# Patient Record
Sex: Male | Born: 1960
Health system: Southern US, Community
[De-identification: ages and names within clinical notes are randomized; demographics above are authoritative.]

## PROBLEM LIST (undated history)

## (undated) DIAGNOSIS — C801 Malignant (primary) neoplasm, unspecified: Secondary | ICD-10-CM

## (undated) DIAGNOSIS — R0600 Dyspnea, unspecified: Secondary | ICD-10-CM

## (undated) DIAGNOSIS — E042 Nontoxic multinodular goiter: Secondary | ICD-10-CM

## (undated) DIAGNOSIS — E119 Type 2 diabetes mellitus without complications: Secondary | ICD-10-CM

## (undated) DIAGNOSIS — I82409 Acute embolism and thrombosis of unspecified deep veins of unspecified lower extremity: Secondary | ICD-10-CM

## (undated) DIAGNOSIS — E785 Hyperlipidemia, unspecified: Secondary | ICD-10-CM

## (undated) DIAGNOSIS — R600 Localized edema: Secondary | ICD-10-CM

## (undated) DIAGNOSIS — D751 Secondary polycythemia: Secondary | ICD-10-CM

## (undated) DIAGNOSIS — M1990 Unspecified osteoarthritis, unspecified site: Secondary | ICD-10-CM

## (undated) DIAGNOSIS — I87009 Postthrombotic syndrome without complications of unspecified extremity: Secondary | ICD-10-CM

## (undated) DIAGNOSIS — I10 Essential (primary) hypertension: Secondary | ICD-10-CM

## (undated) DIAGNOSIS — D6861 Antiphospholipid syndrome: Secondary | ICD-10-CM

## (undated) HISTORY — DX: Hyperlipidemia, unspecified: E78.5

## (undated) HISTORY — PX: EYE MUSCLE SURGERY: SHX370

## (undated) MED FILL — Dexamethasone Sodium Phosphate Inj 100 MG/10ML: INTRAMUSCULAR | Qty: 1 | Status: AC

## (undated) MED FILL — Fosaprepitant Dimeglumine For IV Infusion 150 MG (Base Eq): INTRAVENOUS | Qty: 5 | Status: AC

---

## 1999-02-02 ENCOUNTER — Encounter: Payer: Self-pay | Admitting: Emergency Medicine

## 1999-02-02 ENCOUNTER — Inpatient Hospital Stay (HOSPITAL_COMMUNITY): Admission: EM | Admit: 1999-02-02 | Discharge: 1999-02-09 | Payer: Self-pay | Admitting: Emergency Medicine

## 1999-02-04 ENCOUNTER — Encounter: Payer: Self-pay | Admitting: Pulmonary Disease

## 1999-02-06 ENCOUNTER — Encounter: Payer: Self-pay | Admitting: Pulmonary Disease

## 1999-02-16 ENCOUNTER — Emergency Department (HOSPITAL_COMMUNITY): Admission: EM | Admit: 1999-02-16 | Discharge: 1999-02-16 | Payer: Self-pay | Admitting: Internal Medicine

## 1999-02-16 ENCOUNTER — Encounter: Payer: Self-pay | Admitting: Internal Medicine

## 1999-03-24 ENCOUNTER — Ambulatory Visit (HOSPITAL_COMMUNITY): Admission: RE | Admit: 1999-03-24 | Discharge: 1999-03-24 | Payer: Self-pay | Admitting: Gastroenterology

## 1999-03-24 ENCOUNTER — Encounter: Payer: Self-pay | Admitting: Gastroenterology

## 1999-04-04 ENCOUNTER — Ambulatory Visit (HOSPITAL_COMMUNITY): Admission: RE | Admit: 1999-04-04 | Discharge: 1999-04-04 | Payer: Self-pay | Admitting: Gastroenterology

## 1999-04-04 ENCOUNTER — Encounter: Payer: Self-pay | Admitting: Gastroenterology

## 1999-10-12 ENCOUNTER — Inpatient Hospital Stay (HOSPITAL_COMMUNITY): Admission: EM | Admit: 1999-10-12 | Discharge: 1999-10-15 | Payer: Self-pay | Admitting: Emergency Medicine

## 1999-10-14 ENCOUNTER — Encounter: Payer: Self-pay | Admitting: Internal Medicine

## 2001-01-01 ENCOUNTER — Observation Stay (HOSPITAL_COMMUNITY): Admission: RE | Admit: 2001-01-01 | Discharge: 2001-01-02 | Payer: Self-pay | Admitting: Internal Medicine

## 2001-03-20 ENCOUNTER — Encounter: Admission: RE | Admit: 2001-03-20 | Discharge: 2001-05-28 | Payer: Self-pay | Admitting: Unknown Physician Specialty

## 2001-08-05 ENCOUNTER — Ambulatory Visit (HOSPITAL_COMMUNITY): Admission: RE | Admit: 2001-08-05 | Discharge: 2001-08-05 | Payer: Self-pay | Admitting: Internal Medicine

## 2004-09-19 ENCOUNTER — Ambulatory Visit: Payer: Self-pay | Admitting: Internal Medicine

## 2004-10-10 ENCOUNTER — Ambulatory Visit: Payer: Self-pay | Admitting: Cardiology

## 2004-10-11 ENCOUNTER — Ambulatory Visit: Payer: Self-pay | Admitting: Internal Medicine

## 2004-12-06 ENCOUNTER — Ambulatory Visit: Payer: Self-pay | Admitting: Internal Medicine

## 2005-01-25 ENCOUNTER — Ambulatory Visit: Payer: Self-pay | Admitting: Internal Medicine

## 2005-02-22 ENCOUNTER — Ambulatory Visit: Payer: Self-pay | Admitting: Cardiology

## 2005-03-22 ENCOUNTER — Ambulatory Visit: Payer: Self-pay | Admitting: Cardiology

## 2005-08-09 ENCOUNTER — Ambulatory Visit: Payer: Self-pay | Admitting: Cardiology

## 2005-09-18 ENCOUNTER — Ambulatory Visit: Payer: Self-pay | Admitting: Cardiology

## 2008-11-18 DIAGNOSIS — K2100 Gastro-esophageal reflux disease with esophagitis, without bleeding: Secondary | ICD-10-CM | POA: Insufficient documentation

## 2008-11-24 ENCOUNTER — Ambulatory Visit: Payer: Self-pay | Admitting: Family Medicine

## 2008-11-24 DIAGNOSIS — E785 Hyperlipidemia, unspecified: Secondary | ICD-10-CM | POA: Insufficient documentation

## 2008-11-24 DIAGNOSIS — Z8709 Personal history of other diseases of the respiratory system: Secondary | ICD-10-CM | POA: Insufficient documentation

## 2008-11-26 ENCOUNTER — Ambulatory Visit: Payer: Self-pay | Admitting: Family Medicine

## 2008-11-26 LAB — CONVERTED CEMR LAB
ALT: 44 units/L (ref 0–53)
AST: 28 units/L (ref 0–37)
Albumin: 3.9 g/dL (ref 3.5–5.2)
Alkaline Phosphatase: 42 units/L (ref 39–117)
BUN: 9 mg/dL (ref 6–23)
Basophils Absolute: 0 10*3/uL (ref 0.0–0.1)
Basophils Relative: 0.1 % (ref 0.0–3.0)
Bilirubin Urine: NEGATIVE
Bilirubin, Direct: 0.1 mg/dL (ref 0.0–0.3)
Blood in Urine, dipstick: NEGATIVE
CO2: 31 meq/L (ref 19–32)
Calcium: 8.9 mg/dL (ref 8.4–10.5)
Chloride: 104 meq/L (ref 96–112)
Cholesterol: 167 mg/dL (ref 0–200)
Creatinine, Ser: 1.1 mg/dL (ref 0.4–1.5)
Eosinophils Absolute: 0 10*3/uL (ref 0.0–0.7)
Eosinophils Relative: 1.5 % (ref 0.0–5.0)
GFR calc Af Amer: 92 mL/min
GFR calc non Af Amer: 76 mL/min
Glucose, Bld: 115 mg/dL — ABNORMAL HIGH (ref 70–99)
Glucose, Urine, Semiquant: NEGATIVE
HCT: 47.5 % (ref 39.0–52.0)
HDL: 58.8 mg/dL (ref >39.0)
Hemoglobin: 16.4 g/dL (ref 13.0–17.0)
Ketones, urine, test strip: NEGATIVE
LDL Cholesterol: 87 mg/dL (ref 0–99)
Lymphocytes Relative: 17.9 % (ref 12.0–46.0)
MCHC: 34.6 g/dL (ref 30.0–36.0)
MCV: 91.1 fL (ref 78.0–100.0)
Monocytes Absolute: 0.4 10*3/uL (ref 0.1–1.0)
Monocytes Relative: 11.7 % (ref 3.0–12.0)
Neutro Abs: 2.1 10*3/uL (ref 1.4–7.7)
Neutrophils Relative %: 68.8 % (ref 43.0–77.0)
Nitrite: NEGATIVE
PSA: 0.84 ng/mL (ref 0.10–4.00)
Platelets: 180 10*3/uL (ref 150–400)
Potassium: 4.4 meq/L (ref 3.5–5.1)
Protein, U semiquant: NEGATIVE
RBC: 5.21 M/uL (ref 4.22–5.81)
RDW: 12 % (ref 11.5–14.6)
Sodium: 143 meq/L (ref 135–145)
Specific Gravity, Urine: 1.015
TSH: 1.13 microintl units/mL (ref 0.35–5.50)
Total Bilirubin: 0.7 mg/dL (ref 0.3–1.2)
Total CHOL/HDL Ratio: 2.8
Total Protein: 6.4 g/dL (ref 6.0–8.3)
Triglycerides: 108 mg/dL (ref 0–149)
Urobilinogen, UA: 0.2
VLDL: 22 mg/dL (ref 0–40)
WBC Urine, dipstick: NEGATIVE
WBC: 3 10*3/uL — ABNORMAL LOW (ref 4.5–10.5)
pH: 6

## 2008-12-08 ENCOUNTER — Ambulatory Visit: Payer: Self-pay | Admitting: Family Medicine

## 2008-12-08 DIAGNOSIS — I872 Venous insufficiency (chronic) (peripheral): Secondary | ICD-10-CM | POA: Insufficient documentation

## 2008-12-08 DIAGNOSIS — F528 Other sexual dysfunction not due to a substance or known physiological condition: Secondary | ICD-10-CM | POA: Insufficient documentation

## 2008-12-08 DIAGNOSIS — Z8619 Personal history of other infectious and parasitic diseases: Secondary | ICD-10-CM | POA: Insufficient documentation

## 2008-12-15 ENCOUNTER — Encounter: Payer: Self-pay | Admitting: Gastroenterology

## 2008-12-22 ENCOUNTER — Ambulatory Visit: Payer: Self-pay | Admitting: Gastroenterology

## 2009-01-19 ENCOUNTER — Telehealth: Payer: Self-pay | Admitting: Family Medicine

## 2009-05-04 ENCOUNTER — Telehealth (INDEPENDENT_AMBULATORY_CARE_PROVIDER_SITE_OTHER): Payer: Self-pay | Admitting: *Deleted

## 2009-09-14 ENCOUNTER — Ambulatory Visit: Payer: Self-pay | Admitting: Family Medicine

## 2009-09-14 DIAGNOSIS — L259 Unspecified contact dermatitis, unspecified cause: Secondary | ICD-10-CM | POA: Insufficient documentation

## 2009-11-23 ENCOUNTER — Ambulatory Visit: Payer: Self-pay | Admitting: Family Medicine

## 2009-11-23 LAB — CONVERTED CEMR LAB
ALT: 54 units/L — ABNORMAL HIGH (ref 0–53)
Albumin: 3.9 g/dL (ref 3.5–5.2)
Basophils Relative: 0.1 % (ref 0.0–3.0)
Bilirubin, Direct: 0.1 mg/dL (ref 0.0–0.3)
Blood in Urine, dipstick: NEGATIVE
CO2: 31 meq/L (ref 19–32)
Chloride: 104 meq/L (ref 96–112)
Cholesterol: 182 mg/dL (ref 0–200)
Eosinophils Relative: 1.9 % (ref 0.0–5.0)
Glucose, Urine, Semiquant: NEGATIVE
HCT: 49.3 % (ref 39.0–52.0)
Hemoglobin: 16 g/dL (ref 13.0–17.0)
LDL Cholesterol: 86 mg/dL (ref 0–99)
Lymphs Abs: 0.5 10*3/uL — ABNORMAL LOW (ref 0.7–4.0)
MCHC: 32.5 g/dL (ref 30.0–36.0)
MCV: 95.6 fL (ref 78.0–100.0)
Monocytes Absolute: 0.4 10*3/uL (ref 0.1–1.0)
Neutro Abs: 2.1 10*3/uL (ref 1.4–7.7)
Nitrite: NEGATIVE
PSA: 1.16 ng/mL (ref 0.10–4.00)
Potassium: 4.4 meq/L (ref 3.5–5.1)
Protein, U semiquant: NEGATIVE
RBC: 5.16 M/uL (ref 4.22–5.81)
Specific Gravity, Urine: 1.015
TSH: 1.18 microintl units/mL (ref 0.35–5.50)
Total CHOL/HDL Ratio: 2
Total Protein: 6.8 g/dL (ref 6.0–8.3)
Urobilinogen, UA: 0.2
WBC Urine, dipstick: NEGATIVE
WBC: 3.1 10*3/uL — ABNORMAL LOW (ref 4.5–10.5)
pH: 6

## 2009-12-13 ENCOUNTER — Ambulatory Visit: Payer: Self-pay | Admitting: Family Medicine

## 2010-02-02 ENCOUNTER — Encounter: Payer: Self-pay | Admitting: Family Medicine

## 2010-10-12 ENCOUNTER — Telehealth: Payer: Self-pay | Admitting: Family Medicine

## 2010-11-28 NOTE — Assessment & Plan Note (Signed)
Summary: cpx   Vital Signs:  Patient profile:   50 year old male Height:      74.5 inches Weight:      294 pounds Temp:     98.8 degrees F oral BP sitting:   130 / 80  (left arm) Cuff size:   regular  Vitals Entered By: Christopher Marsh CMA Christopher Marsh) (December 13, 2009 8:33 AM)  Reason for Visit cpx  History of Present Illness: Christopher Marsh divorce, smoker, who comes in today for evaluation of a few issues.  He has underlying hyperlipidemia, for which he takes pravastatin 20 mg daily.  Lipids are goal with a total cholesterol 182, HDL 74, LDL 86.  He also uses hydrochlorothiazide 25 mg q.a.m. for venous insufficiency.  His history of blood clots in his legs.  He also wears a full leg stocking.  He has mild erectile dysfunction for which he uses Cialis 20 mg p.r.n.  He also has eczema for which he uses triamcinolone ointment .1% p.r.n.  He also has a history of intermittent asthma, usually triggered by a viral infection.  He uses one canister a Proventil and a 12 month period of time.  Does not get routine eye care, but does get routine dental care.  Tetanus 2010 seasonal flu 2010.  He is due for colonoscopy.  Will refer to Dr. Jarold Marsh, who also did his evaluation and treatment for hepatitis C.........his blood work for his physical examination was on   The Monday after the Super Bowl he had been to parties.  Both Saturday and Sunday, probably why his liver enzymes jumped up a little bit  Allergies: No Known Drug Allergies  Past History:  Past medical, surgical, family and social histories (including risk factors) reviewed, and no changes noted (except as noted below).  Past Medical History: Reviewed history from 12/08/2008 and no changes required. Pneumonia, hx of phlebitis dvt  left leg post phlebitic swelling Hyperlipidemia erectile dysfunction history of hepatitis C, successfully treated by Dr. Jarold Marsh with interferon  Family History: Reviewed history from  11/24/2008 and no changes required. Father: deceased - suicide, emphysema Mother: breast cancer Siblings: brother - throat cancer  Social History: Reviewed history from 11/24/2008 and no changes required. Occupation: Airline pilot Divorced Former Smoker Alcohol use-yes Regular exercise-no went to [employer] and [employer]  Review of Systems      See HPI  Physical Exam  General:  Well-developed,well-nourished,in no acute distress; alert,appropriate and cooperative throughout examination Head:  Normocephalic and atraumatic without obvious abnormalities. No apparent alopecia or balding. Eyes:  No corneal or conjunctival inflammation noted. EOMI. Perrla. Funduscopic exam benign, without hemorrhages, exudates or papilledema. Vision grossly normal. Ears:  External ear exam shows no significant lesions or deformities.  Otoscopic examination reveals clear canals, tympanic membranes are intact bilaterally without bulging, retraction, inflammation or discharge. Hearing is grossly normal bilaterally. Nose:  External nasal examination shows no deformity or inflammation. Nasal mucosa are pink and moist without lesions or exudates. Mouth:  Oral mucosa and oropharynx without lesions or exudates.  Teeth in good repair. Neck:  No deformities, masses, or tenderness noted. Chest Wall:  No deformities, masses, tenderness or gynecomastia noted. Breasts:  No masses or gynecomastia noted Lungs:  Normal respiratory effort, chest expands symmetrically. Lungs are clear to auscultation, no crackles or wheezes. Heart:  Normal rate and regular rhythm. S1 and S2 normal without gallop, murmur, click, rub or other extra sounds. Abdomen:  Bowel sounds positive,abdomen soft and non-tender without masses, organomegaly or hernias  noted. Rectal:  No external abnormalities noted. Normal sphincter tone. No rectal masses or tenderness. Genitalia:  Testes bilaterally descended without nodularity, tenderness or masses. No scrotal  masses or lesions. No penis lesions or urethral discharge. Prostate:  Prostate gland firm and smooth, no enlargement, nodularity, tenderness, mass, asymmetry or induration. Msk:  No deformity or scoliosis noted of thoracic or lumbar spine.   Pulses:  R and L carotid,radial,femoral,dorsalis pedis and posterior tibial pulses are full and equal bilaterally Extremities:  No clubbing, cyanosis, edema, or deformity noted with normal full range of motion of all joints.  1+ left pedal edema and 1+ right pedal edema.   Neurologic:  No cranial nerve deficits noted. Station and gait are normal. Plantar reflexes are down-going bilaterally. DTRs are symmetrical throughout. Sensory, motor and coordinative functions appear intact. Skin:  Intact without suspicious lesions or rashes Cervical Nodes:  No lymphadenopathy noted Axillary Nodes:  No palpable lymphadenopathy Inguinal Nodes:  No significant adenopathy Psych:  Cognition and judgment appear intact. Alert and cooperative with normal attention span and concentration. No apparent delusions, illusions, hallucinations   Impression & Recommendations:  Problem # 1:  CONTACT DERMATITIS&OTHER ECZEMA DUE UNSPEC CAUSE (ICD-692.9) Assessment Improved  His updated medication list for this problem includes:    Triamcinolone Acetonide 0.1 % Oint (Triamcinolone acetonide) .Marland Kitchen... Tac 0.1/ tac 0.1 oint :1 120 cm  apply two times a day  Orders: Prescription Created Electronically [phone])  Problem # 2:  ERECTILE DYSFUNCTION (ICD-302.72) Assessment: Improved  His updated medication list for this problem includes:    Cialis 20 Mg Tabs (Tadalafil) .Marland Kitchen... Take one tablet at bedtime  Orders: Prescription Created Electronically [phone])  Problem # 3:  VENOUS INSUFFICIENCY, LEFT LEG (ICD-459.81) Assessment: Unchanged  Orders: Prescription Created Electronically [phone])  Problem # 4:  HYPERLIPIDEMIA (ICD-272.4) Assessment: Improved  His updated medication list for  this problem includes:    Pravachol 20 Mg Tabs (Pravastatin sodium) .Marland Kitchen... Take one tablet once daily  Orders: Prescription Created Electronically [phone]) EKG w/ Interpretation (93000)  Complete Medication List: 1)  Pravachol 20 Mg Tabs (Pravastatin sodium) .... Take one tablet once daily 2)  Cialis 20 Mg Tabs (Tadalafil) .... Take one tablet at bedtime 3)  Hydrochlorothiazide 25 Mg Tabs (Hydrochlorothiazide) .... Take 1 tablet by mouth every morning 4)  Truform Stockings 20-62mmhg Misc (Elastic bandages & supports) .... Left full leg stocking hx of dvt code 459.81 5)  Triamcinolone Acetonide 0.1 % Oint (Triamcinolone acetonide) .... Tac 0.1/ tac 0.1 oint :1 120 cm  apply two times a day 6)  Proventil Hfa 108 (90 Base) Mcg/act Aers (Albuterol sulfate) .... Use as directed  Other Orders: Gastroenterology Referral (GI)  Patient Instructions: 1)  Please schedule a follow-up appointment in 1 year. 2)  It is important that you exercise regularly at least 20 minutes 5 times a week. If you develop chest pain, have severe difficulty breathing, or feel very tired , stop exercising immediately and seek medical attention. 3)  You need to lose weight. Consider a lower calorie diet and regular exercise.  4)  Schedule a colonoscopy/sigmoidoscopy to help detect colon cancer. 5)  Take an Aspirin every day. Prescriptions: PROVENTIL HFA 108 (90 BASE) MCG/ACT AERS (ALBUTEROL SULFATE) Use as directed  #1 x 1   Entered and Authorized by:   Christopher Pee MD   Signed by:   Christopher Pee MD on 12/13/2009   Method used:   Electronically to        [facility]* (retail)       [address]       [place], [place]  [id number]       Ph: [phone]       Fax: [phone]   RxID:   [id number] TRIAMCINOLONE ACETONIDE 0.1 % OINT (TRIAMCINOLONE ACETONIDE) TAC 0.1/ TAC 0.1 OINT :1 120 CM  apply two times a day  #1 x 3   Entered and Authorized by:   Christopher Pee MD   Signed by:   Christopher Pee MD on 12/13/2009   Method used:   Electronically to        [facility]* (retail)       [address]       [place], [place]  [id number]       Ph: [phone]       Fax: [phone]   RxID:   [id number] HYDROCHLOROTHIAZIDE 25 MG TABS (HYDROCHLOROTHIAZIDE) Take 1 tablet by mouth every morning  #100 x 3   Entered and Authorized by:   Christopher Pee MD   Signed by:   Christopher Pee MD on 12/13/2009   Method used:   Electronically to        [facility]* (retail)       [address]       [place], [place]  [address]       Ph: [phone]       Fax: [phone]   RxID:   [id number] CIALIS 20 MG TABS (TADALAFIL) take one tablet at bedtime  #6 x 11   Entered and Authorized by:   Christopher Pee MD   Signed by:   Christopher Pee MD on 12/13/2009   Method used:   Electronically to        [facility]* (retail)       [address]       [place], [place]  [address]       Ph: [phone]       Fax: [phone]   RxID:   [id number] PRAVACHOL 20 MG TABS (PRAVASTATIN SODIUM) take one tablet once daily  #100 x 3   Entered and Authorized by:   Christopher Pee MD   Signed by:   Christopher Pee MD on 12/13/2009   Method used:   Electronically to        [facility]* (retail)       [address]       [place], [place]  [address]       Ph: [phone]       Fax: [phone]   RxID:   [id number]

## 2010-11-28 NOTE — Letter (Signed)
Summary: Referral - not able to see patient  [facility]  [address] [place], [place] [address]   Phone: [phone]  Fax: [phone]    February 02, 2010   Kelle Darting, M.D. [address] [place], [place] [address]   Re:   Christopher Marsh DOB:  Jul 20, 1961 MRN:   [id number]    Dear Dr. Tawanna Cooler:  Thank you for your kind referral of the above patient.  We have attempted to schedule the recommended procedure Screening Colonoscopy but have not been able to schedule because:   X  The patient was not available by phone and/or has not returned our calls.  ___ The patient declined to schedule the procedure at this time.  We appreciate the referral and hope that we will have the opportunity to treat this patient in the future.    Sincerely,    [facility] [phone]

## 2010-11-30 NOTE — Progress Notes (Signed)
Summary: [facility]  Phone Note Call from Patient   Summary of Call: pt calls stating he had discussed with dr todd about hip pain and was told to take motrin which didnt really help- has been taking moms tramadol and pain has subsided--request script for tramadol  Initial call taken by: Willy Eddy, LPN,  October 12, 2010 12:50 PM  Follow-up for Phone Call        if y  pain is not controlled by Motrin, then I would recommend you see an orthopedist for a complete evaluation......... he may call [facility].......... used my name........ and they will give him in promptly for an evaluation Follow-up by: Roderick Pee MD,  October 12, 2010 2:35 PM  Additional Follow-up for Phone Call Additional follow up Details #1::        Phone Call Completed Additional Follow-up by: Kern Reap CMA Duncan Dull),  October 13, 2010 10:53 AM

## 2011-01-10 ENCOUNTER — Other Ambulatory Visit: Payer: Self-pay | Admitting: Family Medicine

## 2011-02-19 ENCOUNTER — Other Ambulatory Visit: Payer: Self-pay | Admitting: Family Medicine

## 2011-02-19 NOTE — Telephone Encounter (Signed)
Time for an office visit 

## 2011-03-16 NOTE — Discharge Summary (Signed)
[facility]  Patient:    Christopher Marsh, Christopher Marsh                     MRN: [id number] Adm. Date:  [id number] Disc. Date: [id number] Attending:  Dorena Cookey Dictator:   Cornell Barman, P.A. CC:         Titus Dubin. Alwyn Ren, M.D. [facility]   Discharge Summary  DISCHARGE DIAGNOSIS:  Left lower extremity deep venous thrombosis.  HISTORY OF PRESENT ILLNESS:  Christopher Marsh is a 50 year old, white male who one week ago developed a knot sensation in the skin and thought maybe this was a bite.  This has progressed to left lower extremity swelling.  The patient has no history of a DVT.  The patient does not have any recent history of air travel, but does spend three to four hours a day traveling in a car.  LABORATORY DATA AND X-RAY FINDINGS:  The patient had lower extremity venous Dopplers that revealed an extensive DVT coursing through the calf veins from the ankle through the popliteal vein and into the distal superficial femoral vein.  There was also superficial thrombosis of the lesser saphenous vein on the left.  HOSPITAL COURSE:  #1 - LEFT LOWER EXTREMITY DEEP VENOUS THROMBOSIS:  The patient was admitted for anticoagulation with Lovenox and Coumadin.  Pharmacy did evaluate the patient and made recommendations for his Lovenox and Coumadin.  The patient will be discharged home today.  He has already been scheduled to follow up with Dr. Baldo Daub tomorrow, January 03, 2001.  At that time, we will schedule a protime and INR.  #2 - LABORATORY DATA:  We do not have any coagulations available at the time of this dictation.  DISCHARGE MEDICATIONS: 1. Lovenox 130 mg subcu b.i.d. 2. Coumadin, dose to be determined.  The patient has received 2 mg so far.    This was given on January 01, 2001.  The pharmacist will dose Coumadin at    discharge today and will have Dr. Baldo Daub followup and write a    prescription for Coumadin tomorrow, January 03, 2001. DD:  01/02/01 TD:   01/03/01 Job: [id number]

## 2012-03-17 ENCOUNTER — Other Ambulatory Visit (INDEPENDENT_AMBULATORY_CARE_PROVIDER_SITE_OTHER): Payer: BC Managed Care – PPO

## 2012-03-17 DIAGNOSIS — Z0000 Encounter for general adult medical examination without abnormal findings: Secondary | ICD-10-CM

## 2012-03-17 LAB — CBC WITH DIFFERENTIAL/PLATELET
Basophils Absolute: 0 10*3/uL (ref 0.0–0.1)
Basophils Relative: 0.6 % (ref 0.0–3.0)
Eosinophils Absolute: 0.1 10*3/uL (ref 0.0–0.7)
Lymphocytes Relative: 10.3 % — ABNORMAL LOW (ref 12.0–46.0)
MCHC: 33.7 g/dL (ref 30.0–36.0)
Neutrophils Relative %: 78 % — ABNORMAL HIGH (ref 43.0–77.0)
RBC: 5.36 Mil/uL (ref 4.22–5.81)

## 2012-03-17 LAB — POCT URINALYSIS DIPSTICK
Bilirubin, UA: NEGATIVE
Glucose, UA: NEGATIVE
Leukocytes, UA: NEGATIVE
Nitrite, UA: NEGATIVE
Urobilinogen, UA: 0.2

## 2012-03-17 LAB — BASIC METABOLIC PANEL
CO2: 27 mEq/L (ref 19–32)
Calcium: 9 mg/dL (ref 8.4–10.5)
Creatinine, Ser: 0.8 mg/dL (ref 0.4–1.5)
GFR: 103.09 mL/min (ref >60.00)

## 2012-03-17 LAB — HEPATIC FUNCTION PANEL
Alkaline Phosphatase: 50 U/L (ref 39–117)
Bilirubin, Direct: 0.2 mg/dL (ref 0.0–0.3)
Total Protein: 6.6 g/dL (ref 6.0–8.3)

## 2012-03-17 LAB — LIPID PANEL
HDL: 64.2 mg/dL (ref >39.00)
LDL Cholesterol: 121 mg/dL — ABNORMAL HIGH (ref 0–99)
Total CHOL/HDL Ratio: 3
Triglycerides: 76 mg/dL (ref 0.0–149.0)

## 2012-03-18 LAB — PSA: PSA: 1.62 ng/mL (ref 0.10–4.00)

## 2012-04-02 ENCOUNTER — Ambulatory Visit (INDEPENDENT_AMBULATORY_CARE_PROVIDER_SITE_OTHER): Payer: BC Managed Care – PPO | Admitting: Family Medicine

## 2012-04-02 ENCOUNTER — Encounter: Payer: Self-pay | Admitting: Family Medicine

## 2012-04-02 VITALS — BP 140/90 | Temp 98.0°F | Ht 75.0 in | Wt 301.0 lb

## 2012-04-02 DIAGNOSIS — F528 Other sexual dysfunction not due to a substance or known physiological condition: Secondary | ICD-10-CM

## 2012-04-02 DIAGNOSIS — B009 Herpesviral infection, unspecified: Secondary | ICD-10-CM

## 2012-04-02 DIAGNOSIS — Z0000 Encounter for general adult medical examination without abnormal findings: Secondary | ICD-10-CM

## 2012-04-02 DIAGNOSIS — J45909 Unspecified asthma, uncomplicated: Secondary | ICD-10-CM

## 2012-04-02 DIAGNOSIS — L259 Unspecified contact dermatitis, unspecified cause: Secondary | ICD-10-CM

## 2012-04-02 DIAGNOSIS — M25552 Pain in left hip: Secondary | ICD-10-CM

## 2012-04-02 DIAGNOSIS — E785 Hyperlipidemia, unspecified: Secondary | ICD-10-CM

## 2012-04-02 DIAGNOSIS — J45998 Other asthma: Secondary | ICD-10-CM

## 2012-04-02 DIAGNOSIS — M25559 Pain in unspecified hip: Secondary | ICD-10-CM

## 2012-04-02 MED ORDER — TADALAFIL 20 MG PO TABS: 20.0000 mg | ORAL_TABLET | Freq: Every day | ORAL | Status: DC | PRN

## 2012-04-02 MED ORDER — PRAVASTATIN SODIUM 20 MG PO TABS: 20.0000 mg | ORAL_TABLET | Freq: Every day | ORAL | Status: DC

## 2012-04-02 MED ORDER — TRIAMCINOLONE ACETONIDE 0.025 % EX CREA: 1.0000 "application " | TOPICAL_CREAM | Freq: Two times a day (BID) | CUTANEOUS | Status: DC

## 2012-04-02 MED ORDER — TRAMADOL HCL 50 MG PO TABS: 50.0000 mg | ORAL_TABLET | Freq: Four times a day (QID) | ORAL | Status: DC | PRN

## 2012-04-02 MED ORDER — VALACYCLOVIR HCL 1 G PO TABS: 1000.0000 mg | ORAL_TABLET | Freq: Two times a day (BID) | ORAL | Status: DC

## 2012-04-02 MED ORDER — ALBUTEROL SULFATE HFA 108 (90 BASE) MCG/ACT IN AERS: 2.0000 | INHALATION_SPRAY | Freq: Four times a day (QID) | RESPIRATORY_TRACT | Status: DC | PRN

## 2012-04-02 NOTE — Patient Instructions (Signed)
Take the Pravachol and aspirin daily  Triamcinolone cream when necessary  600 mg of Motrin prior to playing golf tramadol when necessary and consult with Dr. Durene Romans in [facility] about the pain in your left hip  Valtrex and albuterol when necessary  Return in one year sooner if any problems  Purchased a full leg stockings for your venous insufficiency  Continue the diet exercise and weight loss  Remember to wear sunscreens SPF 50

## 2012-04-02 NOTE — Progress Notes (Signed)
  Subjective:    Patient ID: Christopher Marsh, male    DOB: [birth year], 51 y.o.   MRN: [id number]  HPI Roseanne Reno is a 51 year old married male nonsmoker who comes in today for general physical examination  He's History of erectile dysfunction he uses Cialis when necessary  He is a history of hyperlipidemia he takes Pravachol and aspirin tablet daily. He's been off his medicine for about 8 months. An LDL in the 120 range advised to restart his Pravachol  He uses triamcinolone cream for eczema  He takes tramadol 50 mg when necessary for left hip pain. Advised to see Dr. Susy Frizzle oh for consult  He takes Valtrex when necessary and albuterol when necessary  Does not get routine eye care refer to Dr. Carlus Pavlov regular dental care in need of a colonoscopy screening tetanus 2010   Review of Systems  Constitutional: Negative.   HENT: Negative.   Eyes: Negative.   Respiratory: Negative.   Cardiovascular: Negative.   Gastrointestinal: Negative.   Genitourinary: Negative.   Musculoskeletal: Negative.   Skin: Negative.   Neurological: Negative.   Hematological: Negative.   Psychiatric/Behavioral: Negative.        Objective:   Physical Exam  Constitutional: He is oriented to person, place, and time. He appears well-developed and well-nourished.  HENT:  Head: Normocephalic and atraumatic.  Right Ear: External ear normal.  Left Ear: External ear normal.  Nose: Nose normal.  Mouth/Throat: Oropharynx is clear and moist.  Eyes: Conjunctivae and EOM are normal. Pupils are equal, round, and reactive to light.  Neck: Normal range of motion. Neck supple. No JVD present. No tracheal deviation present. No thyromegaly present.  Cardiovascular: Normal rate, regular rhythm, normal heart sounds and intact distal pulses.  Exam reveals no gallop and no friction rub.   No murmur heard. Pulmonary/Chest: Effort normal and breath sounds normal. No stridor. No respiratory distress. He has no wheezes. He  has no rales. He exhibits no tenderness.  Abdominal: Soft. Bowel sounds are normal. He exhibits no distension and no mass. There is no tenderness. There is no rebound and no guarding.  Genitourinary: Rectum normal, prostate normal and penis normal. Guaiac negative stool. No penile tenderness.  Musculoskeletal: Normal range of motion. He exhibits no edema and no tenderness.  Lymphadenopathy:    He has no cervical adenopathy.  Neurological: He is alert and oriented to person, place, and time. He has normal reflexes. No cranial nerve deficit. He exhibits normal muscle tone.  Skin: Skin is warm and dry. No rash noted. No erythema. No pallor.       Total body skin exam normal he has a garden variety of freckles and skin tags and capillary hemangiomas. He has light skin and light eyes advised to wear sunscreens daily because he plays golf  1+ edema right leg 2+ left  Psychiatric: He has a normal mood and affect. His behavior is normal. Judgment and thought content normal.          Assessment & Plan:  Healthy male  Hyperlipidemia resume Pravachol and aspirin  Erectile dysfunction Cialis when necessary  Eczema triamcinolone cream when necessary  Left hip pain 600 mg of Motrin prior to playing golf tramadol when necessary consult with Dr. Molli Hazard o  History of oral HSV Valtrex when necessary  History of asthma albuterol when necessary  Venous insufficiency

## 2012-05-14 ENCOUNTER — Other Ambulatory Visit: Payer: Self-pay | Admitting: *Deleted

## 2012-05-14 DIAGNOSIS — L259 Unspecified contact dermatitis, unspecified cause: Secondary | ICD-10-CM

## 2012-05-14 MED ORDER — TRIAMCINOLONE ACETONIDE 0.025 % EX CREA: 1.0000 "application " | TOPICAL_CREAM | Freq: Two times a day (BID) | CUTANEOUS | Status: DC

## 2012-12-19 ENCOUNTER — Other Ambulatory Visit: Payer: Self-pay | Admitting: Family Medicine

## 2013-01-27 ENCOUNTER — Other Ambulatory Visit: Payer: Self-pay | Admitting: Family Medicine

## 2013-04-26 ENCOUNTER — Other Ambulatory Visit: Payer: Self-pay | Admitting: Family Medicine

## 2013-05-09 ENCOUNTER — Other Ambulatory Visit: Payer: Self-pay | Admitting: Family Medicine

## 2013-06-10 ENCOUNTER — Other Ambulatory Visit (INDEPENDENT_AMBULATORY_CARE_PROVIDER_SITE_OTHER): Payer: BC Managed Care – PPO

## 2013-06-10 DIAGNOSIS — Z0000 Encounter for general adult medical examination without abnormal findings: Secondary | ICD-10-CM

## 2013-06-10 LAB — CBC WITH DIFFERENTIAL/PLATELET
Basophils Relative: 0.8 % (ref 0.0–3.0)
Eosinophils Absolute: 0.1 10*3/uL (ref 0.0–0.7)
Eosinophils Relative: 1.6 % (ref 0.0–5.0)
Hemoglobin: 16.5 g/dL (ref 13.0–17.0)
Lymphocytes Relative: 17.7 % (ref 12.0–46.0)
MCHC: 33.9 g/dL (ref 30.0–36.0)
MCV: 92.4 fl (ref 78.0–100.0)
Neutro Abs: 3.1 10*3/uL (ref 1.4–7.7)
Neutrophils Relative %: 69.2 % (ref 43.0–77.0)
RBC: 5.26 Mil/uL (ref 4.22–5.81)
WBC: 4.5 10*3/uL (ref 4.5–10.5)

## 2013-06-10 LAB — POCT URINALYSIS DIPSTICK
Glucose, UA: NEGATIVE
Leukocytes, UA: NEGATIVE
Nitrite, UA: NEGATIVE
Protein, UA: NEGATIVE
Spec Grav, UA: 1.02
Urobilinogen, UA: 0.2

## 2013-06-10 LAB — HEPATIC FUNCTION PANEL
Bilirubin, Direct: 0.1 mg/dL (ref 0.0–0.3)
Total Bilirubin: 0.9 mg/dL (ref 0.3–1.2)

## 2013-06-10 LAB — LIPID PANEL
LDL Cholesterol: 100 mg/dL — ABNORMAL HIGH (ref 0–99)
Total CHOL/HDL Ratio: 3
VLDL: 13.6 mg/dL (ref 0.0–40.0)

## 2013-06-10 LAB — BASIC METABOLIC PANEL
BUN: 9 mg/dL (ref 6–23)
CO2: 28 mEq/L (ref 19–32)
Chloride: 101 mEq/L (ref 96–112)
Creatinine, Ser: 0.8 mg/dL (ref 0.4–1.5)
Glucose, Bld: 115 mg/dL — ABNORMAL HIGH (ref 70–99)
Potassium: 4.4 mEq/L (ref 3.5–5.1)

## 2013-06-10 LAB — PSA: PSA: 1.29 ng/mL (ref 0.10–4.00)

## 2013-06-17 ENCOUNTER — Encounter: Payer: BC Managed Care – PPO | Admitting: Family Medicine

## 2013-06-18 ENCOUNTER — Encounter: Payer: Self-pay | Admitting: Internal Medicine

## 2013-06-18 ENCOUNTER — Ambulatory Visit (INDEPENDENT_AMBULATORY_CARE_PROVIDER_SITE_OTHER): Payer: BC Managed Care – PPO | Admitting: Internal Medicine

## 2013-06-18 VITALS — BP 130/80 | HR 80 | Temp 98.3°F | Ht 75.0 in | Wt 313.0 lb

## 2013-06-18 DIAGNOSIS — Z0000 Encounter for general adult medical examination without abnormal findings: Secondary | ICD-10-CM

## 2013-06-18 DIAGNOSIS — I872 Venous insufficiency (chronic) (peripheral): Secondary | ICD-10-CM

## 2013-06-18 DIAGNOSIS — Z8619 Personal history of other infectious and parasitic diseases: Secondary | ICD-10-CM

## 2013-06-18 DIAGNOSIS — F528 Other sexual dysfunction not due to a substance or known physiological condition: Secondary | ICD-10-CM

## 2013-06-18 MED ORDER — PRAVASTATIN SODIUM 20 MG PO TABS: ORAL_TABLET | ORAL | Status: DC

## 2013-06-18 MED ORDER — TADALAFIL 20 MG PO TABS: 20.0000 mg | ORAL_TABLET | Freq: Every day | ORAL | Status: DC | PRN

## 2013-06-18 NOTE — Progress Notes (Signed)
  Subjective:    Patient ID: Christopher Marsh, male    DOB: 1960-11-11, 52 y.o.   MRN: [id number]  HPI 52 year old patient who is seen today for a preventive health examination. Medical problems include chronic venous insufficiency involving the left leg. He has chronic swelling controlled with compression hose but no significant pain. Has history of mild stable asthma as well as a history of dyslipidemia. No new concerns or complaints  Social history divorced nonsmoker since age 1. Family history mother is a history of breast cancer Father died age 7 complications of COPD. One brother died of throat cancer he was a smoker.      Review of Systems  Constitutional: Negative for fever, chills, activity change, appetite change and fatigue.  HENT: Negative for hearing loss, ear pain, congestion, rhinorrhea, sneezing, mouth sores, trouble swallowing, neck pain, neck stiffness, dental problem, voice change, sinus pressure and tinnitus.   Eyes: Negative for photophobia, pain, redness and visual disturbance.  Respiratory: Negative for apnea, cough, choking, chest tightness, shortness of breath and wheezing.   Cardiovascular: Positive for leg swelling. Negative for chest pain and palpitations.  Gastrointestinal: Negative for nausea, vomiting, abdominal pain, diarrhea, constipation, blood in stool, abdominal distention, anal bleeding and rectal pain.  Genitourinary: Negative for dysuria, urgency, frequency, hematuria, flank pain, decreased urine volume, discharge, penile swelling, scrotal swelling, difficulty urinating, genital sores and testicular pain.  Musculoskeletal: Negative for myalgias, back pain, joint swelling, arthralgias and gait problem.  Skin: Negative for color change, rash and wound.  Neurological: Negative for dizziness, tremors, seizures, syncope, facial asymmetry, speech difficulty, weakness, light-headedness, numbness and headaches.  Hematological: Negative for adenopathy. Does not  bruise/bleed easily.  Psychiatric/Behavioral: Negative for suicidal ideas, hallucinations, behavioral problems, confusion, sleep disturbance, self-injury, dysphoric mood, decreased concentration and agitation. The patient is not nervous/anxious.        Objective:   Physical Exam  Constitutional: He appears well-developed and well-nourished.  HENT:  Head: Normocephalic and atraumatic.  Right Ear: External ear normal.  Left Ear: External ear normal.  Nose: Nose normal.  Mouth/Throat: Oropharynx is clear and moist.  Eyes: Conjunctivae and EOM are normal. Pupils are equal, round, and reactive to light. No scleral icterus.  Neck: Normal range of motion. Neck supple. No JVD present. No thyromegaly present.  Cardiovascular: Regular rhythm, normal heart sounds and intact distal pulses.  Exam reveals no gallop and no friction rub.   No murmur heard. Pulmonary/Chest: Effort normal and breath sounds normal. He exhibits no tenderness.  Abdominal: Soft. Bowel sounds are normal. He exhibits no distension and no mass. There is no tenderness.  Genitourinary: Prostate normal and penis normal. Guaiac negative stool.  Musculoskeletal: Normal range of motion. He exhibits no edema and no tenderness.  Chronic edema and his chances left leg  Lymphadenopathy:    He has no cervical adenopathy.  Neurological: He is alert. He has normal reflexes. No cranial nerve deficit. Coordination normal.  Skin: Skin is warm and dry. No rash noted.  Psychiatric: He has a normal mood and affect. His behavior is normal.          Assessment & Plan:  Preventive health examination Morbid obesity Chronic venous insufficiency left leg History of asthma stable Dyslipidemia. Continue pravastatin  Weight loss encouraged Heart healthy low-salt diet recommended Followup 1 year Home blood pressure monitoring encouraged

## 2013-06-18 NOTE — Patient Instructions (Addendum)
Limit your sodium (Salt) intake    It is important that you exercise regularly, at least 20 minutes 3 to 4 times per week.  If you develop chest pain or shortness of breath seek  medical attention.  You need to lose weight.  Consider a lower calorie diet and regular exercise.Venous Stasis and Chronic Venous Insufficiency As people age, the veins located in their legs may weaken and stretch. When veins weaken and lose the ability to pump blood effectively, the condition is called chronic venous insufficiency (CVI) or venous stasis. Almost all veins return blood back to the heart. This happens by:  The force of the heart pumping fresh blood pushes blood back to the heart.  Blood flowing to the heart from the force of gravity. In the deep veins of the legs, blood has to fight gravity and flow upstream back to the heart. Here, the leg muscles contract to pump blood back toward the heart. Vein walls are elastic, and many veins have small valves that only allow blood to flow in one direction. When leg muscles contract, they push inward against the elastic vein walls. This squeezes blood upward, opens the valves, and moves blood toward the heart. When leg muscles relax, the vein wall also relaxes and the valves inside the vein close to prevent blood from flowing backward. This method of pumping blood out of the legs is called the venous pump. CAUSES  The venous pump works best while walking and leg muscles are contracting. But when a person sits or stands, blood pressure in leg veins can build. Deep veins are usually able to withstand short periods of inactivity, but long periods of inactivity (and increased pressure) can stretch, weaken, and damage vein walls. High blood pressure can also stretch and damage vein walls. The veins may no longer be able to pump blood back to the heart. Venous hypertension (high blood pressure inside veins) that lasts over time is a primary cause of CVI. CVI can also be caused by:    Deep vein thrombosis, a condition where a thrombus (blood clot) blocks blood flow in a vein.  Phlebitis, an inflammation of a superficial vein that causes a blood clot to form. Other risk factors for CVI may include:   Heredity.  Obesity.  Pregnancy.  Sedentary lifestyle.  Smoking.  Jobs requiring long periods of standing or sitting in one place.  Age and gender:  Women in their 88's and 64's and men in their 87's are more prone to developing CVI. SYMPTOMS  Symptoms of CVI may include:   Varicose veins.  Ulceration or skin breakdown.  Lipodermatosclerosis, a condition that affects the skin just above the ankle, usually on the inside surface. Over time the skin becomes brown, smooth, tight and often painful. Those with this condition have a high risk of developing skin ulcers.  Reddened or discolored skin on the leg.  Swelling. DIAGNOSIS  Your caregiver can diagnose CVI after performing a careful medical history and physical examination. To confirm the diagnosis, the following tests may also be ordered:   Duplex ultrasound.  Plethysmography (tests blood flow).  Venograms (x-ray using a special dye). TREATMENT The goals of treatment for CVI are to restore a person to an active life and to minimize pain or disability. Typically, CVI does not pose a serious threat to life or limb, and with proper treatment most people with this condition can continue to lead active lives. In most cases, mild CVI can be treated on an outpatient  basis with simple procedures. Treatment methods include:   Elastic compression socks.  Sclerotherapy, a procedure involving an injection of a material that "dissolves" the damaged veins. Other veins in the network of blood vessels take over the function of the damaged veins.  Vein stripping (an older procedure less commonly used).  Laser Ablation surgery.  Valve repair. HOME CARE INSTRUCTIONS   Elastic compression socks must be worn every  day. They can help with symptoms and lower the chances of the problem getting worse, but they do not cure the problem.  Only take over-the-counter or prescription medicines for pain, discomfort, or fever as directed by your caregiver.  Your caregiver will review your other medications with you. SEEK MEDICAL CARE IF:   You are confused about how to take your medications.  There is redness, swelling, or increasing pain in the affected area.  There is a red streak or line that extends up or down from the affected area.  There is a breakdown or loss of skin in the affected area, even if the breakdown is small.  You develop an unexplained oral temperature above 102 F (38.9 C).  There is an injury to the affected area. SEEK IMMEDIATE MEDICAL CARE IF:   There is an injury and open wound to the affected area.  Pain is not adequately relieved with pain medication prescribed or becomes severe.  An oral temperature above 102 F (38.9 C) develops.  The foot/ankle below the affected area becomes suddenly numb or the area feels weak and hard to move. MAKE SURE YOU:   Understand these instructions.  Will watch your condition.  Will get help right away if you are not doing well or get worse. Document Released: 02/18/2007 Document Revised: 01/07/2012 Document Reviewed: 04/28/2007 [facility] Patient Information 2014 [place], [place].

## 2013-07-27 ENCOUNTER — Other Ambulatory Visit: Payer: Self-pay | Admitting: *Deleted

## 2013-07-27 MED ORDER — TRAMADOL HCL 50 MG PO TABS: ORAL_TABLET | ORAL | Status: DC

## 2013-07-27 NOTE — Telephone Encounter (Signed)
Rx called in to pharmacy. 

## 2013-08-27 ENCOUNTER — Other Ambulatory Visit: Payer: Self-pay | Admitting: Family Medicine

## 2013-12-29 ENCOUNTER — Telehealth: Payer: Self-pay | Admitting: Family Medicine

## 2013-12-29 NOTE — Telephone Encounter (Signed)
[place], [place] - [address] requesting refill of traMADol (ULTRAM) 50 MG tablet

## 2013-12-30 MED ORDER — TRAMADOL HCL 50 MG PO TABS: ORAL_TABLET | ORAL | Status: DC

## 2013-12-30 NOTE — Telephone Encounter (Signed)
Rx called in 

## 2014-05-14 ENCOUNTER — Ambulatory Visit (INDEPENDENT_AMBULATORY_CARE_PROVIDER_SITE_OTHER): Payer: No Typology Code available for payment source | Admitting: Physician Assistant

## 2014-05-14 ENCOUNTER — Ambulatory Visit (INDEPENDENT_AMBULATORY_CARE_PROVIDER_SITE_OTHER)
Admission: RE | Admit: 2014-05-14 | Discharge: 2014-05-14 | Disposition: A | Discharge status: Home / Self Care | Payer: No Typology Code available for payment source | Source: Ambulatory Visit | Attending: Physician Assistant | Admitting: Physician Assistant

## 2014-05-14 ENCOUNTER — Encounter: Payer: Self-pay | Admitting: Physician Assistant

## 2014-05-14 VITALS — BP 132/80 | HR 72 | Temp 99.7°F | Resp 18 | Wt 307.0 lb

## 2014-05-14 DIAGNOSIS — J0100 Acute maxillary sinusitis, unspecified: Secondary | ICD-10-CM

## 2014-05-14 MED ORDER — IOHEXOL 300 MG/ML  SOLN
75.0000 mL | Freq: Once | INTRAMUSCULAR | Status: AC | PRN
  Administered 2014-05-14: 75 mL via INTRAVENOUS

## 2014-05-14 MED ORDER — AMOXICILLIN-POT CLAVULANATE 875-125 MG PO TABS: 1.0000 | ORAL_TABLET | Freq: Two times a day (BID) | ORAL | Status: DC

## 2014-05-14 NOTE — Progress Notes (Signed)
Pre visit review using our clinic review tool, if applicable. No additional management support is needed unless otherwise documented below in the visit note. 

## 2014-05-14 NOTE — Progress Notes (Signed)
Subjective:    Patient ID: Christopher Marsh, male    DOB: 28-May-1961, 53 y.o.   MRN: [id number]  Sinusitis This is a new problem. The current episode started in the past 7 days. The problem has been rapidly worsening since onset. His pain is at a severity of 10/10. The pain is severe. Associated symptoms include chills, congestion, diaphoresis, headaches, a hoarse voice, sinus pressure and a sore throat. Pertinent negatives include no coughing, ear pain, neck pain, shortness of breath, sneezing or swollen glands. (Severe Tooth Pain) Past treatments include nothing.      Review of Systems  Constitutional: Positive for fever, chills, diaphoresis and appetite change.  HENT: Positive for congestion, facial swelling, hoarse voice, sinus pressure and sore throat. Negative for ear pain and sneezing.   Respiratory: Negative for cough and shortness of breath.   Cardiovascular: Negative for chest pain.  Gastrointestinal: Positive for nausea. Negative for vomiting, abdominal pain and diarrhea.  Musculoskeletal: Negative for neck pain.  Neurological: Positive for headaches.  All other systems reviewed and are negative.     History reviewed. No pertinent past medical history.  History   Social History  . Marital Status: Single    Spouse Name: N/A    Number of Children: N/A  . Years of Education: N/A   Occupational History  . Not on file.   Social History Main Topics  . Smoking status: Former Research scientist (life sciences)  . Smokeless tobacco: Not on file  . Alcohol Use: Yes  . Drug Use: No  . Sexual Activity:    Other Topics Concern  . Not on file   Social History Narrative  . No narrative on file    History reviewed. No pertinent past surgical history.  No family history on file.  No Known Allergies  Current Outpatient Prescriptions on File Prior to Visit  Medication Sig Dispense Refill  . pravastatin (PRAVACHOL) 20 MG tablet TAKE 1 TABLET ONCE DAILY.  30 tablet  0  . tadalafil (CIALIS) 20  MG tablet Take 1 tablet (20 mg total) by mouth daily as needed for erectile dysfunction.  12 tablet  6  . traMADol (ULTRAM) 50 MG tablet TAKE ONE TABLET EVERY 6 HOURS AS NEEDED.  90 tablet  1  . triamcinolone (KENALOG) 0.025 % cream Apply 1 application topically 2 (two) times daily.  30 g  6  . triamcinolone ointment (KENALOG) 0.1 % APPLY TWICE DAILY.  80 g  0   No current facility-administered medications on file prior to visit.    EXAM: BP 132/80  Pulse 72  Temp(Src) 99.7 F (37.6 C) (Oral)  Resp 18  Wt 307 lb (139.254 kg)     Objective:   Physical Exam  Nursing note and vitals reviewed. Constitutional: He is oriented to person, place, and time. He appears well-developed and well-nourished. No distress.  HENT:  Head: Normocephalic and atraumatic.  Right Ear: External ear normal.  Left Ear: External ear normal.  Nose: Nose normal.  Mouth/Throat: No oropharyngeal exudate.  Oropharynx is slightly erythematous, no exudate. Bilateral TMs normal. Bilateral frontal sinuses non-TTP. Left Max sinus non-ttp. Right max sinus exquisitely ttp. Difficult for pt to open mouth due to pain. Visible mild to moderate right sided facial swelling.  Eyes: Conjunctivae and EOM are normal. Pupils are equal, round, and reactive to light.  Neck: Normal range of motion. Neck supple.  Cardiovascular: Normal rate, regular rhythm and intact distal pulses.   Pulmonary/Chest: Effort normal and breath sounds normal. No stridor.  No respiratory distress. He exhibits no tenderness.  Musculoskeletal: Normal range of motion.  Lymphadenopathy:    He has no cervical adenopathy.  Neurological: He is alert and oriented to person, place, and time.  Skin: Skin is warm and dry. No rash noted. He is not diaphoretic. No erythema. No pallor.  Psychiatric: He has a normal mood and affect. His behavior is normal. Judgment and thought content normal.     Lab Results  Component Value Date   WBC 4.5 06/10/2013   HGB  16.5 06/10/2013   HCT 48.6 06/10/2013   PLT 195.0 06/10/2013   GLUCOSE 115* 06/10/2013   CHOL 178 06/10/2013   TRIG 68.0 06/10/2013   HDL 64.00 06/10/2013   LDLCALC 100* 06/10/2013   ALT 40 06/10/2013   AST 32 06/10/2013   NA 135 06/10/2013   K 4.4 06/10/2013   CL 101 06/10/2013   CREATININE 0.8 06/10/2013   BUN 9 06/10/2013   CO2 28 06/10/2013   TSH 1.61 06/10/2013   PSA 1.29 06/10/2013        Assessment & Plan:  Jaquin was seen today for sinusitis.  Diagnoses and associated orders for this visit:  Acute maxillary sinusitis, recurrence not specified Comments: Sudden onset severe symptoms, will Rx Augmentin, CT sinuses. - amoxicillin-clavulanate (AUGMENTIN) 875-125 MG per tablet; Take 1 tablet by mouth 2 (two) times daily. - CT Maxillofacial W/Cm; Future    Plan may change depending on results of CT.  Return precautions provided, and patient handout on sinusitis.  Plan to follow up as needed, or for worsening or persistent symptoms despite treatment.  Patient Instructions  Proceed directly to have your CT imaging done.   After that, you can pick up a prescription of Augmentin, twice daily for 10 days for sinusitis.  We will call you as soon as we have the results from your imaging.  If emergency symptoms discussed during visit developed, seek medical attention immediately.  Followup as needed, or for worsening or persistent symptoms despite treatment.

## 2014-05-14 NOTE — Patient Instructions (Addendum)
Proceed directly to have your CT imaging done.   After that, you can pick up a prescription of Augmentin, twice daily for 10 days for sinusitis.  We will call you as soon as we have the results from your imaging.  If emergency symptoms discussed during visit developed, seek medical attention immediately.  Followup as needed, or for worsening or persistent symptoms despite treatment.    Sinusitis Sinusitis is redness, soreness, and puffiness (inflammation) of the air pockets in the bones of your face (sinuses). The redness, soreness, and puffiness can cause air and mucus to get trapped in your sinuses. This can allow germs to grow and cause an infection.  HOME CARE   Drink enough fluids to keep your pee (urine) clear or pale yellow.  Use a humidifier in your home.  Run a hot shower to create steam in the bathroom. Sit in the bathroom with the door closed. Breathe in the steam 3-4 times a day.  Put a warm, moist washcloth on your face 3-4 times a day, or as told by your doctor.  Use salt water sprays (saline sprays) to wet the thick fluid in your nose. This can help the sinuses drain.  Only take medicine as told by your doctor. GET HELP RIGHT AWAY IF:   Your pain gets worse.  You have very bad headaches.  You are sick to your stomach (nauseous).  You throw up (vomit).  You are very sleepy (drowsy) all the time.  Your face is puffy (swollen).  Your vision changes.  You have a stiff neck.  You have trouble breathing. MAKE SURE YOU:   Understand these instructions.  Will watch your condition.  Will get help right away if you are not doing well or get worse. Document Released: 04/02/2008 Document Revised: 07/09/2012 Document Reviewed: 05/20/2012 [facility] Patient Information 2015 [place], [place]. This information is not intended to replace advice given to you by your health care provider. Make sure you discuss any questions you have with your health care provider.

## 2014-06-24 ENCOUNTER — Other Ambulatory Visit: Payer: Self-pay | Admitting: Internal Medicine

## 2014-07-20 ENCOUNTER — Telehealth: Payer: Self-pay | Admitting: Family Medicine

## 2014-07-20 MED ORDER — TRAMADOL HCL 50 MG PO TABS: ORAL_TABLET | ORAL | Status: DC

## 2014-07-20 NOTE — Telephone Encounter (Signed)
Rx called in to pharmacy. 

## 2014-07-20 NOTE — Telephone Encounter (Signed)
[facility] - [place], [place] - [address] Is requesting re-fill on traMADol (ULTRAM) 50 MG tablet °

## 2014-10-11 ENCOUNTER — Other Ambulatory Visit: Payer: Self-pay | Admitting: Internal Medicine

## 2014-11-09 ENCOUNTER — Other Ambulatory Visit: Payer: Self-pay | Admitting: Family Medicine

## 2014-11-16 ENCOUNTER — Telehealth: Payer: Self-pay | Admitting: Family Medicine

## 2014-11-16 MED ORDER — TRAMADOL HCL 50 MG PO TABS: ORAL_TABLET | ORAL | Status: DC

## 2014-11-16 NOTE — Telephone Encounter (Signed)
Pt request refill of the following: traMADol (ULTRAM) 50 MG tablet  Pt has an appt scheduled for March 2016    Phamacy: [facility]

## 2014-11-16 NOTE — Telephone Encounter (Signed)
Rx called in 

## 2015-01-03 ENCOUNTER — Other Ambulatory Visit (INDEPENDENT_AMBULATORY_CARE_PROVIDER_SITE_OTHER): Payer: No Typology Code available for payment source

## 2015-01-03 DIAGNOSIS — Z0000 Encounter for general adult medical examination without abnormal findings: Secondary | ICD-10-CM

## 2015-01-03 LAB — PSA: PSA: 1.5 ng/mL (ref 0.10–4.00)

## 2015-01-03 LAB — LIPID PANEL
Cholesterol: 152 mg/dL (ref 0–200)
HDL: 62.2 mg/dL (ref >39.00)
LDL Cholesterol: 75 mg/dL (ref 0–99)
NonHDL: 89.8
Total CHOL/HDL Ratio: 2
Triglycerides: 74 mg/dL (ref 0.0–149.0)
VLDL: 14.8 mg/dL (ref 0.0–40.0)

## 2015-01-03 LAB — CBC WITH DIFFERENTIAL/PLATELET
BASOS PCT: 0.6 % (ref 0.0–3.0)
Basophils Absolute: 0 10*3/uL (ref 0.0–0.1)
EOS PCT: 1.1 % (ref 0.0–5.0)
Eosinophils Absolute: 0.1 10*3/uL (ref 0.0–0.7)
HCT: 47.8 % (ref 39.0–52.0)
Hemoglobin: 16.3 g/dL (ref 13.0–17.0)
LYMPHS ABS: 0.7 10*3/uL (ref 0.7–4.0)
Lymphocytes Relative: 13.8 % (ref 12.0–46.0)
MCHC: 34 g/dL (ref 30.0–36.0)
MCV: 89.6 fl (ref 78.0–100.0)
MONO ABS: 0.5 10*3/uL (ref 0.1–1.0)
Monocytes Relative: 10.1 % (ref 3.0–12.0)
Neutro Abs: 4 10*3/uL (ref 1.4–7.7)
Neutrophils Relative %: 74.4 % (ref 43.0–77.0)
Platelets: 207 10*3/uL (ref 150.0–400.0)
RBC: 5.34 Mil/uL (ref 4.22–5.81)
RDW: 13.1 % (ref 11.5–15.5)
WBC: 5.3 10*3/uL (ref 4.0–10.5)

## 2015-01-03 LAB — POCT URINALYSIS DIPSTICK
Bilirubin, UA: NEGATIVE
GLUCOSE UA: NEGATIVE
Ketones, UA: NEGATIVE
Leukocytes, UA: NEGATIVE
Nitrite, UA: NEGATIVE
Protein, UA: NEGATIVE
RBC UA: NEGATIVE
SPEC GRAV UA: 1.02
UROBILINOGEN UA: 1
pH, UA: 6

## 2015-01-03 LAB — BASIC METABOLIC PANEL
BUN: 8 mg/dL (ref 6–23)
CALCIUM: 8.9 mg/dL (ref 8.4–10.5)
CO2: 31 mEq/L (ref 19–32)
CREATININE: 0.92 mg/dL (ref 0.40–1.50)
Chloride: 101 mEq/L (ref 96–112)
GFR: 90.59 mL/min (ref >60.00)
Glucose, Bld: 147 mg/dL — ABNORMAL HIGH (ref 70–99)
Potassium: 4.9 mEq/L (ref 3.5–5.1)
Sodium: 137 mEq/L (ref 135–145)

## 2015-01-03 LAB — HEPATIC FUNCTION PANEL
ALBUMIN: 4.1 g/dL (ref 3.5–5.2)
ALT: 26 U/L (ref 0–53)
AST: 19 U/L (ref 0–37)
Alkaline Phosphatase: 62 U/L (ref 39–117)
Bilirubin, Direct: 0.2 mg/dL (ref 0.0–0.3)
TOTAL PROTEIN: 6.4 g/dL (ref 6.0–8.3)
Total Bilirubin: 0.9 mg/dL (ref 0.2–1.2)

## 2015-01-03 LAB — TSH: TSH: 1.32 u[IU]/mL (ref 0.35–4.50)

## 2015-01-07 ENCOUNTER — Ambulatory Visit (INDEPENDENT_AMBULATORY_CARE_PROVIDER_SITE_OTHER): Payer: No Typology Code available for payment source | Admitting: Internal Medicine

## 2015-01-07 ENCOUNTER — Encounter: Payer: Self-pay | Admitting: Internal Medicine

## 2015-01-07 VITALS — BP 160/62 | Temp 98.1°F | Wt 321.0 lb

## 2015-01-07 DIAGNOSIS — R600 Localized edema: Secondary | ICD-10-CM

## 2015-01-07 DIAGNOSIS — I872 Venous insufficiency (chronic) (peripheral): Secondary | ICD-10-CM

## 2015-01-07 MED ORDER — RIVAROXABAN 15 MG PO TABS: 15.0000 mg | ORAL_TABLET | Freq: Two times a day (BID) | ORAL | Status: DC

## 2015-01-07 NOTE — Progress Notes (Signed)
Subjective:    Patient ID: Christopher Marsh, male    DOB: [birth year], 54 y.o.   MRN: [id number]  HPI 54 year old patient who has a remote history of DVT and postphlebitic syndrome.  For the past couple days he has had swelling involving his distal right leg and ankle.  There has been no trauma or unusual activities.  Denies any pulmonary complaints. He states that following his first episode of left leg DVT.  He was on Coumadin anticoagulation for about 3 years  No past medical history on file.  History   Social History  . Marital Status: Single    Spouse Name: N/A  . Number of Children: N/A  . Years of Education: N/A   Occupational History  . Not on file.   Social History Main Topics  . Smoking status: Former Research scientist (life sciences)  . Smokeless tobacco: Not on file  . Alcohol Use: Yes  . Drug Use: No  . Sexual Activity: Not on file   Other Topics Concern  . Not on file   Social History Narrative    No past surgical history on file.  No family history on file.  No Known Allergies  Current Outpatient Prescriptions on File Prior to Visit  Medication Sig Dispense Refill  . pravastatin (PRAVACHOL) 20 MG tablet TAKE 1 TABLET ONCE DAILY. 30 tablet 2  . tadalafil (CIALIS) 20 MG tablet Take 1 tablet (20 mg total) by mouth daily as needed for erectile dysfunction. 12 tablet 6  . triamcinolone (KENALOG) 0.025 % cream Apply 1 application topically 2 (two) times daily. 30 g 6  . triamcinolone ointment (KENALOG) 0.1 % APPLY TWICE DAILY. 80 g 0  . traMADol (ULTRAM) 50 MG tablet TAKE ONE TABLET EVERY 6 HOURS AS NEEDED. (Patient not taking: Reported on 01/07/2015) 90 tablet 0   No current facility-administered medications on file prior to visit.    BP 160/62 mmHg  Temp(Src) 98.1 F (36.7 C)  Wt 321 lb (145.605 kg)       Review of Systems  Constitutional: Negative for fever, chills, appetite change and fatigue.  HENT: Negative for congestion, dental problem, ear pain, hearing loss, sore  throat, tinnitus, trouble swallowing and voice change.   Eyes: Negative for pain, discharge and visual disturbance.  Respiratory: Negative for cough, chest tightness, wheezing and stridor.   Cardiovascular: Positive for leg swelling. Negative for chest pain and palpitations.  Gastrointestinal: Negative for nausea, vomiting, abdominal pain, diarrhea, constipation, blood in stool and abdominal distention.  Genitourinary: Negative for urgency, hematuria, flank pain, discharge, difficulty urinating and genital sores.  Musculoskeletal: Negative for myalgias, back pain, joint swelling, arthralgias, gait problem and neck stiffness.  Skin: Negative for rash.  Neurological: Negative for dizziness, syncope, speech difficulty, weakness, numbness and headaches.  Hematological: Negative for adenopathy. Does not bruise/bleed easily.  Psychiatric/Behavioral: Negative for behavioral problems and dysphoric mood. The patient is not nervous/anxious.        Objective:   Physical Exam  Constitutional: He appears well-developed and well-nourished. No distress.  Obese  Musculoskeletal: He exhibits edema. He exhibits no tenderness.  Chronic left lower leg edema with some stasis changes and varicosities The right ankle was swollen The right calf nontender, but tight and tense          Assessment & Plan:   History of left leg DVT New-onset right leg swelling.  Rule out right leg DVT. Due to high clinical suspicion for acute right leg DVT.  Will place on anticoagulation today.  Will obtain a venous Doppler study as soon as possible.  Patient is scheduled for a annual exam in 3 days He will report any clinical worsening chest pain or shortness of breath

## 2015-01-07 NOTE — Patient Instructions (Addendum)
Avoid aspirin, Advil, naproxen and all anti-inflammatory medications  Venous Doppler study of the right leg as discussed  Follow-up Monday as scheduled  Deep Vein Thrombosis A deep vein thrombosis (DVT) is a blood clot that develops in the deep, larger veins of the leg, arm, or pelvis. These are more dangerous than clots that might form in veins near the surface of the body. A DVT can lead to serious and even life-threatening complications if the clot breaks off and travels in the bloodstream to the lungs.  A DVT can damage the valves in your leg veins so that instead of flowing upward, the blood pools in the lower leg. This is called post-thrombotic syndrome, and it can result in pain, swelling, discoloration, and sores on the leg. CAUSES Usually, several things contribute to the formation of blood clots. Contributing factors include:  The flow of blood slows down.  The inside of the vein is damaged in some way.  You have a condition that makes blood clot more easily. RISK FACTORS Some people are more likely than others to develop blood clots. Risk factors include:   Smoking.  Being overweight (obese).  Sitting or lying still for a long time. This includes long-distance travel, paralysis, or recovery from an illness or surgery. Other factors that increase risk are:   Older age, especially over 32 years of age.  Having a family history of blood clots or if you have already had a blot clot.  Having major or lengthy surgery. This is especially true for surgery on the hip, knee, or belly (abdomen). Hip surgery is particularly high risk.  Having a long, thin tube (catheter) placed inside a vein during a medical procedure.  Breaking a hip or leg.  Having cancer or cancer treatment.  Pregnancy and childbirth.  Hormone changes make the blood clot more easily during pregnancy.  The fetus puts pressure on the veins of the pelvis.  There is a risk of injury to veins during delivery  or a caesarean delivery. The risk is highest just after childbirth.  Medicines containing the male hormone estrogen. This includes birth control pills and hormone replacement therapy.  Other circulation or heart problems.  SIGNS AND SYMPTOMS When a clot forms, it can either partially or totally block the blood flow in that vein. Symptoms of a DVT can include:  Swelling of the leg or arm, especially if one side is much worse.  Warmth and redness of the leg or arm, especially if one side is much worse.  Pain in an arm or leg. If the clot is in the leg, symptoms may be more noticeable or worse when standing or walking. The symptoms of a DVT that has traveled to the lungs (pulmonary embolism, PE) usually start suddenly and include:  Shortness of breath.  Coughing.  Coughing up blood or blood-tinged mucus.  Chest pain. The chest pain is often worse with deep breaths.  Rapid heartbeat. Anyone with these symptoms should get emergency medical treatment right away. Do not wait to see if the symptoms will go away. Call your local emergency services (911 in the [place] if you have these symptoms. Do not drive yourself to the hospital. DIAGNOSIS If a DVT is suspected, your health care provider will take a full medical history and perform a physical exam. Tests that also may be required include:  Blood tests, including studies of the clotting properties of the blood.  Ultrasound to see if you have clots in your legs or lungs.  X-rays to show the flow of blood when dye is injected into the veins (venogram).  Studies of your lungs if you have any chest symptoms. PREVENTION  Exercise the legs regularly. Take a brisk 30-minute walk every day.  Maintain a weight that is appropriate for your height.  Avoid sitting or lying in bed for long periods of time without moving your legs.  Women, particularly those over the age of 53 years, should consider the risks and benefits of taking estrogen  medicines, including birth control pills.  Do not smoke, especially if you take estrogen medicines.  Long-distance travel can increase your risk of DVT. You should exercise your legs by walking or pumping the muscles every hour.  Many of the risk factors above relate to situations that exist with hospitalization, either for illness, injury, or elective surgery. Prevention may include medical and nonmedical measures.  Your health care provider will assess you for the need for venous thromboembolism prevention when you are admitted to the hospital. If you are having surgery, your surgeon will assess you the day of or day after surgery. TREATMENT Once identified, a DVT can be treated. It can also be prevented in some circumstances. Once you have had a DVT, you may be at increased risk for a DVT in the future. The most common treatment for DVT is blood-thinning (anticoagulant) medicine, which reduces the blood's tendency to clot. Anticoagulants can stop new blood clots from forming and stop old clots from growing. They cannot dissolve existing clots. Your body does this by itself over time. Anticoagulants can be given by mouth, through an IV tube, or by injection. Your health care provider will determine the best program for you. Other medicines or treatments that may be used are:  Heparin or related medicines (low molecular weight heparin) are often the first treatment for a blood clot. They act quickly. However, they cannot be taken orally and must be given either in shot form or by IV tube.  Heparin can cause a fall in a component of blood that stops bleeding and forms blood clots (platelets). You will be monitored with blood tests to be sure this does not occur.  Warfarin is an anticoagulant that can be swallowed. It takes a few days to start working, so usually heparin or related medicines are used in combination. Once warfarin is working, heparin is usually stopped.  Factor Xa inhibitor  medicines, such as rivaroxaban and apixaban, also reduce blood clotting. These medicines are taken orally and can often be used without heparin or related medicines.  Less commonly, clot dissolving drugs (thrombolytics) are used to dissolve a DVT. They carry a high risk of bleeding, so they are used mainly in severe cases where your life or a part of your body is threatened.  Very rarely, a blood clot in the leg needs to be removed surgically.  If you are unable to take anticoagulants, your health care provider may arrange for you to have a filter placed in a main vein in your abdomen. This filter prevents clots from traveling to your lungs. HOME CARE INSTRUCTIONS  Take all medicines as directed by your health care provider.  Learn as much as you can about DVT.  Wear a medical alert bracelet or carry a medical alert card.  Ask your health care provider how soon you can go back to normal activities. It is important to stay active to prevent blood clots. If you are on anticoagulant medicine, avoid contact sports.  It is very  important to exercise. This is especially important while traveling, sitting, or standing for long periods of time. Exercise your legs by walking or by tightening and relaxing your leg muscles regularly. Take frequent walks.  You may need to wear compression stockings. These are tight elastic stockings that apply pressure to the lower legs. This pressure can help keep the blood in the legs from clotting. SEEK MEDICAL CARE IF:  You notice a rapid heartbeat.  You feel weaker or more tired than usual.  You feel faint.  You notice increased bruising.  You feel your symptoms are not getting better in the time expected.  You believe you are having side effects of medicine. SEEK IMMEDIATE MEDICAL CARE IF:  You have chest pain.  You have trouble breathing.  You have new or increased swelling or pain in one leg.  You cough up blood.  You notice blood in vomit, in  a bowel movement, or in urine. MAKE SURE YOU:  Understand these instructions.  Will watch your condition.  Will get help right away if you are not doing well or get worse. Document Released: 10/15/2005 Document Revised: 03/01/2014 Document Reviewed: 06/22/2013 [facility] Patient Information 2015 [place], [place]. This information is not intended to replace advice given to you by your health care provider. Make sure you discuss any questions you have with your health care provider.

## 2015-01-10 ENCOUNTER — Ambulatory Visit (HOSPITAL_COMMUNITY)
Admission: RE | Admit: 2015-01-10 | Discharge: 2015-01-10 | Disposition: A | Discharge status: Home / Self Care | Payer: No Typology Code available for payment source | Source: Ambulatory Visit | Attending: Cardiology | Admitting: Cardiology

## 2015-01-10 ENCOUNTER — Ambulatory Visit (INDEPENDENT_AMBULATORY_CARE_PROVIDER_SITE_OTHER): Payer: No Typology Code available for payment source | Admitting: Family Medicine

## 2015-01-10 ENCOUNTER — Encounter: Payer: Self-pay | Admitting: Family Medicine

## 2015-01-10 ENCOUNTER — Telehealth: Payer: Self-pay | Admitting: Family Medicine

## 2015-01-10 VITALS — BP 150/84 | Temp 98.5°F | Ht 74.5 in | Wt 328.0 lb

## 2015-01-10 DIAGNOSIS — R600 Localized edema: Secondary | ICD-10-CM | POA: Insufficient documentation

## 2015-01-10 DIAGNOSIS — M25552 Pain in left hip: Secondary | ICD-10-CM

## 2015-01-10 DIAGNOSIS — F528 Other sexual dysfunction not due to a substance or known physiological condition: Secondary | ICD-10-CM

## 2015-01-10 DIAGNOSIS — E139 Other specified diabetes mellitus without complications: Secondary | ICD-10-CM

## 2015-01-10 DIAGNOSIS — I872 Venous insufficiency (chronic) (peripheral): Secondary | ICD-10-CM

## 2015-01-10 DIAGNOSIS — Z0000 Encounter for general adult medical examination without abnormal findings: Secondary | ICD-10-CM

## 2015-01-10 DIAGNOSIS — E785 Hyperlipidemia, unspecified: Secondary | ICD-10-CM

## 2015-01-10 MED ORDER — TRAMADOL HCL 50 MG PO TABS: ORAL_TABLET | ORAL | Status: DC

## 2015-01-10 MED ORDER — TADALAFIL 20 MG PO TABS: 20.0000 mg | ORAL_TABLET | Freq: Every day | ORAL | Status: DC | PRN

## 2015-01-10 MED ORDER — FUROSEMIDE 20 MG PO TABS: 20.0000 mg | ORAL_TABLET | Freq: Every day | ORAL | Status: DC

## 2015-01-10 MED ORDER — PRAVASTATIN SODIUM 20 MG PO TABS: 20.0000 mg | ORAL_TABLET | Freq: Every day | ORAL | Status: DC

## 2015-01-10 NOTE — Progress Notes (Signed)
Pre visit review using our clinic review tool, if applicable. No additional management support is needed unless otherwise documented below in the visit note. 

## 2015-01-10 NOTE — Progress Notes (Signed)
Subjective:    Patient ID: Christopher Marsh, male    DOB: 10-Feb-1961, 54 y.o.   MRN: [id number]  HPI Christopher Marsh is a 54 year old married male nonsmoker who comes in today for general physical exam because of a history of hyperlipidemia, erectile dysfunction, chronic hip plain getting worse over the last 4-5 years, bilateral lower extremity edema  He saw Dr. [name] last Friday at the end of the day with swelling of his right lower extremity. He had a DVT in the left side in 2000. Dr. [name] start him on Zarrella toe ordered a scan which was done today and showed no clots.  He takes Pravachol 20 mg daily for hyperlipidemia, Cialis 20 mg when necessary for ED, tramadol 50 mg 1 tablet every 6-8 hours for hip pain  He tells me he's had hip pain now for for 5 years. Is getting worse. I think it's time to have him go see an orthopedist  Does not get routine eye care does get dental care, never had a colonoscopy  Weight 320 pounds he had his heaviest period a couple years ago Dr. Linna Darner gave him a carbohydrate free diet he lost 40 pounds   Review of Systems  Constitutional: Negative.   HENT: Negative.   Eyes: Negative.   Respiratory: Negative.   Cardiovascular: Negative.   Gastrointestinal: Negative.   Endocrine: Negative.   Genitourinary: Negative.   Musculoskeletal: Negative.   Skin: Negative.   Allergic/Immunologic: Negative.   Neurological: Negative.   Hematological: Negative.   Psychiatric/Behavioral: Negative.        Objective:   Physical Exam  Constitutional: He is oriented to person, place, and time. He appears well-developed and well-nourished.  HENT:  Head: Normocephalic and atraumatic.  Right Ear: External ear normal.  Left Ear: External ear normal.  Nose: Nose normal.  Mouth/Throat: Oropharynx is clear and moist.  Eyes: Conjunctivae and EOM are normal. Pupils are equal, round, and reactive to light.  Neck: Normal range of motion. Neck supple. No JVD present. No tracheal deviation  present. No thyromegaly present.  Cardiovascular: Normal rate, regular rhythm, normal heart sounds and intact distal pulses.  Exam reveals no gallop and no friction rub.   No murmur heard. 2+ pedal edema bilaterally he's wearing a cast high stocking on his left leg  Pulmonary/Chest: Effort normal and breath sounds normal. No stridor. No respiratory distress. He has no wheezes. He has no rales. He exhibits no tenderness.  Abdominal: Soft. Bowel sounds are normal. He exhibits no distension and no mass. There is no tenderness. There is no rebound and no guarding.  Genitourinary: Rectum normal, prostate normal and penis normal. Guaiac negative stool. No penile tenderness.  Musculoskeletal: Normal range of motion. He exhibits no edema or tenderness.  Marked decreased range of motion of right hip  Lymphadenopathy:    He has no cervical adenopathy.  Neurological: He is alert and oriented to person, place, and time. He has normal reflexes. No cranial nerve deficit. He exhibits normal muscle tone.  Skin: Skin is warm and dry. No rash noted. No erythema. No pallor.  Psychiatric: He has a normal mood and affect. His behavior is normal. Judgment and thought content normal.  Nursing note and vitals reviewed.         Assessment & Plan:  ,,,,obsity.........Marland Kitchen prescribed a carbohydrate free diet  Elevated blood sugar ,,,,,,,,, fasting 147 ,,,,,, by definition diabetes ,,,,,, diet exercise weight loss blood sugar Monday Wednesday Friday follow-up in 5 weeks  Venous insufficiency ,,,  thigh-high stockings ,,,, salt free diet ,,,,,,, Lasix 20 mg daily  Hyperlipidemia ,,,,, continue Pravachol and aspirin  Erectile dysfunction ,,, continue Cialis  Right hip pain,,,,,,,, continue tramadol consult with Dr. Durward Fortes  Erectile dysfunction continue Viagra or Cialis,,,,,

## 2015-01-10 NOTE — Telephone Encounter (Signed)
Pt scheduled for a stat  US  Venous lower  01-10-2015@11 :30 am this order has been  entered  incorrectly - please use Vas 1061  Need to be changed before pt arrives for his appt

## 2015-01-10 NOTE — Progress Notes (Signed)
Bilateral Lower Ext. Venous Duplex Completed. Negative for DVT or SVT. Bilateral venous insufficiency was noted in both legs. Oda Cogan, BS, RDMS, RVT

## 2015-01-10 NOTE — Patient Instructions (Addendum)
Carbohydrate free diet,,,,,,,, walk 30 minutes daily  Fasting blood sugar daily in the morning,,,,,,,,,,,,,, Monday Wednesday Friday  Return in 5 weeks for follow-up.......... when you return bring a record of all your blood sugar readings and the device  Thigh-high leg stockings,,,,,,, [facility] free diet  Lasix 20 mg daily  Consult with Dr. Joni Fears,,,,,,, orthopedist about your hip  You will be called from GI to get set up for your screening colonoscopy  Call Dr Bing Plume for routine eye exam

## 2015-01-11 LAB — HEMOGLOBIN A1C: Hgb A1c MFr Bld: 7.4 % — ABNORMAL HIGH (ref 4.6–6.5)

## 2015-02-07 ENCOUNTER — Telehealth: Payer: Self-pay

## 2015-02-07 MED ORDER — ACYCLOVIR 400 MG PO TABS
400.0000 mg | ORAL_TABLET | Freq: Three times a day (TID) | ORAL | Status: DC | PRN
Start: 2015-02-07 — End: 2017-11-18

## 2015-02-07 NOTE — Telephone Encounter (Signed)
[facility] refill request for ACYCLOVIR 400 MG TAB. Take 1 tab tid for 1 week prn for fever blisters

## 2015-02-15 ENCOUNTER — Ambulatory Visit (INDEPENDENT_AMBULATORY_CARE_PROVIDER_SITE_OTHER): Payer: No Typology Code available for payment source | Admitting: Family Medicine

## 2015-02-15 DIAGNOSIS — R030 Elevated blood-pressure reading, without diagnosis of hypertension: Secondary | ICD-10-CM

## 2015-02-15 DIAGNOSIS — E119 Type 2 diabetes mellitus without complications: Secondary | ICD-10-CM | POA: Insufficient documentation

## 2015-02-15 DIAGNOSIS — E1165 Type 2 diabetes mellitus with hyperglycemia: Secondary | ICD-10-CM | POA: Diagnosis not present

## 2015-02-15 DIAGNOSIS — IMO0002 Reserved for concepts with insufficient information to code with codable children: Secondary | ICD-10-CM | POA: Insufficient documentation

## 2015-02-15 DIAGNOSIS — E1169 Type 2 diabetes mellitus with other specified complication: Secondary | ICD-10-CM | POA: Insufficient documentation

## 2015-02-15 NOTE — Patient Instructions (Signed)
Continue diet exercise weight loss  Blood sugar check and blood pressure check once weekly in the morning  Follow-up A1c in July......... nonfasting  Apolonio Schneiders extension [phone] we'll call you the report

## 2015-02-15 NOTE — Progress Notes (Signed)
   Subjective:    Patient ID: Christopher Marsh, male    DOB: [birth year], 54 y.o.   MRN: [id number]  HPI Steroid is a 54 year old divorce male nonsmoker....... he has 2 children and he lives with a woman who has 3 children...Marland KitchenMarland KitchenMarland Kitchen who comes in today for follow-up of diabetes and hypertension  When we saw him last a month ago his blood pressure was elevated blood sugar was elevated A1c 7.4%. He started on a diet and exercise program. Exercise is limited because of pain in his left hip. He's lost 15 pounds blood pressures drop back to normal 140/80. He's on Lasix 20 mg Monday Wednesday Friday.  Blood sugar in the 11/18/1928 range  Weight today 313   Review of Systems Review of systems otherwise negative    Objective:   Physical Exam  Well-developed well-nourished overweight male no acute distress vital signs stable he is afebrile      Assessment & Plan:  Diabetes type 2........ improve control with diet exercise and weight loss.....Marland Kitchen continue the above follow-up A1c in July  Hypertension at goal on Lasix 20 mg Monday Wednesday Friday.......... BP check weekly follow-up of elevated

## 2015-02-15 NOTE — Progress Notes (Signed)
Pre visit review using our clinic review tool, if applicable. No additional management support is needed unless otherwise documented below in the visit note. 

## 2015-02-21 ENCOUNTER — Other Ambulatory Visit: Payer: Self-pay | Admitting: Family Medicine

## 2015-04-06 ENCOUNTER — Other Ambulatory Visit: Payer: Self-pay | Admitting: Family Medicine

## 2015-04-12 ENCOUNTER — Other Ambulatory Visit: Payer: Self-pay | Admitting: Family Medicine

## 2015-05-19 ENCOUNTER — Other Ambulatory Visit (INDEPENDENT_AMBULATORY_CARE_PROVIDER_SITE_OTHER): Payer: BLUE CROSS/BLUE SHIELD

## 2015-05-19 DIAGNOSIS — E119 Type 2 diabetes mellitus without complications: Secondary | ICD-10-CM

## 2015-05-19 DIAGNOSIS — I10 Essential (primary) hypertension: Secondary | ICD-10-CM

## 2015-05-19 LAB — MICROALBUMIN / CREATININE URINE RATIO
Creatinine,U: 228.1 mg/dL
Microalb Creat Ratio: 0.4 mg/g (ref 0.0–30.0)
Microalb, Ur: 1 mg/dL (ref 0.0–1.9)

## 2015-05-19 LAB — BASIC METABOLIC PANEL
BUN: 10 mg/dL (ref 6–23)
CHLORIDE: 103 meq/L (ref 96–112)
CO2: 28 mEq/L (ref 19–32)
Calcium: 9.4 mg/dL (ref 8.4–10.5)
Creatinine, Ser: 0.88 mg/dL (ref 0.40–1.50)
GFR: 95.22 mL/min (ref >60.00)
Glucose, Bld: 121 mg/dL — ABNORMAL HIGH (ref 70–99)
Potassium: 4.7 mEq/L (ref 3.5–5.1)
Sodium: 139 mEq/L (ref 135–145)

## 2015-05-19 LAB — HEMOGLOBIN A1C: Hgb A1c MFr Bld: 6.2 % (ref 4.6–6.5)

## 2015-07-11 ENCOUNTER — Other Ambulatory Visit: Payer: Self-pay | Admitting: Family Medicine

## 2015-10-05 ENCOUNTER — Other Ambulatory Visit: Payer: Self-pay | Admitting: Family Medicine

## 2015-10-20 ENCOUNTER — Telehealth: Payer: Self-pay | Admitting: Family Medicine

## 2015-10-20 DIAGNOSIS — F528 Other sexual dysfunction not due to a substance or known physiological condition: Secondary | ICD-10-CM

## 2015-10-20 MED ORDER — SILDENAFIL CITRATE 100 MG PO TABS: 50.0000 mg | ORAL_TABLET | Freq: Every day | ORAL | Status: DC | PRN

## 2015-10-20 NOTE — Telephone Encounter (Signed)
Pt's PA for Cialis was denied. He has changed insurances and Viagra is now the preferred product.

## 2015-10-20 NOTE — Telephone Encounter (Signed)
Left message on machine for patient to return my call 

## 2015-10-20 NOTE — Telephone Encounter (Signed)
Patient is aware and new rx sent.

## 2015-11-30 ENCOUNTER — Ambulatory Visit (INDEPENDENT_AMBULATORY_CARE_PROVIDER_SITE_OTHER): Payer: BLUE CROSS/BLUE SHIELD | Admitting: Family Medicine

## 2015-11-30 ENCOUNTER — Other Ambulatory Visit: Payer: Self-pay | Admitting: Family Medicine

## 2015-11-30 DIAGNOSIS — D229 Melanocytic nevi, unspecified: Secondary | ICD-10-CM

## 2015-11-30 DIAGNOSIS — L570 Actinic keratosis: Secondary | ICD-10-CM

## 2015-11-30 DIAGNOSIS — L918 Other hypertrophic disorders of the skin: Secondary | ICD-10-CM | POA: Diagnosis not present

## 2015-11-30 MED ORDER — SILDENAFIL CITRATE 20 MG PO TABS: ORAL_TABLET | ORAL | Status: DC

## 2015-11-30 MED ORDER — SILDENAFIL CITRATE 20 MG PO TABS: 20.0000 mg | ORAL_TABLET | Freq: Three times a day (TID) | ORAL | Status: DC

## 2015-11-30 NOTE — Patient Instructions (Signed)
Return when necessary   Within 2 weeks we'll call the report on the lesion removed from the right side of your shoulder

## 2015-11-30 NOTE — Progress Notes (Signed)
   Subjective:    Patient ID: Christopher Marsh "Christopher Marsh, male    DOB: 1961/03/24, 55 y.o.   MRN: [id number]  HPI  Christopher Marsh is a 55 year old male nonsmoker who comes in today for evaluation of 2 issues  He has a lesion on his back upper right shoulder this been increasing in size over the last couple weeks.   He has multiple skin tags in both axillary areas.   Review of Systems     Review of systems negative except he has light skin and light eyes Objective:   Physical Exam   well-developed well-nourished male no acute distress vital signs stable is afebrile   the lesion on his right shoulders is 8 mm x 8 mm elevated crusty........ Consistent with an actinic keratosis    the lesions in his armpits and one lesion on the right side of his neck for all skin tags   After informed consent the lesion on the right side of his upper back was anesthetized with 1% Xylocaine with epinephrine and removed with 3 mm margins. The base was cauterized. Band-Aid was applied. It was sent for pathologic analysis. Clinically it's an AK    the other lesions were skin tags......... The areas were cleaned with alcohol the skin tags were removed and the bases were cauterized. He tolerated the procedure no complications,,,,,,,,,,,, total #50      Assessment & Plan:   actinic keratosis right side of his back.........Marland Kitchen Removed as above   skin tags #50...........Marland Kitchen Removed as above

## 2016-01-13 ENCOUNTER — Other Ambulatory Visit: Payer: Self-pay | Admitting: Family Medicine

## 2016-04-11 ENCOUNTER — Other Ambulatory Visit: Payer: BLUE CROSS/BLUE SHIELD

## 2016-04-18 ENCOUNTER — Encounter: Payer: BLUE CROSS/BLUE SHIELD | Admitting: Family Medicine

## 2016-04-27 ENCOUNTER — Other Ambulatory Visit: Payer: Self-pay | Admitting: Family Medicine

## 2016-05-11 ENCOUNTER — Other Ambulatory Visit: Payer: Self-pay | Admitting: Family Medicine

## 2016-05-11 NOTE — Telephone Encounter (Signed)
Ok to refill x 1  

## 2016-05-11 NOTE — Telephone Encounter (Signed)
Pt requesting Tramadol 50 MG #90, 1refills. Last filled 10/05/2015. Patient last seen by PCP 11/30/2015. Okay to refill?

## 2016-06-12 ENCOUNTER — Other Ambulatory Visit: Payer: Self-pay | Admitting: Family Medicine

## 2016-06-12 ENCOUNTER — Other Ambulatory Visit: Payer: Self-pay | Admitting: Internal Medicine

## 2016-06-14 ENCOUNTER — Other Ambulatory Visit: Payer: Self-pay | Admitting: Internal Medicine

## 2016-06-26 DIAGNOSIS — M1612 Unilateral primary osteoarthritis, left hip: Secondary | ICD-10-CM | POA: Diagnosis not present

## 2016-06-26 DIAGNOSIS — M545 Low back pain: Secondary | ICD-10-CM | POA: Diagnosis not present

## 2016-07-05 DIAGNOSIS — M1612 Unilateral primary osteoarthritis, left hip: Secondary | ICD-10-CM | POA: Diagnosis not present

## 2016-07-17 ENCOUNTER — Other Ambulatory Visit: Payer: Self-pay | Admitting: Family Medicine

## 2016-08-10 ENCOUNTER — Other Ambulatory Visit (INDEPENDENT_AMBULATORY_CARE_PROVIDER_SITE_OTHER): Payer: BLUE CROSS/BLUE SHIELD

## 2016-08-10 DIAGNOSIS — Z0000 Encounter for general adult medical examination without abnormal findings: Secondary | ICD-10-CM | POA: Diagnosis not present

## 2016-08-10 LAB — CBC WITH DIFFERENTIAL/PLATELET
BASOS ABS: 0 10*3/uL (ref 0.0–0.1)
BASOS PCT: 0.7 % (ref 0.0–3.0)
Eosinophils Absolute: 0 10*3/uL (ref 0.0–0.7)
Eosinophils Relative: 0.8 % (ref 0.0–5.0)
HEMATOCRIT: 47.6 % (ref 39.0–52.0)
HEMOGLOBIN: 16.3 g/dL (ref 13.0–17.0)
LYMPHS PCT: 16.3 % (ref 12.0–46.0)
Lymphs Abs: 0.7 10*3/uL (ref 0.7–4.0)
MCHC: 34.2 g/dL (ref 30.0–36.0)
MCV: 92.1 fl (ref 78.0–100.0)
MONOS PCT: 11.5 % (ref 3.0–12.0)
Monocytes Absolute: 0.5 10*3/uL (ref 0.1–1.0)
Neutro Abs: 3.2 10*3/uL (ref 1.4–7.7)
Neutrophils Relative %: 70.7 % (ref 43.0–77.0)
Platelets: 202 10*3/uL (ref 150.0–400.0)
RBC: 5.16 Mil/uL (ref 4.22–5.81)
RDW: 13.8 % (ref 11.5–15.5)
WBC: 4.5 10*3/uL (ref 4.0–10.5)

## 2016-08-10 LAB — LIPID PANEL
CHOL/HDL RATIO: 3
Cholesterol: 179 mg/dL (ref 0–200)
HDL: 64.5 mg/dL (ref >39.00)
LDL Cholesterol: 100 mg/dL — ABNORMAL HIGH (ref 0–99)
NONHDL: 114.97
Triglycerides: 75 mg/dL (ref 0.0–149.0)
VLDL: 15 mg/dL (ref 0.0–40.0)

## 2016-08-10 LAB — POC URINALSYSI DIPSTICK (AUTOMATED)
Bilirubin, UA: NEGATIVE
Glucose, UA: NEGATIVE
KETONES UA: NEGATIVE
Leukocytes, UA: NEGATIVE
Nitrite, UA: NEGATIVE
PH UA: 7
PROTEIN UA: NEGATIVE
RBC UA: NEGATIVE
SPEC GRAV UA: 1.01
Urobilinogen, UA: 1

## 2016-08-10 LAB — HEPATIC FUNCTION PANEL
ALBUMIN: 4.2 g/dL (ref 3.5–5.2)
ALK PHOS: 49 U/L (ref 39–117)
ALT: 44 U/L (ref 0–53)
AST: 27 U/L (ref 0–37)
BILIRUBIN DIRECT: 0.2 mg/dL (ref 0.0–0.3)
TOTAL PROTEIN: 6.2 g/dL (ref 6.0–8.3)
Total Bilirubin: 0.8 mg/dL (ref 0.2–1.2)

## 2016-08-10 LAB — BASIC METABOLIC PANEL
BUN: 9 mg/dL (ref 6–23)
CALCIUM: 9.2 mg/dL (ref 8.4–10.5)
CHLORIDE: 101 meq/L (ref 96–112)
CO2: 29 meq/L (ref 19–32)
CREATININE: 0.82 mg/dL (ref 0.40–1.50)
GFR: 102.86 mL/min (ref >60.00)
Glucose, Bld: 140 mg/dL — ABNORMAL HIGH (ref 70–99)
Potassium: 4.5 mEq/L (ref 3.5–5.1)
SODIUM: 137 meq/L (ref 135–145)

## 2016-08-10 LAB — PSA: PSA: 2.17 ng/mL (ref 0.10–4.00)

## 2016-08-10 LAB — TSH: TSH: 1.2 u[IU]/mL (ref 0.35–4.50)

## 2016-08-13 ENCOUNTER — Other Ambulatory Visit: Payer: BLUE CROSS/BLUE SHIELD

## 2016-08-28 ENCOUNTER — Encounter: Payer: Self-pay | Admitting: Family Medicine

## 2016-08-28 ENCOUNTER — Ambulatory Visit (INDEPENDENT_AMBULATORY_CARE_PROVIDER_SITE_OTHER): Payer: BLUE CROSS/BLUE SHIELD | Admitting: Family Medicine

## 2016-08-28 VITALS — BP 200/90 | HR 73 | Temp 98.3°F | Wt 313.7 lb

## 2016-08-28 DIAGNOSIS — I872 Venous insufficiency (chronic) (peripheral): Secondary | ICD-10-CM

## 2016-08-28 DIAGNOSIS — I10 Essential (primary) hypertension: Secondary | ICD-10-CM

## 2016-08-28 DIAGNOSIS — M25552 Pain in left hip: Secondary | ICD-10-CM

## 2016-08-28 DIAGNOSIS — E7800 Pure hypercholesterolemia, unspecified: Secondary | ICD-10-CM

## 2016-08-28 DIAGNOSIS — Z23 Encounter for immunization: Secondary | ICD-10-CM | POA: Diagnosis not present

## 2016-08-28 DIAGNOSIS — F528 Other sexual dysfunction not due to a substance or known physiological condition: Secondary | ICD-10-CM

## 2016-08-28 DIAGNOSIS — E1310 Other specified diabetes mellitus with ketoacidosis without coma: Secondary | ICD-10-CM | POA: Diagnosis not present

## 2016-08-28 DIAGNOSIS — Z0000 Encounter for general adult medical examination without abnormal findings: Secondary | ICD-10-CM

## 2016-08-28 LAB — POCT GLYCOSYLATED HEMOGLOBIN (HGB A1C): HEMOGLOBIN A1C: 6.8

## 2016-08-28 MED ORDER — BENAZEPRIL HCL 20 MG PO TABS: 20.0000 mg | ORAL_TABLET | Freq: Every day | ORAL | 3 refills | Status: DC

## 2016-08-28 MED ORDER — FUROSEMIDE 20 MG PO TABS: 20.0000 mg | ORAL_TABLET | Freq: Every day | ORAL | 5 refills | Status: DC

## 2016-08-28 MED ORDER — METFORMIN HCL 500 MG PO TABS: 500.0000 mg | ORAL_TABLET | Freq: Every day | ORAL | 3 refills | Status: DC

## 2016-08-28 MED ORDER — TADALAFIL 20 MG PO TABS: 10.0000 mg | ORAL_TABLET | ORAL | 11 refills | Status: DC | PRN

## 2016-08-28 NOTE — Progress Notes (Signed)
Christopher Marsh is a 55 year old,,,,, recently remarried for the second time,,,,, nonsmoker who comes in today for general physical examination  He has a history of venous insufficiency he takes Lasix 20 mg 2-3 times weekly  He has a history of hyperlipidemia is on Pravachol and aspirin. On 20 mg of Pravachol his HDL 64 LDL 100  He uses Viagra and the branded in the generic product for ED however gives him a headache. Cialis is working well in the past with no headache.  He was given meloxicam 15 mg daily by his orthopedist because of hip pain. He went to see Christopher Marsh was told he has arthritis in his hip and had an injection in August. It helped for a while but his hip still bothering him so much she really can't walk.  His weight is unchanged 315 pounds  His blood pressure when he came in was 200/90 after physical exam repeat by me right arm sitting position 190/90. He therefore has the classic metabolic syndrome obesity hypertension diabetes and hyperlipidemia. Blood sugar 140 A1c 6.8%. Day  He does not get routine eye care. Recommended annual ophthalmology evaluation. He does get regular dental care. We send him for colonoscopy at age 72 declined to get it done because of cost. He now says his insurance will pay for hand like to get a colonoscopy.  He works for a [employer]. Recently got remarried he has 2 grown children she has 3 grown children.  Vaccinations he was given a Prevnar 11 and influenza today tetanus 2010.  He has spot on his back that was irritated. He's always infected and took friend's antibiotics. Is still bothering him.  Family history mother has hyperlipidemia dad died of suicide brother died from complications of drug abuse  14 point review of systems reviewed and negative except for above  Physical examination vital signs stable except for weight 313 pounds BMI is 39.66. BP 190/90 right arm sitting position  HEENT were negative neck was supple no adenopathy thyroid  normal no carotid bruits cardiopulmonary exam normal Christopher Marsh abnormal except for fairly large panniculus. Genitalia normal rectum normal stool guaiac negative prostate smooth 1+ nonnodular BPH extremities normal skin normal peripheral pulses normal except chronic venous insufficiency 2+ peripheral edema right and left lower extremity and the lesion on his back appears to be a seborrheic keratosis. Not infected.  #1 hyperlipidemia....... increase Pravachol to 40 mg daily LDL goal 75 or less  #2 hypertension........ start ACE inhibitor..... Follow-up in 2 weeks  #3 obesity........ stressed diet exercise and weight loss the Mediterranean diet  #4 diabetes,,,,,, Onset........Christopher Marsh Kitchen blood sugar 140...Christopher Marsh KitchenMarland KitchenMarland Kitchen A1c 6.8%. Begin metformin 500 mg daily......Christopher Marsh Kitchen begin home blood sugar monitoring follow-up in 2 weeks  #5 chronic venous insufficiency,,,,,,, Lasix 20 mg Monday through Friday off Saturday and Sunday  #6 degenerative joint disease right hip.......Christopher Marsh Kitchen refer to mat and Christopher Marsh at [facility]  #7 seborrheic keratosis left lower back.........Christopher Marsh Kitchen return for removal if it doesn't heal

## 2016-08-28 NOTE — Progress Notes (Signed)
Pre visit review using our clinic review tool, if applicable. No additional management support is needed unless otherwise documented below in the visit note. 

## 2016-08-28 NOTE — Patient Instructions (Addendum)
Your blood sugar is 140 year A1c is 6.8%......... this is diabetes............ Sugar free diet..... Exercise 30 minutes daily,,,,, check a fasting blood sugar daily in the morning,,,,, return in 2 weeks for follow-up  Your blood pressure is 190/90,,,,,,,, hypertension,,,,,,,, start benazepril 20 mg daily in the morning,,,,,,,,,,, check your blood pressure daily in the morning,,,,,,,, [employer],,,, Omron pump up digital blood pressure cuff,,,,,,,,, bring a copy of all your blood pressure readings and the device to next visit in 2 weeks  Mediterranean diet  We increased the Pravachol to 40 mg daily......... along with an aspirin tablet.......Marland Kitchen LDL goal is less than 75.......Marland Kitchen 100 now  Call Dr. Bing Plume ophthalmologist for an eye exam  We will refer to GI for screening colonoscopy

## 2016-09-18 ENCOUNTER — Other Ambulatory Visit: Payer: Self-pay | Admitting: Family Medicine

## 2016-09-18 ENCOUNTER — Ambulatory Visit (INDEPENDENT_AMBULATORY_CARE_PROVIDER_SITE_OTHER): Payer: BLUE CROSS/BLUE SHIELD | Admitting: Family Medicine

## 2016-09-18 ENCOUNTER — Encounter: Payer: Self-pay | Admitting: Family Medicine

## 2016-09-18 VITALS — BP 150/72 | HR 83 | Temp 97.9°F | Ht 74.5 in | Wt 312.6 lb

## 2016-09-18 DIAGNOSIS — I10 Essential (primary) hypertension: Secondary | ICD-10-CM | POA: Diagnosis not present

## 2016-09-18 DIAGNOSIS — E1165 Type 2 diabetes mellitus with hyperglycemia: Secondary | ICD-10-CM

## 2016-09-18 DIAGNOSIS — IMO0001 Reserved for inherently not codable concepts without codable children: Secondary | ICD-10-CM

## 2016-09-18 MED ORDER — BENAZEPRIL HCL 40 MG PO TABS: 40.0000 mg | ORAL_TABLET | Freq: Every day | ORAL | 3 refills | Status: DC

## 2016-09-18 MED ORDER — PRAVASTATIN SODIUM 20 MG PO TABS: 20.0000 mg | ORAL_TABLET | Freq: Every day | ORAL | 4 refills | Status: DC

## 2016-09-18 NOTE — Progress Notes (Signed)
Marlou Sa is a 55 year old married male nonsmoker who comes in today for follow-up of hypertension diabetes  We started him on metformin 500 mg daily along with counseling about diet exercise and weight loss. He's cut out the sugar. He's lost a pound and a half in 3 weeks. Blood sugars averaging 126.  His blood pressure at home is running 1 [id number] systolic diastolic [id number]. Counseled about getting blood pressure to goal 130/80 or less.  Vital signs stable he is afebrile  Diabetes at goal......... continue current therapy follow-up A1c in April........ check fasting blood sugars Monday Wednesday Friday to be sure it stays normal  Hypertension not at goal........ increase benazepril to 40 mg daily........ BP check daily...Marland KitchenMarland KitchenMarland Kitchen follow-up in 3 weeks

## 2016-09-18 NOTE — Patient Instructions (Signed)
Increase the Lotensin to 40 mg daily in the morning  Continue your exercise and salt restriction  Walk 30 minutes daily  Check your blood pressure daily in the morning  Return in 3 weeks with the data and the device  Since your blood sugars normal continue your current program of diet exercise weight loss and metformin one tablet daily in the morning with breakfast

## 2016-09-27 ENCOUNTER — Encounter: Payer: Self-pay | Admitting: Family Medicine

## 2016-10-09 ENCOUNTER — Encounter: Payer: Self-pay | Admitting: Family Medicine

## 2016-10-09 ENCOUNTER — Ambulatory Visit (INDEPENDENT_AMBULATORY_CARE_PROVIDER_SITE_OTHER): Payer: BLUE CROSS/BLUE SHIELD | Admitting: Family Medicine

## 2016-10-09 VITALS — BP 150/70 | HR 85 | Temp 98.4°F | Wt 314.5 lb

## 2016-10-09 DIAGNOSIS — I10 Essential (primary) hypertension: Secondary | ICD-10-CM

## 2016-10-09 LAB — BASIC METABOLIC PANEL
BUN: 6 mg/dL (ref 6–23)
CHLORIDE: 102 meq/L (ref 96–112)
CO2: 28 meq/L (ref 19–32)
CREATININE: 0.82 mg/dL (ref 0.40–1.50)
Calcium: 8.7 mg/dL (ref 8.4–10.5)
GFR: 102.8 mL/min (ref >60.00)
Glucose, Bld: 153 mg/dL — ABNORMAL HIGH (ref 70–99)
POTASSIUM: 4.3 meq/L (ref 3.5–5.1)
Sodium: 140 mEq/L (ref 135–145)

## 2016-10-09 MED ORDER — BENAZEPRIL HCL 40 MG PO TABS: ORAL_TABLET | ORAL | 3 refills | Status: DC

## 2016-10-09 NOTE — Progress Notes (Signed)
[name] is a 55 year old married male nonsmoker who comes in today for reevaluation of hypertension  He's on Lotensin 40 mg a day along with Lasix 20 mg a day. BP today 150/70 right arm sitting position  Review of systems otherwise negative  Physical exam.vs BP (!) 150/70 (BP Location: Left Arm, Patient Position: Sitting, Cuff Size: Normal)   Pulse 85   Temp 98.4 F (36.9 C) (Oral)   Wt (!) 314 lb 8 oz (142.7 kg)   BMI 39.84 kg/m    Right arm sitting position blood pressure 150/70 pulse 80 and regular  Impression hypertension not at goal......... increase Lopressor 60 mg daily check labs

## 2016-10-09 NOTE — Progress Notes (Signed)
Pre visit review using our clinic review tool, if applicable. No additional management support is needed unless otherwise documented below in the visit note. 

## 2016-10-09 NOTE — Patient Instructions (Signed)
Lopressor 40 mg........Marland Kitchen 1-1/2 tablets daily in the morning  Lasix 20 mg.....Marland Kitchen one tablet daily in the morning  Omron pump up digital blood pressure cuff....... check your blood pressure daily in the morning 2 hours after you take your medication  Return in 6 weeks for follow-up.......Marland Kitchen bring all the data and the new blood pressure cuff with you  Avoid salt  Walk 30 minutes daily  Avoid sugar

## 2016-11-14 ENCOUNTER — Ambulatory Visit: Payer: BLUE CROSS/BLUE SHIELD | Admitting: Family Medicine

## 2016-11-15 ENCOUNTER — Other Ambulatory Visit: Payer: Self-pay | Admitting: Family Medicine

## 2016-11-19 ENCOUNTER — Other Ambulatory Visit: Payer: Self-pay | Admitting: Emergency Medicine

## 2016-11-19 ENCOUNTER — Encounter: Payer: Self-pay | Admitting: Family Medicine

## 2016-11-19 ENCOUNTER — Ambulatory Visit (INDEPENDENT_AMBULATORY_CARE_PROVIDER_SITE_OTHER): Payer: BLUE CROSS/BLUE SHIELD | Admitting: Family Medicine

## 2016-11-19 ENCOUNTER — Other Ambulatory Visit: Payer: Self-pay | Admitting: Family Medicine

## 2016-11-19 VITALS — BP 150/82 | HR 63 | Temp 97.3°F | Ht 74.5 in | Wt 312.3 lb

## 2016-11-19 DIAGNOSIS — E1165 Type 2 diabetes mellitus with hyperglycemia: Secondary | ICD-10-CM

## 2016-11-19 DIAGNOSIS — IMO0001 Reserved for inherently not codable concepts without codable children: Secondary | ICD-10-CM

## 2016-11-19 DIAGNOSIS — I10 Essential (primary) hypertension: Secondary | ICD-10-CM | POA: Diagnosis not present

## 2016-11-19 MED ORDER — AMLODIPINE BESYLATE 2.5 MG PO TABS
2.5000 mg | ORAL_TABLET | Freq: Every day | ORAL | 4 refills | Status: DC
Start: 2016-11-19 — End: 2016-12-31

## 2016-11-19 NOTE — Progress Notes (Signed)
Pre visit review using our clinic review tool, if applicable. No additional management support is needed unless otherwise documented below in the visit note. 

## 2016-11-19 NOTE — Progress Notes (Signed)
Christopher Marsh is a 56 year old married..... Recently for the second time..... Who comes in today for follow-up of hypertension diabetes and obesity  He's been on a diet and has lost 3 pounds. He cannot exercise because you're degenerative joint disease. He needs a left hip replacement.  He takes Lotensin and we increased the dose to 60 mg daily however his blood pressure still elevated. Last week at home and was run in the 170/85-90. Today 150/82.  His blood sugar has decreased since she's changed his diet. Is on metformin 500 mg daily. Blood sugars and on the 110 range.  BP (!) 150/82 (BP Location: Right Arm, Patient Position: Sitting, Cuff Size: Large)   Pulse 63   Temp 97.3 F (36.3 C) (Oral)   Ht 6' 2.5" (1.892 m)   Wt (!) 312 lb 4.8 oz (141.7 kg)   BMI 39.56 kg/m  Blood pressure right arm sitting position 150/82 pulse 70 and regular  #1 hypertension not at goal........ add Norvasc 2.5 mg daily........ BP check daily follow-up in 4-6 weeks  #2 diabetes type 2...Marland KitchenMarland KitchenMarland Kitchen continue current regime  #3 obesity........ continue weight loss  #4 degenerative joint disease right hip............ continue tramadol.

## 2016-11-19 NOTE — Patient Instructions (Signed)
Continue diet and weight loss,,,,,,,,,,,, good job  Add Norvasc 2.5 mg,,,,,,, one tablet daily in the morning  Continue the Lotensin 40 mg....... one half tablets every morning  Check your blood pressure daily in the morning........Marland Kitchen return in 4-6 weeks for follow-up...Marland KitchenMarland KitchenMarland Kitchen when you return bring a record of all your blood pressure readings and the device

## 2016-12-07 DIAGNOSIS — M1612 Unilateral primary osteoarthritis, left hip: Secondary | ICD-10-CM | POA: Diagnosis not present

## 2016-12-31 ENCOUNTER — Ambulatory Visit (INDEPENDENT_AMBULATORY_CARE_PROVIDER_SITE_OTHER): Payer: BLUE CROSS/BLUE SHIELD | Admitting: Family Medicine

## 2016-12-31 ENCOUNTER — Encounter: Payer: Self-pay | Admitting: Family Medicine

## 2016-12-31 VITALS — BP 152/70 | Temp 98.2°F | Wt 311.0 lb

## 2016-12-31 DIAGNOSIS — I10 Essential (primary) hypertension: Secondary | ICD-10-CM

## 2016-12-31 MED ORDER — AMLODIPINE BESYLATE 5 MG PO TABS
5.0000 mg | ORAL_TABLET | Freq: Every day | ORAL | 3 refills | Status: DC
Start: 2016-12-31 — End: 2017-11-18

## 2016-12-31 NOTE — Patient Instructions (Addendum)
Increase her Norvasc to 5 mg daily in the morning  Check your blood pressure daily in the morning  Follow-up in 4-6 weeks  I would decrease the aspirin to a baby aspirin and his future you develop a nosebleed apply pressure and ice as we discussed and hold the aspirin for a week. Also if you have a nosebleed check your blood pressure immediately to be sure your nosebleed is not secondary to elevation of your blood pressure.

## 2016-12-31 NOTE — Progress Notes (Signed)
Pre visit review using our clinic review tool, if applicable. No additional management support is needed unless otherwise documented below in the visit note. 

## 2016-12-31 NOTE — Progress Notes (Signed)
Christopher Marsh is a 56 year old married male nonsmoker who comes in today for follow-up of hypertension  We been working with him over the last couple months to lower his blood pressures are normal. He's on Lotensin 40 mg dose tablet a half daily therefore total dose to 60 mg, Lasix 20 mg Monday through Friday, we added Norvasc 2.5 mg last time because his blood pressure was in the 170-180 range. Systolic now is dropped to 152. Diastolic remains in the 70 range.  Weight is unchanged  He continues to see his orthopedist because of degenerative left hip. Recently had a steroid injection  He's had a couple spells refill lightheaded but no syncope. On one episode whenever stress at work he checked his blood pressure was 0000000 systolic.  He takes a regular aspirin tablet. 3 weeks later severe nosebleed that wouldn't stop finally did at home with pressure and ice. He didn't check his blood pressure at that time.  BP (!) 152/70 (BP Location: Right Arm, Patient Position: Sitting, Cuff Size: Large)   Temp 98.2 F (36.8 C) (Oral)   Wt (!) 311 lb (141.1 kg)   BMI 39.40 kg/m  Denice Paradise is well-developed well-nourished male no acute distress BP right arm sitting position 152/70  Hypertension....... improved but not at goal..... Increase Norvasc to 5 mg daily follow-up in 4-6 weeks

## 2017-02-11 ENCOUNTER — Encounter: Payer: Self-pay | Admitting: Family Medicine

## 2017-02-11 ENCOUNTER — Ambulatory Visit (INDEPENDENT_AMBULATORY_CARE_PROVIDER_SITE_OTHER): Payer: BLUE CROSS/BLUE SHIELD | Admitting: Family Medicine

## 2017-02-11 VITALS — BP 140/70 | Temp 98.3°F | Wt 308.0 lb

## 2017-02-11 DIAGNOSIS — I10 Essential (primary) hypertension: Secondary | ICD-10-CM | POA: Diagnosis not present

## 2017-02-11 NOTE — Progress Notes (Signed)
Christopher Marsh is a 56 year old recently remarried male......... last September........ nonsmoker since 1982 who comes in today for follow-up of hypertension  We increased his Norvasc to 5 mg daily about 6 weeks ago. He takes 60 mg Lotensin along with Lasix 20 mg Monday through Friday. BP at home is running 130/80 range. He was sick last week and a spiked up to 586 systolic diastolics all been normal.  He had injection in that hip because severe arthritis. Is not able to walk. He's decided the spring he's going to start a diet and exercise program. He drinks a couple beers per day and is going to stop that completely.  He says in my chart there is a comment about hepatitis C. Many years ago he saw Dr. Sharlett Iles around 2000. His brother had hepatitis C. Kol's test showed a "very very trace" amount of hepatitis C he was therefore treated for 6 months with interferon and completely resolved.  BP 140/70 (BP Location: Right Arm, Patient Position: Sitting, Cuff Size: Large)   Temp 98.3 F (36.8 C) (Oral)   Wt (!) 308 lb (139.7 kg)   BMI 39.02 kg/m BP right arm sitting position 140/70  Impression  #1 hypertension approaching goal..........Marland Kitchen with diet exercise and no alcohol take his blood pressure will drop to normal. Follow-up in September  #2 obesity weight down to 308 pounds............ continue diet and exercise and weight loss program

## 2017-02-11 NOTE — Patient Instructions (Signed)
Continue current medications  Work heart on your diet exercise and weight loss  Follow-up in September  Check your blood pressure at home weekly.,,,,,,,,,,, BP goal 135/85 or less.

## 2017-02-11 NOTE — Progress Notes (Signed)
Pre visit review using our clinic review tool, if applicable. No additional management support is needed unless otherwise documented below in the visit note. 

## 2017-03-05 ENCOUNTER — Encounter: Payer: Self-pay | Admitting: Family Medicine

## 2017-05-21 DIAGNOSIS — M87052 Idiopathic aseptic necrosis of left femur: Secondary | ICD-10-CM | POA: Diagnosis not present

## 2017-05-21 DIAGNOSIS — M25552 Pain in left hip: Secondary | ICD-10-CM | POA: Diagnosis not present

## 2017-05-24 ENCOUNTER — Other Ambulatory Visit: Payer: Self-pay | Admitting: Physician Assistant

## 2017-05-24 DIAGNOSIS — M8700 Idiopathic aseptic necrosis of unspecified bone: Secondary | ICD-10-CM

## 2017-06-06 DIAGNOSIS — M1612 Unilateral primary osteoarthritis, left hip: Secondary | ICD-10-CM | POA: Diagnosis not present

## 2017-06-07 ENCOUNTER — Ambulatory Visit
Admission: RE | Admit: 2017-06-07 | Discharge: 2017-06-07 | Disposition: A | Discharge status: Home / Self Care | Payer: BLUE CROSS/BLUE SHIELD | Source: Ambulatory Visit | Attending: Physician Assistant | Admitting: Physician Assistant

## 2017-06-07 DIAGNOSIS — M25552 Pain in left hip: Secondary | ICD-10-CM | POA: Diagnosis not present

## 2017-06-07 DIAGNOSIS — M8700 Idiopathic aseptic necrosis of unspecified bone: Secondary | ICD-10-CM

## 2017-06-07 MED ORDER — IOPAMIDOL (ISOVUE-M 200) INJECTION 41%
1.0000 mL | Freq: Once | INTRAMUSCULAR | Status: AC
  Administered 2017-06-07: 1 mL via INTRA_ARTICULAR

## 2017-06-07 MED ORDER — METHYLPREDNISOLONE ACETATE 40 MG/ML INJ SUSP (RADIOLOG
120.0000 mg | Freq: Once | INTRAMUSCULAR | Status: AC
  Administered 2017-06-07: 120 mg via INTRA_ARTICULAR

## 2017-07-10 ENCOUNTER — Encounter
Admission: RE | Admit: 2017-07-10 | Discharge: 2017-07-10 | Disposition: A | Discharge status: Home / Self Care | Payer: BLUE CROSS/BLUE SHIELD | Source: Ambulatory Visit | Attending: Orthopedic Surgery | Admitting: Orthopedic Surgery

## 2017-07-10 DIAGNOSIS — Z01818 Encounter for other preprocedural examination: Secondary | ICD-10-CM | POA: Diagnosis not present

## 2017-07-10 DIAGNOSIS — I10 Essential (primary) hypertension: Secondary | ICD-10-CM | POA: Diagnosis not present

## 2017-07-10 HISTORY — DX: Hyperlipidemia, unspecified: E78.5

## 2017-07-10 HISTORY — DX: Dyspnea, unspecified: R06.00

## 2017-07-10 HISTORY — DX: Unspecified osteoarthritis, unspecified site: M19.90

## 2017-07-10 HISTORY — DX: Localized edema: R60.0

## 2017-07-10 HISTORY — DX: Type 2 diabetes mellitus without complications: E11.9

## 2017-07-10 HISTORY — DX: Essential (primary) hypertension: I10

## 2017-07-10 HISTORY — DX: Acute embolism and thrombosis of unspecified deep veins of unspecified lower extremity: I82.409

## 2017-07-10 LAB — URINALYSIS, ROUTINE W REFLEX MICROSCOPIC
BILIRUBIN URINE: NEGATIVE
Glucose, UA: 50 mg/dL — AB
HGB URINE DIPSTICK: NEGATIVE
Ketones, ur: NEGATIVE mg/dL
Leukocytes, UA: NEGATIVE
Nitrite: NEGATIVE
PH: 5 (ref 5.0–8.0)
Protein, ur: NEGATIVE mg/dL
SPECIFIC GRAVITY, URINE: 1.016 (ref 1.005–1.030)

## 2017-07-10 LAB — COMPREHENSIVE METABOLIC PANEL
ALBUMIN: 4.3 g/dL (ref 3.5–5.0)
ALT: 32 U/L (ref 17–63)
ANION GAP: 7 (ref 5–15)
AST: 27 U/L (ref 15–41)
Alkaline Phosphatase: 50 U/L (ref 38–126)
BILIRUBIN TOTAL: 0.7 mg/dL (ref 0.3–1.2)
BUN: 9 mg/dL (ref 6–20)
CHLORIDE: 102 mmol/L (ref 101–111)
CO2: 29 mmol/L (ref 22–32)
Calcium: 8.9 mg/dL (ref 8.9–10.3)
Creatinine, Ser: 0.85 mg/dL (ref 0.61–1.24)
GFR calc Af Amer: >60 mL/min (ref >60)
GFR calc non Af Amer: >60 mL/min (ref >60)
GLUCOSE: 110 mg/dL — AB (ref 65–99)
POTASSIUM: 4.3 mmol/L (ref 3.5–5.1)
Sodium: 138 mmol/L (ref 135–145)
TOTAL PROTEIN: 7.1 g/dL (ref 6.5–8.1)

## 2017-07-10 LAB — CBC WITH DIFFERENTIAL/PLATELET
BASOS ABS: 0.1 10*3/uL (ref 0–0.1)
BASOS PCT: 1 %
EOS ABS: 0.1 10*3/uL (ref 0–0.7)
EOS PCT: 1 %
HCT: 43.8 % (ref 40.0–52.0)
Hemoglobin: 15.5 g/dL (ref 13.0–18.0)
Lymphocytes Relative: 15 %
Lymphs Abs: 0.9 10*3/uL — ABNORMAL LOW (ref 1.0–3.6)
MCH: 32.6 pg (ref 26.0–34.0)
MCHC: 35.4 g/dL (ref 32.0–36.0)
MCV: 92.3 fL (ref 80.0–100.0)
MONO ABS: 0.6 10*3/uL (ref 0.2–1.0)
MONOS PCT: 11 %
Neutro Abs: 4.1 10*3/uL (ref 1.4–6.5)
Neutrophils Relative %: 72 %
PLATELETS: 222 10*3/uL (ref 150–440)
RBC: 4.75 MIL/uL (ref 4.40–5.90)
RDW: 14 % (ref 11.5–14.5)
WBC: 5.7 10*3/uL (ref 3.8–10.6)

## 2017-07-10 LAB — PROTIME-INR
INR: 0.98
PROTHROMBIN TIME: 12.9 s (ref 11.4–15.2)

## 2017-07-10 LAB — SURGICAL PCR SCREEN
MRSA, PCR: NEGATIVE
Staphylococcus aureus: POSITIVE — AB

## 2017-07-10 LAB — APTT: APTT: 31 s (ref 24–36)

## 2017-07-10 LAB — TYPE AND SCREEN
ABO/RH(D): B POS
ANTIBODY SCREEN: NEGATIVE

## 2017-07-10 LAB — C-REACTIVE PROTEIN: CRP: <0.8 mg/dL (ref <1.0)

## 2017-07-10 LAB — SEDIMENTATION RATE: Sed Rate: 4 mm/hr (ref 0–20)

## 2017-07-10 NOTE — Patient Instructions (Addendum)
Your procedure is scheduled on: 07/22/17 Mon Report to Same Day Surgery 2nd floor medical mall [facility] Entrance-take elevator on left to 2nd floor.  Check in with surgery information desk.) To find out your arrival time please call [phone] between 1PM - 3PM on 07/19/17 Fri  Remember: Instructions that are not followed completely may result in serious medical risk, up to and including death, or upon the discretion of your surgeon and anesthesiologist your surgery may need to be rescheduled.    _x___ 1. Do not eat food after midnight the night before your procedure. You may drink clear liquids up to 2 hours before you are scheduled to arrive at the hospital for your procedure.  Do not drink clear liquids within 2 hours of your scheduled arrival to the hospital.  Clear liquids include  --Water or Apple juice without pulp  --Clear carbohydrate beverage such as ClearFast or Gatorade  --Black Coffee or Clear Tea (No milk, no creamers, do not add anything to                  the coffee or Tea Type 1 and type 2 diabetics should only drink water.  No gum chewing or hard candies.     __x__ 2. No Alcohol for 24 hours before or after surgery.   __x__3. No Smoking for 24 prior to surgery.   ____  4. Bring all medications with you on the day of surgery if instructed.    __x__ 5. Notify your doctor if there is any change in your medical condition     (cold, fever, infections).     Do not wear jewelry, make-up, hairpins, clips or nail polish.  Do not wear lotions, powders, or perfumes. You may wear deodorant.  Do not shave 48 hours prior to surgery. Men may shave face and neck.  Do not bring valuables to the hospital.    [facility] is not responsible for any belongings or valuables.               Contacts, dentures or bridgework may not be worn into surgery.  Leave your suitcase in the car. After surgery it may be brought to your room.  For patients admitted to the hospital, discharge  time is determined by your                       treatment team.   Patients discharged the day of surgery will not be allowed to drive home.  You will need someone to drive you home and stay with you the night of your procedure.    Please read over the following fact sheets that you were given:   [facility] Preparing for Surgery and or MRSA Information   _x___ Take anti-hypertensive listed below, cardiac, seizure, asthma,     anti-reflux and psychiatric medicines. These include:  1. amLODipine (NORVASC) 5 MG tablet  2.pravastatin (PRAVACHOL) 20 MG tablet  3.traMADol (ULTRAM) 50 MG tablet  4.  5.  6.  ____Fleets enema or Magnesium Citrate as directed.   _x___ Use CHG Soap or sage wipes as directed on instruction sheet   ____ Use inhalers on the day of surgery and bring to hospital day of surgery  _x___ Stop Metformin and Janumet 2 days prior to surgery.    ____ Take 1/2 of usual insulin dose the night before surgery and none on the morning     surgery.   _x___ Follow recommendations from Cardiologist,  Pulmonologist or PCP regarding          stopping Aspirin, Coumadin, Plavix ,Eliquis, Effient, or Pradaxa, and Pletal.  Stop Aspirin 1 week before surgery.  X____Stop Anti-inflammatories such as Advil, Aleve, Ibuprofen, Motrin, Naproxen, Naprosyn, Goodies powders or aspirin products. OK to take Tylenol and                          Celebrex.   _x___ Stop supplements until after surgery.  But may continue Vitamin D, Vitamin B,       and multivitamin.   ____ Bring C-Pap to the hospital.

## 2017-07-11 LAB — HEMOGLOBIN A1C
HEMOGLOBIN A1C: 6.5 % — AB (ref 4.8–5.6)
Mean Plasma Glucose: 139.85 mg/dL

## 2017-07-15 ENCOUNTER — Encounter: Payer: Self-pay | Admitting: Family Medicine

## 2017-07-15 ENCOUNTER — Ambulatory Visit (INDEPENDENT_AMBULATORY_CARE_PROVIDER_SITE_OTHER): Payer: BLUE CROSS/BLUE SHIELD | Admitting: Family Medicine

## 2017-07-15 VITALS — BP 110/58 | HR 78 | Temp 98.3°F | Wt 301.0 lb

## 2017-07-15 DIAGNOSIS — I10 Essential (primary) hypertension: Secondary | ICD-10-CM | POA: Diagnosis not present

## 2017-07-15 DIAGNOSIS — M25552 Pain in left hip: Secondary | ICD-10-CM

## 2017-07-15 NOTE — Progress Notes (Signed)
Christopher Marsh is a 56 year old married male nonsmoker who comes in today for evaluation of hypertension and to discuss his upcoming left hip surgery  He's been seen in [place] by his orthopedist Dr. Marry Guan. Surgery is planned for September 24.  He's currently on Norvasc 5 mg, Lotensin 60 mg, Lasix 20 mg,. However he's noticed last couple weeks his blood pressures been running low. He's also lightheaded when he stands up or works in a sheet for long periods of time. We discussed the fact that bedrest postop will naturally causes blood pressure to drop and we will adjust his medicine accordingly so he does not develop hypotension.  We also discussed postop constipation because of immobilization in medication. I recommend he take MiraLAX on Saturday before the procedure for good bowel cleanout and stay on liquids on Sunday prior to surgery on Monday.  BP (!) 110/58 (BP Location: Right Arm, Patient Position: Sitting, Cuff Size: Large)   Pulse 78   Temp 98.3 F (36.8 C) (Oral)   Wt (!) 301 lb (136.5 kg)   SpO2 97%   BMI 37.62 kg/m  In general he is well-developed well-nourished male no acute distress BP today right arm sitting position 110/58 pulse 78 and regular  Hypertension...Marland KitchenMarland KitchenMarland Kitchen BP too low........ hold the Lotensin and diuretic. Continue the Norvasc. BP check daily at home. If blood pressure rises to 140/90 or greater than restart the Lotensin at 40 mg daily. He also can call me if he has any questions or problems.  #2 left hip pain degenerative joint disease..... Surgery 924 total hip replacement

## 2017-07-15 NOTE — Patient Instructions (Signed)
Because u  blood pressures is too low today.......Christopher Marsh 110/58........ let's hold your Lotensin and diuretic. Continue Norvasc 5 mg one tablet daily in the morning.  Check your blood pressure daily. If your blood pressure rises to 140/90 or greater than restart the Lotensin at 40 mg daily.  Monitor your blood pressure throughout your postoperative time. Return to see me about 10-14 days postop for follow-up on your blood pressure.  My cell phone numbers [phone]..........  feel free to call me directly.  I would also recommend that the Saturday prior to your surgery take MiraLAX as a good bowel prep. Then on Sunday prior to surgery stay on clear liquids  Constipation is always a big problem postoperatively and I have found in the past the above works well.

## 2017-07-19 ENCOUNTER — Encounter: Payer: Self-pay | Admitting: Family Medicine

## 2017-07-21 MED ORDER — TRANEXAMIC ACID 1000 MG/10ML IV SOLN
1000.0000 mg | INTRAVENOUS | Status: AC
  Administered 2017-07-22: 1000 mg via INTRAVENOUS
  Filled 2017-07-21: qty 10

## 2017-07-21 MED ORDER — DEXTROSE 5 % IV SOLN
3.0000 g | INTRAVENOUS | Status: AC
  Administered 2017-07-22: 3 g via INTRAVENOUS
  Filled 2017-07-21: qty 3000

## 2017-07-22 ENCOUNTER — Inpatient Hospital Stay: Payer: BLUE CROSS/BLUE SHIELD

## 2017-07-22 ENCOUNTER — Inpatient Hospital Stay: Payer: BLUE CROSS/BLUE SHIELD | Admitting: Anesthesiology

## 2017-07-22 ENCOUNTER — Encounter
Admission: RE | Disposition: A | Discharge status: Home Health | Payer: Self-pay | Source: Ambulatory Visit | Attending: Orthopedic Surgery

## 2017-07-22 ENCOUNTER — Encounter: Payer: Self-pay | Admitting: Orthopedic Surgery

## 2017-07-22 ENCOUNTER — Inpatient Hospital Stay
Admission: RE | Admit: 2017-07-22 | Discharge: 2017-07-24 | DRG: 470 | Disposition: A | Discharge status: Home Health | Payer: BLUE CROSS/BLUE SHIELD | Source: Ambulatory Visit | Attending: Orthopedic Surgery | Admitting: Orthopedic Surgery

## 2017-07-22 DIAGNOSIS — Z471 Aftercare following joint replacement surgery: Secondary | ICD-10-CM | POA: Diagnosis not present

## 2017-07-22 DIAGNOSIS — Z7984 Long term (current) use of oral hypoglycemic drugs: Secondary | ICD-10-CM | POA: Diagnosis not present

## 2017-07-22 DIAGNOSIS — J45909 Unspecified asthma, uncomplicated: Secondary | ICD-10-CM | POA: Diagnosis not present

## 2017-07-22 DIAGNOSIS — I10 Essential (primary) hypertension: Secondary | ICD-10-CM | POA: Diagnosis present

## 2017-07-22 DIAGNOSIS — Z79899 Other long term (current) drug therapy: Secondary | ICD-10-CM

## 2017-07-22 DIAGNOSIS — Z23 Encounter for immunization: Secondary | ICD-10-CM | POA: Diagnosis not present

## 2017-07-22 DIAGNOSIS — E785 Hyperlipidemia, unspecified: Secondary | ICD-10-CM | POA: Diagnosis present

## 2017-07-22 DIAGNOSIS — Z96649 Presence of unspecified artificial hip joint: Secondary | ICD-10-CM

## 2017-07-22 DIAGNOSIS — M1612 Unilateral primary osteoarthritis, left hip: Secondary | ICD-10-CM | POA: Diagnosis not present

## 2017-07-22 DIAGNOSIS — E119 Type 2 diabetes mellitus without complications: Secondary | ICD-10-CM | POA: Diagnosis not present

## 2017-07-22 DIAGNOSIS — Z96642 Presence of left artificial hip joint: Secondary | ICD-10-CM | POA: Diagnosis not present

## 2017-07-22 DIAGNOSIS — M25552 Pain in left hip: Secondary | ICD-10-CM | POA: Diagnosis not present

## 2017-07-22 DIAGNOSIS — M1712 Unilateral primary osteoarthritis, left knee: Secondary | ICD-10-CM | POA: Diagnosis present

## 2017-07-22 HISTORY — PX: TOTAL HIP ARTHROPLASTY: SHX124

## 2017-07-22 HISTORY — PX: JOINT REPLACEMENT: SHX530

## 2017-07-22 LAB — GLUCOSE, CAPILLARY
GLUCOSE-CAPILLARY: 129 mg/dL — AB (ref 65–99)
GLUCOSE-CAPILLARY: 194 mg/dL — AB (ref 65–99)
GLUCOSE-CAPILLARY: 199 mg/dL — AB (ref 65–99)
Glucose-Capillary: 154 mg/dL — ABNORMAL HIGH (ref 65–99)

## 2017-07-22 LAB — ABO/RH: ABO/RH(D): B POS

## 2017-07-22 SURGERY — ARTHROPLASTY, HIP, TOTAL,POSTERIOR APPROACH
Anesthesia: Spinal | Site: Hip | Laterality: Left | Wound class: Clean

## 2017-07-22 MED ORDER — SODIUM CHLORIDE 0.9 % IV SOLN
INTRAVENOUS | Status: DC
  Administered 2017-07-22 (×2): via INTRAVENOUS

## 2017-07-22 MED ORDER — AMLODIPINE BESYLATE 5 MG PO TABS
5.0000 mg | ORAL_TABLET | Freq: Every day | ORAL | Status: DC
  Administered 2017-07-23 – 2017-07-24 (×2): 5 mg via ORAL
  Filled 2017-07-22 (×2): qty 1

## 2017-07-22 MED ORDER — FENTANYL CITRATE (PF) 100 MCG/2ML IJ SOLN
INTRAMUSCULAR | Status: AC
  Filled 2017-07-22: qty 2

## 2017-07-22 MED ORDER — FERROUS SULFATE 325 (65 FE) MG PO TABS
325.0000 mg | ORAL_TABLET | Freq: Two times a day (BID) | ORAL | Status: DC
  Administered 2017-07-22 – 2017-07-24 (×4): 325 mg via ORAL
  Filled 2017-07-22 (×4): qty 1

## 2017-07-22 MED ORDER — ACETAMINOPHEN 10 MG/ML IV SOLN
INTRAVENOUS | Status: DC | PRN
  Administered 2017-07-22: 1000 mg via INTRAVENOUS

## 2017-07-22 MED ORDER — PHENYLEPHRINE HCL 10 MG/ML IJ SOLN
INTRAMUSCULAR | Status: AC
  Filled 2017-07-22: qty 1

## 2017-07-22 MED ORDER — PHENOL 1.4 % MT LIQD
1.0000 | OROMUCOSAL | Status: DC | PRN
  Filled 2017-07-22: qty 177

## 2017-07-22 MED ORDER — OXYCODONE HCL 5 MG/5ML PO SOLN: 5.0000 mg | Freq: Once | ORAL | Status: DC | PRN

## 2017-07-22 MED ORDER — ACETAMINOPHEN 10 MG/ML IV SOLN
1000.0000 mg | Freq: Four times a day (QID) | INTRAVENOUS | Status: AC
  Administered 2017-07-22 (×3): 1000 mg via INTRAVENOUS
  Filled 2017-07-22 (×4): qty 100

## 2017-07-22 MED ORDER — FAMOTIDINE 20 MG PO TABS
ORAL_TABLET | ORAL | Status: AC
  Filled 2017-07-22: qty 1

## 2017-07-22 MED ORDER — NEOMYCIN-POLYMYXIN B GU 40-200000 IR SOLN
Status: AC
  Filled 2017-07-22: qty 20

## 2017-07-22 MED ORDER — PHENYLEPHRINE HCL 10 MG/ML IJ SOLN: INTRAMUSCULAR | Status: DC | PRN

## 2017-07-22 MED ORDER — SODIUM CHLORIDE 0.9 % IV SOLN
INTRAVENOUS | Status: DC | PRN
  Administered 2017-07-22: 20 ug/min via INTRAVENOUS

## 2017-07-22 MED ORDER — NEOMYCIN-POLYMYXIN B GU 40-200000 IR SOLN
Status: DC | PRN
  Administered 2017-07-22: 14 mL

## 2017-07-22 MED ORDER — PROPOFOL 500 MG/50ML IV EMUL
INTRAVENOUS | Status: AC
  Filled 2017-07-22: qty 50

## 2017-07-22 MED ORDER — ONDANSETRON HCL 4 MG/2ML IJ SOLN
INTRAMUSCULAR | Status: DC | PRN
  Administered 2017-07-22: 4 mg via INTRAVENOUS

## 2017-07-22 MED ORDER — LIDOCAINE HCL (PF) 2 % IJ SOLN
INTRAMUSCULAR | Status: AC
  Filled 2017-07-22: qty 2

## 2017-07-22 MED ORDER — DIPHENHYDRAMINE HCL 12.5 MG/5ML PO ELIX: 12.5000 mg | ORAL_SOLUTION | ORAL | Status: DC | PRN

## 2017-07-22 MED ORDER — FENTANYL CITRATE (PF) 100 MCG/2ML IJ SOLN
25.0000 ug | INTRAMUSCULAR | Status: AC | PRN
  Administered 2017-07-22 (×6): 25 ug via INTRAVENOUS

## 2017-07-22 MED ORDER — MIDAZOLAM HCL 5 MG/5ML IJ SOLN
INTRAMUSCULAR | Status: DC | PRN
  Administered 2017-07-22 (×2): 1 mg via INTRAVENOUS

## 2017-07-22 MED ORDER — OXYCODONE HCL 5 MG PO TABS: 5.0000 mg | ORAL_TABLET | Freq: Once | ORAL | Status: DC | PRN

## 2017-07-22 MED ORDER — FENTANYL CITRATE (PF) 100 MCG/2ML IJ SOLN
INTRAMUSCULAR | Status: DC | PRN
  Administered 2017-07-22: 25 ug via INTRAVENOUS
  Administered 2017-07-22: 50 ug via INTRAVENOUS
  Administered 2017-07-22: 25 ug via INTRAVENOUS

## 2017-07-22 MED ORDER — MORPHINE SULFATE (PF) 2 MG/ML IV SOLN: 2.0000 mg | INTRAVENOUS | Status: DC | PRN

## 2017-07-22 MED ORDER — ACETAMINOPHEN 650 MG RE SUPP: 650.0000 mg | Freq: Four times a day (QID) | RECTAL | Status: DC | PRN

## 2017-07-22 MED ORDER — METOCLOPRAMIDE HCL 10 MG PO TABS
10.0000 mg | ORAL_TABLET | Freq: Three times a day (TID) | ORAL | Status: AC
  Administered 2017-07-22 – 2017-07-24 (×8): 10 mg via ORAL
  Filled 2017-07-22 (×8): qty 1

## 2017-07-22 MED ORDER — CEFAZOLIN SODIUM-DEXTROSE 2-4 GM/100ML-% IV SOLN
2.0000 g | Freq: Four times a day (QID) | INTRAVENOUS | Status: AC
  Administered 2017-07-22 – 2017-07-23 (×4): 2 g via INTRAVENOUS
  Filled 2017-07-22 (×4): qty 100

## 2017-07-22 MED ORDER — ACETAMINOPHEN 10 MG/ML IV SOLN
INTRAVENOUS | Status: AC
  Filled 2017-07-22: qty 100

## 2017-07-22 MED ORDER — INSULIN ASPART 100 UNIT/ML ~~LOC~~ SOLN
0.0000 [IU] | Freq: Three times a day (TID) | SUBCUTANEOUS | Status: DC
  Administered 2017-07-22 – 2017-07-23 (×2): 3 [IU] via SUBCUTANEOUS
  Administered 2017-07-23: 2 [IU] via SUBCUTANEOUS
  Administered 2017-07-23 – 2017-07-24 (×2): 3 [IU] via SUBCUTANEOUS
  Filled 2017-07-22 (×5): qty 1

## 2017-07-22 MED ORDER — MEPERIDINE HCL 50 MG/ML IJ SOLN: 6.2500 mg | INTRAMUSCULAR | Status: DC | PRN

## 2017-07-22 MED ORDER — ONDANSETRON HCL 4 MG/2ML IJ SOLN: 4.0000 mg | Freq: Four times a day (QID) | INTRAMUSCULAR | Status: DC | PRN

## 2017-07-22 MED ORDER — TRANEXAMIC ACID 1000 MG/10ML IV SOLN
1000.0000 mg | Freq: Once | INTRAVENOUS | Status: AC
  Administered 2017-07-22: 1000 mg via INTRAVENOUS
  Filled 2017-07-22: qty 10

## 2017-07-22 MED ORDER — BISACODYL 10 MG RE SUPP: 10.0000 mg | Freq: Every day | RECTAL | Status: DC | PRN

## 2017-07-22 MED ORDER — BUPIVACAINE HCL (PF) 0.5 % IJ SOLN
INTRAMUSCULAR | Status: AC
  Filled 2017-07-22: qty 10

## 2017-07-22 MED ORDER — PRAVASTATIN SODIUM 20 MG PO TABS
20.0000 mg | ORAL_TABLET | Freq: Every day | ORAL | Status: DC
  Administered 2017-07-23 – 2017-07-24 (×2): 20 mg via ORAL
  Filled 2017-07-22 (×2): qty 1

## 2017-07-22 MED ORDER — MAGNESIUM HYDROXIDE 400 MG/5ML PO SUSP
30.0000 mL | Freq: Every day | ORAL | Status: DC | PRN
  Administered 2017-07-23: 30 mL via ORAL
  Filled 2017-07-22: qty 30

## 2017-07-22 MED ORDER — ACETAMINOPHEN 325 MG PO TABS: 650.0000 mg | ORAL_TABLET | Freq: Four times a day (QID) | ORAL | Status: DC | PRN

## 2017-07-22 MED ORDER — ONDANSETRON HCL 4 MG PO TABS: 4.0000 mg | ORAL_TABLET | Freq: Four times a day (QID) | ORAL | Status: DC | PRN

## 2017-07-22 MED ORDER — TETRACAINE HCL 1 % IJ SOLN
INTRAMUSCULAR | Status: DC | PRN
  Administered 2017-07-22: 10 mg via INTRASPINAL

## 2017-07-22 MED ORDER — OXYCODONE HCL 5 MG PO TABS
5.0000 mg | ORAL_TABLET | ORAL | Status: DC | PRN
  Administered 2017-07-22 – 2017-07-23 (×5): 10 mg via ORAL
  Administered 2017-07-24: 5 mg via ORAL
  Filled 2017-07-22 (×2): qty 2
  Filled 2017-07-22: qty 1
  Filled 2017-07-22 (×3): qty 2

## 2017-07-22 MED ORDER — MIDAZOLAM HCL 2 MG/2ML IJ SOLN
INTRAMUSCULAR | Status: AC
  Filled 2017-07-22: qty 2

## 2017-07-22 MED ORDER — SENNOSIDES-DOCUSATE SODIUM 8.6-50 MG PO TABS
1.0000 | ORAL_TABLET | Freq: Two times a day (BID) | ORAL | Status: DC
  Administered 2017-07-22 – 2017-07-24 (×4): 1 via ORAL
  Filled 2017-07-22 (×4): qty 1

## 2017-07-22 MED ORDER — CHLORHEXIDINE GLUCONATE 4 % EX LIQD: 60.0000 mL | Freq: Once | CUTANEOUS | Status: DC

## 2017-07-22 MED ORDER — EPHEDRINE SULFATE 50 MG/ML IJ SOLN
INTRAMUSCULAR | Status: AC
  Filled 2017-07-22: qty 1

## 2017-07-22 MED ORDER — PROMETHAZINE HCL 25 MG/ML IJ SOLN: 6.2500 mg | INTRAMUSCULAR | Status: DC | PRN

## 2017-07-22 MED ORDER — MENTHOL 3 MG MT LOZG
1.0000 | LOZENGE | OROMUCOSAL | Status: DC | PRN
  Filled 2017-07-22: qty 9

## 2017-07-22 MED ORDER — PHENYLEPHRINE HCL 10 MG/ML IJ SOLN
INTRAMUSCULAR | Status: DC | PRN
  Administered 2017-07-22 (×2): 100 ug via INTRAVENOUS
  Administered 2017-07-22: 200 ug via INTRAVENOUS

## 2017-07-22 MED ORDER — FAMOTIDINE 20 MG PO TABS
20.0000 mg | ORAL_TABLET | Freq: Once | ORAL | Status: AC
  Administered 2017-07-22: 20 mg via ORAL

## 2017-07-22 MED ORDER — TRAMADOL HCL 50 MG PO TABS
50.0000 mg | ORAL_TABLET | ORAL | Status: DC | PRN
  Administered 2017-07-22 – 2017-07-23 (×4): 100 mg via ORAL
  Filled 2017-07-22 (×4): qty 2

## 2017-07-22 MED ORDER — METFORMIN HCL 500 MG PO TABS
500.0000 mg | ORAL_TABLET | Freq: Every day | ORAL | Status: DC
  Administered 2017-07-23 – 2017-07-24 (×2): 500 mg via ORAL
  Filled 2017-07-22 (×2): qty 1

## 2017-07-22 MED ORDER — TRIAMCINOLONE ACETONIDE 0.1 % EX CREA
1.0000 "application " | TOPICAL_CREAM | Freq: Two times a day (BID) | CUTANEOUS | Status: DC | PRN
  Filled 2017-07-22: qty 15

## 2017-07-22 MED ORDER — LIDOCAINE HCL (CARDIAC) 20 MG/ML IV SOLN
INTRAVENOUS | Status: DC | PRN
  Administered 2017-07-22: 40 mg via INTRAVENOUS

## 2017-07-22 MED ORDER — ENOXAPARIN SODIUM 30 MG/0.3ML ~~LOC~~ SOLN
30.0000 mg | Freq: Two times a day (BID) | SUBCUTANEOUS | Status: DC
  Administered 2017-07-23 – 2017-07-24 (×3): 30 mg via SUBCUTANEOUS
  Filled 2017-07-22 (×3): qty 0.3

## 2017-07-22 MED ORDER — INFLUENZA VAC SPLIT QUAD 0.5 ML IM SUSY
0.5000 mL | PREFILLED_SYRINGE | INTRAMUSCULAR | Status: AC
  Administered 2017-07-23: 0.5 mL via INTRAMUSCULAR
  Filled 2017-07-22: qty 0.5

## 2017-07-22 MED ORDER — PROPOFOL 10 MG/ML IV BOLUS
INTRAVENOUS | Status: AC
  Filled 2017-07-22: qty 20

## 2017-07-22 MED ORDER — FLEET ENEMA 7-19 GM/118ML RE ENEM: 1.0000 | ENEMA | Freq: Once | RECTAL | Status: DC | PRN

## 2017-07-22 MED ORDER — FENTANYL CITRATE (PF) 100 MCG/2ML IJ SOLN
INTRAMUSCULAR | Status: AC
  Administered 2017-07-22: 25 ug via INTRAVENOUS
  Filled 2017-07-22: qty 2

## 2017-07-22 MED ORDER — ONDANSETRON HCL 4 MG/2ML IJ SOLN
INTRAMUSCULAR | Status: AC
  Filled 2017-07-22: qty 2

## 2017-07-22 MED ORDER — BUPIVACAINE HCL (PF) 0.5 % IJ SOLN
INTRAMUSCULAR | Status: DC | PRN
  Administered 2017-07-22: 2 mL

## 2017-07-22 MED ORDER — LACTATED RINGERS IV SOLN
INTRAVENOUS | Status: DC | PRN
  Administered 2017-07-22: 10:00:00 via INTRAVENOUS

## 2017-07-22 MED ORDER — PANTOPRAZOLE SODIUM 40 MG PO TBEC
40.0000 mg | DELAYED_RELEASE_TABLET | Freq: Two times a day (BID) | ORAL | Status: DC
  Administered 2017-07-22 – 2017-07-24 (×4): 40 mg via ORAL
  Filled 2017-07-22 (×4): qty 1

## 2017-07-22 MED ORDER — PROPOFOL 500 MG/50ML IV EMUL
INTRAVENOUS | Status: DC | PRN
  Administered 2017-07-22: 100 ug/kg/min via INTRAVENOUS

## 2017-07-22 MED ORDER — ALUM & MAG HYDROXIDE-SIMETH 200-200-20 MG/5ML PO SUSP: 30.0000 mL | ORAL | Status: DC | PRN

## 2017-07-22 MED ORDER — TETRACAINE HCL 1 % IJ SOLN
INTRAMUSCULAR | Status: AC
  Filled 2017-07-22: qty 2

## 2017-07-22 MED ORDER — SODIUM CHLORIDE 0.9 % IV SOLN
INTRAVENOUS | Status: DC
  Administered 2017-07-22: 07:00:00 via INTRAVENOUS

## 2017-07-22 SURGICAL SUPPLY — 56 items
BLADE CLIPPER SURG (BLADE) ×2 IMPLANT
BLADE DRUM FLTD (BLADE) ×2 IMPLANT
BLADE SAW 1 (BLADE) ×2 IMPLANT
CANISTER SUCT 1200ML W/VALVE (MISCELLANEOUS) ×2 IMPLANT
CANISTER SUCT 3000ML PPV (MISCELLANEOUS) ×4 IMPLANT
CAPT HIP TOTAL 2 ×2 IMPLANT
CARTRIDGE OIL MAESTRO DRILL (MISCELLANEOUS) ×1 IMPLANT
CATH FOL LEG HOLDER (MISCELLANEOUS) ×2 IMPLANT
CATH TRAY METER 16FR LF (MISCELLANEOUS) ×2 IMPLANT
DIFFUSER MAESTRO (MISCELLANEOUS) ×2 IMPLANT
DRAPE INCISE IOBAN 66X60 STRL (DRAPES) ×2 IMPLANT
DRAPE SHEET LG 3/4 BI-LAMINATE (DRAPES) ×2 IMPLANT
DRSG DERMACEA 8X12 NADH (GAUZE/BANDAGES/DRESSINGS) ×2 IMPLANT
DRSG OPSITE POSTOP 4X12 (GAUZE/BANDAGES/DRESSINGS) IMPLANT
DRSG OPSITE POSTOP 4X14 (GAUZE/BANDAGES/DRESSINGS) ×2 IMPLANT
DRSG TEGADERM 4X4.75 (GAUZE/BANDAGES/DRESSINGS) ×2 IMPLANT
DURAPREP 26ML APPLICATOR (WOUND CARE) ×2 IMPLANT
ELECT BLADE 6.5 EXT (BLADE) ×2 IMPLANT
ELECT CAUTERY BLADE 6.4 (BLADE) ×2 IMPLANT
EVACUATOR 1/8 PVC DRAIN (DRAIN) ×2 IMPLANT
GLOVE BIO SURGEON STRL SZ 6.5 (GLOVE) ×6 IMPLANT
GLOVE BIOGEL M STRL SZ7.5 (GLOVE) ×4 IMPLANT
GLOVE BIOGEL PI IND STRL 7.0 (GLOVE) ×3 IMPLANT
GLOVE BIOGEL PI IND STRL 9 (GLOVE) ×1 IMPLANT
GLOVE BIOGEL PI INDICATOR 7.0 (GLOVE) ×3
GLOVE BIOGEL PI INDICATOR 9 (GLOVE) ×1
GLOVE INDICATOR 8.0 STRL GRN (GLOVE) ×4 IMPLANT
GLOVE SURG SYN 9.0  PF PI (GLOVE) ×1
GLOVE SURG SYN 9.0 PF PI (GLOVE) ×1 IMPLANT
GOWN STRL REUS W/ TWL LRG LVL3 (GOWN DISPOSABLE) ×3 IMPLANT
GOWN STRL REUS W/TWL 2XL LVL3 (GOWN DISPOSABLE) ×2 IMPLANT
GOWN STRL REUS W/TWL LRG LVL3 (GOWN DISPOSABLE) ×3
HOOD PEEL AWAY FLYTE STAYCOOL (MISCELLANEOUS) ×4 IMPLANT
KIT RM TURNOVER STRD PROC AR (KITS) ×2 IMPLANT
NDL SAFETY 18GX1.5 (NEEDLE) ×2 IMPLANT
NS IRRIG 500ML POUR BTL (IV SOLUTION) ×2 IMPLANT
OIL CARTRIDGE MAESTRO DRILL (MISCELLANEOUS) ×2
PACK HIP PROSTHESIS (MISCELLANEOUS) ×2 IMPLANT
PIN STEIN THRED 5/32 (Pin) ×2 IMPLANT
PULSAVAC PLUS IRRIG FAN TIP (DISPOSABLE) ×2
SOL .9 NS 3000ML IRR  AL (IV SOLUTION) ×1
SOL .9 NS 3000ML IRR UROMATIC (IV SOLUTION) ×1 IMPLANT
SOL PREP PVP 2OZ (MISCELLANEOUS) ×2
SOLUTION PREP PVP 2OZ (MISCELLANEOUS) ×1 IMPLANT
SPONGE DRAIN TRACH 4X4 STRL 2S (GAUZE/BANDAGES/DRESSINGS) ×2 IMPLANT
STAPLER SKIN PROX 35W (STAPLE) ×2 IMPLANT
SUT ETHIBOND #5 BRAIDED 30INL (SUTURE) ×2 IMPLANT
SUT VIC AB 0 CT1 36 (SUTURE) ×4 IMPLANT
SUT VIC AB 1 CT1 36 (SUTURE) ×4 IMPLANT
SUT VIC AB 2-0 CT1 27 (SUTURE) ×1
SUT VIC AB 2-0 CT1 TAPERPNT 27 (SUTURE) ×1 IMPLANT
SYR 20CC LL (SYRINGE) ×2 IMPLANT
TAPE ADH 3 LX (MISCELLANEOUS) ×2 IMPLANT
TAPE TRANSPORE STRL 2 31045 (GAUZE/BANDAGES/DRESSINGS) ×4 IMPLANT
TIP FAN IRRIG PULSAVAC PLUS (DISPOSABLE) ×1 IMPLANT
TOWEL OR 17X26 4PK STRL BLUE (TOWEL DISPOSABLE) IMPLANT

## 2017-07-22 NOTE — H&P (Signed)
The patient has been re-examined, and the chart reviewed, and there have been no interval changes to the documented history and physical.    The risks, benefits, and alternatives have been discussed at length. The patient expressed understanding of the risks benefits and agreed with plans for surgical intervention.  Judith Demps P. Mahsa Hanser, Jr. M.D.    

## 2017-07-22 NOTE — Anesthesia Procedure Notes (Signed)
Date/Time: 07/22/2017 7:28 AM Performed by: Hedda Slade Pre-anesthesia Checklist: Patient identified, Emergency Drugs available, Suction available and Patient being monitored Patient Re-evaluated:Patient Re-evaluated prior to induction Oxygen Delivery Method: Simple face mask

## 2017-07-22 NOTE — Anesthesia Preprocedure Evaluation (Signed)
Anesthesia Evaluation  Patient identified by MRN, date of birth, ID band Patient awake    Reviewed: Allergy & Precautions, NPO status , Patient's Chart, lab work & pertinent test results  History of Anesthesia Complications Negative for: history of anesthetic complications  Airway Mallampati: IV  TM Distance: >3 FB Neck ROM: Full    Dental no notable dental hx.    Pulmonary asthma , neg sleep apnea, neg COPD, former smoker,    breath sounds clear to auscultation- rhonchi (-) wheezing      Cardiovascular Exercise Tolerance: Good hypertension, (-) CAD, (-) Past MI and (-) Cardiac Stents  Rhythm:Regular Rate:Normal - Systolic murmurs and - Diastolic murmurs    Neuro/Psych negative neurological ROS  negative psych ROS   GI/Hepatic negative GI ROS, Neg liver ROS,   Endo/Other  diabetes, Oral Hypoglycemic Agents  Renal/GU negative Renal ROS     Musculoskeletal  (+) Arthritis ,   Abdominal (+) + obese,   Peds  Hematology negative hematology ROS (+)   Anesthesia Other Findings Past Medical History: No date: Arthritis No date: Diabetes mellitus without complication (HCC) No date: DVT (deep venous thrombosis) (HCC)     Comment:  Lt leg No date: Dyspnea No date: Elevated lipids No date: Hypertension No date: Lower extremity edema   Reproductive/Obstetrics negative OB ROS                             Anesthesia Physical Anesthesia Plan  ASA: III  Anesthesia Plan: Spinal   Post-op Pain Management:    Induction:   PONV Risk Score and Plan: 1 and Ondansetron and Propofol infusion  Airway Management Planned: Natural Airway  Additional Equipment:   Intra-op Plan:   Post-operative Plan:   Informed Consent: I have reviewed the patients History and Physical, chart, labs and discussed the procedure including the risks, benefits and alternatives for the proposed anesthesia with the patient  or authorized representative who has indicated his/her understanding and acceptance.   Dental advisory given  Plan Discussed with: Anesthesiologist and CRNA  Anesthesia Plan Comments:         Lab Results  Component Value Date   WBC 5.7 07/10/2017   HGB 15.5 07/10/2017   HCT 43.8 07/10/2017   MCV 92.3 07/10/2017   PLT 222 07/10/2017     Anesthesia Quick Evaluation

## 2017-07-22 NOTE — Anesthesia Procedure Notes (Signed)
Spinal  Patient location during procedure: OR Start time: 07/22/2017 7:19 AM End time: 07/22/2017 7:24 AM Staffing Anesthesiologist: Randa Lynn AMY Resident/CRNA: Hedda Slade Performed: resident/CRNA  Preanesthetic Checklist Completed: patient identified, site marked, surgical consent, pre-op evaluation, timeout performed, IV checked, risks and benefits discussed and monitors and equipment checked Spinal Block Patient position: sitting Prep: ChloraPrep Patient monitoring: heart rate, continuous pulse ox, blood pressure and cardiac monitor Approach: midline Location: L4-5 Injection technique: single-shot Needle Needle type: Introducer and Pencil-Tip  Needle gauge: 24 G Needle length: 9 cm Additional Notes Negative paresthesia. Negative blood return. Positive free-flowing CSF. Expiration date of kit checked and confirmed. Patient tolerated procedure well, without complications.

## 2017-07-22 NOTE — Care Management (Signed)
Per [facility] patient is currently on Lovenox. Per standing order, Dr. Marry Guan requests price/authoriztation to be obtained by [facility] prior to discharge to home. Per Dr. Skip Estimable verbal order: Lovenox (generic okay too) 40mg  injection daily for 14 days- no refills called in to patient's [place] [phone]. RNCM will continue to follow. PT pending.

## 2017-07-22 NOTE — Transfer of Care (Signed)
Immediate Anesthesia Transfer of Care Note  Patient: Christopher Marsh  Procedure(s) Performed: Procedure(s): TOTAL HIP ARTHROPLASTY (Left)  Patient Location: PACU  Anesthesia Type:Spinal  Level of Consciousness: awake, alert  and oriented  Airway & Oxygen Therapy: Patient Spontanous Breathing and Patient connected to face mask oxygen  Post-op Assessment: Report given to RN and Post -op Vital signs reviewed and stable  Post vital signs: Reviewed and stable  Last Vitals:  Vitals:   07/22/17 0611 07/22/17 1138  BP: (!) 161/64 114/65  Pulse: 80 70  Resp: 14 16  Temp: 36.9 C (!) 36.3 C  SpO2: 98% 99%    Last Pain:  Vitals:   07/22/17 1138  TempSrc: Temporal  PainSc:          Complications: No apparent anesthesia complications

## 2017-07-22 NOTE — Op Note (Signed)
OPERATIVE NOTE  DATE OF SURGERY:  07/22/2017  PATIENT NAME:  Christopher Marsh   DOB: August 11, 1961  MRN: [id number]  PRE-OPERATIVE DIAGNOSIS: Degenerative arthrosis of the left hip, primary  POST-OPERATIVE DIAGNOSIS:  Same  PROCEDURE:  Left total hip arthroplasty  SURGEON:  Marciano Sequin. M.D.  ASSISTANT:  Vance Peper, PA (present and scrubbed throughout the case, critical for assistance with exposure, retraction, instrumentation, and closure)  ANESTHESIA: spinal  ESTIMATED BLOOD LOSS: 200 mL  FLUIDS REPLACED: 1100 mL of crystalloid  DRAINS: 2 medium drains to a Hemovac reservoir  IMPLANTS UTILIZED: DePuy 15 mm large stature AML femoral stem, 60 mm OD Pinnacle 100 acetabular component, +4 mm neutral Pinnacle Marathon polyethylene insert, and a 36 mm M-SPEC +1.5 mm hip ball  INDICATIONS FOR SURGERY: Christopher Marsh is a 56 y.o. year old male with a long history of progressive hip and groin  pain. X-rays demonstrated severe degenerative changes. The patient had not seen any significant improvement despite conservative nonsurgical intervention. After discussion of the risks and benefits of surgical intervention, the patient expressed understanding of the risks benefits and agree with plans for total hip arthroplasty.   The risks, benefits, and alternatives were discussed at length including but not limited to the risks of infection, bleeding, nerve injury, stiffness, blood clots, the need for revision surgery, limb length inequality, dislocation, cardiopulmonary complications, among others, and they were willing to proceed.  PROCEDURE IN DETAIL: The patient was brought into the operating room and, after adequate spinal anesthesia was achieved, the patient was placed in a right lateral decubitus position. Axillary roll was placed and all bony prominences were well-padded. The patient's left hip was cleaned and prepped with alcohol and DuraPrep and draped in the usual sterile fashion. A  "timeout" was performed as per usual protocol. A lateral curvilinear incision was made gently curving towards the posterior superior iliac spine. The IT band was incised in line with the skin incision and the fibers of the gluteus maximus were split in line. The piriformis tendon was identified, skeletonized, and incised at its insertion to the proximal femur and reflected posteriorly. A T type posterior capsulotomy was performed. Prior to dislocation of the femoral head, a threaded Steinmann pin was inserted through a separate stab incision into the pelvis superior to the acetabulum and bent in the form of a stylus so as to assess limb length and hip offset throughout the procedure. The femoral head was then dislocated posteriorly. Inspection of the femoral head demonstrated severe degenerative changes with full-thickness loss of articular cartilage. The femoral neck cut was performed using an oscillating saw. The anterior capsule was elevated off of the femoral neck using a periosteal elevator. Attention was then directed to the acetabulum. The remnant of the labrum was excised using electrocautery. Inspection of the acetabulum also demonstrated significant degenerative changes. The acetabulum was reamed in sequential fashion up to a 59 mm diameter. Good punctate bleeding bone was encountered. A 60 mm Pinnacle 100 acetabular component was positioned and impacted into place. Good scratch fit was appreciated. A +69mm neutral polyethylene trial was inserted.  Attention was then directed to the proximal femur. A hole for reaming of the proximal femoral canal was created using a high-speed burr. The femoral canal was reamed in sequential fashion up to a 14.5 mm diameter. This allowed for approximately 6 cm of scratch fit. It was elected to ream up to a 15 mm diameter to allow for a line-to-line fit. Serial broaches were  inserted up to a 15 mm large stature femoral broach. Calcar region was planed and a trial reduction  was performed using a 36 mm hip ball with a +1.5 mm neck length. Good equalization of limb lengths and hip offset was appreciated and excellent stability was noted both anteriorly and posteriorly. Trial components were removed. The acetabular shell was irrigated with copious amounts of normal saline with antibiotic solution and suctioned dry. A +4 mm neutral Pinnacle Marathon polyethylene insert was positioned and impacted into place. Next, a 15 mm large stature AML femoral stem was positioned and impacted into place. Excellent scratch fit was appreciated. A trial reduction was again performed with a 36 mm hip ball with a +1.5 mm neck length. Again, good equalization of limb lengths was appreciated and excellent stability appreciated both anteriorly and posteriorly. The hip was then dislocated and the trial hip ball was removed. The Morse taper was cleaned and dried. A 36 mm M-SPEC hip ball with a +1.5 mm neck length was placed on the trunnion and impacted into place. The hip was then reduced and placed through range of motion. Excellent stability was appreciated both anteriorly and posteriorly.  The wound was irrigated with copious amounts of normal saline with antibiotic solution and suctioned dry. Good hemostasis was appreciated. The posterior capsulotomy was repaired using #5 Ethibond. Piriformis tendon was reapproximated to the undersurface of the gluteus medius tendon using #5 Ethibond. Two medium drains were placed in the wound bed and brought out through separate stab incisions to be attached to a Hemovac reservoir. The IT band was reapproximated using interrupted sutures of #1 Vicryl. Subcutaneous tissue was approximated using first #0 Vicryl followed by #2-0 Vicryl. The skin was closed with skin staples.  The patient tolerated the procedure well and was transported to the recovery room in stable condition.   Marciano Sequin., M.D.

## 2017-07-22 NOTE — Anesthesia Post-op Follow-up Note (Signed)
Anesthesia QCDR form completed.        

## 2017-07-22 NOTE — Discharge Instructions (Signed)
Instructions after Total Hip Replacement ° ° °  Cadyn Rodger P. Shaun Runyon, Jr., M.D.    ° Dept. of Orthopaedics & Sports Medicine ° [facility] ° [address] ° , [place]  [address] ° Phone: [phone]   Fax: [phone] ° °  °DIET: °• Drink plenty of non-alcoholic fluids. °• Resume your normal diet. Include foods high in fiber. ° °ACTIVITY:  °• You may use crutches or a walker with weight-bearing as tolerated, unless instructed otherwise. °• You may be weaned off of the walker or crutches by your Physical Therapist.  °• Do NOT reach below the level of your knees or cross your legs until allowed.    °• Continue doing gentle exercises. Exercising will reduce the pain and swelling, increase motion, and prevent muscle weakness.   °• Please continue to use the TED compression stockings for 6 weeks. You may remove the stockings at night, but should reapply them in the morning. °• Do not drive or operate any equipment until instructed. ° °WOUND CARE:  °• Continue to use ice packs periodically to reduce pain and swelling. °• Keep the incision clean and dry. °• You may bathe or shower after the staples are removed at the first office visit following surgery. ° °MEDICATIONS: °• You may resume your regular medications. °• Please take the pain medication as prescribed on the medication. °• Do not take pain medication on an empty stomach. °• You have been given a prescription for a blood thinner to prevent blood clots. Please take the medication as instructed. (NOTE: After completing a 2 week course of Lovenox, take one Enteric-coated aspirin once a day.) °• Pain medications and iron supplements can cause constipation. Use a stool softener (Senokot or Colace) on a daily basis and a laxative (dulcolax or miralax) as needed. °• Do not drive or drink alcoholic beverages when taking pain medications. ° °CALL THE OFFICE FOR: °• Temperature above 101 degrees °• Excessive bleeding or drainage on the dressing. °• Excessive  swelling, coldness, or paleness of the toes. °• Persistent nausea and vomiting. ° °FOLLOW-UP:  °• You should have an appointment to return to the office in 6 weeks after surgery. °• Arrangements have been made for continuation of Physical Therapy (either home therapy or outpatient therapy). °  °

## 2017-07-22 NOTE — Evaluation (Signed)
Physical Therapy Evaluation Patient Details Name: Christopher Marsh MRN: [id number] DOB: 1961/09/01 Today's Date: 07/22/2017   History of Present Illness  Pt isa 56 y/o M s/p L THA.  Pt's PMH includes DVT.  Clinical Impression  Pt is s/p THA resulting in the deficits listed below (see PT Problem List) Mr. Oberhaus was Independent PTA with no h/o recent falls.  He currently requires min assist for bed mobility and min guard assist for transfers and to ambulate 40 ft with RW.  Pt is eager to regain independence.  Pt will benefit from skilled PT to increase their independence and safety with mobility to allow discharge to the venue listed below.     Follow Up Recommendations Outpatient PT;Supervision for mobility/OOB    Equipment Recommendations  Rolling walker with 5" wheels;3in1 (PT)    Recommendations for Other Services       Precautions / Restrictions Precautions Precautions: Fall;Posterior Hip Precaution Booklet Issued: Yes (comment) Precaution Comments: Reviewed hip precautions with pt at start of session and pt was able to recall 2/3 hip precautions at end of session without cues.  Restrictions Weight Bearing Restrictions: Yes LLE Weight Bearing: Weight bearing as tolerated      Mobility  Bed Mobility Overal bed mobility: Needs Assistance Bed Mobility: Supine to Sit     Supine to sit: HOB elevated;Min assist     General bed mobility comments: Min assist to advance LLE to EOB with cues for sequencing to adhere to hip precautions.  Pt uses bed rail with increased effort.   Transfers Overall transfer level: Needs assistance Equipment used: Rolling walker (2 wheeled) Transfers: Sit to/from Stand Sit to Stand: Min guard         General transfer comment: Cues for proper hand placement and safe technique.  Pt is slow to stand.  Cues to reach back for armrests when preparing to sit.   Ambulation/Gait Ambulation/Gait assistance: Min guard Ambulation Distance (Feet): 40  Feet Assistive device: Rolling walker (2 wheeled) Gait Pattern/deviations: Step-to pattern;Decreased stride length;Decreased weight shift to left;Decreased stance time - left;Decreased step length - right;Antalgic;Trunk flexed Gait velocity: decreased Gait velocity interpretation: Below normal speed for age/gender General Gait Details: Pt heavily WBing through BUEs onto RW with cues for upright posture and forward gaze.  Ambualtory distance limited by pain in L hip.   Stairs            Wheelchair Mobility    Modified Rankin (Stroke Patients Only)       Balance Overall balance assessment: Needs assistance Sitting-balance support: Feet supported;No upper extremity supported Sitting balance-Leahy Scale: Good     Standing balance support: No upper extremity supported;During functional activity Standing balance-Leahy Scale: Poor Standing balance comment: Pt relies on UE support for static and dynamic activities                             Pertinent Vitals/Pain Pain Assessment: 0-10 Pain Score: 6  Pain Location: L hip Pain Descriptors / Indicators: Aching;Discomfort Pain Intervention(s): Limited activity within patient's tolerance;Monitored during session;Repositioned;Ice applied;RN gave pain meds during session    [place] expects to be discharged to:: Private residence Living Arrangements: Spouse/significant other Available Help at Discharge: Family;Available 24 hours/day (wife to take off work for a few days at d/c) Type of Home: House Home Access: Stairs to enter Entrance Stairs-Rails: Chemical engineer of Steps: [place]: Two level;Bed/bath upstairs Home Equipment: None  Prior Function Level of Independence: Independent         Comments: Pt independent with all aspects of mobility.  Does not utilize any AD at baseline. Denies any falls in the past 6 months.  Pt works full-time on his Geographical information systems officer at homes, buildings.       Hand Dominance   Dominant Hand: Right    Extremity/Trunk Assessment   Upper Extremity Assessment Upper Extremity Assessment: Overall WFL for tasks assessed    Lower Extremity Assessment Lower Extremity Assessment: LLE deficits/detail LLE Deficits / Details: Strength grossly 3/5 limited by pain, stiffness, and posterior precautions LLE: Unable to fully assess due to pain    Cervical / Trunk Assessment Cervical / Trunk Assessment: Normal  Communication   Communication: No difficulties  Cognition Arousal/Alertness: Awake/alert Behavior During Therapy: WFL for tasks assessed/performed Overall Cognitive Status: Within Functional Limits for tasks assessed                                        General Comments      Exercises Total Joint Exercises Ankle Circles/Pumps: AROM;Both;10 reps;Supine Quad Sets: Strengthening;Both;10 reps;Supine Gluteal Sets: Strengthening;Both;10 reps;Supine Long Arc Quad: AROM;Left;10 reps;Seated   Assessment/Plan    PT Assessment Patient needs continued PT services  PT Problem List Decreased strength;Decreased range of motion;Decreased activity tolerance;Decreased balance;Decreased mobility;Decreased knowledge of use of DME;Decreased safety awareness;Decreased knowledge of precautions;Pain       PT Treatment Interventions DME instruction;Gait training;Stair training;Functional mobility training;Therapeutic activities;Therapeutic exercise;Balance training;Neuromuscular re-education;Patient/family education;Modalities    PT Goals (Current goals can be found in the Care Plan section)  Acute Rehab PT Goals Patient Stated Goal: to regain independence PT Goal Formulation: With patient Time For Goal Achievement: 08/05/17 Potential to Achieve Goals: Good    Frequency BID   Barriers to discharge Inaccessible home environment Flight of steps to get to bedroom on second floor    Co-evaluation                AM-PAC PT "6 Clicks" Daily Activity  Outcome Measure Difficulty turning over in bed (including adjusting bedclothes, sheets and blankets)?: Unable Difficulty moving from lying on back to sitting on the side of the bed? : Unable Difficulty sitting down on and standing up from a chair with arms (e.g., wheelchair, bedside commode, etc,.)?: A Lot Help needed moving to and from a bed to chair (including a wheelchair)?: A Little Help needed walking in hospital room?: A Little Help needed climbing 3-5 steps with a railing? : A Lot 6 Click Score: 12    End of Session Equipment Utilized During Treatment: Gait belt Activity Tolerance: Patient tolerated treatment well;Patient limited by pain Patient left: in chair;with call bell/phone within reach;with chair alarm set;with family/visitor present Nurse Communication: Mobility status PT Visit Diagnosis: Pain;Unsteadiness on feet (R26.81);Muscle weakness (generalized) (M62.81);Other abnormalities of gait and mobility (R26.89) Pain - Right/Left: Left Pain - part of body: Hip    Time: 1419-1450 PT Time Calculation (min) (ACUTE ONLY): 31 min   Charges:   PT Evaluation $PT Eval Low Complexity: 1 Low PT Treatments $Therapeutic Exercise: 8-22 mins   PT G Codes:   PT G-Codes **NOT FOR INPATIENT CLASS** Functional Assessment Tool Used: AM-PAC 6 Clicks Basic Mobility;Clinical judgement Functional Limitation: Mobility: Walking and moving around Mobility: Walking and Moving Around Current Status (Z6109): At least 60 percent but less than 80 percent impaired, limited or  restricted Mobility: Walking and Moving Around Goal Status [phone]): At least 20 percent but less than 40 percent impaired, limited or restricted    Collie Siad PT, DPT 07/22/2017, 3:08 PM

## 2017-07-23 LAB — CBC
HEMATOCRIT: 37 % — AB (ref 40.0–52.0)
HEMOGLOBIN: 12.9 g/dL — AB (ref 13.0–18.0)
MCH: 31.8 pg (ref 26.0–34.0)
MCHC: 34.8 g/dL (ref 32.0–36.0)
MCV: 91.4 fL (ref 80.0–100.0)
Platelets: 184 10*3/uL (ref 150–440)
RBC: 4.05 MIL/uL — ABNORMAL LOW (ref 4.40–5.90)
RDW: 13.7 % (ref 11.5–14.5)
WBC: 7.4 10*3/uL (ref 3.8–10.6)

## 2017-07-23 LAB — BASIC METABOLIC PANEL
ANION GAP: 5 (ref 5–15)
BUN: 7 mg/dL (ref 6–20)
CHLORIDE: 104 mmol/L (ref 101–111)
CO2: 26 mmol/L (ref 22–32)
Calcium: 7.9 mg/dL — ABNORMAL LOW (ref 8.9–10.3)
Creatinine, Ser: 0.69 mg/dL (ref 0.61–1.24)
GFR calc Af Amer: >60 mL/min (ref >60)
GLUCOSE: 131 mg/dL — AB (ref 65–99)
POTASSIUM: 3.5 mmol/L (ref 3.5–5.1)
Sodium: 135 mmol/L (ref 135–145)

## 2017-07-23 LAB — GLUCOSE, CAPILLARY
GLUCOSE-CAPILLARY: 162 mg/dL — AB (ref 65–99)
Glucose-Capillary: 138 mg/dL — ABNORMAL HIGH (ref 65–99)
Glucose-Capillary: 154 mg/dL — ABNORMAL HIGH (ref 65–99)
Glucose-Capillary: 160 mg/dL — ABNORMAL HIGH (ref 65–99)

## 2017-07-23 MED ORDER — ENOXAPARIN SODIUM 30 MG/0.3ML ~~LOC~~ SOLN: 40.0000 mg | SUBCUTANEOUS | 0 refills | Status: DC

## 2017-07-23 MED ORDER — TRAMADOL HCL 50 MG PO TABS: 50.0000 mg | ORAL_TABLET | ORAL | 0 refills | Status: DC | PRN

## 2017-07-23 MED ORDER — OXYCODONE HCL 5 MG PO TABS: 5.0000 mg | ORAL_TABLET | ORAL | 0 refills | Status: DC | PRN

## 2017-07-23 NOTE — Evaluation (Signed)
Occupational Therapy Evaluation Patient Details Name: Christopher Marsh MRN: [id number] DOB: May 14, 1961 Today's Date: 07/23/2017    History of Present Illness Pt isa 56 y/o M s/p L THA.  Pt's PMH includes DVT.   Clinical Impression   Pt is 56 year old male s/p L THR(posterior total hip precautions) who lives at home with his spouse. Pt was independent in all ADLs prior to surgery and is eager to return to PLOF.  Pt is currently limited in functional ADLs due to pain, decreased ROM, and decreased strength. Pt able to verbalize 3/3 posterior total hip precautions with no verbal cues and able to maintain precautions throughout assessment. Pt requires set up and min guard for LB dressing and bathing skills due to pain and decreased AROM of LLE using AE after initial education/training in AE for self care skills with verbal instruction and visual demo. Pt requires min guard for functional transfers. Pt educated in functional mobility and safety using RW in order to negotiate home environment including kitchen, as pt enjoys cooking and eager to return to this meaningful occupation. Pt would benefit from continued skilled OT services for further training in assistive devices, functional mobility, and education in recommendations for home modifications to increase safety and prevent falls. Do not anticipate additional skilled OT needs following this hospitalization. Recommend pt return home upon discharge.      Follow Up Recommendations  No OT follow up    Equipment Recommendations  3 in 1 bedside commode    Recommendations for Other Services       Precautions / Restrictions Precautions Precautions: Fall;Posterior Hip Precaution Booklet Issued: No Precaution Comments: Pt able to recall 3/3 hip precautions with no verbal cues Restrictions Weight Bearing Restrictions: Yes LLE Weight Bearing: Weight bearing as tolerated      Mobility Bed Mobility Overal bed mobility:  (Unassessed, pt was found  and left in chair)             General bed mobility comments: deferred, pt up in recliner for session  Transfers Overall transfer level: Needs assistance Equipment used: Rolling walker (2 wheeled) Transfers: Sit to/from Stand Sit to Stand: Min guard         General transfer comment: Vc's for proper foot placement to maintain hip precautions   Balance Overall balance assessment: Needs assistance Sitting-balance support: Feet supported;Bilateral upper extremity supported Sitting balance-Leahy Scale: Good   Standing balance support: Bilateral upper extremity supported;During functional activity Standing balance-Leahy Scale: Poor Standing balance comment: poor +                           ADL either performed or assessed with clinical judgement   ADL Overall ADL's : Needs assistance/impaired               Lower Body Bathing Details (indicate cue type and reason): pt educated in use of shower chair/BSC for seated shower to maximize safety, pt verbalized understanding     Lower Body Dressing: Set up;With adaptive equipment;Adhering to hip precautions;Sitting/lateral leans;Sit to/from stand;Min guard Lower Body Dressing Details (indicate cue type and reason): pt educated in AE for LB dressing while maintaining precautions with verbal instruction and visual demonstration. Pt verbalized understanding. Min guard for transitional movements Toilet Transfer: Min guard;Regular Toilet;RW;Adhering to hip precautions Toilet Transfer Details (indicate cue type and reason): educated in [facility] over toilet at home to maximize comfort; pt verbalized understanding         Functional  mobility during ADLs: Min guard;Rolling walker General ADL Comments: Pt generally at supervision/set up level for ADL tasks, recommended pt have spouse to provide supervision support for tub transfers to maximize safety. Pt verbalized understanding.     Vision Baseline Vision/History: Wears  glasses Wears Glasses: Reading only Patient Visual Report: No change from baseline Vision Assessment?: No apparent visual deficits     Perception     Praxis      Pertinent Vitals/Pain Pain Assessment: 0-10 Pain Score: 4  Faces Pain Scale: Hurts a little bit Pain Location: L hip Pain Descriptors / Indicators: Discomfort Pain Intervention(s): Limited activity within patient's tolerance;Monitored during session;Premedicated before session     Hand Dominance Right   Extremity/Trunk Assessment Upper Extremity Assessment Upper Extremity Assessment: Overall WFL for tasks assessed   Lower Extremity Assessment Lower Extremity Assessment: Defer to PT evaluation;LLE deficits/detail LLE Deficits / Details: Strength grossly 3/5 limited by pain, stiffness, and posterior precautions   Cervical / Trunk Assessment Cervical / Trunk Assessment: Normal   Communication Communication Communication: No difficulties   Cognition Arousal/Alertness: Awake/alert Behavior During Therapy: WFL for tasks assessed/performed Overall Cognitive Status: Within Functional Limits for tasks assessed                                     General Comments       Exercises Other Exercises Other Exercises: Pt educated in home/routines modifications and falls prevention strategies to maximize safety and functional independence. Pt verbalized understanding.  Other Exercises: Pt educated in functional mobility with RW in kitchen environment to maximize safety and functional independence with verbal instruction and visual demo. Pt verbalized understanding. Other Exercises: Pt verbalized plan that he created with surgeon for negotiating stairs with spouse's support   Shoulder Instructions      Home Living Family/patient expects to be discharged to:: Private residence Living Arrangements: Spouse/significant other Available Help at Discharge: Family;Available 24 hours/day (wife taking off a few days  after d/c to support) Type of Home: House Home Access: Stairs to enter [employer] of Steps: 3 Entrance Stairs-Rails: Left;Right Home Layout: Two level;Bed/bath upstairs Alternate Level Stairs-Number of Steps: flight Alternate Level Stairs-Rails: Left;Right (left halfway up, right halfway up) Bathroom Shower/Tub: Tub/shower unit;Curtain   Biochemist, clinical: Standard     Home Equipment: Financial controller: Reacher        Prior Functioning/Environment Level of Independence: Independent        Comments: Pt independent with all aspects of mobility.  Does not utilize any AD at baseline. Denies any falls in the past 6 months.  Pt works full-time on his Environmental consultant at homes, buildings.          OT Problem List: Decreased strength;Pain;Decreased range of motion;Impaired balance (sitting and/or standing)      OT Treatment/Interventions: Self-care/ADL training;DME and/or AE instruction;Patient/family education    OT Goals(Current goals can be found in the care plan section) Acute Rehab OT Goals Patient Stated Goal: to regain independence OT Goal Formulation: With patient Time For Goal Achievement: 07/30/17 Potential to Achieve Goals: Good  OT Frequency: Min 1X/week   Barriers to D/C:            Co-evaluation              AM-PAC PT "6 Clicks" Daily Activity     Outcome Measure Help from another person eating meals?: None Help from another person taking care  of personal grooming?: None Help from another person toileting, which includes using toliet, bedpan, or urinal?: A Little Help from another person bathing (including washing, rinsing, drying)?: A Little Help from another person to put on and taking off regular upper body clothing?: None Help from another person to put on and taking off regular lower body clothing?: A Little 6 Click Score: 21   End of Session    Activity Tolerance: Patient tolerated treatment  well Patient left: in chair;with call bell/phone within reach;with chair alarm set  OT Visit Diagnosis: Other abnormalities of gait and mobility (R26.89)                Time: 8811-0315 OT Time Calculation (min): 19 min Charges:  OT General Charges $OT Visit: 1 Visit OT Evaluation $OT Eval Low Complexity: 1 Low OT Treatments $Self Care/Home Management : 8-22 mins G-Codes: OT G-codes **NOT FOR INPATIENT CLASS** Functional Assessment Tool Used: AM-PAC 6 Clicks Daily Activity;Clinical judgement Functional Limitation: Self care Self Care Current Status (X4585): At least 20 percent but less than 40 percent impaired, limited or restricted Self Care Goal Status (F2924): At least 1 percent but less than 20 percent impaired, limited or restricted   Jeni Salles, MPH, MS, OTR/L ascom [phone] 07/23/17, 10:08 AM

## 2017-07-23 NOTE — Progress Notes (Signed)
Clinical Social Worker (CSW) received SNF consult. PT is recommending outpatient PT. RN case manager aware of above. Please reconsult if future social work needs arise. CSW signing off.   Jaimin Krupka, LCSW [phone]  

## 2017-07-23 NOTE — Care Management Note (Signed)
Case Management Note  Patient Details  Name: Christopher Marsh MRN: [id number] Date of Birth: 1961/06/26  Subjective/Objective:                  RNCM met with patient to discuss discharge planning. He plans to return home with his wife. He states transportation will be an issue to meet outpatient PT needs and that per Vance Peper PA patient will need home health PT to meet his needs. Patient states that he prefers that Lovenox be transferred to [facility] [address] [phone] which has been done- cost $25. PT is recommending walker.Home health list provided to patient. He picked [facility].   Action/Plan: Referral to [facility] for home health PT. Lovenox transferred to [place]; [facility] notified. Front-wheeled walk[facility].   Expected Discharge Date:  07/24/17               Expected Discharge Plan:     In-House Referral:     Discharge planning Services  CM Consult  Post Acute Care Choice:  Home Health Choice offered to:  Patient  DME Arranged:    DME Agency:     HH Arranged:  PT [place]:  [facility] (now [facility]), [facility]  Status of Service:  In process, will continue to follow  If discussed at Long Length of Stay Meetings, dates discussed:    Additional Comments:  Marshell Garfinkel, RN 07/23/2017, 9:42 AM

## 2017-07-23 NOTE — Care Management (Signed)
Rolling walker has been delivered.

## 2017-07-23 NOTE — Progress Notes (Signed)
Pt is remaining alert and oriented. Pt sat up in chair until bed time and tolerated well.  Surgical dressing dry and intact. Iv infusing without difficulty. Foley patent and draining urine. Pt able to sleep in between care. Pain control with oral medication.

## 2017-07-23 NOTE — Discharge Summary (Signed)
Physician Discharge Summary  Patient ID: Christopher Marsh MRN: [id number] DOB/AGE: 02/03/1961 56 y.o.  Admit date: 07/22/2017 Discharge date: 07/24/2017  Admission Diagnoses:  primary osteoarthritis of left knee   Discharge Diagnoses: Patient Active Problem List   Diagnosis Date Noted  . Status post total replacement of hip 07/22/2017  . Essential hypertension 08/28/2016  . Actinic keratosis 11/30/2015  . Diabetes type 2, uncontrolled ([place]) 02/15/2015  . Morbid obesity ([place]) 06/18/2013  . Left hip pain 04/02/2012  . HSV (herpes simplex virus) infection 04/02/2012  . CONTACT DERMATITIS&OTHER ECZEMA DUE UNSPEC CAUSE 09/14/2009  . ERECTILE DYSFUNCTION 12/08/2008  . Venous (peripheral) insufficiency 12/08/2008  . HEPATITIS C, HX OF 12/08/2008  . Hyperlipemia 11/24/2008  . ESOPHAGITIS, REFLUX 11/18/2008    Past Medical History:  Diagnosis Date  . Arthritis   . Diabetes mellitus without complication ([place])   . DVT (deep venous thrombosis) ([facility])    Lt leg  . Dyspnea   . Elevated lipids   . Hypertension   . Lower extremity edema      Transfusion: no transfusion during this admission   Consultants (if any):   Discharged Condition: Improved  Hospital Course: Christopher Marsh is an 56 y.o. male who was admitted 07/22/2017 with a diagnosis of degenerative arthrosis of left hip and went to the operating room on 07/22/2017 and underwent the above named procedures.    Surgeries:Procedure(s): TOTAL HIP ARTHROPLASTY on 07/22/2017  PRE-OPERATIVE DIAGNOSIS: Degenerative arthrosis of the left hip, primary  POST-OPERATIVE DIAGNOSIS:  Same  PROCEDURE:  Left total hip arthroplasty  SURGEON:  Marciano Sequin. M.D.  ASSISTANT:  Vance Peper, PA (present and scrubbed throughout the case, critical for assistance with exposure, retraction, instrumentation, and closure)  ANESTHESIA: spinal  ESTIMATED BLOOD LOSS: 200 mL  FLUIDS REPLACED: 1100 mL of crystalloid  DRAINS: 2  medium drains to a Hemovac reservoir  IMPLANTS UTILIZED: DePuy 15 mm large stature AML femoral stem, 60 mm OD Pinnacle 100 acetabular component, +4 mm neutral Pinnacle Marathon polyethylene insert, and a 36 mm M-SPEC +1.5 mm hip ball  INDICATIONS FOR SURGERY: Christopher Marsh is a 56 y.o. year old male with a long history of progressive hip and groin  pain. X-rays demonstrated severe degenerative changes. The patient had not seen any significant improvement despite conservative nonsurgical intervention. After discussion of the risks and benefits of surgical intervention, the patient expressed understanding of the risks benefits and agree with plans for total hip arthroplasty.   The risks, benefits, and alternatives were discussed at length including but not limited to the risks of infection, bleeding, nerve injury, stiffness, blood clots, the need for revision surgery, limb length inequality, dislocation, cardiopulmonary complications, among others, and they were willing to proceed. Patient tolerated the surgery well. No complications .Patient was taken to PACU where she was stabilized and then transferred to the orthopedic floor.  Patient started on Lovenox 30 q 12 hrs. Foot pumps applied bilaterally at 80 mm hgb. Heels elevated off bed with rolled towels. No evidence of DVT. Calves non tender. Negative Homan. Physical therapy started on day #1 for gait training and transfer with OT starting on  day #1 for ADL and assisted devices. Patient has done well with therapy. Ambulated  Than 200 feet upon being discharged.  Patient's IV And Foley were discontinued on day #1 with Hemovac being discontinued on day #2. Dressing was changed on day 2 prior to patient being discharged   He was given perioperative antibiotics:  Anti-infectives  Start     Dose/Rate Route Frequency Ordered Stop   07/22/17 1330  ceFAZolin (ANCEF) IVPB 2g/100 mL premix     2 g 200 mL/hr over 30 Minutes Intravenous Every 6  hours 07/22/17 1240 07/23/17 1329   07/22/17 0600  ceFAZolin (ANCEF) 3 g in dextrose 5 % 50 mL IVPB     3 g 130 mL/hr over 30 Minutes Intravenous On call to O.R. 07/21/17 2218 07/22/17 1191    .  He was fitted with AV 1 compression foot pump devices, instructed on heel pumps, early ambulation, and fitted with TED stockings bilaterally for DVT prophylaxis.  He benefited maximally from the hospital stay and there were no complications.    Recent vital signs:  Vitals:   07/23/17 0422 07/23/17 0714  BP: (!) 159/58 (!) 157/67  Pulse: 67 70  Resp:  14  Temp: 98.8 F (37.1 C) 98 F (36.7 C)  SpO2: 97% 98%    Recent laboratory studies:  Lab Results  Component Value Date   HGB 12.9 (L) 07/23/2017   HGB 15.5 07/10/2017   HGB 16.3 08/10/2016   Lab Results  Component Value Date   WBC 7.4 07/23/2017   PLT 184 07/23/2017   Lab Results  Component Value Date   INR 0.98 07/10/2017   Lab Results  Component Value Date   NA 135 07/23/2017   K 3.5 07/23/2017   CL 104 07/23/2017   CO2 26 07/23/2017   BUN 7 07/23/2017   CREATININE 0.69 07/23/2017   GLUCOSE 131 (H) 07/23/2017    Discharge Medications:   Allergies as of 07/23/2017   No Known Allergies     Medication List    STOP taking these medications   aspirin 81 MG tablet     TAKE these medications   acyclovir 400 MG tablet Commonly known as:  ZOVIRAX Take 1 tablet (400 mg total) by mouth 3 (three) times daily as needed.   amLODipine 5 MG tablet Commonly known as:  NORVASC Take 1 tablet (5 mg total) by mouth daily.   benazepril 40 MG tablet Commonly known as:  LOTENSIN 1-1/2 tablets daily in the morning   enoxaparin 30 MG/0.3ML injection Commonly known as:  LOVENOX Inject 0.4 mLs (40 mg total) into the skin daily.   furosemide 20 MG tablet Commonly known as:  LASIX Take 1 tablet (20 mg total) by mouth daily.   glucose blood test strip 1 each by Other route as needed for other. accu chek aviva plusUse as  instructed   metFORMIN 500 MG tablet Commonly known as:  GLUCOPHAGE Take 1 tablet (500 mg total) by mouth daily with breakfast.   oxyCODONE 5 MG immediate release tablet Commonly known as:  Oxy IR/ROXICODONE Take 1-2 tablets (5-10 mg total) by mouth every 4 (four) hours as needed for severe pain.   pravastatin 20 MG tablet Commonly known as:  PRAVACHOL Take 1 tablet (20 mg total) by mouth daily.   tadalafil 20 MG tablet Commonly known as:  CIALIS Take 0.5-1 tablets (10-20 mg total) by mouth every other day as needed for erectile dysfunction.   traMADol 50 MG tablet Commonly known as:  ULTRAM Take 50 mg by mouth every 6 (six) hours as needed for moderate pain (Pt does take 1 tablet every morning and then as needed through the day). What changed:  Another medication with the same name was added. Make sure you understand how and when to take each.   traMADol 50 MG tablet Commonly  known as:  ULTRAM Take 1-2 tablets (50-100 mg total) by mouth every 4 (four) hours as needed for moderate pain. What changed:  You were already taking a medication with the same name, and this prescription was added. Make sure you understand how and when to take each.   triamcinolone cream 0.1 % Commonly known as:  KENALOG Apply 1 application topically 2 (two) times daily as needed.            Durable Medical Equipment        Start     Ordered   07/22/17 1241  DME Walker rolling  Once    Question:  Patient needs a walker to treat with the following condition  Answer:  S/P total hip arthroplasty   07/22/17 1240   07/22/17 1241  DME Bedside commode  Once    Question:  Patient needs a bedside commode to treat with the following condition  Answer:  S/P total hip arthroplasty   07/22/17 1240       Discharge Care Instructions        Start     Ordered   07/23/17 0000  traMADol (ULTRAM) 50 MG tablet  Every 4 hours PRN     07/23/17 0746   07/23/17 0000  enoxaparin (LOVENOX) 30 MG/0.3ML injection   Every 24 hours     07/23/17 0746   07/23/17 0000  oxyCODONE (OXY IR/ROXICODONE) 5 MG immediate release tablet  Every 4 hours PRN     07/23/17 0746   07/23/17 0000  Increase activity slowly     07/23/17 0746   07/23/17 0000  Diet - low sodium heart healthy     07/23/17 0746      Diagnostic Studies: Dg Hip Port Unilat With Pelvis 1v Left  Result Date: 07/22/2017 CLINICAL DATA:  Postop left total hip arthroplasty. EXAM: DG HIP (WITH OR WITHOUT PELVIS) 1V PORT LEFT COMPARISON:  None. FINDINGS: 1156 hours. Portable AP view of the lower pelvis and cross-table lateral view of the left hip demonstrates interval left total hip arthroplasty with a non screw fixed acetabular component. The hardware is well positioned. There is no evidence of acute fracture or dislocation. A surgical drain is in place. There is mild periarticular soft tissue emphysema. Underlying right hip degenerative changes are noted. IMPRESSION: No demonstrated complication following left total hip arthroplasty. Electronically Signed   By: Richardean Sale M.D.   On: 07/22/2017 12:24    Disposition:   Discharge Instructions    Diet - low sodium heart healthy    Complete by:  As directed    Increase activity slowly    Complete by:  As directed       Follow-up Information    Hooten, Laurice Record, MD On 09/03/2017.   Specialty:  Orthopedic Surgery Why:  at 9:45am Contact information: [place] [address] [phone]            Signed: Watt Climes 07/23/2017, 7:47 AM

## 2017-07-23 NOTE — Progress Notes (Signed)
Physical Therapy Treatment Patient Details Name: Christopher Marsh MRN: [id number] DOB: [birth year] Today's Date: 07/23/2017    History of Present Illness Pt isa 56 y/o M s/p L THA.  Pt's PMH includes DVT.    PT Comments    Pt was able to progress LE exercises and walk with RW 241ft with CGA x1 plus a chair follow, but not required for session. With vc's to relax UE support on RW, pt able to use a step-through gait pattern. Vc's were also given to normalize gait pattern with a heel strike. Pt comments he has been performing his exercises and is ready to go home after he is able to have a BM. PT plans to practice stairs with pt before he d/c's home.    Follow Up Recommendations  Outpatient PT;Supervision for mobility/OOB     Equipment Recommendations  Rolling walker with 5" wheels;3in1 (PT)    Recommendations for Other Services       Precautions / Restrictions Precautions Precautions: Fall;Posterior Hip Precaution Comments: Reviewed hip precautions with pt at start of session and pt was able to recall 3/3 hip precautions at end of session without cues.  Restrictions Weight Bearing Restrictions: Yes LLE Weight Bearing: Weight bearing as tolerated    Mobility  Bed Mobility Overal bed mobility:  (Unassessed, pt was found and left in chair)                Transfers Overall transfer level: Needs assistance Equipment used: Rolling walker (2 wheeled) Transfers: Sit to/from Stand Sit to Stand: Min guard         General transfer comment: Vc's for proper foot placement to maintain hip precautions; Stand>sit performed x2 with vc's for foot placement, to reach back for armrests and descend slowly. Pt demonstrated difficulty with controlled stand>sit; improved slightly with second trial.  Ambulation/Gait Ambulation/Gait assistance: Min guard Ambulation Distance (Feet): 260 Feet Assistive device: Rolling walker (2 wheeled) Gait Pattern/deviations: Step-to pattern;Step-through  pattern;Decreased stride length;Decreased weight shift to left;Antalgic;Trunk flexed     General Gait Details: Pt heavily WB through BUEs onto RW with step-to pattern; vc's to relax arms and put more weight through legs encouraged pt to utilize step-through pattern. Vc's required for upright posture and heel strike.   Stairs            Wheelchair Mobility    Modified Rankin (Stroke Patients Only)       Balance Overall balance assessment: Needs assistance Sitting-balance support: Feet supported;Bilateral upper extremity supported Sitting balance-Leahy Scale: Good Sitting balance - Comments: Pt able to perform LAQ and ankle pump exercises in chair with BUE's supported on armrests without any loss of balance   Standing balance support: Bilateral upper extremity supported;During functional activity Standing balance-Leahy Scale: Poor Standing balance comment: Pt requires BUE support on RW for standing                            Cognition Arousal/Alertness: Awake/alert Behavior During Therapy: WFL for tasks assessed/performed Overall Cognitive Status: Within Functional Limits for tasks assessed                                        Exercises Total Joint Exercises Ankle Circles/Pumps: AROM;Both;Seated;15 reps Quad Sets: Strengthening;Both;10 reps;Seated (10 reps x2) Gluteal Sets: Strengthening;Both;10 reps;Seated (10 reps x2) Long Arc Quad: AROM;Both;10 reps;Seated (10 reps x2)  General Comments        Pertinent Vitals/Pain Pain Assessment: Faces (Pt reports feeling "clammy" after eating breakfast, decreased by end of session) Faces Pain Scale: Hurts a little bit Pain Location: L hip Pain Descriptors / Indicators: Discomfort Pain Intervention(s): Limited activity within patient's tolerance;Monitored during session    Home Living                      Prior Function            PT Goals (current goals can now be found in the  care plan section) Acute Rehab PT Goals Patient Stated Goal: to regain independence PT Goal Formulation: With patient Time For Goal Achievement: 08/05/17 Potential to Achieve Goals: Good Progress towards PT goals: Progressing toward goals    Frequency    BID      PT Plan Current plan remains appropriate    Co-evaluation              AM-PAC PT "6 Clicks" Daily Activity  Outcome Measure  Difficulty turning over in bed (including adjusting bedclothes, sheets and blankets)?: A Lot Difficulty moving from lying on back to sitting on the side of the bed? : A Lot Difficulty sitting down on and standing up from a chair with arms (e.g., wheelchair, bedside commode, etc,.)?: A Little Help needed moving to and from a bed to chair (including a wheelchair)?: A Little Help needed walking in hospital room?: A Little Help needed climbing 3-5 steps with a railing? : A Little 6 Click Score: 16    End of Session Equipment Utilized During Treatment: Gait belt Activity Tolerance: Patient tolerated treatment well Patient left: in chair;with call bell/phone within reach;with chair alarm set Nurse Communication: Mobility status PT Visit Diagnosis: Pain;Unsteadiness on feet (R26.81);Muscle weakness (generalized) (M62.81);Other abnormalities of gait and mobility (R26.89) Pain - Right/Left: Left Pain - part of body: Hip     Time: 5597-4163 PT Time Calculation (min) (ACUTE ONLY): 25 min  Charges:                       G CodesWetzel Bjornstad, SPT 07/23/2017, 9:31 AM

## 2017-07-23 NOTE — Anesthesia Postprocedure Evaluation (Signed)
Anesthesia Post Note  Patient: Christopher Marsh  Procedure(s) Performed: Procedure(s) (LRB): TOTAL HIP ARTHROPLASTY (Left)  Patient location during evaluation: Other Anesthesia Type: Spinal Level of consciousness: oriented and awake and alert Pain management: pain level controlled Vital Signs Assessment: post-procedure vital signs reviewed and stable Respiratory status: spontaneous breathing, respiratory function stable and patient connected to nasal cannula oxygen Cardiovascular status: blood pressure returned to baseline and stable Postop Assessment: no headache, no backache and no apparent nausea or vomiting Anesthetic complications: no     Last Vitals:  Vitals:   07/23/17 0422 07/23/17 0714  BP: (!) 159/58 (!) 157/67  Pulse: 67 70  Resp:  14  Temp: 37.1 C 36.7 C  SpO2: 97% 98%    Last Pain:  Vitals:   07/23/17 0714  TempSrc: Oral  PainSc:                  Virgilio Frees

## 2017-07-23 NOTE — Progress Notes (Signed)
Physical Therapy Treatment Patient Details Name: Christopher Marsh MRN: [id number] DOB: [birth year] Today's Date: 07/23/2017    History of Present Illness Pt isa 56 y/o M s/p L THA.  Pt's PMH includes DVT.    PT Comments    Pt able to ambulate 170ft with RW for support. Gait pattern improved to step-through pattern with less BUE support through RW. Able to go up and down 4 stairs x2; 1x with left rail, 1x with right rail; step-to pattern going up and down sideways. Pt performed standing LE exercises with supervision and RW BUE support. Pt tolerated session well with mild c/o fatigue.    Follow Up Recommendations  Outpatient PT;Supervision for mobility/OOB     Equipment Recommendations  Rolling walker with 5" wheels;3in1 (PT)    Recommendations for Other Services       Precautions / Restrictions Precautions Precautions: Fall;Posterior Hip Precaution Booklet Issued: No Precaution Comments: Pt able to recall 3/3 hip precautions with no verbal cues Restrictions Weight Bearing Restrictions: Yes LLE Weight Bearing: Weight bearing as tolerated    Mobility  Bed Mobility               General bed mobility comments: deferred, pt left in recliner end of session  Transfers Overall transfer level: Needs assistance Equipment used: Rolling walker (2 wheeled) Transfers: Sit to/from Stand Sit to Stand: Min guard         General transfer comment: Vc's for proper foot placement, to reach back for armrests, slow and controlled descent  Ambulation/Gait Ambulation/Gait assistance: Min guard Ambulation Distance (Feet): 275 Feet Assistive device: Rolling walker (2 wheeled) Gait Pattern/deviations: Step-through pattern;Decreased stride length;Trunk flexed;Antalgic;Decreased weight shift to right Gait velocity: decreased   General Gait Details: Vc's for upright posture and heel strike pattern; pt reports 40/60% WB in R/L LE's during stance phase   Stairs Stairs: Yes   Min guard  assist Stair Management: One rail Right;Step to pattern;Sideways;One rail Left Number of Stairs: 4 General stair comments: Up and down stairs (1x with left rail, 1x with right rail) with vc's for foot and hand placement  Wheelchair Mobility    Modified Rankin (Stroke Patients Only)       Balance Overall balance assessment: Needs assistance Sitting-balance support: Bilateral upper extremity supported Sitting balance-Leahy Scale: Good Sitting balance - Comments: Pt able to perform LAQ and ankle pump exercises in chair with BUE's supported on armrests without any loss of balance   Standing balance support: Bilateral upper extremity supported;During functional activity Standing balance-Leahy Scale: Poor Standing balance comment: Able to perform standing LE exercises with BUE support on RW                            Cognition Arousal/Alertness: Awake/alert Behavior During Therapy: [facility] for tasks assessed/performed Overall Cognitive Status: Within Functional Limits for tasks assessed                                        Exercises Total Joint Exercises Ankle Circles/Pumps: AROM;Both;Seated;20 reps Long Arc Quad: AROM;Both;Seated;15 reps (15 reps x2) Knee Flexion: AROM;Both;10 reps;Standing (supervision, RW for BUE support; 10 reps x2) Standing Hip Extension: AROM;Both;10 reps;Standing (supervision, RW for BUE support; 10 reps x2)    General Comments  Pt agreeable to PT.      Pertinent Vitals/Pain Pain Assessment: Faces Faces Pain Scale: Hurts a little  bit Pain Location: L hip Pain Descriptors / Indicators: Sore Pain Intervention(s): Monitored during session;Limited activity within patient's tolerance;Premedicated before session    Home Living                      Prior Function            PT Goals (current goals can now be found in the care plan section) Acute Rehab PT Goals Patient Stated Goal: to regain independence PT Goal  Formulation: With patient Time For Goal Achievement: 08/05/17 Potential to Achieve Goals: Good Progress towards PT goals: Progressing toward goals    Frequency    BID      PT Plan Current plan remains appropriate    Co-evaluation              AM-PAC PT "6 Clicks" Daily Activity  Outcome Measure  Difficulty turning over in bed (including adjusting bedclothes, sheets and blankets)?: A Little Difficulty moving from lying on back to sitting on the side of the bed? : A Little Difficulty sitting down on and standing up from a chair with arms (e.g., wheelchair, bedside commode, etc,.)?: A Little Help needed moving to and from a bed to chair (including a wheelchair)?: A Little Help needed walking in hospital room?: A Little Help needed climbing 3-5 steps with a railing? : A Little 6 Click Score: 18    End of Session Equipment Utilized During Treatment: Gait belt Activity Tolerance: Patient tolerated treatment well Patient left: in chair;with call bell/phone within reach;with chair alarm set;with family/visitor present (Pt's mother present) Nurse Communication: Mobility status PT Visit Diagnosis: Pain;Unsteadiness on feet (R26.81);Muscle weakness (generalized) (M62.81);Other abnormalities of gait and mobility (R26.89) Pain - Right/Left: Left Pain - part of body: Hip     Time: 1430-1453 PT Time Calculation (min) (ACUTE ONLY): 23 min  Charges:                       G CodesWetzel Bjornstad, SPT 07/23/2017, 3:45 PM

## 2017-07-23 NOTE — Progress Notes (Signed)
   Subjective: 1 Day Post-Op Procedure(s) (LRB): TOTAL HIP ARTHROPLASTY (Left) Patient reports pain as mild.   Patient is well, and has had no acute complaints or problems Continue with physical therapy Plan is to go Home after hospital stay. no nausea and no vomiting Patient denies any chest pains or shortness of breath. Objective: Vital signs in last 24 hours: Temp:  [97.1 F (36.2 C)-98.8 F (37.1 C)] 98 F (36.7 C) (09/25 0714) Pulse Rate:  [66-82] 70 (09/25 0714) Resp:  [12-18] 14 (09/25 0714) BP: (110-159)/(47-67) 157/67 (09/25 0714) SpO2:  [95 %-99 %] 98 % (09/25 0714) Heels are non tender and elevated off the bed using rolled towels Intake/Output from previous day: 09/24 0701 - 09/25 0700 In: 3498.3 [P.O.:240; I.V.:2858.3; IV Piggyback:400] Out: 2565 [Urine:1935; Drains:430; Blood:200] Intake/Output this shift: No intake/output data recorded.   Recent Labs  07/23/17 0455  HGB 12.9*    Recent Labs  07/23/17 0455  WBC 7.4  RBC 4.05*  HCT 37.0*  PLT 184    Recent Labs  07/23/17 0455  NA 135  K 3.5  CL 104  CO2 26  BUN 7  CREATININE 0.69  GLUCOSE 131*  CALCIUM 7.9*   No results for input(s): LABPT, INR in the last 72 hours.  EXAM General - Patient is Alert, Appropriate and Oriented Extremity - Neurologically intact Neurovascular intact Sensation intact distally Intact pulses distally Dorsiflexion/Plantar flexion intact Compartment soft Dressing - dressing C/D/I Motor Function - intact, moving foot and toes well on exam.    Past Medical History:  Diagnosis Date  . Arthritis   . Diabetes mellitus without complication ([place])   . DVT (deep venous thrombosis) ([facility])    Lt leg  . Dyspnea   . Elevated lipids   . Hypertension   . Lower extremity edema     Assessment/Plan: 1 Day Post-Op Procedure(s) (LRB): TOTAL HIP ARTHROPLASTY (Left) Active Problems:   Status post total replacement of hip  Estimated body mass index is 37.62 kg/m as  calculated from the following:   Height as of this encounter: 6\' 3"  (1.905 m).   Weight as of this encounter: 136.5 kg (301 lb). Advance diet Up with therapy D/C IV fluids Plan for discharge tomorrow Discharge home with home health  Labs: reviewed DVT Prophylaxis - Lovenox, Foot Pumps and TED hose Weight-Bearing as tolerated to right leg D/C O2 and Pulse OX and try on Room Air  Begin working on a bowel movement   Doralyn Kirkes R. [place] 07/23/2017, 7:39 AM

## 2017-07-24 LAB — BASIC METABOLIC PANEL
ANION GAP: 5 (ref 5–15)
BUN: 6 mg/dL (ref 6–20)
CALCIUM: 7.9 mg/dL — AB (ref 8.9–10.3)
CO2: 27 mmol/L (ref 22–32)
Chloride: 104 mmol/L (ref 101–111)
Creatinine, Ser: 0.64 mg/dL (ref 0.61–1.24)
GFR calc non Af Amer: >60 mL/min (ref >60)
Glucose, Bld: 136 mg/dL — ABNORMAL HIGH (ref 65–99)
Potassium: 3.3 mmol/L — ABNORMAL LOW (ref 3.5–5.1)
SODIUM: 136 mmol/L (ref 135–145)

## 2017-07-24 LAB — CBC
HCT: 34.9 % — ABNORMAL LOW (ref 40.0–52.0)
HEMOGLOBIN: 12.6 g/dL — AB (ref 13.0–18.0)
MCH: 33.1 pg (ref 26.0–34.0)
MCHC: 36 g/dL (ref 32.0–36.0)
MCV: 91.8 fL (ref 80.0–100.0)
Platelets: 162 10*3/uL (ref 150–440)
RBC: 3.8 MIL/uL — AB (ref 4.40–5.90)
RDW: 13.6 % (ref 11.5–14.5)
WBC: 6.6 10*3/uL (ref 3.8–10.6)

## 2017-07-24 LAB — GLUCOSE, CAPILLARY
GLUCOSE-CAPILLARY: 125 mg/dL — AB (ref 65–99)
Glucose-Capillary: 160 mg/dL — ABNORMAL HIGH (ref 65–99)

## 2017-07-24 LAB — SURGICAL PATHOLOGY

## 2017-07-24 NOTE — Progress Notes (Signed)
Discharge Instructions reviewed with patient and patient's spouse with permission. Dressing to surgical site changed per discharge order and 2 additional dressing sent with patient home. Patient given prescription for Tramadol and Oxycodone. Patient reminded of sedative drug warning and verbalized understanding. IV x 2 dc'ed with cath intact. Patient reminded to take all belongings. Patient escorted out via wheelchair- Patient's spouse to drive him home.

## 2017-07-24 NOTE — Progress Notes (Signed)
OT Cancellation Note  Patient Details Name: Christopher Marsh MRN: [id number] DOB: [birth year]   Cancelled Treatment:    Reason Eval/Treat Not Completed: Patient declined, no reason specified. Pt sitting up in recliner upon attempt to treat. Pt verbalizing no additional OT needs at this time, politely declines OT treatment, feels ready to return home. Pt planning to discharge home today. Will sign off.   Jeni Salles, MPH, MS, OTR/L ascom [phone] 07/24/17, 9:34 AM

## 2017-07-24 NOTE — Progress Notes (Signed)
   Subjective: 2 Days Post-Op Procedure(s) (LRB): TOTAL HIP ARTHROPLASTY (Left) Patient reports pain as 3 on 0-10 scale.   Patient is well, and has had no acute complaints or problems Continue with physical therapy this morning. Patient a great yesterday with therapy. Recommending home therapy as to not stress the hip getting and out of a car. Plan is to go Home after hospital stay. no nausea and no vomiting Patient denies any chest pains or shortness of breath. Objective: Vital signs in last 24 hours: Temp:  [98 F (36.7 C)] 98 F (36.7 C) (09/25 0714) Pulse Rate:  [70] 70 (09/25 0714) Resp:  [14] 14 (09/25 0714) BP: (157)/(67) 157/67 (09/25 0714) SpO2:  [98 %] 98 % (09/25 0714) well approximated incision Heels are non tender and elevated off the bed using rolled towels Intake/Output from previous day: 09/25 0701 - 09/26 0700 In: 1426.7 [P.O.:360; I.V.:1066.7] Out: 1720 [Urine:1410; Drains:310] Intake/Output this shift: Total I/O In: 0  Out: 1440 [Urine:1410; Drains:30]   Recent Labs  07/23/17 0455 07/24/17 0439  HGB 12.9* 12.6*    Recent Labs  07/23/17 0455 07/24/17 0439  WBC 7.4 6.6  RBC 4.05* 3.80*  HCT 37.0* 34.9*  PLT 184 162    Recent Labs  07/23/17 0455 07/24/17 0439  NA 135 136  K 3.5 3.3*  CL 104 104  CO2 26 27  BUN 7 6  CREATININE 0.69 0.64  GLUCOSE 131* 136*  CALCIUM 7.9* 7.9*   No results for input(s): LABPT, INR in the last 72 hours.  EXAM General - Patient is Alert, Appropriate and Oriented Extremity - Neurologically intact Neurovascular intact Sensation intact distally Intact pulses distally Dorsiflexion/Plantar flexion intact No cellulitis present Compartment soft Dressing - moderate drainage Motor Function - intact, moving foot and toes well on exam.    Past Medical History:  Diagnosis Date  . Arthritis   . Diabetes mellitus without complication ([place])   . DVT (deep venous thrombosis) ([facility])    Lt leg  . Dyspnea   .  Elevated lipids   . Hypertension   . Lower extremity edema     Assessment/Plan: 2 Days Post-Op Procedure(s) (LRB): TOTAL HIP ARTHROPLASTY (Left) Active Problems:   Status post total replacement of hip  Estimated body mass index is 37.62 kg/m as calculated from the following:   Height as of this encounter: 6\' 3"  (1.905 m).   Weight as of this encounter: 136.5 kg (301 lb). Up with therapy Discharge home with home health  Labs: Were reviewed DVT Prophylaxis - Lovenox, Foot Pumps and TED hose Weight-Bearing as tolerated to left leg Hemovac discontinued on today's visit Please change dressing prior to patient being discharged and give the patient 2 extra honeycomb dressings to take home.  Jillyn Ledger. [place] 07/24/2017, 6:59 AM

## 2017-07-24 NOTE — Care Management (Signed)
RNCM has notified Tim with [facility] of patient discharge. No other RNCM needs.

## 2017-07-24 NOTE — Progress Notes (Signed)
Physical Therapy Treatment Patient Details Name: Christopher Marsh MRN: [id number] DOB: 10-Sep-1961 Today's Date: 07/24/2017    History of Present Illness Pt isa 56 y/o M s/p L THA.  Pt's PMH includes DVT.    PT Comments    Mr. Stech continues to make good progress with mobility and therapeutic exercise.  Supervision provided for safety with transfers and ambulation with improved gait mechanics following cues.  Pt will benefit from continued skilled PT services to increase functional independence and safety.    Follow Up Recommendations  Outpatient PT;Supervision for mobility/OOB     Equipment Recommendations  Rolling walker with 5" wheels;3in1 (PT)    Recommendations for Other Services       Precautions / Restrictions Precautions Precautions: Fall;Posterior Hip Precaution Comments: Pt able to recall 3/3 hip precautions without cues Restrictions Weight Bearing Restrictions: Yes LLE Weight Bearing: Weight bearing as tolerated    Mobility  Bed Mobility               General bed mobility comments: Pt sitting in chair at start and end of session  Transfers Overall transfer level: Needs assistance Equipment used: Rolling walker (2 wheeled) Transfers: Sit to/from Stand Sit to Stand: Supervision         General transfer comment: Supervision for safety.  Pt requires cue to bring L foot forward when preparing to sit.   Ambulation/Gait Ambulation/Gait assistance: Supervision Ambulation Distance (Feet): 200 Feet Assistive device: Rolling walker (2 wheeled) Gait Pattern/deviations: Step-to pattern;Decreased stride length;Decreased weight shift to left;Decreased stance time - left;Decreased step length - right;Antalgic;Trunk flexed Gait velocity: decreased Gait velocity interpretation: Below normal speed for age/gender General Gait Details: Cues for upright posture and to relax shoulders for decreased WBing through RW.  Supervision for safety.    Stairs             Wheelchair Mobility    Modified Rankin (Stroke Patients Only)       Balance Overall balance assessment: Needs assistance Sitting-balance support: Feet supported;No upper extremity supported Sitting balance-Leahy Scale: Good     Standing balance support: No upper extremity supported;During functional activity Standing balance-Leahy Scale: Fair Standing balance comment: Pt able to stand statically without UE support but requires UE support for dynamic activities.                             Cognition Arousal/Alertness: Awake/alert Behavior During Therapy: WFL for tasks assessed/performed Overall Cognitive Status: Within Functional Limits for tasks assessed                                        Exercises Total Joint Exercises Long Arc Quad: AROM;Left;10 reps;Seated Knee Flexion: AROM;Left;10 reps;Standing Standing Hip Extension: AROM;Left;10 reps;Standing (cues to avoid compensatory trunk flexion) Other Exercises Other Exercises: Bil heel raises x10 with UEs supported.     General Comments        Pertinent Vitals/Pain Pain Assessment: Faces Faces Pain Scale: Hurts a little bit Pain Location: L hip Pain Descriptors / Indicators: Discomfort Pain Intervention(s): Limited activity within patient's tolerance;Monitored during session    Home Living                      Prior Function            PT Goals (current goals can now be found in the  care plan section) Acute Rehab PT Goals Patient Stated Goal: to regain independence PT Goal Formulation: With patient Time For Goal Achievement: 08/05/17 Potential to Achieve Goals: Good Progress towards PT goals: Progressing toward goals    Frequency    BID      PT Plan Current plan remains appropriate    Co-evaluation              AM-PAC PT "6 Clicks" Daily Activity  Outcome Measure  Difficulty turning over in bed (including adjusting bedclothes, sheets and  blankets)?: A Little Difficulty moving from lying on back to sitting on the side of the bed? : A Little Difficulty sitting down on and standing up from a chair with arms (e.g., wheelchair, bedside commode, etc,.)?: A Little Help needed moving to and from a bed to chair (including a wheelchair)?: A Little Help needed walking in hospital room?: A Little Help needed climbing 3-5 steps with a railing? : A Little 6 Click Score: 18    End of Session Equipment Utilized During Treatment: Gait belt Activity Tolerance: Patient tolerated treatment well;Patient limited by pain Patient left: in chair;with call bell/phone within reach;with chair alarm set Nurse Communication: Mobility status PT Visit Diagnosis: Pain;Unsteadiness on feet (R26.81);Muscle weakness (generalized) (M62.81);Other abnormalities of gait and mobility (R26.89) Pain - Right/Left: Left Pain - part of body: Hip     Time: 4709-6283 PT Time Calculation (min) (ACUTE ONLY): 15 min  Charges:  $Gait Training: 8-22 mins                    G Codes:       Collie Siad PT, DPT 07/24/2017, 12:45 PM

## 2017-07-26 DIAGNOSIS — Z96642 Presence of left artificial hip joint: Secondary | ICD-10-CM | POA: Diagnosis not present

## 2017-07-26 DIAGNOSIS — E119 Type 2 diabetes mellitus without complications: Secondary | ICD-10-CM | POA: Diagnosis not present

## 2017-07-26 DIAGNOSIS — I872 Venous insufficiency (chronic) (peripheral): Secondary | ICD-10-CM | POA: Diagnosis not present

## 2017-07-26 DIAGNOSIS — Z79891 Long term (current) use of opiate analgesic: Secondary | ICD-10-CM | POA: Diagnosis not present

## 2017-07-26 DIAGNOSIS — Z7984 Long term (current) use of oral hypoglycemic drugs: Secondary | ICD-10-CM | POA: Diagnosis not present

## 2017-07-26 DIAGNOSIS — Z9181 History of falling: Secondary | ICD-10-CM | POA: Diagnosis not present

## 2017-07-26 DIAGNOSIS — E7800 Pure hypercholesterolemia, unspecified: Secondary | ICD-10-CM | POA: Diagnosis not present

## 2017-07-26 DIAGNOSIS — I10 Essential (primary) hypertension: Secondary | ICD-10-CM | POA: Diagnosis not present

## 2017-07-26 DIAGNOSIS — Z86718 Personal history of other venous thrombosis and embolism: Secondary | ICD-10-CM | POA: Diagnosis not present

## 2017-07-26 DIAGNOSIS — Z7901 Long term (current) use of anticoagulants: Secondary | ICD-10-CM | POA: Diagnosis not present

## 2017-07-26 DIAGNOSIS — Z471 Aftercare following joint replacement surgery: Secondary | ICD-10-CM | POA: Diagnosis not present

## 2017-07-26 DIAGNOSIS — N529 Male erectile dysfunction, unspecified: Secondary | ICD-10-CM | POA: Diagnosis not present

## 2017-07-29 DIAGNOSIS — N529 Male erectile dysfunction, unspecified: Secondary | ICD-10-CM | POA: Diagnosis not present

## 2017-07-29 DIAGNOSIS — Z86718 Personal history of other venous thrombosis and embolism: Secondary | ICD-10-CM | POA: Diagnosis not present

## 2017-07-29 DIAGNOSIS — E7800 Pure hypercholesterolemia, unspecified: Secondary | ICD-10-CM | POA: Diagnosis not present

## 2017-07-29 DIAGNOSIS — Z9181 History of falling: Secondary | ICD-10-CM | POA: Diagnosis not present

## 2017-07-29 DIAGNOSIS — Z79891 Long term (current) use of opiate analgesic: Secondary | ICD-10-CM | POA: Diagnosis not present

## 2017-07-29 DIAGNOSIS — Z7984 Long term (current) use of oral hypoglycemic drugs: Secondary | ICD-10-CM | POA: Diagnosis not present

## 2017-07-29 DIAGNOSIS — Z96642 Presence of left artificial hip joint: Secondary | ICD-10-CM | POA: Diagnosis not present

## 2017-07-29 DIAGNOSIS — I872 Venous insufficiency (chronic) (peripheral): Secondary | ICD-10-CM | POA: Diagnosis not present

## 2017-07-29 DIAGNOSIS — I10 Essential (primary) hypertension: Secondary | ICD-10-CM | POA: Diagnosis not present

## 2017-07-29 DIAGNOSIS — E119 Type 2 diabetes mellitus without complications: Secondary | ICD-10-CM | POA: Diagnosis not present

## 2017-07-29 DIAGNOSIS — Z7901 Long term (current) use of anticoagulants: Secondary | ICD-10-CM | POA: Diagnosis not present

## 2017-07-29 DIAGNOSIS — Z471 Aftercare following joint replacement surgery: Secondary | ICD-10-CM | POA: Diagnosis not present

## 2017-07-31 DIAGNOSIS — Z96642 Presence of left artificial hip joint: Secondary | ICD-10-CM | POA: Diagnosis not present

## 2017-07-31 DIAGNOSIS — Z7901 Long term (current) use of anticoagulants: Secondary | ICD-10-CM | POA: Diagnosis not present

## 2017-07-31 DIAGNOSIS — N529 Male erectile dysfunction, unspecified: Secondary | ICD-10-CM | POA: Diagnosis not present

## 2017-07-31 DIAGNOSIS — I872 Venous insufficiency (chronic) (peripheral): Secondary | ICD-10-CM | POA: Diagnosis not present

## 2017-07-31 DIAGNOSIS — Z9181 History of falling: Secondary | ICD-10-CM | POA: Diagnosis not present

## 2017-07-31 DIAGNOSIS — E119 Type 2 diabetes mellitus without complications: Secondary | ICD-10-CM | POA: Diagnosis not present

## 2017-07-31 DIAGNOSIS — Z471 Aftercare following joint replacement surgery: Secondary | ICD-10-CM | POA: Diagnosis not present

## 2017-07-31 DIAGNOSIS — Z79891 Long term (current) use of opiate analgesic: Secondary | ICD-10-CM | POA: Diagnosis not present

## 2017-07-31 DIAGNOSIS — I10 Essential (primary) hypertension: Secondary | ICD-10-CM | POA: Diagnosis not present

## 2017-07-31 DIAGNOSIS — Z86718 Personal history of other venous thrombosis and embolism: Secondary | ICD-10-CM | POA: Diagnosis not present

## 2017-07-31 DIAGNOSIS — E7800 Pure hypercholesterolemia, unspecified: Secondary | ICD-10-CM | POA: Diagnosis not present

## 2017-07-31 DIAGNOSIS — Z7984 Long term (current) use of oral hypoglycemic drugs: Secondary | ICD-10-CM | POA: Diagnosis not present

## 2017-08-02 DIAGNOSIS — Z471 Aftercare following joint replacement surgery: Secondary | ICD-10-CM | POA: Diagnosis not present

## 2017-08-02 DIAGNOSIS — E7800 Pure hypercholesterolemia, unspecified: Secondary | ICD-10-CM | POA: Diagnosis not present

## 2017-08-02 DIAGNOSIS — E119 Type 2 diabetes mellitus without complications: Secondary | ICD-10-CM | POA: Diagnosis not present

## 2017-08-02 DIAGNOSIS — Z7901 Long term (current) use of anticoagulants: Secondary | ICD-10-CM | POA: Diagnosis not present

## 2017-08-02 DIAGNOSIS — Z79891 Long term (current) use of opiate analgesic: Secondary | ICD-10-CM | POA: Diagnosis not present

## 2017-08-02 DIAGNOSIS — Z9181 History of falling: Secondary | ICD-10-CM | POA: Diagnosis not present

## 2017-08-02 DIAGNOSIS — Z86718 Personal history of other venous thrombosis and embolism: Secondary | ICD-10-CM | POA: Diagnosis not present

## 2017-08-02 DIAGNOSIS — I10 Essential (primary) hypertension: Secondary | ICD-10-CM | POA: Diagnosis not present

## 2017-08-02 DIAGNOSIS — I872 Venous insufficiency (chronic) (peripheral): Secondary | ICD-10-CM | POA: Diagnosis not present

## 2017-08-02 DIAGNOSIS — Z7984 Long term (current) use of oral hypoglycemic drugs: Secondary | ICD-10-CM | POA: Diagnosis not present

## 2017-08-02 DIAGNOSIS — N529 Male erectile dysfunction, unspecified: Secondary | ICD-10-CM | POA: Diagnosis not present

## 2017-08-02 DIAGNOSIS — Z96642 Presence of left artificial hip joint: Secondary | ICD-10-CM | POA: Diagnosis not present

## 2017-08-03 ENCOUNTER — Encounter: Payer: Self-pay | Admitting: Family Medicine

## 2017-08-05 ENCOUNTER — Ambulatory Visit: Payer: BLUE CROSS/BLUE SHIELD | Admitting: Family Medicine

## 2017-08-05 DIAGNOSIS — I10 Essential (primary) hypertension: Secondary | ICD-10-CM | POA: Diagnosis not present

## 2017-08-05 DIAGNOSIS — Z7984 Long term (current) use of oral hypoglycemic drugs: Secondary | ICD-10-CM | POA: Diagnosis not present

## 2017-08-05 DIAGNOSIS — E7800 Pure hypercholesterolemia, unspecified: Secondary | ICD-10-CM | POA: Diagnosis not present

## 2017-08-05 DIAGNOSIS — Z7901 Long term (current) use of anticoagulants: Secondary | ICD-10-CM | POA: Diagnosis not present

## 2017-08-05 DIAGNOSIS — Z79891 Long term (current) use of opiate analgesic: Secondary | ICD-10-CM | POA: Diagnosis not present

## 2017-08-05 DIAGNOSIS — I872 Venous insufficiency (chronic) (peripheral): Secondary | ICD-10-CM | POA: Diagnosis not present

## 2017-08-05 DIAGNOSIS — Z9181 History of falling: Secondary | ICD-10-CM | POA: Diagnosis not present

## 2017-08-05 DIAGNOSIS — Z471 Aftercare following joint replacement surgery: Secondary | ICD-10-CM | POA: Diagnosis not present

## 2017-08-05 DIAGNOSIS — E119 Type 2 diabetes mellitus without complications: Secondary | ICD-10-CM | POA: Diagnosis not present

## 2017-08-05 DIAGNOSIS — Z86718 Personal history of other venous thrombosis and embolism: Secondary | ICD-10-CM | POA: Diagnosis not present

## 2017-08-05 DIAGNOSIS — N529 Male erectile dysfunction, unspecified: Secondary | ICD-10-CM | POA: Diagnosis not present

## 2017-08-05 DIAGNOSIS — Z96642 Presence of left artificial hip joint: Secondary | ICD-10-CM | POA: Diagnosis not present

## 2017-08-07 DIAGNOSIS — I872 Venous insufficiency (chronic) (peripheral): Secondary | ICD-10-CM | POA: Diagnosis not present

## 2017-08-07 DIAGNOSIS — E119 Type 2 diabetes mellitus without complications: Secondary | ICD-10-CM | POA: Diagnosis not present

## 2017-08-07 DIAGNOSIS — Z9181 History of falling: Secondary | ICD-10-CM | POA: Diagnosis not present

## 2017-08-07 DIAGNOSIS — Z7984 Long term (current) use of oral hypoglycemic drugs: Secondary | ICD-10-CM | POA: Diagnosis not present

## 2017-08-07 DIAGNOSIS — Z86718 Personal history of other venous thrombosis and embolism: Secondary | ICD-10-CM | POA: Diagnosis not present

## 2017-08-07 DIAGNOSIS — N529 Male erectile dysfunction, unspecified: Secondary | ICD-10-CM | POA: Diagnosis not present

## 2017-08-07 DIAGNOSIS — Z79891 Long term (current) use of opiate analgesic: Secondary | ICD-10-CM | POA: Diagnosis not present

## 2017-08-07 DIAGNOSIS — E7800 Pure hypercholesterolemia, unspecified: Secondary | ICD-10-CM | POA: Diagnosis not present

## 2017-08-07 DIAGNOSIS — Z7901 Long term (current) use of anticoagulants: Secondary | ICD-10-CM | POA: Diagnosis not present

## 2017-08-07 DIAGNOSIS — Z96642 Presence of left artificial hip joint: Secondary | ICD-10-CM | POA: Diagnosis not present

## 2017-08-07 DIAGNOSIS — Z471 Aftercare following joint replacement surgery: Secondary | ICD-10-CM | POA: Diagnosis not present

## 2017-08-07 DIAGNOSIS — I10 Essential (primary) hypertension: Secondary | ICD-10-CM | POA: Diagnosis not present

## 2017-08-09 DIAGNOSIS — Z86718 Personal history of other venous thrombosis and embolism: Secondary | ICD-10-CM | POA: Diagnosis not present

## 2017-08-09 DIAGNOSIS — I10 Essential (primary) hypertension: Secondary | ICD-10-CM | POA: Diagnosis not present

## 2017-08-09 DIAGNOSIS — E7800 Pure hypercholesterolemia, unspecified: Secondary | ICD-10-CM | POA: Diagnosis not present

## 2017-08-09 DIAGNOSIS — Z7984 Long term (current) use of oral hypoglycemic drugs: Secondary | ICD-10-CM | POA: Diagnosis not present

## 2017-08-09 DIAGNOSIS — I872 Venous insufficiency (chronic) (peripheral): Secondary | ICD-10-CM | POA: Diagnosis not present

## 2017-08-09 DIAGNOSIS — Z471 Aftercare following joint replacement surgery: Secondary | ICD-10-CM | POA: Diagnosis not present

## 2017-08-09 DIAGNOSIS — E119 Type 2 diabetes mellitus without complications: Secondary | ICD-10-CM | POA: Diagnosis not present

## 2017-08-09 DIAGNOSIS — Z9181 History of falling: Secondary | ICD-10-CM | POA: Diagnosis not present

## 2017-08-09 DIAGNOSIS — Z7901 Long term (current) use of anticoagulants: Secondary | ICD-10-CM | POA: Diagnosis not present

## 2017-08-09 DIAGNOSIS — N529 Male erectile dysfunction, unspecified: Secondary | ICD-10-CM | POA: Diagnosis not present

## 2017-08-09 DIAGNOSIS — Z96642 Presence of left artificial hip joint: Secondary | ICD-10-CM | POA: Diagnosis not present

## 2017-08-09 DIAGNOSIS — Z79891 Long term (current) use of opiate analgesic: Secondary | ICD-10-CM | POA: Diagnosis not present

## 2017-08-12 DIAGNOSIS — Z79891 Long term (current) use of opiate analgesic: Secondary | ICD-10-CM | POA: Diagnosis not present

## 2017-08-12 DIAGNOSIS — E119 Type 2 diabetes mellitus without complications: Secondary | ICD-10-CM | POA: Diagnosis not present

## 2017-08-12 DIAGNOSIS — Z9181 History of falling: Secondary | ICD-10-CM | POA: Diagnosis not present

## 2017-08-12 DIAGNOSIS — Z471 Aftercare following joint replacement surgery: Secondary | ICD-10-CM | POA: Diagnosis not present

## 2017-08-12 DIAGNOSIS — Z7984 Long term (current) use of oral hypoglycemic drugs: Secondary | ICD-10-CM | POA: Diagnosis not present

## 2017-08-12 DIAGNOSIS — I872 Venous insufficiency (chronic) (peripheral): Secondary | ICD-10-CM | POA: Diagnosis not present

## 2017-08-12 DIAGNOSIS — Z86718 Personal history of other venous thrombosis and embolism: Secondary | ICD-10-CM | POA: Diagnosis not present

## 2017-08-12 DIAGNOSIS — I10 Essential (primary) hypertension: Secondary | ICD-10-CM | POA: Diagnosis not present

## 2017-08-12 DIAGNOSIS — Z7901 Long term (current) use of anticoagulants: Secondary | ICD-10-CM | POA: Diagnosis not present

## 2017-08-12 DIAGNOSIS — Z96642 Presence of left artificial hip joint: Secondary | ICD-10-CM | POA: Diagnosis not present

## 2017-08-12 DIAGNOSIS — N529 Male erectile dysfunction, unspecified: Secondary | ICD-10-CM | POA: Diagnosis not present

## 2017-08-12 DIAGNOSIS — E7800 Pure hypercholesterolemia, unspecified: Secondary | ICD-10-CM | POA: Diagnosis not present

## 2017-08-14 DIAGNOSIS — N529 Male erectile dysfunction, unspecified: Secondary | ICD-10-CM | POA: Diagnosis not present

## 2017-08-14 DIAGNOSIS — Z7984 Long term (current) use of oral hypoglycemic drugs: Secondary | ICD-10-CM | POA: Diagnosis not present

## 2017-08-14 DIAGNOSIS — Z9181 History of falling: Secondary | ICD-10-CM | POA: Diagnosis not present

## 2017-08-14 DIAGNOSIS — E7800 Pure hypercholesterolemia, unspecified: Secondary | ICD-10-CM | POA: Diagnosis not present

## 2017-08-14 DIAGNOSIS — Z471 Aftercare following joint replacement surgery: Secondary | ICD-10-CM | POA: Diagnosis not present

## 2017-08-14 DIAGNOSIS — I10 Essential (primary) hypertension: Secondary | ICD-10-CM | POA: Diagnosis not present

## 2017-08-14 DIAGNOSIS — I872 Venous insufficiency (chronic) (peripheral): Secondary | ICD-10-CM | POA: Diagnosis not present

## 2017-08-14 DIAGNOSIS — Z7901 Long term (current) use of anticoagulants: Secondary | ICD-10-CM | POA: Diagnosis not present

## 2017-08-14 DIAGNOSIS — Z79891 Long term (current) use of opiate analgesic: Secondary | ICD-10-CM | POA: Diagnosis not present

## 2017-08-14 DIAGNOSIS — Z96642 Presence of left artificial hip joint: Secondary | ICD-10-CM | POA: Diagnosis not present

## 2017-08-14 DIAGNOSIS — Z86718 Personal history of other venous thrombosis and embolism: Secondary | ICD-10-CM | POA: Diagnosis not present

## 2017-08-14 DIAGNOSIS — E119 Type 2 diabetes mellitus without complications: Secondary | ICD-10-CM | POA: Diagnosis not present

## 2017-08-15 DIAGNOSIS — I10 Essential (primary) hypertension: Secondary | ICD-10-CM | POA: Diagnosis not present

## 2017-08-15 DIAGNOSIS — Z471 Aftercare following joint replacement surgery: Secondary | ICD-10-CM | POA: Diagnosis not present

## 2017-08-15 DIAGNOSIS — Z86718 Personal history of other venous thrombosis and embolism: Secondary | ICD-10-CM | POA: Diagnosis not present

## 2017-08-15 DIAGNOSIS — E7800 Pure hypercholesterolemia, unspecified: Secondary | ICD-10-CM | POA: Diagnosis not present

## 2017-08-15 DIAGNOSIS — E119 Type 2 diabetes mellitus without complications: Secondary | ICD-10-CM | POA: Diagnosis not present

## 2017-08-15 DIAGNOSIS — Z79891 Long term (current) use of opiate analgesic: Secondary | ICD-10-CM | POA: Diagnosis not present

## 2017-08-15 DIAGNOSIS — N529 Male erectile dysfunction, unspecified: Secondary | ICD-10-CM | POA: Diagnosis not present

## 2017-08-15 DIAGNOSIS — Z7901 Long term (current) use of anticoagulants: Secondary | ICD-10-CM | POA: Diagnosis not present

## 2017-08-15 DIAGNOSIS — Z7984 Long term (current) use of oral hypoglycemic drugs: Secondary | ICD-10-CM | POA: Diagnosis not present

## 2017-08-15 DIAGNOSIS — Z9181 History of falling: Secondary | ICD-10-CM | POA: Diagnosis not present

## 2017-08-15 DIAGNOSIS — Z96642 Presence of left artificial hip joint: Secondary | ICD-10-CM | POA: Diagnosis not present

## 2017-08-15 DIAGNOSIS — I872 Venous insufficiency (chronic) (peripheral): Secondary | ICD-10-CM | POA: Diagnosis not present

## 2017-08-20 DIAGNOSIS — Z471 Aftercare following joint replacement surgery: Secondary | ICD-10-CM | POA: Diagnosis not present

## 2017-08-26 ENCOUNTER — Ambulatory Visit (INDEPENDENT_AMBULATORY_CARE_PROVIDER_SITE_OTHER): Payer: BLUE CROSS/BLUE SHIELD | Admitting: Family Medicine

## 2017-08-26 ENCOUNTER — Encounter: Payer: Self-pay | Admitting: Family Medicine

## 2017-08-26 VITALS — BP 124/68 | HR 76 | Temp 98.6°F | Wt 297.0 lb

## 2017-08-26 DIAGNOSIS — I10 Essential (primary) hypertension: Secondary | ICD-10-CM

## 2017-08-26 NOTE — Patient Instructions (Signed)
Continue current medications  Check your blood pressure weekly since its normal  Return for your annual physical examination when you are due.

## 2017-08-26 NOTE — Progress Notes (Signed)
Christopher Marsh is a 56 year old married male nonsmoker who comes in today for follow-up of hyper-tension  We been decreasing his medication because he developed hypotension and syncope. We stopped the diureticand ACE inhibitor. He's currently taking Norvasc 5 mg.  5 weeks status post left hip replacement doing well  BP 124/68 (BP Location: Left Arm, Patient Position: Sitting, Cuff Size: Normal)   Pulse 76   Temp 98.6 F (37 C) (Oral)   Wt 297 lb (134.7 kg)   BMI 37.12 kg/m  Jellies well-developed well-nourished male no acute distress BP right arm sitting position by me 135/85  Hypertension at goal............ Continue current treatment program BP check weekly follow-up CPX when do

## 2017-09-03 DIAGNOSIS — Z96642 Presence of left artificial hip joint: Secondary | ICD-10-CM | POA: Diagnosis not present

## 2017-09-13 ENCOUNTER — Other Ambulatory Visit: Payer: Self-pay | Admitting: Family Medicine

## 2017-09-28 ENCOUNTER — Other Ambulatory Visit: Payer: Self-pay | Admitting: Family Medicine

## 2017-10-08 ENCOUNTER — Encounter: Payer: Self-pay | Admitting: Family Medicine

## 2017-10-15 ENCOUNTER — Encounter: Payer: BLUE CROSS/BLUE SHIELD | Admitting: Family Medicine

## 2017-10-17 ENCOUNTER — Other Ambulatory Visit: Payer: Self-pay | Admitting: Family Medicine

## 2017-11-18 ENCOUNTER — Ambulatory Visit (INDEPENDENT_AMBULATORY_CARE_PROVIDER_SITE_OTHER): Payer: BLUE CROSS/BLUE SHIELD | Admitting: Family Medicine

## 2017-11-18 ENCOUNTER — Encounter: Payer: Self-pay | Admitting: Family Medicine

## 2017-11-18 VITALS — BP 130/80 | HR 72 | Temp 97.8°F | Ht 75.0 in | Wt 310.0 lb

## 2017-11-18 DIAGNOSIS — I10 Essential (primary) hypertension: Secondary | ICD-10-CM | POA: Diagnosis not present

## 2017-11-18 DIAGNOSIS — E11649 Type 2 diabetes mellitus with hypoglycemia without coma: Secondary | ICD-10-CM

## 2017-11-18 DIAGNOSIS — Z0000 Encounter for general adult medical examination without abnormal findings: Secondary | ICD-10-CM

## 2017-11-18 DIAGNOSIS — Z125 Encounter for screening for malignant neoplasm of prostate: Secondary | ICD-10-CM | POA: Diagnosis not present

## 2017-11-18 DIAGNOSIS — F528 Other sexual dysfunction not due to a substance or known physiological condition: Secondary | ICD-10-CM

## 2017-11-18 DIAGNOSIS — E785 Hyperlipidemia, unspecified: Secondary | ICD-10-CM

## 2017-11-18 DIAGNOSIS — B009 Herpesviral infection, unspecified: Secondary | ICD-10-CM

## 2017-11-18 LAB — POCT URINALYSIS DIPSTICK
Ketones, UA: NEGATIVE
LEUKOCYTES UA: NEGATIVE
Nitrite, UA: NEGATIVE
RBC UA: NEGATIVE
Spec Grav, UA: >=1.03 — AB (ref 1.010–1.025)
Urobilinogen, UA: 4 E.U./dL — AB
pH, UA: 6 (ref 5.0–8.0)

## 2017-11-18 MED ORDER — TRIAMCINOLONE ACETONIDE 0.1 % EX OINT: TOPICAL_OINTMENT | Freq: Two times a day (BID) | CUTANEOUS | 1 refills | Status: DC

## 2017-11-18 MED ORDER — ACYCLOVIR 400 MG PO TABS: 400.0000 mg | ORAL_TABLET | Freq: Three times a day (TID) | ORAL | 1 refills | Status: DC | PRN

## 2017-11-18 MED ORDER — PRAVASTATIN SODIUM 20 MG PO TABS: 20.0000 mg | ORAL_TABLET | Freq: Every day | ORAL | 4 refills | Status: DC

## 2017-11-18 MED ORDER — METFORMIN HCL 500 MG PO TABS: ORAL_TABLET | ORAL | 4 refills | Status: DC

## 2017-11-18 MED ORDER — AMLODIPINE BESYLATE 5 MG PO TABS: 5.0000 mg | ORAL_TABLET | Freq: Every day | ORAL | 3 refills | Status: DC

## 2017-11-18 MED ORDER — TADALAFIL 20 MG PO TABS: 10.0000 mg | ORAL_TABLET | ORAL | 11 refills | Status: DC | PRN

## 2017-11-18 NOTE — Patient Instructions (Signed)
Carbohydrate free diet  Walk 30 minutes daily  Continue current medications  Labs today.............. I will call you when  you're due for follow-up  Contact the gastroenterologist in [facility] about your colonoscopy  Call your insurance company to find out where you can get the shingles vaccine  See your ophthalmologist yearly for an eye exam because of the diabetes

## 2017-11-18 NOTE — Progress Notes (Signed)
Christopher Marsh is a 57 year old married male nonsmoker who comes in today for general physical examination because of a history of diabetes, obesity, hypertension and osteoarthritis. For hypertension he takes Norvasc 5 mg daily. BP at home 130/70  He takes acyclovir 400 mg 3 times daily when necessary for outbreaks of HSV one  He takes metformin 500 mg daily because of a history of type 2 diabetes. Last A1c September 2018 was 6.5%.  He takes Pravachol 20 mg daily for hyperlipidemia  He uses Cialis when necessary for mild ED  He uses Kenalog gel for skin rashes  He had his lip if replaced in [place] 4 months ago. He's done wellness had no complications  He does not get routine eye care recommend he see an ophthalmologist. He does get regular dental care. He is also due for colonoscopy. He would like this done in [place].  14 point review of systems reviewed and otherwise negative  EKG was done because of a history of above. EKG was normal and unchanged  BP 130/80 (BP Location: Left Arm, Patient Position: Sitting, Cuff Size: Large)   Pulse 72   Temp 97.8 F (36.6 C) (Oral)   Ht 6\' 3"  (1.905 m)   Wt (!) 310 lb (140.6 kg)   BMI 38.75 kg/m  Well-developed well-nourished male no acute distress vital signs stable he is afebrile HEENT were negative neck was supple thyroid is not enlarged no carotid bruits cardiopulmonary exam normal abdominal exam normal genitalia normal circumcised male rectal normal stool guaiac-negative prostate normal extremities normal skin normal peripheral pulses normal except for edema both lower extremities worse on the left. He does have thigh-high stockings but he didn't put him on this morning. He states he can but the right one on but he can't put the left will not.  #1 hypertension at goal......... continue current therapy  #2 hyperlipidemia.......... continue current therapy check labs  #3 diabetes type 2........ continue diet exercise weight loss and  medications  #4 obesity............. again discussed diet exercise and weight loss  #5 ED...... Cialis when necessary  #6 status post left hip replacement  #7 recurrent HSV one........Marland Kitchen refill acyclovir

## 2017-11-19 LAB — CBC WITH DIFFERENTIAL/PLATELET
BASOS PCT: 0.2 % (ref 0.0–3.0)
Basophils Absolute: 0 10*3/uL (ref 0.0–0.1)
EOS ABS: 0.1 10*3/uL (ref 0.0–0.7)
EOS PCT: 1.6 % (ref 0.0–5.0)
HEMATOCRIT: 48.7 % (ref 39.0–52.0)
HEMOGLOBIN: 16.3 g/dL (ref 13.0–17.0)
LYMPHS PCT: 14.6 % (ref 12.0–46.0)
Lymphs Abs: 0.9 10*3/uL (ref 0.7–4.0)
MCHC: 33.5 g/dL (ref 30.0–36.0)
MCV: 89.8 fl (ref 78.0–100.0)
MONOS PCT: 13.3 % — AB (ref 3.0–12.0)
Monocytes Absolute: 0.8 10*3/uL (ref 0.1–1.0)
NEUTROS ABS: 4.3 10*3/uL (ref 1.4–7.7)
Neutrophils Relative %: 70.3 % (ref 43.0–77.0)
PLATELETS: 237 10*3/uL (ref 150.0–400.0)
RBC: 5.43 Mil/uL (ref 4.22–5.81)
RDW: 14.9 % (ref 11.5–15.5)
WBC: 6.1 10*3/uL (ref 4.0–10.5)

## 2017-11-19 LAB — BASIC METABOLIC PANEL
BUN: 9 mg/dL (ref 6–23)
CALCIUM: 9.3 mg/dL (ref 8.4–10.5)
CO2: 32 meq/L (ref 19–32)
Chloride: 101 mEq/L (ref 96–112)
Creatinine, Ser: 0.78 mg/dL (ref 0.40–1.50)
GFR: 108.48 mL/min (ref >60.00)
GLUCOSE: 116 mg/dL — AB (ref 70–99)
Potassium: 4.1 mEq/L (ref 3.5–5.1)
SODIUM: 139 meq/L (ref 135–145)

## 2017-11-19 LAB — HEPATIC FUNCTION PANEL
ALK PHOS: 74 U/L (ref 39–117)
ALT: 27 U/L (ref 0–53)
AST: 18 U/L (ref 0–37)
Albumin: 4.6 g/dL (ref 3.5–5.2)
BILIRUBIN DIRECT: 0.1 mg/dL (ref 0.0–0.3)
BILIRUBIN TOTAL: 0.5 mg/dL (ref 0.2–1.2)
TOTAL PROTEIN: 6.3 g/dL (ref 6.0–8.3)

## 2017-11-19 LAB — LIPID PANEL
CHOL/HDL RATIO: 3
Cholesterol: 160 mg/dL (ref 0–200)
HDL: 61.8 mg/dL (ref >39.00)
LDL Cholesterol: 86 mg/dL (ref 0–99)
NonHDL: 98.29
TRIGLYCERIDES: 59 mg/dL (ref 0.0–149.0)
VLDL: 11.8 mg/dL (ref 0.0–40.0)

## 2017-11-19 LAB — MICROALBUMIN / CREATININE URINE RATIO
Creatinine,U: 228.7 mg/dL
MICROALB/CREAT RATIO: 0.6 mg/g (ref 0.0–30.0)
Microalb, Ur: 1.5 mg/dL (ref 0.0–1.9)

## 2017-11-19 LAB — PSA: PSA: 2.34 ng/mL (ref 0.10–4.00)

## 2017-11-19 LAB — HEMOGLOBIN A1C: Hgb A1c MFr Bld: 6.9 % — ABNORMAL HIGH (ref 4.6–6.5)

## 2017-11-19 LAB — TSH: TSH: 1.65 u[IU]/mL (ref 0.35–4.50)

## 2017-12-30 ENCOUNTER — Other Ambulatory Visit: Payer: Self-pay | Admitting: Family Medicine

## 2018-01-14 DIAGNOSIS — Z96642 Presence of left artificial hip joint: Secondary | ICD-10-CM | POA: Diagnosis not present

## 2018-03-05 IMAGING — XA DG FLUORO GUIDE NDL PLC/BX
1 series · 1 of 1 positions shown · non-contrast
Comparison: none

CLINICAL DATA: Left hip pain.  Avascular necrosis.

[Series 1: ortho standard · 1 of 1 slices shown]
[im 1/1]
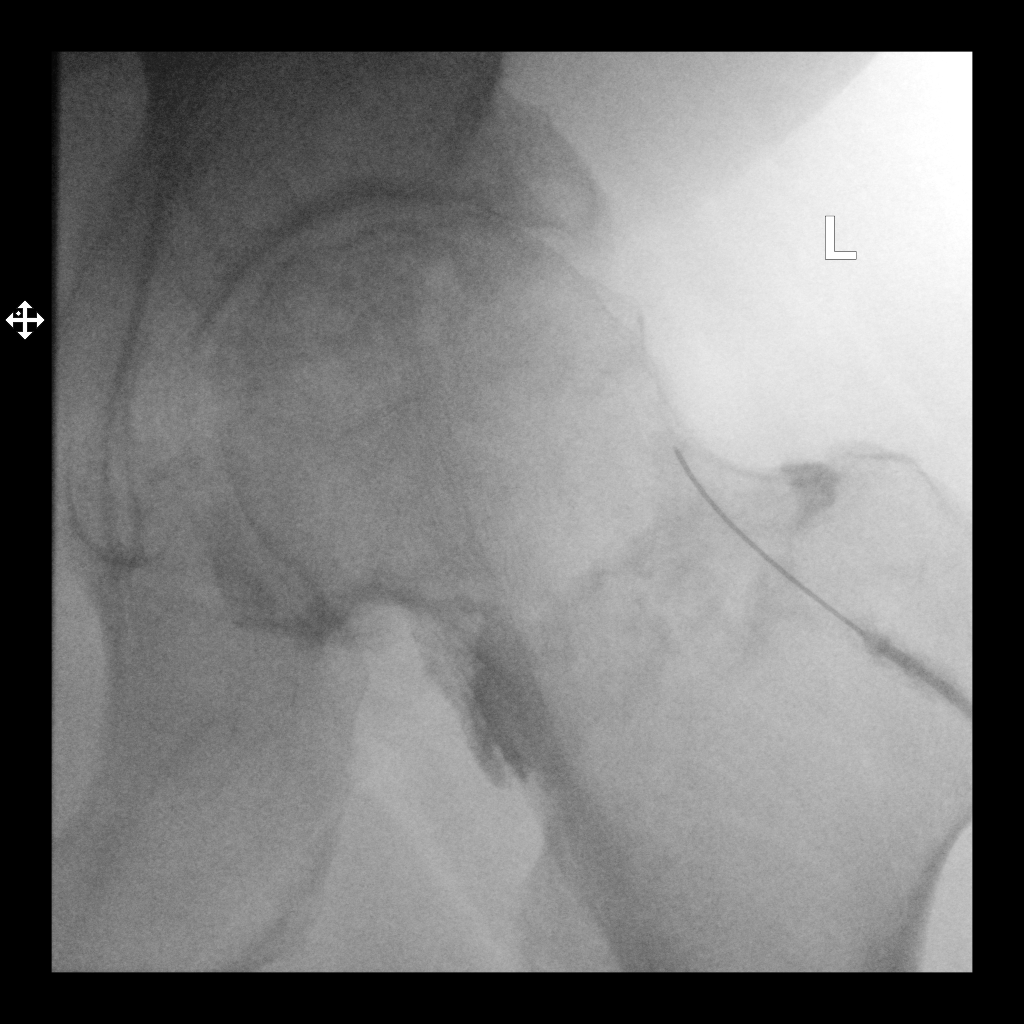

[1 of 1 positions shown; findings below may reference images not displayed]

EXAM:
LEFT HIP INJECTION UNDER FLUOROSCOPY

FLUOROSCOPY TIME:  Radiation Exposure Index (if provided by the
fluoroscopic device): 33.48 microGray*m^2

Number of Acquired Spot Images: 0

PROCEDURE:
Overlying skin prepped with Betadine, draped in the usual sterile
fashion, and infiltrated locally with 1% lidocaine. A 3.5 inch 22
gauge spinal needle was advanced to the lateral aspect of the left
femoral head-neck junction. 1 mL of 1% lidocaine injected easily.
Diagnostic injection of 2 mL of Isovue M 200 demonstrates
intra-articular spread without intravascular component. 120 mg of
Depo-Medrol and 5 mL of 0.25% bupivacaine were then administered. No
immediate complication.
IMPRESSION: Technically successful left hip injection under fluoroscopy.

## 2018-03-22 DIAGNOSIS — Z23 Encounter for immunization: Secondary | ICD-10-CM | POA: Diagnosis not present

## 2018-03-22 DIAGNOSIS — S81812A Laceration without foreign body, left lower leg, initial encounter: Secondary | ICD-10-CM | POA: Diagnosis not present

## 2018-03-28 ENCOUNTER — Encounter: Payer: Self-pay | Admitting: Family Medicine

## 2018-04-02 ENCOUNTER — Encounter: Payer: Self-pay | Admitting: Family Medicine

## 2018-04-02 ENCOUNTER — Ambulatory Visit (INDEPENDENT_AMBULATORY_CARE_PROVIDER_SITE_OTHER): Payer: BLUE CROSS/BLUE SHIELD | Admitting: Family Medicine

## 2018-04-02 VITALS — BP 140/68 | HR 66 | Temp 98.2°F | Ht 75.0 in | Wt 302.8 lb

## 2018-04-02 DIAGNOSIS — S81812D Laceration without foreign body, left lower leg, subsequent encounter: Secondary | ICD-10-CM

## 2018-04-02 NOTE — Progress Notes (Signed)
   Subjective:    Patient ID: Christopher Marsh, male    DOB: 1961-04-12, 57 y.o.   MRN: [id number]  HPI Here to remove sutures from the left lower leg. On 03-22-18 he was in [place] helping to paint his father's house. While on a ladder, the ladder shifted and his leg fell between two rungs. Apparently it scraped against a sharp part of the ladder. He had a laceration so he went to an urgent care. They closed the wound with 7 sutures. Since then he has been dressing the wound daily with Bacitracin and gauze. There is no pain. They also gave him 7 days of Cephalexin which he has completed.    Review of Systems  Constitutional: Negative.   Respiratory: Negative.   Cardiovascular: Negative.   Skin: Positive for wound.  Neurological: Negative.        Objective:   Physical Exam  Constitutional: He appears well-developed and well-nourished.  Cardiovascular: Normal rate, regular rhythm, normal heart sounds and intact distal pulses.  Pulmonary/Chest: Effort normal and breath sounds normal.  Skin:  The left lower leg has a closed laceration about 4 cm in length. It looks clean, not tender           Assessment & Plan:  Laceration. All sutures were removed. He will continue to dress the wound for another week. Recheck prn.  Alysia Penna, MD

## 2018-04-08 ENCOUNTER — Encounter: Payer: Self-pay | Admitting: Family Medicine

## 2018-04-09 ENCOUNTER — Encounter: Payer: Self-pay | Admitting: Family Medicine

## 2018-04-09 ENCOUNTER — Ambulatory Visit (INDEPENDENT_AMBULATORY_CARE_PROVIDER_SITE_OTHER): Payer: BLUE CROSS/BLUE SHIELD | Admitting: Family Medicine

## 2018-04-09 VITALS — BP 140/68 | HR 80 | Temp 98.4°F | Ht 75.0 in | Wt 302.4 lb

## 2018-04-09 DIAGNOSIS — L03116 Cellulitis of left lower limb: Secondary | ICD-10-CM

## 2018-04-09 MED ORDER — CEPHALEXIN 500 MG PO CAPS: 500.0000 mg | ORAL_CAPSULE | Freq: Four times a day (QID) | ORAL | 0 refills | Status: DC

## 2018-04-09 NOTE — Progress Notes (Signed)
   Subjective:    Patient ID: VI WHITESEL, male    DOB: 03/15/1961, 57 y.o.   MRN: [id number]  HPI Here with concerns about infection at a wound site. He fell off a ladder and sustained a laceration to the left lower leg a few weeks ago, and this was closed with sutures. He took a week of Keflex at that time. Then we saw him on 04-02-18 to have the sutures removed. At that time the wound looked clean. However over the past 2 days the area around it has turned red and it burns a little. No fever.    Review of Systems  Constitutional: Negative.   Respiratory: Negative.   Cardiovascular: Negative.   Skin: Positive for color change and wound.       Objective:   Physical Exam  Constitutional: He appears well-developed and well-nourished.  Cardiovascular: Normal rate, regular rhythm, normal heart sounds and intact distal pulses.  Pulmonary/Chest: Effort normal and breath sounds normal.  Skin:  The laceration itself on the left shin looks good, but there is a zone of erythema and warmth around it          Assessment & Plan:  Cellulitis, treat with 10 days of Keflex. Recheck in 5 days. Alysia Penna, MD

## 2018-04-19 IMAGING — DX DG HIP (WITH OR WITHOUT PELVIS) 1V PORT*L*
2 series · 2 of 2 positions shown · non-contrast
Comparison: None.

CLINICAL DATA: Postop left total hip arthroplasty.

EXAM:
DG HIP (WITH OR WITHOUT PELVIS) 1V PORT LEFT

[pelvis ap]
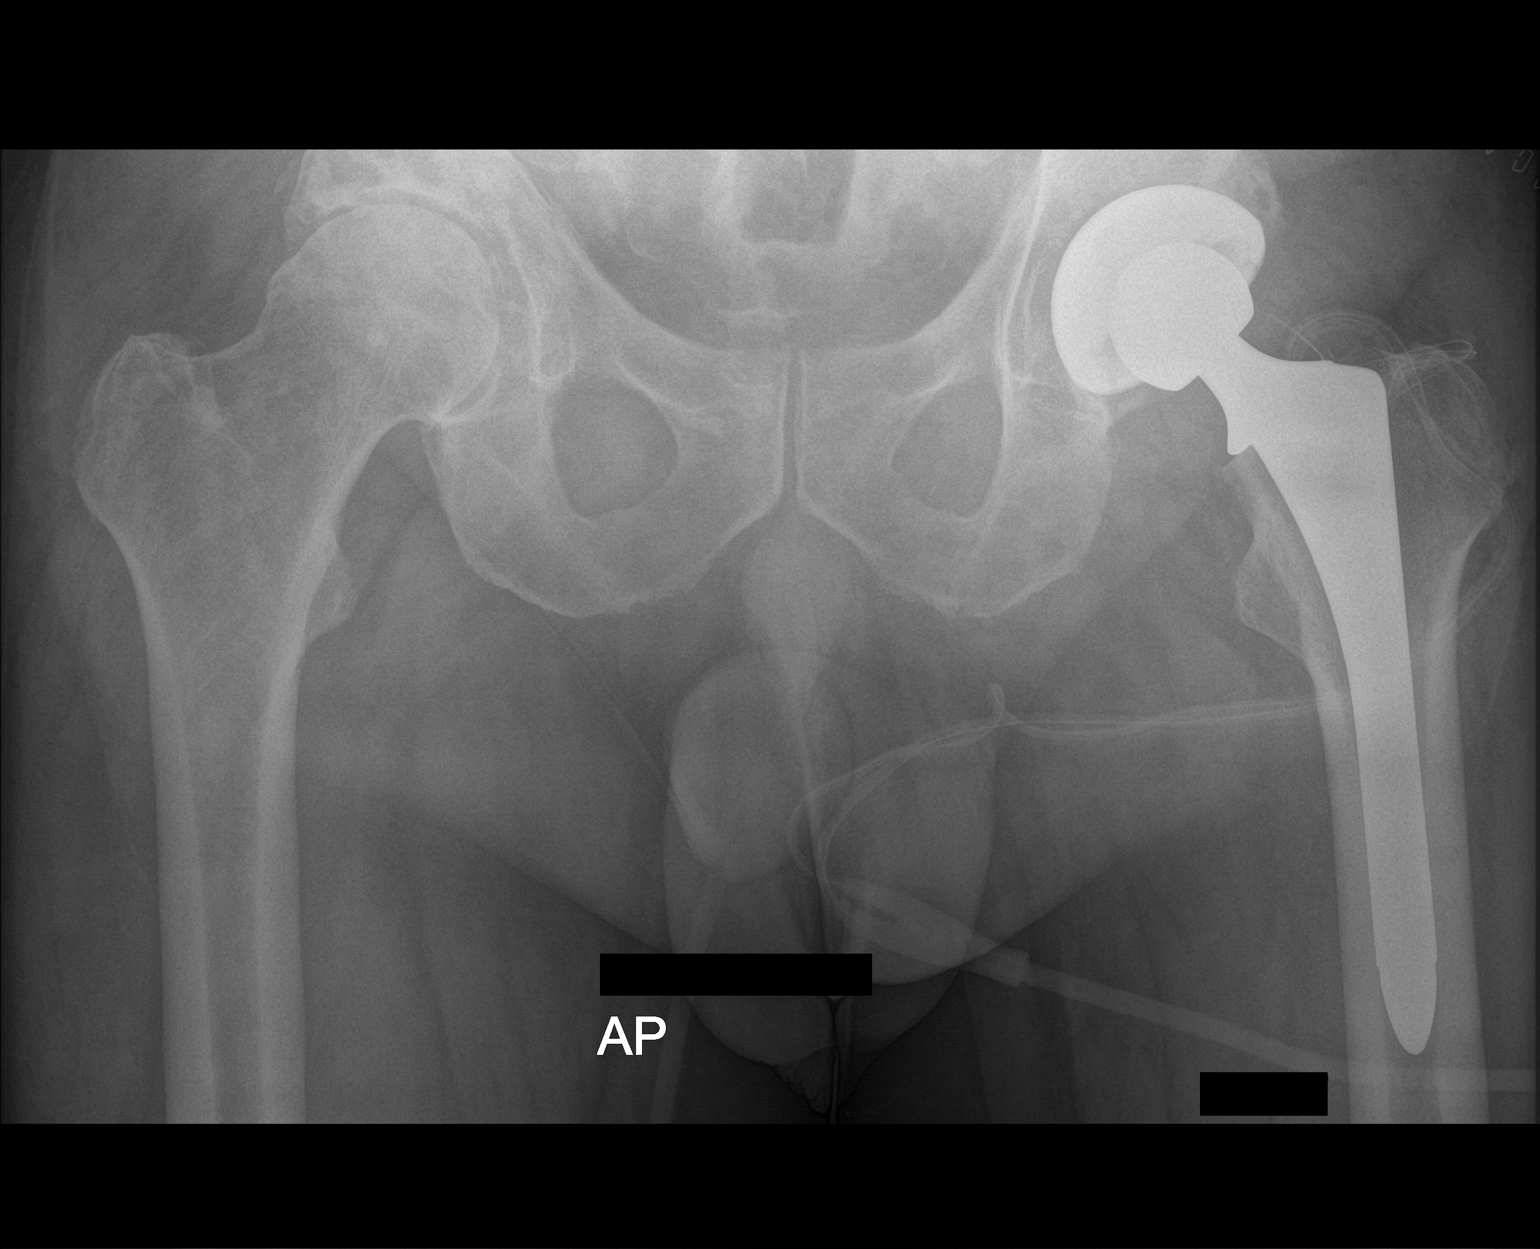

[hip lat]
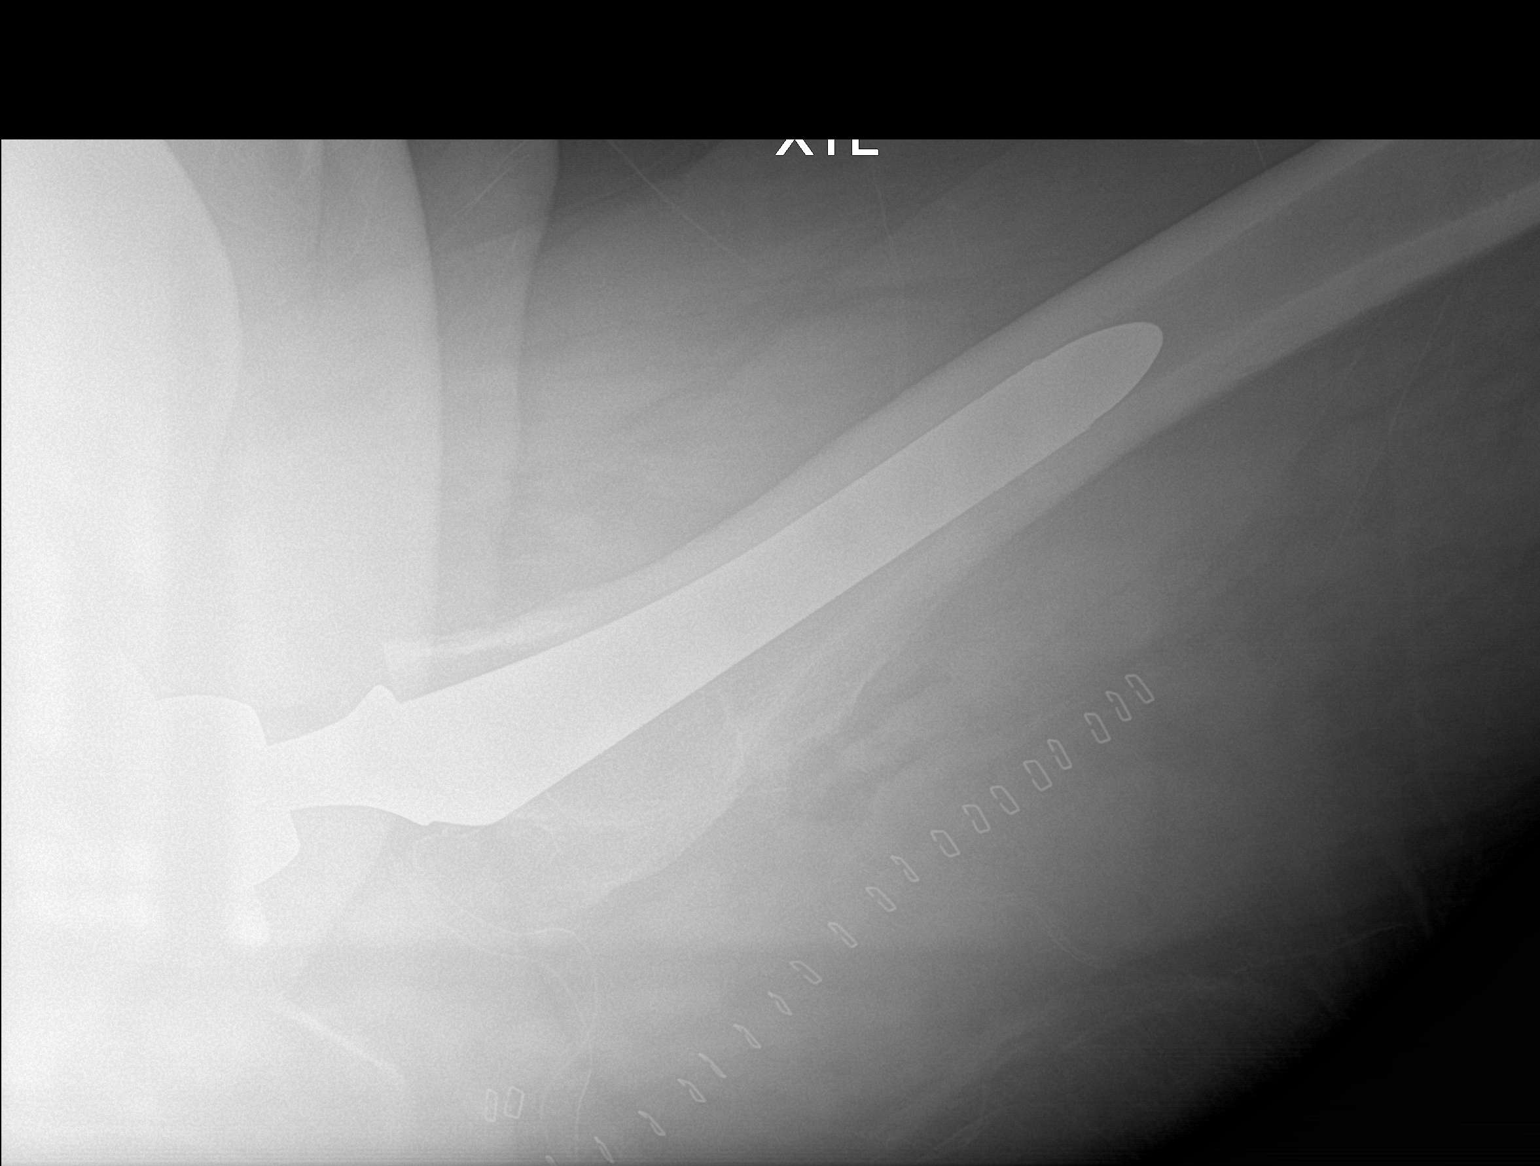

[2 of 2 positions shown; findings below may reference images not displayed]

FINDINGS: 6643 hours. Portable AP view of the lower pelvis and cross-table
lateral view of the left hip demonstrates interval left total hip
arthroplasty with a non screw fixed acetabular component. The
hardware is well positioned. There is no evidence of acute fracture
or dislocation. A surgical drain is in place. There is mild
periarticular soft tissue emphysema. Underlying right hip
degenerative changes are noted.
IMPRESSION: No demonstrated complication following left total hip arthroplasty.

## 2018-05-23 ENCOUNTER — Ambulatory Visit (INDEPENDENT_AMBULATORY_CARE_PROVIDER_SITE_OTHER): Payer: BLUE CROSS/BLUE SHIELD

## 2018-05-23 ENCOUNTER — Telehealth: Payer: Self-pay | Admitting: *Deleted

## 2018-05-23 ENCOUNTER — Encounter: Payer: Self-pay | Admitting: Family Medicine

## 2018-05-23 ENCOUNTER — Ambulatory Visit (INDEPENDENT_AMBULATORY_CARE_PROVIDER_SITE_OTHER): Payer: BLUE CROSS/BLUE SHIELD | Admitting: Family Medicine

## 2018-05-23 VITALS — BP 138/72 | HR 66 | Temp 98.6°F | Wt 304.6 lb

## 2018-05-23 DIAGNOSIS — R05 Cough: Secondary | ICD-10-CM | POA: Diagnosis not present

## 2018-05-23 DIAGNOSIS — R0602 Shortness of breath: Secondary | ICD-10-CM | POA: Diagnosis not present

## 2018-05-23 DIAGNOSIS — J189 Pneumonia, unspecified organism: Secondary | ICD-10-CM | POA: Diagnosis not present

## 2018-05-23 DIAGNOSIS — R059 Cough, unspecified: Secondary | ICD-10-CM

## 2018-05-23 MED ORDER — CEFTRIAXONE SODIUM 1 G IJ SOLR
1.0000 g | Freq: Once | INTRAMUSCULAR | Status: AC
  Administered 2018-05-23: 1 g via INTRAMUSCULAR

## 2018-05-23 MED ORDER — METHYLPREDNISOLONE ACETATE 80 MG/ML IJ SUSP
120.0000 mg | Freq: Once | INTRAMUSCULAR | Status: AC
Start: 2018-05-23 — End: 2018-05-23
  Administered 2018-05-23: 120 mg via INTRAMUSCULAR

## 2018-05-23 MED ORDER — AMOXICILLIN-POT CLAVULANATE 875-125 MG PO TABS: 1.0000 | ORAL_TABLET | Freq: Two times a day (BID) | ORAL | 0 refills | Status: DC

## 2018-05-23 NOTE — Telephone Encounter (Signed)
Patient walked into office c/o an episode of lightheadedness today as he was walking into a restaurant. "I felt like I was going to faint. I almost had to grab the hostess to keep from falling." After this, he checked his BP at 2 drug stores: 178/79, 153/82. Pharmacist recommended he contact his PCP.  Currently, he reports feeling clammy, "cold sweat", cough productive of white mucus. He also notes that he has not been sleeping well and sometimes gets chest congestion when this happens. He denies dizziness, chest pain, SOB, weakness/numbness/tingling of extremities. Reports "I just don't feel right."  VS: O: 97 P: 67 BP: 152/68 138/72 Wt: 304.6 T: 98.6 oral  Patient is A&O in no distress.  Given complaints of not feeling right, scheduled same day appt w/ Dr. Sarajane Jews.

## 2018-05-23 NOTE — Progress Notes (Signed)
   Subjective:    Patient ID: Christopher Marsh, male    DOB: 05/26/1961, 57 y.o.   MRN: [id number]  HPI Here as a walk in complaining of the sudden onset about 11:00 today of lightheadedness and feeling clammy. He went to 2 different pharmacies this afternoon to check his BP, and he got first 178/80 and then 158/78. He notes that he has had a cough producing white sputum for several days. He has had some SOB but no chest pain. No nausea.    Review of Systems  Constitutional: Positive for diaphoresis and fatigue.  HENT: Negative.   Eyes: Negative.   Respiratory: Positive for cough and shortness of breath. Negative for wheezing.   Cardiovascular: Negative.   Gastrointestinal: Negative.   Genitourinary: Negative.   Neurological: Positive for light-headedness. Negative for dizziness and headaches.       Objective:   Physical Exam  Constitutional: He is oriented to person, place, and time. He appears well-developed and well-nourished. No distress.  HENT:  Right Ear: External ear normal.  Left Ear: External ear normal.  Nose: Nose normal.  Mouth/Throat: Oropharynx is clear and moist.  Eyes: Conjunctivae are normal.  Neck: Neck supple. No thyromegaly present.  Cardiovascular: Normal rate, regular rhythm, normal heart sounds and intact distal pulses.  Pulmonary/Chest: Effort normal. He has no wheezes.  Diffuse rales on the right side, especially the middle and lower areas   Abdominal: Soft. Bowel sounds are normal. He exhibits no distension and no mass. There is no tenderness. There is no rebound and no guarding. No hernia.  Lymphadenopathy:    He has no cervical adenopathy.  Neurological: He is alert and oriented to person, place, and time.   A CXR today confirms a RML pneumonia.         Assessment & Plan:  CAP in the RML. Given a shot of Rocephin, to be followed by Augmentin. He will rest this weekend and stay out of the heat. Drink fluids. Recheck here next week.   Alysia Penna,  MD

## 2018-06-02 ENCOUNTER — Ambulatory Visit (INDEPENDENT_AMBULATORY_CARE_PROVIDER_SITE_OTHER): Payer: BLUE CROSS/BLUE SHIELD | Admitting: Family Medicine

## 2018-06-02 ENCOUNTER — Encounter: Payer: Self-pay | Admitting: Family Medicine

## 2018-06-02 VITALS — BP 138/64 | HR 94 | Temp 97.7°F | Wt 306.6 lb

## 2018-06-02 DIAGNOSIS — J181 Lobar pneumonia, unspecified organism: Secondary | ICD-10-CM | POA: Diagnosis not present

## 2018-06-02 DIAGNOSIS — J189 Pneumonia, unspecified organism: Secondary | ICD-10-CM

## 2018-06-02 MED ORDER — LEVOFLOXACIN 500 MG PO TABS: 500.0000 mg | ORAL_TABLET | Freq: Every day | ORAL | 0 refills | Status: AC

## 2018-06-02 NOTE — Progress Notes (Signed)
   Subjective:    Patient ID: Christopher Marsh, male    DOB: 01/27/1961, 57 y.o.   MRN: [id number]  HPI Here to follow up a RML pneumonia. We saw him last week for elevated BP, fatigue, and a productive cough. We heard a pneumonia on exam, and this was confirmed by CXR. He was given a shot of Rocephin and started on Augmentin. Since then he has felt somewhat better but he still has a dry cough. No fever.    Review of Systems  Constitutional: Positive for fatigue. Negative for fever.  Respiratory: Positive for cough. Negative for shortness of breath and wheezing.   Cardiovascular: Negative.   Neurological: Negative.        Objective:   Physical Exam  Constitutional: He appears well-developed and well-nourished.  Neck: No thyromegaly present.  Cardiovascular: Normal rate, regular rhythm, normal heart sounds and intact distal pulses.  Pulmonary/Chest: Effort normal. No stridor. No respiratory distress. He has no wheezes.  He still has rales in the right lower lung area   Lymphadenopathy:    He has no cervical adenopathy.          Assessment & Plan:  Partially treated CAP. Switch to Levaquin for 10 days. Recheck prn.  Alysia Penna, MD

## 2018-06-23 ENCOUNTER — Encounter: Payer: Self-pay | Admitting: Family Medicine

## 2018-06-23 ENCOUNTER — Ambulatory Visit (INDEPENDENT_AMBULATORY_CARE_PROVIDER_SITE_OTHER): Payer: BLUE CROSS/BLUE SHIELD | Admitting: Family Medicine

## 2018-06-23 ENCOUNTER — Ambulatory Visit (INDEPENDENT_AMBULATORY_CARE_PROVIDER_SITE_OTHER): Payer: BLUE CROSS/BLUE SHIELD

## 2018-06-23 VITALS — BP 150/60 | HR 92 | Temp 98.2°F | Ht 75.0 in | Wt 301.8 lb

## 2018-06-23 DIAGNOSIS — E042 Nontoxic multinodular goiter: Secondary | ICD-10-CM

## 2018-06-23 DIAGNOSIS — I10 Essential (primary) hypertension: Secondary | ICD-10-CM

## 2018-06-23 DIAGNOSIS — J181 Lobar pneumonia, unspecified organism: Secondary | ICD-10-CM

## 2018-06-23 DIAGNOSIS — N289 Disorder of kidney and ureter, unspecified: Secondary | ICD-10-CM

## 2018-06-23 DIAGNOSIS — Z0000 Encounter for general adult medical examination without abnormal findings: Secondary | ICD-10-CM | POA: Diagnosis not present

## 2018-06-23 DIAGNOSIS — L989 Disorder of the skin and subcutaneous tissue, unspecified: Secondary | ICD-10-CM

## 2018-06-23 DIAGNOSIS — J189 Pneumonia, unspecified organism: Secondary | ICD-10-CM

## 2018-06-23 NOTE — Progress Notes (Signed)
   Subjective:    Patient ID: Christopher Marsh, male    DOB: 07-11-1961, 57 y.o.   MRN: [id number]  HPI Here for an intro visit and to follow up a RML penumonia. This was diagnosed 4 weeks ago on exam and on a CXR. He was treated with Augmentin at first and then with Levaquin. He feels much better and has more energy, but he still has an occasional dry cough and he can sometimes feel a "rattle" in his chest. He is taking it easy and getting plenty of rest. Otherwise his BP has been stable. He had a wellness exam in January and at that time his A1c was stable at 6.9. He has never had a colonoscopy. He asks me to check a lesion on the back that appeared about a year ago. It sometimes itches or rubs on his shirt.   Review of Systems  Constitutional: Negative.   Respiratory: Positive for cough. Negative for chest tightness, shortness of breath and wheezing.   Cardiovascular: Negative.   Gastrointestinal: Negative.   Genitourinary: Negative.   Neurological: Negative.        Objective:   Physical Exam  Constitutional: He is oriented to person, place, and time. He appears well-developed and well-nourished.  Neck: No thyromegaly present.  Cardiovascular: Normal rate, regular rhythm, normal heart sounds and intact distal pulses.  Pulmonary/Chest: Effort normal. No stridor. No respiratory distress. He has no wheezes.  He still has some rales in the right posterior base   Lymphadenopathy:    He has no cervical adenopathy.  Neurological: He is alert and oriented to person, place, and time.  Skin:  The left lower back has a raised scaly actinic lesion which is about 1.5 cm in diameter           Assessment & Plan:  His RML pneumonia seems to be improving clinically but I am concerned that the rales are still so prevalent. We will get a follow up CXR today. He needs another pneumococcal vaccine and a flu shot, but we will wait a bit longer so we can get this pneumonia cleared up. His diabetes and  HTN are stable. We will set him up for a colonoscopy. I am concerned the skin lesion could be a squamous cell cancer so we will refer him to Dermatology to remove it.  Alysia Penna, MD

## 2018-06-24 NOTE — Progress Notes (Unsigned)
The CT was ordered

## 2018-06-27 ENCOUNTER — Encounter: Payer: Self-pay | Admitting: Family Medicine

## 2018-07-17 DIAGNOSIS — Z96642 Presence of left artificial hip joint: Secondary | ICD-10-CM | POA: Diagnosis not present

## 2018-07-18 DIAGNOSIS — Z1211 Encounter for screening for malignant neoplasm of colon: Secondary | ICD-10-CM | POA: Diagnosis not present

## 2018-09-02 DIAGNOSIS — C44529 Squamous cell carcinoma of skin of other part of trunk: Secondary | ICD-10-CM | POA: Diagnosis not present

## 2018-09-02 DIAGNOSIS — D485 Neoplasm of uncertain behavior of skin: Secondary | ICD-10-CM | POA: Diagnosis not present

## 2018-09-15 ENCOUNTER — Encounter: Payer: Self-pay | Admitting: Family Medicine

## 2018-09-16 NOTE — Telephone Encounter (Signed)
Called and spoke with the pt and the lab appt is set up for 11/22 at 9.

## 2018-09-19 ENCOUNTER — Other Ambulatory Visit (INDEPENDENT_AMBULATORY_CARE_PROVIDER_SITE_OTHER): Payer: BLUE CROSS/BLUE SHIELD

## 2018-09-19 DIAGNOSIS — I10 Essential (primary) hypertension: Secondary | ICD-10-CM

## 2018-09-19 LAB — BASIC METABOLIC PANEL
BUN: 8 mg/dL (ref 6–23)
CALCIUM: 8.9 mg/dL (ref 8.4–10.5)
CO2: 29 mEq/L (ref 19–32)
Chloride: 102 mEq/L (ref 96–112)
Creatinine, Ser: 0.86 mg/dL (ref 0.40–1.50)
GFR: 96.64 mL/min (ref >60.00)
Glucose, Bld: 177 mg/dL — ABNORMAL HIGH (ref 70–99)
POTASSIUM: 4.5 meq/L (ref 3.5–5.1)
SODIUM: 140 meq/L (ref 135–145)

## 2018-09-22 ENCOUNTER — Encounter: Payer: Self-pay | Admitting: *Deleted

## 2018-09-23 ENCOUNTER — Ambulatory Visit
Admission: RE | Admit: 2018-09-23 | Discharge: 2018-09-23 | Disposition: A | Discharge status: Home / Self Care | Payer: BLUE CROSS/BLUE SHIELD | Source: Ambulatory Visit | Attending: Internal Medicine | Admitting: Internal Medicine

## 2018-09-23 ENCOUNTER — Ambulatory Visit: Payer: BLUE CROSS/BLUE SHIELD | Admitting: Certified Registered Nurse Anesthetist

## 2018-09-23 ENCOUNTER — Encounter: Payer: Self-pay | Admitting: Anesthesiology

## 2018-09-23 ENCOUNTER — Encounter
Admission: RE | Disposition: A | Discharge status: Home / Self Care | Payer: Self-pay | Source: Ambulatory Visit | Attending: Internal Medicine

## 2018-09-23 DIAGNOSIS — E119 Type 2 diabetes mellitus without complications: Secondary | ICD-10-CM | POA: Diagnosis not present

## 2018-09-23 DIAGNOSIS — K635 Polyp of colon: Secondary | ICD-10-CM | POA: Diagnosis not present

## 2018-09-23 DIAGNOSIS — Z87891 Personal history of nicotine dependence: Secondary | ICD-10-CM | POA: Insufficient documentation

## 2018-09-23 DIAGNOSIS — E785 Hyperlipidemia, unspecified: Secondary | ICD-10-CM | POA: Diagnosis not present

## 2018-09-23 DIAGNOSIS — Z7984 Long term (current) use of oral hypoglycemic drugs: Secondary | ICD-10-CM | POA: Insufficient documentation

## 2018-09-23 DIAGNOSIS — I10 Essential (primary) hypertension: Secondary | ICD-10-CM | POA: Diagnosis not present

## 2018-09-23 DIAGNOSIS — D125 Benign neoplasm of sigmoid colon: Secondary | ICD-10-CM | POA: Insufficient documentation

## 2018-09-23 DIAGNOSIS — Z79899 Other long term (current) drug therapy: Secondary | ICD-10-CM | POA: Insufficient documentation

## 2018-09-23 DIAGNOSIS — K640 First degree hemorrhoids: Secondary | ICD-10-CM | POA: Insufficient documentation

## 2018-09-23 DIAGNOSIS — Z86718 Personal history of other venous thrombosis and embolism: Secondary | ICD-10-CM | POA: Diagnosis not present

## 2018-09-23 DIAGNOSIS — Z1211 Encounter for screening for malignant neoplasm of colon: Secondary | ICD-10-CM | POA: Insufficient documentation

## 2018-09-23 HISTORY — PX: COLONOSCOPY WITH PROPOFOL: SHX5780

## 2018-09-23 LAB — GLUCOSE, CAPILLARY: Glucose-Capillary: 152 mg/dL — ABNORMAL HIGH (ref 70–99)

## 2018-09-23 SURGERY — COLONOSCOPY WITH PROPOFOL
Anesthesia: General

## 2018-09-23 MED ORDER — SODIUM CHLORIDE 0.9 % IV SOLN
INTRAVENOUS | Status: DC
  Administered 2018-09-23: 1000 mL via INTRAVENOUS

## 2018-09-23 MED ORDER — SODIUM CHLORIDE 0.9 % IV SOLN: 1.0000 "application " | Freq: Once | INTRAVENOUS | Status: DC

## 2018-09-23 MED ORDER — PROPOFOL 10 MG/ML IV BOLUS
INTRAVENOUS | Status: DC | PRN
  Administered 2018-09-23: 20 mg via INTRAVENOUS
  Administered 2018-09-23: 80 mg via INTRAVENOUS

## 2018-09-23 MED ORDER — PROPOFOL 500 MG/50ML IV EMUL
INTRAVENOUS | Status: DC | PRN
  Administered 2018-09-23: 180 ug/kg/min via INTRAVENOUS

## 2018-09-23 MED ORDER — CEFAZOLIN SODIUM-DEXTROSE 1-4 GM/50ML-% IV SOLN
INTRAVENOUS | Status: AC
  Administered 2018-09-23: 1000 mg
  Filled 2018-09-23: qty 50

## 2018-09-23 MED ORDER — PROPOFOL 500 MG/50ML IV EMUL
INTRAVENOUS | Status: AC
  Filled 2018-09-23: qty 50

## 2018-09-23 NOTE — Op Note (Signed)
[facility] Patient Name: Christopher Marsh Procedure Date: 09/23/2018 9:38 AM MRN: [id number] Account #: [id number] Date of Birth: [birth year] Admit Type: Outpatient Age: 57 Room: [facility] ENDO ROOM 2 Gender: Male Note Status: Finalized Procedure:            Colonoscopy Indications:          Screening for colorectal malignant neoplasm Providers:            Benay Pike. Alice Reichert MD, MD Referring MD:         Ishmael Holter. Sarajane Jews, MD (Referring MD) Medicines:            Propofol per Anesthesia Complications:        No immediate complications. Procedure:            Pre-Anesthesia Assessment:                       - The risks and benefits of the procedure and the                        sedation options and risks were discussed with the                        patient. All questions were answered and informed                        consent was obtained.                       - Patient identification and proposed procedure were                        verified prior to the procedure by the nurse. The                        procedure was verified in the procedure room.                       - ASA Grade Assessment: III - A patient with severe                        systemic disease.                       - After reviewing the risks and benefits, the patient                        was deemed in satisfactory condition to undergo the                        procedure.                       After obtaining informed consent, the colonoscope was                        passed under direct vision. Throughout the procedure,                        the patient's blood pressure, pulse, and oxygen  saturations were monitored continuously. The                        Colonoscope was introduced through the anus and                        advanced to the the cecum, identified by appendiceal                        orifice and ileocecal valve. The colonoscopy was                  performed without difficulty. The patient tolerated the                        procedure well. The quality of the bowel preparation                        was excellent. The ileocecal valve, appendiceal                        orifice, and rectum were photographed. Findings:      The perianal and digital rectal examinations were normal. Pertinent       negatives include normal sphincter tone and no palpable rectal lesions.      A 12 mm polyp was found in the distal sigmoid colon. The polyp was       pedunculated. The polyp was removed with a hot snare. Resection and       retrieval were complete.      Non-bleeding internal hemorrhoids were found during retroflexion. The       hemorrhoids were Grade I (internal hemorrhoids that do not prolapse).      The exam was otherwise without abnormality. Impression:           - One 12 mm polyp in the distal sigmoid colon, removed                        with a hot snare. Resected and retrieved.                       - Non-bleeding internal hemorrhoids.                       - The examination was otherwise normal. Recommendation:       - Patient has a contact number available for                        emergencies. The signs and symptoms of potential                        delayed complications were discussed with the patient.                        Return to normal activities tomorrow. Written discharge                        instructions were provided to the patient.                       - Resume previous diet.                       -  Continue present medications.                       - Repeat colonoscopy is recommended for surveillance.                        The colonoscopy date will be determined after pathology                        results from today's exam become available for review.                       - Return to GI office PRN.                       - The findings and recommendations were discussed with                         the patient and their spouse. Procedure Code(s):    --- Professional ---                       [phone], Colonoscopy, flexible; with removal of tumor(s),                        polyp(s), or other lesion(s) by snare technique Diagnosis Code(s):    --- Professional ---                       K64.0, First degree hemorrhoids                       D12.5, Benign neoplasm of sigmoid colon                       Z12.11, Encounter for screening for malignant neoplasm                        of colon CPT copyright 2018 American Medical Association. All rights reserved. The codes documented in this report are preliminary and upon coder review may  be revised to meet current compliance requirements. Efrain Sella MD, MD 09/23/2018 10:09:40 AM This report has been signed electronically. Number of Addenda: 0 Note Initiated On: 09/23/2018 9:38 AM Scope Withdrawal Time: 0 hours 11 minutes 35 seconds  Total Procedure Duration: 0 hours 21 minutes 0 seconds       [facility]

## 2018-09-23 NOTE — Interval H&P Note (Signed)
History and Physical Interval Note:  09/23/2018 9:32 AM  Annye Rusk  has presented today for surgery, with the diagnosis of CCA SCREENING  The various methods of treatment have been discussed with the patient and family. After consideration of risks, benefits and other options for treatment, the patient has consented to  Procedure(s): COLONOSCOPY WITH PROPOFOL (N/A) as a surgical intervention .  The patient's history has been reviewed, patient examined, no change in status, stable for surgery.  I have reviewed the patient's chart and labs.  Questions were answered to the patient's satisfaction.     [place], [place]

## 2018-09-23 NOTE — Anesthesia Preprocedure Evaluation (Signed)
Anesthesia Evaluation  Patient identified by MRN, date of birth, ID band Patient awake    Reviewed: Allergy & Precautions, NPO status , Patient's Chart, lab work & pertinent test results  History of Anesthesia Complications Negative for: history of anesthetic complications  Airway Mallampati: III  TM Distance: >3 FB Neck ROM: Full    Dental  (+) Caps, Dental Advidsory Given, Missing, Chipped   Pulmonary neg shortness of breath, asthma , neg sleep apnea, pneumonia, resolved, neg COPD, neg recent URI, former smoker,    breath sounds clear to auscultation- rhonchi (-) wheezing      Cardiovascular Exercise Tolerance: Good hypertension, (-) angina(-) CAD, (-) Past MI and (-) Cardiac Stents (-) dysrhythmias (-) Valvular Problems/Murmurs Rhythm:Regular Rate:Normal - Systolic murmurs and - Diastolic murmurs    Neuro/Psych negative neurological ROS  negative psych ROS   GI/Hepatic negative GI ROS, Neg liver ROS,   Endo/Other  diabetes, Oral Hypoglycemic Agents  Renal/GU negative Renal ROS     Musculoskeletal  (+) Arthritis ,   Abdominal (+) + obese,   Peds  Hematology negative hematology ROS (+)   Anesthesia Other Findings Past Medical History: No date: Arthritis No date: Diabetes mellitus without complication (HCC) No date: DVT (deep venous thrombosis) (HCC)     Comment:  Lt leg No date: Dyspnea No date: Elevated lipids No date: Hypertension No date: Lower extremity edema   Reproductive/Obstetrics negative OB ROS                             Anesthesia Physical  Anesthesia Plan  ASA: III  Anesthesia Plan: General   Post-op Pain Management:    Induction: Intravenous  PONV Risk Score and Plan: 2 and Propofol infusion and TIVA  Airway Management Planned: Natural Airway and Nasal Cannula  Additional Equipment:   Intra-op Plan:   Post-operative Plan:   Informed Consent: I have  reviewed the patients History and Physical, chart, labs and discussed the procedure including the risks, benefits and alternatives for the proposed anesthesia with the patient or authorized representative who has indicated his/her understanding and acceptance.   Dental advisory given  Plan Discussed with: Anesthesiologist and CRNA  Anesthesia Plan Comments:         Lab Results  Component Value Date   WBC 6.1 11/18/2017   HGB 16.3 11/18/2017   HCT 48.7 11/18/2017   MCV 89.8 11/18/2017   PLT 237.0 11/18/2017     Anesthesia Quick Evaluation

## 2018-09-23 NOTE — H&P (Signed)
Outpatient short stay form Pre-procedure 09/23/2018 9:31 AM Krystalle Pilkington K. Alice Reichert, M.D.  Primary Physician: Alysia Penna, M.D.  Reason for visit:  Colon cancer screening  History of present illness: Patient presents for colonoscopy for colon cancer screening. The patient denies complaints of abdominal pain, significant change in bowel habits, or rectal bleeding. Got 1 g Ancef prior to procedure for hx of recent orthopedic joint replacement.    Current Facility-Administered Medications:  .  0.9 %  sodium chloride infusion, , Intravenous, Continuous, [place], Benay Pike, MD, Last Rate: 20 mL/hr at 09/23/18 0835, 1,000 mL at 09/23/18 0835  Medications Prior to Admission  Medication Sig Dispense Refill Last Dose  . acyclovir (ZOVIRAX) 400 MG tablet Take 1 tablet (400 mg total) by mouth 3 (three) times daily as needed. 90 tablet 1 Past Month at Unknown time  . amLODipine (NORVASC) 5 MG tablet Take 1 tablet (5 mg total) by mouth daily. 90 tablet 3 09/22/2018 at Unknown time  . glucose blood test strip 1 each by Other route as needed for other. accu chek aviva plusUse as instructed   09/22/2018 at Unknown time  . metFORMIN (GLUCOPHAGE) 500 MG tablet TAKE 1 TABLET AT BREAKFAST 90 tablet 4 09/22/2018 at Unknown time  . pravastatin (PRAVACHOL) 20 MG tablet TAKE 1 TABLET ONCE DAILY. 90 tablet 4 09/22/2018 at Unknown time  . tadalafil (CIALIS) 20 MG tablet Take 0.5-1 tablets (10-20 mg total) by mouth every other day as needed for erectile dysfunction. 20 tablet 11 Past Month at Unknown time  . triamcinolone ointment (KENALOG) 0.1 % Apply topically 2 (two) times daily. 80 g 1 Past Month at Unknown time     No Known Allergies   Past Medical History:  Diagnosis Date  . Arthritis   . Diabetes mellitus without complication ([place])   . DVT (deep venous thrombosis) (HCC)    Lt leg  . Dyspnea   . Elevated lipids   . Hyperlipidemia   . Hypertension   . Lower extremity edema     Review of systems:   Otherwise negative.    Physical Exam  Gen: Alert, oriented. Appears stated age.  HEENT: [place]/AT. PERRLA. Lungs: CTA, no wheezes. CV: RR nl S1, S2. Abd: soft, benign, no masses. BS+ Ext: No edema. Pulses 2+    Planned procedures: Proceed with colonoscopy. The patient understands the nature of the planned procedure, indications, risks, alternatives and potential complications including but not limited to bleeding, infection, perforation, damage to internal organs and possible oversedation/side effects from anesthesia. The patient agrees and gives consent to proceed.  Please refer to procedure notes for findings, recommendations and patient disposition/instructions.     Disaya Walt K. Alice Reichert, M.D. Gastroenterology 09/23/2018  9:31 AM

## 2018-09-23 NOTE — Anesthesia Procedure Notes (Signed)
Performed by: Dellene Mcgroarty, CRNA Pre-anesthesia Checklist: Patient identified, Emergency Drugs available, Suction available, Patient being monitored and Timeout performed Patient Re-evaluated:Patient Re-evaluated prior to induction Oxygen Delivery Method: Nasal cannula Induction Type: IV induction       

## 2018-09-23 NOTE — Transfer of Care (Signed)
Immediate Anesthesia Transfer of Care Note  Patient: Christopher Marsh  Procedure(s) Performed: COLONOSCOPY WITH PROPOFOL (N/A )  Patient Location: PACU  Anesthesia Type:General  Level of Consciousness: awake and alert   Airway & Oxygen Therapy: Patient Spontanous Breathing  Post-op Assessment: Report given to RN and Post -op Vital signs reviewed and stable  Post vital signs: Reviewed and stable  Last Vitals:  Vitals Value Taken Time  BP 127/59 09/23/2018 10:11 AM  Temp 36.3 C 09/23/2018 10:11 AM  Pulse 75 09/23/2018 10:12 AM  Resp 17 09/23/2018 10:12 AM  SpO2 97 % 09/23/2018 10:12 AM  Vitals shown include unvalidated device data.  Last Pain:  Vitals:   09/23/18 1011  TempSrc: Tympanic  PainSc: 0-No pain         Complications: No apparent anesthesia complications

## 2018-09-23 NOTE — Anesthesia Post-op Follow-up Note (Signed)
Anesthesia QCDR form completed.        

## 2018-09-24 ENCOUNTER — Encounter: Payer: Self-pay | Admitting: Internal Medicine

## 2018-09-24 LAB — SURGICAL PATHOLOGY

## 2018-09-24 NOTE — Anesthesia Postprocedure Evaluation (Signed)
Anesthesia Post Note  Patient: Christopher Marsh  Procedure(s) Performed: COLONOSCOPY WITH PROPOFOL (N/A )  Patient location during evaluation: Endoscopy Anesthesia Type: General Level of consciousness: awake and alert Pain management: pain level controlled Vital Signs Assessment: post-procedure vital signs reviewed and stable Respiratory status: spontaneous breathing, nonlabored ventilation, respiratory function stable and patient connected to nasal cannula oxygen Cardiovascular status: blood pressure returned to baseline and stable Postop Assessment: no apparent nausea or vomiting Anesthetic complications: no     Last Vitals:  Vitals:   09/23/18 1011 09/23/18 1021  BP: (!) 127/59 (!) 154/77  Pulse: 72 65  Resp: 16 15  Temp: (!) 36.3 C   SpO2: 97% 100%    Last Pain:  Vitals:   09/24/18 0724  TempSrc:   PainSc: 0-No pain                 Martha Clan

## 2018-09-30 ENCOUNTER — Ambulatory Visit (INDEPENDENT_AMBULATORY_CARE_PROVIDER_SITE_OTHER)
Admission: RE | Admit: 2018-09-30 | Discharge: 2018-09-30 | Disposition: A | Discharge status: Home / Self Care | Payer: BLUE CROSS/BLUE SHIELD | Source: Ambulatory Visit | Attending: Family Medicine | Admitting: Family Medicine

## 2018-09-30 DIAGNOSIS — J181 Lobar pneumonia, unspecified organism: Secondary | ICD-10-CM

## 2018-09-30 DIAGNOSIS — J189 Pneumonia, unspecified organism: Secondary | ICD-10-CM

## 2018-09-30 DIAGNOSIS — R918 Other nonspecific abnormal finding of lung field: Secondary | ICD-10-CM | POA: Diagnosis not present

## 2018-09-30 MED ORDER — IOPAMIDOL (ISOVUE-300) INJECTION 61%
80.0000 mL | Freq: Once | INTRAVENOUS | Status: AC | PRN
  Administered 2018-09-30: 80 mL via INTRAVENOUS

## 2018-10-01 NOTE — Addendum Note (Signed)
Addended by: Alysia Penna A on: 10/01/2018 05:17 PM   Modules accepted: Orders

## 2018-10-08 ENCOUNTER — Other Ambulatory Visit: Payer: Self-pay | Admitting: Family Medicine

## 2018-10-08 ENCOUNTER — Encounter: Payer: Self-pay | Admitting: *Deleted

## 2018-10-08 MED ORDER — LEVOFLOXACIN 500 MG PO TABS: 500.0000 mg | ORAL_TABLET | Freq: Every day | ORAL | 0 refills | Status: DC

## 2018-10-13 ENCOUNTER — Ambulatory Visit
Admission: RE | Admit: 2018-10-13 | Discharge: 2018-10-13 | Disposition: A | Discharge status: Home / Self Care | Payer: BLUE CROSS/BLUE SHIELD | Source: Ambulatory Visit | Attending: Family Medicine | Admitting: Family Medicine

## 2018-10-13 DIAGNOSIS — E042 Nontoxic multinodular goiter: Secondary | ICD-10-CM

## 2018-10-13 DIAGNOSIS — N289 Disorder of kidney and ureter, unspecified: Secondary | ICD-10-CM

## 2018-10-13 DIAGNOSIS — N281 Cyst of kidney, acquired: Secondary | ICD-10-CM | POA: Diagnosis not present

## 2018-10-13 DIAGNOSIS — K760 Fatty (change of) liver, not elsewhere classified: Secondary | ICD-10-CM | POA: Diagnosis not present

## 2018-10-13 MED ORDER — GADOBENATE DIMEGLUMINE 529 MG/ML IV SOLN
20.0000 mL | Freq: Once | INTRAVENOUS | Status: AC | PRN
  Administered 2018-10-13: 20 mL via INTRAVENOUS

## 2018-10-14 ENCOUNTER — Encounter: Payer: Self-pay | Admitting: *Deleted

## 2018-10-20 ENCOUNTER — Encounter: Payer: Self-pay | Admitting: Family Medicine

## 2018-10-20 ENCOUNTER — Ambulatory Visit: Payer: BLUE CROSS/BLUE SHIELD | Admitting: Family Medicine

## 2018-10-20 VITALS — BP 160/62 | HR 87 | Temp 98.1°F | Wt 314.5 lb

## 2018-10-20 DIAGNOSIS — N281 Cyst of kidney, acquired: Secondary | ICD-10-CM

## 2018-10-20 DIAGNOSIS — Z23 Encounter for immunization: Secondary | ICD-10-CM

## 2018-10-20 DIAGNOSIS — E041 Nontoxic single thyroid nodule: Secondary | ICD-10-CM | POA: Diagnosis not present

## 2018-10-20 NOTE — Addendum Note (Signed)
Addended by: Elie Confer on: 10/20/2018 11:23 AM   Modules accepted: Orders

## 2018-10-20 NOTE — Progress Notes (Signed)
   Subjective:    Patient ID: Christopher Marsh, male    DOB: 02/01/1961, 57 y.o.   MRN: [id number]  HPI Here to go over recent imaging studies. He had an abdominal MRI to look at some renal cysts, and this showed several benign cysts only. He also had an US of the thyroid, and this showed a multinodular goiter with a single dominant lesion. A FNA was advised. He feels well in general and he appears to have recovered from his recent pneumonia.    Review of Systems  Constitutional: Negative.   Respiratory: Negative.   Cardiovascular: Negative.   Endocrine: Negative.        Objective:   Physical Exam Constitutional:      Appearance: Normal appearance.  Neck:     Musculoskeletal: Normal range of motion.     Comments: The left thyroid lobe is mildly prominent, but not nodular or tender  Lymphadenopathy:     Cervical: No cervical adenopathy.  Neurological:     Mental Status: He is alert.           Assessment & Plan:  The liver cysts are benign and require no follow up. He has a concerning nodule in the left thyroid lobe and we will arrange for this to be biopsied.  Alysia Penna, MD

## 2018-10-24 ENCOUNTER — Encounter: Payer: Self-pay | Admitting: Family Medicine

## 2018-10-24 DIAGNOSIS — C44529 Squamous cell carcinoma of skin of other part of trunk: Secondary | ICD-10-CM | POA: Diagnosis not present

## 2018-11-06 ENCOUNTER — Other Ambulatory Visit: Payer: BLUE CROSS/BLUE SHIELD

## 2018-11-18 ENCOUNTER — Inpatient Hospital Stay: Admission: RE | Admit: 2018-11-18 | Payer: BLUE CROSS/BLUE SHIELD | Source: Ambulatory Visit

## 2018-11-19 ENCOUNTER — Encounter: Payer: Self-pay | Admitting: Family Medicine

## 2018-11-19 NOTE — Telephone Encounter (Signed)
Dr. Fry please advise. Thanks  

## 2018-11-21 NOTE — Telephone Encounter (Signed)
This should resolve in another day or so. Drink plenty of water and Gatorade. Use Imodium prn

## 2018-12-16 ENCOUNTER — Ambulatory Visit
Admission: RE | Admit: 2018-12-16 | Discharge: 2018-12-16 | Disposition: A | Discharge status: Home / Self Care | Payer: BLUE CROSS/BLUE SHIELD | Source: Ambulatory Visit | Attending: Family Medicine | Admitting: Family Medicine

## 2018-12-16 ENCOUNTER — Other Ambulatory Visit (HOSPITAL_COMMUNITY)
Admission: RE | Admit: 2018-12-16 | Discharge: 2018-12-16 | Disposition: A | Discharge status: Home / Self Care | Payer: BLUE CROSS/BLUE SHIELD | Source: Ambulatory Visit | Attending: Radiology | Admitting: Radiology

## 2018-12-16 DIAGNOSIS — E041 Nontoxic single thyroid nodule: Secondary | ICD-10-CM | POA: Diagnosis not present

## 2018-12-16 DIAGNOSIS — E079 Disorder of thyroid, unspecified: Secondary | ICD-10-CM | POA: Diagnosis not present

## 2018-12-19 ENCOUNTER — Encounter: Payer: Self-pay | Admitting: *Deleted

## 2018-12-20 DIAGNOSIS — E041 Nontoxic single thyroid nodule: Secondary | ICD-10-CM | POA: Diagnosis not present

## 2018-12-31 ENCOUNTER — Encounter (HOSPITAL_COMMUNITY): Payer: Self-pay

## 2019-01-08 ENCOUNTER — Other Ambulatory Visit: Payer: Self-pay | Admitting: Family Medicine

## 2019-01-08 NOTE — Telephone Encounter (Signed)
Requested medication (s) are due for refill today: yes  Requested medication (s) are on the active medication list: yes  Last refill:  Last refilled by Dr. Sherren Mocha.   Future visit scheduled: no  Notes to clinic:  LOV on 10/20/18 with Dr. Sarajane Jews. Current prescription is expired. Last refilled by Dr. Sherren Mocha    Requested Prescriptions  Pending Prescriptions Disp Refills   metFORMIN (GLUCOPHAGE) 500 MG tablet 90 tablet 4    Sig: TAKE 1 TABLET AT BREAKFAST     Endocrinology:  Diabetes - Biguanides Failed - 01/08/2019  6:51 PM      Failed - HBA1C is between 0 and 7.9 and within 180 days    Hgb A1c MFr Bld  Date Value Ref Range Status  11/18/2017 6.9 (H) 4.6 - 6.5 % Final    Comment:    Glycemic Control Guidelines for People with Diabetes:Non Diabetic:  <6%Goal of Therapy: <7%Additional Action Suggested:  >8%          Passed - Cr in normal range and within 360 days    Creatinine, Ser  Date Value Ref Range Status  09/19/2018 0.86 0.40 - 1.50 mg/dL Final         Passed - eGFR in normal range and within 360 days    GFR calc Af Amer  Date Value Ref Range Status  07/24/2017 >60 >60 mL/min Final    Comment:    (NOTE) The eGFR has been calculated using the CKD EPI equation. This calculation has not been validated in all clinical situations. eGFR's persistently <60 mL/min signify possible Chronic Kidney Disease.    GFR calc non Af Amer  Date Value Ref Range Status  07/24/2017 >60 >60 mL/min Final   GFR  Date Value Ref Range Status  09/19/2018 96.64 >60.00 mL/min Final         Passed - Valid encounter within last 6 months    Recent Outpatient Visits          2 months ago Thyroid nodule   [place] [facility] [place], [place]   6 months ago Community acquired pneumonia of right middle lobe of lung ([place])   [facility] [place], MD   7 months ago Community acquired pneumonia of right middle lobe of lung ([place])   [place] at  [place], MD   7 months ago Community acquired pneumonia of right lung, unspecified part of lung   [facility] at [place], MD   9 months ago Cellulitis of left lower leg   [facility] [facility], Ishmael Holter, MD

## 2019-01-08 NOTE — Telephone Encounter (Signed)
Copied from [place] [id number]. Topic: Quick Communication - Rx Refill/Question >> Jan 08, 2019  4:39 PM Percell Belt A wrote: Medication: metFORMIN (GLUCOPHAGE) 500 MG tablet [[id number]]   Has the patient contacted their pharmacy? yes (Agent: If no, request that the patient contact the pharmacy for the refill.) (Agent: If yes, when and what did the pharmacy advise?)  Preferred Pharmacy (with phone number or street name): [place], [place] [phone] (Phone)   Agent: Please be advised that RX refills may take up to 3 business days. We ask that you follow-up with your pharmacy.

## 2019-01-09 MED ORDER — METFORMIN HCL 500 MG PO TABS: ORAL_TABLET | ORAL | 1 refills | Status: DC

## 2019-01-18 ENCOUNTER — Encounter: Payer: Self-pay | Admitting: Family Medicine

## 2019-01-19 NOTE — Telephone Encounter (Signed)
Pt stated that he was dx with PNA in December.  Dr. Sarajane Jews Please advise. Thanks

## 2019-01-19 NOTE — Telephone Encounter (Signed)
Yes stay home for a few days and rest as he said, that is great advice

## 2019-01-22 ENCOUNTER — Other Ambulatory Visit: Payer: Self-pay | Admitting: Family Medicine

## 2019-03-21 IMAGING — DX DG CHEST 2V
2 series · 2 of 2 positions shown · non-contrast
Comparison: Radiographs May 23, 2018.

CLINICAL DATA: Right middle lobe pneumonia.

EXAM:
CHEST - 2 VIEW

[chest pa]
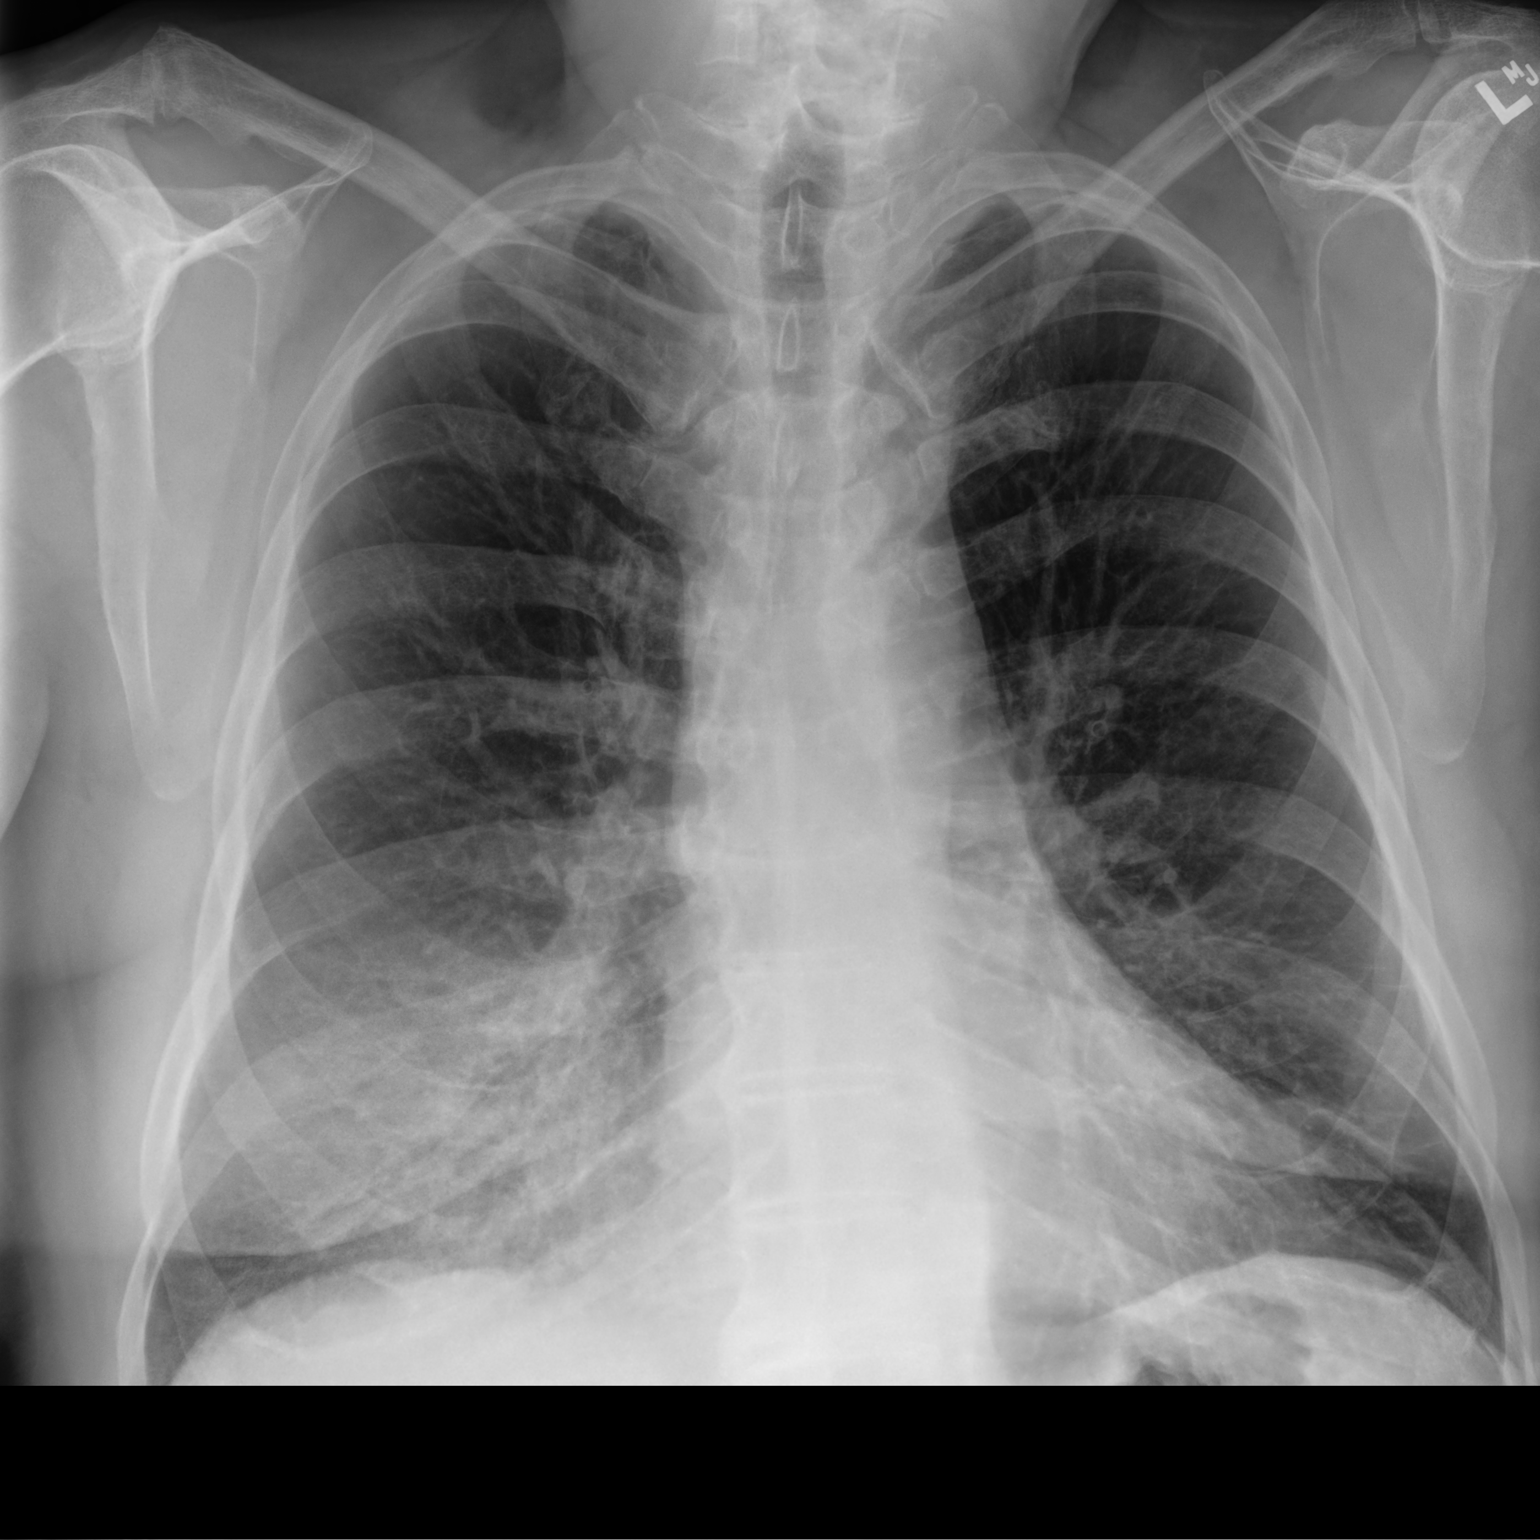

[chest lat]
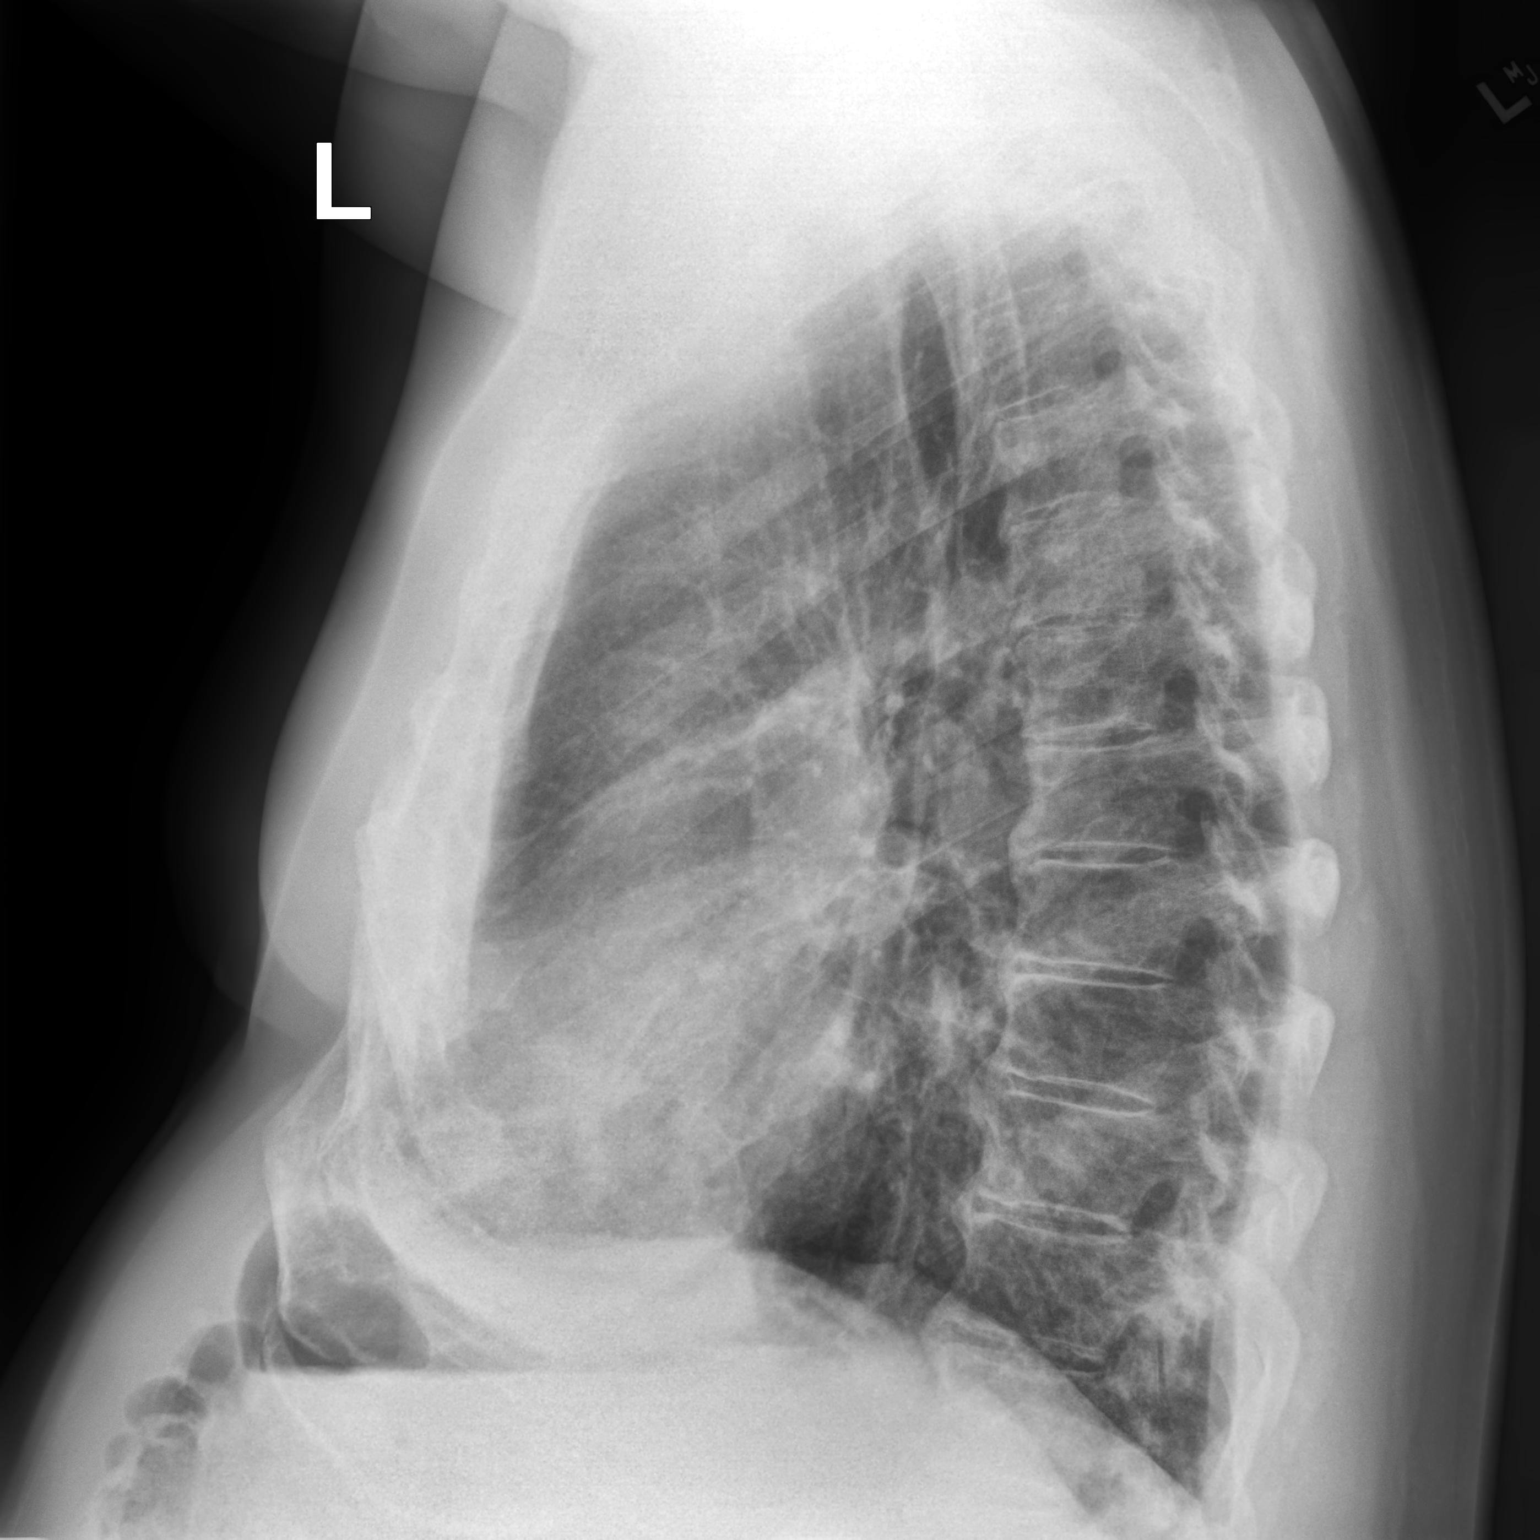

[2 of 2 positions shown; findings below may reference images not displayed]

FINDINGS: The heart size and mediastinal contours are within normal limits. No
pneumothorax or pleural effusion is noted. Left lung is clear.
Stable right middle lobe opacity is noted. The visualized skeletal
structures are unremarkable.
IMPRESSION: Stable right middle lobe opacity is noted. Given the lack of change
since prior exam, CT scan of the chest is recommended to rule out
underlying mass or neoplasm.

## 2019-04-19 ENCOUNTER — Other Ambulatory Visit: Payer: Self-pay | Admitting: Family Medicine

## 2019-04-20 NOTE — Telephone Encounter (Signed)
triamcinolone ointment (KENALOG) 0.1 %  [facility] calling in for ointment.Marland Kitchen after hrs. [place], [place] [phone] (Phone) [phone] (Fax

## 2019-04-24 NOTE — Telephone Encounter (Signed)
Copied from [place] [id number]. Topic: General - Other >> Apr 24, 2019  2:27 PM Rainey Pines A wrote: Pharmacy called and stated that they have faxed over a few times the request for patients pravastatin (PRAVACHOL) 20 MG tablet [Pharmacy Med Name: PRAVASTATIN SODIUM 20 MG TAB]

## 2019-04-25 ENCOUNTER — Other Ambulatory Visit: Payer: Self-pay | Admitting: Family Medicine

## 2019-05-27 ENCOUNTER — Ambulatory Visit (INDEPENDENT_AMBULATORY_CARE_PROVIDER_SITE_OTHER): Payer: BC Managed Care – PPO | Admitting: Family Medicine

## 2019-05-27 ENCOUNTER — Other Ambulatory Visit: Payer: Self-pay

## 2019-05-27 DIAGNOSIS — Z20828 Contact with and (suspected) exposure to other viral communicable diseases: Secondary | ICD-10-CM | POA: Diagnosis not present

## 2019-05-27 DIAGNOSIS — R05 Cough: Secondary | ICD-10-CM | POA: Diagnosis not present

## 2019-05-27 DIAGNOSIS — R059 Cough, unspecified: Secondary | ICD-10-CM

## 2019-05-27 MED ORDER — LEVOFLOXACIN 500 MG PO TABS: 500.0000 mg | ORAL_TABLET | Freq: Every day | ORAL | 0 refills | Status: DC

## 2019-05-27 NOTE — Progress Notes (Signed)
Patient ID: Christopher Marsh, male   DOB: October 12, 1961, 58 y.o.   MRN: [id number]   This visit type was conducted due to national recommendations for restrictions regarding the COVID-19 pandemic in an effort to limit this patient's exposure and mitigate transmission in our community.   Virtual Visit via Video Note  I connected with Christopher Marsh on 05/27/19 at  4:00 PM EDT by a video enabled telemedicine application and verified that I am speaking with the correct person using two identifiers.  Location patient: home Location provider:work or home office Persons participating in the virtual visit: patient, provider  I discussed the limitations of evaluation and management by telemedicine and the availability of in person appointments. The patient expressed understanding and agreed to proceed.   HPI: Patient had called with concerns for 1 day history of cough and feeling "clammy ".  He felt exactly this way about a year ago when he developed pneumonia.  He had prolonged illness and only seemed to improve after 3 courses of antibiotics. He had CT chest last December that showed some residual pneumonia right lower lobe.  Patient apparently did clear symptomatically after that go round with Levaquin.  No known exposures to coronavirus.  He denies any fever.  No dyspnea.  Does have increased fatigue.  No loss of taste or smell.  No GI symptoms.  Does have multiple chronic problems including history of hypertension, type 2 diabetes, hyperlipidemia, past history of hepatitis C, obesity   ROS: See pertinent positives and negatives per HPI.  Past Medical History:  Diagnosis Date  . Arthritis   . Diabetes mellitus without complication ([place])   . DVT (deep venous thrombosis) (HCC)    Lt leg  . Dyspnea   . Elevated lipids   . Hyperlipidemia   . Hypertension   . Lower extremity edema     Past Surgical History:  Procedure Laterality Date  . COLONOSCOPY WITH PROPOFOL N/A 09/23/2018   Procedure:  COLONOSCOPY WITH PROPOFOL;  Surgeon: [place], Benay Pike, MD;  Location: [facility] ENDOSCOPY;  Service: Gastroenterology;  Laterality: N/A;  . EYE MUSCLE SURGERY    . JOINT REPLACEMENT Left 07/22/2017  . TOTAL HIP ARTHROPLASTY Left 07/22/2017   Procedure: TOTAL HIP ARTHROPLASTY;  Surgeon: Dereck Leep, MD;  Location: [facility] ORS;  Service: Orthopedics;  Laterality: Left;    Family History  Problem Relation Age of Onset  . Emphysema Father     SOCIAL HX: Former smoker   Current Outpatient Medications:  .  acyclovir (ZOVIRAX) 400 MG tablet, Take 1 tablet (400 mg total) by mouth 3 (three) times daily as needed., Disp: 90 tablet, Rfl: 1 .  amLODipine (NORVASC) 5 MG tablet, TAKE 1 TABLET ONCE DAILY., Disp: 90 tablet, Rfl: 0 .  glucose blood test strip, 1 each by Other route as needed for other. accu chek aviva plusUse as instructed, Disp: , Rfl:  .  levofloxacin (LEVAQUIN) 500 MG tablet, Take 1 tablet (500 mg total) by mouth daily., Disp: 14 tablet, Rfl: 0 .  metFORMIN (GLUCOPHAGE) 500 MG tablet, TAKE 1 TABLET AT BREAKFAST, Disp: 90 tablet, Rfl: 1 .  pravastatin (PRAVACHOL) 20 MG tablet, TAKE 1 TABLET ONCE DAILY., Disp: 90 tablet, Rfl: 0 .  tadalafil (CIALIS) 20 MG tablet, Take 0.5-1 tablets (10-20 mg total) by mouth every other day as needed for erectile dysfunction., Disp: 20 tablet, Rfl: 11 .  triamcinolone ointment (KENALOG) 0.1 %, APPLY TO AFFECTED AREA TWICE A DAY., Disp: 80 g, Rfl: 0  EXAM:  VITALS per patient if applicable:  GENERAL: alert, oriented, appears well and in no acute distress  HEENT: atraumatic, conjunttiva clear, no obvious abnormalities on inspection of external nose and ears  NECK: normal movements of the head and neck  LUNGS: on inspection no signs of respiratory distress, breathing rate appears normal, no obvious gross SOB, gasping or wheezing  CV: no obvious cyanosis  MS: moves all visible extremities without noticeable abnormality  PSYCH/NEURO: pleasant and  cooperative, no obvious depression or anxiety, speech and thought processing grossly intact  ASSESSMENT AND PLAN:  Discussed the following assessment and plan:  Cough - Plan: Novel Coronavirus, NAA ([facility])  -Patient is currently in no respiratory distress.  Does have history of pneumonia that was difficult to clear last fall.  He has some increased risk factors as above.  -We recommended coronavirus screening and he was given information and order was entered into the computer.  He will go sometime tomorrow between 8 AM and 3:30 PM [facility] the [facility]  -We elected to go ahead and cover with Levaquin 500 mg once daily for 10 days  -Recommend plenty of fluids and rest and self quarantine until further evaluation.  We also recommend a low threshold to follow-up with Dr. Sarajane Jews for any persistent symptoms.  May need some further imaging if symptoms persist    I discussed the assessment and treatment plan with the patient. The patient was provided an opportunity to ask questions and all were answered. The patient agreed with the plan and demonstrated an understanding of the instructions.   The patient was advised to call back or seek an in-person evaluation if the symptoms worsen or if the condition fails to improve as anticipated.     Carolann Littler, MD

## 2019-05-28 ENCOUNTER — Other Ambulatory Visit: Payer: Self-pay

## 2019-05-28 DIAGNOSIS — R6889 Other general symptoms and signs: Secondary | ICD-10-CM | POA: Diagnosis not present

## 2019-05-28 DIAGNOSIS — Z20822 Contact with and (suspected) exposure to covid-19: Secondary | ICD-10-CM

## 2019-05-30 LAB — NOVEL CORONAVIRUS, NAA: SARS-CoV-2, NAA: NOT DETECTED

## 2019-05-31 ENCOUNTER — Encounter: Payer: Self-pay | Admitting: Family Medicine

## 2019-07-12 ENCOUNTER — Other Ambulatory Visit: Payer: Self-pay | Admitting: Family Medicine

## 2019-07-19 ENCOUNTER — Other Ambulatory Visit: Payer: Self-pay | Admitting: Family Medicine

## 2019-07-20 NOTE — Telephone Encounter (Signed)
Patient need to schedule an ov for more refills. 

## 2019-08-18 ENCOUNTER — Other Ambulatory Visit: Payer: Self-pay

## 2019-08-18 ENCOUNTER — Encounter: Payer: BC Managed Care – PPO | Admitting: Family Medicine

## 2019-08-18 ENCOUNTER — Telehealth: Payer: Self-pay | Admitting: Family Medicine

## 2019-08-18 MED ORDER — PRAVASTATIN SODIUM 20 MG PO TABS: 20.0000 mg | ORAL_TABLET | Freq: Every day | ORAL | 0 refills | Status: DC

## 2019-08-18 MED ORDER — AMLODIPINE BESYLATE 5 MG PO TABS: 5.0000 mg | ORAL_TABLET | Freq: Every day | ORAL | 0 refills | Status: DC

## 2019-08-18 NOTE — Telephone Encounter (Signed)
Patient needs Pravastatin and Amlodipine refilled.  Patient is out of medications.  [place]

## 2019-08-18 NOTE — Telephone Encounter (Signed)
rx refilled.

## 2019-08-21 ENCOUNTER — Other Ambulatory Visit: Payer: Self-pay | Admitting: Family Medicine

## 2019-08-25 ENCOUNTER — Other Ambulatory Visit: Payer: Self-pay | Admitting: Family Medicine

## 2019-08-31 ENCOUNTER — Other Ambulatory Visit: Payer: Self-pay | Admitting: Family Medicine

## 2019-09-01 ENCOUNTER — Other Ambulatory Visit: Payer: Self-pay | Admitting: Family Medicine

## 2019-09-09 ENCOUNTER — Other Ambulatory Visit: Payer: Self-pay

## 2019-09-09 ENCOUNTER — Encounter: Payer: Self-pay | Admitting: Family Medicine

## 2019-09-09 ENCOUNTER — Ambulatory Visit (INDEPENDENT_AMBULATORY_CARE_PROVIDER_SITE_OTHER): Payer: BC Managed Care – PPO | Admitting: Family Medicine

## 2019-09-09 VITALS — BP 140/80 | HR 76 | Temp 98.3°F | Ht 74.5 in | Wt 313.0 lb

## 2019-09-09 DIAGNOSIS — Z0000 Encounter for general adult medical examination without abnormal findings: Secondary | ICD-10-CM | POA: Diagnosis not present

## 2019-09-09 DIAGNOSIS — E11649 Type 2 diabetes mellitus with hypoglycemia without coma: Secondary | ICD-10-CM

## 2019-09-09 LAB — CBC WITH DIFFERENTIAL/PLATELET
Basophils Absolute: 0.1 10*3/uL (ref 0.0–0.1)
Basophils Relative: 1.2 % (ref 0.0–3.0)
Eosinophils Absolute: 0.1 10*3/uL (ref 0.0–0.7)
Eosinophils Relative: 1.9 % (ref 0.0–5.0)
HCT: 51 % (ref 39.0–52.0)
Hemoglobin: 17 g/dL (ref 13.0–17.0)
Lymphocytes Relative: 13.3 % (ref 12.0–46.0)
Lymphs Abs: 0.6 10*3/uL — ABNORMAL LOW (ref 0.7–4.0)
MCHC: 33.3 g/dL (ref 30.0–36.0)
MCV: 94.9 fl (ref 78.0–100.0)
Monocytes Absolute: 0.6 10*3/uL (ref 0.1–1.0)
Monocytes Relative: 12.1 % — ABNORMAL HIGH (ref 3.0–12.0)
Neutro Abs: 3.5 10*3/uL (ref 1.4–7.7)
Neutrophils Relative %: 71.5 % (ref 43.0–77.0)
Platelets: 229 10*3/uL (ref 150.0–400.0)
RBC: 5.37 Mil/uL (ref 4.22–5.81)
RDW: 14 % (ref 11.5–15.5)
WBC: 4.8 10*3/uL (ref 4.0–10.5)

## 2019-09-09 LAB — HEPATIC FUNCTION PANEL
ALT: 36 U/L (ref 0–53)
AST: 28 U/L (ref 0–37)
Albumin: 4.4 g/dL (ref 3.5–5.2)
Alkaline Phosphatase: 51 U/L (ref 39–117)
Bilirubin, Direct: 0.2 mg/dL (ref 0.0–0.3)
Total Bilirubin: 0.7 mg/dL (ref 0.2–1.2)
Total Protein: 6.7 g/dL (ref 6.0–8.3)

## 2019-09-09 LAB — BASIC METABOLIC PANEL
BUN: 8 mg/dL (ref 6–23)
CO2: 30 mEq/L (ref 19–32)
Calcium: 9 mg/dL (ref 8.4–10.5)
Chloride: 102 mEq/L (ref 96–112)
Creatinine, Ser: 0.8 mg/dL (ref 0.40–1.50)
GFR: 98.52 mL/min (ref >60.00)
Glucose, Bld: 121 mg/dL — ABNORMAL HIGH (ref 70–99)
Potassium: 4.3 mEq/L (ref 3.5–5.1)
Sodium: 139 mEq/L (ref 135–145)

## 2019-09-09 LAB — LIPID PANEL
Cholesterol: 209 mg/dL — ABNORMAL HIGH (ref 0–200)
HDL: 66.7 mg/dL (ref >39.00)
LDL Cholesterol: 125 mg/dL — ABNORMAL HIGH (ref 0–99)
NonHDL: 141.91
Total CHOL/HDL Ratio: 3
Triglycerides: 84 mg/dL (ref 0.0–149.0)
VLDL: 16.8 mg/dL (ref 0.0–40.0)

## 2019-09-09 LAB — PSA: PSA: 2.83 ng/mL (ref 0.10–4.00)

## 2019-09-09 LAB — HEMOGLOBIN A1C: Hgb A1c MFr Bld: 6.9 % — ABNORMAL HIGH (ref 4.6–6.5)

## 2019-09-09 LAB — TSH: TSH: 1.22 u[IU]/mL (ref 0.35–4.50)

## 2019-09-09 MED ORDER — METFORMIN HCL 500 MG PO TABS: ORAL_TABLET | ORAL | 3 refills | Status: DC

## 2019-09-09 MED ORDER — PRAVASTATIN SODIUM 20 MG PO TABS: 20.0000 mg | ORAL_TABLET | Freq: Every day | ORAL | 3 refills | Status: DC

## 2019-09-09 MED ORDER — AMLODIPINE BESYLATE 5 MG PO TABS: 5.0000 mg | ORAL_TABLET | Freq: Every day | ORAL | 3 refills | Status: DC

## 2019-09-09 MED ORDER — KETOCONAZOLE 2 % EX CREA: 1.0000 "application " | TOPICAL_CREAM | Freq: Two times a day (BID) | CUTANEOUS | 2 refills | Status: DC | PRN

## 2019-09-09 MED ORDER — TADALAFIL 20 MG PO TABS: 10.0000 mg | ORAL_TABLET | ORAL | 11 refills | Status: DC | PRN

## 2019-09-09 NOTE — Progress Notes (Signed)
   Subjective:    Patient ID: Christopher Marsh, male    DOB: 1961-09-01, 58 y.o.   MRN: [id number]  HPI Here for a well exam. He feels well. He is riding a recumbent bike at home to lose some weight.    Review of Systems  Constitutional: Negative.   HENT: Negative.   Eyes: Negative.   Respiratory: Negative.   Cardiovascular: Negative.   Gastrointestinal: Negative.   Genitourinary: Negative.   Musculoskeletal: Negative.   Skin: Negative.   Neurological: Negative.   Psychiatric/Behavioral: Negative.        Objective:   Physical Exam Constitutional:      General: He is not in acute distress.    Appearance: He is well-developed. He is obese. He is not diaphoretic.  HENT:     Head: Normocephalic and atraumatic.     Right Ear: External ear normal.     Left Ear: External ear normal.     Nose: Nose normal.     Mouth/Throat:     Pharynx: No oropharyngeal exudate.  Eyes:     General: No scleral icterus.       Right eye: No discharge.        Left eye: No discharge.     Conjunctiva/sclera: Conjunctivae normal.     Pupils: Pupils are equal, round, and reactive to light.  Neck:     Musculoskeletal: Neck supple.     Thyroid: No thyromegaly.     Vascular: No JVD.     Trachea: No tracheal deviation.  Cardiovascular:     Rate and Rhythm: Normal rate and regular rhythm.     Heart sounds: Normal heart sounds. No murmur. No friction rub. No gallop.   Pulmonary:     Effort: Pulmonary effort is normal. No respiratory distress.     Breath sounds: Normal breath sounds. No wheezing or rales.  Chest:     Chest wall: No tenderness.  Abdominal:     General: Bowel sounds are normal. There is no distension.     Palpations: Abdomen is soft. There is no mass.     Tenderness: There is no abdominal tenderness. There is no guarding or rebound.  Genitourinary:    Penis: Normal. No tenderness.      Scrotum/Testes: Normal.     Prostate: Normal.     Rectum: Normal. Guaiac result negative.   Musculoskeletal: Normal range of motion.        General: No tenderness.  Lymphadenopathy:     Cervical: No cervical adenopathy.  Skin:    General: Skin is warm and dry.     Coloration: Skin is not pale.     Findings: No erythema or rash.  Neurological:     Mental Status: He is alert and oriented to person, place, and time.     Cranial Nerves: No cranial nerve deficit.     Motor: No abnormal muscle tone.     Coordination: Coordination normal.     Deep Tendon Reflexes: Reflexes are normal and symmetric. Reflexes normal.  Psychiatric:        Behavior: Behavior normal.        Thought Content: Thought content normal.        Judgment: Judgment normal.           Assessment & Plan:  Well exam. We discussed diet and exercise. Get fasting labs.  Alysia Penna, MD

## 2019-09-09 NOTE — Patient Instructions (Signed)
Health Maintenance Due  Topic Date Due  . Hepatitis C Screening  07/12/1961  . FOOT EXAM  05/31/1969  . OPHTHALMOLOGY EXAM  05/31/1969  . HIV Screening  05/31/1974  . HEMOGLOBIN A1C  05/18/2018  . URINE MICROALBUMIN  11/18/2018  . INFLUENZA VACCINE  05/30/2019    No flowsheet data found.

## 2019-09-22 ENCOUNTER — Encounter: Payer: Self-pay | Admitting: Family Medicine

## 2019-09-23 ENCOUNTER — Encounter: Payer: Self-pay | Admitting: Family Medicine

## 2019-09-23 DIAGNOSIS — Z20828 Contact with and (suspected) exposure to other viral communicable diseases: Secondary | ICD-10-CM | POA: Diagnosis not present

## 2019-09-23 DIAGNOSIS — U071 COVID-19: Secondary | ICD-10-CM | POA: Diagnosis not present

## 2019-09-28 NOTE — Telephone Encounter (Signed)
Spoke to pt and he is aware that he should be tested 2x after a positive contact. Pt stated that he was told this where he had his intial testing done 11/26. Pt stated the rapid came back neg but he has not heard about the other test that was sent to [facility]. Pt stated that he has an appt. To be retested tomorrow.

## 2019-11-15 ENCOUNTER — Other Ambulatory Visit: Payer: Self-pay | Admitting: Family Medicine

## 2019-11-16 ENCOUNTER — Encounter: Payer: Self-pay | Admitting: Family Medicine

## 2019-11-17 ENCOUNTER — Other Ambulatory Visit: Payer: Self-pay | Admitting: Family Medicine

## 2019-11-17 MED ORDER — AMLODIPINE BESYLATE 5 MG PO TABS: 5.0000 mg | ORAL_TABLET | Freq: Every day | ORAL | 3 refills | Status: DC

## 2019-11-17 NOTE — Addendum Note (Signed)
Addended by: Rebecca Eaton on: 11/17/2019 11:07 AM   Modules accepted: Orders

## 2019-12-07 ENCOUNTER — Other Ambulatory Visit: Payer: Self-pay | Admitting: Family Medicine

## 2019-12-31 ENCOUNTER — Encounter: Payer: Self-pay | Admitting: Family Medicine

## 2020-01-19 ENCOUNTER — Other Ambulatory Visit: Payer: Self-pay | Admitting: Family Medicine

## 2020-03-12 ENCOUNTER — Other Ambulatory Visit: Payer: Self-pay | Admitting: Family Medicine

## 2020-04-25 ENCOUNTER — Other Ambulatory Visit: Payer: Self-pay | Admitting: Family Medicine

## 2020-06-12 ENCOUNTER — Other Ambulatory Visit: Payer: Self-pay | Admitting: Family Medicine

## 2020-07-20 ENCOUNTER — Other Ambulatory Visit: Payer: BC Managed Care – PPO

## 2020-07-20 DIAGNOSIS — Z20822 Contact with and (suspected) exposure to covid-19: Secondary | ICD-10-CM

## 2020-07-22 LAB — NOVEL CORONAVIRUS, NAA: SARS-CoV-2, NAA: NOT DETECTED

## 2020-07-22 LAB — SARS-COV-2, NAA 2 DAY TAT

## 2020-07-31 ENCOUNTER — Encounter: Payer: Self-pay | Admitting: Family Medicine

## 2020-08-01 ENCOUNTER — Other Ambulatory Visit: Payer: Self-pay

## 2020-08-01 DIAGNOSIS — Z20822 Contact with and (suspected) exposure to covid-19: Secondary | ICD-10-CM | POA: Diagnosis not present

## 2020-08-02 LAB — SARS-COV-2, NAA 2 DAY TAT

## 2020-08-02 LAB — NOVEL CORONAVIRUS, NAA: SARS-CoV-2, NAA: DETECTED — AB

## 2020-08-02 NOTE — Telephone Encounter (Signed)
Set up an OV for this

## 2020-08-03 ENCOUNTER — Telehealth (INDEPENDENT_AMBULATORY_CARE_PROVIDER_SITE_OTHER): Payer: BC Managed Care – PPO | Admitting: Family Medicine

## 2020-08-03 ENCOUNTER — Encounter: Payer: Self-pay | Admitting: Family Medicine

## 2020-08-03 ENCOUNTER — Telehealth: Payer: Self-pay | Admitting: Infectious Diseases

## 2020-08-03 VITALS — HR 65

## 2020-08-03 DIAGNOSIS — U071 COVID-19: Secondary | ICD-10-CM

## 2020-08-03 MED ORDER — BENZONATATE 100 MG PO CAPS: 100.0000 mg | ORAL_CAPSULE | Freq: Three times a day (TID) | ORAL | 0 refills | Status: DC | PRN

## 2020-08-03 MED ORDER — METFORMIN HCL 500 MG PO TABS
ORAL_TABLET | ORAL | 0 refills | Status: DC
Start: 2020-08-03 — End: 2020-11-08

## 2020-08-03 NOTE — Progress Notes (Signed)
Patient ID: Christopher Marsh, male   DOB: June 17, 1961, 59 y.o.   MRN: [id number]  This visit type was conducted due to national recommendations for restrictions regarding the COVID-19 pandemic in an effort to limit this patient's exposure and mitigate transmission in our community.   Virtual Visit via Telephone Note  I connected with Christopher Marsh on 08/03/20 at  3:15 PM EDT by telephone and verified that I am speaking with the correct person using two identifiers.   I discussed the limitations, risks, security and privacy concerns of performing an evaluation and management service by telephone and the availability of in person appointments. I also discussed with the patient that there may be a patient responsible charge related to this service. The patient expressed understanding and agreed to proceed.  Location patient: home Location provider: work or home office Participants present for the call: patient, provider Patient did not have a visit in the prior 7 days to address this/these issue(s).   History of Present Illness: Christopher Marsh developed onset of some respiratory symptoms this past Saturday.  He states he had a coworker that was recently positive for Covid.  He started having symptoms of headache and some body aches and congestion and cough.  He went for Covid testing and this came back positive on the fourth.  He states his headache and body aches are actually already improving.  He thinks his fever broke sometime yesterday.  He does have a home pulse oximeter and has had some readings as low as 89% but does not feel dyspneic either at rest or with ambulation within the house.  He does have comorbidities including hypertension, diabetes, obesity  He did receive Pfizer vaccine (both doses) back in the spring.  Denies any nausea, vomiting, or diarrhea.  At this point, his major symptom is the significant cough at night which is interfering with sleep.  He is requesting that a few Metformin  be sent to his pharmacy over in [place] as he normally gets this in [place]  Past Medical History:  Diagnosis Date   Arthritis    Diabetes mellitus without complication ([place])    DVT (deep venous thrombosis) ([place])    Lt leg   Dyspnea    Elevated lipids    Hyperlipidemia    Hypertension    Lower extremity edema    Past Surgical History:  Procedure Laterality Date   COLONOSCOPY WITH PROPOFOL N/A 09/23/2018   Per Dr. Alice Reichert, polyp, repeat in 3 yrs    EYE MUSCLE SURGERY     JOINT REPLACEMENT Left 07/22/2017   TOTAL HIP ARTHROPLASTY Left 07/22/2017   Procedure: TOTAL HIP ARTHROPLASTY;  Surgeon: Dereck Leep, MD;  Location: [facility] ORS;  Service: Orthopedics;  Laterality: Left;    reports that he has quit smoking. He has never used smokeless tobacco. He reports current alcohol use of about 10.0 standard drinks of alcohol per week. He reports that he does not use drugs. family history includes Emphysema in his father. No Known Allergies    Observations/Objective: Patient sounds cheerful and well on the phone. I do not appreciate any SOB. Speech and thought processing are grossly intact. Patient reported vitals:  Assessment and Plan:  COVID-19 infection in a patient with multiple comorbidities including morbid obesity, type 2 diabetes, hypertension.  He has been vaccinated.  He does not appear to be any distress currently but has had some low pulse oximetry readings by his home monitor.  He is currently on day 5 of symptoms  -  We have strongly suggested that he consider monoclonal antibody infusion and he plans to call them back.  They left a message on his machine earlier today.  He will do that soon as we hang up on this call -We sent in some Tessalon pearls 100 mg every 8 hours as needed for cough -Continue close monitoring of pulse oximeter -He knows to stay quarantined for minimum of 10 days from onset of symptoms  Follow Up Instructions:  -for possible MAB  infusion, as above.   99441 5-10 [id number] 11-20 99443 21-30 I did not refer this patient for an OV in the next 24 hours for this/these issue(s).  I discussed the assessment and treatment plan with the patient. The patient was provided an opportunity to ask questions and all were answered. The patient agreed with the plan and demonstrated an understanding of the instructions.   The patient was advised to call back or seek an in-person evaluation if the symptoms worsen or if the condition fails to improve as anticipated.  I provided 13 minutes of non-face-to-face time during this encounter.   Carolann Littler, MD

## 2020-08-03 NOTE — Telephone Encounter (Signed)
Called to Discuss with patient about Covid symptoms and the use of the monoclonal antibody infusion for those with mild to moderate Covid symptoms and at a high risk of hospitalization.     Pt appears to qualify for this infusion due to co-morbid conditions and/or a member of an at-risk group in accordance with the FDA Emergency Use Authorization.    Unable to reach pt - I left him a voicemail and sent a mychart to him.    [name], MSN, NP-C [facility] Infectious Disease [email or web address] Pager: [phone] Office: [phone] [place]: [phone]

## 2020-08-04 ENCOUNTER — Other Ambulatory Visit: Payer: Self-pay | Admitting: Infectious Diseases

## 2020-08-04 DIAGNOSIS — U071 COVID-19: Secondary | ICD-10-CM

## 2020-08-04 DIAGNOSIS — E11649 Type 2 diabetes mellitus with hypoglycemia without coma: Secondary | ICD-10-CM

## 2020-08-04 DIAGNOSIS — I10 Essential (primary) hypertension: Secondary | ICD-10-CM

## 2020-08-04 NOTE — Progress Notes (Signed)
I connected by phone with Christopher Marsh on 08/04/2020 at 9:52 AM to discuss the potential use of a new treatment for mild to moderate COVID-19 viral infection in non-hospitalized patients.  This patient is a 59 y.o. male that meets the FDA criteria for Emergency Use Authorization of COVID monoclonal antibody casirivimab/imdevimab or bamlanivimab/eteseviamb.  Has a (+) direct SARS-CoV-2 viral test result  Has mild or moderate COVID-19   Is NOT hospitalized due to COVID-19  Is within 10 days of symptom onset  Has at least one of the high risk factor(s) for progression to severe COVID-19 and/or hospitalization as defined in EUA.  Specific high risk criteria : BMI > 25, Diabetes and Cardiovascular disease or hypertension   I have spoken and communicated the following to the patient or parent/caregiver regarding COVID monoclonal antibody treatment:  1. FDA has authorized the emergency use for the treatment of mild to moderate COVID-19 in adults and pediatric patients with positive results of direct SARS-CoV-2 viral testing who are 60 years of age and older weighing at least 40 kg, and who are at high risk for progressing to severe COVID-19 and/or hospitalization.  2. The significant known and potential risks and benefits of COVID monoclonal antibody, and the extent to which such potential risks and benefits are unknown.  3. Information on available alternative treatments and the risks and benefits of those alternatives, including clinical trials.  4. Patients treated with COVID monoclonal antibody should continue to self-isolate and use infection control measures (e.g., wear mask, isolate, social distance, avoid sharing personal items, clean and disinfect "high touch" surfaces, and frequent handwashing) according to CDC guidelines.   5. The patient or parent/caregiver has the option to accept or refuse COVID monoclonal antibody treatment.  After reviewing this information with the patient,  the patient has agreed to receive one of the available covid 19 monoclonal antibodies and will be provided an appropriate fact sheet prior to infusion. [name], NP 08/04/2020 9:52 AM

## 2020-08-04 NOTE — Telephone Encounter (Signed)
Mychart received back from patient requesting treatment.

## 2020-08-05 ENCOUNTER — Ambulatory Visit (HOSPITAL_COMMUNITY)
Admission: RE | Admit: 2020-08-05 | Discharge: 2020-08-05 | Disposition: A | Discharge status: Home / Self Care | Payer: BC Managed Care – PPO | Source: Ambulatory Visit | Attending: Pulmonary Disease | Admitting: Pulmonary Disease

## 2020-08-05 DIAGNOSIS — U071 COVID-19: Secondary | ICD-10-CM | POA: Diagnosis not present

## 2020-08-05 DIAGNOSIS — E11649 Type 2 diabetes mellitus with hypoglycemia without coma: Secondary | ICD-10-CM | POA: Diagnosis not present

## 2020-08-05 DIAGNOSIS — I10 Essential (primary) hypertension: Secondary | ICD-10-CM | POA: Diagnosis not present

## 2020-08-05 MED ORDER — FAMOTIDINE IN NACL 20-0.9 MG/50ML-% IV SOLN: 20.0000 mg | Freq: Once | INTRAVENOUS | Status: DC | PRN

## 2020-08-05 MED ORDER — METHYLPREDNISOLONE SODIUM SUCC 125 MG IJ SOLR: 125.0000 mg | Freq: Once | INTRAMUSCULAR | Status: DC | PRN

## 2020-08-05 MED ORDER — SODIUM CHLORIDE 0.9 % IV SOLN: INTRAVENOUS | Status: DC | PRN

## 2020-08-05 MED ORDER — EPINEPHRINE 0.3 MG/0.3ML IJ SOAJ: 0.3000 mg | Freq: Once | INTRAMUSCULAR | Status: DC | PRN

## 2020-08-05 MED ORDER — SODIUM CHLORIDE 0.9 % IV SOLN: Freq: Once | INTRAVENOUS | Status: AC

## 2020-08-05 MED ORDER — ALBUTEROL SULFATE HFA 108 (90 BASE) MCG/ACT IN AERS: 2.0000 | INHALATION_SPRAY | Freq: Once | RESPIRATORY_TRACT | Status: DC | PRN

## 2020-08-05 MED ORDER — DIPHENHYDRAMINE HCL 50 MG/ML IJ SOLN: 50.0000 mg | Freq: Once | INTRAMUSCULAR | Status: DC | PRN

## 2020-08-05 NOTE — Discharge Instructions (Signed)

## 2020-08-05 NOTE — Progress Notes (Signed)
  Diagnosis: COVID-19  Physician:Dr Joya Gaskins  Procedure: Covid Infusion Clinic Med: casirivimab\imdevimab infusion - Provided patient with casirivimab\imdevimab fact sheet for patients, parents and caregivers prior to infusion.  Complications: No immediate complications noted.  Discharge: Discharged home   Heide Scales 08/05/2020

## 2020-08-10 ENCOUNTER — Other Ambulatory Visit: Payer: Self-pay

## 2020-08-10 DIAGNOSIS — Z20822 Contact with and (suspected) exposure to covid-19: Secondary | ICD-10-CM

## 2020-08-11 LAB — SPECIMEN STATUS REPORT

## 2020-08-11 LAB — SARS-COV-2, NAA 2 DAY TAT

## 2020-08-11 LAB — NOVEL CORONAVIRUS, NAA: SARS-CoV-2, NAA: NOT DETECTED

## 2020-08-13 ENCOUNTER — Encounter: Payer: Self-pay | Admitting: Family Medicine

## 2020-08-14 ENCOUNTER — Ambulatory Visit
Admission: EM | Admit: 2020-08-14 | Discharge: 2020-08-14 | Disposition: A | Discharge status: Home / Self Care | Payer: BC Managed Care – PPO | Attending: Orthopedic Surgery | Admitting: Orthopedic Surgery

## 2020-08-14 ENCOUNTER — Encounter: Payer: Self-pay | Admitting: Emergency Medicine

## 2020-08-14 ENCOUNTER — Other Ambulatory Visit: Payer: Self-pay

## 2020-08-14 ENCOUNTER — Ambulatory Visit: Payer: BC Managed Care – PPO

## 2020-08-14 DIAGNOSIS — J1282 Pneumonia due to coronavirus disease 2019: Secondary | ICD-10-CM

## 2020-08-14 DIAGNOSIS — R059 Cough, unspecified: Secondary | ICD-10-CM

## 2020-08-14 DIAGNOSIS — U071 COVID-19: Secondary | ICD-10-CM | POA: Diagnosis not present

## 2020-08-14 MED ORDER — PREDNISONE 20 MG PO TABS: 40.0000 mg | ORAL_TABLET | Freq: Every day | ORAL | 0 refills | Status: DC

## 2020-08-14 MED ORDER — ALBUTEROL SULFATE HFA 108 (90 BASE) MCG/ACT IN AERS: 1.0000 | INHALATION_SPRAY | Freq: Four times a day (QID) | RESPIRATORY_TRACT | 0 refills | Status: DC | PRN

## 2020-08-14 MED ORDER — AZITHROMYCIN 250 MG PO TABS: 250.0000 mg | ORAL_TABLET | Freq: Every day | ORAL | 0 refills | Status: DC

## 2020-08-14 NOTE — ED Provider Notes (Signed)
[facility]    CSN: [id number] Arrival date & time: 08/14/20  0825      History   Chief Complaint Chief Complaint  Patient presents with  . Cough    HPI Christopher Marsh is a 59 y.o. male presents to the urgent care facility for evaluation of worsening cough.  Patient developed Covid 15 days ago.  Several days later had positive Covid test.  He received monoclonal antibody therapy is seen to be doing well but over the last couple days has noticed worsening productive cough.  He denies any chest pain.  Does not feel short of breath.  Has been monitoring O2 sats at home and they have been staying around 95% on room air.  He does not feel dizzy or lightheaded.  He has had some chills but no noted fevers.  He is not taking any Tylenol or ibuprofen.  He has been taking Best boy.  HPI  Past Medical History:  Diagnosis Date  . Arthritis   . Diabetes mellitus without complication ([place])   . DVT (deep venous thrombosis) ([facility])    Lt leg  . Dyspnea   . Elevated lipids   . Hyperlipidemia   . Hypertension   . Lower extremity edema     Patient Active Problem List   Diagnosis Date Noted  . Status post total replacement of hip 07/22/2017  . Essential hypertension 08/28/2016  . Actinic keratosis 11/30/2015  . Diabetes type 2, uncontrolled ([place]) 02/15/2015  . Morbid obesity ([place]) 06/18/2013  . Left hip pain 04/02/2012  . HSV (herpes simplex virus) infection 04/02/2012  . CONTACT DERMATITIS&OTHER ECZEMA DUE UNSPEC CAUSE 09/14/2009  . ERECTILE DYSFUNCTION 12/08/2008  . Venous (peripheral) insufficiency 12/08/2008  . HEPATITIS C, HX OF 12/08/2008  . Hyperlipemia 11/24/2008  . ESOPHAGITIS, REFLUX 11/18/2008    Past Surgical History:  Procedure Laterality Date  . COLONOSCOPY WITH PROPOFOL N/A 09/23/2018   Per Dr. Alice Reichert, polyp, repeat in 3 yrs   . EYE MUSCLE SURGERY    . JOINT REPLACEMENT Left 07/22/2017  . TOTAL HIP ARTHROPLASTY Left 07/22/2017   Procedure: TOTAL  HIP ARTHROPLASTY;  Surgeon: Dereck Leep, MD;  Location: [facility] ORS;  Service: Orthopedics;  Laterality: Left;       Home Medications    Prior to Admission medications   Medication Sig Start Date End Date Taking? Authorizing Provider  acyclovir (ZOVIRAX) 400 MG tablet Take 1 tablet (400 mg total) by mouth 3 (three) times daily as needed. 11/18/17  Yes Dorena Cookey, MD  amLODipine (NORVASC) 5 MG tablet Take 1 tablet (5 mg total) by mouth daily. 11/17/19  Yes Laurey Morale, MD  benzonatate (TESSALON) 100 MG capsule Take 1 capsule (100 mg total) by mouth 3 (three) times daily as needed for cough. 08/03/20  Yes Burchette, Alinda Sierras, MD  glucose blood test strip 1 each by Other route as needed for other. accu chek aviva plusUse as instructed   Yes [provider]  ketoconazole (NIZORAL) 2 % cream Apply 1 application topically 2 (two) times daily as needed for irritation. 09/09/19  Yes Laurey Morale, MD  metFORMIN (GLUCOPHAGE) 500 MG tablet Take once each morning 08/03/20  Yes Burchette, Alinda Sierras, MD  pravastatin (PRAVACHOL) 20 MG tablet TAKE 1 TABLET ONCE DAILY. 06/13/20  Yes Laurey Morale, MD  tadalafil (CIALIS) 20 MG tablet Take 0.5-1 tablets (10-20 mg total) by mouth every other day as needed for erectile dysfunction. 09/09/19  Yes Laurey Morale,  MD  triamcinolone ointment (KENALOG) 0.1 % APPLY TO AFFECTED AREA TWICE A DAY. 04/25/20  Yes Laurey Morale, MD  albuterol (VENTOLIN HFA) 108 (90 Base) MCG/ACT inhaler Inhale 1-2 puffs into the lungs every 6 (six) hours as needed for wheezing or shortness of breath. 08/14/20   Duanne Guess, PA-C  azithromycin (ZITHROMAX) 250 MG tablet Take 1 tablet (250 mg total) by mouth daily. Take first 2 tablets together, then 1 every day until finished. 08/14/20   Duanne Guess, PA-C  predniSONE (DELTASONE) 20 MG tablet Take 2 tablets (40 mg total) by mouth daily. 08/14/20   Duanne Guess, PA-C    Family History Family History  Problem Relation  Age of Onset  . Emphysema Father     Social History Social History   Tobacco Use  . Smoking status: Former Research scientist (life sciences)  . Smokeless tobacco: Never Used  Vaping Use  . Vaping Use: Never used  Substance Use Topics  . Alcohol use: Yes    Alcohol/week: 10.0 standard drinks    Types: 10 Cans of beer per week  . Drug use: No     Allergies   Patient has no known allergies.   Review of Systems Review of Systems  Constitutional: Positive for chills. Negative for fever.  Respiratory: Positive for cough. Negative for chest tightness, shortness of breath, wheezing and stridor.   Cardiovascular: Negative for chest pain and leg swelling.  Gastrointestinal: Negative for abdominal pain, nausea and vomiting.  Skin: Negative for rash.  Neurological: Negative for headaches.     Physical Exam Triage Vital Signs ED Triage Vitals  Enc Vitals Group     BP 08/14/20 0842 135/80     Pulse Rate 08/14/20 0842 84     Resp 08/14/20 0842 20     Temp 08/14/20 0842 98.3 F (36.8 C)     Temp Source 08/14/20 0842 Oral     SpO2 08/14/20 0842 92 %     Weight 08/14/20 0838 (!) 313 lb 0.9 oz (142 kg)     Height 08/14/20 0838 6' 2.5" (1.892 m)     Head Circumference --      Peak Flow --      Pain Score 08/14/20 0838 0     Pain Loc --      Pain Edu? --      Excl. in [place]? --    No data found.  Updated Vital Signs BP 135/80 (BP Location: Left Arm)   Pulse 84   Temp 98.3 F (36.8 C) (Oral)   Resp 20   Ht 6' 2.5" (1.892 m)   Wt (!) 313 lb 0.9 oz (142 kg)   SpO2 94% Comment: with ambulation  BMI 39.66 kg/m   Visual Acuity Right Eye Distance:   Left Eye Distance:   Bilateral Distance:    Right Eye Near:   Left Eye Near:    Bilateral Near:     Physical Exam Constitutional:      Appearance: Normal appearance. He is well-developed.  HENT:     Head: Normocephalic and atraumatic.     Mouth/Throat:     Pharynx: No oropharyngeal exudate or posterior oropharyngeal erythema.  Eyes:      Conjunctiva/sclera: Conjunctivae normal.  Cardiovascular:     Rate and Rhythm: Normal rate.  Pulmonary:     Effort: Pulmonary effort is normal. No respiratory distress.     Breath sounds: Rales present. No wheezing.  Abdominal:     General: There is no  distension.     Tenderness: There is no abdominal tenderness.  Musculoskeletal:        General: Normal range of motion.     Cervical back: Normal range of motion.  Skin:    General: Skin is warm.     Findings: No rash.  Neurological:     General: No focal deficit present.     Mental Status: He is alert and oriented to person, place, and time.  Psychiatric:        Behavior: Behavior normal.        Thought Content: Thought content normal.      UC Treatments / Results  Labs (all labs ordered are listed, but only abnormal results are displayed) Labs Reviewed - No data to display  EKG   Radiology DG Chest 2 View  Result Date: 08/14/2020 CLINICAL DATA:  59 year old male with history of shortness of breath and cough. Recent COVID-19 positive test. EXAM: CHEST - 2 VIEW COMPARISON:  06/25/2018 and chest CT from 09/30/2018 FINDINGS: The heart size and mediastinal contours are within normal limits. Multifocal bilateral, lower lobe and peripherally predominant patchy pulmonary opacities, most prominent the right lower lobe. No pleural effusion or pneumothorax. The visualized skeletal structures are unremarkable. IMPRESSION: Multifocal bilateral, peripheral and basal predominant pulmonary opacities, compatible with multifocal pneumonia associated with COVID-19. These results will be called to the ordering clinician or representative by the Radiologist Assistant, and communication documented in the PACS or [employer]. Electronically Signed   By: Ruthann Cancer MD   On: 08/14/2020 09:13    Procedures Procedures (including critical care time)  Medications Ordered in UC Medications - No data to display  Initial Impression / Assessment  and Plan / UC Course  I have reviewed the triage vital signs and the nursing notes.  Pertinent labs & imaging results that were available during my care of the patient were reviewed by me and considered in my medical decision making (see chart for details).     59 year old male with COVID-19 2 weeks ago.  Developed productive cough recently has a little bit of chills.  Chest x-ray show multifocal bilateral opacities consistent with COVID-19.  Patient placed on prednisone, albuterol inhaler and given a Z-Pak.  He was able to ambulate 2 laps around the clinic, approximately 200 feet and maintain O2 sats at 94%.  He has a pulse ox at home and will continue to monitor his O2 saturations.  His vital signs are stable.  He appears well and states he feels fine except for a bad cough.  He understands signs and symptoms to return to the ER for Final Clinical Impressions(s) / UC Diagnoses   Final diagnoses:  Cough  Pneumonia due to COVID-19 virus     Discharge Instructions     Please take medications as prescribed.  Continue to monitor your O2 saturations.  If any O2 saturations dropping below 90% or any shortness of breath, worsening cough or difficulty breathing, please go to the ER.    ED Prescriptions    Medication Sig Dispense Auth. Provider   predniSONE (DELTASONE) 20 MG tablet Take 2 tablets (40 mg total) by mouth daily. 10 tablet Duanne Guess, PA-C   albuterol (VENTOLIN HFA) 108 (90 Base) MCG/ACT inhaler Inhale 1-2 puffs into the lungs every 6 (six) hours as needed for wheezing or shortness of breath. 18 g Duanne Guess, PA-C   azithromycin (ZITHROMAX) 250 MG tablet Take 1 tablet (250 mg total) by mouth daily. Take first  2 tablets together, then 1 every day until finished. 6 tablet Duanne Guess, PA-C     PDMP not reviewed this encounter.   Duanne Guess, PA-C 08/14/20 1011

## 2020-08-14 NOTE — ED Triage Notes (Signed)
Pt c/o cough, chest congestion, sweats and shortness of breath. He tested positive for covid about 13 days ago. he has h/o pneumonia.

## 2020-08-14 NOTE — Discharge Instructions (Addendum)
Please take medications as prescribed.  Continue to monitor your O2 saturations.  If any O2 saturations dropping below 90% or any shortness of breath, worsening cough or difficulty breathing, please go to the ER.

## 2020-08-15 ENCOUNTER — Encounter: Payer: Self-pay | Admitting: Family Medicine

## 2020-08-15 NOTE — Telephone Encounter (Signed)
He went to urgent care for this

## 2020-08-15 NOTE — Telephone Encounter (Signed)
Noted  

## 2020-08-31 ENCOUNTER — Encounter: Payer: Self-pay | Admitting: Family Medicine

## 2020-09-01 ENCOUNTER — Encounter: Payer: Self-pay | Admitting: Family Medicine

## 2020-09-01 ENCOUNTER — Telehealth (INDEPENDENT_AMBULATORY_CARE_PROVIDER_SITE_OTHER): Payer: BC Managed Care – PPO | Admitting: Family Medicine

## 2020-09-01 DIAGNOSIS — U071 COVID-19: Secondary | ICD-10-CM | POA: Diagnosis not present

## 2020-09-01 MED ORDER — FLOVENT HFA 110 MCG/ACT IN AERO: 2.0000 | INHALATION_SPRAY | Freq: Two times a day (BID) | RESPIRATORY_TRACT | 1 refills | Status: DC

## 2020-09-01 NOTE — Progress Notes (Signed)
Subjective:    Patient ID: Christopher Marsh, male    DOB: July 26, 1961, 59 y.o.   MRN: [id number]  HPI Virtual Visit via Video Note  I connected with the patient on 09/01/20 at  8:15 AM EDT by a video enabled telemedicine application and verified that I am speaking with the correct person using two identifiers.  Location patient: home Location provider:work or home office Persons participating in the virtual visit: patient, provider  I discussed the limitations of evaluation and management by telemedicine and the availability of in person appointments. The patient expressed understanding and agreed to proceed.   HPI: Here for a lingering cough after a Covid-19 infection. He tested positive for the virus on 08-01-20, and he went to urgent care on 08-14-20. That day his CXR showed bilateral opacities typical of Covid pneumonia. His oxygen sats were good. He was treated with a Zpack, Prednisone, and an albuterol inhaler. He got over the worst of it, but now he still has a lingering cough which produces clear sputu,. He feels congestion in the chest, but there is no chest pain or SOB or fever. He uses albuterol 2-3 times dail, and each time it helps.    ROS: See pertinent positives and negatives per HPI.  Past Medical History:  Diagnosis Date  . Arthritis   . Diabetes mellitus without complication ([place])   . DVT (deep venous thrombosis) ([facility])    Lt leg  . Dyspnea   . Elevated lipids   . Hyperlipidemia   . Hypertension   . Lower extremity edema     Past Surgical History:  Procedure Laterality Date  . COLONOSCOPY WITH PROPOFOL N/A 09/23/2018   Per Dr. Alice Reichert, polyp, repeat in 3 yrs   . EYE MUSCLE SURGERY    . JOINT REPLACEMENT Left 07/22/2017  . TOTAL HIP ARTHROPLASTY Left 07/22/2017   Procedure: TOTAL HIP ARTHROPLASTY;  Surgeon: Dereck Leep, MD;  Location: [facility] ORS;  Service: Orthopedics;  Laterality: Left;    Family History  Problem Relation Age of Onset  . Emphysema Father       Current Outpatient Medications:  .  acyclovir (ZOVIRAX) 400 MG tablet, Take 1 tablet (400 mg total) by mouth 3 (three) times daily as needed., Disp: 90 tablet, Rfl: 1 .  albuterol (VENTOLIN HFA) 108 (90 Base) MCG/ACT inhaler, Inhale 1-2 puffs into the lungs every 6 (six) hours as needed for wheezing or shortness of breath., Disp: 18 g, Rfl: 0 .  amLODipine (NORVASC) 5 MG tablet, Take 1 tablet (5 mg total) by mouth daily., Disp: 90 tablet, Rfl: 3 .  amoxicillin (AMOXIL) 500 MG tablet, Take 500 mg by mouth 3 (three) times daily., Disp: , Rfl:  .  benzonatate (TESSALON) 100 MG capsule, Take 1 capsule (100 mg total) by mouth 3 (three) times daily as needed for cough., Disp: 30 capsule, Rfl: 0 .  glucose blood test strip, 1 each by Other route as needed for other. accu chek aviva plusUse as instructed, Disp: , Rfl:  .  ketoconazole (NIZORAL) 2 % cream, Apply 1 application topically 2 (two) times daily as needed for irritation., Disp: 30 g, Rfl: 2 .  metFORMIN (GLUCOPHAGE) 500 MG tablet, Take once each morning, Disp: 10 tablet, Rfl: 0 .  pravastatin (PRAVACHOL) 20 MG tablet, TAKE 1 TABLET ONCE DAILY., Disp: 90 tablet, Rfl: 0 .  tadalafil (CIALIS) 20 MG tablet, Take 0.5-1 tablets (10-20 mg total) by mouth every other day as needed for erectile dysfunction., Disp: 20  tablet, Rfl: 11 .  triamcinolone ointment (KENALOG) 0.1 %, APPLY TO AFFECTED AREA TWICE A DAY., Disp: 80 g, Rfl: 0 .  fluticasone (FLOVENT HFA) 110 MCG/ACT inhaler, Inhale 2 puffs into the lungs in the morning and at bedtime., Disp: 12 g, Rfl: 1  EXAM:  VITALS per patient if applicable:  GENERAL: alert, oriented, appears well and in no acute distress  HEENT: atraumatic, conjunttiva clear, no obvious abnormalities on inspection of external nose and ears  NECK: normal movements of the head and neck  LUNGS: on inspection no signs of respiratory distress, breathing rate appears normal, no obvious gross SOB, gasping or wheezing  CV:  no obvious cyanosis  MS: moves all visible extremities without noticeable abnormality  PSYCH/NEURO: pleasant and cooperative, no obvious depression or anxiety, speech and thought processing grossly intact  ASSESSMENT AND PLAN: He is recovering from Covid pneumonia and has a lingering cough from airway inflammation. We will add Flovent 2 puffs BID and he will take 1200 mg of Mucinex BID. Recheck as needed.  Alysia Penna, MD  Discussed the following assessment and plan:  No diagnosis found.     I discussed the assessment and treatment plan with the patient. The patient was provided an opportunity to ask questions and all were answered. The patient agreed with the plan and demonstrated an understanding of the instructions.   The patient was advised to call back or seek an in-person evaluation if the symptoms worsen or if the condition fails to improve as anticipated.     Review of Systems     Objective:   Physical Exam        Assessment & Plan:

## 2020-09-15 ENCOUNTER — Other Ambulatory Visit: Payer: Self-pay | Admitting: Family Medicine

## 2020-09-29 ENCOUNTER — Other Ambulatory Visit: Payer: Self-pay | Admitting: Family Medicine

## 2020-10-31 ENCOUNTER — Encounter: Payer: Self-pay | Admitting: Family Medicine

## 2020-11-02 ENCOUNTER — Encounter: Payer: Self-pay | Admitting: Family Medicine

## 2020-11-02 NOTE — Telephone Encounter (Signed)
Yes next week would be perfect

## 2020-11-06 ENCOUNTER — Other Ambulatory Visit: Payer: Self-pay | Admitting: Family Medicine

## 2020-11-07 NOTE — Telephone Encounter (Signed)
Last video visit-  09/01/2020 Last refill-- 08/03/2020--10 tabs no refill

## 2020-11-27 ENCOUNTER — Other Ambulatory Visit: Payer: Self-pay | Admitting: Family Medicine

## 2020-12-11 ENCOUNTER — Other Ambulatory Visit: Payer: Self-pay | Admitting: Family Medicine

## 2020-12-13 NOTE — Telephone Encounter (Signed)
Last video visit- 09/01/2020 Last refill-09/17/2020---90 tabs no refills  Last labs--09/09/19  Can this patient receive a refill?

## 2021-02-26 ENCOUNTER — Other Ambulatory Visit: Payer: Self-pay | Admitting: Family Medicine

## 2021-05-10 ENCOUNTER — Ambulatory Visit (INDEPENDENT_AMBULATORY_CARE_PROVIDER_SITE_OTHER)
Admission: RE | Admit: 2021-05-10 | Discharge: 2021-05-10 | Disposition: A | Discharge status: Home / Self Care | Payer: BC Managed Care – PPO | Source: Ambulatory Visit | Attending: Family Medicine | Admitting: Family Medicine

## 2021-05-10 ENCOUNTER — Other Ambulatory Visit: Payer: Self-pay

## 2021-05-10 ENCOUNTER — Ambulatory Visit: Payer: BC Managed Care – PPO | Admitting: Family Medicine

## 2021-05-10 ENCOUNTER — Encounter: Payer: Self-pay | Admitting: Family Medicine

## 2021-05-10 VITALS — BP 152/64 | HR 85 | Temp 98.8°F | Wt 309.0 lb

## 2021-05-10 DIAGNOSIS — L989 Disorder of the skin and subcutaneous tissue, unspecified: Secondary | ICD-10-CM | POA: Diagnosis not present

## 2021-05-10 DIAGNOSIS — R053 Chronic cough: Secondary | ICD-10-CM | POA: Diagnosis not present

## 2021-05-10 DIAGNOSIS — R059 Cough, unspecified: Secondary | ICD-10-CM | POA: Diagnosis not present

## 2021-05-10 NOTE — Progress Notes (Signed)
   Subjective:    Patient ID: Christopher Marsh, male    DOB: Aug 14, 1961, 60 y.o.   MRN: [id number]  HPI Here for 2 issues. First he has had a spot on his back for several years, and his wife says it has grown larger and has changed colors. It itches but is not painful. Also he has had a cough ever since he had Covid-19 pneumonia last October. A CXR at that time showed a multifocal pneumonia. He was treated and he felt better, but he has had a frequent cough ever since. This comes and goes but is present almost every day. He has some mild SOB at times. No chest pain or fever. He is not using his inhaler, but he does take Mucinex BID.    Review of Systems  Constitutional: Negative.   Respiratory:  Positive for cough and shortness of breath. Negative for choking and wheezing.   Cardiovascular: Negative.   Skin:  Positive for color change.      Objective:   Physical Exam Constitutional:      Appearance: Normal appearance.  Cardiovascular:     Rate and Rhythm: Normal rate and regular rhythm.     Pulses: Normal pulses.     Heart sounds: Normal heart sounds.  Pulmonary:     Effort: Pulmonary effort is normal.     Breath sounds: Normal breath sounds.  Musculoskeletal:     Right lower leg: No edema.     Left lower leg: No edema.  Skin:    Comments: There is a lesion on the left upper back which measures 2 cm X 1.5 cm. This is well marginated. It is slightly raised and red. It is scaly and there are also 2 nodular lesions in the middle of it   Neurological:     Mental Status: He is alert.          Assessment & Plan:  He has a suspicious skin lesion on the back, so we will refer him to the [facility] to have this removed. As for the cough, this seems to be a residual effect of the Covid infection. We will send him for a CXR today. We spent 35 minutes reviewing records and discussing these issues.   Alysia Penna, MD

## 2021-05-17 ENCOUNTER — Encounter: Payer: Self-pay | Admitting: Family Medicine

## 2021-05-25 ENCOUNTER — Encounter: Payer: BC Managed Care – PPO | Admitting: Family Medicine

## 2021-05-25 DIAGNOSIS — D485 Neoplasm of uncertain behavior of skin: Secondary | ICD-10-CM | POA: Diagnosis not present

## 2021-05-25 DIAGNOSIS — L905 Scar conditions and fibrosis of skin: Secondary | ICD-10-CM | POA: Diagnosis not present

## 2021-05-25 DIAGNOSIS — C44529 Squamous cell carcinoma of skin of other part of trunk: Secondary | ICD-10-CM | POA: Diagnosis not present

## 2021-05-25 DIAGNOSIS — Z85828 Personal history of other malignant neoplasm of skin: Secondary | ICD-10-CM | POA: Diagnosis not present

## 2021-05-25 DIAGNOSIS — L570 Actinic keratosis: Secondary | ICD-10-CM | POA: Diagnosis not present

## 2021-05-25 DIAGNOSIS — B078 Other viral warts: Secondary | ICD-10-CM | POA: Diagnosis not present

## 2021-06-04 ENCOUNTER — Other Ambulatory Visit: Payer: Self-pay | Admitting: Family Medicine

## 2021-06-06 ENCOUNTER — Other Ambulatory Visit: Payer: Self-pay

## 2021-06-06 ENCOUNTER — Encounter: Payer: Self-pay | Admitting: Family Medicine

## 2021-06-06 ENCOUNTER — Telehealth: Payer: Self-pay

## 2021-06-06 ENCOUNTER — Ambulatory Visit (INDEPENDENT_AMBULATORY_CARE_PROVIDER_SITE_OTHER): Payer: BC Managed Care – PPO | Admitting: Family Medicine

## 2021-06-06 VITALS — BP 136/70 | HR 85 | Temp 98.6°F | Ht 74.0 in | Wt 306.0 lb

## 2021-06-06 DIAGNOSIS — Z125 Encounter for screening for malignant neoplasm of prostate: Secondary | ICD-10-CM | POA: Diagnosis not present

## 2021-06-06 DIAGNOSIS — Z0000 Encounter for general adult medical examination without abnormal findings: Secondary | ICD-10-CM

## 2021-06-06 LAB — HEPATIC FUNCTION PANEL
ALT: 27 U/L (ref 0–53)
AST: 20 U/L (ref 0–37)
Albumin: 4.2 g/dL (ref 3.5–5.2)
Alkaline Phosphatase: 56 U/L (ref 39–117)
Bilirubin, Direct: 0.2 mg/dL (ref 0.0–0.3)
Total Bilirubin: 0.9 mg/dL (ref 0.2–1.2)
Total Protein: 6.5 g/dL (ref 6.0–8.3)

## 2021-06-06 LAB — T3, FREE: T3, Free: 3.4 pg/mL (ref 2.3–4.2)

## 2021-06-06 LAB — CBC WITH DIFFERENTIAL/PLATELET
Basophils Absolute: 0.1 10*3/uL (ref 0.0–0.1)
Basophils Relative: 1 % (ref 0.0–3.0)
Eosinophils Absolute: 0 10*3/uL (ref 0.0–0.7)
Eosinophils Relative: 0.8 % (ref 0.0–5.0)
HCT: 50.7 % (ref 39.0–52.0)
Hemoglobin: 16.9 g/dL (ref 13.0–17.0)
Lymphocytes Relative: 10.7 % — ABNORMAL LOW (ref 12.0–46.0)
Lymphs Abs: 0.6 10*3/uL — ABNORMAL LOW (ref 0.7–4.0)
MCHC: 33.4 g/dL (ref 30.0–36.0)
MCV: 94.3 fl (ref 78.0–100.0)
Monocytes Absolute: 0.6 10*3/uL (ref 0.1–1.0)
Monocytes Relative: 10.8 % (ref 3.0–12.0)
Neutro Abs: 3.9 10*3/uL (ref 1.4–7.7)
Neutrophils Relative %: 76.7 % (ref 43.0–77.0)
Platelets: 231 10*3/uL (ref 150.0–400.0)
RBC: 5.38 Mil/uL (ref 4.22–5.81)
RDW: 13.7 % (ref 11.5–15.5)
WBC: 5.1 10*3/uL (ref 4.0–10.5)

## 2021-06-06 LAB — BASIC METABOLIC PANEL
BUN: 8 mg/dL (ref 6–23)
CO2: 30 mEq/L (ref 19–32)
Calcium: 9.2 mg/dL (ref 8.4–10.5)
Chloride: 99 mEq/L (ref 96–112)
Creatinine, Ser: 0.84 mg/dL (ref 0.40–1.50)
GFR: 93.79 mL/min (ref >60.00)
Glucose, Bld: 152 mg/dL — ABNORMAL HIGH (ref 70–99)
Potassium: 4.8 mEq/L (ref 3.5–5.1)
Sodium: 137 mEq/L (ref 135–145)

## 2021-06-06 LAB — LIPID PANEL
Cholesterol: 176 mg/dL (ref 0–200)
HDL: 70.8 mg/dL (ref >39.00)
LDL Cholesterol: 91 mg/dL (ref 0–99)
NonHDL: 105.19
Total CHOL/HDL Ratio: 2
Triglycerides: 69 mg/dL (ref 0.0–149.0)
VLDL: 13.8 mg/dL (ref 0.0–40.0)

## 2021-06-06 LAB — PSA: PSA: 1.98 ng/mL (ref 0.10–4.00)

## 2021-06-06 LAB — T4, FREE: Free T4: 0.84 ng/dL (ref 0.60–1.60)

## 2021-06-06 LAB — HEMOGLOBIN A1C: Hgb A1c MFr Bld: 7.3 % — ABNORMAL HIGH (ref 4.6–6.5)

## 2021-06-06 LAB — TSH: TSH: 1.81 u[IU]/mL (ref 0.35–5.50)

## 2021-06-06 MED ORDER — ACYCLOVIR 400 MG PO TABS
400.0000 mg | ORAL_TABLET | Freq: Three times a day (TID) | ORAL | 3 refills | Status: DC | PRN
Start: 2021-06-06 — End: 2021-08-28

## 2021-06-06 MED ORDER — TADALAFIL 20 MG PO TABS: 10.0000 mg | ORAL_TABLET | ORAL | 11 refills | Status: DC | PRN

## 2021-06-06 NOTE — Progress Notes (Signed)
Subjective:    Patient ID: Christopher Marsh, male    DOB: 09/10/1961, 60 y.o.   MRN: [id number]  HPI Here for a well exam. He feels well. He is scheduled to have a squamous cell cancer removed from his back by Dr. Winifred Olive on 06-19-21. He had a colonoscopy done at [facility] on 09-23-18 where 6 adenmatous polyps were removed. A 3 year follow up was recommended. Marlou Sa wishes to have this done here in [place].    Review of Systems  Constitutional: Negative.   HENT: Negative.    Eyes: Negative.   Respiratory: Negative.    Cardiovascular: Negative.   Gastrointestinal: Negative.   Genitourinary: Negative.   Musculoskeletal: Negative.   Skin: Negative.   Neurological: Negative.   Psychiatric/Behavioral: Negative.        Objective:   Physical Exam Constitutional:      General: He is not in acute distress.    Appearance: He is well-developed. He is obese. He is not diaphoretic.  HENT:     Head: Normocephalic and atraumatic.     Right Ear: External ear normal.     Left Ear: External ear normal.     Nose: Nose normal.     Mouth/Throat:     Pharynx: No oropharyngeal exudate.  Eyes:     General: No scleral icterus.       Right eye: No discharge.        Left eye: No discharge.     Conjunctiva/sclera: Conjunctivae normal.     Pupils: Pupils are equal, round, and reactive to light.  Neck:     Thyroid: No thyromegaly.     Vascular: No JVD.     Trachea: No tracheal deviation.  Cardiovascular:     Rate and Rhythm: Normal rate and regular rhythm.     Heart sounds: Normal heart sounds. No murmur heard.   No friction rub. No gallop.  Pulmonary:     Effort: Pulmonary effort is normal. No respiratory distress.     Breath sounds: Normal breath sounds. No wheezing or rales.  Chest:     Chest wall: No tenderness.  Abdominal:     General: Bowel sounds are normal. There is no distension.     Palpations: Abdomen is soft. There is no mass.     Tenderness: There is no abdominal tenderness.  There is no guarding or rebound.  Genitourinary:    Penis: Normal. No tenderness.      Testes: Normal.     Prostate: Normal.     Rectum: Normal. Guaiac result negative.  Musculoskeletal:        General: No tenderness. Normal range of motion.     Cervical back: Neck supple.  Lymphadenopathy:     Cervical: No cervical adenopathy.  Skin:    General: Skin is warm and dry.     Coloration: Skin is not pale.     Findings: No erythema or rash.     Comments: There is a 1 cm X 3 cm slightly raised pink lesion with irregular borders on the left upper back   Neurological:     Mental Status: He is alert and oriented to person, place, and time.     Cranial Nerves: No cranial nerve deficit.     Motor: No abnormal muscle tone.     Coordination: Coordination normal.     Deep Tendon Reflexes: Reflexes are normal and symmetric. Reflexes normal.  Psychiatric:        Behavior: Behavior normal.  Thought Content: Thought content normal.        Judgment: Judgment normal.          Assessment & Plan:  Well exam. We discussed diet and exercise. Get fasting labs. He will have the skin cancer removed as above. We will refer him to [facility] to set up another colonoscopy.  Alysia Penna, MD

## 2021-06-06 NOTE — Telephone Encounter (Signed)
Pt forgot his Rx for Cialis at the office after his visit, called pt and pt requested for the Rx be mailed to his home Address. Rx mailed out on 06/06/2021

## 2021-06-07 ENCOUNTER — Telehealth: Payer: Self-pay | Admitting: Family Medicine

## 2021-06-07 MED ORDER — METFORMIN HCL 500 MG PO TABS: ORAL_TABLET | ORAL | 3 refills | Status: DC

## 2021-06-07 MED ORDER — GLIPIZIDE 10 MG PO TABS: 10.0000 mg | ORAL_TABLET | Freq: Two times a day (BID) | ORAL | 3 refills | Status: DC

## 2021-06-07 NOTE — Telephone Encounter (Signed)
PT called back for lab results

## 2021-06-07 NOTE — Addendum Note (Signed)
Addended by: Nilda Riggs on: 06/07/2021 10:58 AM   Modules accepted: Orders

## 2021-06-07 NOTE — Telephone Encounter (Signed)
Please see results note.

## 2021-06-19 DIAGNOSIS — L821 Other seborrheic keratosis: Secondary | ICD-10-CM | POA: Diagnosis not present

## 2021-06-19 DIAGNOSIS — D229 Melanocytic nevi, unspecified: Secondary | ICD-10-CM | POA: Diagnosis not present

## 2021-06-19 DIAGNOSIS — I781 Nevus, non-neoplastic: Secondary | ICD-10-CM | POA: Diagnosis not present

## 2021-06-19 DIAGNOSIS — C44529 Squamous cell carcinoma of skin of other part of trunk: Secondary | ICD-10-CM | POA: Diagnosis not present

## 2021-08-28 ENCOUNTER — Ambulatory Visit
Admission: EM | Admit: 2021-08-28 | Discharge: 2021-08-28 | Disposition: A | Discharge status: Home / Self Care | Payer: BC Managed Care – PPO | Attending: Emergency Medicine | Admitting: Emergency Medicine

## 2021-08-28 ENCOUNTER — Encounter: Payer: Self-pay | Admitting: Family Medicine

## 2021-08-28 ENCOUNTER — Other Ambulatory Visit: Payer: Self-pay

## 2021-08-28 ENCOUNTER — Other Ambulatory Visit: Payer: Self-pay | Admitting: Family Medicine

## 2021-08-28 ENCOUNTER — Ambulatory Visit (INDEPENDENT_AMBULATORY_CARE_PROVIDER_SITE_OTHER): Payer: BC Managed Care – PPO

## 2021-08-28 DIAGNOSIS — R0782 Intercostal pain: Secondary | ICD-10-CM

## 2021-08-28 DIAGNOSIS — J189 Pneumonia, unspecified organism: Secondary | ICD-10-CM | POA: Diagnosis not present

## 2021-08-28 DIAGNOSIS — R059 Cough, unspecified: Secondary | ICD-10-CM

## 2021-08-28 MED ORDER — ALBUTEROL SULFATE HFA 108 (90 BASE) MCG/ACT IN AERS: 2.0000 | INHALATION_SPRAY | RESPIRATORY_TRACT | 0 refills | Status: DC | PRN

## 2021-08-28 MED ORDER — PROMETHAZINE-DM 6.25-15 MG/5ML PO SYRP: 5.0000 mL | ORAL_SOLUTION | Freq: Four times a day (QID) | ORAL | 0 refills | Status: DC | PRN

## 2021-08-28 MED ORDER — AMOXICILLIN-POT CLAVULANATE 875-125 MG PO TABS: 1.0000 | ORAL_TABLET | Freq: Two times a day (BID) | ORAL | 0 refills | Status: AC

## 2021-08-28 MED ORDER — ALBUTEROL SULFATE HFA 108 (90 BASE) MCG/ACT IN AERS: 1.0000 | INHALATION_SPRAY | Freq: Four times a day (QID) | RESPIRATORY_TRACT | 0 refills | Status: DC | PRN

## 2021-08-28 MED ORDER — AZITHROMYCIN 250 MG PO TABS: 250.0000 mg | ORAL_TABLET | Freq: Every day | ORAL | 0 refills | Status: DC

## 2021-08-28 MED ORDER — BENZONATATE 100 MG PO CAPS: 200.0000 mg | ORAL_CAPSULE | Freq: Three times a day (TID) | ORAL | 0 refills | Status: DC

## 2021-08-28 NOTE — Discharge Instructions (Signed)
Your x-ray today demonstrated bilateral lower lobe pneumonia.  Can treat you with some antibiotics for this: The first antibiotic is Augmentin, take it twice daily with food for 10 days.  The second antibiotic is azithromycin, take 2 tablets a day and then 1 tablet each day after for total of 5 days of treatment.  Use the Tessalon Perles every 8 hours during the day as needed for cough.  Take these with a small sip of water.  You may get some numbness to the base of your tongue or metallic taste in her mouth, this is normal.  Use the Promethazine DM cough syrup as needed for cough at bedtime as will make you drowsy.  Use albuterol inhaler, 2 puffs every 4-6 hours, as needed for shortness of breath and wheezing.  If you develop any worsening shortness of breath please return for reevaluation or go to the emergency department.

## 2021-08-28 NOTE — ED Provider Notes (Signed)
[facility]    CSN: [id number] Arrival date & time: 08/28/21  6720      History   Chief Complaint Chief Complaint  Patient presents with   Cough    HPI AMERY MINASYAN is a 60 y.o. male.   HPI  60 year old male for evaluation of respiratory complaints.  Patient reports that for the last 2 days she has been experiencing an intermittently productive cough for a rusty colored sputum and shortness of breath.  He states he has not had a fever but he has been clammy.  He does endorse mild runny nose and nasal congestion.  His primary complaint at this point is he is having bilateral rib pain from all of his coughing.  He states in order to get any mucus up he has to use Mucinex.  He denies any ear pain or sore throat.  No GI symptoms.  Past Medical History:  Diagnosis Date   Arthritis    Diabetes mellitus without complication ([place])    DVT (deep venous thrombosis) ([facility])    Lt leg   Dyspnea    Elevated lipids    Hyperlipidemia    Hypertension    Lower extremity edema     Patient Active Problem List   Diagnosis Date Noted   COVID-19 virus infection 09/01/2020   Status post total replacement of hip 07/22/2017   Essential hypertension 08/28/2016   Actinic keratosis 11/30/2015   Diabetes type 2, uncontrolled 02/15/2015   Morbid obesity ([place]) 06/18/2013   Left hip pain 04/02/2012   HSV (herpes simplex virus) infection 04/02/2012   CONTACT DERMATITIS&OTHER ECZEMA DUE UNSPEC CAUSE 09/14/2009   ERECTILE DYSFUNCTION 12/08/2008   Venous (peripheral) insufficiency 12/08/2008   HEPATITIS C, HX OF 12/08/2008   Hyperlipemia 11/24/2008   ESOPHAGITIS, REFLUX 11/18/2008    Past Surgical History:  Procedure Laterality Date   COLONOSCOPY WITH PROPOFOL N/A 09/23/2018   Per Dr. Alice Reichert, polyp, repeat in 3 yrs    EYE MUSCLE SURGERY     JOINT REPLACEMENT Left 07/22/2017   TOTAL HIP ARTHROPLASTY Left 07/22/2017   Procedure: TOTAL HIP ARTHROPLASTY;  Surgeon: Dereck Leep,  MD;  Location: [facility] ORS;  Service: Orthopedics;  Laterality: Left;       Home Medications    Prior to Admission medications   Medication Sig Start Date End Date Taking? Authorizing Provider  amoxicillin-clavulanate (AUGMENTIN) 875-125 MG tablet Take 1 tablet by mouth every 12 (twelve) hours for 10 days. 08/28/21 09/07/21 Yes Margarette Canada, NP  azithromycin (ZITHROMAX Z-PAK) 250 MG tablet Take 1 tablet (250 mg total) by mouth daily. Take 2 tablets on the first day and then 1 tablet daily thereafter for a total of 5 days of treatment. 08/28/21  Yes Margarette Canada, NP  benzonatate (TESSALON) 100 MG capsule Take 2 capsules (200 mg total) by mouth every 8 (eight) hours. 08/28/21  Yes Margarette Canada, NP  fluticasone (FLOVENT HFA) 110 MCG/ACT inhaler Inhale 2 puffs into the lungs in the morning and at bedtime. 09/01/20  Yes Laurey Morale, MD  glipiZIDE (GLUCOTROL) 10 MG tablet Take 1 tablet (10 mg total) by mouth 2 (two) times daily before a meal. 06/07/21  Yes Laurey Morale, MD  glucose blood test strip 1 each by Other route as needed for other. accu chek aviva plusUse as instructed   Yes [provider]  ketoconazole (NIZORAL) 2 % cream APPLY TO AFFECTED AREA TWICE DAILY AS NEEDED FOR IRRITATION 02/27/21  Yes Laurey Morale, MD  metFORMIN (GLUCOPHAGE) 500 MG tablet TAKE 2 (TWO) TABLETS AT BREAKFAST 06/07/21  Yes Laurey Morale, MD  pravastatin (PRAVACHOL) 20 MG tablet TAKE 1 TABLET ONCE DAILY. 12/13/20  Yes Laurey Morale, MD  promethazine-dextromethorphan (PROMETHAZINE-DM) 6.25-15 MG/5ML syrup Take 5 mLs by mouth 4 (four) times daily as needed. 08/28/21  Yes Margarette Canada, NP  tadalafil (CIALIS) 20 MG tablet Take 0.5-1 tablets (10-20 mg total) by mouth every other day as needed for erectile dysfunction. 06/06/21  Yes Laurey Morale, MD  albuterol (VENTOLIN HFA) 108 (90 Base) MCG/ACT inhaler Inhale 1-2 puffs into the lungs every 6 (six) hours as needed for wheezing or shortness of breath. 08/28/21    Margarette Canada, NP  amLODipine (NORVASC) 5 MG tablet TAKE 1 TABLET BY MOUTH ONCE DAILY. 08/28/21   Laurey Morale, MD    Family History Family History  Problem Relation Age of Onset   Emphysema Father     Social History Social History   Tobacco Use   Smoking status: Former   Smokeless tobacco: Never  Scientific laboratory technician Use: Never used  Substance Use Topics   Alcohol use: Yes    Alcohol/week: 10.0 standard drinks    Types: 10 Cans of beer per week   Drug use: No     Allergies   Patient has no known allergies.   Review of Systems Review of Systems  Constitutional:  Positive for diaphoresis. Negative for activity change, appetite change and fever.  HENT:  Positive for congestion and rhinorrhea. Negative for ear pain and sore throat.   Respiratory:  Positive for cough and shortness of breath. Negative for wheezing.   Gastrointestinal:  Negative for diarrhea, nausea and vomiting.  Skin:  Negative for rash.  Hematological: Negative.   Psychiatric/Behavioral: Negative.      Physical Exam Triage Vital Signs ED Triage Vitals  Enc Vitals Group     BP 08/28/21 0857 (!) 180/70     Pulse Rate 08/28/21 0857 95     Resp 08/28/21 0857 (!) 22     Temp 08/28/21 0857 98.3 F (36.8 C)     Temp Source 08/28/21 0857 Oral     SpO2 08/28/21 0857 94 %     Weight 08/28/21 0855 295 lb (133.8 kg)     Height 08/28/21 0855 6\' 3"  (1.905 m)     Head Circumference --      Peak Flow --      Pain Score 08/28/21 0855 3     Pain Loc --      Pain Edu? --      Excl. in [place]? --    No data found.  Updated Vital Signs BP (!) 180/70 (BP Location: Left Arm)   Pulse 95   Temp 98.3 F (36.8 C) (Oral)   Resp (!) 22   Ht 6\' 3"  (1.905 m)   Wt 295 lb (133.8 kg)   SpO2 94%   BMI 36.87 kg/m   Visual Acuity Right Eye Distance:   Left Eye Distance:   Bilateral Distance:    Right Eye Near:   Left Eye Near:    Bilateral Near:     Physical Exam Vitals and nursing note reviewed.   Constitutional:      General: He is not in acute distress.    Appearance: Normal appearance. He is ill-appearing.  HENT:     Head: Atraumatic.     Right Ear: Tympanic membrane, ear canal and external ear normal. There is no  impacted cerumen.     Left Ear: Tympanic membrane, ear canal and external ear normal. There is no impacted cerumen.     Nose: Congestion and rhinorrhea present.     Mouth/Throat:     Mouth: Mucous membranes are moist.     Pharynx: Oropharynx is clear. Posterior oropharyngeal erythema present.  Cardiovascular:     Rate and Rhythm: Normal rate and regular rhythm.     Pulses: Normal pulses.     Heart sounds: Normal heart sounds. No murmur heard.   No gallop.  Pulmonary:     Effort: Pulmonary effort is normal.     Breath sounds: Rales present. No wheezing or rhonchi.  Musculoskeletal:     Cervical back: Normal range of motion and neck supple.  Lymphadenopathy:     Cervical: No cervical adenopathy.  Skin:    General: Skin is warm and dry.     Capillary Refill: Capillary refill takes less than 2 seconds.     Findings: No erythema or rash.  Neurological:     General: No focal deficit present.     Mental Status: He is alert and oriented to person, place, and time.  Psychiatric:        Mood and Affect: Mood normal.        Behavior: Behavior normal.        Thought Content: Thought content normal.        Judgment: Judgment normal.     UC Treatments / Results  Labs (all labs ordered are listed, but only abnormal results are displayed) Labs Reviewed - No data to display  EKG   Radiology DG Chest 2 View  Result Date: 08/28/2021 CLINICAL DATA:  Cough.  Ribs hurt. EXAM: CHEST - 2 VIEW COMPARISON:  05/10/2021 FINDINGS: Increased patchy densities in lower lungs, left side greater than right. No large pleural effusions. Heart size is upper limits of normal but stable. Trachea is midline. No gross rib abnormality but this is not a dedicated rib examination.  IMPRESSION: New patchy densities in the lower lungs, left side greater than right. Findings are suggestive for pneumonia. Electronically Signed   By: Markus Daft M.D.   On: 08/28/2021 09:28    Procedures Procedures (including critical care time)  Medications Ordered in UC Medications - No data to display  Initial Impression / Assessment and Plan / UC Course  I have reviewed the triage vital signs and the nursing notes.  Pertinent labs & imaging results that were available during my care of the patient were reviewed by me and considered in my medical decision making (see chart for details).  Patient is a pleasant though ill-appearing 60 year old male here for evaluation of respiratory complaints outlined HPI above.  Patient is concerned that he has pneumonia because he is coughing up rusty colored sputum.  He states he had some pneumonia external to here.  Patient symptoms began 2 days ago.  Patient's physical exam reveals protegrin tympanic membranes bilaterally with normal light reflex and clear external auditory canals.  Nasal mucosa is mildly erythematous and edematous with scant clear nasal discharge.  Oropharyngeal exam reveals mild posterior oropharyngeal erythema with clear postnasal drip.  No cervical lymphadenopathy appreciated on exam.  Cardiopulmonary exam reveals fine crackles in the left lung base.  Will obtain chest x-ray to look for presence of pneumonia.  Chest x-ray independently reviewed and evaluated by me.  Impression: There are bilateral Paes she opacities in both lung bases.  Radiology overread is pending. Radiology impression is  new patchy densities in both lower lungs with left greater than the right.  Findings are suggestive of pneumonia.  We will treat patient for community-acquired pneumonia with dual therapy of Augmentin and azithromycin.  I will also prescribe butyryl to help with the wheezing and shortness of breath, Tessalon Perles and Promethazine DM cough syrup to  help with cough and congestion.  Patient vies to return for reevaluation, or go to the ER, for worsening shortness of breath or respiratory symptoms.   Final Clinical Impressions(s) / UC Diagnoses   Final diagnoses:  Community acquired pneumonia, unspecified laterality     Discharge Instructions      Your x-ray today demonstrated bilateral lower lobe pneumonia.  Can treat you with some antibiotics for this: The first antibiotic is Augmentin, take it twice daily with food for 10 days.  The second antibiotic is azithromycin, take 2 tablets a day and then 1 tablet each day after for total of 5 days of treatment.  Use the Tessalon Perles every 8 hours during the day as needed for cough.  Take these with a small sip of water.  You may get some numbness to the base of your tongue or metallic taste in her mouth, this is normal.  Use the Promethazine DM cough syrup as needed for cough at bedtime as will make you drowsy.  Use albuterol inhaler, 2 puffs every 4-6 hours, as needed for shortness of breath and wheezing.  If you develop any worsening shortness of breath please return for reevaluation or go to the emergency department.     ED Prescriptions     Medication Sig Dispense Auth. Provider   amoxicillin-clavulanate (AUGMENTIN) 875-125 MG tablet Take 1 tablet by mouth every 12 (twelve) hours for 10 days. 20 tablet Margarette Canada, NP   azithromycin (ZITHROMAX Z-PAK) 250 MG tablet Take 1 tablet (250 mg total) by mouth daily. Take 2 tablets on the first day and then 1 tablet daily thereafter for a total of 5 days of treatment. 6 tablet Margarette Canada, NP   benzonatate (TESSALON) 100 MG capsule Take 2 capsules (200 mg total) by mouth every 8 (eight) hours. 21 capsule Margarette Canada, NP   promethazine-dextromethorphan (PROMETHAZINE-DM) 6.25-15 MG/5ML syrup Take 5 mLs by mouth 4 (four) times daily as needed. 118 mL Margarette Canada, NP   albuterol (VENTOLIN HFA) 108 (90 Base) MCG/ACT inhaler Inhale  1-2 puffs into the lungs every 6 (six) hours as needed for wheezing or shortness of breath. 18 g Margarette Canada, NP      PDMP not reviewed this encounter.   Margarette Canada, NP 08/28/21 1013

## 2021-08-28 NOTE — ED Triage Notes (Signed)
Pt here with C/O of congestion and cough, SOB. Ribs hurt from pain. 2 days.

## 2021-08-29 MED ORDER — ALBUTEROL SULFATE HFA 108 (90 BASE) MCG/ACT IN AERS: 2.0000 | INHALATION_SPRAY | RESPIRATORY_TRACT | 0 refills | Status: DC | PRN

## 2021-08-29 NOTE — Progress Notes (Signed)
Patient called to say that the pharmacy did not receive the prescription for the albuterol inhaler.  A second prescription was electronically prescribed to [facility] in [place].

## 2021-09-15 ENCOUNTER — Other Ambulatory Visit: Payer: Self-pay | Admitting: Family Medicine

## 2021-09-17 ENCOUNTER — Other Ambulatory Visit: Payer: Self-pay | Admitting: Family Medicine

## 2021-09-18 ENCOUNTER — Other Ambulatory Visit: Payer: Self-pay

## 2021-09-18 ENCOUNTER — Encounter: Payer: Self-pay | Admitting: Family Medicine

## 2021-09-18 MED ORDER — KETOCONAZOLE 2 % EX CREA: TOPICAL_CREAM | CUTANEOUS | 1 refills | Status: DC

## 2021-09-30 ENCOUNTER — Other Ambulatory Visit: Payer: Self-pay | Admitting: Family Medicine

## 2021-10-02 MED ORDER — TRIAMCINOLONE ACETONIDE 0.1 % EX CREA: 1.0000 "application " | TOPICAL_CREAM | Freq: Three times a day (TID) | CUTANEOUS | 5 refills | Status: DC

## 2021-10-02 NOTE — Telephone Encounter (Signed)
I refilled the Triamcinolone cream

## 2021-11-16 ENCOUNTER — Encounter: Payer: Self-pay | Admitting: Family Medicine

## 2021-11-16 ENCOUNTER — Other Ambulatory Visit: Payer: Self-pay

## 2021-11-16 DIAGNOSIS — R053 Chronic cough: Secondary | ICD-10-CM

## 2021-11-16 MED ORDER — ALBUTEROL SULFATE HFA 108 (90 BASE) MCG/ACT IN AERS: 2.0000 | INHALATION_SPRAY | RESPIRATORY_TRACT | 0 refills | Status: DC | PRN

## 2021-11-21 DIAGNOSIS — L821 Other seborrheic keratosis: Secondary | ICD-10-CM | POA: Diagnosis not present

## 2021-11-21 DIAGNOSIS — L570 Actinic keratosis: Secondary | ICD-10-CM | POA: Diagnosis not present

## 2021-11-21 DIAGNOSIS — Z85828 Personal history of other malignant neoplasm of skin: Secondary | ICD-10-CM | POA: Diagnosis not present

## 2021-11-21 DIAGNOSIS — L281 Prurigo nodularis: Secondary | ICD-10-CM | POA: Diagnosis not present

## 2021-11-21 DIAGNOSIS — Z08 Encounter for follow-up examination after completed treatment for malignant neoplasm: Secondary | ICD-10-CM | POA: Diagnosis not present

## 2021-11-27 ENCOUNTER — Encounter: Payer: Self-pay | Admitting: Family Medicine

## 2021-11-28 ENCOUNTER — Other Ambulatory Visit: Payer: Self-pay

## 2021-11-28 DIAGNOSIS — I10 Essential (primary) hypertension: Secondary | ICD-10-CM

## 2021-11-28 MED ORDER — AMLODIPINE BESYLATE 5 MG PO TABS: 5.0000 mg | ORAL_TABLET | Freq: Every day | ORAL | 1 refills | Status: DC

## 2021-11-28 MED ORDER — GLIPIZIDE 10 MG PO TABS: 10.0000 mg | ORAL_TABLET | Freq: Two times a day (BID) | ORAL | 1 refills | Status: DC

## 2021-12-03 ENCOUNTER — Other Ambulatory Visit: Payer: Self-pay

## 2021-12-03 ENCOUNTER — Emergency Department: Payer: BC Managed Care – PPO

## 2021-12-03 ENCOUNTER — Emergency Department
Admission: EM | Admit: 2021-12-03 | Discharge: 2021-12-03 | Disposition: A | Discharge status: Home / Self Care | Payer: BC Managed Care – PPO | Attending: Emergency Medicine | Admitting: Emergency Medicine

## 2021-12-03 DIAGNOSIS — I82411 Acute embolism and thrombosis of right femoral vein: Secondary | ICD-10-CM | POA: Insufficient documentation

## 2021-12-03 DIAGNOSIS — I82441 Acute embolism and thrombosis of right tibial vein: Secondary | ICD-10-CM | POA: Diagnosis not present

## 2021-12-03 DIAGNOSIS — I82431 Acute embolism and thrombosis of right popliteal vein: Secondary | ICD-10-CM | POA: Diagnosis not present

## 2021-12-03 DIAGNOSIS — M79661 Pain in right lower leg: Secondary | ICD-10-CM | POA: Diagnosis not present

## 2021-12-03 MED ORDER — RIVAROXABAN (XARELTO) VTE STARTER PACK (15 & 20 MG): ORAL_TABLET | ORAL | 0 refills | Status: DC

## 2021-12-03 NOTE — ED Provider Notes (Signed)
[facility] Provider Note    Event Date/Time   First MD Initiated Contact with Patient 12/03/21 1133     (approximate)   History   Leg Pain   HPI  Christopher Marsh is a 61 y.o. male presents to the ER today with complaint of right lower extremity pain and swelling.  He reports this started 5 days ago.  He reports the pain initially started on the left side of his right knee but now seems more concentrated in his right calf.  He describes the pain as pressure, worse when he puts pressure on his right leg.  He has noticed some associated swelling.  He denies any injury to the area but did play basketball with his grandson the Sunday prior and is not sure if this is an associated factor or not.  He denies low back pain, right hip pain, numbness, tingling or weakness of his right lower extremity.  He does have a history of venous insufficiency and DM2.  He reports history of unprovoked DVT in his left leg 23 years ago (Lovenox bridge to Warfarin x 3 years) but is currently not on anticoagulation.     Physical Exam   Triage Vital Signs: ED Triage Vitals  Enc Vitals Group     BP 12/03/21 1138 (!) 179/72     Pulse Rate 12/03/21 1138 95     Resp 12/03/21 1138 18     Temp 12/03/21 1138 97.7 F (36.5 C)     Temp Source 12/03/21 1138 Oral     SpO2 12/03/21 1138 95 %     Weight 12/03/21 1141 295 lb (133.8 kg)     Height 12/03/21 1141 6\' 3"  (1.905 m)     Head Circumference --      Peak Flow --      Pain Score 12/03/21 1141 7     Pain Loc --      Pain Edu? --      Excl. in [place]? --     Most recent vital signs: Vitals:   12/03/21 1138  BP: (!) 179/72  Pulse: 95  Resp: 18  Temp: 97.7 F (36.5 C)  SpO2: 95%    General: Awake, no distress.  CV:  RRR, no murmur.  Pedal pulses 2+ bilaterally.  1+ pitting edema RLE, trace pitting edema LLE.  Negative Homans' sign on the right. Resp:  Normal effort.  CTA bilaterally  MSK:  Normal flexion and extension of the  right knee.  No joint swelling noted.  No pain with palpation of the right knee.  Strength 5/5 BLE.   Skin:  PVD changes of BLE without ulceration.  ED Results / Procedures / Treatments    RADIOLOGY Imaging Orders         US Venous Img Lower Unilateral Right     IMPRESSION: The examination is positive for predominantly occlusive DVT extending from the mid aspect of the femoral vein through the imaged tibial veins. While age-indeterminate, given provided history of previous DVT, an acute on chronic process is not excluded. Clinical correlation is advised.  MEDICATIONS ORDERED IN ED: Medications - No data to display   IMPRESSION / MDM / [place] / ED COURSE  I reviewed the triage vital signs and the nursing notes.  RLE Pain and Swelling:  Differential diagnosis includes, but is not limited to gastrocnemius strain, gastrocnemius tear, DVT RLE.  [place] RLE per my read shows a DVT of femoral vein, per radiology  read, this was confirmed but also extends down into the tibial veins- predominately occlusive RX for Xarelto starter pack He declines RX for pain medication at this time Encouraged elevation to reduce pain and swelling Will have him follow up with vascular as an outpatient (advised him if vascular does not do work up for coagulation disorder, that he may need to seem hematology as well, given the fact that this is his 2nd DVT.      FINAL CLINICAL IMPRESSION(S) / ED DIAGNOSES   Final diagnoses:  Acute deep vein thrombosis (DVT) of femoral vein of right lower extremity ([place])     Rx / DC Orders   ED Discharge Orders          Ordered    RIVAROXABAN (XARELTO) VTE STARTER PACK (15 & 20 MG)        12/03/21 1347             Note:  This document was prepared using Dragon voice recognition software and may include unintentional dictation errors.    Jearld Fenton, NP 12/03/21 1348    Lavonia Drafts, MD 12/03/21 1409

## 2021-12-03 NOTE — ED Notes (Signed)
E sig pad not working. Verbal consent obtained for discharge.

## 2021-12-03 NOTE — ED Notes (Signed)
See triage note  presents with pain to right leg  states he noticed pain on Wednesday  states he also noticed some swelling

## 2021-12-03 NOTE — ED Triage Notes (Signed)
Pt state on Wednesday he started noticing pain in his R leg- pt thought he pulled a muscle but then it started to swell- pt states about 20 years ago he had a blood clot in his L leg

## 2021-12-03 NOTE — Discharge Instructions (Signed)
You were seen today for right leg pain and swelling.  The ultrasound of your leg was positive for DVT in the femoral vein that extends down to the tibial veins.  I recommend elevation to help reduce pain and swelling.  I would not go back to work until the pain improves.  We are starting you on an anticoagulation medication called Xarelto, please take as directed.  I have given you information to follow-up with vascular as an outpatient.  Please call them tomorrow and schedule a follow-up.

## 2021-12-05 ENCOUNTER — Encounter: Payer: Self-pay | Admitting: Family Medicine

## 2021-12-06 ENCOUNTER — Other Ambulatory Visit: Payer: Self-pay

## 2021-12-06 ENCOUNTER — Ambulatory Visit (INDEPENDENT_AMBULATORY_CARE_PROVIDER_SITE_OTHER): Payer: BC Managed Care – PPO | Admitting: Nurse Practitioner

## 2021-12-06 VITALS — BP 185/83 | HR 75 | Ht 75.0 in | Wt 297.0 lb

## 2021-12-06 DIAGNOSIS — I82411 Acute embolism and thrombosis of right femoral vein: Secondary | ICD-10-CM

## 2021-12-06 DIAGNOSIS — E7800 Pure hypercholesterolemia, unspecified: Secondary | ICD-10-CM | POA: Diagnosis not present

## 2021-12-06 MED ORDER — HYDROCODONE-ACETAMINOPHEN 5-325 MG PO TABS: 1.0000 | ORAL_TABLET | Freq: Four times a day (QID) | ORAL | 0 refills | Status: DC | PRN

## 2021-12-07 ENCOUNTER — Encounter (INDEPENDENT_AMBULATORY_CARE_PROVIDER_SITE_OTHER): Payer: Self-pay

## 2021-12-08 ENCOUNTER — Encounter: Payer: Self-pay | Admitting: *Deleted

## 2021-12-11 ENCOUNTER — Encounter (INDEPENDENT_AMBULATORY_CARE_PROVIDER_SITE_OTHER): Payer: Self-pay | Admitting: Nurse Practitioner

## 2021-12-11 NOTE — Progress Notes (Signed)
Subjective:    Patient ID: Christopher Marsh, male    DOB: 1961-10-20, 61 y.o.   MRN: [id number] Chief Complaint  Patient presents with   Follow-up    Follow up from [facility] Monday with Broadus John MD further eval    Christopher Marsh is a 61 year old male that presents today for evaluation after subsequent discovery of a DVT on 12/03/2021.  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  He has previously had a DVT in his left lower extremity however this is not his right.  The ultrasound results show that the thrombus is not completely acute in nature and has some chronic changes.  The patient was started on Xarelto and he has tolerated well.  Previously when he had his blood clot over 2 decades ago he was on Coumadin.  He denies any issues with bleeding currently.  He does note discomfort and swelling in the right lower extremity.   Review of Systems  Cardiovascular:  Positive for leg swelling.  All other systems reviewed and are negative.     Objective:   Physical Exam Vitals reviewed.  HENT:     Head: Normocephalic.  Cardiovascular:     Rate and Rhythm: Normal rate.     Pulses: Normal pulses.  Pulmonary:     Effort: Pulmonary effort is normal.  Musculoskeletal:     Right lower leg: Edema present.  Skin:    General: Skin is warm and dry.  Neurological:     Mental Status: He is alert and oriented to person, place, and time.  Psychiatric:        Mood and Affect: Mood normal.        Behavior: Behavior normal.        Thought Content: Thought content normal.        Judgment: Judgment normal.    BP (!) 185/83    Pulse 75    Ht 6\' 3"  (1.905 m)    Wt 297 lb (134.7 kg)    BMI 37.12 kg/m   Past Medical History:  Diagnosis Date   Arthritis    Diabetes mellitus without complication (HCC)    DVT (deep venous thrombosis) (HCC)    Lt leg   Dyspnea    Elevated lipids    Hyperlipidemia    Hypertension    Lower extremity  edema     Social History   Socioeconomic History   Marital status: Married    Spouse name: Not on file   Number of children: Not on file   Years of education: Not on file   Highest education level: Not on file  Occupational History   Not on file  Tobacco Use   Smoking status: Former   Smokeless tobacco: Never  Vaping Use   Vaping Use: Never used  Substance and Sexual Activity   Alcohol use: Yes    Alcohol/week: 10.0 standard drinks    Types: 10 Cans of beer per week   Drug use: No   Sexual activity: Not on file  Other Topics Concern   Not on file  Social History Narrative   Not on file   Social Determinants of Health   Financial Resource Strain: Not on file  Food Insecurity: Not on file  Transportation Needs: Not on file  Physical Activity: Not on file  Stress: Not on file  Social Connections: Not on file  Intimate Partner Violence:  Not on file    Past Surgical History:  Procedure Laterality Date   COLONOSCOPY WITH PROPOFOL N/A 09/23/2018   Per Dr. Alice Reichert, polyp, repeat in 3 yrs    EYE MUSCLE SURGERY     JOINT REPLACEMENT Left 07/22/2017   TOTAL HIP ARTHROPLASTY Left 07/22/2017   Procedure: TOTAL HIP ARTHROPLASTY;  Surgeon: Dereck Leep, MD;  Location: [facility] ORS;  Service: Orthopedics;  Laterality: Left;    Family History  Problem Relation Age of Onset   Emphysema Father     No Known Allergies  CBC Latest Ref Rng & Units 06/06/2021 09/09/2019 11/18/2017  WBC 4.0 - 10.5 K/uL 5.1 4.8 6.1  Hemoglobin 13.0 - 17.0 g/dL 16.9 17.0 16.3  Hematocrit 39.0 - 52.0 % 50.7 51.0 48.7  Platelets 150.0 - 400.0 K/uL 231.0 229.0 237.0      CMP     Component Value Date/Time   NA 137 06/06/2021 1021   K 4.8 06/06/2021 1021   CL 99 06/06/2021 1021   CO2 30 06/06/2021 1021   GLUCOSE 152 (H) 06/06/2021 1021   BUN 8 06/06/2021 1021   CREATININE 0.84 06/06/2021 1021   CALCIUM 9.2 06/06/2021 1021   PROT 6.5 06/06/2021 1021   ALBUMIN 4.2 06/06/2021 1021   AST 20  06/06/2021 1021   ALT 27 06/06/2021 1021   ALKPHOS 56 06/06/2021 1021   BILITOT 0.9 06/06/2021 1021   GFRNONAA >60 07/24/2017 0439   GFRAA >60 07/24/2017 0439     No results found.     Assessment & Plan:   1. Acute deep vein thrombosis (DVT) of femoral vein of right lower extremity (HCC) The patient I had a long discussion in regards to a thrombectomy and the difference between acute thrombus versus a chronic thrombus.  Ultrasound results indicate that there is a chronic component of his current thrombus.  Also review of the images do show that the thrombus is mixed in nature not completely acute.  Based on this a thrombectomy would be less likely to be successful.  Based on this the patient will continue anticoagulation we will prescribe some pain medication to help with the discomfort.  Because this is the patient's second blood clot after minimum he should remain on anticoagulation for at least 6 months.  However this is thought to be somewhat provoked given the recurrent nature we will refer the patient to hematology for further work-up to determine if the patient should remain on anticoagulation for lifetime. - Ambulatory referral to Hematology / Oncology  2. Pure hypercholesterolemia Continue statin as ordered and reviewed, no changes at this time    Current Outpatient Medications on File Prior to Visit  Medication Sig Dispense Refill   albuterol (VENTOLIN HFA) 108 (90 Base) MCG/ACT inhaler Inhale 2 puffs into the lungs every 4 (four) hours as needed. 18 g 0   amLODipine (NORVASC) 5 MG tablet Take 1 tablet (5 mg total) by mouth daily. 90 tablet 1   azithromycin (ZITHROMAX Z-PAK) 250 MG tablet Take 1 tablet (250 mg total) by mouth daily. Take 2 tablets on the first day and then 1 tablet daily thereafter for a total of 5 days of treatment. 6 tablet 0   benzonatate (TESSALON) 100 MG capsule Take 2 capsules (200 mg total) by mouth every 8 (eight) hours. 21 capsule 0   fluticasone  (FLOVENT HFA) 110 MCG/ACT inhaler Inhale 2 puffs into the lungs in the morning and at bedtime. 12 g 1   glipiZIDE (GLUCOTROL) 10 MG tablet  Take 1 tablet (10 mg total) by mouth 2 (two) times daily before a meal. 180 tablet 1   glucose blood test strip 1 each by Other route as needed for other. accu chek aviva plusUse as instructed     ketoconazole (NIZORAL) 2 % cream APPLY TO AFFECTED AREA TWICE DAILY AS NEEDED FOR IRRITATION 30 g 1   metFORMIN (GLUCOPHAGE) 500 MG tablet TAKE 2 (TWO) TABLETS AT BREAKFAST 180 tablet 3   pravastatin (PRAVACHOL) 20 MG tablet TAKE 1 TABLET ONCE DAILY. 90 tablet 3   promethazine-dextromethorphan (PROMETHAZINE-DM) 6.25-15 MG/5ML syrup Take 5 mLs by mouth 4 (four) times daily as needed. 118 mL 0   RIVAROXABAN (XARELTO) VTE STARTER PACK (15 & 20 MG) Follow package directions: Take one 15mg  tablet by mouth twice a day. On day 22, switch to one 20mg  tablet once a day. Take with food. 51 each 0   tadalafil (CIALIS) 20 MG tablet Take 0.5-1 tablets (10-20 mg total) by mouth every other day as needed for erectile dysfunction. 20 tablet 11   triamcinolone cream (KENALOG) 0.1 % Apply 1 application topically 3 (three) times daily. 453.6 g 5   No current facility-administered medications on file prior to visit.    There are no Patient Instructions on file for this visit. No follow-ups on file.   Kris Hartmann, NP

## 2021-12-17 ENCOUNTER — Encounter: Payer: Self-pay | Admitting: Family Medicine

## 2021-12-18 ENCOUNTER — Other Ambulatory Visit: Payer: Self-pay

## 2021-12-18 ENCOUNTER — Encounter: Payer: Self-pay | Admitting: Oncology

## 2021-12-18 ENCOUNTER — Inpatient Hospital Stay: Payer: BC Managed Care – PPO

## 2021-12-18 ENCOUNTER — Inpatient Hospital Stay: Payer: BC Managed Care – PPO | Attending: Oncology | Admitting: Oncology

## 2021-12-18 VITALS — BP 162/90 | HR 81 | Temp 98.0°F | Resp 18 | Wt 300.9 lb

## 2021-12-18 DIAGNOSIS — I82411 Acute embolism and thrombosis of right femoral vein: Secondary | ICD-10-CM

## 2021-12-18 DIAGNOSIS — D751 Secondary polycythemia: Secondary | ICD-10-CM | POA: Diagnosis not present

## 2021-12-18 DIAGNOSIS — R760 Raised antibody titer: Secondary | ICD-10-CM | POA: Diagnosis not present

## 2021-12-18 LAB — COMPREHENSIVE METABOLIC PANEL
ALT: 21 U/L (ref 0–44)
AST: 20 U/L (ref 15–41)
Albumin: 3.6 g/dL (ref 3.5–5.0)
Alkaline Phosphatase: 57 U/L (ref 38–126)
Anion gap: 7 (ref 5–15)
BUN: 9 mg/dL (ref 8–23)
CO2: 25 mmol/L (ref 22–32)
Calcium: 8.4 mg/dL — ABNORMAL LOW (ref 8.9–10.3)
Chloride: 102 mmol/L (ref 98–111)
Creatinine, Ser: 0.74 mg/dL (ref 0.61–1.24)
GFR, Estimated: >60 mL/min (ref >60)
Glucose, Bld: 138 mg/dL — ABNORMAL HIGH (ref 70–99)
Potassium: 3.9 mmol/L (ref 3.5–5.1)
Sodium: 134 mmol/L — ABNORMAL LOW (ref 135–145)
Total Bilirubin: 0.6 mg/dL (ref 0.3–1.2)
Total Protein: 6.5 g/dL (ref 6.5–8.1)

## 2021-12-18 LAB — CBC WITH DIFFERENTIAL/PLATELET
Abs Immature Granulocytes: 0.01 10*3/uL (ref 0.00–0.07)
Basophils Absolute: 0.1 10*3/uL (ref 0.0–0.1)
Basophils Relative: 1 %
Eosinophils Absolute: 0.1 10*3/uL (ref 0.0–0.5)
Eosinophils Relative: 1 %
HCT: 52.1 % — ABNORMAL HIGH (ref 39.0–52.0)
Hemoglobin: 17.7 g/dL — ABNORMAL HIGH (ref 13.0–17.0)
Immature Granulocytes: 0 %
Lymphocytes Relative: 9 %
Lymphs Abs: 0.5 10*3/uL — ABNORMAL LOW (ref 0.7–4.0)
MCH: 30.7 pg (ref 26.0–34.0)
MCHC: 34 g/dL (ref 30.0–36.0)
MCV: 90.3 fL (ref 80.0–100.0)
Monocytes Absolute: 0.6 10*3/uL (ref 0.1–1.0)
Monocytes Relative: 10 %
Neutro Abs: 4.6 10*3/uL (ref 1.7–7.7)
Neutrophils Relative %: 79 %
Platelets: 273 10*3/uL (ref 150–400)
RBC: 5.77 MIL/uL (ref 4.22–5.81)
RDW: 12.3 % (ref 11.5–15.5)
WBC: 5.8 10*3/uL (ref 4.0–10.5)
nRBC: 0 % (ref 0.0–0.2)

## 2021-12-18 MED ORDER — PRAVASTATIN SODIUM 20 MG PO TABS: 20.0000 mg | ORAL_TABLET | Freq: Every day | ORAL | 1 refills | Status: DC

## 2021-12-18 NOTE — Addendum Note (Signed)
Addended by: Earlie Server on: 12/18/2021 07:46 PM   Modules accepted: Orders

## 2021-12-18 NOTE — Progress Notes (Addendum)
Hematology/Oncology Consult note [phone] [phone]         Patient Care Team: Laurey Morale, MD as PCP - General (Family Medicine) Earlie Server, MD as Consulting Physician (Hematology and Oncology) Kris Hartmann, NP as Nurse Practitioner (Vascular Surgery)  REFERRING PROVIDER: Kris Hartmann, NP  CHIEF COMPLAINTS/REASON FOR VISIT:  Evaluation of DVT  HISTORY OF PRESENTING ILLNESS:   Christopher Marsh is a  61 y.o.  male with PMH listed below was seen in consultation at the request of  Kris Hartmann, NP  for evaluation of DVT  Patient reports remote history of left lower extremity DVT in 2002.  He was initiated on Lovenox and bridged to Coumadin.  Patient took warfarin for 2 years before anticoagulation was stopped. 12/03/2021, patient presented to emergency room for evaluation of right lower extremity pain and swelling for about a week.  Started on the inner side of right thigh and migrated to the right calf. + Associated with swelling.  Denies any recent injury, hospitalization, surgery.  He first noticed the symptoms after playing basketball with his grandson.   12/03/2021, right lower extremity ultrasound showed occlusive DVT extending from the mid aspect of the femoral vein through the imaged tibial vein.  Age-indeterminate. Patient was started on Xarelto. He was referred to establish care with vascular surgeon and was seen by [name] on 12/06/2021.  Shared decision was made not to proceed with embolectomy.  Continue anticoagulation. Patient was referred to hematology oncology for further evaluation.  Patient denies any family history of blood clots.  Denies any unintentional weight loss, fever or night sweats. 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  He sometimes stay in his car while waiting for next assessment appointment.  He reports the right lower  extremity symptom has improved since the start of Xarelto.  No active bleeding events. Review of Systems  Constitutional:  Negative for appetite change, chills, fatigue and fever.  HENT:   Negative for hearing loss and voice change.   Eyes:  Negative for eye problems.  Respiratory:  Negative for chest tightness and cough.   Cardiovascular:  Negative for chest pain.  Gastrointestinal:  Negative for abdominal distention, abdominal pain and blood in stool.  Endocrine: Negative for hot flashes.  Genitourinary:  Negative for difficulty urinating and frequency.   Musculoskeletal:  Negative for arthralgias.       Right lower extremity tenderness and swelling.  Skin:  Negative for itching and rash.  Neurological:  Negative for extremity weakness.  Hematological:  Negative for adenopathy.  Psychiatric/Behavioral:  Negative for confusion.    MEDICAL HISTORY:  Past Medical History:  Diagnosis Date   Arthritis    Diabetes mellitus without complication (HCC)    DVT (deep venous thrombosis) (HCC)    Lt leg   Dyspnea    Elevated lipids    Hyperlipidemia    Hypertension    Lower extremity edema     SURGICAL HISTORY: Past Surgical History:  Procedure Laterality Date   COLONOSCOPY WITH PROPOFOL N/A 09/23/2018   Per Dr. Alice Reichert, polyp, repeat in 3 yrs    EYE MUSCLE SURGERY     JOINT REPLACEMENT Left 07/22/2017   TOTAL HIP ARTHROPLASTY Left 07/22/2017   Procedure: TOTAL HIP ARTHROPLASTY;  Surgeon: Dereck Leep, MD;  Location: [facility] ORS;  Service: Orthopedics;  Laterality: Left;    SOCIAL HISTORY: Social History   Socioeconomic  History   Marital status: Married    Spouse name: Not on file   Number of children: Not on file   Years of education: Not on file   Highest education level: Not on file  Occupational History   Not on file  Tobacco Use   Smoking status: Former    Packs/day: 0.50    Years: 6.00    Pack years: 3.00    Types: Cigarettes    Quit date: 30    Years since  quitting: 41.1   Smokeless tobacco: Never  Vaping Use   Vaping Use: Never used  Substance and Sexual Activity   Alcohol use: Not Currently    Comment: 2-3 nights a week   Drug use: No   Sexual activity: Not on file  Other Topics Concern   Not on file  Social History Narrative   Not on file   Social Determinants of Health   Financial Resource Strain: Not on file  Food Insecurity: Not on file  Transportation Needs: Not on file  Physical Activity: Not on file  Stress: Not on file  Social Connections: Not on file  Intimate Partner Violence: Not on file    FAMILY HISTORY: Family History  Problem Relation Age of Onset   Breast cancer Mother    Emphysema Father    Colon cancer Maternal Grandmother     ALLERGIES:  has No Known Allergies.  MEDICATIONS:  Current Outpatient Medications  Medication Sig Dispense Refill   albuterol (VENTOLIN HFA) 108 (90 Base) MCG/ACT inhaler Inhale 2 puffs into the lungs every 4 (four) hours as needed. 18 g 0   amLODipine (NORVASC) 5 MG tablet Take 1 tablet (5 mg total) by mouth daily. 90 tablet 1   fluticasone (FLOVENT HFA) 110 MCG/ACT inhaler Inhale 2 puffs into the lungs in the morning and at bedtime. 12 g 1   glipiZIDE (GLUCOTROL) 10 MG tablet Take 1 tablet (10 mg total) by mouth 2 (two) times daily before a meal. 180 tablet 1   glucose blood test strip 1 each by Other route as needed for other. accu chek aviva plusUse as instructed     ketoconazole (NIZORAL) 2 % cream APPLY TO AFFECTED AREA TWICE DAILY AS NEEDED FOR IRRITATION 30 g 1   metFORMIN (GLUCOPHAGE) 500 MG tablet TAKE 2 (TWO) TABLETS AT BREAKFAST 180 tablet 3   pravastatin (PRAVACHOL) 20 MG tablet Take 1 tablet (20 mg total) by mouth daily. 90 tablet 1   RIVAROXABAN (XARELTO) VTE STARTER PACK (15 & 20 MG) Follow package directions: Take one 15mg  tablet by mouth twice a day. On day 22, switch to one 20mg  tablet once a day. Take with food. 51 each 0   tadalafil (CIALIS) 20 MG tablet Take  0.5-1 tablets (10-20 mg total) by mouth every other day as needed for erectile dysfunction. 20 tablet 11   triamcinolone cream (KENALOG) 0.1 % Apply 1 application topically 3 (three) times daily. 453.6 g 5   benzonatate (TESSALON) 100 MG capsule Take 2 capsules (200 mg total) by mouth every 8 (eight) hours. (Patient not taking: Reported on 12/18/2021) 21 capsule 0   HYDROcodone-acetaminophen (NORCO/VICODIN) 5-325 MG tablet Take 1 tablet by mouth every 6 (six) hours as needed for moderate pain. (Patient not taking: Reported on 12/18/2021) 30 tablet 0   promethazine-dextromethorphan (PROMETHAZINE-DM) 6.25-15 MG/5ML syrup Take 5 mLs by mouth 4 (four) times daily as needed. (Patient not taking: Reported on 12/18/2021) 118 mL 0   No current facility-administered medications for  this visit.     PHYSICAL EXAMINATION: ECOG PERFORMANCE STATUS: 0 - Asymptomatic Vitals:   12/18/21 0934  BP: (!) 162/90  Pulse: 81  Resp: 18  Temp: 98 F (36.7 C)   Filed Weights   12/18/21 0934  Weight: (!) 300 lb 14.4 oz (136.5 kg)    Physical Exam Constitutional:      General: He is not in acute distress. HENT:     Head: Normocephalic and atraumatic.  Eyes:     General: No scleral icterus. Cardiovascular:     Rate and Rhythm: Normal rate and regular rhythm.     Heart sounds: Normal heart sounds.  Pulmonary:     Effort: Pulmonary effort is normal. No respiratory distress.     Breath sounds: No wheezing.  Abdominal:     General: Bowel sounds are normal. There is no distension.     Palpations: Abdomen is soft.  Musculoskeletal:        General: Swelling present. No deformity. Normal range of motion.     Cervical back: Normal range of motion and neck supple.     Right lower leg: Edema present.  Skin:    General: Skin is warm and dry.     Findings: No erythema or rash.  Neurological:     Mental Status: He is alert and oriented to person, place, and time. Mental status is at baseline.     Cranial Nerves:  No cranial nerve deficit.     Coordination: Coordination normal.  Psychiatric:        Mood and Affect: Mood normal.    LABORATORY DATA:  I have reviewed the data as listed Lab Results  Component Value Date   WBC 5.8 12/18/2021   HGB 17.7 (H) 12/18/2021   HCT 52.1 (H) 12/18/2021   MCV 90.3 12/18/2021   PLT 273 12/18/2021   Recent Labs    06/06/21 1021 12/18/21 1015  NA 137 134*  K 4.8 3.9  CL 99 102  CO2 30 25  GLUCOSE 152* 138*  BUN 8 9  CREATININE 0.84 0.74  CALCIUM 9.2 8.4*  GFRNONAA  --  >60  PROT 6.5 6.5  ALBUMIN 4.2 3.6  AST 20 20  ALT 27 21  ALKPHOS 56 57  BILITOT 0.9 0.6  BILIDIR 0.2  --    Iron/TIBC/Ferritin/ %Sat No results found for: IRON, TIBC, FERRITIN, IRONPCTSAT    RADIOGRAPHIC STUDIES: I have personally reviewed the radiological images as listed and agreed with the findings in the report. US Venous Img Lower Unilateral Right  Result Date: 12/03/2021 CLINICAL DATA:  Right lower extremity pain and edema for the past 4 days. History of previous DVT, not currently on anticoagulation. Evaluate for acute or chronic DVT. EXAM: RIGHT LOWER EXTREMITY VENOUS DOPPLER ULTRASOUND TECHNIQUE: Gray-scale sonography with graded compression, as well as color Doppler and duplex ultrasound were performed to evaluate the lower extremity deep venous systems from the level of the common femoral vein and including the common femoral, femoral, profunda femoral, popliteal and calf veins including the posterior tibial, peroneal and gastrocnemius veins when visible. The superficial great saphenous vein was also interrogated. Spectral Doppler was utilized to evaluate flow at rest and with distal augmentation maneuvers in the common femoral, femoral and popliteal veins. COMPARISON:  None. FINDINGS: Contralateral Common Femoral Vein: Respiratory phasicity is normal and symmetric with the symptomatic side. No evidence of thrombus. Normal compressibility. Common Femoral Vein: No evidence of  thrombus. Normal compressibility, respiratory phasicity and response to augmentation. Saphenofemoral  Junction: No evidence of thrombus. Normal compressibility and flow on color Doppler imaging. Profunda Femoral Vein: No evidence of thrombus. Normal compressibility and flow on color Doppler imaging. Femoral Vein: While the proximal aspect of the right femoral vein is widely patent (image 19), there is mixed echogenic near occlusive thrombus involving the mid (images 21-23) and distal (images 25 and 27) aspects of the right femoral vein Popliteal Vein: There is hypoechoic slightly expansile occlusive thrombus involving the right popliteal vein (images 28 through 34). Calf Veins: There is hypoechoic occlusive thrombus involving both paired segments of the right posterior tibial (images 35 and 36) and peroneal veins (images 38 and 39). Superficial Great Saphenous Vein: No evidence of thrombus. Normal compressibility. Other Findings:  None. IMPRESSION: The examination is positive for predominantly occlusive DVT extending from the mid aspect of the femoral vein through the imaged tibial veins. While age-indeterminate, given provided history of previous DVT, an acute on chronic process is not excluded. Clinical correlation is advised. Electronically Signed   By: Sandi Mariscal M.D.   On: 12/03/2021 13:09      ASSESSMENT & PLAN:  1. DVT of deep femoral vein, right ([place])   2. Erythrocytosis   3. Lupus anticoagulant positive    #Recurrent lower extremity DVT. Provoking versus unprovoking. He does not have any obvious immobilization triggers except the fact that his job requires multiple hour-long driving.  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 Agree with Xarelto for anticoagulation.  Recommend 3 to 6 months of anticoagulation.   I recommend checking hypercoagulable work-up including antiphospholipid syndrome, factor V Leiden mutation, prothrombin gene mutation, protein S and protein C. Even if  the intermittent hour-long driving attributes to thrombosis, if he continues the same work, this " provoking" factor may attribute to recurrent thrombosis.  I favor a low-dose long-term anticoagulation prophylaxis after he finishes the initial therapeutic treatment phase.  He agrees with the plan. Recommend age-appropriate cancer screening.  #Erythrocytosis, hemoglobin 17.7, hematocrit 52.1.  Not a smoker currently.  We will check erythrocytosis work-up.  Follow-up in 3 months.   Orders Placed This Encounter  Procedures   Comprehensive metabolic panel    Standing Status:   Future    Number of Occurrences:   1    Standing Expiration Date:   12/18/2022   CBC with Differential/Platelet    Standing Status:   Future    Number of Occurrences:   1    Standing Expiration Date:   12/18/2022   ANTIPHOSPHOLIPID SYNDROME PROF    Standing Status:   Future    Number of Occurrences:   1    Standing Expiration Date:   12/18/2022   Factor 5 leiden    Standing Status:   Future    Number of Occurrences:   1    Standing Expiration Date:   12/18/2022   Prothrombin gene mutation    Standing Status:   Future    Number of Occurrences:   1    Standing Expiration Date:   12/18/2022   Protein S, total and free    Standing Status:   Future    Number of Occurrences:   1    Standing Expiration Date:   12/18/2022   Protein C activity    Standing Status:   Future    Number of Occurrences:   1    Standing Expiration Date:   12/18/2022    All questions were answered. The patient knows to call the clinic  with any problems questions or concerns.  cc Kris Hartmann, NP    Return of visit: 3 months Thank you for this kind referral and the opportunity to participate in the care of this patient. A copy of today's note is routed to referring provider   Earlie Server, MD, PhD [facility] Hematology Oncology 12/18/2021  Addendum 01/01/22 I called patient and reviewed his blood work. Hypercoagulable work-up showed  positive lupus anticoagulant, negative factor V Leiden mutation, prothrombin gene mutation, normal protein S and C.  I discussed with patient that the lupus anticoagulant positivity need to be confirmed in 12 weeks. For now, I recommend patient to switch to Coumadin with INR goal of 2-3. Plan to stop Xarelto, switch to Lovenox 1 mg/kilogram twice daily for 7 days, bridging to Coumadin.   Recommend to continue the lovenox until the INR is therapeutic on warfarin.  I spoke with patient's primary care provider Dr. Sarajane Jews who will arrange patient to establish with Coumadin clinic. Repeat lupus anticoagulant level in 3 months.   Earlie Server

## 2021-12-19 ENCOUNTER — Telehealth: Payer: Self-pay

## 2021-12-19 LAB — ANTIPHOSPHOLIPID SYNDROME PROF
Anticardiolipin IgG: <9 GPL U/mL (ref 0–14)
Anticardiolipin IgM: <9 MPL U/mL (ref 0–12)
DRVVT: 76.3 s — ABNORMAL HIGH (ref 0.0–47.0)
PTT Lupus Anticoagulant: 40.5 s (ref 0.0–43.5)

## 2021-12-19 LAB — DRVVT MIX: dRVVT Mix: 57.3 s — ABNORMAL HIGH (ref 0.0–40.4)

## 2021-12-19 LAB — PROTEIN S, TOTAL AND FREE
Protein S Ag, Free: 133 % (ref 61–136)
Protein S Ag, Total: 86 % (ref 60–150)

## 2021-12-19 LAB — PROTEIN C ACTIVITY: Protein C Activity: 125 % (ref 73–180)

## 2021-12-19 LAB — DRVVT CONFIRM: dRVVT Confirm: 1.5 ratio — ABNORMAL HIGH (ref 0.8–1.2)

## 2021-12-19 NOTE — Telephone Encounter (Signed)
Pt informed and verbalized understanding. Please schedule him for labs on Friday 2/24 @ 10:15a, pt aware.

## 2021-12-19 NOTE — Telephone Encounter (Signed)
Lab appt has been scheduled.  

## 2021-12-19 NOTE — Telephone Encounter (Signed)
-----   Message from Earlie Server, MD sent at 12/18/2021  7:45 PM EST ----- Please let patient know that his CBC came back showing an increased level of red blood cell count.  Recommend additional blood work.  Please arrange.  Labs ordered.  Thank you

## 2021-12-21 ENCOUNTER — Encounter (INDEPENDENT_AMBULATORY_CARE_PROVIDER_SITE_OTHER): Payer: Self-pay

## 2021-12-21 LAB — FACTOR 5 LEIDEN

## 2021-12-22 ENCOUNTER — Other Ambulatory Visit: Payer: Self-pay

## 2021-12-22 ENCOUNTER — Other Ambulatory Visit (INDEPENDENT_AMBULATORY_CARE_PROVIDER_SITE_OTHER): Payer: Self-pay | Admitting: Nurse Practitioner

## 2021-12-22 ENCOUNTER — Inpatient Hospital Stay: Payer: BC Managed Care – PPO

## 2021-12-22 DIAGNOSIS — D751 Secondary polycythemia: Secondary | ICD-10-CM | POA: Diagnosis not present

## 2021-12-22 DIAGNOSIS — I82411 Acute embolism and thrombosis of right femoral vein: Secondary | ICD-10-CM | POA: Diagnosis not present

## 2021-12-22 MED ORDER — RIVAROXABAN 20 MG PO TABS: 20.0000 mg | ORAL_TABLET | Freq: Every day | ORAL | 6 refills | Status: DC

## 2021-12-23 LAB — ERYTHROPOIETIN: Erythropoietin: 19.4 m[IU]/mL — ABNORMAL HIGH (ref 2.6–18.5)

## 2021-12-25 LAB — PROTHROMBIN GENE MUTATION

## 2021-12-26 LAB — BCR-ABL1 FISH
Cells Analyzed: 200
Cells Counted: 200

## 2021-12-26 LAB — CARBON MONOXIDE, BLOOD (PERFORMED AT REF LAB): Carbon Monoxide, Blood: 2.7 % (ref 0.0–3.6)

## 2021-12-29 ENCOUNTER — Encounter: Payer: Self-pay | Admitting: Oncology

## 2021-12-29 DIAGNOSIS — I82411 Acute embolism and thrombosis of right femoral vein: Secondary | ICD-10-CM

## 2021-12-29 NOTE — Telephone Encounter (Signed)
Please advise. No appts scheduled until May.  ?

## 2022-01-01 ENCOUNTER — Encounter: Payer: Self-pay | Admitting: Oncology

## 2022-01-01 ENCOUNTER — Telehealth: Payer: Self-pay

## 2022-01-01 ENCOUNTER — Other Ambulatory Visit: Payer: Self-pay | Admitting: Oncology

## 2022-01-01 MED ORDER — ENOXAPARIN SODIUM 120 MG/0.8ML IJ SOSY: 120.0000 mg | PREFILLED_SYRINGE | Freq: Two times a day (BID) | INTRAMUSCULAR | 0 refills | Status: DC

## 2022-01-01 NOTE — Telephone Encounter (Signed)
I spoke to Dr.Yu of Oncology and they are seeing him for recurrent DVT. He is currently on Xarelto, but they want him to be on Coumadin long term. They are stopping the Xarelto today and starting him on a 7 day Lovenox bridge. He needs to see the Coumadin clinic to be started on Coumadin THIS WEEK. I have referred him to the Coumadin Clinic urgently  ?

## 2022-01-01 NOTE — Addendum Note (Signed)
Addended by: Earlie Server on: 01/01/2022 08:22 PM   Modules accepted: Orders

## 2022-01-01 NOTE — Telephone Encounter (Signed)
Message was sent to Dr Sarajane Jews for review ?

## 2022-01-02 ENCOUNTER — Other Ambulatory Visit: Payer: Self-pay

## 2022-01-02 LAB — CALR + JAK2 E12-15 + MPL (REFLEXED)

## 2022-01-02 LAB — JAK2 V617F, W REFLEX TO CALR/E12/MPL

## 2022-01-02 NOTE — Progress Notes (Signed)
Error

## 2022-01-02 NOTE — Telephone Encounter (Signed)
I spoke to Dr. Tasia Catchings yesterday. We agreed that he will stop Xarelto for recurrent DVT's and he will be started on a Lovenox bridge. I have referred him to the Coumadin Clinic to switch hi, permanently over to Coumadin.  ?

## 2022-01-04 ENCOUNTER — Other Ambulatory Visit: Payer: Self-pay | Admitting: Pharmacist

## 2022-01-04 ENCOUNTER — Encounter: Payer: Self-pay | Admitting: Internal Medicine

## 2022-01-04 ENCOUNTER — Other Ambulatory Visit: Payer: Self-pay

## 2022-01-04 ENCOUNTER — Ambulatory Visit: Payer: BC Managed Care – PPO | Admitting: Internal Medicine

## 2022-01-04 VITALS — BP 154/79 | HR 76 | Ht 75.0 in | Wt 300.2 lb

## 2022-01-04 DIAGNOSIS — I82411 Acute embolism and thrombosis of right femoral vein: Secondary | ICD-10-CM

## 2022-01-04 DIAGNOSIS — Z7901 Long term (current) use of anticoagulants: Secondary | ICD-10-CM | POA: Insufficient documentation

## 2022-01-04 DIAGNOSIS — I872 Venous insufficiency (chronic) (peripheral): Secondary | ICD-10-CM

## 2022-01-04 MED ORDER — WARFARIN SODIUM 5 MG PO TABS: 5.0000 mg | ORAL_TABLET | Freq: Every day | ORAL | 0 refills | Status: DC

## 2022-01-04 NOTE — Telephone Encounter (Signed)
Patient present today for new patient appointment with Dr Harl Bowie. Patient currently on Lovenox for DVT.  Referred to Coumadin clinic.  Patient has been on warfarin in the past. Lives in [place].  Will send in warfarin '5mg'$  once daily and patient to overlap with Lovenox until New coumadin appointment on Wednesday in [place].  Patient voiced understanding. ?

## 2022-01-04 NOTE — Telephone Encounter (Signed)
Done

## 2022-01-04 NOTE — Patient Instructions (Signed)
Medication Instructions:  ?PLEASE FOLLOW COUMADIN CLINIC INSTRUCTIONS ?*If you need a refill on your cardiac medications before your next appointment, please call your pharmacy* ? ?Follow-Up: ?At [facility], you and your health needs are our priority.  As part of our continuing mission to provide you with exceptional heart care, we have created designated Provider Care Teams.  These Care Teams include your primary Cardiologist (physician) and Advanced Practice Providers (APPs -  Physician Assistants and Nurse Practitioners) who all work together to provide you with the care you need, when you need it. ? ?Your next appointment:   ?3 month(s) ? ?The format for your next appointment:   ?In Person ? ?Provider:   ?Janina Mayo, MD   ? ?

## 2022-01-04 NOTE — Progress Notes (Signed)
?Cardiology Office Note:   ? ?Date:  01/04/2022  ? ?ID:  Christopher Marsh, DOB 10/31/1960, MRN [id number] ? ?PCP:  Laurey Morale, MD ?  ?[facility] Prov[facility] ?Cardiologist:  Janina Mayo, MD    ? ?Referring MD: Laurey Morale, MD  ? ?No chief complaint on file. ?Coumadin clinic referral ? ?History of Present Illness:   ? ?Christopher Marsh is a 61 y.o. male with a hx of T2DM, HTN, DVT ? ?He was referred to coumadin clinic. He has no hx of cardiac disease. Notes occasional LH with standing. He denies syncope. No vision changes. ? ?Sees hematology; however they don't have a coumadin clinic per patient.  He has no heart disease hx. He has not seen a cardiologist. He has remote hx of  smoking in high school /college. No family hx of heart disease. He plays golf and works in the yard. He denies chest pain. No dyspnea.  ? ?EKG 11/18/2017- NSR ? ?Past Medical History:  ?Diagnosis Date  ? Arthritis   ? Diabetes mellitus without complication ([place])   ? DVT (deep venous thrombosis) ([place])   ? Lt leg  ? Dyspnea   ? Elevated lipids   ? Hyperlipidemia   ? Hypertension   ? Lower extremity edema   ? ? ?Past Surgical History:  ?Procedure Laterality Date  ? COLONOSCOPY WITH PROPOFOL N/A 09/23/2018  ? Per Dr. Alice Reichert, polyp, repeat in 3 yrs   ? EYE MUSCLE SURGERY    ? JOINT REPLACEMENT Left 07/22/2017  ? TOTAL HIP ARTHROPLASTY Left 07/22/2017  ? Procedure: TOTAL HIP ARTHROPLASTY;  Surgeon: Dereck Leep, MD;  Location: [facility] ORS;  Service: Orthopedics;  Laterality: Left;  ? ? ?Current Medications: ?Current Meds  ?Medication Sig  ? albuterol (VENTOLIN HFA) 108 (90 Base) MCG/ACT inhaler Inhale 2 puffs into the lungs every 4 (four) hours as needed.  ? amLODipine (NORVASC) 5 MG tablet Take 1 tablet (5 mg total) by mouth daily.  ? enoxaparin (LOVENOX) 120 MG/0.8ML injection Inject 0.8 mLs (120 mg total) into the skin every 12 (twelve) hours.  ? glipiZIDE (GLUCOTROL) 10 MG tablet Take 1 tablet (10 mg total) by mouth 2 (two) times daily  before a meal.  ? glucose blood test strip 1 each by Other route as needed for other. accu chek aviva plusUse as instructed  ? ketoconazole (NIZORAL) 2 % cream APPLY TO AFFECTED AREA TWICE DAILY AS NEEDED FOR IRRITATION  ? metFORMIN (GLUCOPHAGE) 500 MG tablet TAKE 2 (TWO) TABLETS AT BREAKFAST  ? pravastatin (PRAVACHOL) 20 MG tablet Take 1 tablet (20 mg total) by mouth daily.  ? tadalafil (CIALIS) 20 MG tablet Take 0.5-1 tablets (10-20 mg total) by mouth every other day as needed for erectile dysfunction.  ? triamcinolone cream (KENALOG) 0.1 % Apply 1 application topically 3 (three) times daily.  ?  ? ?Allergies:   Patient has no known allergies.  ? ?Social History  ? ?Socioeconomic History  ? Marital status: Married  ?  Spouse name: Not on file  ? Number of children: Not on file  ? Years of education: Not on file  ? Highest education level: Not on file  ?Occupational History  ? Not on file  ?Tobacco Use  ? Smoking status: Former  ?  Packs/day: 0.50  ?  Years: 6.00  ?  Pack years: 3.00  ?  Types: Cigarettes  ?  Quit date: 37  ?  Years since quitting: 41.2  ? Smokeless tobacco: Never  ?  Vaping Use  ? Vaping Use: Never used  ?Substance and Sexual Activity  ? Alcohol use: Not Currently  ?  Comment: 2-3 nights a week  ? Drug use: No  ? Sexual activity: Not on file  ?Other Topics Concern  ? Not on file  ?Social History Narrative  ? Not on file  ? ?Social Determinants of Health  ? ?Financial Resource Strain: Not on file  ?Food Insecurity: Not on file  ?Transportation Needs: Not on file  ?Physical Activity: Not on file  ?Stress: Not on file  ?Social Connections: Not on file  ?  ? ?Family History: ?The patient's family history includes Breast cancer in his mother; Colon cancer in his maternal grandmother; Emphysema in his father. ? ?ROS:   ?Please see the history of present illness.    ? All other systems reviewed and are negative. ? ?EKGs/Labs/Other Studies Reviewed:   ? ?The following studies were reviewed  today: ? ? ?EKG:  EKG is  ordered today.  The ekg ordered today demonstrates  ? ?NSR, PVCs, PACs. IRRRB ? ?Recent Labs: ?06/06/2021: TSH 1.81 ?12/18/2021: ALT 21; BUN 9; Creatinine, Ser 0.74; Hemoglobin 17.7; Platelets 273; Potassium 3.9; Sodium 134  ?Recent Lipid Panel ?   ?Component Value Date/Time  ? CHOL 176 06/06/2021 1021  ? TRIG 69.0 06/06/2021 1021  ? HDL 70.80 06/06/2021 1021  ? CHOLHDL 2 06/06/2021 1021  ? VLDL 13.8 06/06/2021 1021  ? [place] 91 06/06/2021 1021  ? ? ? ?Risk Assessment/Calculations:   ?  ? ?    ? ?Physical Exam:   ? ?VS:  BP (!) 154/79   Pulse 76   Ht '6\' 3"'$  (1.905 m)   Wt (!) 300 lb 3.2 oz (136.2 kg)   SpO2 96%   BMI 37.52 kg/m?    ? ?Wt Readings from Last 3 Encounters:  ?01/04/22 (!) 300 lb 3.2 oz (136.2 kg)  ?12/18/21 (!) 300 lb 14.4 oz (136.5 kg)  ?12/06/21 297 lb (134.7 kg)  ?  ? ?GEN:  Well nourished, well developed in no acute distress ?HEENT: Normal ?NECK: No JVD; No carotid bruits ?LYMPHATICS: No lymphadenopathy ?CARDIAC: RRR, no murmurs, rubs, gallops ?RESPIRATORY:  Clear to auscultation without rales, wheezing or rhonchi  ?ABDOMEN: Soft, non-tender, non-distended ?MUSCULOSKELETAL:  No edema; No deformity  ?SKIN: Warm and dry ?NEUROLOGIC:  Alert and oriented x 3 ?PSYCHIATRIC:  Normal affect  ? ?ASSESSMENT:   ? ?DVT: no provoking factors. Saw heme for hypercoag work up. Negative w/u so far. Pending Lupus anticoagulant panel. He can follow here for bridging until he can be followed with coumadin clinic associated with PCP/heme ? ?[place]: no signs of stroke. No aortic stenosis on exam. Likely related to hydration. Recommended he stay hydrated. ? ?PLAN:   ? ?In order of problems listed above: ? ?Follow up 3 months [will change to as needed once he is set up with another coumadin clinic] ?Will work on discussing with his PCP to transfer coumadin clinics ? ?   ? ?   ? ? ?Medication Adjustments/Labs and Tests Ordered: ?Current medicines are reviewed at length with the patient  today.  Concerns regarding medicines are outlined above.  ?Orders Placed This Encounter  ?Procedures  ? EKG 12-Lead  ? ?No orders of the defined types were placed in this encounter. ? ? ?Patient Instructions  ?Medication Instructions:  ?PLEASE FOLLOW COUMADIN CLINIC INSTRUCTIONS ?*If you need a refill on your cardiac medications before your next appointment, please call your pharmacy* ? ?Follow-Up: ?  At [facility], you and your health needs are our priority.  As part of our continuing mission to provide you with exceptional heart care, we have created designated Provider Care Teams.  These Care Teams include your primary Cardiologist (physician) and Advanced Practice Providers (APPs -  Physician Assistants and Nurse Practitioners) who all work together to provide you with the care you need, when you need it. ? ?Your next appointment:   ?3 month(s) ? ?The format for your next appointment:   ?In Person ? ?Provider:   ?Janina Mayo, MD   ?  ? ?Signed, ?Janina Mayo, MD  ?01/04/2022 11:44 AM    ?[place] ?

## 2022-01-05 ENCOUNTER — Telehealth: Payer: Self-pay

## 2022-01-05 ENCOUNTER — Other Ambulatory Visit: Payer: Self-pay | Admitting: Oncology

## 2022-01-05 MED ORDER — ENOXAPARIN SODIUM 120 MG/0.8ML IJ SOSY: 120.0000 mg | PREFILLED_SYRINGE | Freq: Two times a day (BID) | INTRAMUSCULAR | 0 refills | Status: DC

## 2022-01-05 NOTE — Telephone Encounter (Signed)
Spoke to patient and he confirmed that he will be seeing coumadin clinic on Wednesday. Informed him that Dr. Tasia Catchings has sent a refill of lovenox rx for another 7 days. total 2 weeks supply. He will be overlapping coumading and lovenox for a few days. He verbalized understanding and states that he was on his way to pick up coumadin today.  ?

## 2022-01-08 ENCOUNTER — Telehealth: Payer: Self-pay

## 2022-01-08 NOTE — Telephone Encounter (Signed)
Sounds great.  Thank you. 

## 2022-01-08 NOTE — Telephone Encounter (Signed)
Received msg from PCP reporting pt is to be transitioned from lovenox to warfarin. Did not receive the msg until today. Upon review of pts chart, cardiology has also been involved concerning starting him on warfarin. Not sure if he will be taken care of by cardiology or PCP for warfarin. ? ?Contacted pt who reports being upset with the entire process. He reports he saw hematology and they said to transition to lovenox and then warfarin after he has only been on xarelto for one day and paid over $100 for it. He reports he was sent to cardiology and the provider asked him why he was there since he did not have any heart problems. He felt that apt was a waste of time. He reports he was told there was a coumadin clinic in [place] but then he was told there wasn't. He reports being confused and frustrated and thinks "there are too many hands in the pot." Pt also reported he will most likely not be going back to cardiology. ? ?Explained to pt concerning the coumadin clinic in cardiology that has coumadin clinic in [place] once weekly and the coumadin clinics within [facility] locations.  ?Pt currently has an apt with coumadin clinic at [facility] that is managed by cardiology on 3/15. Pt is currently taking lovenox and would like to get off of that as soon as possible. He would like to keep that apt right now and talk to the nurse there about transferring to primary care coumadin clinic. Advised pt this nurse would be available if any further questions or concerned and if this nurse did not hear from him by the end of the week that this nurse would contact him. Pt appreciative and reports he will f/u after his coumadin clinic apt on 3/15. Pt verbalized understanding and was appreciative for the call.  ?

## 2022-01-08 NOTE — Telephone Encounter (Signed)
Patient enrolled in anticoagulation therapy on Thursday January 04, 2022 ?Received: 4 days ago ?Laurey Morale, MD  [facility] ?Phone Number: [phone]  ? ?Christopher Marsh, Christopher Marsh was enrolled for anticoagulation therapy on Thursday January 04, 2022.   ?  ?   ?Anticoagulation Episode Summary ? ?Current INR goal:    ?Next INR check:    ?INR from last check:    ?Weekly max warfarin dose:    ?Target end date:    ?INR check location:    ?Preferred lab:    ?Send INR reminders to:  [facility]  ? Indications ? ?Anticoagulant long-term use [Z79.01]  ?Comments:    ?  ? ?Anticoagulation Care Providers ? ?Provider Role Specialty Phone number  ?Laurey Morale, MD Referring Family Medicine [phone]  ? ?

## 2022-01-10 ENCOUNTER — Ambulatory Visit (INDEPENDENT_AMBULATORY_CARE_PROVIDER_SITE_OTHER): Payer: BC Managed Care – PPO

## 2022-01-10 ENCOUNTER — Other Ambulatory Visit: Payer: Self-pay

## 2022-01-10 DIAGNOSIS — I82411 Acute embolism and thrombosis of right femoral vein: Secondary | ICD-10-CM | POA: Diagnosis not present

## 2022-01-10 DIAGNOSIS — Z5181 Encounter for therapeutic drug level monitoring: Secondary | ICD-10-CM

## 2022-01-10 DIAGNOSIS — Z7901 Long term (current) use of anticoagulants: Secondary | ICD-10-CM | POA: Diagnosis not present

## 2022-01-10 LAB — POCT INR: INR: 1.4 — AB (ref 2.0–3.0)

## 2022-01-10 MED ORDER — ENOXAPARIN SODIUM 120 MG/0.8ML IJ SOSY: 120.0000 mg | PREFILLED_SYRINGE | Freq: Two times a day (BID) | INTRAMUSCULAR | 0 refills | Status: DC

## 2022-01-10 NOTE — Patient Instructions (Signed)
-   continue Lovenox injections ?- take 1.5 tablets warfarin tonight and tomorrow, then ?- START NEW DOSAGE of warfarin 1 tablet every day EXCEPT 1.5 tablets on SATURDAYS AND WEDNESDAYS ?- Recheck INR next Thursday w/ Larene Beach in [place] ?

## 2022-01-10 NOTE — Progress Notes (Signed)
A full discussion of the nature of anticoagulants has been carried out.  A benefit risk analysis has been presented to the patient, so that they understand the justification for choosing anticoagulation at this time. The need for frequent and regular monitoring, precise dosage adjustment and compliance is stressed.  Side effects of potential bleeding are discussed.  The patient should avoid any OTC items containing aspirin or ibuprofen, and should avoid great swings in general diet.  Avoid alcohol consumption.  Call if any signs of abnormal bleeding.  Next PT/INR test in 1 week. ? ?Pt would like to f/u at PCP's coumadin clinic.  Pt sheduled to see Larene Beach @ [place] location next Thursday.  She offered him Monday @ [place], but pt lives in [place] and would prefer the [place] location. Pt verbalizes understanding that he will need to continue Lovenox shots until that appt.  ?

## 2022-01-11 NOTE — Telephone Encounter (Signed)
Discussed case with coumadin nurse in cardiology. Pt had INR of 1.4 yesterday so he will remain on lovenox and has had a new dosing schedule provided for warfarin. Pt will have apt with [place] coumadin clinic at [facility] on 3/23.  ?

## 2022-01-16 ENCOUNTER — Other Ambulatory Visit (INDEPENDENT_AMBULATORY_CARE_PROVIDER_SITE_OTHER): Payer: Self-pay | Admitting: Nurse Practitioner

## 2022-01-16 DIAGNOSIS — I82411 Acute embolism and thrombosis of right femoral vein: Secondary | ICD-10-CM

## 2022-01-17 ENCOUNTER — Other Ambulatory Visit: Payer: Self-pay

## 2022-01-17 ENCOUNTER — Encounter (INDEPENDENT_AMBULATORY_CARE_PROVIDER_SITE_OTHER): Payer: Self-pay | Admitting: Nurse Practitioner

## 2022-01-17 ENCOUNTER — Ambulatory Visit (INDEPENDENT_AMBULATORY_CARE_PROVIDER_SITE_OTHER): Payer: BC Managed Care – PPO

## 2022-01-17 ENCOUNTER — Ambulatory Visit (INDEPENDENT_AMBULATORY_CARE_PROVIDER_SITE_OTHER): Payer: BC Managed Care – PPO | Admitting: Nurse Practitioner

## 2022-01-17 VITALS — BP 147/76 | HR 80 | Resp 16 | Ht 75.0 in | Wt 292.8 lb

## 2022-01-17 DIAGNOSIS — E7800 Pure hypercholesterolemia, unspecified: Secondary | ICD-10-CM | POA: Diagnosis not present

## 2022-01-17 DIAGNOSIS — I82411 Acute embolism and thrombosis of right femoral vein: Secondary | ICD-10-CM

## 2022-01-18 ENCOUNTER — Ambulatory Visit (INDEPENDENT_AMBULATORY_CARE_PROVIDER_SITE_OTHER): Payer: BC Managed Care – PPO

## 2022-01-18 DIAGNOSIS — I82411 Acute embolism and thrombosis of right femoral vein: Secondary | ICD-10-CM

## 2022-01-18 DIAGNOSIS — I872 Venous insufficiency (chronic) (peripheral): Secondary | ICD-10-CM

## 2022-01-18 DIAGNOSIS — Z7901 Long term (current) use of anticoagulants: Secondary | ICD-10-CM | POA: Diagnosis not present

## 2022-01-18 LAB — POCT INR: INR: 2.9 (ref 2.0–3.0)

## 2022-01-18 MED ORDER — WARFARIN SODIUM 5 MG PO TABS: ORAL_TABLET | ORAL | 1 refills | Status: DC

## 2022-01-18 NOTE — Progress Notes (Signed)
Continue 1 tablet daily except take 1 1/2 tablets on Sundays and Wednesdays. Recheck in 1 week. Stop all lovenox. ?

## 2022-01-18 NOTE — Patient Instructions (Addendum)
Pre visit review using our clinic review tool, if applicable. No additional management support is needed unless otherwise documented below in the visit note. ? ?Continue 1 tablet daily except take 1 1/2 tablets on Sundays and Wednesdays. Recheck in 1 week. Stop all lovenox. ?

## 2022-01-22 ENCOUNTER — Encounter (INDEPENDENT_AMBULATORY_CARE_PROVIDER_SITE_OTHER): Payer: Self-pay | Admitting: Nurse Practitioner

## 2022-01-22 NOTE — Progress Notes (Signed)
? ?Subjective:  ? ? Patient ID: Christopher Marsh, male    DOB: 1961-10-28, 61 y.o.   MRN: [id number] ?Chief Complaint  ?Patient presents with  ? Follow-up  ?  ultrasound  ? ? ?Christopher Marsh is a 61 year old male that presents today for evaluation after subsequent discovery of a DVT on 12/03/2021.  Since his most recent follow-up he notes that the swelling continues in the right but is improved from previous.  The pain is also much better.  He also underwent evaluation by hematology shortly after seeing our office and the patient initially tested positive for antiphospholipid syndrome.  Due to this his anticoagulation was transitioned to Coumadin.  He he is on Lovenox shots to become therapeutic.  He has follow-up testing for this with hematology.  He has previously had a DVT in his left lower extremity approximately 2 decades ago.  The patient has been wearing medical grade compression socks since his most recent visit on most days.  He also elevates when possible. ? ?Today noninvasive studies show evidence of thrombus in the right lower extremity popliteal and posterior tibial vein.  This is drastically improved from the previous studies.  This thrombus is also chronic and he has gone from an occlusive clot to a partially recannulized. ? ? ? ?Review of Systems  ?Cardiovascular:  Positive for leg swelling.  ?All other systems reviewed and are negative. ? ?   ?Objective:  ? Physical Exam ?Vitals reviewed.  ?HENT:  ?   Head: Normocephalic.  ?Cardiovascular:  ?   Rate and Rhythm: Normal rate.  ?   Pulses: Normal pulses.  ?Pulmonary:  ?   Effort: Pulmonary effort is normal.  ?Musculoskeletal:  ?   Right lower leg: 1+ Edema present.  ?   Left lower leg: 2+ Edema present.  ?Skin: ?   General: Skin is warm and dry.  ?   Capillary Refill: Capillary refill takes less than 2 seconds.  ?Neurological:  ?   Mental Status: He is alert and oriented to person, place, and time.  ?Psychiatric:     ?   Mood and Affect: Mood normal.     ?    Behavior: Behavior normal.     ?   Thought Content: Thought content normal.     ?   Judgment: Judgment normal.  ? ? ?BP (!) 147/76 (BP Location: Left Arm)   Pulse 80   Resp 16   Ht '6\' 3"'$  (1.905 m)   Wt 292 lb 12.8 oz (132.8 kg)   BMI 36.60 kg/m?  ? ?Past Medical History:  ?Diagnosis Date  ? Arthritis   ? Diabetes mellitus without complication ([place])   ? DVT (deep venous thrombosis) ([place])   ? Lt leg  ? Dyspnea   ? Elevated lipids   ? Hyperlipidemia   ? Hypertension   ? Lower extremity edema   ? ? ?Social History  ? ?Socioeconomic History  ? Marital status: Married  ?  Spouse name: Not on file  ? Number of children: Not on file  ? Years of education: Not on file  ? Highest education level: Not on file  ?Occupational History  ? Not on file  ?Tobacco Use  ? Smoking status: Former  ?  Packs/day: 0.50  ?  Years: 6.00  ?  Pack years: 3.00  ?  Types: Cigarettes  ?  Quit date: 13  ?  Years since quitting: 41.2  ? Smokeless tobacco: Never  ?Vaping Use  ? Vaping  Use: Never used  ?Substance and Sexual Activity  ? Alcohol use: Not Currently  ?  Comment: 2-3 nights a week  ? Drug use: No  ? Sexual activity: Not on file  ?Other Topics Concern  ? Not on file  ?Social History Narrative  ? Not on file  ? ?Social Determinants of Health  ? ?Financial Resource Strain: Not on file  ?Food Insecurity: Not on file  ?Transportation Needs: Not on file  ?Physical Activity: Not on file  ?Stress: Not on file  ?Social Connections: Not on file  ?Intimate Partner Violence: Not on file  ? ? ?Past Surgical History:  ?Procedure Laterality Date  ? COLONOSCOPY WITH PROPOFOL N/A 09/23/2018  ? Per Dr. Alice Reichert, polyp, repeat in 3 yrs   ? EYE MUSCLE SURGERY    ? JOINT REPLACEMENT Left 07/22/2017  ? TOTAL HIP ARTHROPLASTY Left 07/22/2017  ? Procedure: TOTAL HIP ARTHROPLASTY;  Surgeon: Dereck Leep, MD;  Location: [facility] ORS;  Service: Orthopedics;  Laterality: Left;  ? ? ?Family History  ?Problem Relation Age of Onset  ? Breast cancer Mother   ?  Emphysema Father   ? Colon cancer Maternal Grandmother   ? ? ?No Known Allergies ? ? ?  Latest Ref Rng & Units 12/18/2021  ? 10:15 AM 06/06/2021  ? 10:21 AM 09/09/2019  ? 10:40 AM  ?CBC  ?WBC 4.0 - 10.5 K/uL 5.8   5.1   4.8    ?Hemoglobin 13.0 - 17.0 g/dL 17.7   16.9   17.0    ?Hematocrit 39.0 - 52.0 % 52.1   50.7   51.0    ?Platelets 150 - 400 K/uL 273   231.0   229.0    ? ? ? ? ?CMP  ?   ?Component Value Date/Time  ? NA 134 (L) 12/18/2021 1015  ? K 3.9 12/18/2021 1015  ? CL 102 12/18/2021 1015  ? CO2 25 12/18/2021 1015  ? GLUCOSE 138 (H) 12/18/2021 1015  ? BUN 9 12/18/2021 1015  ? CREATININE 0.74 12/18/2021 1015  ? CALCIUM 8.4 (L) 12/18/2021 1015  ? PROT 6.5 12/18/2021 1015  ? ALBUMIN 3.6 12/18/2021 1015  ? AST 20 12/18/2021 1015  ? ALT 21 12/18/2021 1015  ? ALKPHOS 57 12/18/2021 1015  ? BILITOT 0.6 12/18/2021 1015  ? GFRNONAA >60 12/18/2021 1015  ? GFRAA >60 07/24/2017 0439  ? ? ? ?No results found. ? ?   ?Assessment & Plan:  ? ?1. Acute deep vein thrombosis (DVT) of femoral vein of right lower extremity (HCC) ?We will have the patient return in 3 months for evaluation of lower extremity edema.  Patient is advised to continue to utilize his medical grade compression stockings.  He should also elevate when possible as well as engage in activity 3 to 4 days a week.  Patient has continued swelling lymphedema pump may be helpful for him.  He will continue to follow with hematology for guidance on anticoagulation. ? ?2. Pure hypercholesterolemia ?Continue statin as ordered and reviewed, no changes at this time  ? ? ?Current Outpatient Medications on File Prior to Visit  ?Medication Sig Dispense Refill  ? albuterol (VENTOLIN HFA) 108 (90 Base) MCG/ACT inhaler Inhale 2 puffs into the lungs every 4 (four) hours as needed. 18 g 0  ? amLODipine (NORVASC) 5 MG tablet Take 1 tablet (5 mg total) by mouth daily. 90 tablet 1  ? enoxaparin (LOVENOX) 120 MG/0.8ML injection Inject 0.8 mLs (120 mg total) into the skin every 12  (  twelve) hours. 14 mL 0  ? glipiZIDE (GLUCOTROL) 10 MG tablet Take 1 tablet (10 mg total) by mouth 2 (two) times daily before a meal. 180 tablet 1  ? glucose blood test strip 1 each by Other route as needed for other. accu chek aviva plusUse as instructed    ? ketoconazole (NIZORAL) 2 % cream APPLY TO AFFECTED AREA TWICE DAILY AS NEEDED FOR IRRITATION 30 g 1  ? metFORMIN (GLUCOPHAGE) 500 MG tablet TAKE 2 (TWO) TABLETS AT BREAKFAST 180 tablet 3  ? pravastatin (PRAVACHOL) 20 MG tablet Take 1 tablet (20 mg total) by mouth daily. 90 tablet 1  ? tadalafil (CIALIS) 20 MG tablet Take 0.5-1 tablets (10-20 mg total) by mouth every other day as needed for erectile dysfunction. 20 tablet 11  ? triamcinolone cream (KENALOG) 0.1 % Apply 1 application topically 3 (three) times daily. 453.6 g 5  ? ?No current facility-administered medications on file prior to visit.  ? ? ?There are no Patient Instructions on file for this visit. ?No follow-ups on file. ? ? ?Kris Hartmann, NP ? ? ?

## 2022-01-25 ENCOUNTER — Ambulatory Visit (INDEPENDENT_AMBULATORY_CARE_PROVIDER_SITE_OTHER): Payer: BC Managed Care – PPO

## 2022-01-25 DIAGNOSIS — Z7901 Long term (current) use of anticoagulants: Secondary | ICD-10-CM

## 2022-01-25 DIAGNOSIS — I872 Venous insufficiency (chronic) (peripheral): Secondary | ICD-10-CM

## 2022-01-25 DIAGNOSIS — I82411 Acute embolism and thrombosis of right femoral vein: Secondary | ICD-10-CM

## 2022-01-25 LAB — POCT INR: INR: 2.2 (ref 2.0–3.0)

## 2022-01-25 MED ORDER — WARFARIN SODIUM 5 MG PO TABS: ORAL_TABLET | ORAL | 2 refills | Status: DC

## 2022-01-25 NOTE — Progress Notes (Signed)
Continue 1 tablet daily except take 1 1/2 tablets on Sundays and Wednesdays. Recheck in 1 week.  ?

## 2022-01-25 NOTE — Patient Instructions (Addendum)
Pre visit review using our clinic review tool, if applicable. No additional management support is needed unless otherwise documented below in the visit note. ? ?Continue 1 tablet daily except take 1 1/2 tablets on Sundays and Wednesdays. Recheck in 1 week.  ?

## 2022-02-01 ENCOUNTER — Encounter: Payer: Self-pay | Admitting: Family Medicine

## 2022-02-01 ENCOUNTER — Other Ambulatory Visit: Payer: Self-pay

## 2022-02-01 ENCOUNTER — Ambulatory Visit (INDEPENDENT_AMBULATORY_CARE_PROVIDER_SITE_OTHER): Payer: BC Managed Care – PPO

## 2022-02-01 DIAGNOSIS — R053 Chronic cough: Secondary | ICD-10-CM

## 2022-02-01 DIAGNOSIS — Z7901 Long term (current) use of anticoagulants: Secondary | ICD-10-CM | POA: Diagnosis not present

## 2022-02-01 LAB — POCT INR: INR: 2.5 (ref 2.0–3.0)

## 2022-02-01 MED ORDER — ALBUTEROL SULFATE HFA 108 (90 BASE) MCG/ACT IN AERS: 2.0000 | INHALATION_SPRAY | RESPIRATORY_TRACT | 1 refills | Status: DC | PRN

## 2022-02-01 NOTE — Patient Instructions (Addendum)
Pre visit review using our clinic review tool, if applicable. No additional management support is needed unless otherwise documented below in the visit note. ? ?Continue 1 tablet daily except take 1 1/2 tablets on Sundays and Wednesdays. Recheck in 2 week.  ?

## 2022-02-01 NOTE — Progress Notes (Signed)
Continue 1 tablet daily except take 1 1/2 tablets on Sundays and Wednesdays. Recheck in 2 week.  ?

## 2022-02-11 ENCOUNTER — Encounter: Payer: Self-pay | Admitting: Family Medicine

## 2022-02-12 ENCOUNTER — Other Ambulatory Visit: Payer: Self-pay

## 2022-02-12 MED ORDER — METFORMIN HCL 500 MG PO TABS: ORAL_TABLET | ORAL | 0 refills | Status: DC

## 2022-02-15 ENCOUNTER — Ambulatory Visit (INDEPENDENT_AMBULATORY_CARE_PROVIDER_SITE_OTHER): Payer: BC Managed Care – PPO

## 2022-02-15 DIAGNOSIS — Z7901 Long term (current) use of anticoagulants: Secondary | ICD-10-CM | POA: Diagnosis not present

## 2022-02-15 LAB — POCT INR: INR: 2.3 (ref 2.0–3.0)

## 2022-02-15 NOTE — Progress Notes (Signed)
Continue 1 tablet daily except take 1 1/2 tablets on Sundays and Wednesdays. Recheck in 3 week.  ?

## 2022-02-15 NOTE — Patient Instructions (Addendum)
Pre visit review using our clinic review tool, if applicable. No additional management support is needed unless otherwise documented below in the visit note. ? ?Continue 1 tablet daily except take 1 1/2 tablets on Sundays and Wednesdays. Recheck in 3 week.  ? ?

## 2022-03-08 ENCOUNTER — Ambulatory Visit (INDEPENDENT_AMBULATORY_CARE_PROVIDER_SITE_OTHER): Payer: BC Managed Care – PPO

## 2022-03-08 DIAGNOSIS — Z7901 Long term (current) use of anticoagulants: Secondary | ICD-10-CM

## 2022-03-08 LAB — POCT INR: INR: 2.1 (ref 2.0–3.0)

## 2022-03-08 NOTE — Patient Instructions (Addendum)
Pre visit review using our clinic review tool, if applicable. No additional management support is needed unless otherwise documented below in the visit note. ? ?Continue 1 tablet daily except take 1 1/2 tablets on Sundays and Wednesdays. Recheck in 4 week.  ?

## 2022-03-08 NOTE — Progress Notes (Signed)
Continue 1 tablet daily except take 1 1/2 tablets on Sundays and Wednesdays. Recheck in 4 week.  ?

## 2022-03-15 ENCOUNTER — Encounter: Payer: Self-pay | Admitting: Oncology

## 2022-03-15 ENCOUNTER — Inpatient Hospital Stay: Payer: BC Managed Care – PPO | Admitting: Oncology

## 2022-03-15 ENCOUNTER — Inpatient Hospital Stay: Payer: BC Managed Care – PPO | Attending: Oncology

## 2022-03-15 VITALS — BP 150/75 | HR 72 | Temp 97.4°F | Wt 295.0 lb

## 2022-03-15 DIAGNOSIS — D751 Secondary polycythemia: Secondary | ICD-10-CM | POA: Diagnosis not present

## 2022-03-15 DIAGNOSIS — Z7901 Long term (current) use of anticoagulants: Secondary | ICD-10-CM | POA: Insufficient documentation

## 2022-03-15 DIAGNOSIS — I82441 Acute embolism and thrombosis of right tibial vein: Secondary | ICD-10-CM | POA: Insufficient documentation

## 2022-03-15 DIAGNOSIS — R760 Raised antibody titer: Secondary | ICD-10-CM | POA: Diagnosis not present

## 2022-03-15 DIAGNOSIS — D6862 Lupus anticoagulant syndrome: Secondary | ICD-10-CM | POA: Diagnosis not present

## 2022-03-15 DIAGNOSIS — I82411 Acute embolism and thrombosis of right femoral vein: Secondary | ICD-10-CM

## 2022-03-15 LAB — COMPREHENSIVE METABOLIC PANEL
ALT: 18 U/L (ref 0–44)
AST: 22 U/L (ref 15–41)
Albumin: 3.9 g/dL (ref 3.5–5.0)
Alkaline Phosphatase: 59 U/L (ref 38–126)
Anion gap: 8 (ref 5–15)
BUN: 7 mg/dL — ABNORMAL LOW (ref 8–23)
CO2: 28 mmol/L (ref 22–32)
Calcium: 8.5 mg/dL — ABNORMAL LOW (ref 8.9–10.3)
Chloride: 102 mmol/L (ref 98–111)
Creatinine, Ser: 0.8 mg/dL (ref 0.61–1.24)
GFR, Estimated: >60 mL/min (ref >60)
Glucose, Bld: 101 mg/dL — ABNORMAL HIGH (ref 70–99)
Potassium: 4.3 mmol/L (ref 3.5–5.1)
Sodium: 138 mmol/L (ref 135–145)
Total Bilirubin: 0.8 mg/dL (ref 0.3–1.2)
Total Protein: 6.6 g/dL (ref 6.5–8.1)

## 2022-03-15 LAB — CBC WITH DIFFERENTIAL/PLATELET
Abs Immature Granulocytes: 0.02 10*3/uL (ref 0.00–0.07)
Basophils Absolute: 0.1 10*3/uL (ref 0.0–0.1)
Basophils Relative: 1 %
Eosinophils Absolute: 0.1 10*3/uL (ref 0.0–0.5)
Eosinophils Relative: 1 %
HCT: 53.3 % — ABNORMAL HIGH (ref 39.0–52.0)
Hemoglobin: 17.4 g/dL — ABNORMAL HIGH (ref 13.0–17.0)
Immature Granulocytes: 0 %
Lymphocytes Relative: 10 %
Lymphs Abs: 0.5 10*3/uL — ABNORMAL LOW (ref 0.7–4.0)
MCH: 30.1 pg (ref 26.0–34.0)
MCHC: 32.6 g/dL (ref 30.0–36.0)
MCV: 92.2 fL (ref 80.0–100.0)
Monocytes Absolute: 0.6 10*3/uL (ref 0.1–1.0)
Monocytes Relative: 10 %
Neutro Abs: 4.1 10*3/uL (ref 1.7–7.7)
Neutrophils Relative %: 78 %
Platelets: 229 10*3/uL (ref 150–400)
RBC: 5.78 MIL/uL (ref 4.22–5.81)
RDW: 13.1 % (ref 11.5–15.5)
WBC: 5.3 10*3/uL (ref 4.0–10.5)
nRBC: 0 % (ref 0.0–0.2)

## 2022-03-15 NOTE — Progress Notes (Signed)
Hematology/Oncology Progress note [phone] [phone]            Patient Care Team: Laurey Morale, MD as PCP - General (Family Medicine) Harl Bowie Royetta Crochet, MD as PCP - Cardiology (Cardiology) Earlie Server, MD as Consulting Physician (Hematology and Oncology) Kris Hartmann, NP as Nurse Practitioner (Vascular Surgery)  REFERRING PROVIDER: Laurey Morale, MD  CHIEF COMPLAINTS/REASON FOR VISIT:  Follow up for right lower extremity DVT, positive lupus anticoagulants.   HISTORY OF PRESENTING ILLNESS:   Christopher Marsh is a  61 y.o.  male with PMH listed below was seen in consultation at the request of  Laurey Morale, MD  for evaluation of DVT  Patient reports remote history of left lower extremity DVT in 2002.  He was initiated on Lovenox and bridged to Coumadin.  Patient took warfarin for 2 years before anticoagulation was stopped. 12/03/2021, patient presented to emergency room for evaluation of right lower extremity pain and swelling for about a week.  Started on the inner side of right thigh and migrated to the right calf. + Associated with swelling.  Denies any recent injury, hospitalization, surgery.  He first noticed the symptoms after playing basketball with his grandson.   12/03/2021, right lower extremity ultrasound showed occlusive DVT extending from the mid aspect of the femoral vein through the imaged tibial vein.  Age-indeterminate. Patient was started on Xarelto. He was referred to establish care with vascular surgeon and was seen by [name] on 12/06/2021.  Shared decision was made not to proceed with embolectomy.  Continue anticoagulation. Patient was referred to hematology oncology for further evaluation.  Patient denies any family history of blood clots.  Denies any unintentional weight loss, fever or night sweats. 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   He sometimes stay in his car while waiting for next assessment appointment.  He reports the right lower extremity symptom has improved since the start of Xarelto.  No active bleeding events.  INTERVAL HISTORY Christopher Marsh is a 61 y.o. male who has above history reviewed by me today presents for follow up visit for management of right lower extremity DVT and positive lupus anticoagulant.  Patient's hypercoagulable work-up showed JAK2 V617F mutation negative, with reflex to other mutations CALR, MPL, JAK 2 Ex 12-15 mutations negative, negative anticardiolipin IgG and IgM antibodies, positive lupus anticoagulant, negative factor V Leiden mutation, negative prothrombin gene mutation, normal protein C activity, normal protein S antigen level. Patient was recommended to switch to Coumadin with INR goal of 2-3.  Currently he follows up with Coumadin clinic for Coumadin dosing.  Patient tolerates anticoagulation well.  Denies any bleeding events. Right lower extremity pain/swelling has improved.  He wears compression stocking for lower extremity edema.  Review of Systems  Constitutional:  Negative for appetite change, chills, fatigue and fever.  HENT:   Negative for hearing loss and voice change.   Eyes:  Negative for eye problems.  Respiratory:  Negative for chest tightness and cough.   Cardiovascular:  Negative for chest pain.  Gastrointestinal:  Negative for abdominal distention, abdominal pain and blood in stool.  Endocrine: Negative for hot flashes.  Genitourinary:  Negative for difficulty urinating and frequency.   Musculoskeletal:  Negative for arthralgias.  Skin:  Negative for itching and rash.  Neurological:  Negative for extremity weakness.  Hematological:  Negative for adenopathy.  Psychiatric/Behavioral:  Negative for confusion.  MEDICAL HISTORY:  Past Medical History:  Diagnosis Date   Arthritis    Diabetes mellitus without complication (HCC)    DVT (deep venous thrombosis)  (HCC)    Lt leg   Dyspnea    Elevated lipids    Hyperlipidemia    Hypertension    Lower extremity edema     SURGICAL HISTORY: Past Surgical History:  Procedure Laterality Date   COLONOSCOPY WITH PROPOFOL N/A 09/23/2018   Per Dr. Alice Reichert, polyp, repeat in 3 yrs    EYE MUSCLE SURGERY     JOINT REPLACEMENT Left 07/22/2017   TOTAL HIP ARTHROPLASTY Left 07/22/2017   Procedure: TOTAL HIP ARTHROPLASTY;  Surgeon: Dereck Leep, MD;  Location: [facility] ORS;  Service: Orthopedics;  Laterality: Left;    SOCIAL HISTORY: Social History   Socioeconomic History   Marital status: Married    Spouse name: Not on file   Number of children: Not on file   Years of education: Not on file   Highest education level: Not on file  Occupational History   Not on file  Tobacco Use   Smoking status: Former    Packs/day: 0.50    Years: 6.00    Pack years: 3.00    Types: Cigarettes    Quit date: 6    Years since quitting: 41.4   Smokeless tobacco: Never  Vaping Use   Vaping Use: Never used  Substance and Sexual Activity   Alcohol use: Not Currently    Comment: 2-3 nights a week   Drug use: No   Sexual activity: Not on file  Other Topics Concern   Not on file  Social History Narrative   Not on file   Social Determinants of Health   Financial Resource Strain: Not on file  Food Insecurity: Not on file  Transportation Needs: Not on file  Physical Activity: Not on file  Stress: Not on file  Social Connections: Not on file  Intimate Partner Violence: Not on file    FAMILY HISTORY: Family History  Problem Relation Age of Onset   Breast cancer Mother    Emphysema Father    Colon cancer Maternal Grandmother     ALLERGIES:  has No Known Allergies.  MEDICATIONS:  Current Outpatient Medications  Medication Sig Dispense Refill   albuterol (VENTOLIN HFA) 108 (90 Base) MCG/ACT inhaler Inhale 2 puffs into the lungs every 4 (four) hours as needed. 18 g 1   amLODipine (NORVASC) 5 MG tablet  Take 1 tablet (5 mg total) by mouth daily. 90 tablet 1   enoxaparin (LOVENOX) 120 MG/0.8ML injection Inject 0.8 mLs (120 mg total) into the skin every 12 (twelve) hours. 14 mL 0   glipiZIDE (GLUCOTROL) 10 MG tablet Take 1 tablet (10 mg total) by mouth 2 (two) times daily before a meal. 180 tablet 1   glucose blood test strip 1 each by Other route as needed for other. accu chek aviva plusUse as instructed     ketoconazole (NIZORAL) 2 % cream APPLY TO AFFECTED AREA TWICE DAILY AS NEEDED FOR IRRITATION 30 g 1   metFORMIN (GLUCOPHAGE) 500 MG tablet TAKE 2 (TWO) TABLETS AT BREAKFAST 180 tablet 0   pravastatin (PRAVACHOL) 20 MG tablet Take 1 tablet (20 mg total) by mouth daily. 90 tablet 1   tadalafil (CIALIS) 20 MG tablet Take 0.5-1 tablets (10-20 mg total) by mouth every other day as needed for erectile dysfunction. 20 tablet 11   triamcinolone cream (KENALOG) 0.1 % Apply 1 application topically 3 (  three) times daily. 453.6 g 5   warfarin (COUMADIN) 5 MG tablet TAKE 1 TABLET BY MOUTH DAILY EXCEPT TAKE 1 1/2 TABLETS ON SUNDAYS AND WEDNESDAYS OR AS DIRECTED BY ANTICOAGULATION CLINIC 45 tablet 2   No current facility-administered medications for this visit.     PHYSICAL EXAMINATION: ECOG PERFORMANCE STATUS: 0 - Asymptomatic Vitals:   03/15/22 1059  BP: (!) 150/75  Pulse: 72  Temp: (!) 97.4 F (36.3 C)   Filed Weights   03/15/22 1059  Weight: 295 lb (133.8 kg)    Physical Exam Constitutional:      General: He is not in acute distress. HENT:     Head: Normocephalic and atraumatic.  Eyes:     General: No scleral icterus. Cardiovascular:     Rate and Rhythm: Normal rate and regular rhythm.     Heart sounds: Normal heart sounds.  Pulmonary:     Effort: Pulmonary effort is normal. No respiratory distress.     Breath sounds: No wheezing.  Abdominal:     General: Bowel sounds are normal. There is no distension.     Palpations: Abdomen is soft.  Musculoskeletal:        General: Swelling  present. No deformity. Normal range of motion.     Cervical back: Normal range of motion and neck supple.     Right lower leg: No edema.     Comments: Bilateral chronic lower extremity swelling, varicose veins  Skin:    General: Skin is warm and dry.     Findings: No erythema or rash.  Neurological:     Mental Status: He is alert and oriented to person, place, and time. Mental status is at baseline.     Cranial Nerves: No cranial nerve deficit.     Coordination: Coordination normal.  Psychiatric:        Mood and Affect: Mood normal.    LABORATORY DATA:  I have reviewed the data as listed Lab Results  Component Value Date   WBC 5.3 03/15/2022   HGB 17.4 (H) 03/15/2022   HCT 53.3 (H) 03/15/2022   MCV 92.2 03/15/2022   PLT 229 03/15/2022   Recent Labs    06/06/21 1021 12/18/21 1015 03/15/22 1035  NA 137 134* 138  K 4.8 3.9 4.3  CL 99 102 102  CO2 '30 25 28  '$ GLUCOSE 152* 138* 101*  BUN 8 9 7*  CREATININE 0.84 0.74 0.80  CALCIUM 9.2 8.4* 8.5*  GFRNONAA  --  >60 >60  PROT 6.5 6.5 6.6  ALBUMIN 4.2 3.6 3.9  AST '20 20 22  '$ ALT '27 21 18  '$ ALKPHOS 56 57 59  BILITOT 0.9 0.6 0.8  BILIDIR 0.2  --   --     Iron/TIBC/Ferritin/ %Sat No results found for: IRON, TIBC, FERRITIN, IRONPCTSAT    RADIOGRAPHIC STUDIES: I have personally reviewed the radiological images as listed and agreed with the findings in the report. No results found.    ASSESSMENT & PLAN:  1. DVT of deep femoral vein, right ([place])   2. Lupus anticoagulant positive   3. Erythrocytosis    #Recurrent lower extremity DVT, likely unprovoked due to lack of obvious immobilization triggers. Currently on Coumadin for anticoagulation and tolerates well. Continue follow-up with Coumadin clinic for Coumadin dosing.  #Positive Lupus anticoagulant Labs are being repeated and results are pending. If repeat lupus anticoagulant levels are positive, then patient is confirmed to have antiphospholipid syndrome and I  recommend patient to continue with Coumadin for long-term  anticoagulation. If negative, I plan to repeat lupus anticoagulant level at the next visit in 3 months.  Recommend age-appropriate cancer screening.  #Erythrocytosis, Hemoglobin 17.4, hematocrit 53.3, JAK2 V617F mutation negative, with reflex to other mutations CALR, MPL, JAK 2 Ex 12-15 mutations negative. BCR ABL 1 FISH negative.  Less likely primary bone marrow or production. Patient is a non-smoker, I recommend patient to discuss with primary care provider and recommend him to get sleep study.   Follow-up in 3 months.   Orders Placed This Encounter  Procedures   CBC with Differential/Platelet    Standing Status:   Future    Standing Expiration Date:   03/16/2023   Comprehensive metabolic panel    Standing Status:   Future    Standing Expiration Date:   03/16/2023    All questions were answered. The patient knows to call the clinic with any problems questions or concerns.  cc Laurey Morale, MD    Return of visit: 3 months   Earlie Server, MD, PhD [facility] Hematology Oncology 03/15/2022   Earlie Server

## 2022-03-16 LAB — LUPUS ANTICOAGULANT PANEL
DRVVT: 93 s — ABNORMAL HIGH (ref 0.0–47.0)
PTT Lupus Anticoagulant: 58.8 s — ABNORMAL HIGH (ref 0.0–43.5)

## 2022-03-16 LAB — DRVVT MIX: dRVVT Mix: 40.8 s — ABNORMAL HIGH (ref 0.0–40.4)

## 2022-03-16 LAB — HEXAGONAL PHASE PHOSPHOLIPID: Hexagonal Phase Phospholipid: 6 s (ref 0–11)

## 2022-03-16 LAB — DRVVT CONFIRM: dRVVT Confirm: 1.7 ratio — ABNORMAL HIGH (ref 0.8–1.2)

## 2022-03-16 LAB — PTT-LA MIX: PTT-LA Mix: 40.8 s — ABNORMAL HIGH (ref 0.0–40.5)

## 2022-04-05 ENCOUNTER — Ambulatory Visit (INDEPENDENT_AMBULATORY_CARE_PROVIDER_SITE_OTHER): Payer: BC Managed Care – PPO

## 2022-04-05 DIAGNOSIS — Z7901 Long term (current) use of anticoagulants: Secondary | ICD-10-CM

## 2022-04-05 LAB — POCT INR: INR: 2.8 (ref 2.0–3.0)

## 2022-04-05 NOTE — Patient Instructions (Addendum)
Pre visit review using our clinic review tool, if applicable. No additional management support is needed unless otherwise documented below in the visit note.  Continue 1 tablet daily except take 1 1/2 tablets on Sundays and Wednesdays. Recheck in 5 week at [place].

## 2022-04-05 NOTE — Progress Notes (Signed)
Continue 1 tablet daily except take 1 1/2 tablets on Sundays and Wednesdays. Recheck in 5 week.

## 2022-04-09 ENCOUNTER — Telehealth: Payer: Self-pay

## 2022-04-09 NOTE — Telephone Encounter (Signed)
Pt called to report he forgot dose of warfarin yesterday. Advised pt to add 1/2 tablet to his dose today and then resume normal dosing. Pt verbalized understanding.

## 2022-04-20 ENCOUNTER — Ambulatory Visit (INDEPENDENT_AMBULATORY_CARE_PROVIDER_SITE_OTHER): Payer: BC Managed Care – PPO | Admitting: Vascular Surgery

## 2022-04-20 ENCOUNTER — Encounter (INDEPENDENT_AMBULATORY_CARE_PROVIDER_SITE_OTHER): Payer: Self-pay | Admitting: Vascular Surgery

## 2022-04-20 VITALS — BP 150/74 | HR 73 | Resp 17 | Ht 75.0 in | Wt 297.0 lb

## 2022-04-20 DIAGNOSIS — I872 Venous insufficiency (chronic) (peripheral): Secondary | ICD-10-CM | POA: Diagnosis not present

## 2022-04-20 DIAGNOSIS — I82411 Acute embolism and thrombosis of right femoral vein: Secondary | ICD-10-CM

## 2022-04-20 DIAGNOSIS — I10 Essential (primary) hypertension: Secondary | ICD-10-CM

## 2022-04-20 DIAGNOSIS — I890 Lymphedema, not elsewhere classified: Secondary | ICD-10-CM

## 2022-04-20 NOTE — Assessment & Plan Note (Signed)
Has now had 2 unprovoked DVTs.  Sees hematology and is going to stay on Coumadin at this point.

## 2022-04-20 NOTE — Assessment & Plan Note (Signed)
blood pressure control important in reducing the progression of atherosclerotic disease. On appropriate oral medications.  

## 2022-04-20 NOTE — Progress Notes (Signed)
MRN : [id number]  Christopher Marsh is a 61 y.o. ([birth year]) male who presents with chief complaint of No chief complaint on file. Marland Kitchen  History of Present Illness: Patient returns today in follow up of his DVTs and leg swelling.  He is overall doing well.  He has been on Coumadin and is tolerating this.  His left leg has had chronic swelling since a DVT 20 years ago.  His right leg swelling is stable.  He does reliably wear compression socks and tries to elevate his legs but the swelling is still significant  Current Outpatient Medications  Medication Sig Dispense Refill   albuterol (VENTOLIN HFA) 108 (90 Base) MCG/ACT inhaler Inhale 2 puffs into the lungs every 4 (four) hours as needed. 18 g 1   amLODipine (NORVASC) 5 MG tablet Take 1 tablet (5 mg total) by mouth daily. 90 tablet 1   glipiZIDE (GLUCOTROL) 10 MG tablet Take 1 tablet (10 mg total) by mouth 2 (two) times daily before a meal. 180 tablet 1   glucose blood test strip 1 each by Other route as needed for other. accu chek aviva plusUse as instructed     ketoconazole (NIZORAL) 2 % cream APPLY TO AFFECTED AREA TWICE DAILY AS NEEDED FOR IRRITATION 30 g 1   metFORMIN (GLUCOPHAGE) 500 MG tablet TAKE 2 (TWO) TABLETS AT BREAKFAST 180 tablet 0   pravastatin (PRAVACHOL) 20 MG tablet Take 1 tablet (20 mg total) by mouth daily. 90 tablet 1   tadalafil (CIALIS) 20 MG tablet Take 0.5-1 tablets (10-20 mg total) by mouth every other day as needed for erectile dysfunction. 20 tablet 11   triamcinolone cream (KENALOG) 0.1 % Apply 1 application topically 3 (three) times daily. 453.6 g 5   warfarin (COUMADIN) 5 MG tablet TAKE 1 TABLET BY MOUTH DAILY EXCEPT TAKE 1 1/2 TABLETS ON SUNDAYS AND WEDNESDAYS OR AS DIRECTED BY ANTICOAGULATION CLINIC 45 tablet 2   enoxaparin (LOVENOX) 120 MG/0.8ML injection Inject 0.8 mLs (120 mg total) into the skin every 12 (twelve) hours. (Patient not taking: Reported on 04/20/2022) 14 mL 0   No current facility-administered  medications for this visit.    Past Medical History:  Diagnosis Date   Arthritis    Diabetes mellitus without complication (HCC)    DVT (deep venous thrombosis) (HCC)    Lt leg   Dyspnea    Elevated lipids    Hyperlipidemia    Hypertension    Lower extremity edema     Past Surgical History:  Procedure Laterality Date   COLONOSCOPY WITH PROPOFOL N/A 09/23/2018   Per Dr. Norma Fredrickson, polyp, repeat in 3 yrs    EYE MUSCLE SURGERY     JOINT REPLACEMENT Left 07/22/2017   TOTAL HIP ARTHROPLASTY Left 07/22/2017   Procedure: TOTAL HIP ARTHROPLASTY;  Surgeon: Donato Heinz, MD;  Location: [facility] ORS;  Service: Orthopedics;  Laterality: Left;     Social History   Tobacco Use   Smoking status: Former    Packs/day: 0.50    Years: 6.00    Total pack years: 3.00    Types: Cigarettes    Quit date: 1982    Years since quitting: 41.5   Smokeless tobacco: Never  Vaping Use   Vaping Use: Never used  Substance Use Topics   Alcohol use: Not Currently    Comment: 2-3 nights a week   Drug use: No       Family History  Problem Relation Age of Onset   Breast  cancer Mother    Emphysema Father    Colon cancer Maternal Grandmother      No Known Allergies   REVIEW OF SYSTEMS (Negative unless checked)  Constitutional: [] Weight loss  [] Fever  [] Chills Cardiac: [] Chest pain   [] Chest pressure   [] Palpitations   [] Shortness of breath when laying flat   [] Shortness of breath at rest   [] Shortness of breath with exertion. Vascular:  [] Pain in legs with walking   [] Pain in legs at rest   [] Pain in legs when laying flat   [] Claudication   [] Pain in feet when walking  [] Pain in feet at rest  [] Pain in feet when laying flat   [x] History of DVT   [] Phlebitis   [x] Swelling in legs   [] Varicose veins   [] Non-healing ulcers Pulmonary:   [] Uses home oxygen   [] Productive cough   [] Hemoptysis   [] Wheeze  [] COPD   [] Asthma Neurologic:  [] Dizziness  [] Blackouts   [] Seizures   [] History of stroke    [] History of TIA  [] Aphasia   [] Temporary blindness   [] Dysphagia   [] Weakness or numbness in arms   [] Weakness or numbness in legs Musculoskeletal:  [x] Arthritis   [] Joint swelling   [x] Joint pain   [] Low back pain Hematologic:  [] Easy bruising  [] Easy bleeding   [] Hypercoagulable state   [] Anemic   Gastrointestinal:  [] Blood in stool   [] Vomiting blood  [] Gastroesophageal reflux/heartburn   [] Abdominal pain Genitourinary:  [] Chronic kidney disease   [] Difficult urination  [] Frequent urination  [] Burning with urination   [] Hematuria Skin:  [] Rashes   [] Ulcers   [] Wounds Psychological:  [] History of anxiety   []  History of major depression.  Physical Examination  BP (!) 150/74 (BP Location: Right Arm)   Pulse 73   Resp 17   Ht 6\' 3"  (1.905 m)   Wt 297 lb (134.7 kg)   BMI 37.12 kg/m  Gen:  WD/WN, NAD Head: /AT, No temporalis wasting. Ear/Nose/Throat: Hearing grossly intact, nares w/o erythema or drainage Eyes: Conjunctiva clear. Sclera non-icteric Neck: Supple.  Trachea midline Pulmonary:  Good air movement, no use of accessory muscles.  Cardiac: RRR, no JVD Vascular:  Vessel Right Left  Radial Palpable Palpable                   Musculoskeletal: M/S 5/5 throughout.  No deformity or atrophy.  1-2+ right lower extremity edema, 2+ left lower extremity edema. Neurologic: Sensation grossly intact in extremities.  Symmetrical.  Speech is fluent.  Psychiatric: Judgment intact, Mood & affect appropriate for pt's clinical situation. Dermatologic: No rashes or ulcers noted.  No cellulitis or open wounds.      Labs Recent Results (from the past 2160 hour(s))  POCT INR     Status: None   Collection Time: 01/25/22 12:00 AM  Result Value Ref Range   INR 2.2 2.0 - 3.0  POCT INR     Status: None   Collection Time: 02/01/22 12:00 AM  Result Value Ref Range   INR 2.5 2.0 - 3.0  POCT INR     Status: None   Collection Time: 02/15/22 12:00 AM  Result Value Ref Range   INR 2.3 2.0 -  3.0  POCT INR     Status: None   Collection Time: 03/08/22 12:00 AM  Result Value Ref Range   INR 2.1 2.0 - 3.0  Lupus anticoagulant panel     Status: Abnormal   Collection Time: 03/15/22 10:35 AM  Result Value Ref Range  PTT Lupus Anticoagulant 58.8 (H) 0.0 - 43.5 sec   DRVVT 93.0 (H) 0.0 - 47.0 sec   Lupus Anticoag Interp Comment:     Comment: (NOTE) Results are consistent with the presence of a lupus anticoagulant. As only persistent lupus anticoagulant (LA) positivity meets laboratory diagnostic criteria for antiphospholipid syndrome, repeat testing in 12 or more weeks is recommended, ideally in the absence of anticoagulant therapy. Important Note: The results of LA testing are not valid for patients receiving heparin, direct Xa inhibitor (e.g., rivaroxaban, apixaban) or direct thrombin inhibitor (e.g., dabigatran) therapy. These drugs may cause false positive LA results but will not interfere with anticardiolipin and beta-2 glycoprotein 1 antibody testing. Performed At: [facility] [address] [place], [place] [id number] Jolene Schimke MD WU:[id number]   CBC with Differential/Platelet     Status: Abnormal   Collection Time: 03/15/22 10:35 AM  Result Value Ref Range   WBC 5.3 4.0 - 10.5 K/uL   RBC 5.78 4.22 - 5.81 MIL/uL   Hemoglobin 17.4 (H) 13.0 - 17.0 g/dL   HCT 95.6 (H) 21.3 - 08.6 %   MCV 92.2 80.0 - 100.0 fL   MCH 30.1 26.0 - 34.0 pg   MCHC 32.6 30.0 - 36.0 g/dL   RDW 57.8 46.9 - 62.9 %   Platelets 229 150 - 400 K/uL   nRBC 0.0 0.0 - 0.2 %   Neutrophils Relative % 78 %   Neutro Abs 4.1 1.7 - 7.7 K/uL   Lymphocytes Relative 10 %   Lymphs Abs 0.5 (L) 0.7 - 4.0 K/uL   Monocytes Relative 10 %   Monocytes Absolute 0.6 0.1 - 1.0 K/uL   Eosinophils Relative 1 %   Eosinophils Absolute 0.1 0.0 - 0.5 K/uL   Basophils Relative 1 %   Basophils Absolute 0.1 0.0 - 0.1 K/uL   Immature Granulocytes 0 %   Abs Immature Granulocytes 0.02 0.00 - 0.07 K/uL     Comment: Performed at [facility], [address] [place], [place] [address]  Comprehensive metabolic panel     Status: Abnormal   Collection Time: 03/15/22 10:35 AM  Result Value Ref Range   Sodium 138 135 - 145 mmol/L   Potassium 4.3 3.5 - 5.1 mmol/L   Chloride 102 98 - 111 mmol/L   CO2 28 22 - 32 mmol/L   Glucose, Bld 101 (H) 70 - 99 mg/dL    Comment: Glucose reference range applies only to samples taken after fasting for at least 8 hours.   BUN 7 (L) 8 - 23 mg/dL   Creatinine, Ser 3.24 0.61 - 1.24 mg/dL   Calcium 8.5 (L) 8.9 - 10.3 mg/dL   Total Protein 6.6 6.5 - 8.1 g/dL   Albumin 3.9 3.5 - 5.0 g/dL   AST 22 15 - 41 U/L   ALT 18 0 - 44 U/L   Alkaline Phosphatase 59 38 - 126 U/L   Total Bilirubin 0.8 0.3 - 1.2 mg/dL   GFR, Estimated >40 >10 mL/min    Comment: (NOTE) Calculated using the CKD-EPI Creatinine Equation (2021)    Anion gap 8 5 - 15    Comment: Performed at [facility], [address] [place], [place] [address]  PTT-LA Mix     Status: Abnormal   Collection Time: 03/15/22 10:35 AM  Result Value Ref Range   PTT-LA Mix 40.8 (H) 0.0 - 40.5 sec    Comment: (NOTE) Performed At: [facility] [address] [place], [place] [id number] Clovis Riley  Claudie Fisherman MD WU:[id number]   Hexagonal Phase Phospholipid     Status: None   Collection Time: 03/15/22 10:35 AM  Result Value Ref Range   Hexagonal Phase Phospholipid 6 0 - 11 sec    Comment: (NOTE) Performed At: [facility] [address] [place], [place] [id number] Jolene Schimke MD VH:[id number]   dRVVT Mix     Status: Abnormal   Collection Time: 03/15/22 10:35 AM  Result Value Ref Range   dRVVT Mix 40.8 (H) 0.0 - 40.4 sec    Comment: (NOTE) Performed At: [facility] [address] [place], [place] [id number] Jolene Schimke MD UV:[id number]   dRVVT Confirm     Status: Abnormal   Collection Time: 03/15/22 10:35 AM  Result Value Ref Range   dRVVT Confirm 1.7 (H) 0.8 - 1.2  ratio    Comment: (NOTE) Performed At: [facility] [address] [place], [place] [id number] Jolene Schimke MD VF:[id number]   POCT INR     Status: None   Collection Time: 04/05/22 12:00 AM  Result Value Ref Range   INR 2.8 2.0 - 3.0    Radiology No results found.  Assessment/Plan  DVT of deep femoral vein, right (HCC) Has now had 2 unprovoked DVTs.  Sees hematology and is going to stay on Coumadin at this point.  Essential hypertension blood pressure control important in reducing the progression of atherosclerotic disease. On appropriate oral medications.   Venous (peripheral) insufficiency Some degree of postphlebitic situation on both sides.  Compression socks, elevation, activity, and he is remaining on anticoagulation for his multiple previous DVTs.  We will try to get him a lymphedema pump which I do think would help the swelling as he has clearly developed stage II lymphedema with chronic scarring the lymphatic channels from long-term swelling.  Lymphedema We will try to get him a lymphedema pump which I do think would help the swelling as he has clearly developed stage II lymphedema with chronic scarring the lymphatic channels from long-term swelling.    Festus Barren, MD  04/20/2022 9:21 AM    This note was created with Dragon medical transcription system.  Any errors from dictation are purely unintentional

## 2022-04-20 NOTE — Assessment & Plan Note (Signed)
We will try to get him a lymphedema pump which I do think would help the swelling as he has clearly developed stage II lymphedema with chronic scarring the lymphatic channels from long-term swelling.

## 2022-05-09 ENCOUNTER — Ambulatory Visit (INDEPENDENT_AMBULATORY_CARE_PROVIDER_SITE_OTHER): Payer: BC Managed Care – PPO

## 2022-05-09 DIAGNOSIS — Z7901 Long term (current) use of anticoagulants: Secondary | ICD-10-CM

## 2022-05-09 DIAGNOSIS — I82411 Acute embolism and thrombosis of right femoral vein: Secondary | ICD-10-CM

## 2022-05-09 DIAGNOSIS — I872 Venous insufficiency (chronic) (peripheral): Secondary | ICD-10-CM

## 2022-05-09 LAB — POCT INR: INR: 2.2 (ref 2.0–3.0)

## 2022-05-09 MED ORDER — WARFARIN SODIUM 5 MG PO TABS: ORAL_TABLET | ORAL | 2 refills | Status: DC

## 2022-05-09 NOTE — Patient Instructions (Addendum)
Pre visit review using our clinic review tool, if applicable. No additional management support is needed unless otherwise documented below in the visit note.  Continue 1 tablet daily except take 1 1/2 tablets on Sundays and Wednesdays. Recheck in 6 week.

## 2022-05-09 NOTE — Progress Notes (Signed)
Continue 1 tablet daily except take 1 1/2 tablets on Sundays and Wednesdays. Recheck in 6 week.

## 2022-05-11 ENCOUNTER — Other Ambulatory Visit: Payer: Self-pay

## 2022-05-11 ENCOUNTER — Encounter: Payer: Self-pay | Admitting: Family Medicine

## 2022-05-11 DIAGNOSIS — R053 Chronic cough: Secondary | ICD-10-CM

## 2022-05-11 MED ORDER — ALBUTEROL SULFATE HFA 108 (90 BASE) MCG/ACT IN AERS: 2.0000 | INHALATION_SPRAY | RESPIRATORY_TRACT | 1 refills | Status: DC | PRN

## 2022-05-13 ENCOUNTER — Other Ambulatory Visit: Payer: Self-pay | Admitting: Family Medicine

## 2022-05-27 ENCOUNTER — Other Ambulatory Visit: Payer: Self-pay | Admitting: Family Medicine

## 2022-05-27 DIAGNOSIS — I10 Essential (primary) hypertension: Secondary | ICD-10-CM

## 2022-06-06 ENCOUNTER — Telehealth: Payer: Self-pay

## 2022-06-06 ENCOUNTER — Ambulatory Visit (INDEPENDENT_AMBULATORY_CARE_PROVIDER_SITE_OTHER): Payer: BC Managed Care – PPO

## 2022-06-06 DIAGNOSIS — Z7901 Long term (current) use of anticoagulants: Secondary | ICD-10-CM

## 2022-06-06 LAB — POCT INR: INR: 2.4 (ref 2.0–3.0)

## 2022-06-06 NOTE — Patient Instructions (Addendum)
Pre visit review using our clinic review tool, if applicable. No additional management support is needed unless otherwise documented below in the visit note.  Continue 1 tablet daily except take 1 1/2 tablets on Sundays and Wednesdays. Recheck in 6 week.

## 2022-06-06 NOTE — Progress Notes (Addendum)
Had a nose bleed yesterday and it stopped but as he blew his nose throughout the day there was blood but no active bleeding that he could tell. No other bleeding or abnormal bruising. This morning he blew his nose and there was a lot of blood but was able to stop the bleeding and is currently at work.  Advised if any further nosebleeds to contact office to make apt with PCP. Pt verbalized understanding.   Continue 1 tablet daily except take 1 1/2 tablets on Sundays and Wednesdays. Recheck in 6 week.

## 2022-06-06 NOTE — Telephone Encounter (Signed)
Pt LVM this morning that he had a nose bleed 2 days ago.  Contacted pt and he reports he was able to get it to stop bleeding on Monday. Had a nose bleed yesterday and it stopped but as he blew his nose throughout the day there was blood but no active bleeding that he could tell. No other bleeding or abnormal bruising. This morning he blew his nose and there was a lot of blood but was able to stop the bleeding and is currently at work. He will come into clinic this afternoon for INR check.   Placed pt on schedule for today. Advised if bleeding lasts longer than 10 minutes and he is unable to stop it in 10 minutes to go to the ER. Pt verbalized understanding.

## 2022-06-11 ENCOUNTER — Encounter: Payer: Self-pay | Admitting: Oncology

## 2022-06-11 ENCOUNTER — Inpatient Hospital Stay: Payer: BC Managed Care – PPO | Attending: Oncology

## 2022-06-11 ENCOUNTER — Inpatient Hospital Stay: Payer: BC Managed Care – PPO | Admitting: Oncology

## 2022-06-11 VITALS — BP 149/68 | HR 83 | Temp 98.1°F | Resp 18 | Wt 297.3 lb

## 2022-06-11 DIAGNOSIS — Z86718 Personal history of other venous thrombosis and embolism: Secondary | ICD-10-CM | POA: Insufficient documentation

## 2022-06-11 DIAGNOSIS — R040 Epistaxis: Secondary | ICD-10-CM

## 2022-06-11 DIAGNOSIS — Z7901 Long term (current) use of anticoagulants: Secondary | ICD-10-CM | POA: Diagnosis not present

## 2022-06-11 DIAGNOSIS — I82411 Acute embolism and thrombosis of right femoral vein: Secondary | ICD-10-CM

## 2022-06-11 DIAGNOSIS — D6861 Antiphospholipid syndrome: Secondary | ICD-10-CM | POA: Insufficient documentation

## 2022-06-11 DIAGNOSIS — R760 Raised antibody titer: Secondary | ICD-10-CM | POA: Diagnosis not present

## 2022-06-11 DIAGNOSIS — I87009 Postthrombotic syndrome without complications of unspecified extremity: Secondary | ICD-10-CM | POA: Diagnosis not present

## 2022-06-11 LAB — COMPREHENSIVE METABOLIC PANEL
ALT: 26 U/L (ref 0–44)
AST: 32 U/L (ref 15–41)
Albumin: 3.7 g/dL (ref 3.5–5.0)
Alkaline Phosphatase: 59 U/L (ref 38–126)
Anion gap: 10 (ref 5–15)
BUN: 10 mg/dL (ref 8–23)
CO2: 26 mmol/L (ref 22–32)
Calcium: 8.5 mg/dL — ABNORMAL LOW (ref 8.9–10.3)
Chloride: 99 mmol/L (ref 98–111)
Creatinine, Ser: 0.84 mg/dL (ref 0.61–1.24)
GFR, Estimated: >60 mL/min (ref >60)
Glucose, Bld: 176 mg/dL — ABNORMAL HIGH (ref 70–99)
Potassium: 4 mmol/L (ref 3.5–5.1)
Sodium: 135 mmol/L (ref 135–145)
Total Bilirubin: 0.5 mg/dL (ref 0.3–1.2)
Total Protein: 6.6 g/dL (ref 6.5–8.1)

## 2022-06-11 LAB — CBC WITH DIFFERENTIAL/PLATELET
Abs Immature Granulocytes: 0.01 10*3/uL (ref 0.00–0.07)
Basophils Absolute: 0.1 10*3/uL (ref 0.0–0.1)
Basophils Relative: 1 %
Eosinophils Absolute: 0.1 10*3/uL (ref 0.0–0.5)
Eosinophils Relative: 1 %
HCT: 48.4 % (ref 39.0–52.0)
Hemoglobin: 16.2 g/dL (ref 13.0–17.0)
Immature Granulocytes: 0 %
Lymphocytes Relative: 11 %
Lymphs Abs: 0.6 10*3/uL — ABNORMAL LOW (ref 0.7–4.0)
MCH: 31.6 pg (ref 26.0–34.0)
MCHC: 33.5 g/dL (ref 30.0–36.0)
MCV: 94.3 fL (ref 80.0–100.0)
Monocytes Absolute: 0.7 10*3/uL (ref 0.1–1.0)
Monocytes Relative: 12 %
Neutro Abs: 4.2 10*3/uL (ref 1.7–7.7)
Neutrophils Relative %: 75 %
Platelets: 189 10*3/uL (ref 150–400)
RBC: 5.13 MIL/uL (ref 4.22–5.81)
RDW: 13.2 % (ref 11.5–15.5)
WBC: 5.5 10*3/uL (ref 4.0–10.5)
nRBC: 0 % (ref 0.0–0.2)

## 2022-06-11 NOTE — Progress Notes (Signed)
Pt here for follow up. Pt reports that he had a skin tear to right shin and it bled a lot.

## 2022-06-11 NOTE — Progress Notes (Signed)
Hematology/Oncology Progress note [phone] [phone]            Patient Care Team: Laurey Morale, MD as PCP - General (Family Medicine) Harl Bowie Royetta Crochet, MD as PCP - Cardiology (Cardiology) Earlie Server, MD as Consulting Physician (Hematology and Oncology) Kris Hartmann, NP as Nurse Practitioner (Vascular Surgery)  REFERRING PROVIDER: Laurey Morale, MD  CHIEF COMPLAINTS/REASON FOR VISIT:  Follow up for right lower extremity DVT, positive lupus anticoagulants.   HISTORY OF PRESENTING ILLNESS:   Christopher Marsh is a  61 y.o.  male with PMH listed below was seen in consultation at the request of  Laurey Morale, MD  for evaluation of DVT  Patient reports remote history of left lower extremity DVT in 2002.  He was initiated on Lovenox and bridged to Coumadin.  Patient took warfarin for 2 years before anticoagulation was stopped. 12/03/2021, patient presented to emergency room for evaluation of right lower extremity pain and swelling for about a week.  Started on the inner side of right thigh and migrated to the right calf. + Associated with swelling.  Denies any recent injury, hospitalization, surgery.  He first noticed the symptoms after playing basketball with his grandson.   12/03/2021, right lower extremity ultrasound showed occlusive DVT extending from the mid aspect of the femoral vein through the imaged tibial vein.  Age-indeterminate. Patient was started on Xarelto. He was referred to establish care with vascular surgeon and was seen by [name] on 12/06/2021.  Shared decision was made not to proceed with embolectomy.  Continue anticoagulation. Patient was referred to hematology oncology for further evaluation.  Patient denies any family history of blood clots.  Denies any unintentional weight loss, fever or night sweats. 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   He sometimes stay in his car while waiting for next assessment appointment.  He reports the right lower extremity symptom has improved since the start of Xarelto.  No active bleeding events.  #12/18/2021 hypercoagulable work-up showed JAK2 V617F mutation negative, with reflex to other mutations CALR, MPL, JAK 2 Ex 12-15 mutations negative, negative anticardiolipin IgG and IgM antibodies, positive lupus anticoagulant, negative factor V Leiden mutation, negative prothrombin gene mutation, normal protein C activity, normal protein S antigen level. Patient was recommended to switch to Coumadin with INR goal of 2-3.  #03/15/2022, repeat lupus anticoagulant is persistently positive.  Compliant antiphospholipid syndrome.   INTERVAL HISTORY Christopher Marsh is a 61 y.o. male who has above history reviewed by me today presents for follow up visit for management of right lower extremity DVT and antiphospholipid syndrome.  Patient is currently on Coumadin, managed by Coumadin clinic.  He recently had 1 episode of epistaxis.  His INR was checked that day and was therapeutic at 2.4.  Recently he also had small cut on his right Marsh area at night.  He was not aware about bleeding but found good amount of blood in his bed when he woke up.  Currently he apply Neosporin on the wound. Easy bruising  Review of Systems  Constitutional:  Negative for appetite change, chills, fatigue and fever.  HENT:   Negative for hearing loss and voice change.   Eyes:  Negative for eye problems.  Respiratory:  Negative for chest tightness and cough.   Cardiovascular:  Negative for chest pain.  Gastrointestinal:  Negative for abdominal distention, abdominal pain and blood in  stool.  Endocrine: Negative for hot flashes.  Genitourinary:  Negative for difficulty urinating and frequency.   Musculoskeletal:  Negative for arthralgias.  Skin:  Negative for itching and rash.  Neurological:  Negative for extremity weakness.   Hematological:  Negative for adenopathy. Bruises/bleeds easily.  Psychiatric/Behavioral:  Negative for confusion.     MEDICAL HISTORY:  Past Medical History:  Diagnosis Date   Arthritis    Diabetes mellitus without complication (HCC)    DVT (deep venous thrombosis) (HCC)    Lt leg   Dyspnea    Elevated lipids    Hyperlipidemia    Hypertension    Lower extremity edema     SURGICAL HISTORY: Past Surgical History:  Procedure Laterality Date   COLONOSCOPY WITH PROPOFOL N/A 09/23/2018   Per Dr. Alice Reichert, polyp, repeat in 3 yrs    EYE MUSCLE SURGERY     JOINT REPLACEMENT Left 07/22/2017   TOTAL HIP ARTHROPLASTY Left 07/22/2017   Procedure: TOTAL HIP ARTHROPLASTY;  Surgeon: Dereck Leep, MD;  Location: [facility] ORS;  Service: Orthopedics;  Laterality: Left;    SOCIAL HISTORY: Social History   Socioeconomic History   Marital status: Married    Spouse name: Not on file   Number of children: Not on file   Years of education: Not on file   Highest education level: Not on file  Occupational History   Not on file  Tobacco Use   Smoking status: Former    Packs/day: 0.50    Years: 6.00    Total pack years: 3.00    Types: Cigarettes    Quit date: 59    Years since quitting: 41.6   Smokeless tobacco: Never  Vaping Use   Vaping Use: Never used  Substance and Sexual Activity   Alcohol use: Not Currently    Comment: 2-3 nights a week   Drug use: No   Sexual activity: Not on file  Other Topics Concern   Not on file  Social History Narrative   Not on file   Social Determinants of Health   Financial Resource Strain: Not on file  Food Insecurity: Not on file  Transportation Needs: Not on file  Physical Activity: Not on file  Stress: Not on file  Social Connections: Not on file  Intimate Partner Violence: Not on file    FAMILY HISTORY: Family History  Problem Relation Age of Onset   Breast cancer Mother    Emphysema Father    Colon cancer Maternal Grandmother      ALLERGIES:  has No Known Allergies.  MEDICATIONS:  Current Outpatient Medications  Medication Sig Dispense Refill   albuterol (VENTOLIN HFA) 108 (90 Base) MCG/ACT inhaler Inhale 2 puffs into the lungs every 4 (four) hours as needed. 18 g 1   amLODipine (NORVASC) 5 MG tablet Take 1 tablet (5 mg total) by mouth daily. *appointment required for greater quality of refills* 30 tablet 0   glipiZIDE (GLUCOTROL) 10 MG tablet Take 1 tablet (10 mg total) by mouth 2 (two) times daily before a meal. 180 tablet 1   glucose blood test strip 1 each by Other route as needed for other. accu chek aviva plusUse as instructed     ketoconazole (NIZORAL) 2 % cream APPLY TO AFFECTED AREA TWICE DAILY AS NEEDED FOR IRRITATION 30 g 1   metFORMIN (GLUCOPHAGE) 500 MG tablet TAKE TWO TABLETS BY MOUTH EVERY MORNING WITH BREAKFAST 180 tablet 0   pravastatin (PRAVACHOL) 20 MG tablet Take 1 tablet (20 mg total)  by mouth daily. 90 tablet 1   tadalafil (CIALIS) 20 MG tablet Take 0.5-1 tablets (10-20 mg total) by mouth every other day as needed for erectile dysfunction. 20 tablet 11   triamcinolone cream (KENALOG) 0.1 % Apply 1 application topically 3 (three) times daily. 453.6 g 5   warfarin (COUMADIN) 5 MG tablet TAKE 1 TABLET BY MOUTH DAILY EXCEPT TAKE 1 1/2 TABLETS ON SUNDAYS AND WEDNESDAYS OR AS DIRECTED BY ANTICOAGULATION CLINIC 135 tablet 2   No current facility-administered medications for this visit.     PHYSICAL EXAMINATION: ECOG PERFORMANCE STATUS: 0 - Asymptomatic Vitals:   06/11/22 1411  BP: (!) 149/68  Pulse: 83  Resp: 18  Temp: 98.1 F (36.7 C)   Filed Weights   06/11/22 1411  Weight: 297 lb 4.8 oz (134.9 kg)    Physical Exam Constitutional:      General: He is not in acute distress. HENT:     Head: Normocephalic and atraumatic.  Eyes:     General: No scleral icterus. Cardiovascular:     Rate and Rhythm: Normal rate and regular rhythm.     Heart sounds: Normal heart sounds.  Pulmonary:      Effort: Pulmonary effort is normal. No respiratory distress.     Breath sounds: No wheezing.  Abdominal:     General: Bowel sounds are normal. There is no distension.     Palpations: Abdomen is soft.  Musculoskeletal:        General: Swelling present. No deformity. Normal range of motion.     Cervical back: Normal range of motion and neck supple.     Right lower leg: No edema.     Comments: Bilateral chronic lower extremity swelling, varicose veins  Skin:    General: Skin is warm and dry.     Findings: No erythema or rash.  Neurological:     Mental Status: He is alert and oriented to person, place, and time. Mental status is at baseline.     Cranial Nerves: No cranial nerve deficit.     Coordination: Coordination normal.  Psychiatric:        Mood and Affect: Mood normal.     LABORATORY DATA:  I have reviewed the data as listed Lab Results  Component Value Date   WBC 5.5 06/11/2022   HGB 16.2 06/11/2022   HCT 48.4 06/11/2022   MCV 94.3 06/11/2022   PLT 189 06/11/2022   Recent Labs    12/18/21 1015 03/15/22 1035 06/11/22 1356  NA 134* 138 135  K 3.9 4.3 4.0  CL 102 102 99  CO2 '25 28 26  '$ GLUCOSE 138* 101* 176*  BUN 9 7* 10  CREATININE 0.74 0.80 0.84  CALCIUM 8.4* 8.5* 8.5*  GFRNONAA >60 >60 >60  PROT 6.5 6.6 6.6  ALBUMIN 3.6 3.9 3.7  AST 20 22 32  ALT '21 18 26  '$ ALKPHOS 57 59 59  BILITOT 0.6 0.8 0.5    Iron/TIBC/Ferritin/ %Sat No results found for: "IRON", "TIBC", "FERRITIN", "IRONPCTSAT"    RADIOGRAPHIC STUDIES: I have personally reviewed the radiological images as listed and agreed with the findings in the report. No results found.    ASSESSMENT & PLAN:  1. DVT of deep femoral vein, right ([place])   2. Lupus anticoagulant positive   3. Epistaxis   4. Post-thrombotic syndrome    #Recurrent lower extremity DVT, secondary to antiphospholipid syndrome.-.  Persistent lupus anticoagulant positive. Recommend life time anticoagulation with Coumadin with  INR goal between 2-3. Should  Coumadin clinic to continue to manage Coumadin dosing. , Patient to continue wearing compression stocking.  He has he has post thrombotic syndrome bilaterally.  #Epistaxis, recommend patient to continue monitor symptoms.  If he experiences recurrent epistaxis, recommend ENT evaluation for cauterization.  Recommend age-appropriate cancer screening.  #Erythrocytosis, JAK2 V617F mutation negative, with reflex to other mutations CALR, MPL, JAK 2 Ex 12-15 mutations negative. BCR ABL 1 FISH negative.  Less likely primary bone marrow or production. Hemoglobin improved today.   Follow-up in 6 months.   Orders Placed This Encounter  Procedures   CBC with Differential/Platelet    Standing Status:   Future    Standing Expiration Date:   06/12/2023   Comprehensive metabolic panel    Standing Status:   Future    Standing Expiration Date:   06/11/2023    All questions were answered. The patient knows to call the clinic with any problems questions or concerns.  cc Laurey Morale, MD  Malon Kindle, MD, PhD [facility] Hematology Oncology 06/11/2022   Earlie Server

## 2022-06-14 DIAGNOSIS — I890 Lymphedema, not elsewhere classified: Secondary | ICD-10-CM | POA: Diagnosis not present

## 2022-06-19 ENCOUNTER — Other Ambulatory Visit: Payer: Self-pay | Admitting: Family Medicine

## 2022-06-19 DIAGNOSIS — E7800 Pure hypercholesterolemia, unspecified: Secondary | ICD-10-CM

## 2022-06-20 ENCOUNTER — Ambulatory Visit: Payer: BC Managed Care – PPO

## 2022-07-01 ENCOUNTER — Other Ambulatory Visit: Payer: Self-pay | Admitting: Family Medicine

## 2022-07-01 DIAGNOSIS — I10 Essential (primary) hypertension: Secondary | ICD-10-CM

## 2022-07-03 ENCOUNTER — Other Ambulatory Visit: Payer: Self-pay

## 2022-07-03 MED ORDER — TRIAMCINOLONE ACETONIDE 0.1 % EX CREA: 1.0000 | TOPICAL_CREAM | Freq: Three times a day (TID) | CUTANEOUS | 1 refills | Status: DC

## 2022-07-15 DIAGNOSIS — I890 Lymphedema, not elsewhere classified: Secondary | ICD-10-CM | POA: Diagnosis not present

## 2022-07-16 ENCOUNTER — Other Ambulatory Visit: Payer: Self-pay | Admitting: Family Medicine

## 2022-07-16 DIAGNOSIS — E7800 Pure hypercholesterolemia, unspecified: Secondary | ICD-10-CM

## 2022-07-18 ENCOUNTER — Ambulatory Visit (INDEPENDENT_AMBULATORY_CARE_PROVIDER_SITE_OTHER): Payer: BC Managed Care – PPO

## 2022-07-18 DIAGNOSIS — Z7901 Long term (current) use of anticoagulants: Secondary | ICD-10-CM

## 2022-07-18 LAB — POCT INR: INR: 1.8 — AB (ref 2.0–3.0)

## 2022-07-18 NOTE — Progress Notes (Cosign Needed Addendum)
Pt missed larger dose last Sunday.  Increase dose today to take 2 1/2 tablets and then continue 1 tablet daily except take 1 1/2 tablets on Sundays and Wednesdays. Recheck in 3 week.

## 2022-07-18 NOTE — Patient Instructions (Addendum)
Pre visit review using our clinic review tool, if applicable. No additional management support is needed unless otherwise documented below in the visit note.  Increase dose today to take 2 1/2 tablets and then continue 1 tablet daily except take 1 1/2 tablets on Sundays and Wednesdays. Recheck in 3 week at apt with Dr. Sarajane Jews.

## 2022-07-19 ENCOUNTER — Ambulatory Visit (INDEPENDENT_AMBULATORY_CARE_PROVIDER_SITE_OTHER): Payer: BC Managed Care – PPO

## 2022-07-19 ENCOUNTER — Encounter: Payer: Self-pay | Admitting: Oncology

## 2022-07-19 ENCOUNTER — Ambulatory Visit
Admission: EM | Admit: 2022-07-19 | Discharge: 2022-07-19 | Disposition: A | Discharge status: Home / Self Care | Payer: BC Managed Care – PPO | Attending: Physician Assistant | Admitting: Physician Assistant

## 2022-07-19 DIAGNOSIS — I10 Essential (primary) hypertension: Secondary | ICD-10-CM | POA: Diagnosis not present

## 2022-07-19 DIAGNOSIS — R051 Acute cough: Secondary | ICD-10-CM

## 2022-07-19 DIAGNOSIS — R059 Cough, unspecified: Secondary | ICD-10-CM | POA: Diagnosis not present

## 2022-07-19 DIAGNOSIS — R0602 Shortness of breath: Secondary | ICD-10-CM

## 2022-07-19 DIAGNOSIS — R0902 Hypoxemia: Secondary | ICD-10-CM | POA: Diagnosis not present

## 2022-07-19 DIAGNOSIS — J189 Pneumonia, unspecified organism: Secondary | ICD-10-CM | POA: Diagnosis not present

## 2022-07-19 DIAGNOSIS — J439 Emphysema, unspecified: Secondary | ICD-10-CM | POA: Diagnosis not present

## 2022-07-19 MED ORDER — AZITHROMYCIN 250 MG PO TABS: 250.0000 mg | ORAL_TABLET | Freq: Every day | ORAL | 0 refills | Status: DC

## 2022-07-19 MED ORDER — BENZONATATE 100 MG PO CAPS: 100.0000 mg | ORAL_CAPSULE | Freq: Three times a day (TID) | ORAL | 0 refills | Status: DC

## 2022-07-19 MED ORDER — PROMETHAZINE-DM 6.25-15 MG/5ML PO SYRP: 5.0000 mL | ORAL_SOLUTION | Freq: Four times a day (QID) | ORAL | 0 refills | Status: DC | PRN

## 2022-07-19 MED ORDER — AMOXICILLIN-POT CLAVULANATE 875-125 MG PO TABS: 1.0000 | ORAL_TABLET | Freq: Two times a day (BID) | ORAL | 0 refills | Status: AC

## 2022-07-19 MED ORDER — BENZONATATE 100 MG PO CAPS
100.0000 mg | ORAL_CAPSULE | Freq: Three times a day (TID) | ORAL | 0 refills | Status: DC
Start: 2022-07-19 — End: 2022-07-19

## 2022-07-19 MED ORDER — AZITHROMYCIN 250 MG PO TABS
250.0000 mg | ORAL_TABLET | Freq: Every day | ORAL | 0 refills | Status: DC
Start: 2022-07-19 — End: 2022-07-19

## 2022-07-19 MED ORDER — AMOXICILLIN-POT CLAVULANATE 875-125 MG PO TABS: 1.0000 | ORAL_TABLET | Freq: Two times a day (BID) | ORAL | 0 refills | Status: DC

## 2022-07-19 MED ORDER — ALBUTEROL SULFATE (2.5 MG/3ML) 0.083% IN NEBU
2.5000 mg | INHALATION_SOLUTION | Freq: Once | RESPIRATORY_TRACT | Status: AC
  Administered 2022-07-19: 2.5 mg via RESPIRATORY_TRACT

## 2022-07-19 NOTE — ED Triage Notes (Signed)
Pt c/o headache, cough x8days  Pt wife was negative for covid.  Pt was around wife who got sick from [facility] and being around grandson.

## 2022-07-19 NOTE — Discharge Instructions (Addendum)
-  You have pneumonia. - I sent 2 different antibiotics to pharmacy.  Take them both I also sent cough medication to pharmacy. - We gave you a breathing treatment here.  Use your albuterol inhaler at home if needed for any shortness of breath. - Rest increase your fluid intake.  Keep your appoint with your PCP in the next couple weeks for recheck.  You will need a repeat x-ray. - If you develop fever, worsening cough, increased breathing difficulty need to go to the emergency department. - Let your prescriber know that you are on antibiotics because it may affect your INRs

## 2022-07-19 NOTE — ED Provider Notes (Signed)
[facility]    CSN: [id number] Arrival date & time: 07/19/22  1339      History   Chief Complaint Chief Complaint  Patient presents with   Headache    HPI Christopher Marsh is a 61 y.o. male presenting for headache and cough x8 days.  Denies fever.  Has had shortness of breath and fatigue.  Reports pain with coughing.  No nasal congestion, sinus pain, abdominal pain, nausea/vomiting or diarrhea.  Patient says he has been around his wife who was sick with similar symptoms.  She had a COVID test and it was negative.  Has been taking OTC cough medication without improvement in symptoms.  Patient has history of pneumonia.  States he had symptoms like this last year and had pneumonia.  He has symptoms like this year performed had COVID.  Medical history significant for diabetes, history of DVT and longtime anticoagulant use, hypertension, hyperlipidemia.  HPI  Past Medical History:  Diagnosis Date   Arthritis    Diabetes mellitus without complication ([place])    DVT (deep venous thrombosis) (H[facility])    Lt leg   Dyspnea    Elevated lipids    Hyperlipidemia    Hypertension    Lower extremity edema     Patient Active Problem List   Diagnosis Date Noted   Lymphedema 04/20/2022   Anticoagulant long-term use 01/04/2022   Erythrocytosis 12/18/2021   DVT of deep femoral vein, right ([place]) 12/18/2021   COVID-19 virus infection 09/01/2020   Status post total replacement of hip 07/22/2017   Essential hypertension 08/28/2016   Actinic keratosis 11/30/2015   Diabetes type 2, uncontrolled 02/15/2015   Morbid obesity ([place]) 06/18/2013   Left hip pain 04/02/2012   HSV (herpes simplex virus) infection 04/02/2012   CONTACT DERMATITIS&OTHER ECZEMA DUE UNSPEC CAUSE 09/14/2009   ERECTILE DYSFUNCTION 12/08/2008   Venous (peripheral) insufficiency 12/08/2008   HEPATITIS C, HX OF 12/08/2008   Hyperlipemia 11/24/2008   ESOPHAGITIS, REFLUX 11/18/2008    Past Surgical History:   Procedure Laterality Date   COLONOSCOPY WITH PROPOFOL N/A 09/23/2018   Per Dr. Alice Reichert, polyp, repeat in 3 yrs    EYE MUSCLE SURGERY     JOINT REPLACEMENT Left 07/22/2017   TOTAL HIP ARTHROPLASTY Left 07/22/2017   Procedure: TOTAL HIP ARTHROPLASTY;  Surgeon: Dereck Leep, MD;  Location: [facility] ORS;  Service: Orthopedics;  Laterality: Left;       Home Medications    Prior to Admission medications   Medication Sig Start Date End Date Taking? Authorizing Provider  albuterol (VENTOLIN HFA) 108 (90 Base) MCG/ACT inhaler Inhale 2 puffs into the lungs every 4 (four) hours as needed. 05/11/22  Yes Laurey Morale, MD  amLODipine (NORVASC) 5 MG tablet TAKE ONE TABLET BY MOUTH ONE TIME DAILY (APPOINTMENT REQUIRED FOR GREATER QUANTITY OF REFILLS) 07/03/22  Yes Laurey Morale, MD  amoxicillin-clavulanate (AUGMENTIN) 875-125 MG tablet Take 1 tablet by mouth every 12 (twelve) hours for 7 days. 07/19/22 07/26/22 Yes Danton Clap, PA-C  azithromycin (ZITHROMAX) 250 MG tablet Take 1 tablet (250 mg total) by mouth daily. Take first 2 tablets together, then 1 every day until finished. 07/19/22  Yes Danton Clap, PA-C  benzonatate (TESSALON) 100 MG capsule Take 1 capsule (100 mg total) by mouth every 8 (eight) hours. 07/19/22  Yes Laurene Footman B, PA-C  glipiZIDE (GLUCOTROL) 10 MG tablet Take 1 tablet (10 mg total) by mouth 2 (two) times daily before a meal. 11/28/21  Yes Sarajane Jews,  Ishmael Holter, MD  glucose blood test strip 1 each by Other route as needed for other. accu chek aviva plusUse as instructed   Yes [provider]  ketoconazole (NIZORAL) 2 % cream APPLY TO AFFECTED AREA TWICE DAILY AS NEEDED FOR IRRITATION 09/18/21  Yes Laurey Morale, MD  metFORMIN (GLUCOPHAGE) 500 MG tablet TAKE TWO TABLETS BY MOUTH EVERY MORNING WITH BREAKFAST 05/14/22  Yes Laurey Morale, MD  pravastatin (PRAVACHOL) 20 MG tablet Take 1 tablet (20 mg total) by mouth daily. 07/16/22  Yes Laurey Morale, MD   promethazine-dextromethorphan (PROMETHAZINE-DM) 6.25-15 MG/5ML syrup Take 5 mLs by mouth 4 (four) times daily as needed. 07/19/22  Yes Laurene Footman B, PA-C  tadalafil (CIALIS) 20 MG tablet Take 0.5-1 tablets (10-20 mg total) by mouth every other day as needed for erectile dysfunction. 06/06/21  Yes Laurey Morale, MD  triamcinolone cream (KENALOG) 0.1 % Apply 1 Application topically 3 (three) times daily. 07/03/22  Yes Laurey Morale, MD  warfarin (COUMADIN) 5 MG tablet TAKE 1 TABLET BY MOUTH DAILY EXCEPT TAKE 1 1/2 TABLETS ON SUNDAYS AND WEDNESDAYS OR AS DIRECTED BY ANTICOAGULATION CLINIC 05/09/22  Yes Laurey Morale, MD    Family History Family History  Problem Relation Age of Onset   Breast cancer Mother    Emphysema Father    Colon cancer Maternal Grandmother     Social History Social History   Tobacco Use   Smoking status: Former    Packs/day: 0.50    Years: 6.00    Total pack years: 3.00    Types: Cigarettes    Quit date: 1982    Years since quitting: 41.7   Smokeless tobacco: Never  Vaping Use   Vaping Use: Never used  Substance Use Topics   Alcohol use: Yes    Comment: 2-3 nights a week   Drug use: No     Allergies   Patient has no known allergies.   Review of Systems Review of Systems  Constitutional:  Positive for fatigue. Negative for fever.  HENT:  Positive for congestion. Negative for rhinorrhea, sinus pain and sore throat.   Eyes:  Negative for visual disturbance.  Respiratory:  Positive for cough and shortness of breath.   Cardiovascular:  Positive for chest pain.  Gastrointestinal:  Negative for nausea and vomiting.  Musculoskeletal:  Negative for neck pain and neck stiffness.  Neurological:  Positive for headaches. Negative for dizziness, syncope, facial asymmetry, speech difficulty, weakness, light-headedness and numbness.  Psychiatric/Behavioral:  Negative for confusion.      Physical Exam Triage Vital Signs ED Triage Vitals  Enc Vitals Group      BP      Pulse      Resp      Temp      Temp src      SpO2      Weight      Height      Head Circumference      Peak Flow      Pain Score      Pain Loc      Pain Edu?      Excl. in [place]?    No data found.  Updated Vital Signs BP (!) 146/81 (BP Location: Left Arm)   Pulse 72   Temp 98.8 F (37.1 C) (Oral)   Resp 18   Ht '6\' 3"'$  (1.905 m)   Wt 282 lb (127.9 kg)   SpO2 93%   BMI 35.25 kg/m  06/11/22 1411  BP: (!) 149/68  Pulse: 83  Resp: 18  Temp: 98.1 F (36.7 C)    Physical Exam Vitals and nursing note reviewed.  Constitutional:      General: He is not in acute distress.    Appearance: Normal appearance. He is well-developed. He is ill-appearing.  HENT:     Head: Normocephalic and atraumatic.     Right Ear: Tympanic membrane, ear canal and external ear normal.     Left Ear: Tympanic membrane, ear canal and external ear normal.     Nose: Nose normal.     Mouth/Throat:     Mouth: Mucous membranes are moist.     Pharynx: Oropharynx is clear.  Eyes:     General: No scleral icterus.    Conjunctiva/sclera: Conjunctivae normal.     Pupils: Pupils are equal, round, and reactive to light.  Cardiovascular:     Rate and Rhythm: Regular rhythm. Bradycardia present.  Pulmonary:     Effort: Pulmonary effort is normal. No respiratory distress.     Breath sounds: Wheezing and rhonchi present.     Comments: Diffuse wheezing and rhonchi throughout.  Increased wheezes specially noted in the left lower lobe. Abdominal:     Palpations: Abdomen is soft.  Musculoskeletal:     Cervical back: Neck supple.  Skin:    General: Skin is warm and dry.     Capillary Refill: Capillary refill takes less than 2 seconds.  Neurological:     General: No focal deficit present.     Mental Status: He is alert and oriented to person, place, and time. Mental status is at baseline.     Cranial Nerves: No cranial nerve deficit.     Motor: No weakness.     Coordination: Coordination  normal.     Gait: Gait normal.  Psychiatric:        Mood and Affect: Mood normal.        Behavior: Behavior normal.      UC Treatments / Results  Labs (all labs ordered are listed, but only abnormal results are displayed) Labs Reviewed - No data to display  EKG   Radiology DG Chest 2 View  Result Date: 07/19/2022 CLINICAL DATA:  Persistent cough, mild hypoxia EXAM: CHEST - 2 VIEW COMPARISON:  Prior chest x-ray 08/28/2021 FINDINGS: Patchy airspace opacity in the left lower lobe on both the frontal and lateral views concerning for pneumonia. Background hyperinflation, chronic bronchitic changes and upper lung predominant emphysematous changes again noted. Cardiac and mediastinal contours are within normal limits. No pleural effusion or pneumothorax. No acute osseous abnormality. IMPRESSION: Chest x-ray findings are concerning for left lower lobe pneumonia. Hyperinflation, bronchitic changes and probable upper lung emphysematous changes suggest underlying COPD. Electronically Signed   By: Jacqulynn Cadet M.D.   On: 07/19/2022 14:59    Procedures Procedures (including critical care time)  Medications Ordered in UC Medications  albuterol (PROVENTIL) (2.5 MG/3ML) 0.083% nebulizer solution 2.5 mg (2.5 mg Nebulization Given 07/19/22 1513)    Initial Impression / Assessment and Plan / UC Course  I have reviewed the triage vital signs and the nursing notes.  Pertinent labs & imaging results that were available during my care of the patient were reviewed by me and considered in my medical decision making (see chart for details).   61 year old male presenting for cough productive of greenish-yellow sputum, shortness of breath, headaches and fatigue x8 days.  Symptoms are worsening and not improving.  Patient is afebrile.  He is ill-appearing but nontoxic.  Oxygen is 93%.  On exam he has diffuse wheezing and rhonchi throughout all lung fields, most pronounced in the left lower lobe.  He is  in no distress.  Chest x-ray performed today and reviewed by me.  X-ray shows left lower lobe pneumonia.  There are also bronchitis type changes.  He has no history of asthma or COPD.  He does have an albuterol inhaler at home that he can use.  We will give him albuterol nebulizer before leaving today.  I reviewed the x-ray results with patient.  We will treat him with Augmentin and azithromycin at this time.  Also sent benzonatate and Promethazine DM.  Encouraged him to keep a check on his INR is and let his prescriber know as it can be affected by the antibiotics.  Advised plenty rest and fluids.  He has a annual exam with his PCP in 2 weeks.  Advised to keep appointment.  Will need to have x-ray recheck.  Advised to return or go to ER sooner if he develops a fever, worsening cough, increased shortness of breath or increased weakness.  Patient agrees.   Final Clinical Impressions(s) / UC Diagnoses   Final diagnoses:  Pneumonia of left lower lobe due to infectious organism  Acute cough  Shortness of breath  Essential hypertension     Discharge Instructions      -You have pneumonia. - I sent 2 different antibiotics to pharmacy.  Take them both I also sent cough medication to pharmacy. - We gave you a breathing treatment here.  Use your albuterol inhaler at home if needed for any shortness of breath. - Rest increase your fluid intake.  Keep your appoint with your PCP in the next couple weeks for recheck.  You will need a repeat x-ray. - If you develop fever, worsening cough, increased breathing difficulty need to go to the emergency department. - Let your prescriber know that you are on antibiotics because it may affect your INRs     ED Prescriptions     Medication Sig Dispense Auth. Provider   benzonatate (TESSALON) 100 MG capsule Take 1 capsule (100 mg total) by mouth every 8 (eight) hours. 21 capsule Danton Clap, PA-C   promethazine-dextromethorphan (PROMETHAZINE-DM) 6.25-15  MG/5ML syrup Take 5 mLs by mouth 4 (four) times daily as needed. 118 mL Laurene Footman B, PA-C   amoxicillin-clavulanate (AUGMENTIN) 875-125 MG tablet Take 1 tablet by mouth every 12 (twelve) hours for 7 days. 14 tablet Laurene Footman B, PA-C   azithromycin (ZITHROMAX) 250 MG tablet Take 1 tablet (250 mg total) by mouth daily. Take first 2 tablets together, then 1 every day until finished. 6 tablet Gretta Cool      PDMP not reviewed this encounter.   Danton Clap, PA-C 07/19/22 1524

## 2022-07-20 NOTE — Telephone Encounter (Signed)
Please advise 

## 2022-07-23 ENCOUNTER — Telehealth: Payer: Self-pay

## 2022-07-23 NOTE — Telephone Encounter (Signed)
Pt called to report that he has been taking a azithromycin since 07/19/22 for pneumonia. Last dose is scheduled for 9/26. Pt requests INR check based on hematologist's advice. Next INR check not scheduled until 10/11. Scheduled INR check for 07/25/22 at [place].

## 2022-07-25 ENCOUNTER — Ambulatory Visit (INDEPENDENT_AMBULATORY_CARE_PROVIDER_SITE_OTHER): Payer: BC Managed Care – PPO

## 2022-07-25 DIAGNOSIS — I872 Venous insufficiency (chronic) (peripheral): Secondary | ICD-10-CM

## 2022-07-25 DIAGNOSIS — Z7901 Long term (current) use of anticoagulants: Secondary | ICD-10-CM | POA: Diagnosis not present

## 2022-07-25 DIAGNOSIS — I82411 Acute embolism and thrombosis of right femoral vein: Secondary | ICD-10-CM

## 2022-07-25 LAB — POCT INR: INR: 1.6 — AB (ref 2.0–3.0)

## 2022-07-25 MED ORDER — WARFARIN SODIUM 5 MG PO TABS: ORAL_TABLET | ORAL | 2 refills | Status: DC

## 2022-07-25 NOTE — Patient Instructions (Addendum)
INR 1.6 today. Increase dose today to take 2 tablets and increase dose tomorrow to take 1 1/2 tablets. Then change weekly dose to take 1 tablet daily except take 1 1/2 tablets on Fridays, Sundays and Wednesdays. Recheck INR with labs on appointment with Dr. Sarajane Jews on 10/11.

## 2022-07-25 NOTE — Progress Notes (Signed)
Pt reports that he took azithromycin from 9/21 - 9/26 for pneumonia. Pt will also finish his course of amoxicillin tomorrow.    Increase dose today to take 2 tablets and increase dose tomorrow to take 1 1/2 tablets. Then change weekly dose to take 1 tablet daily except take 1 1/2 tablets on Fridays, Sundays and Wednesdays. Recheck INR with labs on appointment with Dr. Sarajane Jews on 10/11.

## 2022-08-05 ENCOUNTER — Encounter: Payer: Self-pay | Admitting: Family Medicine

## 2022-08-05 ENCOUNTER — Other Ambulatory Visit: Payer: Self-pay | Admitting: Family Medicine

## 2022-08-05 DIAGNOSIS — I10 Essential (primary) hypertension: Secondary | ICD-10-CM

## 2022-08-06 MED ORDER — METFORMIN HCL 500 MG PO TABS: ORAL_TABLET | ORAL | 0 refills | Status: DC

## 2022-08-06 MED ORDER — AMLODIPINE BESYLATE 5 MG PO TABS: 5.0000 mg | ORAL_TABLET | Freq: Every day | ORAL | 0 refills | Status: DC

## 2022-08-08 ENCOUNTER — Ambulatory Visit (INDEPENDENT_AMBULATORY_CARE_PROVIDER_SITE_OTHER): Payer: BC Managed Care – PPO

## 2022-08-08 ENCOUNTER — Ambulatory Visit (INDEPENDENT_AMBULATORY_CARE_PROVIDER_SITE_OTHER): Payer: BC Managed Care – PPO | Admitting: Family Medicine

## 2022-08-08 ENCOUNTER — Encounter: Payer: Self-pay | Admitting: Family Medicine

## 2022-08-08 VITALS — BP 142/70 | HR 63 | Temp 98.1°F | Ht 75.0 in | Wt 289.0 lb

## 2022-08-08 DIAGNOSIS — Z23 Encounter for immunization: Secondary | ICD-10-CM | POA: Diagnosis not present

## 2022-08-08 DIAGNOSIS — I10 Essential (primary) hypertension: Secondary | ICD-10-CM | POA: Diagnosis not present

## 2022-08-08 DIAGNOSIS — Z0000 Encounter for general adult medical examination without abnormal findings: Secondary | ICD-10-CM

## 2022-08-08 DIAGNOSIS — E7800 Pure hypercholesterolemia, unspecified: Secondary | ICD-10-CM | POA: Diagnosis not present

## 2022-08-08 DIAGNOSIS — Z7901 Long term (current) use of anticoagulants: Secondary | ICD-10-CM

## 2022-08-08 DIAGNOSIS — Z125 Encounter for screening for malignant neoplasm of prostate: Secondary | ICD-10-CM | POA: Diagnosis not present

## 2022-08-08 DIAGNOSIS — J189 Pneumonia, unspecified organism: Secondary | ICD-10-CM

## 2022-08-08 LAB — HEMOGLOBIN A1C: Hgb A1c MFr Bld: 5.8 % (ref 4.6–6.5)

## 2022-08-08 LAB — HEPATIC FUNCTION PANEL
ALT: 31 U/L (ref 0–53)
AST: 32 U/L (ref 0–37)
Albumin: 4.1 g/dL (ref 3.5–5.2)
Alkaline Phosphatase: 79 U/L (ref 39–117)
Bilirubin, Direct: 0.2 mg/dL (ref 0.0–0.3)
Total Bilirubin: 0.9 mg/dL (ref 0.2–1.2)
Total Protein: 6.5 g/dL (ref 6.0–8.3)

## 2022-08-08 LAB — BASIC METABOLIC PANEL
BUN: 10 mg/dL (ref 6–23)
CO2: 29 mEq/L (ref 19–32)
Calcium: 9.4 mg/dL (ref 8.4–10.5)
Chloride: 102 mEq/L (ref 96–112)
Creatinine, Ser: 0.72 mg/dL (ref 0.40–1.50)
GFR: 97.45 mL/min (ref >60.00)
Glucose, Bld: 83 mg/dL (ref 70–99)
Potassium: 4.8 mEq/L (ref 3.5–5.1)
Sodium: 143 mEq/L (ref 135–145)

## 2022-08-08 LAB — LIPID PANEL
Cholesterol: 172 mg/dL (ref 0–200)
HDL: 74.3 mg/dL (ref >39.00)
LDL Cholesterol: 84 mg/dL (ref 0–99)
NonHDL: 97.24
Total CHOL/HDL Ratio: 2
Triglycerides: 66 mg/dL (ref 0.0–149.0)
VLDL: 13.2 mg/dL (ref 0.0–40.0)

## 2022-08-08 LAB — CBC WITH DIFFERENTIAL/PLATELET
Basophils Absolute: 0.1 10*3/uL (ref 0.0–0.1)
Basophils Relative: 0.9 % (ref 0.0–3.0)
Eosinophils Absolute: 0 10*3/uL (ref 0.0–0.7)
Eosinophils Relative: 0.8 % (ref 0.0–5.0)
HCT: 53 % — ABNORMAL HIGH (ref 39.0–52.0)
Hemoglobin: 17.7 g/dL — ABNORMAL HIGH (ref 13.0–17.0)
Lymphocytes Relative: 7.5 % — ABNORMAL LOW (ref 12.0–46.0)
Lymphs Abs: 0.5 10*3/uL — ABNORMAL LOW (ref 0.7–4.0)
MCHC: 33.3 g/dL (ref 30.0–36.0)
MCV: 95.5 fl (ref 78.0–100.0)
Monocytes Absolute: 0.5 10*3/uL (ref 0.1–1.0)
Monocytes Relative: 8.4 % (ref 3.0–12.0)
Neutro Abs: 5.1 10*3/uL (ref 1.4–7.7)
Neutrophils Relative %: 82.4 % — ABNORMAL HIGH (ref 43.0–77.0)
Platelets: 220 10*3/uL (ref 150.0–400.0)
RBC: 5.55 Mil/uL (ref 4.22–5.81)
RDW: 13.5 % (ref 11.5–15.5)
WBC: 6.2 10*3/uL (ref 4.0–10.5)

## 2022-08-08 LAB — TSH: TSH: 1.64 u[IU]/mL (ref 0.35–5.50)

## 2022-08-08 LAB — PROTIME-INR
INR: 4 ratio — ABNORMAL HIGH (ref 0.8–1.0)
Prothrombin Time: 40.5 s — ABNORMAL HIGH (ref 9.6–13.1)

## 2022-08-08 LAB — PSA: PSA: 2.31 ng/mL (ref 0.10–4.00)

## 2022-08-08 MED ORDER — PRAVASTATIN SODIUM 20 MG PO TABS: 20.0000 mg | ORAL_TABLET | Freq: Every day | ORAL | 3 refills | Status: DC

## 2022-08-08 MED ORDER — AMLODIPINE BESYLATE 5 MG PO TABS: 5.0000 mg | ORAL_TABLET | Freq: Every day | ORAL | 3 refills | Status: DC

## 2022-08-08 MED ORDER — GLIPIZIDE 10 MG PO TABS: 10.0000 mg | ORAL_TABLET | Freq: Two times a day (BID) | ORAL | 3 refills | Status: DC

## 2022-08-08 MED ORDER — TADALAFIL 20 MG PO TABS: 10.0000 mg | ORAL_TABLET | ORAL | 11 refills | Status: DC | PRN

## 2022-08-08 NOTE — Progress Notes (Signed)
Subjective:    Patient ID: Christopher Marsh, male    DOB: February 09, 1961, 61 y.o.   MRN: [id number]  HPI Here for a well exam. He is doing well in general although he has an occasional dry cough. He was seen at urgent care on 07-19-22 for a cough, and Xrays revealed a LLL pneumonia. He was treated with both Augmentin and a Zpack, and he seemed to get over it. He still has the cough however.    Review of Systems  Constitutional: Negative.   HENT: Negative.    Eyes: Negative.   Respiratory:  Positive for cough.   Cardiovascular: Negative.   Gastrointestinal: Negative.   Genitourinary: Negative.   Musculoskeletal: Negative.   Skin: Negative.   Neurological: Negative.   Psychiatric/Behavioral: Negative.         Objective:   Physical Exam Constitutional:      General: He is not in acute distress.    Appearance: Normal appearance. He is well-developed. He is not diaphoretic.  HENT:     Head: Normocephalic and atraumatic.     Right Ear: External ear normal.     Left Ear: External ear normal.     Nose: Nose normal.     Mouth/Throat:     Pharynx: No oropharyngeal exudate.  Eyes:     General: No scleral icterus.       Right eye: No discharge.        Left eye: No discharge.     Conjunctiva/sclera: Conjunctivae normal.     Pupils: Pupils are equal, round, and reactive to light.  Neck:     Thyroid: No thyromegaly.     Vascular: No JVD.     Trachea: No tracheal deviation.  Cardiovascular:     Rate and Rhythm: Normal rate and regular rhythm.     Heart sounds: Normal heart sounds. No murmur heard.    No friction rub. No gallop.  Pulmonary:     Effort: Pulmonary effort is normal. No respiratory distress.     Breath sounds: No wheezing.     Comments: Rales are present in the left base  Chest:     Chest wall: No tenderness.  Abdominal:     General: Bowel sounds are normal. There is no distension.     Palpations: Abdomen is soft. There is no mass.     Tenderness: There is no  abdominal tenderness. There is no guarding or rebound.  Genitourinary:    Penis: Normal. No tenderness.      Testes: Normal.     Prostate: Normal.     Rectum: Normal. Guaiac result negative.  Musculoskeletal:        General: No tenderness. Normal range of motion.     Cervical back: Neck supple.  Lymphadenopathy:     Cervical: No cervical adenopathy.  Skin:    General: Skin is warm and dry.     Coloration: Skin is not pale.     Findings: No erythema or rash.  Neurological:     Mental Status: He is alert and oriented to person, place, and time.     Cranial Nerves: No cranial nerve deficit.     Motor: No abnormal muscle tone.     Coordination: Coordination normal.     Deep Tendon Reflexes: Reflexes are normal and symmetric. Reflexes normal.  Psychiatric:        Behavior: Behavior normal.        Thought Content: Thought content normal.  Judgment: Judgment normal.           Assessment & Plan:  Well exam. We discussed diet and exercise. Get fasting labs. He still has congestion in the left lower lung as well as emphysema, so we will set up a chest CT to get a better look at this.  Alysia Penna, MD

## 2022-08-08 NOTE — Patient Instructions (Signed)
Hold dose today. Then change weekly dose to take 1 tablet daily EXCEPT take 1 1/2 tablets on Sundays. Recheck in 2 weeks.

## 2022-08-08 NOTE — Progress Notes (Signed)
Hold dose today. Then change weekly dose to take 1 tablet daily EXCEPT take 1 1/2 tablets on Sundays. Recheck in 2 weeks. Called pt and gave him the above instructions, verbalized understanding.

## 2022-08-08 NOTE — Addendum Note (Signed)
Addended by: Wyvonne Lenz on: 08/08/2022 11:44 AM   Modules accepted: Orders

## 2022-08-14 DIAGNOSIS — I890 Lymphedema, not elsewhere classified: Secondary | ICD-10-CM | POA: Diagnosis not present

## 2022-08-16 ENCOUNTER — Ambulatory Visit
Admission: RE | Admit: 2022-08-16 | Discharge: 2022-08-16 | Disposition: A | Discharge status: Home / Self Care | Payer: BC Managed Care – PPO | Source: Ambulatory Visit | Attending: Family Medicine | Admitting: Family Medicine

## 2022-08-16 DIAGNOSIS — J984 Other disorders of lung: Secondary | ICD-10-CM | POA: Diagnosis not present

## 2022-08-16 DIAGNOSIS — R0989 Other specified symptoms and signs involving the circulatory and respiratory systems: Secondary | ICD-10-CM | POA: Diagnosis not present

## 2022-08-16 DIAGNOSIS — I700 Atherosclerosis of aorta: Secondary | ICD-10-CM | POA: Diagnosis not present

## 2022-08-16 DIAGNOSIS — R590 Localized enlarged lymph nodes: Secondary | ICD-10-CM | POA: Diagnosis not present

## 2022-08-16 DIAGNOSIS — J189 Pneumonia, unspecified organism: Secondary | ICD-10-CM

## 2022-08-16 MED ORDER — IOPAMIDOL (ISOVUE-300) INJECTION 61%
75.0000 mL | Freq: Once | INTRAVENOUS | Status: AC | PRN
  Administered 2022-08-16: 75 mL via INTRAVENOUS

## 2022-08-18 ENCOUNTER — Emergency Department (HOSPITAL_COMMUNITY): Payer: BC Managed Care – PPO

## 2022-08-18 ENCOUNTER — Other Ambulatory Visit: Payer: Self-pay

## 2022-08-18 ENCOUNTER — Encounter (HOSPITAL_COMMUNITY): Payer: Self-pay

## 2022-08-18 ENCOUNTER — Telehealth: Payer: Self-pay | Admitting: Family Medicine

## 2022-08-18 ENCOUNTER — Inpatient Hospital Stay (HOSPITAL_COMMUNITY)
Admission: EM | Admit: 2022-08-18 | Discharge: 2022-08-21 | DRG: 194 | Disposition: A | Discharge status: Home / Self Care | Payer: BC Managed Care – PPO | Attending: Internal Medicine | Admitting: Internal Medicine

## 2022-08-18 DIAGNOSIS — Z96642 Presence of left artificial hip joint: Secondary | ICD-10-CM | POA: Diagnosis present

## 2022-08-18 DIAGNOSIS — D751 Secondary polycythemia: Secondary | ICD-10-CM | POA: Diagnosis not present

## 2022-08-18 DIAGNOSIS — Z1152 Encounter for screening for COVID-19: Secondary | ICD-10-CM | POA: Diagnosis not present

## 2022-08-18 DIAGNOSIS — J439 Emphysema, unspecified: Secondary | ICD-10-CM | POA: Diagnosis present

## 2022-08-18 DIAGNOSIS — Z87891 Personal history of nicotine dependence: Secondary | ICD-10-CM

## 2022-08-18 DIAGNOSIS — B1920 Unspecified viral hepatitis C without hepatic coma: Secondary | ICD-10-CM | POA: Diagnosis not present

## 2022-08-18 DIAGNOSIS — I872 Venous insufficiency (chronic) (peripheral): Secondary | ICD-10-CM | POA: Diagnosis not present

## 2022-08-18 DIAGNOSIS — R160 Hepatomegaly, not elsewhere classified: Secondary | ICD-10-CM | POA: Diagnosis not present

## 2022-08-18 DIAGNOSIS — Z86718 Personal history of other venous thrombosis and embolism: Secondary | ICD-10-CM

## 2022-08-18 DIAGNOSIS — I10 Essential (primary) hypertension: Secondary | ICD-10-CM | POA: Diagnosis not present

## 2022-08-18 DIAGNOSIS — K219 Gastro-esophageal reflux disease without esophagitis: Secondary | ICD-10-CM | POA: Diagnosis present

## 2022-08-18 DIAGNOSIS — I890 Lymphedema, not elsewhere classified: Secondary | ICD-10-CM | POA: Diagnosis present

## 2022-08-18 DIAGNOSIS — I82411 Acute embolism and thrombosis of right femoral vein: Secondary | ICD-10-CM | POA: Diagnosis not present

## 2022-08-18 DIAGNOSIS — E7800 Pure hypercholesterolemia, unspecified: Secondary | ICD-10-CM

## 2022-08-18 DIAGNOSIS — Z7901 Long term (current) use of anticoagulants: Secondary | ICD-10-CM | POA: Diagnosis not present

## 2022-08-18 DIAGNOSIS — Z8701 Personal history of pneumonia (recurrent): Secondary | ICD-10-CM

## 2022-08-18 DIAGNOSIS — R0602 Shortness of breath: Secondary | ICD-10-CM | POA: Diagnosis not present

## 2022-08-18 DIAGNOSIS — J189 Pneumonia, unspecified organism: Principal | ICD-10-CM | POA: Diagnosis present

## 2022-08-18 DIAGNOSIS — E119 Type 2 diabetes mellitus without complications: Secondary | ICD-10-CM | POA: Diagnosis not present

## 2022-08-18 DIAGNOSIS — R590 Localized enlarged lymph nodes: Secondary | ICD-10-CM | POA: Diagnosis not present

## 2022-08-18 DIAGNOSIS — C228 Malignant neoplasm of liver, primary, unspecified as to type: Secondary | ICD-10-CM | POA: Diagnosis not present

## 2022-08-18 DIAGNOSIS — E1169 Type 2 diabetes mellitus with other specified complication: Secondary | ICD-10-CM

## 2022-08-18 DIAGNOSIS — I509 Heart failure, unspecified: Secondary | ICD-10-CM | POA: Diagnosis not present

## 2022-08-18 DIAGNOSIS — G4733 Obstructive sleep apnea (adult) (pediatric): Secondary | ICD-10-CM | POA: Diagnosis present

## 2022-08-18 DIAGNOSIS — Z8616 Personal history of COVID-19: Secondary | ICD-10-CM

## 2022-08-18 DIAGNOSIS — N281 Cyst of kidney, acquired: Secondary | ICD-10-CM | POA: Diagnosis present

## 2022-08-18 DIAGNOSIS — D6862 Lupus anticoagulant syndrome: Secondary | ICD-10-CM | POA: Diagnosis present

## 2022-08-18 DIAGNOSIS — R0683 Snoring: Secondary | ICD-10-CM | POA: Diagnosis not present

## 2022-08-18 DIAGNOSIS — K7689 Other specified diseases of liver: Secondary | ICD-10-CM | POA: Diagnosis not present

## 2022-08-18 DIAGNOSIS — D6861 Antiphospholipid syndrome: Secondary | ICD-10-CM | POA: Diagnosis not present

## 2022-08-18 DIAGNOSIS — R932 Abnormal findings on diagnostic imaging of liver and biliary tract: Secondary | ICD-10-CM | POA: Diagnosis not present

## 2022-08-18 DIAGNOSIS — Z6835 Body mass index (BMI) 35.0-35.9, adult: Secondary | ICD-10-CM

## 2022-08-18 DIAGNOSIS — K769 Liver disease, unspecified: Secondary | ICD-10-CM | POA: Diagnosis not present

## 2022-08-18 DIAGNOSIS — E785 Hyperlipidemia, unspecified: Secondary | ICD-10-CM | POA: Diagnosis present

## 2022-08-18 DIAGNOSIS — Z825 Family history of asthma and other chronic lower respiratory diseases: Secondary | ICD-10-CM

## 2022-08-18 DIAGNOSIS — F5101 Primary insomnia: Secondary | ICD-10-CM

## 2022-08-18 DIAGNOSIS — G4700 Insomnia, unspecified: Secondary | ICD-10-CM | POA: Diagnosis present

## 2022-08-18 DIAGNOSIS — Z7984 Long term (current) use of oral hypoglycemic drugs: Secondary | ICD-10-CM

## 2022-08-18 DIAGNOSIS — Z8619 Personal history of other infectious and parasitic diseases: Secondary | ICD-10-CM | POA: Diagnosis present

## 2022-08-18 DIAGNOSIS — R1084 Generalized abdominal pain: Secondary | ICD-10-CM | POA: Diagnosis not present

## 2022-08-18 DIAGNOSIS — B182 Chronic viral hepatitis C: Secondary | ICD-10-CM | POA: Diagnosis not present

## 2022-08-18 DIAGNOSIS — C229 Malignant neoplasm of liver, not specified as primary or secondary: Secondary | ICD-10-CM | POA: Diagnosis not present

## 2022-08-18 DIAGNOSIS — K2100 Gastro-esophageal reflux disease with esophagitis, without bleeding: Secondary | ICD-10-CM | POA: Diagnosis present

## 2022-08-18 DIAGNOSIS — Z79899 Other long term (current) drug therapy: Secondary | ICD-10-CM

## 2022-08-18 HISTORY — DX: Secondary polycythemia: D75.1

## 2022-08-18 HISTORY — DX: Nontoxic multinodular goiter: E04.2

## 2022-08-18 HISTORY — DX: Antiphospholipid syndrome: D68.61

## 2022-08-18 HISTORY — DX: Postthrombotic syndrome without complications of unspecified extremity: I87.009

## 2022-08-18 LAB — CBC WITH DIFFERENTIAL/PLATELET
Abs Immature Granulocytes: 0.02 10*3/uL (ref 0.00–0.07)
Basophils Absolute: 0.1 10*3/uL (ref 0.0–0.1)
Basophils Relative: 1 %
Eosinophils Absolute: 0.1 10*3/uL (ref 0.0–0.5)
Eosinophils Relative: 1 %
HCT: 54.7 % — ABNORMAL HIGH (ref 39.0–52.0)
Hemoglobin: 18 g/dL — ABNORMAL HIGH (ref 13.0–17.0)
Immature Granulocytes: 0 %
Lymphocytes Relative: 13 %
Lymphs Abs: 0.8 10*3/uL (ref 0.7–4.0)
MCH: 31.6 pg (ref 26.0–34.0)
MCHC: 32.9 g/dL (ref 30.0–36.0)
MCV: 96 fL (ref 80.0–100.0)
Monocytes Absolute: 0.8 10*3/uL (ref 0.1–1.0)
Monocytes Relative: 12 %
Neutro Abs: 4.8 10*3/uL (ref 1.7–7.7)
Neutrophils Relative %: 73 %
Platelets: 215 10*3/uL (ref 150–400)
RBC: 5.7 MIL/uL (ref 4.22–5.81)
RDW: 13.1 % (ref 11.5–15.5)
WBC: 6.5 10*3/uL (ref 4.0–10.5)
nRBC: 0 % (ref 0.0–0.2)

## 2022-08-18 LAB — RESP PANEL BY RT-PCR (FLU A&B, COVID) ARPGX2
Influenza A by PCR: NEGATIVE
Influenza B by PCR: NEGATIVE
SARS Coronavirus 2 by RT PCR: NEGATIVE

## 2022-08-18 LAB — COMPREHENSIVE METABOLIC PANEL
ALT: 30 U/L (ref 0–44)
AST: 30 U/L (ref 15–41)
Albumin: 4 g/dL (ref 3.5–5.0)
Alkaline Phosphatase: 73 U/L (ref 38–126)
Anion gap: 7 (ref 5–15)
BUN: 10 mg/dL (ref 8–23)
CO2: 27 mmol/L (ref 22–32)
Calcium: 8.9 mg/dL (ref 8.9–10.3)
Chloride: 104 mmol/L (ref 98–111)
Creatinine, Ser: 0.95 mg/dL (ref 0.61–1.24)
GFR, Estimated: >60 mL/min (ref >60)
Glucose, Bld: 109 mg/dL — ABNORMAL HIGH (ref 70–99)
Potassium: 4.5 mmol/L (ref 3.5–5.1)
Sodium: 138 mmol/L (ref 135–145)
Total Bilirubin: 1.2 mg/dL (ref 0.3–1.2)
Total Protein: 6.9 g/dL (ref 6.5–8.1)

## 2022-08-18 LAB — PROTIME-INR
INR: 2.4 — ABNORMAL HIGH (ref 0.8–1.2)
Prothrombin Time: 25.8 seconds — ABNORMAL HIGH (ref 11.4–15.2)

## 2022-08-18 LAB — BRAIN NATRIURETIC PEPTIDE: B Natriuretic Peptide: 102.2 pg/mL — ABNORMAL HIGH (ref 0.0–100.0)

## 2022-08-18 LAB — TROPONIN I (HIGH SENSITIVITY)
Troponin I (High Sensitivity): 8 ng/L (ref <18)
Troponin I (High Sensitivity): 9 ng/L (ref <18)

## 2022-08-18 LAB — PROCALCITONIN: Procalcitonin: <0.1 ng/mL

## 2022-08-18 LAB — LACTIC ACID, PLASMA: Lactic Acid, Venous: 1.7 mmol/L (ref 0.5–1.9)

## 2022-08-18 MED ORDER — SODIUM CHLORIDE 0.9% FLUSH
3.0000 mL | Freq: Two times a day (BID) | INTRAVENOUS | Status: DC
  Administered 2022-08-18 – 2022-08-21 (×6): 3 mL via INTRAVENOUS

## 2022-08-18 MED ORDER — IOHEXOL 300 MG/ML  SOLN
100.0000 mL | Freq: Once | INTRAMUSCULAR | Status: AC | PRN
  Administered 2022-08-18: 100 mL via INTRAVENOUS

## 2022-08-18 MED ORDER — ALBUTEROL SULFATE HFA 108 (90 BASE) MCG/ACT IN AERS: 2.0000 | INHALATION_SPRAY | RESPIRATORY_TRACT | Status: DC | PRN

## 2022-08-18 MED ORDER — SODIUM CHLORIDE 0.9 % IV SOLN
1.0000 g | INTRAVENOUS | Status: DC
  Administered 2022-08-18: 1 g via INTRAVENOUS
  Filled 2022-08-18 (×2): qty 10

## 2022-08-18 MED ORDER — INSULIN ASPART 100 UNIT/ML IJ SOLN
0.0000 [IU] | Freq: Three times a day (TID) | INTRAMUSCULAR | Status: DC
  Administered 2022-08-19: 3 [IU] via SUBCUTANEOUS
  Administered 2022-08-20: 2 [IU] via SUBCUTANEOUS
  Administered 2022-08-20: 3 [IU] via SUBCUTANEOUS
  Administered 2022-08-21 (×2): 2 [IU] via SUBCUTANEOUS
  Filled 2022-08-18: qty 0.15

## 2022-08-18 MED ORDER — POLYETHYLENE GLYCOL 3350 17 G PO PACK: 17.0000 g | PACK | Freq: Every day | ORAL | Status: DC | PRN

## 2022-08-18 MED ORDER — SODIUM CHLORIDE 0.9 % IV SOLN
2.0000 g | Freq: Once | INTRAVENOUS | Status: AC
  Administered 2022-08-18: 2 g via INTRAVENOUS
  Filled 2022-08-18: qty 12.5

## 2022-08-18 MED ORDER — ACETAMINOPHEN 650 MG RE SUPP: 650.0000 mg | Freq: Four times a day (QID) | RECTAL | Status: DC | PRN

## 2022-08-18 MED ORDER — DEXTROSE 5 % IV SOLN
250.0000 mg | INTRAVENOUS | Status: DC
  Administered 2022-08-19 – 2022-08-20 (×3): 250 mg via INTRAVENOUS
  Filled 2022-08-18 (×4): qty 2.5

## 2022-08-18 MED ORDER — ACETAMINOPHEN 325 MG PO TABS: 650.0000 mg | ORAL_TABLET | Freq: Four times a day (QID) | ORAL | Status: DC | PRN

## 2022-08-18 MED ORDER — VANCOMYCIN HCL 2000 MG/400ML IV SOLN
2000.0000 mg | Freq: Once | INTRAVENOUS | Status: AC
  Administered 2022-08-18: 2000 mg via INTRAVENOUS
  Filled 2022-08-18: qty 400

## 2022-08-18 MED ORDER — AMLODIPINE BESYLATE 5 MG PO TABS
5.0000 mg | ORAL_TABLET | Freq: Every day | ORAL | Status: DC
  Administered 2022-08-19 – 2022-08-21 (×3): 5 mg via ORAL
  Filled 2022-08-18 (×3): qty 1

## 2022-08-18 MED ORDER — TRIAMCINOLONE ACETONIDE 0.1 % EX CREA: 1.0000 | TOPICAL_CREAM | Freq: Three times a day (TID) | CUTANEOUS | Status: DC | PRN

## 2022-08-18 MED ORDER — ALBUTEROL SULFATE (2.5 MG/3ML) 0.083% IN NEBU: 2.5000 mg | INHALATION_SOLUTION | RESPIRATORY_TRACT | Status: DC | PRN

## 2022-08-18 MED ORDER — PRAVASTATIN SODIUM 20 MG PO TABS
20.0000 mg | ORAL_TABLET | Freq: Every day | ORAL | Status: DC
  Administered 2022-08-19 – 2022-08-21 (×3): 20 mg via ORAL
  Filled 2022-08-18 (×3): qty 1

## 2022-08-18 NOTE — Progress Notes (Signed)
A consult was received from an ED physician for vancomycin and cefepime per pharmacy dosing.  The patient's profile has been reviewed for ht/wt/allergies/indication/available labs.    A one time order has been placed for vancomycin 2000 mg IV x1 and cefepime 2gm IV x1.  Further antibiotics/pharmacy consults should be ordered by admitting physician if indicated.                       Thank you, Lynelle Doctor 08/18/2022  5:35 PM

## 2022-08-18 NOTE — ED Triage Notes (Signed)
Patient reports that he had a CT scan 2 days ago and patient was notified today that he needed to come to the Ed for IV antibiotics because he had multifocal pneumonia.

## 2022-08-18 NOTE — Telephone Encounter (Signed)
Call report noted for CT scan of chest with the following result    IMPRESSION: 1. Multifocal airspace disease in both lungs, left side greater than right. Findings are most compatible with multifocal pneumonia. 2. Multiple new hepatic lesions with enlarging lymph nodes in the upper abdomen and chest. Findings are concerning for metastatic disease. Based on the multifocal pneumonia, hepatic abscesses would also be in the differential diagnosis. Recommend further characterization of the abdomen and pelvis with CT with IV contrast. 3. Enlargement of the main pulmonary artery could be associated with pulmonary hypertension. 4. Coronary artery calcifications. 5. Multinodular goiter. Patient has known thyroid nodules and previous thyroid ultrasound.  It looks as though he has been treated with abx for the pna once   Will reach out to PCP  I imagine other findings are new

## 2022-08-18 NOTE — H&P (Addendum)
History and Physical   Christopher Marsh ZOX:[id number] DOB: [birth year] DOA: 08/18/2022  PCP: Laurey Morale, MD   Patient coming from: Home  Chief Complaint: Cough and shortness of breath  HPI: Christopher Marsh is a 61 y.o. male with medical history significant of hyperlipidemia, venous insufficiency, lymphedema, esophagitis, hepatitis C, diabetes, obesity, hypertension, recurrent DVTs presenting with cough and shortness of breath.  Patient has had ongoing cough for the past 4 to 5 months.  He did have an episode in September of this year where he was seen at urgent care with fevers cough shortness of breath and increased green-yellow sputum.  At that time he was diagnosed with left lower lobe pneumonia based on x-ray findings and prescribed Augmentin and azithromycin.  He had some improvement with this but continued to have his chronic cough.  He recently had outpatient CT of the chest ordered due to continued cough and congestion which showed evidence of possible multifocal pneumonia and liver masses.  He presented to ED for further evaluation.  He denies fevers, chills, chest pain, abdominal pain, constipation, diarrhea, nausea, vomiting.  ED Course: Vital signs in ED significant for blood pressure in the 811B to 147 systolic.  Lab work-up included CMP with glucose 109.  CBC with hemoglobin of 18.  PT and INR stably elevated at 25.8 and 2.4 respectively.  Lactic acid normal with repeat pending.  Troponin normal with repeat pending.  BNP equivocal at 102.  Blood cultures pending.  CT abdomen pelvis here showed new mass in the right hepatic lobe concerning for neoplasm/malignancy with follow-up MRI recommended.  Periaortic lymphadenopathy also noted.  Stable basilar opacities representing infection/inflammation versus atelectasis versus scarring.  Stable cystic lesion of the right kidney also noted.  Patient received vancomycin and cefepime in the ED.  Case was discussed with pulmonology by phone to  discuss CT read from outpatient scan and they agree that this is most consistent with pneumonia findings.  Review of Systems: As per HPI otherwise all other systems reviewed and are negative.  Past Medical History:  Diagnosis Date   Arthritis    Diabetes mellitus without complication ([place])    DVT (deep venous thrombosis) (HCC)    Lt leg   Dyspnea    Elevated lipids    Hyperlipidemia    Hypertension    Lower extremity edema     Past Surgical History:  Procedure Laterality Date   COLONOSCOPY WITH PROPOFOL N/A 09/23/2018   Per Dr. Alice Reichert, polyp, repeat in 3 yrs    EYE MUSCLE SURGERY     JOINT REPLACEMENT Left 07/22/2017   TOTAL HIP ARTHROPLASTY Left 07/22/2017   Procedure: TOTAL HIP ARTHROPLASTY;  Surgeon: Dereck Leep, MD;  Location: [facility] ORS;  Service: Orthopedics;  Laterality: Left;    Social History  reports that he quit smoking about 41 years ago. His smoking use included cigarettes. He has a 3.00 pack-year smoking history. He has never used smokeless tobacco. He reports current alcohol use. He reports that he does not use drugs.  No Known Allergies  Family History  Problem Relation Age of Onset   Breast cancer Mother    Emphysema Father    Colon cancer Maternal Grandmother   Reviewed on admission  Prior to Admission medications   Medication Sig Start Date End Date Taking? Authorizing Provider  albuterol (VENTOLIN HFA) 108 (90 Base) MCG/ACT inhaler Inhale 2 puffs into the lungs every 4 (four) hours as needed. Patient taking differently: Inhale 2 puffs into the  lungs every 4 (four) hours as needed for wheezing or shortness of breath. 05/11/22  Yes Laurey Morale, MD  amLODipine (NORVASC) 5 MG tablet Take 1 tablet (5 mg total) by mouth daily. 08/08/22  Yes Laurey Morale, MD  glipiZIDE (GLUCOTROL) 10 MG tablet Take 1 tablet (10 mg total) by mouth 2 (two) times daily before a meal. Patient taking differently: Take 10 mg by mouth daily before breakfast. 08/08/22  Yes Laurey Morale, MD  magnesium oxide (MAG-OX) 400 (240 Mg) MG tablet Take 400 mg by mouth in the morning.   Yes [provider]  metFORMIN (GLUCOPHAGE) 500 MG tablet TAKE TWO TABLETS BY MOUTH EVERY MORNING WITH BREAKFAST Patient taking differently: Take 1,000 mg by mouth daily with breakfast. 08/06/22  Yes Laurey Morale, MD  pravastatin (PRAVACHOL) 20 MG tablet Take 1 tablet (20 mg total) by mouth daily. 08/08/22  Yes Laurey Morale, MD  triamcinolone cream (KENALOG) 0.1 % Apply 1 Application topically 3 (three) times daily. Patient taking differently: Apply 1 Application topically 3 (three) times daily as needed (for bilateral, below-the-knees skin irritation). 07/03/22  Yes Laurey Morale, MD  warfarin (COUMADIN) 5 MG tablet TAKE 1 TABLET BY MOUTH DAILY EXCEPT TAKE 1 1/2 TABLETS ON SUNDAYS, WEDNESDAYS, AND FRIDAYS OR AS DIRECTED BY ANTICOAGULATION CLINIC Patient taking differently: Take 5-7.5 mg by mouth See admin instructions. Take 7.5 mg by mouth in the evening on Sunday and 5 mg on Mon/Tues/Wed/Thurs/Fri/Sat 07/25/22  Yes Laurey Morale, MD  benzonatate (TESSALON) 100 MG capsule Take 1 capsule (100 mg total) by mouth every 8 (eight) hours. Patient not taking: Reported on 08/18/2022 07/19/22   Laurene Footman B, PA-C  glucose blood test strip 1 each by Other route as needed for other. accu chek aviva plusUse as instructed    [provider]  ketoconazole (NIZORAL) 2 % cream APPLY TO AFFECTED AREA TWICE DAILY AS NEEDED FOR IRRITATION Patient not taking: Reported on 08/18/2022 09/18/21   Laurey Morale, MD  promethazine-dextromethorphan (PROMETHAZINE-DM) 6.25-15 MG/5ML syrup Take 5 mLs by mouth 4 (four) times daily as needed. Patient not taking: Reported on 08/18/2022 07/19/22   Laurene Footman B, PA-C  tadalafil (CIALIS) 20 MG tablet Take 0.5-1 tablets (10-20 mg total) by mouth every other day as needed for erectile dysfunction. Patient not taking: Reported on 08/18/2022 08/08/22   Laurey Morale, MD    Physical Exam: Vitals:   08/18/22 1341 08/18/22 1648 08/18/22 1715 08/18/22 1730  BP:  (!) 152/69 (!) 167/73 (!) 167/90  Pulse:  80 76 74  Resp:  '20 20 20  '$ Temp:    98 F (36.7 C)  TempSrc:      SpO2:  95% 95% 95%  Weight: 130.2 kg     Height: '6\' 3"'$  (1.905 m)       Physical Exam Constitutional:      General: He is not in acute distress.    Appearance: Normal appearance. He is obese.  HENT:     Head: Normocephalic and atraumatic.     Mouth/Throat:     Mouth: Mucous membranes are moist.     Pharynx: Oropharynx is clear.  Eyes:     Extraocular Movements: Extraocular movements intact.     Pupils: Pupils are equal, round, and reactive to light.  Cardiovascular:     Rate and Rhythm: Regular rhythm. Tachycardia present.     Pulses: Normal pulses.     Heart sounds: Normal heart sounds.  Pulmonary:  Effort: Pulmonary effort is normal. No respiratory distress.     Breath sounds: Rales present.  Abdominal:     General: Bowel sounds are normal. There is no distension.     Palpations: Abdomen is soft.     Tenderness: There is no abdominal tenderness.  Musculoskeletal:        General: No swelling or deformity.  Skin:    General: Skin is warm and dry.  Neurological:     General: No focal deficit present.     Mental Status: Mental status is at baseline.    Labs on Admission: I have personally reviewed following labs and imaging studies  CBC: Recent Labs  Lab 08/18/22 1651  WBC 6.5  NEUTROABS 4.8  HGB 18.0*  HCT 54.7*  MCV 96.0  PLT 417    Basic Metabolic Panel: Recent Labs  Lab 08/18/22 1651  NA 138  K 4.5  CL 104  CO2 27  GLUCOSE 109*  BUN 10  CREATININE 0.95  CALCIUM 8.9    GFR: Estimated Creatinine Clearance: 115.7 mL/min (by C-G formula based on SCr of 0.95 mg/dL).  Liver Function Tests: Recent Labs  Lab 08/18/22 1651  AST 30  ALT 30  ALKPHOS 73  BILITOT 1.2  PROT 6.9  ALBUMIN 4.0    Urine analysis:    Component  Value Date/Time   COLORURINE YELLOW (A) 07/10/2017 1338   APPEARANCEUR CLEAR (A) 07/10/2017 1338   LABSPEC 1.016 07/10/2017 1338   PHURINE 5.0 07/10/2017 1338   GLUCOSEU 50 (A) 07/10/2017 1338   HGBUR NEGATIVE 07/10/2017 1338   HGBUR negative 11/23/2009 0844   BILIRUBINUR 1+ 11/18/2017 1600   KETONESUR NEGATIVE 07/10/2017 1338   PROTEINUR trace 11/18/2017 1600   PROTEINUR NEGATIVE 07/10/2017 1338   UROBILINOGEN 4.0 (A) 11/18/2017 1600   UROBILINOGEN 0.2 11/23/2009 0844   NITRITE N 11/18/2017 1600   NITRITE NEGATIVE 07/10/2017 1338   LEUKOCYTESUR Negative 11/18/2017 1600    Radiological Exams on Admission: CT Abdomen Pelvis W Contrast  Result Date: 08/18/2022 CLINICAL DATA:  Acute generalized abdominal pain. EXAM: CT ABDOMEN AND PELVIS WITH CONTRAST TECHNIQUE: Multidetector CT imaging of the abdomen and pelvis was performed using the standard protocol following bolus administration of intravenous contrast. RADIATION DOSE REDUCTION: This exam was performed according to the departmental dose-optimization program which includes automated exposure control, adjustment of the mA and/or kV according to patient size and/or use of iterative reconstruction technique. CONTRAST:  183m OMNIPAQUE IOHEXOL 300 MG/ML  SOLN COMPARISON:  August 16, 2022.  October 13, 2018. FINDINGS: Lower chest: Mild bibasilar opacities are noted concerning for atelectasis, infiltrates or possibly scarring. Hepatobiliary: No gallstones or biliary dilatation is noted. There is interval development a 6.5 x 5.2 cm heterogeneously enhancing mass in the right hepatic lobe which was not present on the prior MRI exam of 2019. This is highly concerning for neoplasm or malignancy. Pancreas: Unremarkable. No pancreatic ductal dilatation or surrounding inflammatory changes. Spleen: Normal in size without focal abnormality. Adrenals/Urinary Tract: Adrenal glands appear normal. Grossly stable multi-septated cystic lesion is seen involving  the upper pole the right kidney with peripheral calcifications. No hydronephrosis or renal obstruction is noted. Urinary bladder is unremarkable. Stomach/Bowel: Stomach is within normal limits. Appendix appears normal. No evidence of bowel wall thickening, distention, or inflammatory changes. Vascular/Lymphatic: Aortic atherosclerosis. Mildly enlarged periaortic adenopathy is noted with largest lymph node measuring 12 mm. Mildly enlarged adenopathy is also noted in the gastrohepatic ligament, but this is unchanged compared to prior exam. Reproductive: Prostate  is unremarkable. Other: No abdominal wall hernia or abnormality. No abdominopelvic ascites. Musculoskeletal: Status post left total hip arthroplasty. No acute osseous abnormality is noted. IMPRESSION: Interval development of 6.5 x 5.2 cm heterogeneously enhancing mass in right hepatic lobe. This is highly concerning for neoplasm or malignancy, and further evaluation with MRI is recommended. Mildly enlarged periaortic adenopathy is noted, with the largest lymph node measuring 12 mm. Metastatic disease cannot be excluded. Mildly enlarged adenopathy is also noted in the gastrohepatic ligament, but this is unchanged compared to prior exam. Stable bibasilar lung opacities are noted concerning for inflammation, atelectasis or possibly scarring. Grossly stable multi-septated cystic lesion is seen involving upper pole of right kidney with peripheral calcifications compared to prior exam of 2019, most likely representing benign etiology. Aortic Atherosclerosis (ICD10-I70.0). Electronically Signed   By: Marijo Conception M.D.   On: 08/18/2022 18:18    EKG: Independently reviewed.  Sinus rhythm at 82 bpm.  PVC noted.  Intraventricular conduction delay at 130 ms.  Nonspecific T wave flattening.  Baseline wander.  Assessment/Plan Principal Problem:   Pneumonia Active Problems:   Hyperlipemia   Venous (peripheral) insufficiency   ESOPHAGITIS, REFLUX   History of  hepatitis C   Morbid obesity (HCC)   DM2 (diabetes mellitus, type 2) (HCC)   Essential hypertension   DVT of deep femoral vein, right (HCC)   Lymphedema   Pneumonia > Patient CT scan showing findings consistent with multifocal pneumonia.  Imaging was discussed with pulmonology by EDP who agreed the findings were most consistent with pneumonia. > This is in the setting of ongoing chronic cough and treatment for pneumonia outpatient in September. > No leukocytosis and rales on exam raises the possibility of viral pneumonia, including COVID-19.  Will obtain screening labs for COVID-19, flu, full respiratory viral panel to evaluate for viral etiology of this multifocal pneumonia. > We will also obtain procalcitonin to further help delineate between possible viral versus bacterial pneumonia.  He is also afebrile which is different from his pneumonia in September which could raise the possibility of this being a different process as well. > Received vancomycin and cefepime in the ED - Monitor on telemetry overnight - Ceftriaxone and azithromycin pending viral studies - Follow-up respiratory viral panel and COVID-19 screen - Follow-up procalcitonin - Trend fever curve and WBC - Consider echo given borderline BNP if above work-up is unrevealing.  Hepatic mass Family history of hepatitis C > Patient noted to have hepatic mass captured on CT chest outpatient.  In the ED CT of the abdomen pelvis showed new right hepatic lesion concerning for neoplasm/malignancy with periaortic lymphadenopathy.   > Clarified with patient that he has a family history of hepatitis C.  His brother had this and patient had a biopsy as a part of his work-up over 20 years ago.  States he was on a 48-monthcourse of interferon which she believes was prophylactic.  He states he never was told that he had hepatitis C.  > CT read recommend MRI for further confirmation.  May need further work-up including possible IR consult for  biopsy while here pending MRI results.  LFTs stable. - MRI abdomen with and without contrast  Recurrent DVTs > History of recurrent DVT, positive lupus anticoagulant.  On chronic warfarin. - Warfarin per pharmacy  Hyperlipidemia - Continue home pravastatin  Hypertension - Continue home amlodipine  Diabetes - SSI  Obesity Lymphedema Venous insufficiency - Noted  DVT prophylaxis: Warfarin Code Status:   Full Family  Communication:  Updated at bedside Disposition Plan:   Patient is from:  Home  Anticipated DC to:  Home  Anticipated DC date:  To 2 days  Anticipated DC barriers: None  Consults called:  None Admission status:  Observation, telemetry  Severity of Illness: The appropriate patient status for this patient is OBSERVATION. Observation status is judged to be reasonable and necessary in order to provide the required intensity of service to ensure the patient's safety. The patient's presenting symptoms, physical exam findings, and initial radiographic and laboratory data in the context of their medical condition is felt to place them at decreased risk for further clinical deterioration. Furthermore, it is anticipated that the patient will be medically stable for discharge from the hospital within 2 midnights of admission.    Marcelyn Bruins MD [facility]  How to contact the [facility] Attending or Consulting provider [place] or covering provider during after hours [place], for this patient?   Check the care team in [facility] and look for a) attending/consulting [facility] provider listed and b) the [facility] team listed Log into [email or web address] and use [place]'s universal password to access. If you do not have the password, please contact the hospital operator. Locate the [facility] provider you are looking for under [facility] and page to a number that you can be directly reached. If you still have difficulty reaching the provider, please page the [facility] (Director on Call) for the  Hospitalists listed on amion for assistance.  08/18/2022, 7:13 PM

## 2022-08-18 NOTE — ED Provider Notes (Signed)
[place] DEPT Provider Note   CSN: [id number] Arrival date & time: 08/18/22  1329     History  Chief Complaint  Patient presents with   Cough    Shortness of Breath   Shortness of Breath    Christopher Marsh is a 61 y.o. male.  HPI      61yo male with history of DM, htn, hlpd, DVT RLE 11/2021, positive lupus anticoagulant on coumadin, recent treatment for LLL pneumonia 07/19/2022 with augmentin/azithromycin, who presents with concern for continued cough and fatigue and CT completed as an outpatient showing multifocal airspace disease most compatible with pneumonia, and new hepatic lesions/enlarging lymph nodes.   Had CT chest which was completed as an outpatient which showed multifocal airspace disease in both lungs, left greater than right most compatible with multifocal pneumonia, multiple new hepatic lesions with enlarging lymph nodes in the upper abdomen and chest concerning for metastatic disease however based on possible full multifocal pneumonia hepatic abscesses would also be in the differential, pulmonary artery enlargement associated with pulmonary hypertension, coronary artery calcifications  He was previously diagnosed with a left lower lobe pneumonia and given Augmentin and azithromycin September 21.  Cough for 4-5 months, took mom to appointment in September and was not feeling well, was diagnosed in September wint pneuomnia, felt somewhat better but has had chronic cough, constant rattle in chest. On coumadin, levels were affected by the abx, but improved now.  Has been off of abx since the abx course one month ago  No fevers (did in September, not anymore) Shortness of breath with exertion, noted it while golfing yesterday No chest pain No syncope No new leg pain or swelling, had previous DVT 23 yrs ago on left, in feb on right No abdominal pain  Smoking high school and college, quit 41 years ago  3 years since last  colonoscopy Grandparent with colon cancer Mother and her twin with breast ca  Past Medical History:  Diagnosis Date   Arthritis    Diabetes mellitus without complication ([place])    DVT (deep venous thrombosis) (HCC)    Lt leg   Dyspnea    Elevated lipids    Hyperlipidemia    Hypertension    Lower extremity edema      Home Medications Prior to Admission medications   Medication Sig Start Date End Date Taking? Authorizing Provider  albuterol (VENTOLIN HFA) 108 (90 Base) MCG/ACT inhaler Inhale 2 puffs into the lungs every 4 (four) hours as needed. 05/11/22   Laurey Morale, MD  amLODipine (NORVASC) 5 MG tablet Take 1 tablet (5 mg total) by mouth daily. 08/08/22   Laurey Morale, MD  azithromycin (ZITHROMAX) 250 MG tablet Take 1 tablet (250 mg total) by mouth daily. Take first 2 tablets together, then 1 every day until finished. 07/19/22   Danton Clap, PA-C  benzonatate (TESSALON) 100 MG capsule Take 1 capsule (100 mg total) by mouth every 8 (eight) hours. 07/19/22   Laurene Footman B, PA-C  glipiZIDE (GLUCOTROL) 10 MG tablet Take 1 tablet (10 mg total) by mouth 2 (two) times daily before a meal. 08/08/22   Laurey Morale, MD  glucose blood test strip 1 each by Other route as needed for other. accu chek aviva plusUse as instructed    [provider]  ketoconazole (NIZORAL) 2 % cream APPLY TO AFFECTED AREA TWICE DAILY AS NEEDED FOR IRRITATION 09/18/21   Laurey Morale, MD  metFORMIN (GLUCOPHAGE) 500 MG tablet  TAKE TWO TABLETS BY MOUTH EVERY MORNING WITH BREAKFAST 08/06/22   Laurey Morale, MD  pravastatin (PRAVACHOL) 20 MG tablet Take 1 tablet (20 mg total) by mouth daily. 08/08/22   Laurey Morale, MD  promethazine-dextromethorphan (PROMETHAZINE-DM) 6.25-15 MG/5ML syrup Take 5 mLs by mouth 4 (four) times daily as needed. 07/19/22   Danton Clap, PA-C  tadalafil (CIALIS) 20 MG tablet Take 0.5-1 tablets (10-20 mg total) by mouth every other day as needed for erectile dysfunction.  08/08/22   Laurey Morale, MD  triamcinolone cream (KENALOG) 0.1 % Apply 1 Application topically 3 (three) times daily. 07/03/22   Laurey Morale, MD  warfarin (COUMADIN) 5 MG tablet TAKE 1 TABLET BY MOUTH DAILY EXCEPT TAKE 1 1/2 TABLETS ON SUNDAYS, WEDNESDAYS, AND FRIDAYS OR AS DIRECTED BY ANTICOAGULATION CLINIC 07/25/22   Laurey Morale, MD      Allergies    Patient has no known allergies.    Review of Systems   Review of Systems  Physical Exam Updated Vital Signs BP (!) 167/90   Pulse 74   Temp 98 F (36.7 C)   Resp 20   Ht '6\' 3"'$  (1.905 m)   Wt 130.2 kg   SpO2 95%   BMI 35.87 kg/m  Physical Exam Vitals and nursing note reviewed.  Constitutional:      General: He is not in acute distress.    Appearance: He is well-developed. He is not diaphoretic.  HENT:     Head: Normocephalic and atraumatic.  Eyes:     Conjunctiva/sclera: Conjunctivae normal.  Cardiovascular:     Rate and Rhythm: Normal rate and regular rhythm.     Heart sounds: Normal heart sounds. No murmur heard.    No friction rub. No gallop.  Pulmonary:     Effort: Pulmonary effort is normal. No respiratory distress.     Breath sounds: Examination of the left-lower field reveals rhonchi. Rhonchi present. No wheezing or rales.  Abdominal:     General: There is no distension.     Palpations: Abdomen is soft.     Tenderness: There is no abdominal tenderness. There is no guarding.  Musculoskeletal:     Cervical back: Normal range of motion.  Skin:    General: Skin is warm and dry.  Neurological:     Mental Status: He is alert and oriented to person, place, and time.     ED Results / Procedures / Treatments   Labs (all labs ordered are listed, but only abnormal results are displayed) Labs Reviewed  COMPREHENSIVE METABOLIC PANEL - Abnormal; Notable for the following components:      Result Value   Glucose, Bld 109 (*)    All other components within normal limits  CBC WITH DIFFERENTIAL/PLATELET - Abnormal;  Notable for the following components:   Hemoglobin 18.0 (*)    HCT 54.7 (*)    All other components within normal limits  PROTIME-INR - Abnormal; Notable for the following components:   Prothrombin Time 25.8 (*)    INR 2.4 (*)    All other components within normal limits  CULTURE, BLOOD (ROUTINE X 2)  CULTURE, BLOOD (ROUTINE X 2)  LACTIC ACID, PLASMA  BRAIN NATRIURETIC PEPTIDE  TROPONIN I (HIGH SENSITIVITY)    EKG None  Radiology No results found.  Procedures Procedures    Medications Ordered in ED Medications  vancomycin (VANCOREADY) IVPB 2000 mg/400 mL (has no administration in time range)  ceFEPIme (MAXIPIME) 2 g in sodium chloride 0.9 %  100 mL IVPB (2 g Intravenous New Bag/Given 08/18/22 1808)  iohexol (OMNIPAQUE) 300 MG/ML solution 100 mL (100 mLs Intravenous Contrast Given 08/18/22 1742)    ED Course/ Medical Decision Making/ A&P                            62yo male with history of DM, htn, hlpd, DVT RLE 11/2021, positive lupus anticoagulant on coumadin, cough for 5 months with recent treatment for LLL pneumonia 07/19/2022 with augmentin/azithromycin, who presents with concern for continued cough and fatigue and CT completed as an outpatient showing multifocal airspace disease most compatible with pneumonia, and new hepatic lesions/enlarging lymph nodes.  DDx includes pneumonia, CHF, malignancy. Lower suspicion for acute PE on coumadin with chronic symptoms.  Labs completed and personally abided interpreted by me show an INR of 2.4 with a patient on Coumadin, CMP without significant electrolyte abnormalities, CBC without leukocytosis, and with polycythemia which has been noted previously, and with a normal lactic acid.  Given findings on CT chest, CT abdomen pelvis was ordered for further evaluation of hepatic lesions/ r/o abscess vs metastases.   Ordered blood cx, vanc/cefepime for treatment of pneumonia.  Discussed with pulmonology, do feel CT chest looks most  consistent with infection and likely needs IR guided hepatic biopsy.  CT completed of abdomen shows enhancing mass right hepatic lobe concerning for neoplasm or malignancy, further eval with MRI recommended, lymphadenopathy periaortic, cannot exclude metastatic disaease,  stable cystic lesions of kidneys.   Will admit for IV abx for multifocal pneumonia, further evaluation of liver lesion.          Final Clinical Impression(s) / ED Diagnoses Final diagnoses:  Multifocal pneumonia  Hepatic lesion    Rx / DC Orders ED Discharge Orders     None         Gareth Morgan, MD 08/18/22 Valerie Roys

## 2022-08-18 NOTE — ED Notes (Signed)
Per lab 2nd set of blood cultures did not have a time of draw or site location. Second set will need redraw

## 2022-08-18 NOTE — ED Provider Triage Note (Signed)
Emergency Medicine Provider Triage Evaluation Note  Christopher Marsh , a 61 y.o. male  was evaluated in triage.  Pt complains of cough and shortness of breath x 1 month. Seen by urgent care about a month ago and CXR showed pneumonia in left lower lobe. Given zpack and amoxicillin. Saw PCP a few days ago for physical, was complaining of continued SOB and cough. PCP sent for chest CT with concern for emphysema. Called him today with findings of multifocal pneumonia.   Review of Systems  Positive: Cough, SOB, DOE Negative: Fever, CP  Physical Exam  BP (!) 167/71 (BP Location: Left Arm)   Pulse 76   Temp 98.2 F (36.8 C) (Oral)   Resp 18   Ht '6\' 3"'$  (1.905 m)   Wt 130.2 kg   SpO2 95%   BMI 35.87 kg/m  Gen:   Awake, no distress   Resp:  Normal effort  MSK:   Moves extremities without difficulty  Other:    Medical Decision Making  Medically screening exam initiated at 3:13 PM.  Appropriate orders placed.  Christopher Marsh was informed that the remainder of the evaluation will be completed by another provider, this initial triage assessment does not replace that evaluation, and the importance of remaining in the ED until their evaluation is complete.  Chest CT from 10/19 -- multifocal airspace disease in bot lungs, L > R. Multiple new hepatic legions and enlarging lymph nodes favoring metastatic disease vs hepatic abscesses  Workup initiated   Christopher Marsh T, PA-C 08/18/22 1515

## 2022-08-18 NOTE — Progress Notes (Signed)
[facility] for warfarin Indication: hx recurrent DVT  No Known Allergies  Patient Measurements: Height: '6\' 3"'$  (190.5 cm) Weight: 130.2 kg (287 lb) IBW/kg (Calculated) : 84.5 Heparin Dosing Weight:   Vital Signs: Temp: 98 F (36.7 C) (10/21 1730) Temp Source: Oral (10/21 1338) BP: 167/90 (10/21 1730) Pulse Rate: 74 (10/21 1730)  Labs: Recent Labs    08/18/22 1651 08/18/22 1728 08/18/22 1732  HGB 18.0*  --   --   HCT 54.7*  --   --   PLT 215  --   --   LABPROT  --  25.8*  --   INR  --  2.4*  --   CREATININE 0.95  --   --   TROPONINIHS  --   --  8    Estimated Creatinine Clearance: 115.7 mL/min (by C-G formula based on SCr of 0.95 mg/dL).   Medications:  PTA warfarin regimen: 5 mg daily except 7.5 mg on Sunday (last dose taken on 08/18/22 at 2p)  Assessment: Patient is a 61 y.o M with hx lupus and recurrent DVT on warfarin PTA who presented to the ED on 08/18/22 further workup after chest CT done outpatient for evaluation of cough/fatigue showed findings consistent with multifocal pneumonia as well as multiple new hepatic lesions  with enlarging lymph nodes  that's concerning for metastatic disease.  Pharmacy as been consulted to resume warfarin.  Today, 08/18/2022: - INR  is therapeutic at 2.4 - cbc ok - no bleeding documented  Goal of Therapy:  INR 2-3 Monitor platelets by anticoagulation protocol: Yes   Plan:  - Pt has already taken warfarin dose for today PTA - daily INR  Galilea Quito P 08/18/2022,7:15 PM

## 2022-08-19 ENCOUNTER — Observation Stay (HOSPITAL_COMMUNITY): Payer: BC Managed Care – PPO

## 2022-08-19 ENCOUNTER — Encounter (HOSPITAL_COMMUNITY): Payer: Self-pay | Admitting: Internal Medicine

## 2022-08-19 DIAGNOSIS — D751 Secondary polycythemia: Secondary | ICD-10-CM

## 2022-08-19 DIAGNOSIS — B182 Chronic viral hepatitis C: Secondary | ICD-10-CM | POA: Diagnosis not present

## 2022-08-19 DIAGNOSIS — R0602 Shortness of breath: Secondary | ICD-10-CM | POA: Diagnosis not present

## 2022-08-19 DIAGNOSIS — I509 Heart failure, unspecified: Secondary | ICD-10-CM | POA: Diagnosis not present

## 2022-08-19 DIAGNOSIS — R0683 Snoring: Secondary | ICD-10-CM | POA: Diagnosis not present

## 2022-08-19 DIAGNOSIS — I82411 Acute embolism and thrombosis of right femoral vein: Secondary | ICD-10-CM | POA: Diagnosis not present

## 2022-08-19 DIAGNOSIS — R160 Hepatomegaly, not elsewhere classified: Secondary | ICD-10-CM

## 2022-08-19 DIAGNOSIS — I10 Essential (primary) hypertension: Secondary | ICD-10-CM | POA: Diagnosis not present

## 2022-08-19 DIAGNOSIS — K7689 Other specified diseases of liver: Secondary | ICD-10-CM | POA: Diagnosis not present

## 2022-08-19 DIAGNOSIS — J189 Pneumonia, unspecified organism: Secondary | ICD-10-CM | POA: Diagnosis present

## 2022-08-19 DIAGNOSIS — E119 Type 2 diabetes mellitus without complications: Secondary | ICD-10-CM | POA: Diagnosis not present

## 2022-08-19 DIAGNOSIS — F5101 Primary insomnia: Secondary | ICD-10-CM | POA: Diagnosis not present

## 2022-08-19 DIAGNOSIS — R590 Localized enlarged lymph nodes: Secondary | ICD-10-CM | POA: Diagnosis not present

## 2022-08-19 DIAGNOSIS — K769 Liver disease, unspecified: Secondary | ICD-10-CM | POA: Diagnosis present

## 2022-08-19 LAB — RESPIRATORY PANEL BY PCR

## 2022-08-19 LAB — COMPREHENSIVE METABOLIC PANEL
ALT: 24 U/L (ref 0–44)
AST: 26 U/L (ref 15–41)
Albumin: 3.1 g/dL — ABNORMAL LOW (ref 3.5–5.0)
Alkaline Phosphatase: 57 U/L (ref 38–126)
Anion gap: 7 (ref 5–15)
BUN: 8 mg/dL (ref 8–23)
CO2: 26 mmol/L (ref 22–32)
Calcium: 8.2 mg/dL — ABNORMAL LOW (ref 8.9–10.3)
Chloride: 106 mmol/L (ref 98–111)
Creatinine, Ser: 0.8 mg/dL (ref 0.61–1.24)
GFR, Estimated: >60 mL/min (ref >60)
Glucose, Bld: 122 mg/dL — ABNORMAL HIGH (ref 70–99)
Potassium: 4.1 mmol/L (ref 3.5–5.1)
Sodium: 139 mmol/L (ref 135–145)
Total Bilirubin: 0.9 mg/dL (ref 0.3–1.2)
Total Protein: 5.4 g/dL — ABNORMAL LOW (ref 6.5–8.1)

## 2022-08-19 LAB — CBC
HCT: 47.8 % (ref 39.0–52.0)
Hemoglobin: 15.9 g/dL (ref 13.0–17.0)
MCH: 31.6 pg (ref 26.0–34.0)
MCHC: 33.3 g/dL (ref 30.0–36.0)
MCV: 95 fL (ref 80.0–100.0)
Platelets: 179 10*3/uL (ref 150–400)
RBC: 5.03 MIL/uL (ref 4.22–5.81)
RDW: 13.2 % (ref 11.5–15.5)
WBC: 3.9 10*3/uL — ABNORMAL LOW (ref 4.0–10.5)
nRBC: 0 % (ref 0.0–0.2)

## 2022-08-19 LAB — ECHOCARDIOGRAM COMPLETE
AR max vel: 2.47 cm2
AV Area VTI: 2.97 cm2
AV Area mean vel: 2.4 cm2
AV Mean grad: 7.5 mmHg
AV Peak grad: 13.4 mmHg
Ao pk vel: 1.83 m/s
Area-P 1/2: 3.31 cm2
Calc EF: 55.7 %
Height: 75 in
MV M vel: 1.47 m/s
MV Peak grad: 8.6 mmHg
S' Lateral: 2.9 cm
Single Plane A2C EF: 48.9 %
Single Plane A4C EF: 60.7 %
Weight: 4592 oz

## 2022-08-19 LAB — PROTIME-INR
INR: 2.6 — ABNORMAL HIGH (ref 0.8–1.2)
Prothrombin Time: 27.5 seconds — ABNORMAL HIGH (ref 11.4–15.2)

## 2022-08-19 LAB — GLUCOSE, CAPILLARY
Glucose-Capillary: 101 mg/dL — ABNORMAL HIGH (ref 70–99)
Glucose-Capillary: 116 mg/dL — ABNORMAL HIGH (ref 70–99)
Glucose-Capillary: 153 mg/dL — ABNORMAL HIGH (ref 70–99)
Glucose-Capillary: 176 mg/dL — ABNORMAL HIGH (ref 70–99)

## 2022-08-19 LAB — HIV ANTIBODY (ROUTINE TESTING W REFLEX): HIV Screen 4th Generation wRfx: NONREACTIVE

## 2022-08-19 LAB — PROCALCITONIN: Procalcitonin: <0.1 ng/mL

## 2022-08-19 MED ORDER — SODIUM CHLORIDE 0.9 % IV SOLN
2.0000 g | INTRAVENOUS | Status: DC
  Administered 2022-08-19 – 2022-08-20 (×2): 2 g via INTRAVENOUS
  Filled 2022-08-19 (×2): qty 20

## 2022-08-19 MED ORDER — WARFARIN - PHARMACIST DOSING INPATIENT: Freq: Every day | Status: DC

## 2022-08-19 MED ORDER — GADOBUTROL 1 MMOL/ML IV SOLN
10.0000 mL | Freq: Once | INTRAVENOUS | Status: AC | PRN
  Administered 2022-08-19: 10 mL via INTRAVENOUS

## 2022-08-19 MED ORDER — PERFLUTREN LIPID MICROSPHERE
1.0000 mL | INTRAVENOUS | Status: AC | PRN
  Administered 2022-08-19: 2 mL via INTRAVENOUS

## 2022-08-19 MED ORDER — WARFARIN SODIUM 5 MG PO TABS: 7.5000 mg | ORAL_TABLET | Freq: Once | ORAL | Status: DC

## 2022-08-19 NOTE — Progress Notes (Signed)
  Echocardiogram 2D Echocardiogram has been performed.  Lana Fish 08/19/2022, 1:15 PM

## 2022-08-19 NOTE — Progress Notes (Signed)
[facility]                                                                              Christopher Marsh, is a 61 y.o. male, DOB - 09/10/1961, ZGY:[id number] Admit date - 08/18/2022    Outpatient Primary MD for the patient is Laurey Morale, MD  LOS - 0  days  Chief Complaint  Patient presents with   Cough    Shortness of Breath   Shortness of Breath       Brief summary   Patient is a 61 year old male with hyperlipidemia, obesity, venous insufficiency, lymphedema, esophagitis, hepatitis C, diabetes mellitus, recurrent DVTs presented with cough and shortness of breath.  Patient reported cough for the past 4 to 5 months with shortness of breath.  He did have an episode in September this year when he was seen at urgent care with fevers, productive cough and shortness of breath.  He was diagnosed with LLL pneumonia and prescribed Augmentin and a Zithromax.  He did have some improvement but continued to have chronic cough. Outpatient CT of the chest was ordered which showed multifocal pneumonia and liver masses. Patient presented to ED for further evaluation. SBP 150s to 160s.  Hemoglobin 18.  INR 2.4.  Troponin normal.  BNP 102. CT abdomen pelvis showed new mass in the right hepatic lobe concerning for malignancy.  Periaortic lymphadenopathy noted.  Stable basilar opacities representing infection/inflammation versus atelectasis versus scarring. Pulmonology was consulted. Admitted for further work-up  Assessment & Plan    Principal Problem:   Multifocal pneumonia with shortness of breath -Presented with persistent cough, shortness of breath, CT chest 10/19 showed multifocal airspace disease in both lungs left greater than right compatible with multifocal pneumonia.  Patient also reported dyspnea on exertion, fatigue worsening in the last 1 year -Continue IV Zithromax, Rocephin, add flutter valve -Procalcitonin less than 0.1 -Influenza panel negative, COVID-negative, HIV  negative -Pulmonology was consulted, will follow recommendations -BNP 102.2, no cardiac work-up in epic, will order 2D echo for further work-up.  Troponins negative   Active Problems:   Hepatic lesion, underlying history of ?hepatitis C -CT abdomen pelvis showed 6.5x 5.2 cm heterogeneously and Hunsinger mass in the right hepatic lobe concerning for neoplasm.  Mildly enlarged periaortic lymphadenopathy largest lymph node measuring 12 mm, cystic lesion upper pole of right kidney, likely benign. -Per patient, he has a family history of hepatitis C and had 11-monthcourse of interferon which he believed was prophylactic, was never told he had hepatitis C.  -LFTs normal, no abdominal pain.  -Follow MRCP.  Pending results, will likely consult IR for liver biopsy  Recurrent DVTs, in the setting of positive lupus anticoagulant -On chronic warfarin -Will hold warfarin, will likely need liver biopsy    Hyperlipemia -LFTs normal, continue statin  GERD, history of esophagitis gastritis -Not on PPI outpatient      DM2 (diabetes mellitus, type 2) ([place], NIDDM, controlled -Hold metformin, glipizide, -Continue sliding scale insulin while inpatient -HbA1c 5.8    Essential hypertension -Continue amlodipine  Obesity, venous insufficiency Estimated body mass index is 35.87 kg/m as calculated from the following:  Height as of this encounter: '6\' 3"'$  (1.905 m).   Weight as of this encounter: 130.2 kg.  Code Status: Full code DVT Prophylaxis:    INR therapeutic, hold warfarin  For possible liver biopsy in a.m.  Level of Care: Level of care: Telemetry Family Communication: Updated patient's wife at the bedside   Disposition Plan:      Remains inpatient appropriate: Work-up in progress   Procedures:    Consultants:   Pulmonology  Antimicrobials:   Anti-infectives (From admission, onward)    Start     Dose/Rate Route Frequency Ordered Stop   08/18/22 2200  cefTRIAXone (ROCEPHIN) 1 g in  sodium chloride 0.9 % 100 mL IVPB        1 g 200 mL/hr over 30 Minutes Intravenous Every 24 hours 08/18/22 1950     08/18/22 2200  azithromycin (ZITHROMAX) 250 mg in dextrose 5 % 125 mL IVPB        250 mg 127.5 mL/hr over 60 Minutes Intravenous Every 24 hours 08/18/22 1950     08/18/22 1830  vancomycin (VANCOREADY) IVPB 2000 mg/400 mL        2,000 mg 200 mL/hr over 120 Minutes Intravenous  Once 08/18/22 1738 08/18/22 2042   08/18/22 1745  ceFEPIme (MAXIPIME) 2 g in sodium chloride 0.9 % 100 mL IVPB        2 g 200 mL/hr over 30 Minutes Intravenous  Once 08/18/22 1738 08/18/22 1842          Medications  amLODipine  5 mg Oral Daily   insulin aspart  0-15 Units Subcutaneous TID WC   pravastatin  20 mg Oral Daily   sodium chloride flush  3 mL Intravenous Q12H      Subjective:   Christopher Marsh was seen and examined today.  Chronic cough, shortness of breath.  No fevers or chills. Patient denies dizziness, chest pain, abdominal pain, N/V/D/C, new weakness. No acute events overnight.    Objective:   Vitals:   08/18/22 2237 08/19/22 0306 08/19/22 0655 08/19/22 0747  BP: (!) 180/73 (!) 166/67 (!) 164/75 (!) 157/66  Pulse: 90 73 70 68  Resp: 20   18  Temp: 98.1 F (36.7 C) 98.1 F (36.7 C) 98.6 F (37 C) 98 F (36.7 C)  TempSrc: Oral Oral Oral Oral  SpO2: 94% 97% 93% 93%  Weight:      Height:        Intake/Output Summary (Last 24 hours) at 08/19/2022 0955 Last data filed at 08/19/2022 8527 Gross per 24 hour  Intake 328 ml  Output 850 ml  Net -522 ml     Wt Readings from Last 3 Encounters:  08/18/22 130.2 kg  08/08/22 131.1 kg  07/19/22 127.9 kg     Exam General: Alert and oriented x 3, NAD Cardiovascular: S1 S2 auscultated,  RRR Respiratory: Diminished breath sound at the bases with rhonchi R>L Gastrointestinal: Soft, nontender, nondistended, + bowel sounds Ext: no pedal edema bilaterally Neuro: Strength 5/5 upper and lower extremities bilaterally Psych:  Normal affect and demeanor, alert and oriented x3     Data Reviewed:  I have personally reviewed following labs    CBC Lab Results  Component Value Date   WBC 3.9 (L) 08/19/2022   RBC 5.03 08/19/2022   HGB 15.9 08/19/2022   HCT 47.8 08/19/2022   MCV 95.0 08/19/2022   MCH 31.6 08/19/2022   PLT 179 08/19/2022   MCHC 33.3 08/19/2022   RDW 13.2 08/19/2022   LYMPHSABS  0.8 08/18/2022   MONOABS 0.8 08/18/2022   EOSABS 0.1 08/18/2022   BASOSABS 0.1 87/86/7672     Last metabolic panel Lab Results  Component Value Date   NA 139 08/19/2022   K 4.1 08/19/2022   CL 106 08/19/2022   CO2 26 08/19/2022   BUN 8 08/19/2022   CREATININE 0.80 08/19/2022   GLUCOSE 122 (H) 08/19/2022   GFRNONAA >60 08/19/2022   GFRAA >60 07/24/2017   CALCIUM 8.2 (L) 08/19/2022   PROT 5.4 (L) 08/19/2022   ALBUMIN 3.1 (L) 08/19/2022   BILITOT 0.9 08/19/2022   ALKPHOS 57 08/19/2022   AST 26 08/19/2022   ALT 24 08/19/2022   ANIONGAP 7 08/19/2022    CBG (last 3)  Recent Labs    08/19/22 0742  GLUCAP 101*      Coagulation Profile: Recent Labs  Lab 08/18/22 1728 08/19/22 0409  INR 2.4* 2.6*     Radiology Studies: I have personally reviewed the imaging studies  CT Abdomen Pelvis W Contrast  Result Date: 08/18/2022 CLINICAL DATA:  Acute generalized abdominal pain. EXAM: CT ABDOMEN AND PELVIS WITH CONTRAST TECHNIQUE: Multidetector CT imaging of the abdomen and pelvis was performed using the standard protocol following bolus administration of intravenous contrast. RADIATION DOSE REDUCTION: This exam was performed according to the departmental dose-optimization program which includes automated exposure control, adjustment of the mA and/or kV according to patient size and/or use of iterative reconstruction technique. CONTRAST:  181m OMNIPAQUE IOHEXOL 300 MG/ML  SOLN COMPARISON:  August 16, 2022.  October 13, 2018. FINDINGS: Lower chest: Mild bibasilar opacities are noted concerning for  atelectasis, infiltrates or possibly scarring. Hepatobiliary: No gallstones or biliary dilatation is noted. There is interval development a 6.5 x 5.2 cm heterogeneously enhancing mass in the right hepatic lobe which was not present on the prior MRI exam of 2019. This is highly concerning for neoplasm or malignancy. Pancreas: Unremarkable. No pancreatic ductal dilatation or surrounding inflammatory changes. Spleen: Normal in size without focal abnormality. Adrenals/Urinary Tract: Adrenal glands appear normal. Grossly stable multi-septated cystic lesion is seen involving the upper pole the right kidney with peripheral calcifications. No hydronephrosis or renal obstruction is noted. Urinary bladder is unremarkable. Stomach/Bowel: Stomach is within normal limits. Appendix appears normal. No evidence of bowel wall thickening, distention, or inflammatory changes. Vascular/Lymphatic: Aortic atherosclerosis. Mildly enlarged periaortic adenopathy is noted with largest lymph node measuring 12 mm. Mildly enlarged adenopathy is also noted in the gastrohepatic ligament, but this is unchanged compared to prior exam. Reproductive: Prostate is unremarkable. Other: No abdominal wall hernia or abnormality. No abdominopelvic ascites. Musculoskeletal: Status post left total hip arthroplasty. No acute osseous abnormality is noted. IMPRESSION: Interval development of 6.5 x 5.2 cm heterogeneously enhancing mass in right hepatic lobe. This is highly concerning for neoplasm or malignancy, and further evaluation with MRI is recommended. Mildly enlarged periaortic adenopathy is noted, with the largest lymph node measuring 12 mm. Metastatic disease cannot be excluded. Mildly enlarged adenopathy is also noted in the gastrohepatic ligament, but this is unchanged compared to prior exam. Stable bibasilar lung opacities are noted concerning for inflammation, atelectasis or possibly scarring. Grossly stable multi-septated cystic lesion is seen  involving upper pole of right kidney with peripheral calcifications compared to prior exam of 2019, most likely representing benign etiology. Aortic Atherosclerosis (ICD10-I70.0). Electronically Signed   By: JMarijo ConceptionM.D.   On: 08/18/2022 18:18       Verdun Rackley M.D. [facility] 08/19/2022, 9:55 AM  Available via [employer]  secure chat 7am-7pm After 7 pm, please refer to night coverage provider listed on amion.

## 2022-08-19 NOTE — Progress Notes (Signed)
Interventional Radiology Brief Note:  Patient admitted with cough, shortness of breath and concern for multifocal pneumonia on outpatient CT.  Liver masses incidentally noted.  MR Abdomen performed also showing numerous hypervascular masses suspicious for HCC. IR consulted for liver mass biopsy.  Patient case reviewed by Dr. Vernard Gambles who approves patient for biopsy as either inpatient or outpatient. He is on coumadin at home which will need to be held for 5 days prior to biopsy.  Last dose 08/18/22.   If patient discharges home during coumadin hold, please place order for outpatient biopsy.  Otherwise IR will follow along for coordination of inpatient biopsy as schedule allows.   Brynda Greathouse, MS RD PA-C

## 2022-08-19 NOTE — Final Consult Note (Signed)
NAME:  Christopher Marsh, MRN:  [id number], DOB:  11/01/1960, LOS: 0 ADMISSION DATE:  08/18/2022, CONSULTATION DATE:  08/19/2022 REFERRING MD:  Dr. Billy Fischer, ER, CHIEF COMPLAINT:  Pneumonia  History of Present Illness:  61 yo male with very remote hx of smoking was seen in ER in September 2023 for headache, cough, dyspnea, and fatigue.  He was found to have Left lower lung treated community acquired pneumonia and treated with augmentin and zithromax.  He say his PCP on 08/08/22 and reported symptomatic improvement with only residual non-productive cough.  Noted to still have abnormal lung sounds and concern for emphysema, so CT chest as ordered.  This showed diffuse ASD in Lt lower lobe and some aspects of Lt upper lobe, and Rt upper and lower lobes concerning for multifocal pneumonia.  Also found to have 6.5 x 5.2 cm mass in Rt hepatic lobe concerning for malignancy.  He was advised by his PCP to come to the hospital for admission.  PCCM consulted to assist with assessment of persistent pulmonary air space disease.    Pertinent  Medical History  OA, DM type 2, recurrent DVT, HLD, HTN, Antiphospholipid Ab syndrome, Erythrocytosis, COVID 16 August 2020, Multinodular thyroid  Significant Hospital Events: Including procedures, antibiotic start and stop dates in addition to other pertinent events   10/21 Admit, start ABx  Interim History / Subjective:  He has persistent cough with clear sputum since he had pneumonia in September.  He was not having fever, sweats or chills.  He has lost about 20 lbs over the past few months, but he also changed his diet.  He doesn't feel like his breathing has limited his activity level.  He quit smoking tobacco cigarettes in his 20's.  He drinks alcohol at night to help sleep.  He snores, and there is concern he could have sleep apnea.  He tried [place] before for sleep and this helped.  He also tried xanax, but this caused a hangover effect.  He tried CBD gummies, but  this made him feel funny.  More recently he has been smoking marijuana to help sleep.  He tried electronic cigarettes a couple of times several months ago, but these made his chest burn so he stopped.  He denies exposure to mold.  No recent travel or sick exposure.  No exposure to animals or birds.  Objective   Blood pressure (!) 157/66, pulse 68, temperature 98 F (36.7 C), temperature source Oral, resp. rate 18, height '6\' 3"'$  (1.905 m), weight 130.2 kg, SpO2 93 %.    FiO2 (%):  [21 %] 21 %   Intake/Output Summary (Last 24 hours) at 08/19/2022 1884 Last data filed at 08/19/2022 1660 Gross per 24 hour  Intake 328 ml  Output 850 ml  Net -522 ml   Filed Weights   08/18/22 1341  Weight: 130.2 kg    Examination:  General - alert Eyes - pupils reactive ENT - no sinus tenderness, no stridor Cardiac - regular rate/rhythm, no murmur Chest - bibasilar crackles Abdomen - soft, non tender, + bowel sounds Extremities - 2+ edema of lower legs bilaterally Skin - venous stasis changes of lower legs Lt > Rt Neuro - normal strength, moves extremities, follows commands Psych - normal mood and behavior Lymphatics - no lymphadenopathy  Resolved Hospital Problem list     Assessment & Plan:   Persistent pulmonary infiltrates. - radiographic appearance is suggestive of recurrent pneumonia although his symptom constellation is minimal at this time - continue  broad spectrum antibiotics, although procalcitonin has been negative - f/u blood cultures and respiratory viral panel - will repeat 2 view chest xray on 08/20/22 - if he doesn't improve then he would need further assessment with lab serologies and possibly bronchoscopy  Erythrocytosis. - there has been concern for possible sleep apnea - will have him try auto CPAP while in hospital - might need further sleep assessment as an outpt  Insomnia. - discussed concern about using alcohol as a sedative agent to help with sleep  Liver  mass. - he has MRI of the abdomen scheduled for 08/19/22 - check hepatitis B and C antibodies  Recurrent DVT with Antiphospholipid syndrome and post-thrombotic syndrome. - anticoagulation per primary team - followed by Dr. Earlie Server with hematology as an outpt  Leukopenia. - f/u CBC with diff  Best Practice (right click and "Reselect all SmartList Selections" daily)   Diet/type: Regular consistency (see orders) DVT prophylaxis: Coumadin GI prophylaxis: N/A Lines: N/A Foley:  N/A Code Status:  full code Last date of multidisciplinary goals of care discussion [updated his wife at bedside]  Labs   CBC: Recent Labs  Lab 08/18/22 1651 08/19/22 0409  WBC 6.5 3.9*  NEUTROABS 4.8  --   HGB 18.0* 15.9  HCT 54.7* 47.8  MCV 96.0 95.0  PLT 215 789    Basic Metabolic Panel: Recent Labs  Lab 08/18/22 1651 08/19/22 0409  NA 138 139  K 4.5 4.1  CL 104 106  CO2 27 26  GLUCOSE 109* 122*  BUN 10 8  CREATININE 0.95 0.80  CALCIUM 8.9 8.2*   GFR: Estimated Creatinine Clearance: 137.4 mL/min (by C-G formula based on SCr of 0.8 mg/dL). Recent Labs  Lab 08/18/22 1650 08/18/22 1651 08/18/22 2134 08/19/22 0409  PROCALCITON  --   --  <0.10 <0.10  WBC  --  6.5  --  3.9*  LATICACIDVEN 1.7  --   --   --     Liver Function Tests: Recent Labs  Lab 08/18/22 1651 08/19/22 0409  AST 30 26  ALT 30 24  ALKPHOS 73 57  BILITOT 1.2 0.9  PROT 6.9 5.4*  ALBUMIN 4.0 3.1*   No results for input(s): "LIPASE", "AMYLASE" in the last 168 hours. No results for input(s): "AMMONIA" in the last 168 hours.  ABG No results found for: "PHART", "PCO2ART", "PO2ART", "HCO3", "TCO2", "ACIDBASEDEF", "O2SAT"   Coagulation Profile: Recent Labs  Lab 08/18/22 1728 08/19/22 0409  INR 2.4* 2.6*    Cardiac Enzymes: No results for input(s): "CKTOTAL", "CKMB", "CKMBINDEX", "TROPONINI" in the last 168 hours.  HbA1C: Hgb A1c MFr Bld  Date/Time Value Ref Range Status  08/08/2022 10:55 AM 5.8 4.6 -  6.5 % Final    Comment:    Glycemic Control Guidelines for People with Diabetes:Non Diabetic:  <6%Goal of Therapy: <7%Additional Action Suggested:  >8%   06/06/2021 10:21 AM 7.3 (H) 4.6 - 6.5 % Final    Comment:    Glycemic Control Guidelines for People with Diabetes:Non Diabetic:  <6%Goal of Therapy: <7%Additional Action Suggested:  >8%     CBG: Recent Labs  Lab 08/19/22 0742  GLUCAP 101*    Review of Systems:   Reviewed and negative  Past Medical History:  He,  has a past medical history of Arthritis, Diabetes mellitus without complication ([place]), DVT (deep venous thrombosis) ([place]), Dyspnea, Elevated lipids, Hyperlipidemia, Hypertension, and Lower extremity edema.   Surgical History:   Past Surgical History:  Procedure Laterality Date   COLONOSCOPY WITH  PROPOFOL N/A 09/23/2018   Per Dr. Alice Reichert, polyp, repeat in 3 yrs    EYE MUSCLE SURGERY     JOINT REPLACEMENT Left 07/22/2017   TOTAL HIP ARTHROPLASTY Left 07/22/2017   Procedure: TOTAL HIP ARTHROPLASTY;  Surgeon: Dereck Leep, MD;  Location: [facility] ORS;  Service: Orthopedics;  Laterality: Left;     Social History:   reports that he quit smoking about 41 years ago. His smoking use included cigarettes. He has a 3.00 pack-year smoking history. He has never used smokeless tobacco. He reports current alcohol use. He reports that he does not use drugs.   Family History:  His family history includes Breast cancer in his mother; Colon cancer in his maternal grandmother; Emphysema in his father.   Allergies No Known Allergies   Home Medications  Prior to Admission medications   Medication Sig Start Date End Date Taking? Authorizing Provider  albuterol (VENTOLIN HFA) 108 (90 Base) MCG/ACT inhaler Inhale 2 puffs into the lungs every 4 (four) hours as needed. Patient taking differently: Inhale 2 puffs into the lungs every 4 (four) hours as needed for wheezing or shortness of breath. 05/11/22  Yes Laurey Morale, MD  amLODipine  (NORVASC) 5 MG tablet Take 1 tablet (5 mg total) by mouth daily. 08/08/22  Yes Laurey Morale, MD  glipiZIDE (GLUCOTROL) 10 MG tablet Take 1 tablet (10 mg total) by mouth 2 (two) times daily before a meal. Patient taking differently: Take 10 mg by mouth daily before breakfast. 08/08/22  Yes Laurey Morale, MD  magnesium oxide (MAG-OX) 400 (240 Mg) MG tablet Take 400 mg by mouth in the morning.   Yes [provider]  metFORMIN (GLUCOPHAGE) 500 MG tablet TAKE TWO TABLETS BY MOUTH EVERY MORNING WITH BREAKFAST Patient taking differently: Take 1,000 mg by mouth daily with breakfast. 08/06/22  Yes Laurey Morale, MD  pravastatin (PRAVACHOL) 20 MG tablet Take 1 tablet (20 mg total) by mouth daily. 08/08/22  Yes Laurey Morale, MD  triamcinolone cream (KENALOG) 0.1 % Apply 1 Application topically 3 (three) times daily. Patient taking differently: Apply 1 Application topically 3 (three) times daily as needed (for bilateral, below-the-knees skin irritation). 07/03/22  Yes Laurey Morale, MD  warfarin (COUMADIN) 5 MG tablet TAKE 1 TABLET BY MOUTH DAILY EXCEPT TAKE 1 1/2 TABLETS ON SUNDAYS, WEDNESDAYS, AND FRIDAYS OR AS DIRECTED BY ANTICOAGULATION CLINIC Patient taking differently: Take 5-7.5 mg by mouth See admin instructions. Take 7.5 mg by mouth in the evening on Sunday and 5 mg on Mon/Tues/Wed/Thurs/Fri/Sat 07/25/22  Yes Laurey Morale, MD  benzonatate (TESSALON) 100 MG capsule Take 1 capsule (100 mg total) by mouth every 8 (eight) hours. Patient not taking: Reported on 08/18/2022 07/19/22   Laurene Footman B, PA-C  glucose blood test strip 1 each by Other route as needed for other. accu chek aviva plusUse as instructed    [provider]  ketoconazole (NIZORAL) 2 % cream APPLY TO AFFECTED AREA TWICE DAILY AS NEEDED FOR IRRITATION Patient not taking: Reported on 08/18/2022 09/18/21   Laurey Morale, MD  promethazine-dextromethorphan (PROMETHAZINE-DM) 6.25-15 MG/5ML syrup Take 5 mLs by mouth 4  (four) times daily as needed. Patient not taking: Reported on 08/18/2022 07/19/22   Laurene Footman B, PA-C  tadalafil (CIALIS) 20 MG tablet Take 0.5-1 tablets (10-20 mg total) by mouth every other day as needed for erectile dysfunction. Patient not taking: Reported on 08/18/2022 08/08/22   Laurey Morale, MD     Signature:  Chesley Mires, MD [place] Pager - [phone], or [phone] 08/19/2022, 10:27 AM

## 2022-08-19 NOTE — Progress Notes (Addendum)
ANTICOAGULATION CONSULT NOTE   Pharmacy Consult for warfarin Indication: hx recurrent DVT  No Known Allergies  Patient Measurements: Height: '6\' 3"'$  (190.5 cm) Weight: 130.2 kg (287 lb) IBW/kg (Calculated) : 84.5 Heparin Dosing Weight:   Vital Signs: Temp: 98 F (36.7 C) (10/22 0747) Temp Source: Oral (10/22 0747) BP: 157/66 (10/22 0747) Pulse Rate: 68 (10/22 0747)  Labs: Recent Labs    08/18/22 1651 08/18/22 1728 08/18/22 1732 08/18/22 2134 08/19/22 0409  HGB 18.0*  --   --   --  15.9  HCT 54.7*  --   --   --  47.8  PLT 215  --   --   --  179  LABPROT  --  25.8*  --   --  27.5*  INR  --  2.4*  --   --  2.6*  CREATININE 0.95  --   --   --  0.80  TROPONINIHS  --   --  8 9  --      Estimated Creatinine Clearance: 137.4 mL/min (by C-G formula based on SCr of 0.8 mg/dL).   Medications:  PTA warfarin regimen: 5 mg daily except 7.5 mg on Sunday (last dose taken on 08/18/22 at 2p)  Assessment: Patient is a 61 y.o M with hx lupus and recurrent DVT on warfarin PTA who presented to the ED on 08/18/22 further workup after chest CT done outpatient for evaluation of cough/fatigue showed findings consistent with multifocal pneumonia as well as multiple new hepatic lesions  with enlarging lymph nodes  that's concerning for metastatic disease.  Warfarin resumed on admission.  Today, 08/19/2022: - INR  is therapeutic at 2.6 - cbc ok - no bleeding documented - drug-drug intxns: being on abx (ceftriaxone/azithro) can make pt more sensitive to warfarin  Goal of Therapy:  INR 2-3 Monitor platelets by anticoagulation protocol: Yes   Plan:  - warfarin 7.5 mg PO x1 per home dose - daily INR - monitor for s/sx bleeding  Kyla Duffy P 08/19/2022,10:42 AM __________________________________  Adden: Per Dr. Tana Coast, hold warfarin for now in case liver biopsy is needed for pt - pharmacy will sign off for warfarin. Please re-enter pharmacy consult if/when you want [place] to resume it back or  for [place] to help dose heparin drip once INR <2 while off warfarin.  Dia Sitter, PharmD, BCPS 08/19/2022 11:55 AM   Dia Sitter, PharmD, BCPS 08/19/2022 11:52 AM

## 2022-08-20 ENCOUNTER — Inpatient Hospital Stay (HOSPITAL_COMMUNITY): Payer: BC Managed Care – PPO

## 2022-08-20 ENCOUNTER — Telehealth: Payer: Self-pay

## 2022-08-20 DIAGNOSIS — C228 Malignant neoplasm of liver, primary, unspecified as to type: Secondary | ICD-10-CM | POA: Diagnosis present

## 2022-08-20 DIAGNOSIS — D751 Secondary polycythemia: Secondary | ICD-10-CM | POA: Diagnosis present

## 2022-08-20 DIAGNOSIS — Z8701 Personal history of pneumonia (recurrent): Secondary | ICD-10-CM | POA: Diagnosis not present

## 2022-08-20 DIAGNOSIS — I890 Lymphedema, not elsewhere classified: Secondary | ICD-10-CM | POA: Diagnosis present

## 2022-08-20 DIAGNOSIS — E119 Type 2 diabetes mellitus without complications: Secondary | ICD-10-CM | POA: Diagnosis present

## 2022-08-20 DIAGNOSIS — I82411 Acute embolism and thrombosis of right femoral vein: Secondary | ICD-10-CM | POA: Diagnosis not present

## 2022-08-20 DIAGNOSIS — E785 Hyperlipidemia, unspecified: Secondary | ICD-10-CM | POA: Diagnosis present

## 2022-08-20 DIAGNOSIS — Z96642 Presence of left artificial hip joint: Secondary | ICD-10-CM | POA: Diagnosis present

## 2022-08-20 DIAGNOSIS — I10 Essential (primary) hypertension: Secondary | ICD-10-CM | POA: Diagnosis present

## 2022-08-20 DIAGNOSIS — Z87891 Personal history of nicotine dependence: Secondary | ICD-10-CM | POA: Diagnosis not present

## 2022-08-20 DIAGNOSIS — D6861 Antiphospholipid syndrome: Secondary | ICD-10-CM | POA: Diagnosis present

## 2022-08-20 DIAGNOSIS — Z6835 Body mass index (BMI) 35.0-35.9, adult: Secondary | ICD-10-CM | POA: Diagnosis not present

## 2022-08-20 DIAGNOSIS — Z8616 Personal history of COVID-19: Secondary | ICD-10-CM | POA: Diagnosis not present

## 2022-08-20 DIAGNOSIS — N281 Cyst of kidney, acquired: Secondary | ICD-10-CM | POA: Diagnosis present

## 2022-08-20 DIAGNOSIS — Z7984 Long term (current) use of oral hypoglycemic drugs: Secondary | ICD-10-CM | POA: Diagnosis not present

## 2022-08-20 DIAGNOSIS — G4733 Obstructive sleep apnea (adult) (pediatric): Secondary | ICD-10-CM | POA: Diagnosis present

## 2022-08-20 DIAGNOSIS — D6862 Lupus anticoagulant syndrome: Secondary | ICD-10-CM | POA: Diagnosis present

## 2022-08-20 DIAGNOSIS — J189 Pneumonia, unspecified organism: Secondary | ICD-10-CM | POA: Diagnosis present

## 2022-08-20 DIAGNOSIS — Z86718 Personal history of other venous thrombosis and embolism: Secondary | ICD-10-CM | POA: Diagnosis not present

## 2022-08-20 DIAGNOSIS — J439 Emphysema, unspecified: Secondary | ICD-10-CM | POA: Diagnosis present

## 2022-08-20 DIAGNOSIS — Z79899 Other long term (current) drug therapy: Secondary | ICD-10-CM | POA: Diagnosis not present

## 2022-08-20 DIAGNOSIS — Z7901 Long term (current) use of anticoagulants: Secondary | ICD-10-CM | POA: Diagnosis not present

## 2022-08-20 DIAGNOSIS — B1920 Unspecified viral hepatitis C without hepatic coma: Secondary | ICD-10-CM | POA: Diagnosis present

## 2022-08-20 DIAGNOSIS — Z1152 Encounter for screening for COVID-19: Secondary | ICD-10-CM | POA: Diagnosis not present

## 2022-08-20 LAB — COMPREHENSIVE METABOLIC PANEL
ALT: 23 U/L (ref 0–44)
AST: 25 U/L (ref 15–41)
Albumin: 3.3 g/dL — ABNORMAL LOW (ref 3.5–5.0)
Alkaline Phosphatase: 63 U/L (ref 38–126)
Anion gap: 11 (ref 5–15)
BUN: 8 mg/dL (ref 8–23)
CO2: 22 mmol/L (ref 22–32)
Calcium: 8.2 mg/dL — ABNORMAL LOW (ref 8.9–10.3)
Chloride: 104 mmol/L (ref 98–111)
Creatinine, Ser: 0.65 mg/dL (ref 0.61–1.24)
GFR, Estimated: >60 mL/min (ref >60)
Glucose, Bld: 107 mg/dL — ABNORMAL HIGH (ref 70–99)
Potassium: 3.6 mmol/L (ref 3.5–5.1)
Sodium: 137 mmol/L (ref 135–145)
Total Bilirubin: 0.9 mg/dL (ref 0.3–1.2)
Total Protein: 5.9 g/dL — ABNORMAL LOW (ref 6.5–8.1)

## 2022-08-20 LAB — HEPARIN LEVEL (UNFRACTIONATED): Heparin Unfractionated: 0.25 IU/mL — ABNORMAL LOW (ref 0.30–0.70)

## 2022-08-20 LAB — CBC WITH DIFFERENTIAL/PLATELET
Abs Immature Granulocytes: 0.01 10*3/uL (ref 0.00–0.07)
Basophils Absolute: 0 10*3/uL (ref 0.0–0.1)
Basophils Relative: 1 %
Eosinophils Absolute: 0.1 10*3/uL (ref 0.0–0.5)
Eosinophils Relative: 1 %
HCT: 51.4 % (ref 39.0–52.0)
Hemoglobin: 16.6 g/dL (ref 13.0–17.0)
Immature Granulocytes: 0 %
Lymphocytes Relative: 12 %
Lymphs Abs: 0.5 10*3/uL — ABNORMAL LOW (ref 0.7–4.0)
MCH: 31.7 pg (ref 26.0–34.0)
MCHC: 32.3 g/dL (ref 30.0–36.0)
MCV: 98.3 fL (ref 80.0–100.0)
Monocytes Absolute: 0.7 10*3/uL (ref 0.1–1.0)
Monocytes Relative: 16 %
Neutro Abs: 2.9 10*3/uL (ref 1.7–7.7)
Neutrophils Relative %: 70 %
Platelets: 149 10*3/uL — ABNORMAL LOW (ref 150–400)
RBC: 5.23 MIL/uL (ref 4.22–5.81)
RDW: 13.1 % (ref 11.5–15.5)
WBC: 4.1 10*3/uL (ref 4.0–10.5)
nRBC: 0 % (ref 0.0–0.2)

## 2022-08-20 LAB — PROTIME-INR
INR: 2.2 — ABNORMAL HIGH (ref 0.8–1.2)
INR: 1.7 — ABNORMAL HIGH (ref 0.8–1.2)
Prothrombin Time: 23.9 seconds — ABNORMAL HIGH (ref 11.4–15.2)
Prothrombin Time: 19.9 seconds — ABNORMAL HIGH (ref 11.4–15.2)

## 2022-08-20 LAB — PROCALCITONIN: Procalcitonin: <0.1 ng/mL

## 2022-08-20 LAB — HEPATITIS B CORE ANTIBODY, TOTAL: Hep B Core Total Ab: NONREACTIVE

## 2022-08-20 LAB — ABO/RH: ABO/RH(D): B POS

## 2022-08-20 LAB — GLUCOSE, CAPILLARY
Glucose-Capillary: 136 mg/dL — ABNORMAL HIGH (ref 70–99)
Glucose-Capillary: 128 mg/dL — ABNORMAL HIGH (ref 70–99)
Glucose-Capillary: 179 mg/dL — ABNORMAL HIGH (ref 70–99)
Glucose-Capillary: 132 mg/dL — ABNORMAL HIGH (ref 70–99)

## 2022-08-20 LAB — HEPATITIS C ANTIBODY: HCV Ab: REACTIVE — AB

## 2022-08-20 MED ORDER — FENTANYL CITRATE (PF) 100 MCG/2ML IJ SOLN
INTRAMUSCULAR | Status: AC | PRN
  Administered 2022-08-20: 50 ug via INTRAVENOUS

## 2022-08-20 MED ORDER — MIDAZOLAM HCL 2 MG/2ML IJ SOLN
INTRAMUSCULAR | Status: AC
  Filled 2022-08-20: qty 2

## 2022-08-20 MED ORDER — SODIUM CHLORIDE 0.9% IV SOLUTION: Freq: Once | INTRAVENOUS | Status: DC

## 2022-08-20 MED ORDER — MIDAZOLAM HCL 2 MG/2ML IJ SOLN
INTRAMUSCULAR | Status: AC | PRN
  Administered 2022-08-20: 1 mg via INTRAVENOUS

## 2022-08-20 MED ORDER — PHYTONADIONE 5 MG PO TABS
5.0000 mg | ORAL_TABLET | Freq: Once | ORAL | Status: AC
  Administered 2022-08-20: 5 mg via ORAL
  Filled 2022-08-20: qty 1

## 2022-08-20 MED ORDER — WARFARIN SODIUM 5 MG PO TABS
7.5000 mg | ORAL_TABLET | Freq: Once | ORAL | Status: AC
  Administered 2022-08-20: 7.5 mg via ORAL
  Filled 2022-08-20: qty 1

## 2022-08-20 MED ORDER — GELATIN ABSORBABLE 12-7 MM EX MISC
CUTANEOUS | Status: AC
  Filled 2022-08-20: qty 1

## 2022-08-20 MED ORDER — LIDOCAINE HCL 1 % IJ SOLN
INTRAMUSCULAR | Status: AC
  Filled 2022-08-20: qty 20

## 2022-08-20 MED ORDER — GELATIN ABSORBABLE 12-7 MM EX MISC
CUTANEOUS | Status: AC | PRN
  Administered 2022-08-20: 1 via TOPICAL

## 2022-08-20 MED ORDER — LIDOCAINE HCL 1 % IJ SOLN
INTRAMUSCULAR | Status: AC | PRN
  Administered 2022-08-20: 10 mL via INTRADERMAL

## 2022-08-20 MED ORDER — FENTANYL CITRATE (PF) 100 MCG/2ML IJ SOLN
INTRAMUSCULAR | Status: AC
  Filled 2022-08-20: qty 2

## 2022-08-20 MED ORDER — HEPARIN (PORCINE) 25000 UT/250ML-% IV SOLN
2050.0000 [IU]/h | INTRAVENOUS | Status: DC
  Administered 2022-08-20: 1900 [IU]/h via INTRAVENOUS
  Administered 2022-08-21: 2050 [IU]/h via INTRAVENOUS
  Filled 2022-08-20 (×2): qty 250

## 2022-08-20 MED ORDER — WARFARIN - PHARMACIST DOSING INPATIENT: Freq: Every day | Status: DC

## 2022-08-20 NOTE — Progress Notes (Signed)
Patient ID: Christopher Marsh, male   DOB: 06/17/1961, 61 y.o.   MRN: [id number] PT/INR post vit K today is 19.9/1.7; findings d/w Dr. Maryelizabeth Kaufmann; will plan to proceed with liver mass bx today; nurse aware

## 2022-08-20 NOTE — Progress Notes (Signed)
Patient refused cpap tonight.

## 2022-08-20 NOTE — Telephone Encounter (Signed)
I informed the patient of these results yesterday and advised him to go to the ED. He did, and he was admitted to the hospital as I expected

## 2022-08-20 NOTE — Progress Notes (Signed)
[place] for Warfarin with IV heparin bridge Indication: hx of recurrent DVT, positive lupus anticoagulant  No Known Allergies  Patient Measurements: Height: '6\' 3"'$  (190.5 cm) Weight: 130.2 kg (287 lb) IBW/kg (Calculated) : 84.5 Heparin Dosing Weight: 113 kg  Vital Signs: Temp: 98.5 F (36.9 C) (10/23 2142) Temp Source: Oral (10/23 2142) BP: 140/63 (10/23 2300) Pulse Rate: 68 (10/23 2300)  Labs: Recent Labs    08/18/22 1651 08/18/22 1728 08/18/22 1732 08/18/22 2134 08/19/22 0409 08/20/22 0352 08/20/22 1116 08/20/22 2137  HGB 18.0*  --   --   --  15.9 16.6  --   --   HCT 54.7*  --   --   --  47.8 51.4  --   --   PLT 215  --   --   --  179 149*  --   --   LABPROT  --    < >  --   --  27.5* 23.9* 19.9*  --   INR  --    < >  --   --  2.6* 2.2* 1.7*  --   HEPARINUNFRC  --   --   --   --   --   --   --  0.25*  CREATININE 0.95  --   --   --  0.80 0.65  --   --   TROPONINIHS  --   --  8 9  --   --   --   --    < > = values in this interval not displayed.     Estimated Creatinine Clearance: 137.4 mL/min (by C-G formula based on SCr of 0.65 mg/dL).   Medical History: Past Medical History:  Diagnosis Date   Antiphospholipid antibody syndrome ([place])    Arthritis    Diabetes mellitus without complication ([place])    DVT (deep venous thrombosis) ([place])    Lt leg   Dyspnea    Elevated lipids    Erythrocytosis    Hyperlipidemia    Hypertension    Lower extremity edema    Multinodular thyroid    Post-thrombotic syndrome     Medications:  PTA Warfarin 7.'5mg'$  on Sundays and '5mg'$  on all other days of the week - last dose 08/18/22 at 1400  Assessment: 61 y/oM on chronic warfarin therapy PTA for history of recurrent DVT, positive lupus anticoagulant who presented to the ED on 08/18/22 with cough/fatigue after chest CT done outpatient showed findings consistent with multifocal pneumonia as well as multiple new hepatic lesions with enlarging  lymph nodes concerning for metastatic disease. CT abd pelvis showed 6.5x 5.2 cm mass in the right hepatic lobe, highly concerning for neoplasm or malignancy.  Warfarin resumed on admission. INR therapeutic at 2.4 on admission.  Significant Events: 08/19/22: Warfarin then held for liver biopsy needed.  08/20/22: Vitamin K '5mg'$  PO x 1 at 0856 and 2 units FFP Repeat INR today 1.7. Liver biopsy performed per IR. Heparin/warfarin resumed at 8p  08/20/2022: Initial heparin level 0.25- subtherapeutic on IV heparin 1900 units/hr No bleeding or infusion related concerns per RN CBC: Hgb WNL, Plt slightly low at 149K Drug-drug interactions: being on antibiotics (ceftriaxone/azithromycin) can make pt more sensitive to warfarin  Goal of Therapy:  INR 2-3 Heparin level 0.3-0.7 units/mL Monitor platelets by anticoagulation protocol: Yes   Plan:  Increase heparin infusion to 2050 units/hr - continue until INR therapeutic  Recheck heparin level in 8h Daily PT/INR, heparin level, CBC Monitor closely  for s/sx of bleeding  Netta Cedars, PharmD, BCPS 08/20/2022,11:23 PM

## 2022-08-20 NOTE — Procedures (Signed)
Vascular and Interventional Radiology Procedure Note  Patient: Christopher Marsh DOB: October 07, 1961 Medical Record Number: [id number] Note Date/Time: 08/20/22 1:24 PM   Performing Physician: Michaelle Birks, MD Assistant(s): None  Diagnosis: Liver Mass  Procedure: LIVER MASS BIOPSY  Anesthesia: Conscious Sedation Complications: None Estimated Blood Loss: Minimal Specimens: Sent for Pathology  Findings:  Successful Ultrasound-guided biopsy of liver mass. A total of 3 samples were obtained. Hemostasis of the tract was achieved using Gelfoam Slurry Embolization.  Plan: Bed rest for 2 hours.  See detailed procedure note with images in PACS. The patient tolerated the procedure well without incident or complication and was returned to Floor Bed in stable condition.    Michaelle Birks, MD [facility] Radiology   Pager. [place]

## 2022-08-20 NOTE — Progress Notes (Addendum)
[place] for Warfarin with IV heparin bridge Indication: hx of recurrent DVT, positive lupus anticoagulant  No Known Allergies  Patient Measurements: Height: '6\' 3"'$  (190.5 cm) Weight: 130.2 kg (287 lb) IBW/kg (Calculated) : 84.5 Heparin Dosing Weight: 113 kg  Vital Signs: Temp: 98.7 F (37.1 C) (10/23 0433) Temp Source: Oral (10/23 0433) BP: 146/64 (10/23 1340) Pulse Rate: 70 (10/23 1340)  Labs: Recent Labs    08/18/22 1651 08/18/22 1728 08/18/22 1732 08/18/22 2134 08/19/22 0409 08/20/22 0352 08/20/22 1116  HGB 18.0*  --   --   --  15.9 16.6  --   HCT 54.7*  --   --   --  47.8 51.4  --   PLT 215  --   --   --  179 149*  --   LABPROT  --    < >  --   --  27.5* 23.9* 19.9*  INR  --    < >  --   --  2.6* 2.2* 1.7*  CREATININE 0.95  --   --   --  0.80 0.65  --   TROPONINIHS  --   --  8 9  --   --   --    < > = values in this interval not displayed.    Estimated Creatinine Clearance: 137.4 mL/min (by C-G formula based on SCr of 0.65 mg/dL).   Medical History: Past Medical History:  Diagnosis Date   Antiphospholipid antibody syndrome ([place])    Arthritis    Diabetes mellitus without complication ([place])    DVT (deep venous thrombosis) ([place])    Lt leg   Dyspnea    Elevated lipids    Erythrocytosis    Hyperlipidemia    Hypertension    Lower extremity edema    Multinodular thyroid    Post-thrombotic syndrome     Medications:  PTA Warfarin 7.'5mg'$  on Sundays and '5mg'$  on all other days of the week - last dose 08/18/22 at 1400  Assessment: 61 y/oM on chronic warfarin therapy PTA for history of recurrent DVT, positive lupus anticoagulant who presented to the ED on 08/18/22 with cough/fatigue after chest CT done outpatient showed findings consistent with multifocal pneumonia as well as multiple new hepatic lesions with enlarging lymph nodes concerning for metastatic disease. CT abd pelvis showed 6.5x 5.2 cm mass in the right hepatic  lobe, highly concerning for neoplasm or malignancy.  Warfarin resumed on admission. INR therapeutic at 2.4 on admission. Warfarin then held 08/19/22 in case liver biopsy needed. Patient received Vitamin K '5mg'$  PO x 1 today at 0856 and 2 units FFP per MD. Repeat INR today 1.7. Liver biopsy performed today per IR.   Post-procedure, received consults to resume warfarin at 8pm and start IV heparin bridge now without bolus.    CBC: Hgb WNL, Plt slightly low at 149K  Drug-drug interactions: being on antibiotics (ceftriaxone/azithromycin) can make pt more sensitive to warfarin  Goal of Therapy:  INR 2-3 Heparin level 0.3-0.7 units/mL Monitor platelets by anticoagulation protocol: Yes   Plan:  Start heparin infusion at 1900 units/hr now - continue until INR therapeutic  Heparin level 6 hours after initiation Warfarin 7.'5mg'$  PO x 1 today at 8pm Daily PT/INR, CBC, heparin level Monitor closely for s/sx of bleeding   Lindell Spar, PharmD, BCPS Clinical Pharmacist  08/20/2022,2:51 PM

## 2022-08-20 NOTE — Consult Note (Signed)
Referring Provider: Mendel Corning, MD Primary Care Physician:  Laurey Morale, MD Primary Gastroenterologist:  Althia Forts  Reason for Consultation:  Liver mass  HPI: Christopher Marsh is a 62 y.o. male with HLD, obesity, venous insufficiency, lymphedema, esophagitis, hepatitis C, diabetes mellitus, recurrent DVTs on chronic anticoagulation, with cough and shortness of breath, ongoing for 4-5 months. He was diagnosed with LLL pneumonia in September and prescribed Augmentin and Zithromax.  Had improvement but continued to have chronic cough.  Outpatient CT of the chest was ordered which showed multifocal pneumonia and liver masses.  Patient presented to the ED for further evaluation.  CT A/P 08/18/2022 showed new mass in the right hepatic lobe concerning for malignancy.  Periaortic lymphadenopathy noted.  Stable basilar opacities representing infection/inflammation versus atelectasis versus scarring.  Pulmonology consulted and patient admitted for further work-up.  GI consulted due to finding of liver mass.  IR has been consulted liver mass biopsy. He is on Coumadin at home which needs to be held for 5 days prior to biopsy.  Last dose 08/18/2022.   Patient seen and examined at bedside, wife in the room.  States he has been diagnosed with pneumonia this time of year for 3 years in a row.  Most recently was treated with Z-Pak and amoxicillin.  Presented to the ED after having a CT which showed multifocal pneumonia.  Liver mass was an incidental finding.  Patient states his brother had hepatitis C as well as cirrhosis.  Patient was treated with interferon for 3 to 4 months and following treatment was told that he did not have hepatitis C.  He does not believe he was ever diagnosed with hepatitis C and that his treatment was precautionary.  He has history of recurrent DVTs, most recently 8 months ago in his right lower extremity.  He is on chronic warfarin, last dose prior to coming to the hospital  08/18/2022.  Denies history of MI/stroke.  Quit smoking age 34.  He has history of heavy alcohol use.  Currently drinks 4 to 5 days out of the week, amount of alcohol varies between 2-3 drinks of liquor in a day.  Reports history of heavy drinking.  Used to drink 12-14 beers in a day.  Last colonoscopy November 2019 revealed internal hemorrhoids and 1 tubular adenoma.  Done at [facility].  Patient states he was due for colonoscopy this year which was canceled due to DVT.  Plans to follow-up with GI outpatient.  Patient denies fever, chills, nausea, vomiting.  Currently denies chest pain or shortness of breath.  Denies abdominal pain.  Has a normal bowel movement daily and denies melena or hematochezia.  Denies confusion, jaundice, worsening of lower extremity edema or abdominal distention.  Past Medical History:  Diagnosis Date   Antiphospholipid antibody syndrome (HCC)    Arthritis    Diabetes mellitus without complication (HCC)    DVT (deep venous thrombosis) (HCC)    Lt leg   Dyspnea    Elevated lipids    Erythrocytosis    Hyperlipidemia    Hypertension    Lower extremity edema    Multinodular thyroid    Post-thrombotic syndrome     Past Surgical History:  Procedure Laterality Date   COLONOSCOPY WITH PROPOFOL N/A 09/23/2018   Per Dr. Alice Reichert, polyp, repeat in 3 yrs    EYE MUSCLE SURGERY     JOINT REPLACEMENT Left 07/22/2017   TOTAL HIP ARTHROPLASTY Left 07/22/2017   Procedure: TOTAL HIP ARTHROPLASTY;  Surgeon: Dereck Leep, MD;  Location: [facility] ORS;  Service: Orthopedics;  Laterality: Left;    Prior to Admission medications   Medication Sig Start Date End Date Taking? Authorizing Provider  albuterol (VENTOLIN HFA) 108 (90 Base) MCG/ACT inhaler Inhale 2 puffs into the lungs every 4 (four) hours as needed. Patient taking differently: Inhale 2 puffs into the lungs every 4 (four) hours as needed for wheezing or shortness of breath. 05/11/22  Yes Laurey Morale, MD  amLODipine (NORVASC) 5 MG tablet Take 1 tablet (5 mg total) by mouth daily. 08/08/22  Yes Laurey Morale, MD  glipiZIDE (GLUCOTROL) 10 MG tablet Take 1 tablet (10 mg total) by mouth 2 (two) times daily before a meal. Patient taking differently: Take 10 mg by mouth daily before breakfast. 08/08/22  Yes Laurey Morale, MD  magnesium oxide (MAG-OX) 400 (240 Mg) MG tablet Take 400 mg by mouth in the morning.   Yes [provider]  metFORMIN (GLUCOPHAGE) 500 MG tablet TAKE TWO TABLETS BY MOUTH EVERY MORNING WITH BREAKFAST Patient taking differently: Take 1,000 mg by mouth daily with breakfast. 08/06/22  Yes Laurey Morale, MD  pravastatin (PRAVACHOL) 20 MG tablet Take 1 tablet (20 mg total) by mouth daily. 08/08/22  Yes Laurey Morale, MD  triamcinolone cream (KENALOG) 0.1 % Apply 1 Application topically 3 (three) times daily. Patient taking differently: Apply 1 Application topically 3 (three) times daily as needed (for bilateral, below-the-knees skin irritation). 07/03/22  Yes Laurey Morale, MD  warfarin (COUMADIN) 5 MG tablet TAKE 1 TABLET BY MOUTH DAILY EXCEPT TAKE 1 1/2 TABLETS ON SUNDAYS, WEDNESDAYS, AND FRIDAYS OR AS DIRECTED BY ANTICOAGULATION CLINIC Patient taking differently: Take 5-7.5 mg by mouth See admin instructions. Take 7.5 mg by mouth in the evening on Sunday and 5 mg on Mon/Tues/Wed/Thurs/Fri/Sat 07/25/22  Yes Laurey Morale, MD  benzonatate (TESSALON) 100 MG capsule Take 1 capsule (100 mg total) by mouth every 8 (eight) hours. Patient not taking: Reported on 08/18/2022 07/19/22   Laurene Footman B, PA-C  glucose blood test strip 1 each by Other route as needed for other. accu chek aviva plusUse as instructed    [provider]  ketoconazole (NIZORAL) 2 % cream APPLY TO AFFECTED AREA TWICE DAILY AS NEEDED FOR IRRITATION Patient not taking: Reported on 08/18/2022 09/18/21   Laurey Morale, MD  promethazine-dextromethorphan (PROMETHAZINE-DM) 6.25-15 MG/5ML syrup  Take 5 mLs by mouth 4 (four) times daily as needed. Patient not taking: Reported on 08/18/2022 07/19/22   Laurene Footman B, PA-C  tadalafil (CIALIS) 20 MG tablet Take 0.5-1 tablets (10-20 mg total) by mouth every other day as needed for erectile dysfunction. Patient not taking: Reported on 08/18/2022 08/08/22   Laurey Morale, MD    Scheduled Meds:  sodium chloride   Intravenous Once   amLODipine  5 mg Oral Daily   insulin aspart  0-15 Units Subcutaneous TID WC   pravastatin  20 mg Oral Daily   sodium chloride flush  3 mL Intravenous Q12H   Continuous Infusions:  azithromycin Stopped (08/19/22 2258)   cefTRIAXone (ROCEPHIN)  IV Stopped (08/19/22 2152)   PRN Meds:.acetaminophen **OR** acetaminophen, albuterol, polyethylene glycol, triamcinolone cream  Allergies as of 08/18/2022   (No Known Allergies)    Family History  Problem Relation Age of Onset   Breast cancer Mother    Emphysema Father    Colon cancer Maternal Grandmother     Social History   Socioeconomic History  Marital status: Married    Spouse name: Not on file   Number of children: Not on file   Years of education: Not on file   Highest education level: Not on file  Occupational History   Not on file  Tobacco Use   Smoking status: Former    Packs/day: 0.50    Years: 6.00    Total pack years: 3.00    Types: Cigarettes    Quit date: 6    Years since quitting: 41.8   Smokeless tobacco: Never  Vaping Use   Vaping Use: Never used  Substance and Sexual Activity   Alcohol use: Yes    Comment: 2-3 nights a week   Drug use: No   Sexual activity: Not on file  Other Topics Concern   Not on file  Social History Narrative   Not on file   Social Determinants of Health   Financial Resource Strain: Not on file  Food Insecurity: No Food Insecurity (08/18/2022)   Hunger Vital Sign    Worried About Running Out of Food in the Last Year: Never true    Ran Out of Food in the Last Year: Never true   Transportation Needs: No Transportation Needs (08/18/2022)   PRAPARE - Hydrologist (Medical): No    Lack of Transportation (Non-Medical): No  Physical Activity: Not on file  Stress: Not on file  Social Connections: Not on file  Intimate Partner Violence: Not At Risk (08/18/2022)   Humiliation, Afraid, Rape, and Kick questionnaire    Fear of Current or Ex-Partner: No    Emotionally Abused: No    Physically Abused: No    Sexually Abused: No    Review of Systems: Review of Systems  Constitutional:  Negative for chills and fever.  HENT:  Negative for sore throat.   Eyes:  Negative for discharge and redness.  Respiratory:  Negative for shortness of breath, wheezing and stridor.   Cardiovascular:  Positive for leg swelling. Negative for chest pain and palpitations.  Gastrointestinal:  Negative for abdominal pain, blood in stool, constipation, diarrhea, heartburn, melena, nausea and vomiting.  Genitourinary:  Negative for dysuria and urgency.  Musculoskeletal:  Negative for falls and joint pain.  Skin:  Negative for itching and rash.  Neurological:  Negative for seizures and loss of consciousness.  Endo/Heme/Allergies:  Negative for environmental allergies and polydipsia.  Psychiatric/Behavioral:  Negative for memory loss. The patient does not have insomnia.      Physical Exam: Physical Exam Vitals reviewed.  Constitutional:      General: He is not in acute distress.    Appearance: He is not toxic-appearing.  HENT:     Head: Normocephalic and atraumatic.     Right Ear: External ear normal.     Left Ear: External ear normal.     Nose: Nose normal.     Mouth/Throat:     Mouth: Mucous membranes are Marsh.     Pharynx: Oropharynx is clear.  Eyes:     General: No scleral icterus.    Extraocular Movements: Extraocular movements intact.     Conjunctiva/sclera: Conjunctivae normal.  Cardiovascular:     Rate and Rhythm: Normal rate and regular rhythm.      Pulses: Normal pulses.     Heart sounds: Normal heart sounds.  Pulmonary:     Effort: Pulmonary effort is normal.     Breath sounds: Normal breath sounds.  Abdominal:     General: Bowel sounds are normal.  There is no distension.     Palpations: Abdomen is soft.     Tenderness: There is no abdominal tenderness. There is no guarding or rebound.  Musculoskeletal:     Cervical back: Normal range of motion and neck supple.     Right lower leg: Edema present.     Left lower leg: Edema present.  Skin:    General: Skin is warm and dry.     Coloration: Skin is not jaundiced.  Neurological:     General: No focal deficit present.     Mental Status: He is alert and oriented to person, place, and time.  Psychiatric:        Mood and Affect: Mood normal.        Behavior: Behavior normal.     Vital signs: Vitals:   08/20/22 0433 08/20/22 0451  BP: (!) 166/83 (!) 152/75  Pulse: 73 65  Resp: 16   Temp: 98.7 F (37.1 C)   SpO2: 93%    Last BM Date : 08/18/22    GI:  Lab Results: Recent Labs    08/18/22 1651 08/19/22 0409 08/20/22 0352  WBC 6.5 3.9* 4.1  HGB 18.0* 15.9 16.6  HCT 54.7* 47.8 51.4  PLT 215 179 149*   BMET Recent Labs    08/18/22 1651 08/19/22 0409 08/20/22 0352  NA 138 139 137  K 4.5 4.1 3.6  CL 104 106 104  CO2 '27 26 22  '$ GLUCOSE 109* 122* 107*  BUN '10 8 8  '$ CREATININE 0.95 0.80 0.65  CALCIUM 8.9 8.2* 8.2*   LFT Recent Labs    08/20/22 0352  PROT 5.9*  ALBUMIN 3.3*  AST 25  ALT 23  ALKPHOS 63  BILITOT 0.9   PT/INR Recent Labs    08/19/22 0409 08/20/22 0352  LABPROT 27.5* 23.9*  INR 2.6* 2.2*     Studies/Results: ECHOCARDIOGRAM COMPLETE  Result Date: 08/19/2022    ECHOCARDIOGRAM REPORT   Patient Name:   Christopher Marsh Date of Exam: 08/19/2022 Medical Rec #:  [id number]        Height:       75.0 in Accession #:    [id number]       Weight:       287.0 lb Date of Birth:  08/29/1961         BSA:          2.558 m Patient Age:    81 years          BP:           164/75 mmHg Patient Gender: M                HR:           71 bpm. Exam Location:  Inpatient Procedure: 2D Echo and Intracardiac Opacification Agent Indications:    CHF  History:        Patient has no prior history of Echocardiogram examinations.                 Risk Factors:Hypertension, Diabetes and Dyslipidemia.  Sonographer:    Harvie Junior Referring Phys: [phone] RIPUDEEP K RAI  Sonographer Comments: Technically difficult study due to poor echo windows and patient is obese. Image acquisition challenging due to patient body habitus and Image acquisition challenging due to respiratory motion. IMPRESSIONS  1. Left ventricular ejection fraction, by estimation, is 60 to 65%. The left ventricle has normal function. The left ventricle has no regional wall motion abnormalities. Left  ventricular diastolic parameters were normal.  2. Right ventricular systolic function is normal. The right ventricular size is mildly enlarged. Tricuspid regurgitation signal is inadequate for assessing PA pressure.  3. No valvular stenoses or regurgitation noted. FINDINGS  Left Ventricle: Left ventricular ejection fraction, by estimation, is 60 to 65%. The left ventricle has normal function. The left ventricle has no regional wall motion abnormalities. The left ventricular internal cavity size was normal in size. There is  no left ventricular hypertrophy. Left ventricular diastolic parameters were normal. Right Ventricle: The right ventricular size is mildly enlarged. Right vetricular wall thickness was not well visualized. Right ventricular systolic function is normal. Tricuspid regurgitation signal is inadequate for assessing PA pressure. The tricuspid regurgitant velocity is 1.36 m/s, and with an assumed right atrial pressure of 3 mmHg, the estimated right ventricular systolic pressure is 56.4 mmHg. Left Atrium: Left atrial size was normal in size. Right Atrium: Right atrial size was normal in size. Pericardium: There  is no evidence of pericardial effusion. Mitral Valve: The mitral valve is grossly normal. No evidence of mitral valve regurgitation. No evidence of mitral valve stenosis. Tricuspid Valve: The tricuspid valve is not well visualized. Tricuspid valve regurgitation is not demonstrated. No evidence of tricuspid stenosis. Aortic Valve: The aortic valve was not well visualized. Aortic valve regurgitation is not visualized. No aortic stenosis is present. Aortic valve mean gradient measures 7.5 mmHg. Aortic valve peak gradient measures 13.4 mmHg. Aortic valve area, by VTI measures 2.97 cm. Pulmonic Valve: The pulmonic valve was not well visualized. Pulmonic valve regurgitation is not visualized. No evidence of pulmonic stenosis. Aorta: The aortic root is normal in size and structure. Venous: The inferior vena cava was not well visualized. IAS/Shunts: No atrial level shunt detected by color flow Doppler.  LEFT VENTRICLE PLAX 2D LVIDd:         4.20 cm      Diastology LVIDs:         2.90 cm      LV e' medial:    7.40 cm/s LV PW:         1.10 cm      LV E/e' medial:  11.2 LV IVS:        1.10 cm      LV e' lateral:   14.80 cm/s LVOT diam:     2.30 cm      LV E/e' lateral: 5.6 LV SV:         108 LV SV Index:   42 LVOT Area:     4.15 cm  LV Volumes (MOD) LV vol d, MOD A2C: 140.0 ml LV vol d, MOD A4C: 128.0 ml LV vol s, MOD A2C: 71.6 ml LV vol s, MOD A4C: 50.4 ml LV SV MOD A2C:     68.4 ml LV SV MOD A4C:     128.0 ml LV SV MOD BP:      74.4 ml RIGHT VENTRICLE RV Basal diam:  4.40 cm RV Mid diam:    4.20 cm RV S prime:     17.30 cm/s TAPSE (M-mode): 3.1 cm LEFT ATRIUM           Index        RIGHT ATRIUM           Index LA diam:      3.50 cm 1.37 cm/m   RA Area:     17.90 cm LA Vol (A4C): 44.8 ml 17.51 ml/m  RA Volume:   52.30 ml  20.44  ml/m  AORTIC VALVE                     PULMONIC VALVE AV Area (Vmax):    2.47 cm      PV Vmax:       0.90 m/s AV Area (Vmean):   2.40 cm      PV Peak grad:  3.2 mmHg AV Area (VTI):     2.97 cm  AV Vmax:           183.00 cm/s AV Vmean:          127.000 cm/s AV VTI:            0.362 m AV Peak Grad:      13.4 mmHg AV Mean Grad:      7.5 mmHg LVOT Vmax:         109.00 cm/s LVOT Vmean:        73.300 cm/s LVOT VTI:          0.259 m LVOT/AV VTI ratio: 0.72  AORTA Ao Root diam: 3.90 cm Ao Asc diam:  3.80 cm MITRAL VALVE               TRICUSPID VALVE MV Area (PHT): 3.31 cm    TR Peak grad:   7.4 mmHg MV Decel Time: 229 msec    TR Vmax:        136.00 cm/s MR Peak grad: 8.6 mmHg MR Vmax:      147.00 cm/s  SHUNTS MV E velocity: 82.70 cm/s  Systemic VTI:  0.26 m MV A velocity: 72.40 cm/s  Systemic Diam: 2.30 cm MV E/A ratio:  1.14 Vishnu Priya Mallipeddi Electronically signed by Lorelee Cover Mallipeddi Signature Date/Time: 08/19/2022/1:22:47 PM    Final    MR ABDOMEN W WO CONTRAST  Result Date: 08/19/2022 CLINICAL DATA:  Hepatic mass on recent CT.  Chronic hepatitis-C. EXAM: MRI ABDOMEN WITHOUT AND WITH CONTRAST TECHNIQUE: Multiplanar multisequence MR imaging of the abdomen was performed both before and after the administration of intravenous contrast. CONTRAST:  86m GADAVIST GADOBUTROL 1 MMOL/ML IV SOLN COMPARISON:  CT on 08/18/2022 FINDINGS: Lower chest: Bilateral lower lobe infiltrates show no significant change compared to recent CT. Hepatobiliary: Marked caudate lobe hypertrophy is suspicious for cirrhosis. Numerous masses are seen throughout the right hepatic lobe which show arterial phase hyperenhancement. Largest of these in segment 8 measures 7.7 x 5.7 cm and shows central areas of necrosis. The other hypervascular masses are smaller, ranging in size from less than 1 cm to 3.6 cm. These findings are highly suspicious for multifocal hepatocellular carcinoma. Gallbladder is unremarkable. No evidence of biliary ductal dilatation. Pancreas:  No mass or inflammatory changes. Spleen:  Within normal limits in size and appearance. Adrenals/Urinary Tract: No suspicious masses identified. Several benign Bosniak  category 1 and 2 renal cysts are seen (no followup imaging is recommended) . No evidence of hydronephrosis. Stomach/Bowel: Unremarkable. Vascular/Lymphatic: Mild lymphadenopathy is seen in the gastrohepatic ligament, porta hepatis, and retroperitoneum, with largest index lymph node in the gastrohepatic ligament measuring 1.6 cm short axis. No acute vascular findings. Other:  None. Musculoskeletal:  No suspicious bone lesions identified. IMPRESSION: Marked caudate lobe hypertrophy, highly suspicious for hepatic cirrhosis. Numerous hypervascular masses throughout the right hepatic lobe, highly suspicious for multifocal hepatocellular carcinoma. Recommend correlation with AFP and consider tissue sampling. Mild abdominal lymphadenopathy, with differential diagnosis including metastatic disease and reactive lymphadenopathy in setting of cirrhosis. Bilateral lower lobe infiltrates, as better demonstrated on recent  CT. Electronically Signed   By: Marlaine Hind M.D.   On: 08/19/2022 12:54   CT Abdomen Pelvis W Contrast  Result Date: 08/18/2022 CLINICAL DATA:  Acute generalized abdominal pain. EXAM: CT ABDOMEN AND PELVIS WITH CONTRAST TECHNIQUE: Multidetector CT imaging of the abdomen and pelvis was performed using the standard protocol following bolus administration of intravenous contrast. RADIATION DOSE REDUCTION: This exam was performed according to the departmental dose-optimization program which includes automated exposure control, adjustment of the mA and/or kV according to patient size and/or use of iterative reconstruction technique. CONTRAST:  142m OMNIPAQUE IOHEXOL 300 MG/ML  SOLN COMPARISON:  August 16, 2022.  October 13, 2018. FINDINGS: Lower chest: Mild bibasilar opacities are noted concerning for atelectasis, infiltrates or possibly scarring. Hepatobiliary: No gallstones or biliary dilatation is noted. There is interval development a 6.5 x 5.2 cm heterogeneously enhancing mass in the right hepatic lobe  which was not present on the prior MRI exam of 2019. This is highly concerning for neoplasm or malignancy. Pancreas: Unremarkable. No pancreatic ductal dilatation or surrounding inflammatory changes. Spleen: Normal in size without focal abnormality. Adrenals/Urinary Tract: Adrenal glands appear normal. Grossly stable multi-septated cystic lesion is seen involving the upper pole the right kidney with peripheral calcifications. No hydronephrosis or renal obstruction is noted. Urinary bladder is unremarkable. Stomach/Bowel: Stomach is within normal limits. Appendix appears normal. No evidence of bowel wall thickening, distention, or inflammatory changes. Vascular/Lymphatic: Aortic atherosclerosis. Mildly enlarged periaortic adenopathy is noted with largest lymph node measuring 12 mm. Mildly enlarged adenopathy is also noted in the gastrohepatic ligament, but this is unchanged compared to prior exam. Reproductive: Prostate is unremarkable. Other: No abdominal wall hernia or abnormality. No abdominopelvic ascites. Musculoskeletal: Status post left total hip arthroplasty. No acute osseous abnormality is noted. IMPRESSION: Interval development of 6.5 x 5.2 cm heterogeneously enhancing mass in right hepatic lobe. This is highly concerning for neoplasm or malignancy, and further evaluation with MRI is recommended. Mildly enlarged periaortic adenopathy is noted, with the largest lymph node measuring 12 mm. Metastatic disease cannot be excluded. Mildly enlarged adenopathy is also noted in the gastrohepatic ligament, but this is unchanged compared to prior exam. Stable bibasilar lung opacities are noted concerning for inflammation, atelectasis or possibly scarring. Grossly stable multi-septated cystic lesion is seen involving upper pole of right kidney with peripheral calcifications compared to prior exam of 2019, most likely representing benign etiology. Aortic Atherosclerosis (ICD10-I70.0). Electronically Signed   By: JMarijo ConceptionM.D.   On: 08/18/2022 18:18    Impression: Hepatic lesion - CT A/P 08/18/2022 Interval development of 6.5 x 5.2 cm heterogeneously enhancing mass in right hepatic lobe. This is highly concerning for neoplasm or malignancy, and further evaluation with MRI is recommended.   Mildly enlarged periaortic adenopathy is noted, with the largest lymph node measuring 12 mm. Metastatic disease cannot be excluded. Mildly enlarged adenopathy is also noted in the gastrohepatic ligament, but this is unchanged compared to prior exam.  - MRI Abdomen 08/19/2022 Marked caudate lobe hypertrophy, highly suspicious for hepatic cirrhosis.   Numerous hypervascular masses throughout the right hepatic lobe, highly suspicious for multifocal hepatocellular carcinoma. Recommend correlation with AFP and consider tissue sampling.   Mild abdominal lymphadenopathy, with differential diagnosis including metastatic disease and reactive lymphadenopathy in setting of cirrhosis.   Bilateral lower lobe infiltrates, as better demonstrated on recent CT.  - Prior history of Hepatitis C (?), treated with interferon 3-4 months - Hepatitis panel pending - AFP pending - LFTs normal, no  abdominal pain - PT 23.9, INR 2.2 - Seen, possible liver biopsy tomorrow pending INR.  Otherwise may be done outpatient.  - Care team discussing outpatient management, CXR pending  Plan: MRI/CT showing liver mass highly suspicious for malignancy, metastatic disease not excluded. Will monitor pending labs - AFP, hepatitis panel.  Will discuss further management with Dr. Michail Sermon who will see patient later today. Continue to trend LFTs. Timing of liver biopsy TBD. [facility] will follow.    LOS: 0 days   Angelique Holm  PA-C 08/20/2022, 9:10 AM  Contact #  [phone]

## 2022-08-20 NOTE — TOC Initial Note (Signed)
Transition of Care [facility]) - Initial/Assessment Note    Patient Details  Name: Christopher Marsh MRN: [id number] Date of Birth: [birth year]  Transition of Care [facility]) CM/SW Contact:    Leeroy Cha, RN Phone Number: 08/20/2022, 10:07 AM  Clinical Narrative:                  Transition of Care (TOC) Screening Note   Patient Details  Name: Christopher Marsh Date of Birth: 07/07/1961   Transition of Care [facility]) CM/SW Contact:    Leeroy Cha, RN Phone Number: 08/20/2022, 10:07 AM    Transition of Care Department (TOC) has reviewed patient and no TOC needs have been identified at this time. We will continue to monitor patient advancement through interdisciplinary progression rounds. If new patient transition needs arise, please place a TOC consult.    Expected Discharge Plan: Home/Self Care Barriers to Discharge: Continued Medical Work up   Patient Goals and CMS Choice Patient states their goals for this hospitalization and ongoing recovery are:: to go home CMS Medicare.gov Compare Post Acute Care list provided to:: Patient    Expected Discharge Plan and Services Expected Discharge Plan: Home/Self Care   Discharge Planning Services: CM Consult   Living arrangements for the past 2 months: Single Family Home                                      Prior Living Arrangements/Services Living arrangements for the past 2 months: Single Family Home Lives with:: Spouse Patient language and need for interpreter reviewed:: Yes Do you feel safe going back to the place where you live?: Yes            Criminal Activity/Legal Involvement Pertinent to Current Situation/Hospitalization: No - Comment as needed  Activities of Daily Living Home Assistive Devices/Equipment: Other (Comment) (compression socks and machine like SCD) ADL Screening (condition at time of admission) Patient's cognitive ability adequate to safely complete daily activities?: No Is the patient  deaf or have difficulty hearing?: No Does the patient have difficulty seeing, even when wearing glasses/contacts?: No Does the patient have difficulty concentrating, remembering, or making decisions?: No Patient able to express need for assistance with ADLs?: Yes Does the patient have difficulty dressing or bathing?: No Independently performs ADLs?: Yes (appropriate for developmental age) Does the patient have difficulty walking or climbing stairs?: No Weakness of Legs: None Weakness of Arms/Hands: None  Permission Sought/Granted                  Emotional Assessment Appearance:: Appears stated age Attitude/Demeanor/Rapport: Engaged Affect (typically observed): Calm Orientation: : Oriented to Self, Oriented to Place, Oriented to  Time, Oriented to Situation Alcohol / Substance Use: Tobacco Use, Alcohol Use (no current smoking, current etoh intake-moderate) Psych Involvement: No (comment)  Admission diagnosis:  Pneumonia [J18.9] Hepatic lesion [K76.9] Multifocal pneumonia [J18.9] Patient Active Problem List   Diagnosis Date Noted   Multifocal pneumonia 08/19/2022   Hepatic lesion 08/19/2022   Snoring 08/19/2022   Primary insomnia 08/19/2022   Liver mass, right lobe 08/19/2022   Pneumonia 08/18/2022   Lymphedema 04/20/2022   Anticoagulant long-term use 01/04/2022   Erythrocytosis 12/18/2021   DVT of deep femoral vein, right ([place]) 12/18/2021   COVID-19 virus infection 09/01/2020   Status post total replacement of hip 07/22/2017   Essential hypertension 08/28/2016   Actinic keratosis 11/30/2015   DM2 (diabetes mellitus, type  2) ([place]) 02/15/2015   Morbid obesity ([place]) 06/18/2013   Left hip pain 04/02/2012   HSV (herpes simplex virus) infection 04/02/2012   CONTACT DERMATITIS&OTHER ECZEMA DUE UNSPEC CAUSE 09/14/2009   ERECTILE DYSFUNCTION 12/08/2008   Venous (peripheral) insufficiency 12/08/2008   History of hepatitis C 12/08/2008   Hyperlipemia 11/24/2008    ESOPHAGITIS, REFLUX 11/18/2008   PCP:  Laurey Morale, MD Pharmacy:   [id number] [place] [id number]: [phone]: [phone]    Social Determinants of Health (SDOH) Interventions    Readmission Risk Interventions   No data to display

## 2022-08-20 NOTE — Progress Notes (Signed)
Referring Physician(s): Rai,R  Supervising Physician: Aletta Edouard  Patient Status:  Christopher Marsh - In-pt  Chief Complaint:  Liver lesions  Subjective: Patient familiar to IR service from random liver biopsy in 2000. He is a 61 year old male with past medical history significant for hyperlipidemia, obesity, GERD, hypertension, venous insufficiency, lymphedema, esophagitis, hepatitis C, diabetes, recurrent DVTs on outpatient Coumadin with post thrombotic syndrome ,positive lupus anticoagulant , antiphospholipid antibody syndrome and recently treated pneumonia.  Imaging to evaluate pneumonia revealed multiple new hepatic lesions enlarging lymph nodes in the upper abdomen and chest concerning for metastatic disease.  MRI of the abdomen yesterday revealed:  Marked caudate lobe hypertrophy, highly suspicious for hepatic cirrhosis.   Numerous hypervascular masses throughout the right hepatic lobe, highly suspicious for multifocal hepatocellular carcinoma.   Mild abdominal lymphadenopathy, with differential diagnosis including metastatic disease and reactive lymphadenopathy in setting of cirrhosis.   Bilateral lower lobe infiltrates, as better demonstrated on recent CT.  AFP pending; WBC normal, hemoglobin normal, platelets 149k, PT 23.9, INR 2.2.  Request now received for image guided liver lesion biopsy for further evaluation.  Currently denies fever, headache, worsening chest pain, worsening dyspnea, cough, abdominal/back pain, nausea, vomiting or bleeding.  Past Medical History:  Diagnosis Date   Antiphospholipid antibody syndrome (HCC)    Arthritis    Diabetes mellitus without complication (HCC)    DVT (deep venous thrombosis) (HCC)    Lt leg   Dyspnea    Elevated lipids    Erythrocytosis    Hyperlipidemia    Hypertension    Lower extremity edema    Multinodular thyroid    Post-thrombotic syndrome    Past Surgical History:  Procedure Laterality Date   COLONOSCOPY WITH  PROPOFOL N/A 09/23/2018   Per Dr. Alice Reichert, polyp, repeat in 3 yrs    EYE MUSCLE SURGERY     JOINT REPLACEMENT Left 07/22/2017   TOTAL HIP ARTHROPLASTY Left 07/22/2017   Procedure: TOTAL HIP ARTHROPLASTY;  Surgeon: Dereck Leep, MD;  Location: [facility] ORS;  Service: Orthopedics;  Laterality: Left;      Allergies: Patient has no known allergies.  Medications: Prior to Admission medications   Medication Sig Start Date End Date Taking? Authorizing Provider  albuterol (VENTOLIN HFA) 108 (90 Base) MCG/ACT inhaler Inhale 2 puffs into the lungs every 4 (four) hours as needed. Patient taking differently: Inhale 2 puffs into the lungs every 4 (four) hours as needed for wheezing or shortness of breath. 05/11/22  Yes Laurey Morale, MD  amLODipine (NORVASC) 5 MG tablet Take 1 tablet (5 mg total) by mouth daily. 08/08/22  Yes Laurey Morale, MD  glipiZIDE (GLUCOTROL) 10 MG tablet Take 1 tablet (10 mg total) by mouth 2 (two) times daily before a meal. Patient taking differently: Take 10 mg by mouth daily before breakfast. 08/08/22  Yes Laurey Morale, MD  magnesium oxide (MAG-OX) 400 (240 Mg) MG tablet Take 400 mg by mouth in the morning.   Yes [provider]  metFORMIN (GLUCOPHAGE) 500 MG tablet TAKE TWO TABLETS BY MOUTH EVERY MORNING WITH BREAKFAST Patient taking differently: Take 1,000 mg by mouth daily with breakfast. 08/06/22  Yes Laurey Morale, MD  pravastatin (PRAVACHOL) 20 MG tablet Take 1 tablet (20 mg total) by mouth daily. 08/08/22  Yes Laurey Morale, MD  triamcinolone cream (KENALOG) 0.1 % Apply 1 Application topically 3 (three) times daily. Patient taking differently: Apply 1 Application topically 3 (three) times daily as needed (for bilateral, below-the-knees skin irritation).  07/03/22  Yes Laurey Morale, MD  warfarin (COUMADIN) 5 MG tablet TAKE 1 TABLET BY MOUTH DAILY EXCEPT TAKE 1 1/2 TABLETS ON 'SUNDAYS, WEDNESDAYS, AND FRIDAYS OR AS DIRECTED BY ANTICOAGULATION CLINIC Patient  taking differently: Take 5-7.5 mg by mouth See admin instructions. Take 7.5 mg by mouth in the evening on Sunday and 5 mg on Mon/Tues/Wed/Thurs/Fri/Sat 07/25/22  Yes Fry, Stephen A, MD  benzonatate (TESSALON) 100 MG capsule Take 1 capsule (100 mg total) by mouth every 8 (eight) hours. Patient not taking: Reported on 08/18/2022 07/19/22   Eaves, Lesley B, PA-C  glucose blood test strip 1 each by Other route as needed for other. accu chek aviva plusUse as instructed    [provider]  ketoconazole (NIZORAL) 2 % cream APPLY TO AFFECTED AREA TWICE DAILY AS NEEDED FOR IRRITATION Patient not taking: Reported on 08/18/2022 09/18/21   Fry, Stephen A, MD  promethazine-dextromethorphan (PROMETHAZINE-DM) 6.25-15 MG/5ML syrup Take 5 mLs by mouth 4 (four) times daily as needed. Patient not taking: Reported on 08/18/2022 07/19/22   Eaves, Lesley B, PA-C  tadalafil (CIALIS) 20 MG tablet Take 0.5-1 tablets (10-20 mg total) by mouth every other day as needed for erectile dysfunction. Patient not taking: Reported on 08/18/2022 08/08/22   Fry, Stephen A, MD     Vital Signs: BP (!) 152/75 (BP Location: Right Arm)   Pulse 65   Temp 98.7 F (37.1 C) (Oral)   Resp 16   Ht 6\' 3" (1.905 m)   Wt 287 lb (130.2 kg)   SpO2 93%   BMI 35.87 kg/m   Physical Exam awake, alert.  Chest with few scattered rhonchi on the left, right clear.  Heart with regular rate and rhythm.  Abdomen soft, positive bowel sounds, nontender.  2+ bilateral lower extremity edema with signs of venous insufficiency  Imaging: ECHOCARDIOGRAM COMPLETE  Result Date: 08/19/2022    ECHOCARDIOGRAM REPORT   Patient Name:   Christopher Marsh Date of Exam: 08/19/2022 Medical Rec #:  [id number]        Height:       75.0 in Accession #:    [id number]       Weight:       287.0 lb Date of Birth:  10/17/1961         BSA:          2.558 m Patient Age:    61 years         BP:           164/75 mmHg Patient Gender: M                HR:           71'$  bpm. Exam  Location:  Inpatient Procedure: 2D Echo and Intracardiac Opacification Agent Indications:    CHF  History:        Patient has no prior history of Echocardiogram examinations.                 Risk Factors:Hypertension, Diabetes and Dyslipidemia.  Sonographer:    Harvie Junior Referring Phys: [id number] RIPUDEEP K RAI  Sonographer Comments: Technically difficult study due to poor echo windows and patient is obese. Image acquisition challenging due to patient body habitus and Image acquisition challenging due to respiratory motion. IMPRESSIONS  1. Left ventricular ejection fraction, by estimation, is 60 to 65%. The left ventricle has normal function. The left ventricle has no regional wall motion abnormalities. Left ventricular diastolic parameters were normal.  2. Right ventricular systolic function is normal. The right ventricular size is mildly enlarged. Tricuspid regurgitation signal is inadequate for assessing PA pressure.  3. No valvular stenoses or regurgitation noted. FINDINGS  Left Ventricle: Left ventricular ejection fraction, by estimation, is 60 to 65%. The left ventricle has normal function. The left ventricle has no regional wall motion abnormalities. The left ventricular internal cavity size was normal in size. There is  no left ventricular hypertrophy. Left ventricular diastolic parameters were normal. Right Ventricle: The right ventricular size is mildly enlarged. Right vetricular wall thickness was not well visualized. Right ventricular systolic function is normal. Tricuspid regurgitation signal is inadequate for assessing PA pressure. The tricuspid regurgitant velocity is 1.36 m/s, and with an assumed right atrial pressure of 3 mmHg, the estimated right ventricular systolic pressure is 27.2 mmHg. Left Atrium: Left atrial size was normal in size. Right Atrium: Right atrial size was normal in size. Pericardium: There is no evidence of pericardial effusion. Mitral Valve: The mitral valve is grossly normal. No  evidence of mitral valve regurgitation. No evidence of mitral valve stenosis. Tricuspid Valve: The tricuspid valve is not well visualized. Tricuspid valve regurgitation is not demonstrated. No evidence of tricuspid stenosis. Aortic Valve: The aortic valve was not well visualized. Aortic valve regurgitation is not visualized. No aortic stenosis is present. Aortic valve mean gradient measures 7.5 mmHg. Aortic valve peak gradient measures 13.4 mmHg. Aortic valve area, by VTI measures 2.97 cm. Pulmonic Valve: The pulmonic valve was not well visualized. Pulmonic valve regurgitation is not visualized. No evidence of pulmonic stenosis. Aorta: The aortic root is normal in size and structure. Venous: The inferior vena cava was not well visualized. IAS/Shunts: No atrial level shunt detected by color flow Doppler.  LEFT VENTRICLE PLAX 2D LVIDd:         4.20 cm      Diastology LVIDs:         2.90 cm      LV e' medial:    7.40 cm/s LV PW:         1.10 cm      LV E/e' medial:  11.2 LV IVS:        1.10 cm      LV e' lateral:   14.80 cm/s LVOT diam:     2.30 cm      LV E/e' lateral: 5.6 LV SV:         108 LV SV Index:   42 LVOT Area:     4.15 cm  LV Volumes (MOD) LV vol d, MOD A2C: 140.0 ml LV vol d, MOD A4C: 128.0 ml LV vol s, MOD A2C: 71.6 ml LV vol s, MOD A4C: 50.4 ml LV SV MOD A2C:     68.4 ml LV SV MOD A4C:     128.0 ml LV SV MOD BP:      74.4 ml RIGHT VENTRICLE RV Basal diam:  4.40 cm RV Mid diam:    4.20 cm RV S prime:     17.30 cm/s TAPSE (M-mode): 3.1 cm LEFT ATRIUM           Index        RIGHT ATRIUM           Index LA diam:      3.50 cm 1.37 cm/m   RA Area:     17.90 cm LA Vol (A4C): 44.8 ml 17.51 ml/m  RA Volume:   52.30 ml  20.44 ml/m  AORTIC VALVE  PULMONIC VALVE AV Area (Vmax):    2.47 cm      PV Vmax:       0.90 m/s AV Area (Vmean):   2.40 cm      PV Peak grad:  3.2 mmHg AV Area (VTI):     2.97 cm AV Vmax:           183.00 cm/s AV Vmean:          127.000 cm/s AV VTI:            0.362 m AV  Peak Grad:      13.4 mmHg AV Mean Grad:      7.5 mmHg LVOT Vmax:         109.00 cm/s LVOT Vmean:        73.300 cm/s LVOT VTI:          0.259 m LVOT/AV VTI ratio: 0.72  AORTA Ao Root diam: 3.90 cm Ao Asc diam:  3.80 cm MITRAL VALVE               TRICUSPID VALVE MV Area (PHT): 3.31 cm    TR Peak grad:   7.4 mmHg MV Decel Time: 229 msec    TR Vmax:        136.00 cm/s MR Peak grad: 8.6 mmHg MR Vmax:      147.00 cm/s  SHUNTS MV E velocity: 82.70 cm/s  Systemic VTI:  0.26 m MV A velocity: 72.40 cm/s  Systemic Diam: 2.30 cm MV E/A ratio:  1.14 Vishnu Priya Mallipeddi Electronically signed by Lorelee Cover Mallipeddi Signature Date/Time: 08/19/2022/1:22:47 PM    Final    MR ABDOMEN W WO CONTRAST  Result Date: 08/19/2022 CLINICAL DATA:  Hepatic mass on recent CT.  Chronic hepatitis-C. EXAM: MRI ABDOMEN WITHOUT AND WITH CONTRAST TECHNIQUE: Multiplanar multisequence MR imaging of the abdomen was performed both before and after the administration of intravenous contrast. CONTRAST:  42m GADAVIST GADOBUTROL 1 MMOL/ML IV SOLN COMPARISON:  CT on 08/18/2022 FINDINGS: Lower chest: Bilateral lower lobe infiltrates show no significant change compared to recent CT. Hepatobiliary: Marked caudate lobe hypertrophy is suspicious for cirrhosis. Numerous masses are seen throughout the right hepatic lobe which show arterial phase hyperenhancement. Largest of these in segment 8 measures 7.7 x 5.7 cm and shows central areas of necrosis. The other hypervascular masses are smaller, ranging in size from less than 1 cm to 3.6 cm. These findings are highly suspicious for multifocal hepatocellular carcinoma. Gallbladder is unremarkable. No evidence of biliary ductal dilatation. Pancreas:  No mass or inflammatory changes. Spleen:  Within normal limits in size and appearance. Adrenals/Urinary Tract: No suspicious masses identified. Several benign Bosniak category 1 and 2 renal cysts are seen (no followup imaging is recommended) . No evidence of  hydronephrosis. Stomach/Bowel: Unremarkable. Vascular/Lymphatic: Mild lymphadenopathy is seen in the gastrohepatic ligament, porta hepatis, and retroperitoneum, with largest index lymph node in the gastrohepatic ligament measuring 1.6 cm short axis. No acute vascular findings. Other:  None. Musculoskeletal:  No suspicious bone lesions identified. IMPRESSION: Marked caudate lobe hypertrophy, highly suspicious for hepatic cirrhosis. Numerous hypervascular masses throughout the right hepatic lobe, highly suspicious for multifocal hepatocellular carcinoma. Recommend correlation with AFP and consider tissue sampling. Mild abdominal lymphadenopathy, with differential diagnosis including metastatic disease and reactive lymphadenopathy in setting of cirrhosis. Bilateral lower lobe infiltrates, as better demonstrated on recent CT. Electronically Signed   By: JMarlaine HindM.D.   On: 08/19/2022 12:54   CT Abdomen Pelvis W Contrast  Result  Date: 08/18/2022 CLINICAL DATA:  Acute generalized abdominal pain. EXAM: CT ABDOMEN AND PELVIS WITH CONTRAST TECHNIQUE: Multidetector CT imaging of the abdomen and pelvis was performed using the standard protocol following bolus administration of intravenous contrast. RADIATION DOSE REDUCTION: This exam was performed according to the departmental dose-optimization program which includes automated exposure control, adjustment of the mA and/or kV according to patient size and/or use of iterative reconstruction technique. CONTRAST:  172m OMNIPAQUE IOHEXOL 300 MG/ML  SOLN COMPARISON:  August 16, 2022.  October 13, 2018. FINDINGS: Lower chest: Mild bibasilar opacities are noted concerning for atelectasis, infiltrates or possibly scarring. Hepatobiliary: No gallstones or biliary dilatation is noted. There is interval development a 6.5 x 5.2 cm heterogeneously enhancing mass in the right hepatic lobe which was not present on the prior MRI exam of 2019. This is highly concerning for neoplasm  or malignancy. Pancreas: Unremarkable. No pancreatic ductal dilatation or surrounding inflammatory changes. Spleen: Normal in size without focal abnormality. Adrenals/Urinary Tract: Adrenal glands appear normal. Grossly stable multi-septated cystic lesion is seen involving the upper pole the right kidney with peripheral calcifications. No hydronephrosis or renal obstruction is noted. Urinary bladder is unremarkable. Stomach/Bowel: Stomach is within normal limits. Appendix appears normal. No evidence of bowel wall thickening, distention, or inflammatory changes. Vascular/Lymphatic: Aortic atherosclerosis. Mildly enlarged periaortic adenopathy is noted with largest lymph node measuring 12 mm. Mildly enlarged adenopathy is also noted in the gastrohepatic ligament, but this is unchanged compared to prior exam. Reproductive: Prostate is unremarkable. Other: No abdominal wall hernia or abnormality. No abdominopelvic ascites. Musculoskeletal: Status post left total hip arthroplasty. No acute osseous abnormality is noted. IMPRESSION: Interval development of 6.5 x 5.2 cm heterogeneously enhancing mass in right hepatic lobe. This is highly concerning for neoplasm or malignancy, and further evaluation with MRI is recommended. Mildly enlarged periaortic adenopathy is noted, with the largest lymph node measuring 12 mm. Metastatic disease cannot be excluded. Mildly enlarged adenopathy is also noted in the gastrohepatic ligament, but this is unchanged compared to prior exam. Stable bibasilar lung opacities are noted concerning for inflammation, atelectasis or possibly scarring. Grossly stable multi-septated cystic lesion is seen involving upper pole of right kidney with peripheral calcifications compared to prior exam of 2019, most likely representing benign etiology. Aortic Atherosclerosis (ICD10-I70.0). Electronically Signed   By: JMarijo ConceptionM.D.   On: 08/18/2022 18:18    Labs:  CBC: Recent Labs    08/08/22 1055  08/18/22 1651 08/19/22 0409 08/20/22 0352  WBC 6.2 6.5 3.9* 4.1  HGB 17.7* 18.0* 15.9 16.6  HCT 53.0* 54.7* 47.8 51.4  PLT 220.0 215 179 149*    COAGS: Recent Labs    08/08/22 1055 08/18/22 1728 08/19/22 0409 08/20/22 0352  INR 4.0* 2.4* 2.6* 2.2*    BMP: Recent Labs    06/11/22 1356 08/08/22 1055 08/18/22 1651 08/19/22 0409 08/20/22 0352  NA 135 143 138 139 137  K 4.0 4.8 4.5 4.1 3.6  CL 99 102 104 106 104  CO2 '26 29 27 26 22  '$ GLUCOSE 176* 83 109* 122* 107*  BUN '10 10 10 8 8  '$ CALCIUM 8.5* 9.4 8.9 8.2* 8.2*  CREATININE 0.84 0.72 0.95 0.80 0.65  GFRNONAA >60  --  >60 >60 >60    LIVER FUNCTION TESTS: Recent Labs    08/08/22 1055 08/18/22 1651 08/19/22 0409 08/20/22 0352  BILITOT 0.9 1.2 0.9 0.9  AST 32 '30 26 25  '$ ALT '31 30 24 23  '$ ALKPHOS 79 73 57 63  PROT 6.5 6.9 5.4* 5.9*  ALBUMIN 4.1 4.0 3.1* 3.3*    Assessment and Plan: Patient familiar to IR service from random liver biopsy in 2000. He is a 61 year old male with past medical history significant for hyperlipidemia, obesity, GERD, hypertension, venous insufficiency, lymphedema, esophagitis, hepatitis C, diabetes, recurrent DVTs on outpatient Coumadin with post thrombotic syndrome ,positive lupus anticoagulant , antiphospholipid antibody syndrome and recently treated pneumonia.  Imaging to evaluate pneumonia revealed multiple new hepatic lesions enlarging lymph nodes in the upper abdomen and chest concerning for metastatic disease.  MRI of the abdomen yesterday revealed:  Marked caudate lobe hypertrophy, highly suspicious for hepatic cirrhosis.   Numerous hypervascular masses throughout the right hepatic lobe, highly suspicious for multifocal hepatocellular carcinoma.   Mild abdominal lymphadenopathy, with differential diagnosis including metastatic disease and reactive lymphadenopathy in setting of cirrhosis.   Bilateral lower lobe infiltrates, as better demonstrated on recent CT.  AFP pending; WBC  normal, hemoglobin normal, platelets 149k, PT 23.9, INR 2.2.  Request now received for image guided liver lesion biopsy for further evaluation.  Imaging studies reviewed by Dr. Vernard Gambles.Risks and benefits of procedure was discussed with the patient/spouse including, but not limited to bleeding, infection, damage to adjacent structures or low yield requiring additional tests. Once PT/INR within acceptable limits , preferably less than 1.8 will plan to proceed.   All of the questions were answered and there is agreement to proceed.  Consent signed and in chart.    Electronically Signed: D. Rowe Robert, PA-C 08/20/2022, 11:21 AM   I spent a total of 25 Minutes at the the patient's bedside AND on the patient's hospital floor or unit, greater than 50% of which was counseling/coordinating care for image guided liver lesion biopsy    Patient ID: Christopher Marsh, male   DOB: [birth year], 61 y.o.   MRN: [id number]

## 2022-08-20 NOTE — Telephone Encounter (Signed)
Received notification from nurse navigator at [facility] that Dr. Benay Spice received a call regarding this patient. Currently hospitalized with pneumonia and liver mass. He has seen Dr. Tasia Catchings for DVT in the past. Will arrange follow up with her following discharge. Will follow.

## 2022-08-20 NOTE — Sedation Documentation (Signed)
Patient transported to Diagnostic X-Ray via bed by this RN. Awake and alert. VSS.

## 2022-08-20 NOTE — Progress Notes (Signed)
NAME:  Christopher Marsh, MRN:  [id number], DOB:  [birth year], LOS: 0 ADMISSION DATE:  08/18/2022, CONSULTATION DATE:  08/19/2022 REFERRING MD:  Dr. Billy Fischer, ER, CHIEF COMPLAINT:  Pneumonia  History of Present Illness:  61 yo male with very remote hx of smoking was seen in ER in September 2023 for headache, cough, dyspnea, and fatigue.  He was found to have Left lower lung treated community acquired pneumonia and treated with augmentin and zithromax.  He say his PCP on 08/08/22 and reported symptomatic improvement with only residual non-productive cough.  Noted to still have abnormal lung sounds and concern for emphysema, so CT chest as ordered.  This showed diffuse ASD in Lt lower lobe and some aspects of Lt upper lobe, and Rt upper and lower lobes concerning for multifocal pneumonia.  Also found to have 6.5 x 5.2 cm mass in Rt hepatic lobe concerning for malignancy.  He was advised by his PCP to come to the hospital for admission.  PCCM consulted to assist with assessment of persistent pulmonary air space disease.    Pertinent Medical History:  OA, DM type 2, recurrent DVT, HLD, HTN, Antiphospholipid Ab syndrome, Erythrocytosis, COVID 16 August 2020, Multinodular thyroid  Significant Hospital Events: Including procedures, antibiotic start and stop dates in addition to other pertinent events   10/21 Admit, start ABx  Interim History / Subjective:  No significant events overnight Tolerated BiPAP for a few hours, ultimately became uncomfortable (primarily mask) and was removed No CP, SOB, fevers, coughing/hemoptysis, night sweats Feeling generally well, didn't feel bad coming in Awaiting repeat CXR Continues on ceftriaxone, azithromycin ?IR liver biopsy in the net 24-48H, pending INR  Objective:  Blood pressure (!) 152/75, pulse 65, temperature 98.7 F (37.1 C), temperature source Oral, resp. rate 16, height '6\' 3"'$  (1.905 m), weight 130.2 kg, SpO2 93 %.        Intake/Output Summary  (Last 24 hours) at 08/20/2022 1019 Last data filed at 08/20/2022 6433 Gross per 24 hour  Intake 348 ml  Output 1725 ml  Net -1377 ml    Filed Weights   08/18/22 1341  Weight: 130.2 kg   Physical Examination: General: Middle-aged man, sitting up in chair at bedside, in NAD. HEENT: [place]/AT, anicteric sclera, PERRL, moist mucous membranes. Neuro: Awake, oriented x 4. Responds to verbal stimuli. Following commands consistently. Moves all 4 extremities spontaneously. CV: RRR, no m/g/r. PULM: Breathing even and unlabored on RA. Lung fields clear on R, some scattered rhonchi/bronchial sounds on L. GI: Soft, nontender, nondistended. Extremities: Chronic bilateral 2+ nonpitting symmetric LE edema noted. Skin: Warm/dry, +venous stasis skin changes of bilateral lower limbs.   Resolved Hospital Problem List:     Assessment & Plan:   Persistent pulmonary infiltrates Radiographic appearance is suggestive of recurrent pneumonia although his symptom constellation is minimal at this time - Continue CAP coverage with ceftriaxone/azithromycin - Follow BCx (NGTD), RVP negative - F/u repeat CXR 10/23 - May require further workup including serologies, ?bronchoscopy if CXR/symptoms unimproved  Erythrocytosis Concern for possible sleep apnea. Did not tolerate CPAP while in the hospital. - Recommend further sleep study/assessment as an outpatient - May benefit from further CPAP trial  Insomnia Discussed concern about using alcohol as a sedative agent to help with sleep. - Query ?OSA - Recommend further sleep evaluation as above  Liver mass CT A/P 10/21 with interval development of 6.5 x 5.2cm heterogenously enhancing mass in R hepatic lobe, c/f neoplasm or malignancy. MRI Abdomen 10/22 with marked caudate lobe  hypertrophy, highly suspicious for hepatic cirrhosis; numerous hypervascular masses throughout the R hepatic lobe, c/f multifocal HCC. - Hep B and C antibodies pending - AFP pending - IR  liver biopsy pending, waiting on safe INR to proceed  Recurrent DVT with Antiphospholipid syndrome and post-thrombotic syndrome Followed by Dr. Earlie Server with hematology as an outpatient. - AC per primary team - Monitoring INR for IR liver biopsy  Leukopenia - Trend CBC, improving  Best Practice (right click and "Reselect all SmartList Selections" daily)   Diet/type: Regular consistency (see orders) DVT prophylaxis: Coumadin GI prophylaxis: N/A Lines: N/A Foley:  N/A Code Status:  full code Last date of multidisciplinary goals of care discussion [Updated patient/wife at bedside 10/23AM]  Signature:   Lestine Mount, PA-C [facility] 08/20/22 10:20 AM  Please see [email or web address] for pager details.  From 7A-7P if no response, please call [phone] After hours, please call ELink [phone]

## 2022-08-20 NOTE — Progress Notes (Signed)
[facility]                                                                              Jaydenn Boccio, is a 61 y.o. male, DOB - 1961-09-10, INO:[id number] Admit date - 08/18/2022    Outpatient Primary MD for the patient is Laurey Morale, MD  LOS - 0  days  Chief Complaint  Patient presents with   Cough    Shortness of Breath   Shortness of Breath       Brief summary   Patient is a 61 year old male with hyperlipidemia, obesity, venous insufficiency, lymphedema, esophagitis, hepatitis C, diabetes mellitus, recurrent DVTs presented with cough and shortness of breath.  Patient reported cough for the past 4 to 5 months with shortness of breath.  He did have an episode in September this year when he was seen at urgent care with fevers, productive cough and shortness of breath.  He was diagnosed with LLL pneumonia and prescribed Augmentin and a Zithromax.  He did have some improvement but continued to have chronic cough. Outpatient CT of the chest was ordered which showed multifocal pneumonia and liver masses. Patient presented to ED for further evaluation. SBP 150s to 160s.  Hemoglobin 18.  INR 2.4.  Troponin normal.  BNP 102. CT abdomen pelvis showed new mass in the right hepatic lobe concerning for malignancy.  Periaortic lymphadenopathy noted.  Stable basilar opacities representing infection/inflammation versus atelectasis versus scarring. Pulmonology was consulted. Admitted for further work-up  Assessment & Plan    Principal Problem:   Multifocal pneumonia with shortness of breath -Presented with persistent cough, shortness of breath, CT chest 10/19 showed multifocal airspace disease in both lungs left greater than right compatible with multifocal pneumonia.  Patient also reported dyspnea on exertion, fatigue worsening in the last 1 year -Continue IV Zithromax, Rocephin, continue flutter valve -Procalcitonin <0.1 -Influenza panel negative, COVID-negative, HIV  negative -2D echo showed EF of 60 to 65%, no regional wall motion abnormalities. -Pulmonary consulted, recommendation repeat two-view chest x-ray on 10/23, if does not improve would need labs serologies and possibly bronch  Active Problems:   Hepatic lesion, underlying history of ?hepatitis C -CT abd pelvis showed 6.5x 5.2 cm mass in the right hepatic lobe, ?neoplasm.   -Family history of hepatitis C (brother),, had 80-monthcourse of interferon, per patient was prophylactic.  -LFTs normal, no abdominal pain.  -MRCP showed marked caudate lobe hypertrophy, highly suspicious for hepatic cirrhosis, numerous hypervascular masses throughout the right hepatic lobe suspicious for multifocal HCC.  Mild abdominal lymph neuropathy.  Bilateral lower lobe infiltrates -GI consulted.  AFP pending -IR consulted for liver biopsy.  Warfarin on hold, INR 2.2, received vitamin K 5 mg p.o. x1, ordered 2 units FFP.  Per INR goal INR <1.8 for liver biopsy -Keep n.p.o. today for possible biopsy, recheck INR at noon  Recurrent DVTs, in the setting of positive lupus anticoagulant -On chronic warfarin -Continue to hold warfarin    Hyperlipemia -LFTs normal, continue statin  GERD, history of esophagitis gastritis -Not on PPI outpatient      DM2 (diabetes mellitus, type 2) ([place], NIDDM, controlled -Hold metformin,  glipizide, -Continue SSI while inpatient -HbA1c 5.8    Essential hypertension -BP stable, continue amlodipine   Obesity, venous insufficiency Estimated body mass index is 35.87 kg/m as calculated from the following:   Height as of this encounter: '6\' 3"'$  (1.905 m).   Weight as of this encounter: 130.2 kg.  Code Status: Full code DVT Prophylaxis:    INR therapeutic, hold warfarin  For possible liver biopsy in a.m.  Level of Care: Level of care: Telemetry Family Communication: Updated patient's wife at the bedside today   Disposition Plan:      Remains inpatient appropriate: Work-up in  progress   Procedures:  MRCP abdomen  Consultants:   Pulmonology  Antimicrobials:   Anti-infectives (From admission, onward)    Start     Dose/Rate Route Frequency Ordered Stop   08/19/22 2200  cefTRIAXone (ROCEPHIN) 2 g in sodium chloride 0.9 % 100 mL IVPB        2 g 200 mL/hr over 30 Minutes Intravenous Every 24 hours 08/19/22 1047     08/18/22 2200  cefTRIAXone (ROCEPHIN) 1 g in sodium chloride 0.9 % 100 mL IVPB  Status:  Discontinued        1 g 200 mL/hr over 30 Minutes Intravenous Every 24 hours 08/18/22 1950 08/19/22 1047   08/18/22 2200  azithromycin (ZITHROMAX) 250 mg in dextrose 5 % 125 mL IVPB        250 mg 127.5 mL/hr over 60 Minutes Intravenous Every 24 hours 08/18/22 1950     08/18/22 1830  vancomycin (VANCOREADY) IVPB 2000 mg/400 mL        2,000 mg 200 mL/hr over 120 Minutes Intravenous  Once 08/18/22 1738 08/19/22 1206   08/18/22 1745  ceFEPIme (MAXIPIME) 2 g in sodium chloride 0.9 % 100 mL IVPB        2 g 200 mL/hr over 30 Minutes Intravenous  Once 08/18/22 1738 08/18/22 1842          Medications  sodium chloride   Intravenous Once   amLODipine  5 mg Oral Daily   insulin aspart  0-15 Units Subcutaneous TID WC   pravastatin  20 mg Oral Daily   sodium chloride flush  3 mL Intravenous Q12H      Subjective:   Jaran Konieczny was seen and examined today.  No acute complaints, multiple questions answered regarding pending work-up.  No acute cough, chest pain or shortness of breath.  Awaiting biopsy.  No abdominal pain, nausea or vomiting.  Objective:   Vitals:   08/19/22 2041 08/19/22 2312 08/20/22 0433 08/20/22 0451  BP: (!) 158/63  (!) 166/83 (!) 152/75  Pulse: 82  73 65  Resp: '16 16 16   '$ Temp: 98.9 F (37.2 C)  98.7 F (37.1 C)   TempSrc: Oral  Oral   SpO2: 93%  93%   Weight:      Height:        Intake/Output Summary (Last 24 hours) at 08/20/2022 1031 Last data filed at 08/20/2022 0612 Gross per 24 hour  Intake 348 ml  Output 1725 ml   Net -1377 ml     Wt Readings from Last 3 Encounters:  08/18/22 130.2 kg  08/08/22 131.1 kg  07/19/22 127.9 kg   Physical Exam General: Alert and oriented x 3, NAD Cardiovascular: S1 S2 clear, RRR.  Respiratory: Bibasilar crackles with rhonchi bilaterally Gastrointestinal: Soft, nontender, nondistended, NBS Ext: 1+ pedal edema bilaterally (per patient chronic) Neuro: no new deficits Psych: Normal affect and demeanor  Data Reviewed:  I have personally reviewed following labs    CBC Lab Results  Component Value Date   WBC 4.1 08/20/2022   RBC 5.23 08/20/2022   HGB 16.6 08/20/2022   HCT 51.4 08/20/2022   MCV 98.3 08/20/2022   MCH 31.7 08/20/2022   PLT 149 (L) 08/20/2022   MCHC 32.3 08/20/2022   RDW 13.1 08/20/2022   LYMPHSABS 0.5 (L) 08/20/2022   MONOABS 0.7 08/20/2022   EOSABS 0.1 08/20/2022   BASOSABS 0.0 60/07/9322     Last metabolic panel Lab Results  Component Value Date   NA 137 08/20/2022   K 3.6 08/20/2022   CL 104 08/20/2022   CO2 22 08/20/2022   BUN 8 08/20/2022   CREATININE 0.65 08/20/2022   GLUCOSE 107 (H) 08/20/2022   GFRNONAA >60 08/20/2022   GFRAA >60 07/24/2017   CALCIUM 8.2 (L) 08/20/2022   PROT 5.9 (L) 08/20/2022   ALBUMIN 3.3 (L) 08/20/2022   BILITOT 0.9 08/20/2022   ALKPHOS 63 08/20/2022   AST 25 08/20/2022   ALT 23 08/20/2022   ANIONGAP 11 08/20/2022    CBG (last 3)  Recent Labs    08/19/22 1637 08/19/22 2202 08/20/22 0749  GLUCAP 153* 176* 136*      Coagulation Profile: Recent Labs  Lab 08/18/22 1728 08/19/22 0409 08/20/22 0352  INR 2.4* 2.6* 2.2*     Radiology Studies: I have personally reviewed the imaging studies  ECHOCARDIOGRAM COMPLETE  Result Date: 08/19/2022    ECHOCARDIOGRAM REPORT   Patient Name:   MAK BONNY Date of Exam: 08/19/2022 Medical Rec #:  [id number]        Height:       75.0 in Accession #:    [id number]       Weight:       287.0 lb Date of Birth:  07-05-1961         BSA:           2.558 m Patient Age:    85 years         BP:           164/75 mmHg Patient Gender: M                HR:           71 bpm. Exam Location:  Inpatient Procedure: 2D Echo and Intracardiac Opacification Agent Indications:    CHF  History:        Patient has no prior history of Echocardiogram examinations.                 Risk Factors:Hypertension, Diabetes and Dyslipidemia.  Sonographer:    Harvie Junior Referring Phys: [phone] Ezabella Teska K Jadyn Barge  Sonographer Comments: Technically difficult study due to poor echo windows and patient is obese. Image acquisition challenging due to patient body habitus and Image acquisition challenging due to respiratory motion. IMPRESSIONS  1. Left ventricular ejection fraction, by estimation, is 60 to 65%. The left ventricle has normal function. The left ventricle has no regional wall motion abnormalities. Left ventricular diastolic parameters were normal.  2. Right ventricular systolic function is normal. The right ventricular size is mildly enlarged. Tricuspid regurgitation signal is inadequate for assessing PA pressure.  3. No valvular stenoses or regurgitation noted. FINDINGS  Left Ventricle: Left ventricular ejection fraction, by estimation, is 60 to 65%. The left ventricle has normal function. The left ventricle has no regional wall motion abnormalities. The left ventricular internal cavity size was normal in  size. There is  no left ventricular hypertrophy. Left ventricular diastolic parameters were normal. Right Ventricle: The right ventricular size is mildly enlarged. Right vetricular wall thickness was not well visualized. Right ventricular systolic function is normal. Tricuspid regurgitation signal is inadequate for assessing PA pressure. The tricuspid regurgitant velocity is 1.36 m/s, and with an assumed right atrial pressure of 3 mmHg, the estimated right ventricular systolic pressure is 68.3 mmHg. Left Atrium: Left atrial size was normal in size. Right Atrium: Right atrial size was  normal in size. Pericardium: There is no evidence of pericardial effusion. Mitral Valve: The mitral valve is grossly normal. No evidence of mitral valve regurgitation. No evidence of mitral valve stenosis. Tricuspid Valve: The tricuspid valve is not well visualized. Tricuspid valve regurgitation is not demonstrated. No evidence of tricuspid stenosis. Aortic Valve: The aortic valve was not well visualized. Aortic valve regurgitation is not visualized. No aortic stenosis is present. Aortic valve mean gradient measures 7.5 mmHg. Aortic valve peak gradient measures 13.4 mmHg. Aortic valve area, by VTI measures 2.97 cm. Pulmonic Valve: The pulmonic valve was not well visualized. Pulmonic valve regurgitation is not visualized. No evidence of pulmonic stenosis. Aorta: The aortic root is normal in size and structure. Venous: The inferior vena cava was not well visualized. IAS/Shunts: No atrial level shunt detected by color flow Doppler.  LEFT VENTRICLE PLAX 2D LVIDd:         4.20 cm      Diastology LVIDs:         2.90 cm      LV e' medial:    7.40 cm/s LV PW:         1.10 cm      LV E/e' medial:  11.2 LV IVS:        1.10 cm      LV e' lateral:   14.80 cm/s LVOT diam:     2.30 cm      LV E/e' lateral: 5.6 LV SV:         108 LV SV Index:   42 LVOT Area:     4.15 cm  LV Volumes (MOD) LV vol d, MOD A2C: 140.0 ml LV vol d, MOD A4C: 128.0 ml LV vol s, MOD A2C: 71.6 ml LV vol s, MOD A4C: 50.4 ml LV SV MOD A2C:     68.4 ml LV SV MOD A4C:     128.0 ml LV SV MOD BP:      74.4 ml RIGHT VENTRICLE RV Basal diam:  4.40 cm RV Mid diam:    4.20 cm RV S prime:     17.30 cm/s TAPSE (M-mode): 3.1 cm LEFT ATRIUM           Index        RIGHT ATRIUM           Index LA diam:      3.50 cm 1.37 cm/m   RA Area:     17.90 cm LA Vol (A4C): 44.8 ml 17.51 ml/m  RA Volume:   52.30 ml  20.44 ml/m  AORTIC VALVE                     PULMONIC VALVE AV Area (Vmax):    2.47 cm      PV Vmax:       0.90 m/s AV Area (Vmean):   2.40 cm      PV Peak grad:   3.2 mmHg AV Area (VTI):  2.97 cm AV Vmax:           183.00 cm/s AV Vmean:          127.000 cm/s AV VTI:            0.362 m AV Peak Grad:      13.4 mmHg AV Mean Grad:      7.5 mmHg LVOT Vmax:         109.00 cm/s LVOT Vmean:        73.300 cm/s LVOT VTI:          0.259 m LVOT/AV VTI ratio: 0.72  AORTA Ao Root diam: 3.90 cm Ao Asc diam:  3.80 cm MITRAL VALVE               TRICUSPID VALVE MV Area (PHT): 3.31 cm    TR Peak grad:   7.4 mmHg MV Decel Time: 229 msec    TR Vmax:        136.00 cm/s MR Peak grad: 8.6 mmHg MR Vmax:      147.00 cm/s  SHUNTS MV E velocity: 82.70 cm/s  Systemic VTI:  0.26 m MV A velocity: 72.40 cm/s  Systemic Diam: 2.30 cm MV E/A ratio:  1.14 Vishnu Priya Mallipeddi Electronically signed by Lorelee Cover Mallipeddi Signature Date/Time: 08/19/2022/1:22:47 PM    Final    MR ABDOMEN W WO CONTRAST  Result Date: 08/19/2022 CLINICAL DATA:  Hepatic mass on recent CT.  Chronic hepatitis-C. EXAM: MRI ABDOMEN WITHOUT AND WITH CONTRAST TECHNIQUE: Multiplanar multisequence MR imaging of the abdomen was performed both before and after the administration of intravenous contrast. CONTRAST:  44m GADAVIST GADOBUTROL 1 MMOL/ML IV SOLN COMPARISON:  CT on 08/18/2022 FINDINGS: Lower chest: Bilateral lower lobe infiltrates show no significant change compared to recent CT. Hepatobiliary: Marked caudate lobe hypertrophy is suspicious for cirrhosis. Numerous masses are seen throughout the right hepatic lobe which show arterial phase hyperenhancement. Largest of these in segment 8 measures 7.7 x 5.7 cm and shows central areas of necrosis. The other hypervascular masses are smaller, ranging in size from less than 1 cm to 3.6 cm. These findings are highly suspicious for multifocal hepatocellular carcinoma. Gallbladder is unremarkable. No evidence of biliary ductal dilatation. Pancreas:  No mass or inflammatory changes. Spleen:  Within normal limits in size and appearance. Adrenals/Urinary Tract: No suspicious masses  identified. Several benign Bosniak category 1 and 2 renal cysts are seen (no followup imaging is recommended) . No evidence of hydronephrosis. Stomach/Bowel: Unremarkable. Vascular/Lymphatic: Mild lymphadenopathy is seen in the gastrohepatic ligament, porta hepatis, and retroperitoneum, with largest index lymph node in the gastrohepatic ligament measuring 1.6 cm short axis. No acute vascular findings. Other:  None. Musculoskeletal:  No suspicious bone lesions identified. IMPRESSION: Marked caudate lobe hypertrophy, highly suspicious for hepatic cirrhosis. Numerous hypervascular masses throughout the right hepatic lobe, highly suspicious for multifocal hepatocellular carcinoma. Recommend correlation with AFP and consider tissue sampling. Mild abdominal lymphadenopathy, with differential diagnosis including metastatic disease and reactive lymphadenopathy in setting of cirrhosis. Bilateral lower lobe infiltrates, as better demonstrated on recent CT. Electronically Signed   By: JMarlaine HindM.D.   On: 08/19/2022 12:54   CT Abdomen Pelvis W Contrast  Result Date: 08/18/2022 CLINICAL DATA:  Acute generalized abdominal pain. EXAM: CT ABDOMEN AND PELVIS WITH CONTRAST TECHNIQUE: Multidetector CT imaging of the abdomen and pelvis was performed using the standard protocol following bolus administration of intravenous contrast. RADIATION DOSE REDUCTION: This exam was performed according to the departmental dose-optimization program which  includes automated exposure control, adjustment of the mA and/or kV according to patient size and/or use of iterative reconstruction technique. CONTRAST:  123m OMNIPAQUE IOHEXOL 300 MG/ML  SOLN COMPARISON:  August 16, 2022.  October 13, 2018. FINDINGS: Lower chest: Mild bibasilar opacities are noted concerning for atelectasis, infiltrates or possibly scarring. Hepatobiliary: No gallstones or biliary dilatation is noted. There is interval development a 6.5 x 5.2 cm heterogeneously  enhancing mass in the right hepatic lobe which was not present on the prior MRI exam of 2019. This is highly concerning for neoplasm or malignancy. Pancreas: Unremarkable. No pancreatic ductal dilatation or surrounding inflammatory changes. Spleen: Normal in size without focal abnormality. Adrenals/Urinary Tract: Adrenal glands appear normal. Grossly stable multi-septated cystic lesion is seen involving the upper pole the right kidney with peripheral calcifications. No hydronephrosis or renal obstruction is noted. Urinary bladder is unremarkable. Stomach/Bowel: Stomach is within normal limits. Appendix appears normal. No evidence of bowel wall thickening, distention, or inflammatory changes. Vascular/Lymphatic: Aortic atherosclerosis. Mildly enlarged periaortic adenopathy is noted with largest lymph node measuring 12 mm. Mildly enlarged adenopathy is also noted in the gastrohepatic ligament, but this is unchanged compared to prior exam. Reproductive: Prostate is unremarkable. Other: No abdominal wall hernia or abnormality. No abdominopelvic ascites. Musculoskeletal: Status post left total hip arthroplasty. No acute osseous abnormality is noted. IMPRESSION: Interval development of 6.5 x 5.2 cm heterogeneously enhancing mass in right hepatic lobe. This is highly concerning for neoplasm or malignancy, and further evaluation with MRI is recommended. Mildly enlarged periaortic adenopathy is noted, with the largest lymph node measuring 12 mm. Metastatic disease cannot be excluded. Mildly enlarged adenopathy is also noted in the gastrohepatic ligament, but this is unchanged compared to prior exam. Stable bibasilar lung opacities are noted concerning for inflammation, atelectasis or possibly scarring. Grossly stable multi-septated cystic lesion is seen involving upper pole of right kidney with peripheral calcifications compared to prior exam of 2019, most likely representing benign etiology. Aortic Atherosclerosis  (ICD10-I70.0). Electronically Signed   By: JMarijo ConceptionM.D.   On: 08/18/2022 18:18       Serge Main M.D. [facility] 08/20/2022, 10:31 AM  Available via Epic secure chat 7am-7pm After 7 pm, please refer to night coverage provider listed on amion.

## 2022-08-21 ENCOUNTER — Telehealth: Payer: Self-pay | Admitting: Pulmonary Disease

## 2022-08-21 ENCOUNTER — Other Ambulatory Visit (HOSPITAL_COMMUNITY): Payer: Self-pay

## 2022-08-21 DIAGNOSIS — I82411 Acute embolism and thrombosis of right femoral vein: Secondary | ICD-10-CM | POA: Diagnosis not present

## 2022-08-21 DIAGNOSIS — J189 Pneumonia, unspecified organism: Secondary | ICD-10-CM | POA: Diagnosis not present

## 2022-08-21 DIAGNOSIS — I10 Essential (primary) hypertension: Secondary | ICD-10-CM | POA: Diagnosis not present

## 2022-08-21 DIAGNOSIS — Z8619 Personal history of other infectious and parasitic diseases: Secondary | ICD-10-CM

## 2022-08-21 DIAGNOSIS — E119 Type 2 diabetes mellitus without complications: Secondary | ICD-10-CM | POA: Diagnosis not present

## 2022-08-21 LAB — CBC
HCT: 49.2 % (ref 39.0–52.0)
Hemoglobin: 16.5 g/dL (ref 13.0–17.0)
MCH: 31.7 pg (ref 26.0–34.0)
MCHC: 33.5 g/dL (ref 30.0–36.0)
MCV: 94.4 fL (ref 80.0–100.0)
Platelets: 173 10*3/uL (ref 150–400)
RBC: 5.21 MIL/uL (ref 4.22–5.81)
RDW: 13.1 % (ref 11.5–15.5)
WBC: 4.6 10*3/uL (ref 4.0–10.5)
nRBC: 0 % (ref 0.0–0.2)

## 2022-08-21 LAB — AFP TUMOR MARKER: AFP, Serum, Tumor Marker: 4.1 ng/mL (ref 0.0–8.4)

## 2022-08-21 LAB — COMPREHENSIVE METABOLIC PANEL
ALT: 27 U/L (ref 0–44)
AST: 29 U/L (ref 15–41)
Albumin: 3.4 g/dL — ABNORMAL LOW (ref 3.5–5.0)
Alkaline Phosphatase: 64 U/L (ref 38–126)
Anion gap: 8 (ref 5–15)
BUN: 6 mg/dL — ABNORMAL LOW (ref 8–23)
CO2: 24 mmol/L (ref 22–32)
Calcium: 8.4 mg/dL — ABNORMAL LOW (ref 8.9–10.3)
Chloride: 103 mmol/L (ref 98–111)
Creatinine, Ser: 0.69 mg/dL (ref 0.61–1.24)
GFR, Estimated: >60 mL/min (ref >60)
Glucose, Bld: 132 mg/dL — ABNORMAL HIGH (ref 70–99)
Potassium: 4 mmol/L (ref 3.5–5.1)
Sodium: 135 mmol/L (ref 135–145)
Total Bilirubin: 1.2 mg/dL (ref 0.3–1.2)
Total Protein: 6.1 g/dL — ABNORMAL LOW (ref 6.5–8.1)

## 2022-08-21 LAB — GLUCOSE, CAPILLARY
Glucose-Capillary: 125 mg/dL — ABNORMAL HIGH (ref 70–99)
Glucose-Capillary: 128 mg/dL — ABNORMAL HIGH (ref 70–99)

## 2022-08-21 LAB — PROTIME-INR
INR: 1.4 — ABNORMAL HIGH (ref 0.8–1.2)
Prothrombin Time: 16.6 seconds — ABNORMAL HIGH (ref 11.4–15.2)

## 2022-08-21 LAB — HEPARIN LEVEL (UNFRACTIONATED): Heparin Unfractionated: 0.67 IU/mL (ref 0.30–0.70)

## 2022-08-21 MED ORDER — CEFPODOXIME PROXETIL 200 MG PO TABS
200.0000 mg | ORAL_TABLET | Freq: Two times a day (BID) | ORAL | 0 refills | Status: AC
  Filled 2022-08-21: qty 6, 3d supply, fill #0

## 2022-08-21 MED ORDER — ENOXAPARIN SODIUM 120 MG/0.8ML IJ SOSY
120.0000 mg | PREFILLED_SYRINGE | Freq: Two times a day (BID) | INTRAMUSCULAR | Status: DC
  Administered 2022-08-21: 120 mg via SUBCUTANEOUS
  Filled 2022-08-21: qty 0.8

## 2022-08-21 MED ORDER — AZITHROMYCIN 500 MG PO TABS
500.0000 mg | ORAL_TABLET | Freq: Every day | ORAL | 0 refills | Status: AC
  Filled 2022-08-21: qty 2, 2d supply, fill #0

## 2022-08-21 MED ORDER — ENOXAPARIN SODIUM 120 MG/0.8ML IJ SOSY
120.0000 mg | PREFILLED_SYRINGE | Freq: Two times a day (BID) | INTRAMUSCULAR | 0 refills | Status: DC
  Filled 2022-08-21: qty 11.2, 7d supply, fill #0

## 2022-08-21 MED ORDER — ENOXAPARIN SODIUM 150 MG/ML IJ SOSY
130.0000 mg | PREFILLED_SYRINGE | Freq: Two times a day (BID) | INTRAMUSCULAR | 0 refills | Status: DC
  Filled 2022-08-21: qty 13, 8d supply, fill #0

## 2022-08-21 MED ORDER — BENZONATATE 200 MG PO CAPS
200.0000 mg | ORAL_CAPSULE | Freq: Three times a day (TID) | ORAL | 3 refills | Status: DC | PRN
  Filled 2022-08-21: qty 30, 10d supply, fill #0

## 2022-08-21 MED ORDER — ENOXAPARIN SODIUM 150 MG/ML IJ SOSY
130.0000 mg | PREFILLED_SYRINGE | Freq: Two times a day (BID) | INTRAMUSCULAR | Status: DC
  Filled 2022-08-21: qty 0.86

## 2022-08-21 MED ORDER — AZITHROMYCIN 250 MG PO TABS: 500.0000 mg | ORAL_TABLET | Freq: Every day | ORAL | Status: DC

## 2022-08-21 MED ORDER — WARFARIN SODIUM 5 MG PO TABS: 7.5000 mg | ORAL_TABLET | Freq: Once | ORAL | Status: DC

## 2022-08-21 NOTE — TOC Transition Note (Signed)
Transition of Care [facility]) - CM/SW Discharge Note   Patient Details  Name: Christopher Marsh MRN: [id number] Date of Birth: Nov 09, 1960  [facility] CM/SW Contact:  Leeroy Cha, RN Phone Number: 08/21/2022, 11:08 AM   Clinical Narrative:    [id number] discharged to return home.  Chart reviewed for TOC needs.  None found.  Patient self care.   Final next level of care: Home/Self Care Barriers to Discharge: Barriers Resolved   Patient Goals and CMS Choice Patient states their goals for this hospitalization and ongoing recovery are:: to go home CMS Medicare.gov Compare Post Acute Care list provided to:: Patient    Discharge Placement                       Discharge Plan and Services   Discharge Planning Services: CM Consult                                 Social Determinants of Health (SDOH) Interventions     Readmission Risk Interventions   No data to display

## 2022-08-21 NOTE — Progress Notes (Addendum)
[place] for Warfarin with IV heparin bridge Indication: hx of recurrent DVT, positive lupus anticoagulant  No Known Allergies  Patient Measurements: Height: '6\' 3"'$  (190.5 cm) Weight: 130.2 kg (287 lb) IBW/kg (Calculated) : 84.5 Heparin Dosing Weight: 113 kg  Vital Signs: Temp: 98.6 F (37 C) (10/24 0532) Temp Source: Oral (10/24 0532) BP: 151/73 (10/24 0532) Pulse Rate: 64 (10/24 0532)  Labs: Recent Labs    08/18/22 1732 08/18/22 2134 08/19/22 0409 08/20/22 0352 08/20/22 1116 08/20/22 2137 08/21/22 0756  HGB  --   --  15.9 16.6  --   --  16.5  HCT  --   --  47.8 51.4  --   --  49.2  PLT  --   --  179 149*  --   --  173  LABPROT  --   --  27.5* 23.9* 19.9*  --  16.6*  INR  --   --  2.6* 2.2* 1.7*  --  1.4*  HEPARINUNFRC  --   --   --   --   --  0.25* 0.67  CREATININE  --   --  0.80 0.65  --   --  0.69  TROPONINIHS 8 9  --   --   --   --   --      Estimated Creatinine Clearance: 137.4 mL/min (by C-G formula based on SCr of 0.69 mg/dL).   Medical History: Past Medical History:  Diagnosis Date   Antiphospholipid antibody syndrome ([place])    Arthritis    Diabetes mellitus without complication ([place])    DVT (deep venous thrombosis) ([place])    Lt leg   Dyspnea    Elevated lipids    Erythrocytosis    Hyperlipidemia    Hypertension    Lower extremity edema    Multinodular thyroid    Post-thrombotic syndrome     Medications:  PTA Warfarin 7.'5mg'$  on Sundays and '5mg'$  on all other days of the week - last dose 08/18/22 at 1400  Assessment: 61 y/oM on chronic warfarin therapy PTA for history of recurrent DVT, positive lupus anticoagulant who presented to the ED on 08/18/22 with cough/fatigue after chest CT done outpatient showed findings consistent with multifocal pneumonia as well as multiple new hepatic lesions with enlarging lymph nodes concerning for metastatic disease. CT abd pelvis showed 6.5x 5.2 cm mass in the right hepatic  lobe, highly concerning for neoplasm or malignancy.  Warfarin resumed on admission. INR therapeutic at 2.4 on admission.   Significant Events: 08/19/22: Warfarin held for liver biopsy.   08/20/22: Vitamin K '5mg'$  PO x 1 at 0856 given per MD. Repeat INR 1.7. Liver biopsy performed per IR. Warfarin resumed post-procedure with IV heparin bridge.   08/21/2022: Heparin level = 0.67 units/mL, now therapeutic on IV heparin infusion at 2050 units/hr INR = 1.4, subtherapeutic as expected after Vitamin K administration yesterday CBC: WNL No bleeding or infusion issues noted per nursing Drug-drug interactions with warfarin: being on antibiotics (ceftriaxone/azithromycin) can make pt more sensitive to warfarin  Goal of Therapy:  INR 2-3 Heparin level 0.3-0.7 units/mL Monitor platelets by anticoagulation protocol: Yes   Plan:  Continue heparin infusion at 2050 units/hr - continue until INR therapeutic  Heparin level in 6 hours to confirm remains within therapeutic range Warfarin 7.'5mg'$  PO x 1 today Daily PT/INR, heparin level, CBC Monitor closely for s/sx of bleeding   Lindell Spar, PharmD, BCPS Clinical Pharmacist  08/21/2022,8:42 AM   Addendum:  MD  switching bridge from IV heparin to SQ enoxaparin in preparation for discharge.    Plan: Stop IV heparin Start Enoxaparin '120mg'$  SQ q12h until INR therapeutic. Discussed dosing with MD-even though this is slightly less than '1mg'$ /kg q12h dosing, this will provide easier administration for patient since this will be full syringe. RN to educate patient prior to discharge on appropriate Enoxaparin administration technique.    Lindell Spar, PharmD, BCPS Clinical Pharmacist 08/21/2022 11:56 AM

## 2022-08-21 NOTE — Progress Notes (Signed)
Pt discharged to home at this time. Prior to DC, IV and tele was removed. Pt was given DC paperwork regarding medications, appointments, and condition. Pt verbalized understanding and stated no other concerns at this time. Pt stable at time of DC and left in personal vehicle driven by wife.

## 2022-08-21 NOTE — Progress Notes (Signed)
NAME:  Christopher Marsh, MRN:  [id number], DOB:  [birth year], LOS: 1 ADMISSION DATE:  08/18/2022, CONSULTATION DATE:  08/19/2022 REFERRING MD: Billy Fischer - EDP CHIEF COMPLAINT:  Pneumonia  History of Present Illness:  61 year old man who presented to [facility] ED 10/21 with concern for PNA. PMHx significant for  HTN, HLD, APLA syndrome with chronic DVT of BLE (on Coumadin), erythrocytosis, possible OSA, remote history of smoking, T2DM.  Seen in the ED 06/2022 for headache, cough, dyspnea, and fatigue. Found to have LLL CAP, treated with Augmentin and Zithromax.  Presented to his PCP 08/08/22 and reported symptomatic improvement but with residual non-productive cough.  Noted to still have abnormal lung sounds and concern for emphysema. CT Chest 10/21 demonstrated diffuse airspace disease in LLL and some aspects of LUL, RUL, RLL concerning for multifocal pneumonia; also found to have new 6.5 x 5.2 cm mass in the R hepatic lobe concerning for malignancy.  Also with 20lb weight loss over the last few months PTA (some of this was intentional due to dietary changes); admits alcohol use at night/some [facility] use as a sleep aid. Advised by his PCP to come to the hospital for admission.    PCCM consulted to assist with assessment of persistent pulmonary air space disease.    Pertinent Medical History:  OA, DM type 2, recurrent DVT, HLD, HTN, Antiphospholipid Ab syndrome, Erythrocytosis, COVID 16 August 2020, Multinodular thyroid  Significant Hospital Events: Including procedures, antibiotic start and stop dates in addition to other pertinent events   10/21 Admit, start Abx 10/22 PCCM consulted for assistance with management of multifocal PNA 10/23 Underwent IR liver biopsy of new hepatic mass/lesion, path pending. CXR generally improved, no indication for bronchoscopy. 10/24 Overall clinically improved, lung exam improved. HCV Ab reactive, HCV RNA pending. PCCM to sign off.  Interim History / Subjective:  No  significant events overnight Up to EOB, eating breakfast, in good spirits Wife at bedside Feeling overall much better, "best I've felt in a long time" Still some productive cough, overall decreased Day 4/5 of CAP treatment, complete 10/25 IR liver biopsy path pending Ok to d/c from pulmonary perspective  Objective:  Blood pressure (!) 151/73, pulse 64, temperature 98.6 F (37 C), temperature source Oral, resp. rate 16, height '6\' 3"'$  (1.905 m), weight 130.2 kg, SpO2 92 %.        Intake/Output Summary (Last 24 hours) at 08/21/2022 0836 Last data filed at 08/21/2022 0526 Gross per 24 hour  Intake 411.35 ml  Output 400 ml  Net 11.35 ml    Filed Weights   08/18/22 1341  Weight: 130.2 kg   Physical Examination: General: Overall well-appearing middle-aged man in NAD. Pleasant and conversant, sitting at bedside eating breakfast. HEENT: [place]/AT, anicteric sclera, PERRL, moist mucous membranes. Neuro: Awake, oriented x 4. Responds to verbal stimuli. Following commands consistently. Moves all 4 extremities spontaneously. Strength 5/5 in all 4 extremities.  CV: RRR, no m/g/r. PULM: Breathing even and unlabored on RA. Lung fields clear on R, coarse rhonchi to LLL, otherwise clear on L. GI: Obese, soft, nontender, nondistended. Extremities: Bilateral chronic 2+ nonpitting LE edema noted. Skin: Warm/dry, +venous stasis changes bilaterally.  Resolved Hospital Problem List:     Assessment & Plan:   Persistent pulmonary infiltrates Radiographic appearance is suggestive of recurrent pneumonia although his symptom constellation is minimal at this time - Continue CAP treatment course with ceftriaxone/azithromcyin (end 10/25) - RVP negative - BCx remain NGTD, f/u finalized Cx - Repeat CXR stable to improved -  Outpatient f/u with pulmonary will be scheduled for additional surveillance, will also need sleep evaluation  Erythrocytosis ?OSA Concern for possible sleep apnea. Did not tolerate  CPAP while in the hospital. - Outpatient sleep study - May benefit from further CPAP trial  Insomnia Discussed concern about using alcohol as a sedative agent to help with sleep. - Suspect some degree of OSA given report of poor sleep, snoring - Recommend outpatient sleep evaluation  Liver mass CT A/P 10/21 with interval development of 6.5 x 5.2cm heterogenously enhancing mass in R hepatic lobe, c/f neoplasm or malignancy. MRI Abdomen 10/22 with marked caudate lobe hypertrophy, highly suspicious for hepatic cirrhosis; numerous hypervascular masses throughout the R hepatic lobe, c/f multifocal HCC. - Hep B core antibody negative - Hep C antibody reactive, follow up HCV RNA/Quant pending - AFP 4.1, mid-range - S/p IR liver biopsy, path pending  Recurrent DVT with Antiphospholipid syndrome and post-thrombotic syndrome Followed by Dr. Earlie Server with hematology as an outpatient. - AC per primary team - Monitoring INR  Leukopenia, improved - Trend CBC  Best Practice (right click and "Reselect all SmartList Selections" daily)   Per Primary Team  Signature:   Rhae Lerner [facility] 08/21/22 8:36 AM  Please see [email or web address] for pager details.  From 7A-7P if no response, please call [phone] After hours, please call ELink [phone]

## 2022-08-21 NOTE — Progress Notes (Signed)
Mobility Specialist - Progress Note   08/21/22 1113  Mobility  Activity Ambulated with assistance in hallway  Level of Assistance Independent after set-up  Assistive Device  (IV Pole)  Distance Ambulated (ft) 700 ft  Activity Response Tolerated well  Mobility Referral Yes  $Mobility charge 1 Mobility   Pt received in bed and agreeable to mobility. No complaints during session. Pt to bed after session with all needs met & wife in room.   [facility]

## 2022-08-21 NOTE — Discharge Summary (Signed)
Physician Discharge Summary   Patient: Christopher Marsh MRN: [id number] DOB: [birth year]  Admit date:     08/18/2022  Discharge date: 08/21/22  Discharge Physician: Estill Cotta, MD    PCP: Laurey Morale, MD   Recommendations at discharge:   INR today 1.4, started on enoxaparin 120 mg SQ every 12 hours until INR therapeutic between 2 and 3. Recommended Coumadin 7.5 mg today and tomorrow 08/22/2022 Please check INR on 08/23/2022 and adjust Coumadin dose Dr. Arvid Right to follow liver biopsy results and further management (established patient of Dr. Tasia Catchings)  Discharge Diagnoses:    Multifocal pneumonia     Hepatic lesion, liver mass right lobe   Hyperlipemia   Venous (peripheral) insufficiency   ESOPHAGITIS, REFLUX   History of hepatitis C   Morbid obesity (HCC)   DM2 (diabetes mellitus, type 2) (HCC)   Essential hypertension   DVT of deep femoral vein, right [facility])     Hospital Course: Patient is a 61 year old male with hyperlipidemia, obesity, venous insufficiency, lymphedema, esophagitis, hepatitis C, diabetes mellitus, recurrent DVTs presented with cough and shortness of breath.  Patient reported cough for the past 4 to 5 months with shortness of breath.  He did have an episode in September this year when he was seen at urgent care with fevers, productive cough and shortness of breath.  He was diagnosed with LLL pneumonia and prescribed Augmentin and a Zithromax.  He did have some improvement but continued to have chronic cough. Outpatient CT of the chest was ordered which showed multifocal pneumonia and liver masses. Patient presented to ED for further evaluation. SBP 150s to 160s.  Hemoglobin 18.  INR 2.4.  Troponin normal.  BNP 102. CT abdomen pelvis showed new mass in the right hepatic lobe concerning for malignancy.  Periaortic lymphadenopathy noted.  Stable basilar opacities representing infection/inflammation versus atelectasis versus scarring. Pulmonology was  consulted. Admitted for further work-up    Assessment and Plan:    Multifocal pneumonia with shortness of breath -Presented with persistent cough, shortness of breath, CT chest 10/19 showed multifocal airspace disease in both lungs left greater than right compatible with multifocal pneumonia.  Patient also reported dyspnea on exertion, fatigue worsening in the last 1 year -Procalcitonin <0.1 -Influenza panel negative, COVID-negative, HIV negative -2D echo showed EF of 60 to 65%, no regional wall motion abnormalities. -Repeat chest x-ray improved, no indication for bronchoscopy with clinical improvement. -Complete course of antibiotics with Zithromax, Vantin for 2 more days. -Out patient follow-up with pulmonology and outpatient sleep study recommended   Active Problems:   Hepatic lesion, underlying history of ?hepatitis C -CT abd pelvis showed 6.5x 5.2 cm mass in the right hepatic lobe, ?neoplasm.   -Family history of hepatitis C (brother),, had 63-monthcourse of interferon, per patient was prophylactic.  -LFTs normal, no abdominal pain.  -MRCP showed marked caudate lobe hypertrophy, highly suspicious for hepatic cirrhosis, numerous hypervascular masses throughout the right hepatic lobe suspicious for multifocal HCC.  Mild abdominal lymph neuropathy.  Bilateral lower lobe infiltrates -AFP within normal limits -GI, IR was consulted, underwent liver biopsy on 08/20/2022.   -Patient is established with Dr. YTasia Catchingsoncology, will follow liver biopsy results. (This was discussed with Dr. SAmmie Dalton on-call oncology, arranging appointment with Dr. YTasia Catchings -Hep C antibody positive, HCV RNA pending   Recurrent DVTs, in the setting of positive lupus anticoagulant -Received vitamin K 5 mg to reverse INR for liver biopsy -Today INR 1.4, placed on Lovenox bridge until INR therapeutic, continue  Coumadin     Hyperlipemia -LFTs normal, continue statin   GERD, history of esophagitis gastritis -Not on PPI  outpatient        DM2 (diabetes mellitus, type 2) ([place]), NIDDM, controlled -Continue new oral hypoglycemics -HbA1c 5.8     Essential hypertension -BP stable, continue amlodipine    Obesity, venous insufficiency Estimated body mass index is 35.87 kg/m as calculated from the following:   Height as of this encounter: '6\' 3"'$  (1.905 m).   Weight as of this encounter: 130.2 kg.        Pain control - [employer] Controlled Substance Reporting System database was reviewed. and patient was instructed, not to drive, operate heavy machinery, perform activities at heights, swimming or participation in water activities or provide baby-sitting services while on Pain, Sleep and Anxiety Medications; until their outpatient Physician has advised to do so again. Also recommended to not to take more than prescribed Pain, Sleep and Anxiety Medications.  Consultants: Pulmonology, IR, GI, oncology Procedures performed: Liver biopsy Disposition: Home Diet recommendation:  Discharge Diet Orders (From admission, onward)     Start     Ordered   08/21/22 0000  Diet - low sodium heart healthy        08/21/22 1102           Carb modified diet DISCHARGE MEDICATION: Allergies as of 08/21/2022   No Known Allergies      Medication List     STOP taking these medications    ketoconazole 2 % cream Commonly known as: NIZORAL   promethazine-dextromethorphan 6.25-15 MG/5ML syrup Commonly known as: PROMETHAZINE-DM       TAKE these medications    albuterol 108 (90 Base) MCG/ACT inhaler Commonly known as: VENTOLIN HFA Inhale 2 puffs into the lungs every 4 (four) hours as needed. What changed: reasons to take this   amLODipine 5 MG tablet Commonly known as: NORVASC Take 1 tablet (5 mg total) by mouth daily.   azithromycin 500 MG tablet Commonly known as: Zithromax Take 1 tablet (500 mg total) by mouth daily for 2 days.   benzonatate 200 MG capsule Commonly known as: TESSALON Take 1  capsule (200 mg total) by mouth 3 (three) times daily as needed for cough. What changed:  medication strength how much to take when to take this reasons to take this   cefpodoxime 200 MG tablet Commonly known as: VANTIN Take 1 tablet (200 mg total) by mouth 2 (two) times daily for 3 days.   enoxaparin 120 MG/0.8ML injection Commonly known as: LOVENOX Inject 0.8 mLs (120 mg total) into the skin every 12 (twelve) hours for 7 days.   glipiZIDE 10 MG tablet Commonly known as: GLUCOTROL Take 1 tablet (10 mg total) by mouth 2 (two) times daily before a meal. What changed: when to take this   glucose blood test strip 1 each by Other route as needed for other. accu chek aviva plusUse as instructed   magnesium oxide 400 (240 Mg) MG tablet Commonly known as: MAG-OX Take 400 mg by mouth in the morning.   metFORMIN 500 MG tablet Commonly known as: GLUCOPHAGE TAKE TWO TABLETS BY MOUTH EVERY MORNING WITH BREAKFAST What changed:  how much to take how to take this when to take this additional instructions   pravastatin 20 MG tablet Commonly known as: PRAVACHOL Take 1 tablet (20 mg total) by mouth daily.   tadalafil 20 MG tablet Commonly known as: CIALIS Take 0.5-1 tablets (10-20 mg total) by mouth every  other day as needed for erectile dysfunction.   triamcinolone cream 0.1 % Commonly known as: KENALOG Apply 1 Application topically 3 (three) times daily. What changed:  when to take this reasons to take this   warfarin 5 MG tablet Commonly known as: COUMADIN Take as directed. If you are unsure how to take this medication, talk to your nurse or doctor. Original instructions: TAKE 1 TABLET BY MOUTH DAILY EXCEPT TAKE 1 1/2 TABLETS ON SUNDAYS, WEDNESDAYS, AND FRIDAYS OR AS DIRECTED BY ANTICOAGULATION CLINIC What changed:  how much to take how to take this when to take this additional instructions        Follow-up Information     Earlie Server, MD. Schedule an appointment as  soon as possible for a visit in 1 week(s).   Specialty: Oncology Why: for hospital follow-up Contact information: [place] [address] [phone]         Laurey Morale, MD. Schedule an appointment as soon as possible for a visit in 1 week(s).   Specialty: Family Medicine Why: for hospital follow-up. Need PT/INR check on thursday, 08/23/22 Contact information: [place] [address] [phone]         Chesley Mires, MD. Schedule an appointment as soon as possible for a visit in 2 week(s).   Specialty: Pulmonary Disease Why: for hospital follow-up Contact information: [place]  [address] [phone]                Discharge Exam: Danley Danker Weights   08/18/22 1341 08/21/22 0953  Weight: 130.2 kg 130.2 kg   S: No acute complaints, hoping to go home today.  No abdominal pain.  Wife at the bedside  Vitals:   08/21/22 0532 08/21/22 0846 08/21/22 0953 08/21/22 1248  BP: (!) 151/73 (!) 158/73 (!) 148/68 (!) 143/74  Pulse: 64  87 67  Resp: '16  17 18  '$ Temp: 98.6 F (37 C)  98.5 F (36.9 C) 98.3 F (36.8 C)  TempSrc: Oral  Oral Oral  SpO2: 92%  92% 95%  Weight:   130.2 kg   Height:   '6\' 3"'$  (1.905 m)     Physical Exam General: Alert and oriented x 3, NAD Cardiovascular: S1 S2 clear, RRR.  Respiratory: CTAB, no wheezing Gastrointestinal: Soft, nontender, nondistended, NBS Ext: neck venous insufficiency and pedal edema Psych: Normal affect and demeanor, alert and oriented x3    Condition at discharge: fair  The results of significant diagnostics from this hospitalization (including imaging, microbiology, ancillary and laboratory) are listed below for reference.   Imaging Studies: US BIOPSY (LIVER)  Result Date: 08/20/2022 INDICATION: Multifocal liver masses EXAM: ULTRASOUND GUIDED LIVER LESION BIOPSY COMPARISON:  MR abdomen, 08/19/2022.  CT AP, 08/18/2022. MEDICATIONS: None  ANESTHESIA/SEDATION: Moderate (conscious) sedation was employed during this procedure. A total of Versed 4 mg and Fentanyl 100 mcg was administered intravenously. Moderate Sedation Time: 21 minutes. The patient's level of consciousness and vital signs were monitored continuously by radiology nursing throughout the procedure under my direct supervision. COMPLICATIONS: None immediate. PROCEDURE: Informed written consent was obtained from the patient and/or patient's representative after a discussion of the risks, benefits and alternatives to treatment. The patient understands and consents the procedure. A timeout was performed prior to the initiation of the procedure. Ultrasound scanning was performed of the right upper abdominal quadrant demonstrates large RIGHT hepatic lobe mass The RIGHT hepatic lobe mass was selected for biopsy and the procedure was  planned. The right upper abdominal quadrant was prepped and draped in the usual sterile fashion. The overlying soft tissues were anesthetized with 1% lidocaine with epinephrine. A 17 gauge, 6.8 cm co-axial needle was advanced into a peripheral aspect of the lesion. This was followed by 3 core biopsies with an 18 gauge core device under direct ultrasound guidance. The coaxial needle tract was embolized with a small amount of Gel-Foam slurry and superficial hemostasis was obtained with manual compression. Post procedural scanning was negative for definitive area of hemorrhage or additional complication. A dressing was placed. The patient tolerated the procedure well without immediate post procedural complication. IMPRESSION: Successful ultrasound guided core needle biopsy of liver mass, as above. Michaelle Birks, MD [facility] Radiology Electronically Signed   By: Michaelle Birks M.D.   On: 08/20/2022 14:27   DG Chest 2 View  Result Date: 08/20/2022 CLINICAL DATA:  Pneumonia. EXAM: CHEST - 2 VIEW COMPARISON:  July 19, 2022. FINDINGS: The heart size and mediastinal contours are within normal limits. Stable probable bibasilar scarring is noted. No definite acute abnormality is noted. The visualized skeletal structures are unremarkable. IMPRESSION: No definite active cardiopulmonary disease. Electronically Signed   By: Marijo Conception M.D.   On: 08/20/2022 13:46   ECHOCARDIOGRAM COMPLETE  Result Date: 08/19/2022    ECHOCARDIOGRAM REPORT   Patient Name:   Christopher Marsh Date of Exam: 08/19/2022 Medical Rec #:  [id number]        Height:       75.0 in Accession #:    [id number]       Weight:       287.0 lb Date of Birth:  [birth year]         BSA:          2.558 m Patient Age:    7 years         BP:           164/75 mmHg Patient Gender: M                HR:           71 bpm. Exam Location:  Inpatient Procedure: 2D Echo and Intracardiac Opacification Agent Indications:    CHF  History:        Patient has no prior history of Echocardiogram examinations.                 Risk Factors:Hypertension, Diabetes and Dyslipidemia.  Sonographer:    Harvie Junior Referring Phys: [phone] Jonnie Kubly K Chaya Dehaan  Sonographer Comments: Technically difficult study due to poor echo windows and patient is obese. Image acquisition challenging due to patient body habitus and Image acquisition challenging due to respiratory motion. IMPRESSIONS  1. Left ventricular ejection fraction, by estimation, is 60 to 65%. The left ventricle has normal function. The left ventricle has no regional wall motion abnormalities. Left ventricular diastolic parameters were normal.  2. Right ventricular systolic function is normal. The right ventricular size is mildly enlarged. Tricuspid regurgitation signal is inadequate for assessing PA pressure.  3. No valvular stenoses or regurgitation noted. FINDINGS  Left Ventricle: Left ventricular ejection fraction, by estimation, is 60 to 65%. The left ventricle has normal function. The left ventricle has no regional wall motion abnormalities.  The left ventricular internal cavity size was normal in size. There is  no left ventricular hypertrophy. Left ventricular diastolic parameters were normal. Right Ventricle: The right ventricular size is mildly enlarged. Right vetricular wall  thickness was not well visualized. Right ventricular systolic function is normal. Tricuspid regurgitation signal is inadequate for assessing PA pressure. The tricuspid regurgitant velocity is 1.36 m/s, and with an assumed right atrial pressure of 3 mmHg, the estimated right ventricular systolic pressure is 97.9 mmHg. Left Atrium: Left atrial size was normal in size. Right Atrium: Right atrial size was normal in size. Pericardium: There is no evidence of pericardial effusion. Mitral Valve: The mitral valve is grossly normal. No evidence of mitral valve regurgitation. No evidence of mitral valve stenosis. Tricuspid Valve: The tricuspid valve is not well visualized. Tricuspid valve regurgitation is not demonstrated. No evidence of tricuspid stenosis. Aortic Valve: The aortic valve was not well visualized. Aortic valve regurgitation is not visualized. No aortic stenosis is present. Aortic valve mean gradient measures 7.5 mmHg. Aortic valve peak gradient measures 13.4 mmHg. Aortic valve area, by VTI measures 2.97 cm. Pulmonic Valve: The pulmonic valve was not well visualized. Pulmonic valve regurgitation is not visualized. No evidence of pulmonic stenosis. Aorta: The aortic root is normal in size and structure. Venous: The inferior vena cava was not well visualized. IAS/Shunts: No atrial level shunt detected by color flow Doppler.  LEFT VENTRICLE PLAX 2D LVIDd:         4.20 cm      Diastology LVIDs:         2.90 cm      LV e' medial:    7.40 cm/s LV PW:         1.10 cm      LV E/e' medial:  11.2 LV IVS:        1.10 cm      LV e' lateral:   14.80 cm/s LVOT diam:     2.30 cm      LV E/e' lateral: 5.6 LV SV:         108 LV SV Index:   42 LVOT Area:     4.15 cm  LV Volumes (MOD) LV vol  d, MOD A2C: 140.0 ml LV vol d, MOD A4C: 128.0 ml LV vol s, MOD A2C: 71.6 ml LV vol s, MOD A4C: 50.4 ml LV SV MOD A2C:     68.4 ml LV SV MOD A4C:     128.0 ml LV SV MOD BP:      74.4 ml RIGHT VENTRICLE RV Basal diam:  4.40 cm RV Mid diam:    4.20 cm RV S prime:     17.30 cm/s TAPSE (M-mode): 3.1 cm LEFT ATRIUM           Index        RIGHT ATRIUM           Index LA diam:      3.50 cm 1.37 cm/m   RA Area:     17.90 cm LA Vol (A4C): 44.8 ml 17.51 ml/m  RA Volume:   52.30 ml  20.44 ml/m  AORTIC VALVE                     PULMONIC VALVE AV Area (Vmax):    2.47 cm      PV Vmax:       0.90 m/s AV Area (Vmean):   2.40 cm      PV Peak grad:  3.2 mmHg AV Area (VTI):     2.97 cm AV Vmax:           183.00 cm/s AV Vmean:  127.000 cm/s AV VTI:            0.362 m AV Peak Grad:      13.4 mmHg AV Mean Grad:      7.5 mmHg LVOT Vmax:         109.00 cm/s LVOT Vmean:        73.300 cm/s LVOT VTI:          0.259 m LVOT/AV VTI ratio: 0.72  AORTA Ao Root diam: 3.90 cm Ao Asc diam:  3.80 cm MITRAL VALVE               TRICUSPID VALVE MV Area (PHT): 3.31 cm    TR Peak grad:   7.4 mmHg MV Decel Time: 229 msec    TR Vmax:        136.00 cm/s MR Peak grad: 8.6 mmHg MR Vmax:      147.00 cm/s  SHUNTS MV E velocity: 82.70 cm/s  Systemic VTI:  0.26 m MV A velocity: 72.40 cm/s  Systemic Diam: 2.30 cm MV E/A ratio:  1.14 Vishnu Priya Mallipeddi Electronically signed by Lorelee Cover Mallipeddi Signature Date/Time: 08/19/2022/1:22:47 PM    Final    MR ABDOMEN W WO CONTRAST  Result Date: 08/19/2022 CLINICAL DATA:  Hepatic mass on recent CT.  Chronic hepatitis-C. EXAM: MRI ABDOMEN WITHOUT AND WITH CONTRAST TECHNIQUE: Multiplanar multisequence MR imaging of the abdomen was performed both before and after the administration of intravenous contrast. CONTRAST:  47m GADAVIST GADOBUTROL 1 MMOL/ML IV SOLN COMPARISON:  CT on 08/18/2022 FINDINGS: Lower chest: Bilateral lower lobe infiltrates show no significant change compared to recent CT.  Hepatobiliary: Marked caudate lobe hypertrophy is suspicious for cirrhosis. Numerous masses are seen throughout the right hepatic lobe which show arterial phase hyperenhancement. Largest of these in segment 8 measures 7.7 x 5.7 cm and shows central areas of necrosis. The other hypervascular masses are smaller, ranging in size from less than 1 cm to 3.6 cm. These findings are highly suspicious for multifocal hepatocellular carcinoma. Gallbladder is unremarkable. No evidence of biliary ductal dilatation. Pancreas:  No mass or inflammatory changes. Spleen:  Within normal limits in size and appearance. Adrenals/Urinary Tract: No suspicious masses identified. Several benign Bosniak category 1 and 2 renal cysts are seen (no followup imaging is recommended) . No evidence of hydronephrosis. Stomach/Bowel: Unremarkable. Vascular/Lymphatic: Mild lymphadenopathy is seen in the gastrohepatic ligament, porta hepatis, and retroperitoneum, with largest index lymph node in the gastrohepatic ligament measuring 1.6 cm short axis. No acute vascular findings. Other:  None. Musculoskeletal:  No suspicious bone lesions identified. IMPRESSION: Marked caudate lobe hypertrophy, highly suspicious for hepatic cirrhosis. Numerous hypervascular masses throughout the right hepatic lobe, highly suspicious for multifocal hepatocellular carcinoma. Recommend correlation with AFP and consider tissue sampling. Mild abdominal lymphadenopathy, with differential diagnosis including metastatic disease and reactive lymphadenopathy in setting of cirrhosis. Bilateral lower lobe infiltrates, as better demonstrated on recent CT. Electronically Signed   By: JMarlaine HindM.D.   On: 08/19/2022 12:54   CT Abdomen Pelvis W Contrast  Result Date: 08/18/2022 CLINICAL DATA:  Acute generalized abdominal pain. EXAM: CT ABDOMEN AND PELVIS WITH CONTRAST TECHNIQUE: Multidetector CT imaging of the abdomen and pelvis was performed using the standard protocol following  bolus administration of intravenous contrast. RADIATION DOSE REDUCTION: This exam was performed according to the departmental dose-optimization program which includes automated exposure control, adjustment of the mA and/or kV according to patient size and/or use of iterative reconstruction technique. CONTRAST:  1050mOMNIPAQUE IOHEXOL 300  MG/ML  SOLN COMPARISON:  August 16, 2022.  October 13, 2018. FINDINGS: Lower chest: Mild bibasilar opacities are noted concerning for atelectasis, infiltrates or possibly scarring. Hepatobiliary: No gallstones or biliary dilatation is noted. There is interval development a 6.5 x 5.2 cm heterogeneously enhancing mass in the right hepatic lobe which was not present on the prior MRI exam of 2019. This is highly concerning for neoplasm or malignancy. Pancreas: Unremarkable. No pancreatic ductal dilatation or surrounding inflammatory changes. Spleen: Normal in size without focal abnormality. Adrenals/Urinary Tract: Adrenal glands appear normal. Grossly stable multi-septated cystic lesion is seen involving the upper pole the right kidney with peripheral calcifications. No hydronephrosis or renal obstruction is noted. Urinary bladder is unremarkable. Stomach/Bowel: Stomach is within normal limits. Appendix appears normal. No evidence of bowel wall thickening, distention, or inflammatory changes. Vascular/Lymphatic: Aortic atherosclerosis. Mildly enlarged periaortic adenopathy is noted with largest lymph node measuring 12 mm. Mildly enlarged adenopathy is also noted in the gastrohepatic ligament, but this is unchanged compared to prior exam. Reproductive: Prostate is unremarkable. Other: No abdominal wall hernia or abnormality. No abdominopelvic ascites. Musculoskeletal: Status post left total hip arthroplasty. No acute osseous abnormality is noted. IMPRESSION: Interval development of 6.5 x 5.2 cm heterogeneously enhancing mass in right hepatic lobe. This is highly concerning for  neoplasm or malignancy, and further evaluation with MRI is recommended. Mildly enlarged periaortic adenopathy is noted, with the largest lymph node measuring 12 mm. Metastatic disease cannot be excluded. Mildly enlarged adenopathy is also noted in the gastrohepatic ligament, but this is unchanged compared to prior exam. Stable bibasilar lung opacities are noted concerning for inflammation, atelectasis or possibly scarring. Grossly stable multi-septated cystic lesion is seen involving upper pole of right kidney with peripheral calcifications compared to prior exam of 2019, most likely representing benign etiology. Aortic Atherosclerosis (ICD10-I70.0). Electronically Signed   By: Marijo Conception M.D.   On: 08/18/2022 18:18   CT Chest W Contrast  Result Date: 08/18/2022 CLINICAL DATA:  Cough and persistent rales in left lower lobe. Treated for pneumonia. EXAM: CT CHEST WITH CONTRAST TECHNIQUE: Multidetector CT imaging of the chest was performed during intravenous contrast administration. RADIATION DOSE REDUCTION: This exam was performed according to the departmental dose-optimization program which includes automated exposure control, adjustment of the mA and/or kV according to patient size and/or use of iterative reconstruction technique. CONTRAST:  69m ISOVUE-300 IOPAMIDOL (ISOVUE-300) INJECTION 61% COMPARISON:  Chest radiograph 07/19/2022 and chest CT 09/30/2018 FINDINGS: Cardiovascular: Heart size is normal. Coronary artery calcifications. Some calcifications at the aortic root. Normal caliber thoracic aorta. Enlargement of the main pulmonary artery measuring 3.9 cm. Mediastinum/Nodes: Multiple thyroid nodules. The left thyroid lobe is markedly enlarged measuring up to 3.5 cm. Patient has known thyroid nodules with previous thyroid ultrasound. Please refer to the previous thyroid ultrasound report. Multiple small mediastinal and hilar lymph nodes are again noted. Overall, the lymph nodes have slightly enlarged  in size since 2019. Right hilar lymph node on image 104/2 measures 1.0 cm in the short axis and previously measured 0.8 cm. Lungs/Pleura: Trachea and mainstem bronchi are patent. Diffuse airspace disease in the left lower lobe and lower aspects of the left upper lobe. Parenchymal densities in the posterior right lower lobe and right upper lobe. These parenchymal lung findings are most compatible with multifocal pneumonia. Disease is most prominent in the left lower lobe. No pleural effusions. Upper Abdomen: There is concern for multiple new right hepatic lesions including a poorly defined lesion on image 163/2 measuring  up to 5.9 cm. New low-density lesion at the hepatic dome measuring 1.6 cm on image 147/2. Enlargement of a gastrohepatic ligament lymph node on image 166/2 measuring 1.7 cm in the short axis and previously measuring 1.4 cm. Multiple enlarged lymph nodes in the upper abdomen. Again noted is a partially calcified cystic structure involving the right kidney upper pole that is incompletely evaluated. Musculoskeletal: No suspicious osseous findings. IMPRESSION: 1. Multifocal airspace disease in both lungs, left side greater than right. Findings are most compatible with multifocal pneumonia. 2. Multiple new hepatic lesions with enlarging lymph nodes in the upper abdomen and chest. Findings are concerning for metastatic disease. Based on the multifocal pneumonia, hepatic abscesses would also be in the differential diagnosis. Recommend further characterization of the abdomen and pelvis with CT with IV contrast. 3. Enlargement of the main pulmonary artery could be associated with pulmonary hypertension. 4. Coronary artery calcifications. 5. Multinodular goiter. Patient has known thyroid nodules and previous thyroid ultrasound. These results will be called to the ordering clinician or representative by the Radiologist Assistant, and communication documented in the PACS or [employer]. Electronically  Signed   By: Markus Daft M.D.   On: 08/18/2022 08:41    Microbiology: Results for orders placed or performed during the hospital encounter of 08/18/22  Blood culture (routine x 2)     Status: None (Preliminary result)   Collection Time: 08/18/22  5:29 PM   Specimen: BLOOD  Result Value Ref Range Status   Specimen Description   Final    BLOOD RIGHT ANTECUBITAL Performed at [facility] [address], [place], [place] [address]    Special Requests   Final    BOTTLES DRAWN AEROBIC ONLY Blood Culture adequate volume Performed at [facility] [address] [place], [place] [address]    Culture   Final    NO GROWTH 3 DAYS Performed at [facility], [place] [address] [place], [place] [address]    Report Status PENDING  Incomplete  Resp Panel by RT-PCR (Flu A&B, Covid) Anterior Nasal Swab     Status: None   Collection Time: 08/18/22 10:26 PM   Specimen: Anterior Nasal Swab  Result Value Ref Range Status   SARS Coronavirus 2 by RT PCR NEGATIVE NEGATIVE Final    Comment: (NOTE) SARS-CoV-2 target nucleic acids are NOT DETECTED.  The SARS-CoV-2 RNA is generally detectable in upper respiratory specimens during the acute phase of infection. The lowest concentration of SARS-CoV-2 viral copies this assay can detect is 138 copies/mL. A negative result does not preclude SARS-Cov-2 infection and should not be used as the sole basis for treatment or other patient management decisions. A negative result may occur with  improper specimen collection/handling, submission of specimen other than nasopharyngeal swab, presence of viral mutation(s) within the areas targeted by this assay, and inadequate number of viral copies(<138 copies/mL). A negative result must be combined with clinical observations, patient history, and epidemiological information. The expected result is Negative.  Fact Sheet for Patients:   EntrepreneurPulse.com.au  Fact Sheet for Healthcare Providers:  IncredibleEmployment.be  This test is no t yet approved or cleared by the [place] FDA and  has been authorized for detection and/or diagnosis of SARS-CoV-2 by FDA under an Emergency Use Authorization (EUA). This EUA will remain  in effect (meaning this test can be used) for the duration of the COVID-19 declaration under Section 564(b)(1) of the Act, 21 U.S.C.section 360bbb-3(b)(1), unless the authorization is terminated  or revoked sooner.       Influenza A by PCR NEGATIVE NEGATIVE Final   Influenza B by PCR NEGATIVE NEGATIVE Final    Comment: (NOTE) The Xpert Xpress SARS-CoV-2/FLU/RSV plus assay is intended as an aid in the diagnosis of influenza from Nasopharyngeal swab specimens and should not be used as a sole basis for treatment. Nasal washings and aspirates are unacceptable for Xpert Xpress SARS-CoV-2/FLU/RSV testing.  Fact Sheet for Patients: EntrepreneurPulse.com.au  Fact Sheet for Healthcare Providers: IncredibleEmployment.be  This test is not yet approved or cleared by the [place] FDA and has been authorized for detection and/or diagnosis of SARS-CoV-2 by FDA under an Emergency Use Authorization (EUA). This EUA will remain in effect (meaning this test can be used) for the duration of the COVID-19 declaration under Section 564(b)(1) of the Act, 21 U.S.C. section 360bbb-3(b)(1), unless the authorization is terminated or revoked.  Performed at [facility], [place] [address], [place], [place] [address]   Respiratory (~20 pathogens) panel by PCR     Status: None   Collection Time: 08/18/22 10:26 PM  Result Value Ref Range Status   Adenovirus NOT DETECTED NOT DETECTED Final   Coronavirus 229E NOT DETECTED NOT DETECTED Final    Comment: (NOTE) The Coronavirus on the Respiratory Panel, DOES NOT test for  the novel  Coronavirus (2019 nCoV)    Coronavirus HKU1 NOT DETECTED NOT DETECTED Final   Coronavirus NL63 NOT DETECTED NOT DETECTED Final   Coronavirus OC43 NOT DETECTED NOT DETECTED Final   Metapneumovirus NOT DETECTED NOT DETECTED Final   Rhinovirus / Enterovirus NOT DETECTED NOT DETECTED Final   Influenza A NOT DETECTED NOT DETECTED Final   Influenza B NOT DETECTED NOT DETECTED Final   Parainfluenza Virus 1 NOT DETECTED NOT DETECTED Final   Parainfluenza Virus 2 NOT DETECTED NOT DETECTED Final   Parainfluenza Virus 3 NOT DETECTED NOT DETECTED Final   Parainfluenza Virus 4 NOT DETECTED NOT DETECTED Final   Respiratory Syncytial Virus NOT DETECTED NOT DETECTED Final   Bordetella pertussis NOT DETECTED NOT DETECTED Final   Bordetella Parapertussis NOT DETECTED NOT DETECTED Final   Chlamydophila pneumoniae NOT DETECTED NOT DETECTED Final   Mycoplasma pneumoniae NOT DETECTED NOT DETECTED Final    Comment: Performed at [facility], [place]. [address], [place], [place] [address]  Blood culture (routine x 2)     Status: None (Preliminary result)   Collection Time: 08/19/22  4:09 AM   Specimen: BLOOD  Result Value Ref Range Status   Specimen Description   Final    BLOOD LEFT ANTECUBITAL Performed at [facility] [address], [place], [place] [address]    Special Requests   Final    BOTTLES DRAWN AEROBIC ONLY Blood Culture adequate volume Performed at [facility] [address] [place], [place] [address]    Culture   Final    NO GROWTH 2 DAYS Performed at [facility] [address] [place], [place] [address]    Report Status PENDING  Incomplete    Labs: CBC: Recent Labs  Lab 08/18/22 1651 08/19/22 0409 08/20/22 0352 08/21/22 0756  WBC 6.5 3.9* 4.1 4.6  NEUTROABS 4.8  --  2.9  --   HGB 18.0* 15.9 16.6 16.5  HCT 54.7* 47.8 51.4 49.2  MCV 96.0 95.0 98.3 94.4  PLT 215 179 149* 093   Basic Metabolic Panel: Recent Labs   Lab 08/18/22 1651 08/19/22 0409 08/20/22 0352 08/21/22 0756  NA 138 139 137 135  K 4.5 4.1 3.6 4.0  CL 104 106 104 103  CO2 '27 26 22 24  '$ GLUCOSE 109* 122* 107* 132*  BUN '10 8 8 '$ 6*  CREATININE 0.95 0.80 0.65 0.69  CALCIUM 8.9 8.2* 8.2* 8.4*   Liver Function Tests: Recent Labs  Lab 08/18/22 1651 08/19/22 0409 08/20/22 0352 08/21/22 0756  AST '30 26 25 29  '$ ALT '30 24 23 27  '$ ALKPHOS 73 57 63 64  BILITOT 1.2 0.9 0.9 1.2  PROT 6.9 5.4* 5.9* 6.1*  ALBUMIN 4.0 3.1* 3.3* 3.4*   CBG: Recent Labs  Lab 08/20/22 1128 08/20/22 1603 08/20/22 2139 08/21/22 0744 08/21/22 1132  GLUCAP 128* 179* 132* 125* 128*    Discharge time spent: greater than 30 minutes.  Signed: Estill Cotta, MD [facility] 08/21/2022

## 2022-08-22 ENCOUNTER — Ambulatory Visit (INDEPENDENT_AMBULATORY_CARE_PROVIDER_SITE_OTHER): Payer: BC Managed Care – PPO

## 2022-08-22 ENCOUNTER — Ambulatory Visit: Payer: BC Managed Care – PPO

## 2022-08-22 ENCOUNTER — Telehealth: Payer: Self-pay

## 2022-08-22 DIAGNOSIS — Z7901 Long term (current) use of anticoagulants: Secondary | ICD-10-CM | POA: Diagnosis not present

## 2022-08-22 LAB — POCT INR: INR: 1.3 — AB (ref 2.0–3.0)

## 2022-08-22 LAB — HCV RNA DIAGNOSIS, NAA: HCV RNA, Quantitation: NOT DETECTED IU/mL

## 2022-08-22 NOTE — Telephone Encounter (Signed)
Transition Care Management Follow-up Telephone Call Date of discharge and from where: [facility] 08-21-22 Dx: multifocal pneumonia How have you been since you were released from the hospital? Doing ok  Any questions or concerns? No  Items Reviewed: Did the pt receive and understand the discharge instructions provided? Yes  Medications obtained and verified? Yes  Other? No  Any new allergies since your discharge? No  Dietary orders reviewed? Yes Do you have support at home? Yes   Home Care and Equipment/Supplies: Were home health services ordered? no If so, what is the name of the agency? na  Has the agency set up a time to come to the patient's home? not applicable Were any new equipment or medical supplies ordered?  No What is the name of the medical supply agency? na Were you able to get the supplies/equipment? not applicable Do you have any questions related to the use of the equipment or supplies? No  Functional Questionnaire: (I = Independent and D = Dependent) ADLs: I  Bathing/Dressing- I  Meal Prep- I  Eating- I  Maintaining continence- I  Transferring/Ambulation- I  Managing Meds- I  Follow up appointments reviewed:  PCP Hospital f/u appt confirmed? Yes  [facility]  Scheduled to see coumadin clinic on 08-22-22 @ 330pm. Are transportation arrangements needed? No  If their condition worsens, is the pt aware to call PCP or go to the Emergency Dept.? Yes Was the patient provided with contact information for the PCP's office or ED? Yes Was to pt encouraged to call back with questions or concerns? Yes   Juanda Crumble LPN [facility] Direct Dial [phone]

## 2022-08-22 NOTE — Telephone Encounter (Signed)
Discharged from hospital. Message sent to Dr. Collie Siad team to arrange follow up.

## 2022-08-22 NOTE — Progress Notes (Signed)
Pt was admitted to [facility] on 10/21 for multifocal pneumonia.  Received IV Vancomycin and Cefepime during admission. Was discharged home yesterday on Augmentin that he will finish tomorrow and Zithromax that he will finish on 10/27.    CT completed during stay revealed liver mass. Hospitalists stopped warfarin and gave Vitamin K so that a liver biopsy could be performed on 10/23.  Was put on heparin at that time. Resumed warfarin 5 mg on 10/23 and warfarin 7.5 mg on 10/24.  INR yesterday prior to discharge was 1.4.   Pt discharged home on Lovenox 120 mg subcutaneously Q 12 hours.   INR today is 1.3.  Pt instructed to increase warfarin dose today to 1  tablets (7.5 mg) and increase dose tomorrow to 1  tablets (7.5 mg).  Then resume normal dosing of 1 tablet (5 mg) daily EXCEPT take 1  tablets (7.5 mg) on Sundays.  Pt also instructed to continue taking Lovenox Q 12 hours until next INR check on 08/27/22.

## 2022-08-22 NOTE — Patient Instructions (Signed)
Increase dose today to 1 1/2 tablets and increase dose tomorrow to 1 1/2 tablets. Then resume weekly dose of 1 tablet daily EXCEPT take 1 1/2 tablets on Sundays. Recheck on 08/27/22.    Continue taking Lovenox injections twice a day until INR recheck on 10/30.

## 2022-08-23 ENCOUNTER — Encounter: Payer: Self-pay | Admitting: Family Medicine

## 2022-08-23 LAB — BLOOD CULTURE ID PANEL (REFLEXED) - BCID2

## 2022-08-23 LAB — HCV RT-PCR, QUANT (NON-GRAPH): Hepatitis C Quantitation: NOT DETECTED IU/mL

## 2022-08-23 LAB — SURGICAL PATHOLOGY

## 2022-08-23 LAB — HCV AB W REFLEX TO QUANT PCR: HCV Ab: REACTIVE — AB

## 2022-08-23 NOTE — Progress Notes (Signed)
Patient was admitted to the hospital 08/18/2022-08/21/2022 for multifocal pneumonia and new hepatic mass on CT.  Please see discharge summary by Dr. Tana Coast for details.  Patient completed course of antibiotics with Zithromax and Vantin.  I am currently covering hospitalist patients at [facility] and received a call from micro lab at [facility] (10/26) informing me that the patient's blood cultures drawn 10/21 just came back positive with Gram stain showing gram-positive cocci in clusters (BCID results not available).  Blood cultures drawn 10/22 showing no growth x4 days.  I spoke to Dr. Linus Salmons with infectious disease who feels that this is likely a contaminant/possibly staph epidermis.  I also called the patient Mr. Zandra Abts. [place] and spoke to him on the phone.  I discussed blood culture results with him.  Patient informed me that he feels great and is doing much better since he has left the hospital.  He is reporting significant improvement of his pneumonia symptoms.  He denies any fevers or chills.  I have explained to the patient that if he starts feeling poorly or starts having fevers or chills, he needs to immediately return to the emergency room.  Patient expressed his understanding and again confirmed that he feels great and is not having any symptoms at this time.

## 2022-08-24 LAB — CULTURE, BLOOD (ROUTINE X 2)
Culture: NO GROWTH
Special Requests: ADEQUATE

## 2022-08-24 NOTE — Telephone Encounter (Signed)
Recall placed so we can schedule when [facility] office is open to scheduling.

## 2022-08-25 LAB — CULTURE, BLOOD (ROUTINE X 2): Special Requests: ADEQUATE

## 2022-08-26 LAB — BPAM FFP
Blood Product Expiration Date: 202310282359
Blood Product Expiration Date: 202310282359
Unit Type and Rh: 1700
Unit Type and Rh: 1700

## 2022-08-26 LAB — PREPARE FRESH FROZEN PLASMA

## 2022-08-27 ENCOUNTER — Ambulatory Visit: Payer: BC Managed Care – PPO

## 2022-08-27 ENCOUNTER — Encounter (INDEPENDENT_AMBULATORY_CARE_PROVIDER_SITE_OTHER): Payer: Self-pay

## 2022-08-27 ENCOUNTER — Telehealth: Payer: Self-pay | Admitting: Oncology

## 2022-08-27 ENCOUNTER — Inpatient Hospital Stay: Payer: BC Managed Care – PPO | Attending: Oncology | Admitting: Oncology

## 2022-08-27 ENCOUNTER — Encounter: Payer: Self-pay | Admitting: Licensed Clinical Social Worker

## 2022-08-27 ENCOUNTER — Encounter: Payer: Self-pay | Admitting: Oncology

## 2022-08-27 ENCOUNTER — Inpatient Hospital Stay: Payer: BC Managed Care – PPO | Admitting: Licensed Clinical Social Worker

## 2022-08-27 ENCOUNTER — Encounter: Payer: Self-pay | Admitting: Family Medicine

## 2022-08-27 ENCOUNTER — Inpatient Hospital Stay: Payer: BC Managed Care – PPO

## 2022-08-27 VITALS — BP 152/59 | HR 83 | Temp 98.0°F | Resp 18 | Wt 292.3 lb

## 2022-08-27 DIAGNOSIS — D6861 Antiphospholipid syndrome: Secondary | ICD-10-CM | POA: Insufficient documentation

## 2022-08-27 DIAGNOSIS — I82411 Acute embolism and thrombosis of right femoral vein: Secondary | ICD-10-CM | POA: Diagnosis not present

## 2022-08-27 DIAGNOSIS — C221 Intrahepatic bile duct carcinoma: Secondary | ICD-10-CM

## 2022-08-27 DIAGNOSIS — Z7189 Other specified counseling: Secondary | ICD-10-CM

## 2022-08-27 DIAGNOSIS — E042 Nontoxic multinodular goiter: Secondary | ICD-10-CM | POA: Insufficient documentation

## 2022-08-27 DIAGNOSIS — Z7901 Long term (current) use of anticoagulants: Secondary | ICD-10-CM | POA: Insufficient documentation

## 2022-08-27 DIAGNOSIS — Z86718 Personal history of other venous thrombosis and embolism: Secondary | ICD-10-CM | POA: Insufficient documentation

## 2022-08-27 LAB — CBC WITH DIFFERENTIAL/PLATELET
Abs Immature Granulocytes: 0.01 10*3/uL (ref 0.00–0.07)
Basophils Absolute: 0 10*3/uL (ref 0.0–0.1)
Basophils Relative: 1 %
Eosinophils Absolute: 0 10*3/uL (ref 0.0–0.5)
Eosinophils Relative: 1 %
HCT: 49.9 % (ref 39.0–52.0)
Hemoglobin: 16.6 g/dL (ref 13.0–17.0)
Immature Granulocytes: 0 %
Lymphocytes Relative: 12 %
Lymphs Abs: 0.5 10*3/uL — ABNORMAL LOW (ref 0.7–4.0)
MCH: 31.5 pg (ref 26.0–34.0)
MCHC: 33.3 g/dL (ref 30.0–36.0)
MCV: 94.7 fL (ref 80.0–100.0)
Monocytes Absolute: 0.5 10*3/uL (ref 0.1–1.0)
Monocytes Relative: 12 %
Neutro Abs: 3.1 10*3/uL (ref 1.7–7.7)
Neutrophils Relative %: 74 %
Platelets: 210 10*3/uL (ref 150–400)
RBC: 5.27 MIL/uL (ref 4.22–5.81)
RDW: 12.8 % (ref 11.5–15.5)
WBC: 4.2 10*3/uL (ref 4.0–10.5)
nRBC: 0 % (ref 0.0–0.2)

## 2022-08-27 LAB — COMPREHENSIVE METABOLIC PANEL
ALT: 28 U/L (ref 0–44)
AST: 30 U/L (ref 15–41)
Albumin: 3.8 g/dL (ref 3.5–5.0)
Alkaline Phosphatase: 66 U/L (ref 38–126)
Anion gap: 6 (ref 5–15)
BUN: 10 mg/dL (ref 8–23)
CO2: 28 mmol/L (ref 22–32)
Calcium: 8.7 mg/dL — ABNORMAL LOW (ref 8.9–10.3)
Chloride: 104 mmol/L (ref 98–111)
Creatinine, Ser: 0.78 mg/dL (ref 0.61–1.24)
GFR, Estimated: >60 mL/min (ref >60)
Glucose, Bld: 125 mg/dL — ABNORMAL HIGH (ref 70–99)
Potassium: 4.5 mmol/L (ref 3.5–5.1)
Sodium: 138 mmol/L (ref 135–145)
Total Bilirubin: 0.7 mg/dL (ref 0.3–1.2)
Total Protein: 6.7 g/dL (ref 6.5–8.1)

## 2022-08-27 MED ORDER — ENOXAPARIN SODIUM 120 MG/0.8ML IJ SOSY: 120.0000 mg | PREFILLED_SYRINGE | Freq: Two times a day (BID) | INTRAMUSCULAR | 0 refills | Status: DC

## 2022-08-27 NOTE — Telephone Encounter (Signed)
Per [place], lab,he will see appts on mychart

## 2022-08-27 NOTE — Progress Notes (Signed)
[facility] Clinical Social Work  Clinical Social Work was referred by medical provider for assessment of psychosocial needs.  Clinical Social Worker attempted to contact patient by phone  to offer support and assess for needs.  CSW left voicemail with contact information and request for a return call.   FA  Dariela Stoker, LCSW  Clinical Social Worker [facility]          

## 2022-08-27 NOTE — Progress Notes (Unsigned)
Provided navigator contact information. Referral to social work to discuss disability further.

## 2022-08-27 NOTE — Progress Notes (Signed)
START OFF PATHWAY REGIMEN - Other   [id number]:Cisplatin IV D1,8 + Durvalumab 20 mg/kg IV D1 + Gemcitabine IV D1,8 q21 Days for up to 8 Cycles Followed by Durvalumab 20 mg/kg IV D1 q28 Days:   Cycles 1 through up to 8: A cycle is every 21 days:     Durvalumab      Gemcitabine      Cisplatin    Cycles 9 and beyond: A cycle is every 28 days:     Durvalumab   **Always confirm dose/schedule in your pharmacy ordering system**  Patient Characteristics: Intent of Therapy: Non-Curative / Palliative Intent, Discussed with Patient 

## 2022-08-27 NOTE — Progress Notes (Signed)
[place] Work  Initial Assessment   Christopher Marsh is a 61 y.o. year old male contacted by phone. Clinical Social Work was referred by medical provider for assessment of psychosocial needs.   SDOH (Social Determinants of Health) assessments performed: Yes SDOH Interventions    Flowsheet Row Clinical Support from 08/27/2022 in [facility] at [place] Interventions   Food Insecurity Interventions Intervention Not Indicated  Housing Interventions Intervention Not Indicated  Transportation Interventions Patient Resources (Friends/Family), Intervention Not Indicated  Alcohol Usage Interventions Intervention Not Indicated (Score <7)  Financial Strain Interventions Intervention Not Indicated  Physical Activity Interventions Intervention Not Indicated  Stress Interventions Intervention Not Indicated, Provide Counseling  Social Connections Interventions Intervention Not Indicated       SDOH Screenings   Food Insecurity: No Food Insecurity (08/27/2022)  Housing: Low Risk  (08/27/2022)  Transportation Needs: No Transportation Needs (08/27/2022)  Utilities: Not At Risk (08/18/2022)  Alcohol Screen: Low Risk  (08/27/2022)  Depression (PHQ2-9): Low Risk  (08/27/2022)  Financial Resource Strain: Low Risk  (08/27/2022)  Physical Activity: Insufficiently Active (08/27/2022)  Social Connections: Socially Integrated (08/27/2022)  Stress: Stress Concern Present (08/27/2022)  Tobacco Use: Medium Risk (08/27/2022)     Distress Screen completed: No    12/18/2021    9:31 AM  ONCBCN DISTRESS SCREENING  Screening Type Initial Screening  Distress experienced in past week (1-10) 0      Family/Social Information:  Housing Arrangement: patient lives with spouse  Christopher Marsh, Christopher Marsh (Spouse) [phone] Family members/support persons in your life? 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  Income source: Employment Financial concerns: Yes, due to illness and/or loss of work during treatment Type of concern: Medical bills Food access concerns: no Religious or spiritual practice: Yes-Ep[employer] Services Currently in place:  BCBS PPO  Coping/ Adjustment to diagnosis: Patient understands treatment plan and what happens next? yes, patient has been very recently diagnosed today and is still processing information. Concerns about diagnosis and/or treatment: Pain or discomfort during procedures, Feelings of anger or sadness, How I will care for other members of my family, Losing my job and/or losing income, Overwhelmed by information, How will I care for myself, Quality of life, and Spouse's well-bring Patient reported stressors: Work/ school, Publishing rights manager, Anxiety/ nervousness, Adjusting to my illness, Facing my mortality, Physical issues, and concern's over spouse's well-being  Hopes and/or priorities: N/A Patient enjoys  N/A Current coping skills/ strengths: Ability for insight , Average or above average intelligence , Capable of independent living , Armed forces logistics/support/administrative officer , Scientist, research (life sciences) , Motivation for treatment/growth , Physical Health , Religious Affiliation , Supportive family/friends , and Work skills     SUMMARY: Current SDOH Barriers:  No SDOH concerns  Clinical Social Work Clinical Goal(s):  Patient will follow-up with CSW if once he decides if he will begin treatment.  Patient would like CSW to speak with spouse and patient.  Interventions: Discussed common feeling and emotions when being diagnosed with cancer, and the importance of support during treatment Informed patient of the support team roles and support services at [facility] Provided CSW contact information and encouraged patient to call with any questions or concerns Provided patient with information about CSW  role in patient care and available resources.  CSW referred patient to [facility] for disability assistance.  CSW will email [facility] consent form,  calendar and group information email at [email or web address]    Follow Up Plan: Patient will contact CSW with any support or resource needs Patient verbalizes understanding of plan: Yes    Arminta Gamm, LCSW

## 2022-08-28 ENCOUNTER — Other Ambulatory Visit: Payer: Self-pay

## 2022-08-28 NOTE — Telephone Encounter (Signed)
Yes we always allot a full 30 minutes for a hospital follow up

## 2022-08-29 LAB — CANCER ANTIGEN 19-9: CA 19-9: 16 U/mL (ref 0–35)

## 2022-08-29 LAB — CEA: CEA: 2.8 ng/mL (ref 0.0–4.7)

## 2022-08-30 ENCOUNTER — Ambulatory Visit (INDEPENDENT_AMBULATORY_CARE_PROVIDER_SITE_OTHER): Payer: BC Managed Care – PPO | Admitting: Family Medicine

## 2022-08-30 ENCOUNTER — Encounter: Payer: Self-pay | Admitting: Family Medicine

## 2022-08-30 ENCOUNTER — Telehealth: Payer: Self-pay

## 2022-08-30 ENCOUNTER — Encounter: Payer: Self-pay | Admitting: Oncology

## 2022-08-30 ENCOUNTER — Other Ambulatory Visit: Payer: BC Managed Care – PPO

## 2022-08-30 VITALS — BP 140/60 | HR 97 | Temp 98.2°F | Wt 285.6 lb

## 2022-08-30 DIAGNOSIS — D6861 Antiphospholipid syndrome: Secondary | ICD-10-CM | POA: Insufficient documentation

## 2022-08-30 DIAGNOSIS — C221 Intrahepatic bile duct carcinoma: Secondary | ICD-10-CM | POA: Diagnosis not present

## 2022-08-30 DIAGNOSIS — Z7189 Other specified counseling: Secondary | ICD-10-CM | POA: Insufficient documentation

## 2022-08-30 DIAGNOSIS — J189 Pneumonia, unspecified organism: Secondary | ICD-10-CM | POA: Diagnosis not present

## 2022-08-30 NOTE — Telephone Encounter (Addendum)
Please schedule pt for PET ASAP and inform pt of appt. Pt aware of plan.

## 2022-08-30 NOTE — Progress Notes (Addendum)
Hematology/Oncology Progress note [phone] [phone]      ASSESSMENT & PLAN:   Cancer Staging  Cholangiocarcinoma [facility]) Staging form: Intrahepatic Bile Duct, AJCC 8th Edition - Clinical stage from 08/27/2022: Stage IV (cT2, cN1, cM1) - Signed by Earlie Server, MD on 09/13/2022   Cholangiocarcinoma [facility]) Imaging findings and pathology results were reviewed with patient.  Clinical diagnosis is cholangiocarcinoma, although atypical with both normal CEA and CA 19.9 Case was presented at tumor board, I have also discussed with pathologist Dr.Rubinas,  IHC pattern is more consistent with adenocarcinoma, unlikely [place] [due to negative HepPar 1 and arginase], cholangiocarcinoma is favored, although this is a diagnosis of exclusion.  Recommend checking molecular testing, PET scan to look for other possible primaries.  Tumor board consensus reached upon that, If additional testing results exclude other primaries, will proceed with chemotherapy treatments for cholangiocarcinoma. Discussed about palliative chemotherapy with gemcitabine, Cisplatin and Keytruda. The diagnosis and care plan were discussed with patient in detail.  The goal of treatment which is to palliate disease, disease related symptoms, improve quality of life and hopefully prolong life was highlighted in our discussion.   I explained to the patient the risks and benefits of chemotherapy including all but not limited to hair loss, mouth sore, nausea, vomiting, diarrhea, hearing loss, kidney failure, low blood counts, bleeding, neuropathy and risk of life threatening infection and even death, secondary malignancy etc.   Discussed the potential side effects of immunotherapy including but not limited to diarrhea; skin rash; respiratory failure, kidney failure, mental status change, elevated LFTs/liver failure,endocrine abnormalities, acute deterioration  and even death,etc. patient voices understanding and agrees with  proceeding with treatment.  # Chemotherapy education; refer to surgery Medi- port placement - patient's preference. Refer to ENT for baseline hearing testing.  Antiemetics-Zofran and Compazine; EMLA cream sent to pharmacy  09/13/22 Addendum PET results were reviewed with patient. Bone metastasis, nodal involvement. No obvious other primaries were found. I recommend to proceed with Gemcitabine and Cisplatin chemotherapy for working diagnosis of cholangiocarcinoma. This regimen also has good coverage for adenocarcinoma of other origins.  I will hold immunotherapy for now due to concern of left lung pre-existing airspace disease, pending pulmonology evaluation.   NGS showed BAP1 FS mutation. I have asked pathology to add melanoma staining.   Left lung airspace process, patient reports no significant worsening of his breathing status. He has been referred to pulmonology and he will call to get appointment scheduled.   Goals of care, counseling/discussion Discussed with patient.   Antiphospholipid syndrome (HCC) #Recurrent lower extremity DVT, secondary to antiphospholipid syndrome.-.  Persistent lupus anticoagulant positive. Recommend life time anticoagulation with Coumadin with INR goal between 2-3. Should Coumadin clinic to continue to manage Coumadin dosing.  Orders Placed This Encounter  Procedures   Cancer antigen 19-9    Standing Status:   Future    Number of Occurrences:   1    Standing Expiration Date:   08/27/2023   CEA    Standing Status:   Future    Number of Occurrences:   1    Standing Expiration Date:   08/27/2023   Ambulatory referral to General Surgery    Referral Priority:   Routine    Referral Type:   Surgical    Referral Reason:   Specialty Services Required    Referred to Provider:   Herbert Pun, MD    Requested Specialty:   General Surgery    Number of Visits Requested:   1  Ambulatory referral to ENT    Referral Priority:   Routine    Referral Type:    Consultation    Referral Reason:   Specialty Services Required    Requested Specialty:   Otolaryngology    Number of Visits Requested:   1   Ambulatory referral to Social Work    Referral Priority:   Routine    Referral Type:   Consultation    Referral Reason:   Specialty Services Required    Referred to Provider:   Adelene Amas, LCSW    Number of Visits Requested:   1   Follow up TBD All questions were answered. The patient knows to call the clinic with any problems, questions or concerns.  Earlie Server, MD, PhD [facility] Hematology Oncology 08/27/2022   CHIEF COMPLAINTS/REASON FOR VISIT:  Follow up for right lower extremity DVT, positive lupus anticoagulants, liver mass/adenocarcinoma.   HISTORY OF PRESENTING ILLNESS:   Christopher Marsh is a  61 y.o.  male with PMH listed below was seen in consultation at the request of  Laurey Morale, MD  for evaluation of DVT  Patient reports remote history of left lower extremity DVT in 2002.  He was initiated on Lovenox and bridged to Coumadin.  Patient took warfarin for 2 years before anticoagulation was stopped. 12/03/2021, patient presented to emergency room for evaluation of right lower extremity pain and swelling for about a week.  Started on the inner side of right thigh and migrated to the right calf. + Associated with swelling.  Denies any recent injury, hospitalization, surgery.  He first noticed the symptoms after playing basketball with his grandson.   12/03/2021, right lower extremity ultrasound showed occlusive DVT extending from the mid aspect of the femoral vein through the imaged tibial vein.  Age-indeterminate. Patient was started on Xarelto. He was referred to establish care with vascular surgeon and was seen by [name] on 12/06/2021.  Shared decision was made not to proceed with embolectomy.  Continue anticoagulation. Patient was referred to hematology oncology for further evaluation.  Patient denies any family history of  blood clots.  Denies any unintentional weight loss, fever or night sweats. 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  He sometimes stay in his car while waiting for next assessment appointment.  He reports the right lower extremity symptom has improved since the start of Xarelto.  No active bleeding events.  #12/18/2021 hypercoagulable work-up showed JAK2 V617F mutation negative, with reflex to other mutations CALR, MPL, JAK 2 Ex 12-15 mutations negative, negative anticardiolipin IgG and IgM antibodies, positive lupus anticoagulant, negative factor V Leiden mutation, negative prothrombin gene mutation, normal protein C activity, normal protein S antigen level. Patient was recommended to switch to Coumadin with INR goal of 2-3.  #03/15/2022, repeat lupus anticoagulant is persistently positive- + antiphospholipid syndrome.  Patient is currently on Coumadin, managed by Coumadin clinic.     INTERVAL HISTORY Christopher Marsh is a 61 y.o. male who has above history reviewed by me today presents for follow up visit for management of right lower extremity DVT and antiphospholipid syndrome, post hospitalization follow up of liver mass s/p biopsy.   During the interval he was admitted due to multifocal pneumonia, treated with Influenza panel negative, COVID-negative, HIV negative, Complete course of antibiotics with Zithromax, Vantin   + HCV antibody positive, HCV RNA quantification not detected. HCV RT PCR not detected.  Oncology History  Cholangiocarcinoma ([place])  08/18/2022 Imaging   CT chest w contrast showed 1. Multifocal airspace disease in both lungs, left side greater than right. Findings are most compatible with multifocal pneumonia. 2. Multiple new hepatic lesions with enlarging lymph nodes in the upper abdomen and chest. Findings are concerning for metastatic disease. Based on the multifocal pneumonia,  hepatic abscesses would also be in the differential diagnosis. Recommend further characterization of the abdomen and pelvis with CT with IV contrast. 3. Enlargement of the main pulmonary artery could be associated with pulmonary hypertension. 4. Coronary artery calcifications. 5. Multinodular goiter. Patient has known thyroid nodules and previous thyroid ultrasound.     Imaging     08/18/2022 Imaging   CT abdomen pelvis w contrast  Interval development of 6.5 x 5.2 cm heterogeneously enhancing mass in right hepatic lobe. This is highly concerning for neoplasm or malignancy, and further evaluation with MRI is recommended.   Mildly enlarged periaortic adenopathy is noted, with the largest lymph node measuring 12 mm. Metastatic disease cannot be excluded. Mildly enlarged adenopathy is also noted in the gastrohepatic ligament, but this is unchanged compared to prior exam.   Stable bibasilar lung opacities are noted concerning for inflammation, atelectasis or possibly scarring.   Grossly stable multi-septated cystic lesion is seen involving upper pole of right kidney with peripheral calcifications compared to prior exam of 2019, most likely representing benign etiology.   Aortic Atherosclerosis    08/19/2022 Imaging   MR abdomen w wo contrast  Marked caudate lobe hypertrophy, highly suspicious for hepatic cirrhosis.   Numerous hypervascular masses throughout the right hepatic lobe, highly suspicious for multifocal hepatocellular carcinoma. Recommend correlation with AFP and consider tissue sampling.   Mild abdominal lymphadenopathy, with differential diagnosis including metastatic disease and reactive lymphadenopathy in setting of cirrhosis.   Bilateral lower lobe infiltrates, as better demonstrated on recent CT.    08/19/2022 Tumor Marker   AFP 4.1   08/27/2022 Initial Diagnosis   Cholangiocarcinoma  -08/20/22 Liver mass biopsy showed poorly differentiated  carcinoma Immunohistochemical stains show that the poorly differentiated carcinoma is positive for CK7 and MOC-31, suggestive of a poorly differentiated adenocarcinoma.  Immunohistochemical stains for CK20, CDX2, HepPar 1 and arginase are negative.  Immunostain for glypican-3 shows very focal  likely nonspecific labeling.  This immunoprofile is nonspecific but can be compatible with a poorly differentiated primary cholangiocarcinoma in absence of any other lesions.    08/27/2022 Cancer Staging   Staging form: Intrahepatic Bile Duct, AJCC 8th Edition - Clinical stage from 08/27/2022: Stage IIIB (cT2, cN1, cM0) - Signed by Earlie Server, MD on 08/27/2022 Stage prefix: Initial diagnosis   08/27/2022 Tumor Marker   CA 19.9  16 CEA 2.8   09/03/2022 -  Chemotherapy   Patient is on Treatment Plan : Cisplatin D1,8 + Gemcitabine + Pembrolizumab D1,8 q21d x          Review of Systems  Constitutional:  Positive for fatigue. Negative for appetite change, chills and fever.  HENT:   Negative for hearing loss and voice change.   Eyes:  Negative for eye problems.  Respiratory:  Negative for chest tightness and cough.   Cardiovascular:  Negative for chest pain.  Gastrointestinal:  Negative for abdominal distention, abdominal pain and blood in stool.  Endocrine: Negative for hot flashes.  Genitourinary:  Negative for difficulty urinating and frequency.   Musculoskeletal:  Negative for arthralgias.  Skin:  Negative for itching and rash.  Neurological:  Negative for extremity weakness.  Hematological:  Negative for adenopathy. Bruises/bleeds easily.  Psychiatric/Behavioral:  Negative for confusion.     MEDICAL HISTORY:  Past Medical History:  Diagnosis Date   Antiphospholipid antibody syndrome (HCC)    Arthritis    Diabetes mellitus without complication (HCC)    DVT (deep venous thrombosis) (HCC)    Lt leg   Dyspnea    Elevated lipids    Erythrocytosis    Hyperlipidemia    Hypertension     Lower extremity edema    Multinodular thyroid    Post-thrombotic syndrome     SURGICAL HISTORY: Past Surgical History:  Procedure Laterality Date   COLONOSCOPY WITH PROPOFOL N/A 09/23/2018   Per Dr. Alice Reichert, polyp, repeat in 3 yrs    EYE MUSCLE SURGERY     JOINT REPLACEMENT Left 07/22/2017   TOTAL HIP ARTHROPLASTY Left 07/22/2017   Procedure: TOTAL HIP ARTHROPLASTY;  Surgeon: Dereck Leep, MD;  Location: [facility] ORS;  Service: Orthopedics;  Laterality: Left;    SOCIAL HISTORY: Social History   Socioeconomic History   Marital status: Married    Spouse name: Not on file   Number of children: Not on file   Years of education: Not on file   Highest education level: Not on file  Occupational History   Not on file  Tobacco Use   Smoking status: Former    Packs/day: 0.50    Years: 6.00    Total pack years: 3.00    Types: Cigarettes    Quit date: 76    Years since quitting: 41.8   Smokeless tobacco: Never  Vaping Use   Vaping Use: Never used  Substance and Sexual Activity   Alcohol use: Yes    Comment: 2-3 nights a week   Drug use: No   Sexual activity: Not on file  Other Topics Concern   Not on file  Social History Narrative   Not on file   Social Determinants of Health   Financial Resource Strain: Low Risk  (08/27/2022)   Overall Financial Resource Strain (CARDIA)    Difficulty of Paying Living Expenses: Not very hard  Food Insecurity: No Food Insecurity (08/27/2022)   Hunger Vital Sign    Worried About Running Out of Food in the Last Year: Never true    Ran Out of Food in the Last Year: Never true  Transportation Needs: No Transportation Needs (08/27/2022)   PRAPARE - Hydrologist (Medical): No    Lack of Transportation (Non-Medical): No  Physical Activity: Insufficiently Active (08/27/2022)   Exercise Vital Sign    Days of Exercise per Week: 3 days    Minutes of Exercise per Session: 30 min  Stress: Stress Concern Present  (08/27/2022)   [place]    Feeling of Stress : Rather much  Social Connections: Socially Integrated (08/27/2022)   Social Connection and Isolation Panel [NHANES]    Frequency of Communication with Friends and Family: More than three times a week    Frequency of Social Gatherings with Friends and Family: More than three times a week    Attends Religious Services: 1 to 4 times per year    Active Member of [employer] or Organizations: Yes    Attends Archivist Meetings: Never    Marital Status: Married  Human resources officer Violence: Not At Risk (08/27/2022)   Humiliation, Afraid, Rape, and Kick questionnaire    Fear of Current or Ex-Partner: No    Emotionally  Abused: No    Physically Abused: No    Sexually Abused: No    FAMILY HISTORY: Family History  Problem Relation Age of Onset   Breast cancer Mother    Emphysema Father    Colon cancer Maternal Grandmother     ALLERGIES:  has No Known Allergies.  MEDICATIONS:  Current Outpatient Medications  Medication Sig Dispense Refill   albuterol (VENTOLIN HFA) 108 (90 Base) MCG/ACT inhaler Inhale 2 puffs into the lungs every 4 (four) hours as needed. (Patient taking differently: Inhale 2 puffs into the lungs every 4 (four) hours as needed for wheezing or shortness of breath.) 18 g 1   amLODipine (NORVASC) 5 MG tablet Take 1 tablet (5 mg total) by mouth daily. 90 tablet 3   glipiZIDE (GLUCOTROL) 10 MG tablet Take 1 tablet (10 mg total) by mouth 2 (two) times daily before a meal. (Patient taking differently: Take 10 mg by mouth daily before breakfast.) 180 tablet 3   glucose blood test strip 1 each by Other route as needed for other. accu chek aviva plusUse as instructed     magnesium oxide (MAG-OX) 400 (240 Mg) MG tablet Take 400 mg by mouth in the morning.     metFORMIN (GLUCOPHAGE) 500 MG tablet TAKE TWO TABLETS BY MOUTH EVERY MORNING WITH BREAKFAST (Patient taking  differently: Take 1,000 mg by mouth daily with breakfast.) 180 tablet 0   pravastatin (PRAVACHOL) 20 MG tablet Take 1 tablet (20 mg total) by mouth daily. 90 tablet 3   tadalafil (CIALIS) 20 MG tablet Take 0.5-1 tablets (10-20 mg total) by mouth every other day as needed for erectile dysfunction. 20 tablet 11   triamcinolone cream (KENALOG) 0.1 % Apply 1 Application topically 3 (three) times daily. (Patient taking differently: Apply 1 Application topically 3 (three) times daily as needed (for bilateral, below-the-knees skin irritation).) 453.6 g 1   warfarin (COUMADIN) 5 MG tablet TAKE 1 TABLET BY MOUTH DAILY EXCEPT TAKE 1 1/2 TABLETS ON SUNDAYS, WEDNESDAYS, AND FRIDAYS OR AS DIRECTED BY ANTICOAGULATION CLINIC (Patient not taking: Reported on 08/30/2022) 135 tablet 2   benzonatate (TESSALON) 200 MG capsule Take 1 capsule (200 mg total) by mouth 3 (three) times daily as needed for cough. (Patient not taking: Reported on 08/22/2022) 30 capsule 3   enoxaparin (LOVENOX) 120 MG/0.8ML injection Inject 0.8 mLs (120 mg total) into the skin every 12 (twelve) hours for 28 days. 44.8 mL 0   No current facility-administered medications for this visit.     PHYSICAL EXAMINATION: ECOG PERFORMANCE STATUS: 1 - Symptomatic but completely ambulatory Vitals:   08/27/22 0928  BP: (!) 152/59  Pulse: 83  Resp: 18  Temp: 98 F (36.7 C)  SpO2: 96%   Filed Weights   08/27/22 0928  Weight: 292 lb 4.8 oz (132.6 kg)    Physical Exam Constitutional:      General: He is not in acute distress. HENT:     Head: Normocephalic and atraumatic.  Eyes:     General: No scleral icterus. Cardiovascular:     Rate and Rhythm: Normal rate.  Pulmonary:     Effort: Pulmonary effort is normal. No respiratory distress.     Breath sounds: No wheezing.  Abdominal:     General: Bowel sounds are normal. There is no distension.  Musculoskeletal:        General: Normal range of motion.     Cervical back: Normal range of motion  and neck supple.     Right lower leg:  No edema.     Comments: Bilateral chronic lower extremity swelling, varicose veins  Skin:    General: Skin is warm and dry.     Findings: No erythema or rash.  Neurological:     Mental Status: He is alert and oriented to person, place, and time. Mental status is at baseline.     Cranial Nerves: No cranial nerve deficit.  Psychiatric:        Mood and Affect: Mood normal.     LABORATORY DATA:  I have reviewed the data as listed    Latest Ref Rng & Units 08/27/2022   10:33 AM 08/21/2022    7:56 AM 08/20/2022    3:52 AM  CBC  WBC 4.0 - 10.5 K/uL 4.2  4.6  4.1   Hemoglobin 13.0 - 17.0 g/dL 16.6  16.5  16.6   Hematocrit 39.0 - 52.0 % 49.9  49.2  51.4   Platelets 150 - 400 K/uL 210  173  149       Latest Ref Rng & Units 08/27/2022   10:33 AM 08/21/2022    7:56 AM 08/20/2022    3:52 AM  CMP  Glucose 70 - 99 mg/dL 125  132  107   BUN 8 - 23 mg/dL '10  6  8   '$ Creatinine 0.61 - 1.24 mg/dL 0.78  0.69  0.65   Sodium 135 - 145 mmol/L 138  135  137   Potassium 3.5 - 5.1 mmol/L 4.5  4.0  3.6   Chloride 98 - 111 mmol/L 104  103  104   CO2 22 - 32 mmol/L '28  24  22   '$ Calcium 8.9 - 10.3 mg/dL 8.7  8.4  8.2   Total Protein 6.5 - 8.1 g/dL 6.7  6.1  5.9   Total Bilirubin 0.3 - 1.2 mg/dL 0.7  1.2  0.9   Alkaline Phos 38 - 126 U/L 66  64  63   AST 15 - 41 U/L '30  29  25   '$ ALT 0 - 44 U/L '28  27  23     '$ RADIOGRAPHIC STUDIES: I have personally reviewed the radiological images as listed and agreed with the findings in the report. US BIOPSY (LIVER)  Result Date: 08/20/2022 INDICATION: Multifocal liver masses EXAM: ULTRASOUND GUIDED LIVER LESION BIOPSY COMPARISON:  MR abdomen, 08/19/2022.  CT AP, 08/18/2022. MEDICATIONS: None ANESTHESIA/SEDATION: Moderate (conscious) sedation was employed during this procedure. A total of Versed 4 mg and Fentanyl 100 mcg was administered intravenously. Moderate Sedation Time: 21 minutes. The patient's level of consciousness  and vital signs were monitored continuously by radiology nursing throughout the procedure under my direct supervision. COMPLICATIONS: None immediate. PROCEDURE: Informed written consent was obtained from the patient and/or patient's representative after a discussion of the risks, benefits and alternatives to treatment. The patient understands and consents the procedure. A timeout was performed prior to the initiation of the procedure. Ultrasound scanning was performed of the right upper abdominal quadrant demonstrates large RIGHT hepatic lobe mass The RIGHT hepatic lobe mass was selected for biopsy and the procedure was planned. The right upper abdominal quadrant was prepped and draped in the usual sterile fashion. The overlying soft tissues were anesthetized with 1% lidocaine with epinephrine. A 17 gauge, 6.8 cm co-axial needle was advanced into a peripheral aspect of the lesion. This was followed by 3 core biopsies with an 18 gauge core device under direct ultrasound guidance. The coaxial needle tract was embolized with a small amount of  Gel-Foam slurry and superficial hemostasis was obtained with manual compression. Post procedural scanning was negative for definitive area of hemorrhage or additional complication. A dressing was placed. The patient tolerated the procedure well without immediate post procedural complication. IMPRESSION: Successful ultrasound guided core needle biopsy of liver mass, as above. Michaelle Birks, MD [facility] Electronically Signed   By: Michaelle Birks M.D.   On: 08/20/2022 14:27   DG Chest 2 View  Result Date: 08/20/2022 CLINICAL DATA:  Pneumonia. EXAM: CHEST - 2 VIEW COMPARISON:  July 19, 2022. FINDINGS: The heart size and mediastinal contours are within normal limits. Stable probable bibasilar scarring is noted. No definite acute abnormality is noted. The visualized skeletal structures are unremarkable. IMPRESSION: No  definite active cardiopulmonary disease. Electronically Signed   By: Marijo Conception M.D.   On: 08/20/2022 13:46   ECHOCARDIOGRAM COMPLETE  Result Date: 08/19/2022    ECHOCARDIOGRAM REPORT   Patient Name:   Christopher Marsh Date of Exam: 08/19/2022 Medical Rec #:  [id number]        Height:       75.0 in Accession #:    [id number]       Weight:       287.0 lb Date of Birth:  [birth year]         BSA:          2.558 m Patient Age:    24 years         BP:           164/75 mmHg Patient Gender: M                HR:           71 bpm. Exam Location:  Inpatient Procedure: 2D Echo and Intracardiac Opacification Agent Indications:    CHF  History:        Patient has no prior history of Echocardiogram examinations.                 Risk Factors:Hypertension, Diabetes and Dyslipidemia.  Sonographer:    Harvie Junior Referring Phys: [phone] RIPUDEEP K RAI  Sonographer Comments: Technically difficult study due to poor echo windows and patient is obese. Image acquisition challenging due to patient body habitus and Image acquisition challenging due to respiratory motion. IMPRESSIONS  1. Left ventricular ejection fraction, by estimation, is 60 to 65%. The left ventricle has normal function. The left ventricle has no regional wall motion abnormalities. Left ventricular diastolic parameters were normal.  2. Right ventricular systolic function is normal. The right ventricular size is mildly enlarged. Tricuspid regurgitation signal is inadequate for assessing PA pressure.  3. No valvular stenoses or regurgitation noted. FINDINGS  Left Ventricle: Left ventricular ejection fraction, by estimation, is 60 to 65%. The left ventricle has normal function. The left ventricle has no regional wall motion abnormalities. The left ventricular internal cavity size was normal in size. There is  no left ventricular hypertrophy. Left ventricular diastolic parameters were normal. Right Ventricle: The right ventricular size is mildly enlarged. Right  vetricular wall thickness was not well visualized. Right ventricular systolic function is normal. Tricuspid regurgitation signal is inadequate for assessing PA pressure. The tricuspid regurgitant velocity is 1.36 m/s, and with an assumed right atrial pressure of 3 mmHg, the estimated right ventricular systolic pressure is 50.0 mmHg. Left Atrium: Left atrial size was normal in size. Right Atrium: Right atrial size was normal in size. Pericardium: There is no evidence of  pericardial effusion. Mitral Valve: The mitral valve is grossly normal. No evidence of mitral valve regurgitation. No evidence of mitral valve stenosis. Tricuspid Valve: The tricuspid valve is not well visualized. Tricuspid valve regurgitation is not demonstrated. No evidence of tricuspid stenosis. Aortic Valve: The aortic valve was not well visualized. Aortic valve regurgitation is not visualized. No aortic stenosis is present. Aortic valve mean gradient measures 7.5 mmHg. Aortic valve peak gradient measures 13.4 mmHg. Aortic valve area, by VTI measures 2.97 cm. Pulmonic Valve: The pulmonic valve was not well visualized. Pulmonic valve regurgitation is not visualized. No evidence of pulmonic stenosis. Aorta: The aortic root is normal in size and structure. Venous: The inferior vena cava was not well visualized. IAS/Shunts: No atrial level shunt detected by color flow Doppler.  LEFT VENTRICLE PLAX 2D LVIDd:         4.20 cm      Diastology LVIDs:         2.90 cm      LV e' medial:    7.40 cm/s LV PW:         1.10 cm      LV E/e' medial:  11.2 LV IVS:        1.10 cm      LV e' lateral:   14.80 cm/s LVOT diam:     2.30 cm      LV E/e' lateral: 5.6 LV SV:         108 LV SV Index:   42 LVOT Area:     4.15 cm  LV Volumes (MOD) LV vol d, MOD A2C: 140.0 ml LV vol d, MOD A4C: 128.0 ml LV vol s, MOD A2C: 71.6 ml LV vol s, MOD A4C: 50.4 ml LV SV MOD A2C:     68.4 ml LV SV MOD A4C:     128.0 ml LV SV MOD BP:      74.4 ml RIGHT VENTRICLE RV Basal diam:  4.40 cm  RV Mid diam:    4.20 cm RV S prime:     17.30 cm/s TAPSE (M-mode): 3.1 cm LEFT ATRIUM           Index        RIGHT ATRIUM           Index LA diam:      3.50 cm 1.37 cm/m   RA Area:     17.90 cm LA Vol (A4C): 44.8 ml 17.51 ml/m  RA Volume:   52.30 ml  20.44 ml/m  AORTIC VALVE                     PULMONIC VALVE AV Area (Vmax):    2.47 cm      PV Vmax:       0.90 m/s AV Area (Vmean):   2.40 cm      PV Peak grad:  3.2 mmHg AV Area (VTI):     2.97 cm AV Vmax:           183.00 cm/s AV Vmean:          127.000 cm/s AV VTI:            0.362 m AV Peak Grad:      13.4 mmHg AV Mean Grad:      7.5 mmHg LVOT Vmax:         109.00 cm/s LVOT Vmean:        73.300 cm/s LVOT VTI:  0.259 m LVOT/AV VTI ratio: 0.72  AORTA Ao Root diam: 3.90 cm Ao Asc diam:  3.80 cm MITRAL VALVE               TRICUSPID VALVE MV Area (PHT): 3.31 cm    TR Peak grad:   7.4 mmHg MV Decel Time: 229 msec    TR Vmax:        136.00 cm/s MR Peak grad: 8.6 mmHg MR Vmax:      147.00 cm/s  SHUNTS MV E velocity: 82.70 cm/s  Systemic VTI:  0.26 m MV A velocity: 72.40 cm/s  Systemic Diam: 2.30 cm MV E/A ratio:  1.14 Vishnu Priya Mallipeddi Electronically signed by Lorelee Cover Mallipeddi Signature Date/Time: 08/19/2022/1:22:47 PM    Final    MR ABDOMEN W WO CONTRAST  Result Date: 08/19/2022 CLINICAL DATA:  Hepatic mass on recent CT.  Chronic hepatitis-C. EXAM: MRI ABDOMEN WITHOUT AND WITH CONTRAST TECHNIQUE: Multiplanar multisequence MR imaging of the abdomen was performed both before and after the administration of intravenous contrast. CONTRAST:  109m GADAVIST GADOBUTROL 1 MMOL/ML IV SOLN COMPARISON:  CT on 08/18/2022 FINDINGS: Lower chest: Bilateral lower lobe infiltrates show no significant change compared to recent CT. Hepatobiliary: Marked caudate lobe hypertrophy is suspicious for cirrhosis. Numerous masses are seen throughout the right hepatic lobe which show arterial phase hyperenhancement. Largest of these in segment 8 measures 7.7 x 5.7 cm  and shows central areas of necrosis. The other hypervascular masses are smaller, ranging in size from less than 1 cm to 3.6 cm. These findings are highly suspicious for multifocal hepatocellular carcinoma. Gallbladder is unremarkable. No evidence of biliary ductal dilatation. Pancreas:  No mass or inflammatory changes. Spleen:  Within normal limits in size and appearance. Adrenals/Urinary Tract: No suspicious masses identified. Several benign Bosniak category 1 and 2 renal cysts are seen (no followup imaging is recommended) . No evidence of hydronephrosis. Stomach/Bowel: Unremarkable. Vascular/Lymphatic: Mild lymphadenopathy is seen in the gastrohepatic ligament, porta hepatis, and retroperitoneum, with largest index lymph node in the gastrohepatic ligament measuring 1.6 cm short axis. No acute vascular findings. Other:  None. Musculoskeletal:  No suspicious bone lesions identified. IMPRESSION: Marked caudate lobe hypertrophy, highly suspicious for hepatic cirrhosis. Numerous hypervascular masses throughout the right hepatic lobe, highly suspicious for multifocal hepatocellular carcinoma. Recommend correlation with AFP and consider tissue sampling. Mild abdominal lymphadenopathy, with differential diagnosis including metastatic disease and reactive lymphadenopathy in setting of cirrhosis. Bilateral lower lobe infiltrates, as better demonstrated on recent CT. Electronically Signed   By: JMarlaine HindM.D.   On: 08/19/2022 12:54   CT Abdomen Pelvis W Contrast  Result Date: 08/18/2022 CLINICAL DATA:  Acute generalized abdominal pain. EXAM: CT ABDOMEN AND PELVIS WITH CONTRAST TECHNIQUE: Multidetector CT imaging of the abdomen and pelvis was performed using the standard protocol following bolus administration of intravenous contrast. RADIATION DOSE REDUCTION: This exam was performed according to the departmental dose-optimization program which includes automated exposure control, adjustment of the mA and/or kV  according to patient size and/or use of iterative reconstruction technique. CONTRAST:  1041mOMNIPAQUE IOHEXOL 300 MG/ML  SOLN COMPARISON:  August 16, 2022.  October 13, 2018. FINDINGS: Lower chest: Mild bibasilar opacities are noted concerning for atelectasis, infiltrates or possibly scarring. Hepatobiliary: No gallstones or biliary dilatation is noted. There is interval development a 6.5 x 5.2 cm heterogeneously enhancing mass in the right hepatic lobe which was not present on the prior MRI exam of 2019. This is highly concerning for neoplasm or malignancy.  Pancreas: Unremarkable. No pancreatic ductal dilatation or surrounding inflammatory changes. Spleen: Normal in size without focal abnormality. Adrenals/Urinary Tract: Adrenal glands appear normal. Grossly stable multi-septated cystic lesion is seen involving the upper pole the right kidney with peripheral calcifications. No hydronephrosis or renal obstruction is noted. Urinary bladder is unremarkable. Stomach/Bowel: Stomach is within normal limits. Appendix appears normal. No evidence of bowel wall thickening, distention, or inflammatory changes. Vascular/Lymphatic: Aortic atherosclerosis. Mildly enlarged periaortic adenopathy is noted with largest lymph node measuring 12 mm. Mildly enlarged adenopathy is also noted in the gastrohepatic ligament, but this is unchanged compared to prior exam. Reproductive: Prostate is unremarkable. Other: No abdominal wall hernia or abnormality. No abdominopelvic ascites. Musculoskeletal: Status post left total hip arthroplasty. No acute osseous abnormality is noted. IMPRESSION: Interval development of 6.5 x 5.2 cm heterogeneously enhancing mass in right hepatic lobe. This is highly concerning for neoplasm or malignancy, and further evaluation with MRI is recommended. Mildly enlarged periaortic adenopathy is noted, with the largest lymph node measuring 12 mm. Metastatic disease cannot be excluded. Mildly enlarged adenopathy is  also noted in the gastrohepatic ligament, but this is unchanged compared to prior exam. Stable bibasilar lung opacities are noted concerning for inflammation, atelectasis or possibly scarring. Grossly stable multi-septated cystic lesion is seen involving upper pole of right kidney with peripheral calcifications compared to prior exam of 2019, most likely representing benign etiology. Aortic Atherosclerosis (ICD10-I70.0). Electronically Signed   By: Marijo Conception M.D.   On: 08/18/2022 18:18   CT Chest W Contrast  Result Date: 08/18/2022 CLINICAL DATA:  Cough and persistent rales in left lower lobe. Treated for pneumonia. EXAM: CT CHEST WITH CONTRAST TECHNIQUE: Multidetector CT imaging of the chest was performed during intravenous contrast administration. RADIATION DOSE REDUCTION: This exam was performed according to the departmental dose-optimization program which includes automated exposure control, adjustment of the mA and/or kV according to patient size and/or use of iterative reconstruction technique. CONTRAST:  93m ISOVUE-300 IOPAMIDOL (ISOVUE-300) INJECTION 61% COMPARISON:  Chest radiograph 07/19/2022 and chest CT 09/30/2018 FINDINGS: Cardiovascular: Heart size is normal. Coronary artery calcifications. Some calcifications at the aortic root. Normal caliber thoracic aorta. Enlargement of the main pulmonary artery measuring 3.9 cm. Mediastinum/Nodes: Multiple thyroid nodules. The left thyroid lobe is markedly enlarged measuring up to 3.5 cm. Patient has known thyroid nodules with previous thyroid ultrasound. Please refer to the previous thyroid ultrasound report. Multiple small mediastinal and hilar lymph nodes are again noted. Overall, the lymph nodes have slightly enlarged in size since 2019. Right hilar lymph node on image 104/2 measures 1.0 cm in the short axis and previously measured 0.8 cm. Lungs/Pleura: Trachea and mainstem bronchi are patent. Diffuse airspace disease in the left lower lobe and  lower aspects of the left upper lobe. Parenchymal densities in the posterior right lower lobe and right upper lobe. These parenchymal lung findings are most compatible with multifocal pneumonia. Disease is most prominent in the left lower lobe. No pleural effusions. Upper Abdomen: There is concern for multiple new right hepatic lesions including a poorly defined lesion on image 163/2 measuring up to 5.9 cm. New low-density lesion at the hepatic dome measuring 1.6 cm on image 147/2. Enlargement of a gastrohepatic ligament lymph node on image 166/2 measuring 1.7 cm in the short axis and previously measuring 1.4 cm. Multiple enlarged lymph nodes in the upper abdomen. Again noted is a partially calcified cystic structure involving the right kidney upper pole that is incompletely evaluated. Musculoskeletal: No suspicious osseous findings. IMPRESSION:  1. Multifocal airspace disease in both lungs, left side greater than right. Findings are most compatible with multifocal pneumonia. 2. Multiple new hepatic lesions with enlarging lymph nodes in the upper abdomen and chest. Findings are concerning for metastatic disease. Based on the multifocal pneumonia, hepatic abscesses would also be in the differential diagnosis. Recommend further characterization of the abdomen and pelvis with CT with IV contrast. 3. Enlargement of the main pulmonary artery could be associated with pulmonary hypertension. 4. Coronary artery calcifications. 5. Multinodular goiter. Patient has known thyroid nodules and previous thyroid ultrasound. These results will be called to the ordering clinician or representative by the Radiologist Assistant, and communication documented in the PACS or [employer]. Electronically Signed   By: Markus Daft M.D.   On: 08/18/2022 08:41

## 2022-08-30 NOTE — Assessment & Plan Note (Addendum)
Imaging findings and pathology results were reviewed with patient.  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  Recommend checking molecular testing, PET scan to look for other possible primaries.  Tumor board consensus reached upon that, If additional testing results exclude other primaries, will proceed with chemotherapy treatments for cholangiocarcinoma. Discussed about palliative chemotherapy with gemcitabine, Cisplatin and Keytruda. The diagnosis and care plan were discussed with patient in detail.  The goal of treatment which is to palliate disease, disease related symptoms, improve quality of life and hopefully prolong life was highlighted in our discussion.   I explained to the patient the risks and benefits of chemotherapy including all but not limited to hair loss, mouth sore, nausea, vomiting, diarrhea, hearing loss, kidney failure, low blood counts, bleeding, neuropathy and risk of life threatening infection and even death, secondary malignancy etc.   Discussed the potential side effects of immunotherapy including but not limited to diarrhea; skin rash; respiratory failure, kidney failure, mental status change, elevated LFTs/liver failure,endocrine abnormalities, acute deterioration  and even death,etc. patient voices understanding and agrees with proceeding with treatment.  # Chemotherapy education; refer to surgery Medi- port placement - patient's preference. Refer to ENT for baseline hearing testing.  Antiemetics-Zofran and Compazine; EMLA cream sent to pharmacy  09/13/22 Addendum PET results were reviewed with patient. Bone metastasis, nodal involvement. No obvious other  primaries were found. I recommend to proceed with Gemcitabine and Cisplatin chemotherapy for working diagnosis of cholangiocarcinoma. This regimen also has good coverage for adenocarcinoma of other origins.  I will hold immunotherapy for now due to concern of left lung pre-existing airspace disease, pending pulmonology evaluation.   NGS showed BAP1 FS mutation. I have asked pathology to add melanoma staining.   Left lung airspace process, patient reports no significant worsening of his breathing status. He has been referred to pulmonology and he will call to get appointment scheduled.

## 2022-08-30 NOTE — Assessment & Plan Note (Signed)
#  Recurrent lower extremity DVT, secondary to antiphospholipid syndrome.-.  Persistent lupus anticoagulant positive. Recommend life time anticoagulation with Coumadin with INR goal between 2-3. Should Coumadin clinic to continue to manage Coumadin dosing.

## 2022-08-30 NOTE — Progress Notes (Signed)
   Subjective:    Patient ID: Christopher Marsh, male    DOB: 25-Oct-1961, 61 y.o.   MRN: [id number]  HPI Here with his wife for a transitional care visit after a hospital stay from 08-18-22 to 08-21-22 for a multifocal pneumonia. He had been treated as an outpatient for a LLL pneumonia as seen on a CXR  with Azithromycan and Doxycycline. Then to get a better look at this, he had a chest CT which revealed a multifocal pneumonia. This also showed several lesions in the liver which were felt to be either malignancy or infectious emboli. He tested negative for hepatitis C RNA. An ECHO showed an EF of 60-65%. 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 The pneumonia was treated with IV Vantin and Azithromycin, and he was sent home with 3 additional days of antibiotics. He has felt much better since then with less coughing and less SOB. He is still fairly weak however. As for the liver malignancy, he was seen in the hospital by Dr. Earlie Server of Oncology, and Christopher Marsh had a follow up visit with her on 08-27-22. She proposed starting him on chemotherapy for palliation (not cure). Today Scientist, physiological and his wife have lots of questions about how long is his reasonable life expectancy with and without chemotherapy.    Review of Systems  Constitutional:  Positive for fatigue. Negative for fever.  Respiratory:  Positive for cough and shortness of breath. Negative for wheezing.   Cardiovascular: Negative.   Gastrointestinal: Negative.   Genitourinary: Negative.   Neurological: Negative.        Objective:   Physical Exam Constitutional:      Appearance: Normal appearance.  Cardiovascular:     Rate and Rhythm: Normal rate and regular rhythm.     Pulses: Normal pulses.     Heart sounds: Normal heart sounds.  Pulmonary:     Effort: Pulmonary effort is normal.     Breath sounds: No wheezing, rhonchi or rales.     Comments: He still has some crackles at the left base  Abdominal:      General: Abdomen is flat. Bowel sounds are normal. There is no distension.     Palpations: Abdomen is soft. There is no mass.     Tenderness: There is no abdominal tenderness. There is no right CVA tenderness, left CVA tenderness, guarding or rebound.     Hernia: No hernia is present.  Neurological:     Mental Status: He is alert.           Assessment & Plan:  He is recovering from a multifocal pneumonia, and he no longer requires antibiotics. We will refer him to Pulmonology to follow up on this. As for the hepatic cancer, Christopher Marsh says he would just as soon forego any chemotherapy and live the rest of his life the best he can. I told him before he makes such a decision he should meet with Dr. Tasia Catchings again to answer the questions posed above. I assured him that I will continue to help care for him, even when the time comes to go into a Hospice program. He will follow up as needed.  Alysia Penna, MD

## 2022-08-30 NOTE — Assessment & Plan Note (Signed)
Discussed with patient

## 2022-08-30 NOTE — Telephone Encounter (Signed)
Request for [employer] has been submitted via [employer] [facility]  Specimen: [id number]

## 2022-08-31 ENCOUNTER — Telehealth: Payer: Self-pay | Admitting: *Deleted

## 2022-08-31 ENCOUNTER — Inpatient Hospital Stay: Payer: BC Managed Care – PPO | Attending: Oncology

## 2022-08-31 ENCOUNTER — Encounter: Payer: Self-pay | Admitting: Oncology

## 2022-08-31 NOTE — Telephone Encounter (Signed)
FMLA Form completed and sent for physician signature. Form will need to be returned to patient as she has not signed her section so I will not fax it to her employer

## 2022-08-31 NOTE — Telephone Encounter (Signed)
Received FMLA form for patient wife Marliss Coots

## 2022-09-02 ENCOUNTER — Other Ambulatory Visit: Payer: Self-pay

## 2022-09-02 ENCOUNTER — Encounter: Payer: Self-pay | Admitting: Family Medicine

## 2022-09-04 ENCOUNTER — Encounter: Payer: Self-pay | Admitting: Oncology

## 2022-09-04 NOTE — Telephone Encounter (Signed)
FMLA form completed and I gave original copy yo Verlon Au, RN, OCN- Navigator to contact patient wife to come get it as she needs to complete her part of the form

## 2022-09-06 ENCOUNTER — Other Ambulatory Visit: Payer: Self-pay

## 2022-09-10 DIAGNOSIS — C221 Intrahepatic bile duct carcinoma: Secondary | ICD-10-CM | POA: Diagnosis not present

## 2022-09-11 ENCOUNTER — Encounter: Payer: Self-pay | Admitting: Oncology

## 2022-09-11 ENCOUNTER — Encounter (HOSPITAL_COMMUNITY): Payer: Self-pay | Admitting: Oncology

## 2022-09-11 NOTE — Telephone Encounter (Signed)
Results scanned in Media.

## 2022-09-12 ENCOUNTER — Ambulatory Visit
Admission: RE | Admit: 2022-09-12 | Discharge: 2022-09-12 | Disposition: A | Discharge status: Home / Self Care | Payer: BC Managed Care – PPO | Source: Ambulatory Visit | Attending: Oncology | Admitting: Oncology

## 2022-09-12 ENCOUNTER — Telehealth: Payer: Self-pay

## 2022-09-12 DIAGNOSIS — I700 Atherosclerosis of aorta: Secondary | ICD-10-CM | POA: Insufficient documentation

## 2022-09-12 DIAGNOSIS — C221 Intrahepatic bile duct carcinoma: Secondary | ICD-10-CM | POA: Diagnosis not present

## 2022-09-12 DIAGNOSIS — R590 Localized enlarged lymph nodes: Secondary | ICD-10-CM | POA: Insufficient documentation

## 2022-09-12 LAB — GLUCOSE, CAPILLARY: Glucose-Capillary: 115 mg/dL — ABNORMAL HIGH (ref 70–99)

## 2022-09-12 MED ORDER — FLUDEOXYGLUCOSE F - 18 (FDG) INJECTION
15.3600 | Freq: Once | INTRAVENOUS | Status: AC | PRN
  Administered 2022-09-12: 15.36 via INTRAVENOUS

## 2022-09-12 NOTE — Telephone Encounter (Signed)
Called [facility] to follow up on hearing test referral. Receptionist confirmed that referral was received andt they will contact pt to set up appt

## 2022-09-13 ENCOUNTER — Other Ambulatory Visit: Payer: Self-pay | Admitting: Oncology

## 2022-09-13 ENCOUNTER — Encounter: Payer: Self-pay | Admitting: Oncology

## 2022-09-13 DIAGNOSIS — C221 Intrahepatic bile duct carcinoma: Secondary | ICD-10-CM

## 2022-09-13 MED ORDER — ONDANSETRON HCL 8 MG PO TABS: 8.0000 mg | ORAL_TABLET | Freq: Three times a day (TID) | ORAL | 1 refills | Status: DC | PRN

## 2022-09-13 MED ORDER — DEXAMETHASONE 4 MG PO TABS: ORAL_TABLET | ORAL | 1 refills | Status: DC

## 2022-09-13 MED ORDER — PROCHLORPERAZINE MALEATE 10 MG PO TABS: 10.0000 mg | ORAL_TABLET | Freq: Four times a day (QID) | ORAL | 1 refills | Status: DC | PRN

## 2022-09-13 NOTE — Telephone Encounter (Signed)
Received message from [facility] stating that they have contacted pt to schedule hearing test, but pt refuesed to schedule at this time. Per Shirlean Mylar, pt stated that he's trying to process a lot and did not want to schedule at the moment. '

## 2022-09-13 NOTE — Addendum Note (Signed)
Addended by: Earlie Server on: 09/13/2022 03:34 PM   Modules accepted: Orders

## 2022-09-14 ENCOUNTER — Encounter: Payer: Self-pay | Admitting: Oncology

## 2022-09-15 ENCOUNTER — Inpatient Hospital Stay (HOSPITAL_COMMUNITY)
Admission: EM | Admit: 2022-09-15 | Discharge: 2022-09-17 | DRG: 194 | Disposition: A | Discharge status: Home / Self Care | Payer: BC Managed Care – PPO | Source: Ambulatory Visit | Attending: Internal Medicine | Admitting: Internal Medicine

## 2022-09-15 ENCOUNTER — Encounter: Payer: Self-pay | Admitting: Emergency Medicine

## 2022-09-15 ENCOUNTER — Inpatient Hospital Stay (HOSPITAL_COMMUNITY): Payer: BC Managed Care – PPO

## 2022-09-15 ENCOUNTER — Ambulatory Visit
Admission: EM | Admit: 2022-09-15 | Discharge: 2022-09-15 | Disposition: A | Discharge status: Home / Self Care | Payer: BC Managed Care – PPO | Attending: Family Medicine | Admitting: Family Medicine

## 2022-09-15 ENCOUNTER — Encounter (HOSPITAL_COMMUNITY): Payer: Self-pay | Admitting: Internal Medicine

## 2022-09-15 ENCOUNTER — Other Ambulatory Visit: Payer: Self-pay

## 2022-09-15 ENCOUNTER — Emergency Department (HOSPITAL_COMMUNITY): Payer: BC Managed Care – PPO

## 2022-09-15 DIAGNOSIS — R0902 Hypoxemia: Secondary | ICD-10-CM | POA: Diagnosis not present

## 2022-09-15 DIAGNOSIS — E785 Hyperlipidemia, unspecified: Secondary | ICD-10-CM | POA: Diagnosis not present

## 2022-09-15 DIAGNOSIS — Z96642 Presence of left artificial hip joint: Secondary | ICD-10-CM | POA: Diagnosis present

## 2022-09-15 DIAGNOSIS — C7951 Secondary malignant neoplasm of bone: Secondary | ICD-10-CM | POA: Diagnosis not present

## 2022-09-15 DIAGNOSIS — D6862 Lupus anticoagulant syndrome: Secondary | ICD-10-CM | POA: Diagnosis not present

## 2022-09-15 DIAGNOSIS — J189 Pneumonia, unspecified organism: Secondary | ICD-10-CM | POA: Diagnosis not present

## 2022-09-15 DIAGNOSIS — E872 Acidosis, unspecified: Secondary | ICD-10-CM | POA: Diagnosis not present

## 2022-09-15 DIAGNOSIS — C779 Secondary and unspecified malignant neoplasm of lymph node, unspecified: Secondary | ICD-10-CM | POA: Diagnosis not present

## 2022-09-15 DIAGNOSIS — Z7984 Long term (current) use of oral hypoglycemic drugs: Secondary | ICD-10-CM | POA: Diagnosis not present

## 2022-09-15 DIAGNOSIS — E1169 Type 2 diabetes mellitus with other specified complication: Secondary | ICD-10-CM

## 2022-09-15 DIAGNOSIS — Z1152 Encounter for screening for COVID-19: Secondary | ICD-10-CM | POA: Diagnosis not present

## 2022-09-15 DIAGNOSIS — Z7901 Long term (current) use of anticoagulants: Secondary | ICD-10-CM | POA: Diagnosis not present

## 2022-09-15 DIAGNOSIS — B1920 Unspecified viral hepatitis C without hepatic coma: Secondary | ICD-10-CM | POA: Diagnosis not present

## 2022-09-15 DIAGNOSIS — Z79899 Other long term (current) drug therapy: Secondary | ICD-10-CM | POA: Diagnosis not present

## 2022-09-15 DIAGNOSIS — J9601 Acute respiratory failure with hypoxia: Secondary | ICD-10-CM

## 2022-09-15 DIAGNOSIS — Z800 Family history of malignant neoplasm of digestive organs: Secondary | ICD-10-CM

## 2022-09-15 DIAGNOSIS — J188 Other pneumonia, unspecified organism: Secondary | ICD-10-CM | POA: Diagnosis present

## 2022-09-15 DIAGNOSIS — Y95 Nosocomial condition: Secondary | ICD-10-CM | POA: Diagnosis present

## 2022-09-15 DIAGNOSIS — R059 Cough, unspecified: Secondary | ICD-10-CM | POA: Diagnosis not present

## 2022-09-15 DIAGNOSIS — Z8505 Personal history of malignant neoplasm of liver: Secondary | ICD-10-CM

## 2022-09-15 DIAGNOSIS — E119 Type 2 diabetes mellitus without complications: Secondary | ICD-10-CM | POA: Diagnosis not present

## 2022-09-15 DIAGNOSIS — Z803 Family history of malignant neoplasm of breast: Secondary | ICD-10-CM

## 2022-09-15 DIAGNOSIS — Z6835 Body mass index (BMI) 35.0-35.9, adult: Secondary | ICD-10-CM

## 2022-09-15 DIAGNOSIS — D6861 Antiphospholipid syndrome: Secondary | ICD-10-CM | POA: Diagnosis present

## 2022-09-15 DIAGNOSIS — Z86718 Personal history of other venous thrombosis and embolism: Secondary | ICD-10-CM

## 2022-09-15 DIAGNOSIS — I10 Essential (primary) hypertension: Secondary | ICD-10-CM | POA: Diagnosis not present

## 2022-09-15 DIAGNOSIS — C221 Intrahepatic bile duct carcinoma: Secondary | ICD-10-CM | POA: Diagnosis present

## 2022-09-15 DIAGNOSIS — E669 Obesity, unspecified: Secondary | ICD-10-CM | POA: Diagnosis not present

## 2022-09-15 DIAGNOSIS — Z87891 Personal history of nicotine dependence: Secondary | ICD-10-CM

## 2022-09-15 DIAGNOSIS — K7689 Other specified diseases of liver: Secondary | ICD-10-CM | POA: Diagnosis not present

## 2022-09-15 DIAGNOSIS — Z825 Family history of asthma and other chronic lower respiratory diseases: Secondary | ICD-10-CM | POA: Diagnosis not present

## 2022-09-15 DIAGNOSIS — Z8619 Personal history of other infectious and parasitic diseases: Secondary | ICD-10-CM | POA: Diagnosis present

## 2022-09-15 DIAGNOSIS — M1990 Unspecified osteoarthritis, unspecified site: Secondary | ICD-10-CM | POA: Diagnosis not present

## 2022-09-15 LAB — CBC WITH DIFFERENTIAL/PLATELET
Abs Immature Granulocytes: 0.01 10*3/uL (ref 0.00–0.07)
Basophils Absolute: 0 10*3/uL (ref 0.0–0.1)
Basophils Relative: 0 %
Eosinophils Absolute: 0 10*3/uL (ref 0.0–0.5)
Eosinophils Relative: 0 %
HCT: 49.8 % (ref 39.0–52.0)
Hemoglobin: 16.5 g/dL (ref 13.0–17.0)
Immature Granulocytes: 0 %
Lymphocytes Relative: 7 %
Lymphs Abs: 0.5 10*3/uL — ABNORMAL LOW (ref 0.7–4.0)
MCH: 31.9 pg (ref 26.0–34.0)
MCHC: 33.1 g/dL (ref 30.0–36.0)
MCV: 96.1 fL (ref 80.0–100.0)
Monocytes Absolute: 0.6 10*3/uL (ref 0.1–1.0)
Monocytes Relative: 9 %
Neutro Abs: 6 10*3/uL (ref 1.7–7.7)
Neutrophils Relative %: 84 %
Platelets: 204 10*3/uL (ref 150–400)
RBC: 5.18 MIL/uL (ref 4.22–5.81)
RDW: 13 % (ref 11.5–15.5)
WBC: 7.2 10*3/uL (ref 4.0–10.5)
nRBC: 0 % (ref 0.0–0.2)

## 2022-09-15 LAB — COMPREHENSIVE METABOLIC PANEL
ALT: 27 U/L (ref 0–44)
AST: 31 U/L (ref 15–41)
Albumin: 3.6 g/dL (ref 3.5–5.0)
Alkaline Phosphatase: 51 U/L (ref 38–126)
Anion gap: 7 (ref 5–15)
BUN: 10 mg/dL (ref 8–23)
CO2: 24 mmol/L (ref 22–32)
Calcium: 8.7 mg/dL — ABNORMAL LOW (ref 8.9–10.3)
Chloride: 106 mmol/L (ref 98–111)
Creatinine, Ser: 0.71 mg/dL (ref 0.61–1.24)
GFR, Estimated: >60 mL/min (ref >60)
Glucose, Bld: 131 mg/dL — ABNORMAL HIGH (ref 70–99)
Potassium: 3.5 mmol/L (ref 3.5–5.1)
Sodium: 137 mmol/L (ref 135–145)
Total Bilirubin: 0.7 mg/dL (ref 0.3–1.2)
Total Protein: 6.5 g/dL (ref 6.5–8.1)

## 2022-09-15 LAB — URINALYSIS, ROUTINE W REFLEX MICROSCOPIC
Bilirubin Urine: NEGATIVE
Glucose, UA: NEGATIVE mg/dL
Hgb urine dipstick: NEGATIVE
Ketones, ur: NEGATIVE mg/dL
Leukocytes,Ua: NEGATIVE
Nitrite: NEGATIVE
Protein, ur: NEGATIVE mg/dL
Specific Gravity, Urine: 1.02 (ref 1.005–1.030)
pH: 5 (ref 5.0–8.0)

## 2022-09-15 LAB — PROTIME-INR
INR: 1 (ref 0.8–1.2)
Prothrombin Time: 13.2 seconds (ref 11.4–15.2)

## 2022-09-15 LAB — TROPONIN I (HIGH SENSITIVITY)
Troponin I (High Sensitivity): 5 ng/L (ref <18)
Troponin I (High Sensitivity): 5 ng/L (ref <18)

## 2022-09-15 LAB — SARS CORONAVIRUS 2 BY RT PCR: SARS Coronavirus 2 by RT PCR: NEGATIVE

## 2022-09-15 LAB — BRAIN NATRIURETIC PEPTIDE: B Natriuretic Peptide: 29.2 pg/mL (ref 0.0–100.0)

## 2022-09-15 LAB — LACTIC ACID, PLASMA
Lactic Acid, Venous: 3.6 mmol/L (ref 0.5–1.9)
Lactic Acid, Venous: 1.6 mmol/L (ref 0.5–1.9)

## 2022-09-15 LAB — GLUCOSE, CAPILLARY: Glucose-Capillary: 145 mg/dL — ABNORMAL HIGH (ref 70–99)

## 2022-09-15 MED ORDER — SODIUM CHLORIDE 0.9 % IV BOLUS
1000.0000 mL | Freq: Once | INTRAVENOUS | Status: AC
  Administered 2022-09-15: 1000 mL via INTRAVENOUS

## 2022-09-15 MED ORDER — VANCOMYCIN HCL 2000 MG/400ML IV SOLN
2000.0000 mg | Freq: Once | INTRAVENOUS | Status: AC
  Administered 2022-09-15: 2000 mg via INTRAVENOUS
  Filled 2022-09-15: qty 400

## 2022-09-15 MED ORDER — SODIUM CHLORIDE 0.9 % IV SOLN
2.0000 g | Freq: Three times a day (TID) | INTRAVENOUS | Status: DC
  Administered 2022-09-15 – 2022-09-17 (×5): 2 g via INTRAVENOUS
  Filled 2022-09-15 (×5): qty 12.5

## 2022-09-15 MED ORDER — IPRATROPIUM-ALBUTEROL 0.5-2.5 (3) MG/3ML IN SOLN
3.0000 mL | Freq: Once | RESPIRATORY_TRACT | Status: AC
  Administered 2022-09-15: 3 mL via RESPIRATORY_TRACT
  Filled 2022-09-15: qty 3

## 2022-09-15 MED ORDER — PRAVASTATIN SODIUM 20 MG PO TABS
20.0000 mg | ORAL_TABLET | Freq: Every day | ORAL | Status: DC
  Administered 2022-09-16 – 2022-09-17 (×2): 20 mg via ORAL
  Filled 2022-09-15 (×2): qty 1

## 2022-09-15 MED ORDER — PSEUDOEPHEDRINE-GUAIFENESIN ER 120-1200 MG PO TB12: 1200.0000 mg | ORAL_TABLET | Freq: Two times a day (BID) | ORAL | Status: DC | PRN

## 2022-09-15 MED ORDER — DM-GUAIFENESIN ER 30-600 MG PO TB12
2.0000 | ORAL_TABLET | Freq: Two times a day (BID) | ORAL | Status: DC | PRN
  Administered 2022-09-15: 2 via ORAL
  Filled 2022-09-15: qty 2

## 2022-09-15 MED ORDER — SODIUM CHLORIDE 0.9 % IV SOLN
2.0000 g | Freq: Once | INTRAVENOUS | Status: AC
  Administered 2022-09-15: 2 g via INTRAVENOUS
  Filled 2022-09-15: qty 12.5

## 2022-09-15 MED ORDER — INSULIN ASPART 100 UNIT/ML IJ SOLN: 0.0000 [IU] | Freq: Every day | INTRAMUSCULAR | Status: DC

## 2022-09-15 MED ORDER — ALBUTEROL SULFATE (2.5 MG/3ML) 0.083% IN NEBU: 2.5000 mg | INHALATION_SOLUTION | RESPIRATORY_TRACT | Status: DC | PRN

## 2022-09-15 MED ORDER — AMLODIPINE BESYLATE 5 MG PO TABS
5.0000 mg | ORAL_TABLET | Freq: Every day | ORAL | Status: DC
  Administered 2022-09-16: 5 mg via ORAL
  Filled 2022-09-15: qty 1

## 2022-09-15 MED ORDER — VANCOMYCIN HCL 1250 MG/250ML IV SOLN
1250.0000 mg | Freq: Two times a day (BID) | INTRAVENOUS | Status: DC
  Administered 2022-09-16 – 2022-09-17 (×3): 1250 mg via INTRAVENOUS
  Filled 2022-09-15 (×3): qty 250

## 2022-09-15 MED ORDER — TRIAMCINOLONE ACETONIDE 0.1 % EX CREA: 1.0000 | TOPICAL_CREAM | Freq: Three times a day (TID) | CUTANEOUS | Status: DC | PRN

## 2022-09-15 MED ORDER — INSULIN ASPART 100 UNIT/ML IJ SOLN
0.0000 [IU] | Freq: Three times a day (TID) | INTRAMUSCULAR | Status: DC
  Administered 2022-09-16 – 2022-09-17 (×2): 2 [IU] via SUBCUTANEOUS

## 2022-09-15 MED ORDER — IOHEXOL 350 MG/ML SOLN
75.0000 mL | Freq: Once | INTRAVENOUS | Status: AC | PRN
  Administered 2022-09-15: 75 mL via INTRAVENOUS

## 2022-09-15 MED ORDER — ENOXAPARIN SODIUM 120 MG/0.8ML IJ SOSY
120.0000 mg | PREFILLED_SYRINGE | Freq: Two times a day (BID) | INTRAMUSCULAR | Status: DC
  Administered 2022-09-15 – 2022-09-17 (×4): 120 mg via SUBCUTANEOUS
  Filled 2022-09-15 (×6): qty 0.8

## 2022-09-15 NOTE — Progress Notes (Signed)
Pharmacy Antibiotic Note  Christopher Marsh is a 61 y.o. male admitted on 09/15/2022 with pneumonia.  Pharmacy has been consulted for vancomycin and cefepime dosing.  Plan: Vancomycin 2000 mg IV x1 in ED then 1250 mg IV q12h ( wt 129.5 kg, CrCl capped at 100 ml/min, AUC 486)  Cefepime 2 gr IV q8h  Monitor for signs and symptoms of bleeding   Height: '6\' 3"'$  (190.5 cm) Weight: 129.5 kg (285 lb 7.9 oz) IBW/kg (Calculated) : 84.5  Temp (24hrs), Avg:98.1 F (36.7 C), Min:97.9 F (36.6 C), Max:98.3 F (36.8 C)  Recent Labs  Lab 09/15/22 1524  WBC 7.2  CREATININE 0.71  LATICACIDVEN 3.6*    Estimated Creatinine Clearance: 137 mL/min (by C-G formula based on SCr of 0.71 mg/dL).    No Known Allergies  Antimicrobials this admission: 11/18 vancomycin >>  11/18 cefepime >>   Dose adjustments this admission:   Microbiology results: 11/18 BCx:     Thank you for allowing pharmacy to be a part of this patient's care.   Royetta Asal, PharmD, BCPS 09/15/2022 7:34 PM

## 2022-09-15 NOTE — ED Triage Notes (Signed)
Patient states that he had PET scan on Wed.  Patient states that his Hematologist called and was told that he still has pneumonia in his left lung.  Patient states that they did call in any antibiotics yesterday. Patient states that he does have a little SOB.

## 2022-09-15 NOTE — ED Provider Notes (Signed)
[facility] DEPT Provider Note   CSN: [id number] Arrival date & time: 09/15/22  1354     History  No chief complaint on file.   Christopher Marsh is a 61 y.o. male.    Patient with a history of cholangiocarcinoma, no current treatment, DVT on Lovenox, diabetes, hypertension presenting with concern for pneumonia.  States he had a PET scan 3 days ago that showed questionable pneumonia to his left lung.  He was told about this the next day but not given any antibiotics.  Has had several days of cough and congestion and dyspnea on exertion.  Denies any chest pain.  Denies any fever.  States cough productive of green and yellow mucus.  Only has shortness of breath when he walks around.  No history of asthma or COPD.  No chest pain.  Leg swelling is at baseline.  No abdominal pain, nausea vomiting or diarrhea.  No chest pain.  He states his oxygen level was 86 patient is normally.  He was urgent care and was found to be saturating 89%.  We seen in urgent care and referred to the ED.  The history is provided by the patient.       Home Medications Prior to Admission medications   Medication Sig Start Date End Date Taking? Authorizing Provider  albuterol (VENTOLIN HFA) 108 (90 Base) MCG/ACT inhaler Inhale 2 puffs into the lungs every 4 (four) hours as needed. Patient taking differently: Inhale 2 puffs into the lungs every 4 (four) hours as needed for wheezing or shortness of breath. 05/11/22   Laurey Morale, MD  amLODipine (NORVASC) 5 MG tablet Take 1 tablet (5 mg total) by mouth daily. 08/08/22   Laurey Morale, MD  benzonatate (TESSALON) 200 MG capsule Take 1 capsule (200 mg total) by mouth 3 (three) times daily as needed for cough. Patient not taking: Reported on 08/22/2022 08/21/22   Rai, Vernelle Emerald, MD  dexamethasone (DECADRON) 4 MG tablet Take 2 tablets daily x 3 days starting the day after cisplatin chemotherapy. Take with food. 09/13/22   Earlie Server, MD   enoxaparin (LOVENOX) 120 MG/0.8ML injection Inject 0.8 mLs (120 mg total) into the skin every 12 (twelve) hours for 28 days. 08/27/22 09/24/22  Earlie Server, MD  glipiZIDE (GLUCOTROL) 10 MG tablet Take 1 tablet (10 mg total) by mouth 2 (two) times daily before a meal. Patient taking differently: Take 10 mg by mouth daily before breakfast. 08/08/22   Laurey Morale, MD  glucose blood test strip 1 each by Other route as needed for other. accu chek aviva plusUse as instructed    [provider]  magnesium oxide (MAG-OX) 400 (240 Mg) MG tablet Take 400 mg by mouth in the morning.    [provider]  metFORMIN (GLUCOPHAGE) 500 MG tablet TAKE TWO TABLETS BY MOUTH EVERY MORNING WITH BREAKFAST Patient taking differently: Take 1,000 mg by mouth daily with breakfast. 08/06/22   Laurey Morale, MD  ondansetron (ZOFRAN) 8 MG tablet Take 1 tablet (8 mg total) by mouth every 8 (eight) hours as needed for nausea or vomiting. Start on the third day after cisplatin. 09/13/22   Earlie Server, MD  pravastatin (PRAVACHOL) 20 MG tablet Take 1 tablet (20 mg total) by mouth daily. 08/08/22   Laurey Morale, MD  prochlorperazine (COMPAZINE) 10 MG tablet Take 1 tablet (10 mg total) by mouth every 6 (six) hours as needed (Nausea or vomiting). 09/13/22   Earlie Server, MD  tadalafil (CIALIS) 20 MG tablet Take 0.5-1 tablets (10-20 mg total) by mouth every other day as needed for erectile dysfunction. 08/08/22   Laurey Morale, MD  triamcinolone cream (KENALOG) 0.1 % Apply 1 Application topically 3 (three) times daily. Patient taking differently: Apply 1 Application topically 3 (three) times daily as needed (for bilateral, below-the-knees skin irritation). 07/03/22   Laurey Morale, MD  warfarin (COUMADIN) 5 MG tablet TAKE 1 TABLET BY MOUTH DAILY EXCEPT TAKE 1 1/2 TABLETS ON SUNDAYS, WEDNESDAYS, AND FRIDAYS OR AS DIRECTED BY ANTICOAGULATION CLINIC Patient not taking: Reported on 08/30/2022 07/25/22   Laurey Morale, MD       Allergies    Patient has no known allergies.    Review of Systems   Review of Systems  Constitutional:  Positive for fatigue and fever.  HENT:  Positive for congestion and rhinorrhea.   Respiratory:  Positive for cough and shortness of breath.   Cardiovascular:  Negative for chest pain.  Gastrointestinal:  Negative for abdominal pain, nausea and vomiting.  Genitourinary:  Negative for dysuria and hematuria.  Musculoskeletal:  Negative for arthralgias and myalgias.  Skin:  Negative for rash.  Neurological:  Negative for dizziness, weakness and headaches.   all other systems are negative except as noted in the HPI and PMH.    Physical Exam Updated Vital Signs BP (!) 177/85   Pulse (!) 101   Temp 98.3 F (36.8 C) (Oral)   Resp 18   Ht '6\' 3"'$  (1.905 m)   Wt 129.5 kg   SpO2 (!) 89%   BMI 35.68 kg/m  Physical Exam Vitals and nursing note reviewed.  Constitutional:      General: He is not in acute distress.    Appearance: He is well-developed. He is not ill-appearing.     Comments: Speaking in full sentences, no distress  HENT:     Head: Normocephalic and atraumatic.     Mouth/Throat:     Pharynx: No oropharyngeal exudate.  Eyes:     Conjunctiva/sclera: Conjunctivae normal.     Pupils: Pupils are equal, round, and reactive to light.  Neck:     Comments: No meningismus. Cardiovascular:     Rate and Rhythm: Normal rate and regular rhythm.     Heart sounds: Normal heart sounds. No murmur heard. Pulmonary:     Effort: Pulmonary effort is normal. No respiratory distress.     Breath sounds: Normal breath sounds.     Comments: Diminished breath sounds bilaterally Abdominal:     Palpations: Abdomen is soft.     Tenderness: There is no abdominal tenderness. There is no guarding or rebound.  Musculoskeletal:        General: No tenderness. Normal range of motion.     Cervical back: Normal range of motion and neck supple.  Skin:    General: Skin is warm.  Neurological:      Mental Status: He is alert and oriented to person, place, and time.     Cranial Nerves: No cranial nerve deficit.     Motor: No abnormal muscle tone.     Coordination: Coordination normal.     Comments: No ataxia on finger to nose bilaterally. No pronator drift. 5/5 strength throughout. CN 2-12 intact.Equal grip strength. Sensation intact.   Psychiatric:        Behavior: Behavior normal.     ED Results / Procedures / Treatments   Labs (all labs ordered are listed, but only abnormal results are displayed) Labs  Reviewed  CBC WITH DIFFERENTIAL/PLATELET - Abnormal; Notable for the following components:      Result Value   Lymphs Abs 0.5 (*)    All other components within normal limits  COMPREHENSIVE METABOLIC PANEL - Abnormal; Notable for the following components:   Glucose, Bld 131 (*)    Calcium 8.7 (*)    All other components within normal limits  LACTIC ACID, PLASMA - Abnormal; Notable for the following components:   Lactic Acid, Venous 3.6 (*)    All other components within normal limits  SARS CORONAVIRUS 2 BY RT PCR  CULTURE, BLOOD (ROUTINE X 2)  CULTURE, BLOOD (ROUTINE X 2)  URINALYSIS, ROUTINE W REFLEX MICROSCOPIC  PROTIME-INR  BRAIN NATRIURETIC PEPTIDE  LACTIC ACID, PLASMA  TROPONIN I (HIGH SENSITIVITY)  TROPONIN I (HIGH SENSITIVITY)    EKG EKG Interpretation  Date/Time:  Saturday September 15 2022 16:07:42 EST Ventricular Rate:  78 PR Interval:  212 QRS Duration: 116 QT Interval:  381 QTC Calculation: 434 R Axis:   85 Text Interpretation: Age not entered, assumed to be  61 years old for purpose of ECG interpretation Sinus rhythm Atrial premature complexes Prolonged PR interval Incomplete right bundle branch block No significant change was found Confirmed by Ezequiel Essex [phone]) on 09/15/2022 4:22:12 PM  Radiology DG Chest 2 View  Result Date: 09/15/2022 CLINICAL DATA:  Recently diagnosed pneumonia with hypoxia EXAM: CHEST - 2 VIEW COMPARISON:  Chest  radiograph dated 08/20/2022, NM PET dated 09/12/2022 FINDINGS: Normal lung volumes. Diffuse bilateral airspace opacities. No definite pleural effusion or pneumothorax. Cardiac silhouette is obscured. The visualized skeletal structures are unremarkable. IMPRESSION: Diffuse bilateral airspace opacities, increased on the right when compared to prior nuclear medicine PET, which may represent multifocal pneumonia or pulmonary edema. Electronically Signed   By: Darrin Nipper M.D.   On: 09/15/2022 16:10    Procedures Procedures    Medications Ordered in ED Medications  ipratropium-albuterol (DUONEB) 0.5-2.5 (3) MG/3ML nebulizer solution 3 mL (has no administration in time range)    ED Course/ Medical Decision Making/ A&P                           Medical Decision Making Amount and/or Complexity of Data Reviewed Labs: ordered. Decision-making details documented in ED Course. Radiology: ordered and independent interpretation performed. Decision-making details documented in ED Course. ECG/medicine tests: ordered and independent interpretation performed. Decision-making details documented in ED Course.  Risk Prescription drug management. Decision regarding hospitalization.   Concern for pneumonia with hypoxia.  Has had several days of cough and congestion.  No chest pain.  Some dyspnea on exertion.  Room air saturation 91% with diminished breath sounds.  We will obtain labs, chest x-ray, give bronchodilators.  Patient denies any fever.  Denies any missed Lovenox doses.  X-ray again concerning for multifocal pneumonia versus edema.  Similar admission in October.  Patient to complete course of antibiotics.  Borderline O2 saturation in the low 90s with increased shortness of breath with ambulation.  Considered possible pulmonary edema secondary to CHF but did have echocardiogram last month that showed normal ejection fraction.  Does not take any diuretics at home.  Reports his leg swelling is at  baseline.  Denies any missed doses of Lovenox.  Low suspicion for acute DVT or PE.  Lactate is elevated. IVF given. Consider multifocal pneumonia again of unclear etiology.  Initiate broad-spectrum antibiotics.  Consider possible pulm edema as well but this seems less likely.  Admission d/w Dr. Jonelle Sidle who request CT chest for further evaluation of chest x-ray findings.  Will admit and treat for multifocal pneumonia in setting of lactic acidosis.  Given IV fluids and cultures were obtained.        Final Clinical Impression(s) / ED Diagnoses Final diagnoses:  Multifocal pneumonia  Lactic acidosis    Rx / DC Orders ED Discharge Orders     None         Rashel Okeefe, Annie Main, MD 09/15/22 [phone]

## 2022-09-15 NOTE — ED Triage Notes (Signed)
Pt recently dx with Pneumonia, started antibiotics yesterday, went to UC today, Sats 89% RA, states he was sent here.Pneumonia dx with PET scan Wednesday.

## 2022-09-15 NOTE — ED Notes (Signed)
Pt had no complaints while ambulating. He did not desat, and sats stayed between 90-94% w/o complaints of ShoB. Rancour made aware.

## 2022-09-15 NOTE — Plan of Care (Signed)

## 2022-09-15 NOTE — Progress Notes (Signed)
Connected pt to bedside continuous pulse ox monitor.  ?

## 2022-09-15 NOTE — H&P (Signed)
History and Physical    Patient: Christopher Marsh DDU:[id number] DOB: 02-03-1961 DOA: 09/15/2022 DOS: the patient was seen and examined on 09/15/2022 PCP: Laurey Morale, MD  Patient coming from: Home  Chief Complaint: No chief complaint on file.  HPI: Christopher Marsh is a 61 y.o. male with medical history significant of cholangiocarcinoma, who is not on chemotherapy, antiphospholipid syndrome on Lovenox, diabetes, morbid obesity, history of DVT, hyperlipidemia, essential hypertension, chronic lower extremity stasis edema, osteoarthritis who presented to the ER with complaint of shortness of breath with exertion.  Patient was worried that he may have pneumonia.  He had a similar episode about a month ago which turned out to be pneumonia.  3 days ago he had a PET scan after which he was told that he had pneumonia but was not given any antibiotics.  Patient came to the ER today where he was seen and evaluated.  Chest x-ray showed multifocal pneumonia patient is being admitted for further evaluation and treatment.  Review of Systems: As mentioned in the history of present illness. All other systems reviewed and are negative. Past Medical History:  Diagnosis Date   Antiphospholipid antibody syndrome (HCC)    Arthritis    Diabetes mellitus without complication (HCC)    DVT (deep venous thrombosis) (HCC)    Lt leg   Dyspnea    Elevated lipids    Erythrocytosis    Hyperlipidemia    Hypertension    Lower extremity edema    Multinodular thyroid    Post-thrombotic syndrome    Past Surgical History:  Procedure Laterality Date   COLONOSCOPY WITH PROPOFOL N/A 09/23/2018   Per Dr. Alice Reichert, polyp, repeat in 3 yrs    EYE MUSCLE SURGERY     JOINT REPLACEMENT Left 07/22/2017   TOTAL HIP ARTHROPLASTY Left 07/22/2017   Procedure: TOTAL HIP ARTHROPLASTY;  Surgeon: Dereck Leep, MD;  Location: [facility] ORS;  Service: Orthopedics;  Laterality: Left;   Social History:  reports that he quit smoking about  41 years ago. His smoking use included cigarettes. He has a 3.00 pack-year smoking history. He has never used smokeless tobacco. He reports current alcohol use. He reports that he does not use drugs.  No Known Allergies  Family History  Problem Relation Age of Onset   Breast cancer Mother    Emphysema Father    Colon cancer Maternal Grandmother     Prior to Admission medications   Medication Sig Start Date End Date Taking? Authorizing Provider  albuterol (VENTOLIN HFA) 108 (90 Base) MCG/ACT inhaler Inhale 2 puffs into the lungs every 4 (four) hours as needed. Patient taking differently: Inhale 2 puffs into the lungs See admin instructions. Inhale 2 puffs into the lungs every three to four hours as needed for shortness of breath or wheezing 05/11/22  Yes Laurey Morale, MD  amLODipine (NORVASC) 5 MG tablet Take 1 tablet (5 mg total) by mouth daily. 08/08/22  Yes Laurey Morale, MD  enoxaparin (LOVENOX) 120 MG/0.8ML injection Inject 0.8 mLs (120 mg total) into the skin every 12 (twelve) hours for 28 days. 08/27/22 09/24/22 Yes Earlie Server, MD  glipiZIDE (GLUCOTROL) 10 MG tablet Take 1 tablet (10 mg total) by mouth 2 (two) times daily before a meal. Patient taking differently: Take 10 mg by mouth daily before breakfast. 08/08/22  Yes Laurey Morale, MD  magnesium oxide (MAG-OX) 400 (240 Mg) MG tablet Take 400 mg by mouth in the morning.   Yes [provider]  metFORMIN (GLUCOPHAGE) 500 MG tablet TAKE TWO TABLETS BY MOUTH EVERY MORNING WITH BREAKFAST Patient taking differently: Take 1,000 mg by mouth daily with breakfast. 08/06/22  Yes Laurey Morale, MD  MUCINEX D MAX STRENGTH [phone] MG TB12 Take 1,200 mg by mouth 2 (two) times daily as needed (for congestion or to loosen mucous in the chest).   Yes [provider]  pravastatin (PRAVACHOL) 20 MG tablet Take 1 tablet (20 mg total) by mouth daily. 08/08/22  Yes Laurey Morale, MD  triamcinolone cream (KENALOG) 0.1 % Apply 1  Application topically 3 (three) times daily. Patient taking differently: Apply 1 Application topically 3 (three) times daily as needed (for bilateral, below-the-knees skin irritation). 07/03/22  Yes Laurey Morale, MD  benzonatate (TESSALON) 200 MG capsule Take 1 capsule (200 mg total) by mouth 3 (three) times daily as needed for cough. Patient not taking: Reported on 08/22/2022 08/21/22   Rai, Vernelle Emerald, MD  dexamethasone (DECADRON) 4 MG tablet Take 2 tablets daily x 3 days starting the day after cisplatin chemotherapy. Take with food. Patient not taking: Reported on 09/15/2022 09/13/22   Earlie Server, MD  glucose blood test strip 1 each by Other route as needed for other. accu chek aviva plusUse as instructed    [provider]  ondansetron (ZOFRAN) 8 MG tablet Take 1 tablet (8 mg total) by mouth every 8 (eight) hours as needed for nausea or vomiting. Start on the third day after cisplatin. Patient not taking: Reported on 09/15/2022 09/13/22   Earlie Server, MD  prochlorperazine (COMPAZINE) 10 MG tablet Take 1 tablet (10 mg total) by mouth every 6 (six) hours as needed (Nausea or vomiting). Patient not taking: Reported on 09/15/2022 09/13/22   Earlie Server, MD  tadalafil (CIALIS) 20 MG tablet Take 0.5-1 tablets (10-20 mg total) by mouth every other day as needed for erectile dysfunction. 08/08/22   Laurey Morale, MD  warfarin (COUMADIN) 5 MG tablet TAKE 1 TABLET BY MOUTH DAILY EXCEPT TAKE 1 1/2 TABLETS ON SUNDAYS, WEDNESDAYS, AND FRIDAYS OR AS DIRECTED BY ANTICOAGULATION CLINIC Patient not taking: Reported on 08/30/2022 07/25/22   Laurey Morale, MD    Physical Exam: Vitals:   09/15/22 1800 09/15/22 1818 09/15/22 1857 09/15/22 1900  BP: (!) 149/65   (!) 179/90  Pulse: 92  74 90  Resp: (!) '23  20 20  '$ Temp:  98.3 F (36.8 C) 97.9 F (36.6 C) 98.4 F (36.9 C)  TempSrc:  Oral Oral Oral  SpO2: 92%  94% 92%  Weight:      Height:       Constitutional: Acutely ill looking, NAD, calm,  comfortable Eyes: PERRL, lids and conjunctivae normal ENMT: Mucous membranes are dry. Posterior pharynx clear of any exudate or lesions.Normal dentition.  Neck: normal, supple, no masses, no thyromegaly Respiratory: Coarse breath sounds bilaterally, no wheezing, no crackles. Normal respiratory effort. No accessory muscle use.  Cardiovascular: Regular rate and rhythm, no murmurs / rubs / gallops.  2+ extremity edema. 2+ pedal pulses. No carotid bruits.  Abdomen: no tenderness, no masses palpated. No hepatosplenomegaly. Bowel sounds positive.  Musculoskeletal: Good range of motion, no joint swelling or tenderness, Skin: no rashes, lesions, ulcers. No induration Neurologic: CN 2-12 grossly intact. Sensation intact, DTR normal. Strength 5/5 in all 4.  Psychiatric: Normal judgment and insight. Alert and oriented x 3. Normal mood  Data Reviewed:  Temperature 98 hemoglobin 16.5 platelets 204 blood pressure 179/90, pulse 101 respiratory 23 oxygen sat 89%  on room air.  White count 7.2, calcium is 8.7.  Glucose 131 lactic acid 3.6. Urinalysis essentially negative.  CT angiogram of the chest shows no PE.  No other significant findings.  There is progression of bilateral multifocal opacity groundglass in nature left greater than right findings are concerning for multifocal pneumonia.  Also air-fluid levels in the left lower lobe bronchi worrisome for aspiration.  Mediastinal and hilar lymphadenopathy has increased in size.  Findings compatible with pulmonary artery hypertension.  Assessment and Plan:  #1 multifocal pneumonia: Patient will be admitted and be treated for healthcare associated pneumonia.  He had pneumonia with treatment about a month ago so this qualifies for HCAP.  Initial Vanco and cefepime.  Blood cultures obtained.  No sputum at the moment but if productive we will get sputum cultures as well.  Monitor closely.  Patient is supposed to be following up with pulmonary apparently.  #2  cholangiocarcinoma: Not an active treatment.  Defer to outpatient oncology treatment.  #3 antiphospholipid syndrome: Patient was on warfarin.  Currently on Lovenox weightbase.  We will resume with pharmacy to dose.  #4 type 2 diabetes: Initiate sliding scale insulin.  Hold glipizide and metformin  #5 hyperlipidemia: Continue home regimen.  #6 essential hypertension: On amlodipine.  Continue  #7 history of hepatitis C: Stable.  Continue to monitor    Advance Care Planning:   Code Status: Full Code full code  Consults: None  Family Communication: No family at bedside  Severity of Illness: The appropriate patient status for this patient is INPATIENT. Inpatient status is judged to be reasonable and necessary in order to provide the required intensity of service to ensure the patient's safety. The patient's presenting symptoms, physical exam findings, and initial radiographic and laboratory data in the context of their chronic comorbidities is felt to place them at high risk for further clinical deterioration. Furthermore, it is not anticipated that the patient will be medically stable for discharge from the hospital within 2 midnights of admission.   * I certify that at the point of admission it is my clinical judgment that the patient will require inpatient hospital care spanning beyond 2 midnights from the point of admission due to high intensity of service, high risk for further deterioration and high frequency of surveillance required.*  AuthorBarbette Merino, MD 09/15/2022 8:40 PM  For on call review [email or web address].

## 2022-09-15 NOTE — ED Notes (Signed)
Patient is being discharged from the Urgent Care and sent to the [facility] Emergency Department via private vehicle . Per Dr. Susa Simmonds, patient is in need of higher level of care due to persistent Pneumonia. Patient is aware and verbalizes understanding of plan of care.  Vitals:   09/15/22 1247  BP: (!) 161/75  Pulse: (!) 101  Resp: 16  Temp: 97.9 F (36.6 C)  SpO2: 90%

## 2022-09-15 NOTE — Progress Notes (Signed)
Pharmacy Note   A consult was received from an ED physician for vancomycin per pharmacy dosing.    The patient's profile has been reviewed for ht/wt/allergies/indication/available labs.    A one time order has been placed for vancomycin 2000 mg IV x1 .    Further antibiotics/pharmacy consults should be ordered by admitting physician if indicated.                       Thank you,  Royetta Asal, PharmD, BCPS 09/15/2022 4:47 PM

## 2022-09-15 NOTE — Discharge Instructions (Addendum)
You have been advised to follow up immediately in the emergency department for concerning signs or symptoms as discussed during your visit. If you declined EMS transport, please have a family member take you directly to the ED at this time. Do not delay.   Based on concerns about condition, if you do not follow up in the ED, you may risk poor outcomes including worsening of condition, delayed treatment and potentially life threatening issues. If you have declined to go to the ED at this time, you should call your PCP immediately to set up a follow up appointment.   Go to ED for red flag symptoms, including; fevers you cannot reduce with Tylenol/Motrin, severe headaches, vision changes, numbness/weakness in part of the body, lethargy, confusion, intractable vomiting, severe dehydration, chest pain, breathing difficulty, severe persistent abdominal or pelvic pain, signs of severe infection (increased redness, swelling of an area), feeling faint or passing out, dizziness, etc. You should especially go to the ED for sudden acute worsening of condition if you do not elect to go at this time.    

## 2022-09-15 NOTE — ED Provider Notes (Signed)
[facility]    CSN: [id number] Arrival date & time: 09/15/22  1210      History   Chief Complaint Chief Complaint  Patient presents with   Pneumonia    HPI Christopher Marsh is a 61 y.o. male.   HPI   Christopher Marsh presents for shortness of breath with chest tightness.  States that he feels clammy and the left oxygen saturation kept dropping at home.  Notes that he had a PET scan on Wednesday and had a left sided pneumonia.  Reports he contacted his hematologist/oncologist about it and was referred to pulmonology.  Reports his home oxygen level was 89% then after taking a shower it came up to 91% but with moving around it dropped again to 87%.  He has not yet started immunotherapy for his cancer.  States he has a productive cough that he cannot get rid of.  Denies any loss of consciousness.       Past Medical History:  Diagnosis Date   Antiphospholipid antibody syndrome (HCC)    Arthritis    Diabetes mellitus without complication (HCC)    DVT (deep venous thrombosis) (HCC)    Lt leg   Dyspnea    Elevated lipids    Erythrocytosis    Hyperlipidemia    Hypertension    Lower extremity edema    Multinodular thyroid    Post-thrombotic syndrome     Patient Active Problem List   Diagnosis Date Noted   Goals of care, counseling/discussion 08/30/2022   Antiphospholipid syndrome ([place]) 08/30/2022   Cholangiocarcinoma ([place]) 08/27/2022   Multifocal pneumonia 08/19/2022   Hepatic lesion 08/19/2022   Snoring 08/19/2022   Primary insomnia 08/19/2022   Liver mass, right lobe 08/19/2022   Pneumonia 08/18/2022   Lymphedema 04/20/2022   Anticoagulant long-term use 01/04/2022   Erythrocytosis 12/18/2021   COVID-19 virus infection 09/01/2020   Status post total replacement of hip 07/22/2017   Essential hypertension 08/28/2016   Actinic keratosis 11/30/2015   DM2 (diabetes mellitus, type 2) ([place]) 02/15/2015   Morbid obesity ([place]) 06/18/2013   Left hip pain 04/02/2012    HSV (herpes simplex virus) infection 04/02/2012   CONTACT DERMATITIS&OTHER ECZEMA DUE UNSPEC CAUSE 09/14/2009   ERECTILE DYSFUNCTION 12/08/2008   Venous (peripheral) insufficiency 12/08/2008   History of hepatitis C 12/08/2008   Hyperlipemia 11/24/2008   ESOPHAGITIS, REFLUX 11/18/2008    Past Surgical History:  Procedure Laterality Date   COLONOSCOPY WITH PROPOFOL N/A 09/23/2018   Per Dr. Alice Reichert, polyp, repeat in 3 yrs    EYE MUSCLE SURGERY     JOINT REPLACEMENT Left 07/22/2017   TOTAL HIP ARTHROPLASTY Left 07/22/2017   Procedure: TOTAL HIP ARTHROPLASTY;  Surgeon: Dereck Leep, MD;  Location: [facility] ORS;  Service: Orthopedics;  Laterality: Left;       Home Medications    Prior to Admission medications   Medication Sig Start Date End Date Taking? Authorizing Provider  albuterol (VENTOLIN HFA) 108 (90 Base) MCG/ACT inhaler Inhale 2 puffs into the lungs every 4 (four) hours as needed. Patient taking differently: Inhale 2 puffs into the lungs See admin instructions. Inhale 2 puffs into the lungs every three to four hours as needed for shortness of breath or wheezing 05/11/22   Laurey Morale, MD  amLODipine (NORVASC) 10 MG tablet Take 1 tablet (10 mg total) by mouth daily. 09/18/22   Shelly Coss, MD  benzonatate (TESSALON) 200 MG capsule Take 1 capsule (200 mg total) by mouth 3 (three) times daily as  needed for cough. Patient not taking: Reported on 08/22/2022 08/21/22   Rai, Vernelle Emerald, MD  dexamethasone (DECADRON) 4 MG tablet Take 2 tablets daily x 3 days starting the day after cisplatin chemotherapy. Take with food. Patient not taking: Reported on 09/15/2022 09/13/22   Earlie Server, MD  enoxaparin (LOVENOX) 120 MG/0.8ML injection Inject 0.8 mLs (120 mg total) into the skin every 12 (twelve) hours for 28 days. 08/27/22 09/24/22  Earlie Server, MD  glipiZIDE (GLUCOTROL) 10 MG tablet Take 1 tablet (10 mg total) by mouth 2 (two) times daily before a meal. Patient taking differently: Take 10  mg by mouth daily before breakfast. 08/08/22   Laurey Morale, MD  glucose blood test strip 1 each by Other route as needed for other. accu chek aviva plusUse as instructed    [provider]  levofloxacin (LEVAQUIN) 750 MG tablet Take 1 tablet (750 mg total) by mouth daily for 5 days. 09/17/22 09/22/22  Shelly Coss, MD  magnesium oxide (MAG-OX) 400 (240 Mg) MG tablet Take 400 mg by mouth in the morning.    [provider]  metFORMIN (GLUCOPHAGE) 500 MG tablet TAKE TWO TABLETS BY MOUTH EVERY MORNING WITH BREAKFAST Patient taking differently: Take 1,000 mg by mouth daily with breakfast. 08/06/22   Laurey Morale, MD  MUCINEX D MAX STRENGTH [phone] MG TB12 Take 1,200 mg by mouth 2 (two) times daily as needed (for congestion or to loosen mucous in the chest).    [provider]  ondansetron (ZOFRAN) 8 MG tablet Take 1 tablet (8 mg total) by mouth every 8 (eight) hours as needed for nausea or vomiting. Start on the third day after cisplatin. Patient not taking: Reported on 09/15/2022 09/13/22   Earlie Server, MD  pravastatin (PRAVACHOL) 20 MG tablet Take 1 tablet (20 mg total) by mouth daily. 08/08/22   Laurey Morale, MD  prochlorperazine (COMPAZINE) 10 MG tablet Take 1 tablet (10 mg total) by mouth every 6 (six) hours as needed (Nausea or vomiting). Patient not taking: Reported on 09/15/2022 09/13/22   Earlie Server, MD  tadalafil (CIALIS) 20 MG tablet Take 0.5-1 tablets (10-20 mg total) by mouth every other day as needed for erectile dysfunction. 08/08/22   Laurey Morale, MD  triamcinolone cream (KENALOG) 0.1 % Apply 1 Application topically 3 (three) times daily. Patient taking differently: Apply 1 Application topically 3 (three) times daily as needed (for bilateral, below-the-knees skin irritation). 07/03/22   Laurey Morale, MD    Family History Family History  Problem Relation Age of Onset   Breast cancer Mother    Emphysema Father    Colon cancer Maternal Grandmother      Social History Social History   Tobacco Use   Smoking status: Former    Packs/day: 0.50    Years: 6.00    Total pack years: 3.00    Types: Cigarettes    Quit date: 1982    Years since quitting: 41.9   Smokeless tobacco: Never  Vaping Use   Vaping Use: Never used  Substance Use Topics   Alcohol use: Yes    Comment: 2-3 nights a week   Drug use: No     Allergies   Patient has no known allergies.   Review of Systems Review of Systems: negative unless otherwise stated in HPI.      Physical Exam Triage Vital Signs ED Triage Vitals  Enc Vitals Group     BP 09/15/22 1247 (!) 161/75  Pulse Rate 09/15/22 1247 (!) 101     Resp 09/15/22 1247 16     Temp 09/15/22 1247 97.9 F (36.6 C)     Temp Source 09/15/22 1247 Oral     SpO2 09/15/22 1247 90 %     Weight 09/15/22 1244 285 lb 7.9 oz (129.5 kg)     Height 09/15/22 1244 '6\' 3"'$  (1.905 m)     Head Circumference --      Peak Flow --      Pain Score 09/15/22 1244 0     Pain Loc --      Pain Edu? --      Excl. in [place]? --    No data found.  Updated Vital Signs BP (!) 161/75 (BP Location: Left Arm)   Pulse (!) 101   Temp 97.9 F (36.6 C) (Oral)   Resp 16   Ht '6\' 3"'$  (1.905 m)   Wt 129.5 kg   SpO2 90%   BMI 35.68 kg/m   Visual Acuity Right Eye Distance:   Left Eye Distance:   Bilateral Distance:    Right Eye Near:   Left Eye Near:    Bilateral Near:     Physical Exam GEN:     alert, non-toxic appearing male    HENT:  mucus membranes moist EYES:   pupils equal and reactive, no scleral injection NECK:  normal ROM RESP:  no increased work of breathing, rales and rhonchi bilaterally, no wheezing CVS:   Tachycardic, regular rhythm Skin:   warm and dry, no rash on visible skin    UC Treatments / Results  Labs (all labs ordered are listed, but only abnormal results are displayed) Labs Reviewed - No data to display  EKG   Radiology DG Swallowing Func-Speech Pathology  Result Date:  09/17/2022 Table formatting from the original result was not included. Objective Swallowing Evaluation: Type of Study: Bedside Swallow Evaluation  Patient Details Name: Christopher Marsh MRN: [id number] Date of Birth: 27-Nov-1960 Today's Date: 09/17/2022 Time: SLP Start Time (ACUTE ONLY): 1155 -SLP Stop Time (ACUTE ONLY): 1210 SLP Time Calculation (min) (ACUTE ONLY): 15 min Past Medical History: Past Medical History: Diagnosis Date  Antiphospholipid antibody syndrome ([place])   Arthritis   Diabetes mellitus without complication ([place])   DVT (deep venous thrombosis) ([place])   Lt leg  Dyspnea   Elevated lipids   Erythrocytosis   Hyperlipidemia   Hypertension   Lower extremity edema   Multinodular thyroid   Post-thrombotic syndrome  Past Surgical History: Past Surgical History: Procedure Laterality Date  COLONOSCOPY WITH PROPOFOL N/A 09/23/2018  Per Dr. Alice Reichert, polyp, repeat in 3 yrs   EYE MUSCLE SURGERY    JOINT REPLACEMENT Left 07/22/2017  TOTAL HIP ARTHROPLASTY Left 07/22/2017  Procedure: TOTAL HIP ARTHROPLASTY;  Surgeon: Dereck Leep, MD;  Location: [facility] ORS;  Service: Orthopedics;  Laterality: Left; HPI: Patient is a 61 y.o. male with PMH: cholangiocarcinoma (not on chemotherapy), antiphospholipid syndrome on Lovenox, diabetes, morbid obesity, history of DVT, hyperlipidemia, essential hypertension, chronic lower extremity stasis edema, osteoarthritis who presented to the ER with complaint of shortness of breath with exertion. Patient was worried he had a PNA and he had a similar episode about a month ago and at that time he did have a PNA. He had a PET scan three days prior to current admission and was told he had a PNA. In ED, CXR showed multifocal PNA. SLP swallow evaluation ordered by MD to r/o aspiration PNA.  Subjective: pleasant  Recommendations for follow up therapy are one component of a multi-disciplinary discharge planning process, led by the attending physician.  Recommendations may be updated based on patient  status, additional functional criteria and insurance authorization. Assessment / Plan / Recommendation   09/17/2022  12:20 PM Clinical Impressions Clinical Impression Patient presents with a normal oral, pharyngeal and esophageal phase of swallow as per this MBS. No penetration or aspiration of any barium consistency during any of the phases of swallow was observed. Barium consistencies tested were: thin, 63m tablet and regular solid with pudding barium. Swallow initiation was timely, no pharyngeal residuals post swallows, UES opening normal and cervical esophageal phase swallow normal. Esophageal sweep did not reveal any barium stasis, retrograde movement of barium and 196mbarium tablet transited fully. SLP Visit Diagnosis Dysphagia, unspecified (R13.10) Impact on safety and function No limitations     09/17/2022  12:20 PM Treatment Recommendations Treatment Recommendations No treatment recommended at this time     09/17/2022  12:23 PM Prognosis Prognosis for Safe Diet Advancement Good   09/17/2022  12:20 PM Diet Recommendations SLP Diet Recommendations Regular solids;Thin liquid Liquid Administration via Cup;Straw Medication Administration Whole meds with liquid Postural Changes Seated upright at 90 degrees     09/17/2022  12:20 PM Other Recommendations Oral Care Recommendations Patient independent with oral care;Oral care BID Follow Up Recommendations No SLP follow up Functional Status Assessment Patient has not had a recent decline in their functional status   09/16/2022   5:01 PM Frequency and Duration  Speech Therapy Frequency (ACUTE ONLY) min 1 x/week Treatment Duration 1 week     09/17/2022  12:20 PM [facility]  09/17/2022  12:20 PM [facility]aryngeal Phase [facility]  09/17/2022  12:20 PM Cervical Esophageal Phase  Cervical Esophageal Phase WFL JoSonia BallerMA, CCC-SLP Speech Therapy                      Procedures Procedures (including critical care time)  Medications  Ordered in UC Medications - No data to display  Initial Impression / Assessment and Plan / UC Course  I have reviewed the triage vital signs and the nursing notes.  Pertinent labs & imaging results that were available during my care of the patient were reviewed by me and considered in my medical decision making (see chart for details).       Pt is a [age].o. male who presents for shortness of breath and low oxygen saturations.  He satting at 90% here but reports dropping to the upper 80s at home.  On chart review, he has cholangiocarcinoma and had a PET scan on Wednesday.  Reviewed PET scan from 09/13/2022 that showed progressive left lung pneumonia.  Patient satting 90% here on room air and is tachycardic.  On exam, he has rhonchi and rales diffusely.  Work-up here truncated and pt advised to go to the hospital for IV antibiotics as he is hypoxic with known pneumonia.  He was reluctant but [place] go to [facility] [place].  Contacted the [facility] charge nurse to let them know patient going to arrive by private vehicle.  His son-in-law will drive him to the hospital.   Discussed MDM, treatment plan and plan for follow-up with patient who agrees with plan.     Final Clinical Impressions(s) / UC Diagnoses   Final diagnoses:  Acute respiratory failure with hypoxia ([place]  Discharge Instructions      You have been advised to follow up immediately in the emergency department for concerning signs or symptoms as discussed during your visit. If you declined EMS transport, please have a family member take you directly to the ED at this time. Do not delay.   Based on concerns about condition, if you do not follow up in the ED, you may risk poor outcomes including worsening of condition, delayed treatment and potentially life threatening issues. If you have declined to go to the ED at this time, you should call your PCP immediately to set up a follow up appointment.    Go to ED for red flag symptoms, including; fevers you cannot reduce with Tylenol/Motrin, severe headaches, vision changes, numbness/weakness in part of the body, lethargy, confusion, intractable vomiting, severe dehydration, chest pain, breathing difficulty, severe persistent abdominal or pelvic pain, signs of severe infection (increased redness, swelling of an area), feeling faint or passing out, dizziness, etc. You should especially go to the ED for sudden acute worsening of condition if you do not elect to go at this time.       ED Prescriptions   None    PDMP not reviewed this encounter.   Lyndee Hensen, DO 09/18/22 0818

## 2022-09-16 DIAGNOSIS — J189 Pneumonia, unspecified organism: Secondary | ICD-10-CM | POA: Diagnosis not present

## 2022-09-16 LAB — GLUCOSE, CAPILLARY
Glucose-Capillary: 120 mg/dL — ABNORMAL HIGH (ref 70–99)
Glucose-Capillary: 130 mg/dL — ABNORMAL HIGH (ref 70–99)
Glucose-Capillary: 114 mg/dL — ABNORMAL HIGH (ref 70–99)
Glucose-Capillary: 132 mg/dL — ABNORMAL HIGH (ref 70–99)

## 2022-09-16 LAB — CBC
HCT: 47.3 % (ref 39.0–52.0)
Hemoglobin: 15.4 g/dL (ref 13.0–17.0)
MCH: 31.4 pg (ref 26.0–34.0)
MCHC: 32.6 g/dL (ref 30.0–36.0)
MCV: 96.3 fL (ref 80.0–100.0)
Platelets: 171 10*3/uL (ref 150–400)
RBC: 4.91 MIL/uL (ref 4.22–5.81)
RDW: 13.1 % (ref 11.5–15.5)
WBC: 4.2 10*3/uL (ref 4.0–10.5)
nRBC: 0 % (ref 0.0–0.2)

## 2022-09-16 LAB — COMPREHENSIVE METABOLIC PANEL
ALT: 22 U/L (ref 0–44)
AST: 20 U/L (ref 15–41)
Albumin: 3.1 g/dL — ABNORMAL LOW (ref 3.5–5.0)
Alkaline Phosphatase: 45 U/L (ref 38–126)
Anion gap: 6 (ref 5–15)
BUN: 9 mg/dL (ref 8–23)
CO2: 23 mmol/L (ref 22–32)
Calcium: 8.2 mg/dL — ABNORMAL LOW (ref 8.9–10.3)
Chloride: 107 mmol/L (ref 98–111)
Creatinine, Ser: 0.71 mg/dL (ref 0.61–1.24)
GFR, Estimated: >60 mL/min (ref >60)
Glucose, Bld: 119 mg/dL — ABNORMAL HIGH (ref 70–99)
Potassium: 3.7 mmol/L (ref 3.5–5.1)
Sodium: 136 mmol/L (ref 135–145)
Total Bilirubin: 1.1 mg/dL (ref 0.3–1.2)
Total Protein: 5.6 g/dL — ABNORMAL LOW (ref 6.5–8.1)

## 2022-09-16 LAB — HIV ANTIBODY (ROUTINE TESTING W REFLEX): HIV Screen 4th Generation wRfx: NONREACTIVE

## 2022-09-16 LAB — PROCALCITONIN: Procalcitonin: <0.1 ng/mL

## 2022-09-16 MED ORDER — MELATONIN 3 MG PO TABS
3.0000 mg | ORAL_TABLET | Freq: Once | ORAL | Status: AC
  Administered 2022-09-16: 3 mg via ORAL
  Filled 2022-09-16: qty 1

## 2022-09-16 MED ORDER — ENOXAPARIN SODIUM 150 MG/ML IJ SOSY
130.0000 mg | PREFILLED_SYRINGE | Freq: Two times a day (BID) | INTRAMUSCULAR | Status: DC
  Filled 2022-09-16: qty 0.86

## 2022-09-16 MED ORDER — AMLODIPINE BESYLATE 10 MG PO TABS
10.0000 mg | ORAL_TABLET | Freq: Every day | ORAL | Status: DC
  Administered 2022-09-17: 10 mg via ORAL
  Filled 2022-09-16: qty 1

## 2022-09-16 NOTE — Progress Notes (Addendum)
PROGRESS NOTE  Christopher Marsh  ASN:[id number] DOB: 1960/12/15 DOA: 09/15/2022 PCP: Laurey Morale, MD   Brief Narrative:  Patient is a 61 year old male with history of cholangiocarcinoma, antiphospholipid  syndrome on Coumadin, diabetes type 2, morbid obesity, history of DVT, hyperlipidemia, hypertension, chronic bilateral lower extremity stasis, osteoarthritis who presented here with complaints of shortness of breath with exertion.  History of pneumonia about a month ago.  Chest x-ray on presentation showed multifocal pneumonia, started on antibiotics.  CT angio chest showed progression of bilateral multifocal patchy ground-glass and interstitial opacities, left greater than right,concerning for multifocal pneumonia.  Assessment & Plan:  Principal Problem:   Multifocal pneumonia Active Problems:   Cholangiocarcinoma ([place])   Hyperlipemia   History of hepatitis C   DM2 (diabetes mellitus, type 2) ([place])   Essential hypertension   Antiphospholipid syndrome ([place])  Multifocal pneumonia: Presented with shortness of breath.  Currently requiring room air to 2 L of oxygen by nasal cannula.CT angio chest showed progression of bilateral multifocal patchy ground-glass and interstitial opacities, left greater than right,concerning for multifocal pneumonia.  Continue vancomycin and cefepime for now.  Aspiration is also a possibility.  Speech therapy consult requested Continue aspiration precaution, pulmonary hygiene, use of incentive spirometer. No leukocytosis or fever.  We will check procalcitonin.  Lactate is normal on presentation. We will follow-up cultures. We will try to wean the oxygen to room air  Cholangiocarcinoma:   Follows with Dr. Tasia Catchings.  Recent PET scan showed bony metastasis, nodal involvement.  Oncology planning to proceed with gemcitabine and cisplatin chemotherapy. Due to concern of left lung airspace process, oncology has referred him to pulmonology as well.  History of  antiphospholipid syndrome/recurrent lower extremity DVT: Persistent lupus anticoagulant positive.  On lifelong anticoagulation .  Currently on Lovenox.  He has chronic bilateral lower extremity edema.  Type 2 diabetes: Continue monitoring blood sugars.  Currently on sliding scale insulin.  On glipizide, metformin at home  Hyperlipidemia: Continue home regimen  Hypertension:  On amlodipine 5 mg daily at home, since blood pressure has trended up, will change to 10 mg daily  History of Hep C: Stable.  Outpatient follow-up recommended  Obesity: BMI 35.6         DVT prophylaxis:Lovenox     Code Status: Full Code  Family Communication: Wife at bedside  Patient status: Inpatient  Patient is from : Home  Anticipated discharge to: Home  Estimated DC date: 1 to 2 days   Consultants: None  Procedures:None  Antimicrobials:  Anti-infectives (From admission, onward)    Start     Dose/Rate Route Frequency Ordered Stop   09/16/22 0600  vancomycin (VANCOREADY) IVPB 1250 mg/250 mL        1,250 mg 166.7 mL/hr over 90 Minutes Intravenous Every 12 hours 09/15/22 1935     09/15/22 2200  ceFEPIme (MAXIPIME) 2 g in sodium chloride 0.9 % 100 mL IVPB        2 g 200 mL/hr over 30 Minutes Intravenous Every 8 hours 09/15/22 1935     09/15/22 1700  vancomycin (VANCOREADY) IVPB 2000 mg/400 mL        2,000 mg 200 mL/hr over 120 Minutes Intravenous  Once 09/15/22 1647 09/15/22 1958   09/15/22 1645  ceFEPIme (MAXIPIME) 2 g in sodium chloride 0.9 % 100 mL IVPB        2 g 200 mL/hr over 30 Minutes Intravenous  Once 09/15/22 1630 09/15/22 1738       Subjective: Patient seen  and examined at the bedside today.  Hemodynamically stable.  Sitting at the edge of the bed.  Wife at bedside.  On 2 L of oxygen per minute which was started last night.  Denies any worsening shortness of breath or cough.  Speaking in full sentences.  He says he feels better today.  Expecting discharge home in a day or  2  Objective: Vitals:   09/15/22 2300 09/15/22 2345 09/16/22 0300 09/16/22 0656  BP: (!) 156/69 (!) 156/83 (!) 158/73 (!) 155/71  Pulse: 77 72 72 64  Resp:  18  18  Temp: 98.1 F (36.7 C) 97.7 F (36.5 C) 97.9 F (36.6 C) 97.9 F (36.6 C)  TempSrc: Oral Oral Oral Oral  SpO2: 92% 98% 93% 94%  Weight:      Height:        Intake/Output Summary (Last 24 hours) at 09/16/2022 1103 Last data filed at 09/16/2022 0820 Gross per 24 hour  Intake 480 ml  Output 460 ml  Net 20 ml   Filed Weights   09/15/22 1415  Weight: 129.5 kg    Examination:  General exam: Overall comfortable, not in distress,obese HEENT: PERRL Respiratory system: Bilateral coarse breathing sounds but no obvious  wheezes or crackles  Cardiovascular system: S1 & S2 heard, RRR.  Gastrointestinal system: Abdomen is nondistended, soft and nontender. Central nervous system: Alert and oriented Extremities: B/L LE edema, no clubbing ,no cyanosis Skin: No rashes, no ulcers,no icterus     Data Reviewed: I have personally reviewed following labs and imaging studies  CBC: Recent Labs  Lab 09/15/22 1524 09/16/22 0633  WBC 7.2 4.2  NEUTROABS 6.0  --   HGB 16.5 15.4  HCT 49.8 47.3  MCV 96.1 96.3  PLT 204 163   Basic Metabolic Panel: Recent Labs  Lab 09/15/22 1524 09/16/22 0633  NA 137 136  K 3.5 3.7  CL 106 107  CO2 24 23  GLUCOSE 131* 119*  BUN 10 9  CREATININE 0.71 0.71  CALCIUM 8.7* 8.2*     Recent Results (from the past 240 hour(s))  Blood culture (routine x 2)     Status: None (Preliminary result)   Collection Time: 09/15/22  3:06 PM   Specimen: BLOOD  Result Value Ref Range Status   Specimen Description   Final    BLOOD RIGHT ANTECUBITAL Performed at [facility], [place] [address] [place], [place] [address]    Special Requests   Final    BOTTLES DRAWN AEROBIC AND ANAEROBIC Blood Culture adequate volume Performed at [facility] [facility] [address], [place], [place] [address]    Culture   Final    NO GROWTH < 12 HOURS Performed at [place] [address], [place], [place] [address]    Report Status PENDING  Incomplete  Blood culture (routine x 2)     Status: None (Preliminary result)   Collection Time: 09/15/22  3:24 PM   Specimen: BLOOD  Result Value Ref Range Status   Specimen Description   Final    BLOOD LEFT ANTECUBITAL Performed at [place] [address] [place], [place] [address]    Special Requests   Final    BOTTLES DRAWN AEROBIC AND ANAEROBIC Blood Culture results may not be optimal due to an excessive volume of blood received in culture bottles Performed at [place] [address], [place], [place] [address]    Culture   Final  NO GROWTH < 12 HOURS Performed at [facility] [address], [place], [place] [address]    Report Status PENDING  Incomplete  SARS Coronavirus 2 by RT PCR (hospital order, performed in [facility] hospital lab) *cepheid single result test* Anterior Nasal Swab     Status: None   Collection Time: 09/15/22  4:33 PM   Specimen: Anterior Nasal Swab  Result Value Ref Range Status   SARS Coronavirus 2 by RT PCR NEGATIVE NEGATIVE Final    Comment: (NOTE) SARS-CoV-2 target nucleic acids are NOT DETECTED.  The SARS-CoV-2 RNA is generally detectable in upper and lower respiratory specimens during the acute phase of infection. The lowest concentration of SARS-CoV-2 viral copies this assay can detect is 250 copies / mL. A negative result does not preclude SARS-CoV-2 infection and should not be used as the sole basis for treatment or other patient management decisions.  A negative result may occur with improper specimen collection / handling, submission of specimen other than nasopharyngeal swab, presence of viral mutation(s) within the areas targeted by this assay, and inadequate number of viral copies (<250 copies / mL). A  negative result must be combined with clinical observations, patient history, and epidemiological information.  Fact Sheet for Patients:   https://www.patel.info/  Fact Sheet for Healthcare Providers: https://hall.com/  This test is not yet approved or  cleared by the [place] FDA and has been authorized for detection and/or diagnosis of SARS-CoV-2 by FDA under an Emergency Use Authorization (EUA).  This EUA will remain in effect (meaning this test can be used) for the duration of the COVID-19 declaration under Section 564(b)(1) of the Act, 21 U.S.C. section 360bbb-3(b)(1), unless the authorization is terminated or revoked sooner.  Performed at [facility], [place] [address] [place], [place] [address]      Radiology Studies: CT Angio Chest PE W and/or Wo Contrast  Result Date: 09/15/2022 CLINICAL DATA:  Pneumonia and cough. High probability for PE. Cholangiocarcinoma. EXAM: CT ANGIOGRAPHY CHEST WITH CONTRAST TECHNIQUE: Multidetector CT imaging of the chest was performed using the standard protocol during bolus administration of intravenous contrast. Multiplanar CT image reconstructions and MIPs were obtained to evaluate the vascular anatomy. RADIATION DOSE REDUCTION: This exam was performed according to the departmental dose-optimization program which includes automated exposure control, adjustment of the mA and/or kV according to patient size and/or use of iterative reconstruction technique. CONTRAST:  93m OMNIPAQUE IOHEXOL 350 MG/ML SOLN COMPARISON:  PET-CT 09/12/2022. FINDINGS: Cardiovascular: Heart is mildly enlarged. There is no pericardial effusion. Aorta is normal in size. There is adequate opacification of the pulmonary arteries. There is no evidence for pulmonary embolism. The main pulmonary artery is enlarged compatible with pulmonary artery hypertension. Mediastinum/Nodes: The left thyroid is heterogeneous and  enlarged similar to the prior study. There are diffuse nonenlarged mediastinal and hilar lymph nodes present which have increased in size and number when compared to the prior study. Visualized esophagus is within normal limits. Lungs/Pleura: Bilateral multifocal patchy ground-glass and interstitial opacities are seen in the bilateral upper lobes and lower lobes, left greater than right. Opacities are new in the right lung when compared to the prior exam. There is no pleural effusion or pneumothorax. Air-fluid levels are seen within left lower lobe bronchi, new from prior. No pleural effusion or pneumothorax. Upper Abdomen: Right liver mass grossly unchanged. Complex right renal cystic lesion grossly unchanged. Musculoskeletal: No chest wall abnormality. No acute or significant osseous findings. Review of the MIP images confirms  the above findings. IMPRESSION: 1. No evidence for pulmonary embolism. 2. Progression of bilateral multifocal patchy ground-glass and interstitial opacities, left greater than right. Findings are concerning for multifocal pneumonia. 3. Air-fluid levels in the left lower lobe bronchi worrisome for aspiration. 4. Mediastinal and hilar lymphadenopathy has increased in size and number when compared to the prior study, likely reactive. 5. Cardiomegaly. 6. Findings compatible with pulmonary artery hypertension. 7. Stable right liver mass and complex right renal cystic lesion. 8. Stable enlargement of the left thyroid. Electronically Signed   By: Ronney Asters M.D.   On: 09/15/2022 20:21   DG Chest 2 View  Result Date: 09/15/2022 CLINICAL DATA:  Recently diagnosed pneumonia with hypoxia EXAM: CHEST - 2 VIEW COMPARISON:  Chest radiograph dated 08/20/2022, NM PET dated 09/12/2022 FINDINGS: Normal lung volumes. Diffuse bilateral airspace opacities. No definite pleural effusion or pneumothorax. Cardiac silhouette is obscured. The visualized skeletal structures are unremarkable. IMPRESSION: Diffuse  bilateral airspace opacities, increased on the right when compared to prior nuclear medicine PET, which may represent multifocal pneumonia or pulmonary edema. Electronically Signed   By: Darrin Nipper M.D.   On: 09/15/2022 16:10    Scheduled Meds:  amLODipine  5 mg Oral Daily   enoxaparin (LOVENOX) injection  120 mg Subcutaneous Q12H   insulin aspart  0-15 Units Subcutaneous TID WC   insulin aspart  0-5 Units Subcutaneous QHS   pravastatin  20 mg Oral Daily   Continuous Infusions:  ceFEPime (MAXIPIME) IV 2 g (09/16/22 0518)   vancomycin 1,250 mg (09/16/22 0559)     LOS: 1 day   Shelly Coss, MD [facility] P11/19/2023, 11:03 AM

## 2022-09-16 NOTE — Plan of Care (Signed)

## 2022-09-16 NOTE — Evaluation (Signed)
Clinical/Bedside Swallow Evaluation Patient Details  Name: Christopher Marsh MRN: [id number] Date of Birth: 1961/04/09  Today's Date: 09/16/2022 Time: SLP Start Time (ACUTE ONLY): 10 SLP Stop Time (ACUTE ONLY): 1620 SLP Time Calculation (min) (ACUTE ONLY): 10 min  Past Medical History:  Past Medical History:  Diagnosis Date   Antiphospholipid antibody syndrome ([place])    Arthritis    Diabetes mellitus without complication ([place])    DVT (deep venous thrombosis) ([place])    Lt leg   Dyspnea    Elevated lipids    Erythrocytosis    Hyperlipidemia    Hypertension    Lower extremity edema    Multinodular thyroid    Post-thrombotic syndrome    Past Surgical History:  Past Surgical History:  Procedure Laterality Date   COLONOSCOPY WITH PROPOFOL N/A 09/23/2018   Per Dr. Alice Reichert, polyp, repeat in 3 yrs    EYE MUSCLE SURGERY     JOINT REPLACEMENT Left 07/22/2017   TOTAL HIP ARTHROPLASTY Left 07/22/2017   Procedure: TOTAL HIP ARTHROPLASTY;  Surgeon: Dereck Leep, MD;  Location: [facility] ORS;  Service: Orthopedics;  Laterality: Left;   HPI:  Patient is a 61 y.o. male with PMH: cholangiocarcinoma (not on chemotherapy), antiphospholipid syndrome on Lovenox, diabetes, morbid obesity, history of DVT, hyperlipidemia, essential hypertension, chronic lower extremity stasis edema, osteoarthritis who presented to the ER with complaint of shortness of breath with exertion. Patient was worried he had a PNA and he had a similar episode about a month ago and at that time he did have a PNA. He had a PET scan three days prior to current admission and was told he had a PNA. In ED, CXR showed multifocal PNA. SLP swallow evaluation ordered by MD to r/o aspiration PNA.    Assessment / Plan / Recommendation  Clinical Impression  Patient presents with mild frequency and intensity of overt s/s aspiration/penetration as per this bedside swallow evaluation with a couple instances of throat clear/cough after sips of thin  liquids (water). Patient did report that he has noticed a slight change in his swallow and that he will "swallow the wrong way" but that this happens "once in a blue moon". SLP did not observe any chagnes in patient's vitals or voice. As he had PNA one month ago and has PNA again, SLP recommends objective swallow study (MBS) to r/o aspiration. (this will be completed next 1-2 dates) Patient in agreement and reports that he is aware of aspiration as his brother who passed away ten years ago experienced PO aspiration. SLP Visit Diagnosis: Dysphagia, unspecified (R13.10)    Aspiration Risk  Mild aspiration risk    Diet Recommendation Regular;Thin liquid   Liquid Administration via: Cup;Straw Medication Administration: Whole meds with liquid Supervision: Patient able to self feed Compensations: Slow rate;Small sips/bites Postural Changes: Seated upright at 90 degrees;Remain upright for at least 30 minutes after po intake    Other  Recommendations Oral Care Recommendations: Oral care BID    Recommendations for follow up therapy are one component of a multi-disciplinary discharge planning process, led by the attending physician.  Recommendations may be updated based on patient status, additional functional criteria and insurance authorization.  Follow up Recommendations No SLP follow up      Assistance Recommended at Discharge    Functional Status Assessment Patient has had a recent decline in their functional status and demonstrates the ability to make significant improvements in function in a reasonable and predictable amount of time.  Frequency and Duration min  1 x/week  1 week       Prognosis Prognosis for Safe Diet Advancement: Good      Swallow Study   General Date of Onset: 09/15/22 HPI: Patient is a 61 y.o. male with PMH: cholangiocarcinoma (not on chemotherapy), antiphospholipid syndrome on Lovenox, diabetes, morbid obesity, history of DVT, hyperlipidemia, essential  hypertension, chronic lower extremity stasis edema, osteoarthritis who presented to the ER with complaint of shortness of breath with exertion. Patient was worried he had a PNA and he had a similar episode about a month ago and at that time he did have a PNA. He had a PET scan three days prior to current admission and was told he had a PNA. In ED, CXR showed multifocal PNA. SLP swallow evaluation ordered by MD to r/o aspiration PNA. Type of Study: Bedside Swallow Evaluation Previous Swallow Assessment: none found Diet Prior to this Study: Regular;Thin liquids Temperature Spikes Noted: No Respiratory Status: Room air History of Recent Intubation: No Behavior/Cognition: Alert;Cooperative;Pleasant mood Oral Cavity Assessment: Within Functional Limits Oral Care Completed by SLP: No Oral Cavity - Dentition: Adequate natural dentition Vision: Functional for self-feeding Self-Feeding Abilities: Able to feed self Patient Positioning: Upright in bed Baseline Vocal Quality: Normal Volitional Cough: Strong Volitional Swallow: Able to elicit    Oral/Motor/Sensory Function Overall Oral Motor/Sensory Function: Within functional limits   Ice Chips     Thin Liquid Thin Liquid: Impaired Presentation: Straw Pharyngeal  Phase Impairments: Other (comments) Other Comments: mild amount of throat clearing    Nectar Thick     Honey Thick     Puree Puree: Not tested   Solid     Solid: Not tested     Sonia Baller, MA, CCC-SLP Speech Therapy

## 2022-09-17 ENCOUNTER — Inpatient Hospital Stay (HOSPITAL_COMMUNITY): Payer: BC Managed Care – PPO

## 2022-09-17 ENCOUNTER — Encounter (HOSPITAL_COMMUNITY): Payer: Self-pay | Admitting: Internal Medicine

## 2022-09-17 DIAGNOSIS — J189 Pneumonia, unspecified organism: Secondary | ICD-10-CM | POA: Diagnosis not present

## 2022-09-17 LAB — GLUCOSE, CAPILLARY: Glucose-Capillary: 126 mg/dL — ABNORMAL HIGH (ref 70–99)

## 2022-09-17 MED ORDER — AMLODIPINE BESYLATE 10 MG PO TABS: 10.0000 mg | ORAL_TABLET | Freq: Every day | ORAL | 0 refills | Status: DC

## 2022-09-17 MED ORDER — LEVOFLOXACIN 750 MG PO TABS: 750.0000 mg | ORAL_TABLET | Freq: Every day | ORAL | 0 refills | Status: AC

## 2022-09-17 NOTE — Progress Notes (Signed)
Modified Barium Swallow Progress Note  Patient Details  Name: Christopher Marsh MRN: [id number] Date of Birth: 10/11/1961  Today's Date: 09/17/2022  Modified Barium Swallow completed.  Full report located under Chart Review in the Imaging Section.  Brief recommendations include the following:  Clinical Impression  Patient presents with a normal oral, pharyngeal and esophageal phase of swallow as per this MBS. No penetration or aspiration of any barium consistency during any of the phases of swallow was observed. Barium consistencies tested were: thin, 46m tablet and regular solid with pudding barium. Swallow initiation was timely, no pharyngeal residuals post swallows, UES opening normal and cervical esophageal phase swallow normal. Esophageal sweep did not reveal any barium stasis, retrograde movement of barium and 143mbarium tablet transited fully.   Swallow Evaluation Recommendations       SLP Diet Recommendations: Regular solids;Thin liquid   Liquid Administration via: Cup;Straw   Medication Administration: Whole meds with liquid           Postural Changes: Seated upright at 90 degrees   Oral Care Recommendations: Patient independent with oral care;Oral care BID        JoSonia BallerMA, CCC-SLP Speech Therapy

## 2022-09-17 NOTE — Plan of Care (Signed)
Patient is stable and in no distress, he will discharge home today post Barium swallow. IV line and telemetry will be removed prior to discharge and AVS will be discussed with the patient

## 2022-09-17 NOTE — Progress Notes (Signed)
  Transition of Care (TOC) Screening Note   Patient Details  Name: Christopher Marsh Date of Birth: [birth year]   Transition of Care [facility]) CM/SW Contact:    Vassie Moselle, [place] Phone Number: 09/17/2022, 8:56 AM    Transition of Care Department [facility]) has reviewed patient and no TOC needs have been identified at this time. We will continue to monitor patient advancement through interdisciplinary progression rounds. If new patient transition needs arise, please place a TOC consult.

## 2022-09-17 NOTE — Progress Notes (Signed)
AVS reviewed with the patient, he acknowledged understanding. No further needs at this time, pt awaiting transport home

## 2022-09-17 NOTE — Discharge Summary (Signed)
Physician Discharge Summary  Christopher Marsh XHB:[id number] DOB: 1961-04-11 DOA: 09/15/2022  PCP: Christopher Morale, MD  Admit date: 09/15/2022 Discharge date: 09/17/2022  Admitted From: Home Disposition:  Home  Discharge Condition:Stable CODE STATUS:FULL Diet recommendation: Heart Healthy  Brief/Interim Summary: Patient is a 61 year old male with history of cholangiocarcinoma, antiphospholipid  syndrome , diabetes type 2, morbid obesity, history of DVT, hyperlipidemia, hypertension, chronic bilateral lower extremity stasis, osteoarthritis who presented here with complaints of shortness of breath with exertion.  History of pneumonia about a month ago.  Chest x-ray on presentation showed multifocal pneumonia, started on antibiotics.  CT angio chest showed progression of bilateral multifocal patchy ground-glass and interstitial opacities, left greater than right,concerning for multifocal pneumonia.  Patient's hospital course remained stable.  He was started on broad spectrum antibiotics with significant improvement.  Currently is not requiring oxygen, maintaining saturation on room air.  Medically stable for discharge home today with oral antibiotics.  Following problems were addressed during his hospitalization:  Multifocal pneumonia: Presented with shortness of breath.  Currently requiring room air to 2 L of oxygen by nasal cannula.CT angio chest showed progression of bilateral multifocal patchy ground-glass and interstitial opacities, left greater than right,concerning for multifocal pneumonia.  Started on vancomycin and cefepime. Speech therapy consult done without finding of any aspiration. No leukocytosis or fever.  negative procalcitonin.  Lactate is normal on presentation. Cultures have remained so far negative.  Antibiotics changed to Levaquin which will continue for 5 more days  Cholangiocarcinoma:   Follows with Dr. Tasia Marsh.  Recent PET scan showed bony metastasis, nodal involvement.  Oncology  planning to proceed with gemcitabine and cisplatin chemotherapy. Due to concern of left lung airspace process, oncology has referred him to pulmonology as well.   History of antiphospholipid syndrome/recurrent lower extremity DVT: Persistent lupus anticoagulant positive.  On lifelong anticoagulation .  Currently on Lovenox.  He has chronic bilateral lower extremity edema.   Type 2 diabetes: Continue monitoring blood sugars. On glipizide, metformin at home   Hyperlipidemia: Continue home regimen   Hypertension:  On amlodipine 5 mg daily at home, since blood pressure has trended up, will change to 10 mg daily   History of Hep C: Stable.  Outpatient follow-up recommended   Obesity: BMI 35.6    Discharge Diagnoses:  Principal Problem:   Multifocal pneumonia Active Problems:   Cholangiocarcinoma ([place])   Hyperlipemia   History of hepatitis C   DM2 (diabetes mellitus, type 2) ([place])   Essential hypertension   Antiphospholipid syndrome [facility])    Discharge Instructions  Discharge Instructions     Diet - low sodium heart healthy   Complete by: As directed    Discharge instructions   Complete by: As directed    1)Please take prescribed medications as instructed 2)Follow up with your PCP and oncologist.   Increase activity slowly   Complete by: As directed       Allergies as of 09/17/2022   No Known Allergies      Medication List     STOP taking these medications    warfarin 5 MG tablet Commonly known as: COUMADIN       TAKE these medications    albuterol 108 (90 Base) MCG/ACT inhaler Commonly known as: VENTOLIN HFA Inhale 2 puffs into the lungs every 4 (four) hours as needed. What changed:  when to take this additional instructions   amLODipine 10 MG tablet Commonly known as: NORVASC Take 1 tablet (10 mg total) by mouth daily. Start  taking on: September 18, 2022 What changed:  medication strength how much to take   benzonatate 200 MG capsule Commonly  known as: TESSALON Take 1 capsule (200 mg total) by mouth 3 (three) times daily as needed for cough.   dexamethasone 4 MG tablet Commonly known as: DECADRON Take 2 tablets daily x 3 days starting the day after cisplatin chemotherapy. Take with food.   enoxaparin 120 MG/0.8ML injection Commonly known as: LOVENOX Inject 0.8 mLs (120 mg total) into the skin every 12 (twelve) hours for 28 days.   glipiZIDE 10 MG tablet Commonly known as: GLUCOTROL Take 1 tablet (10 mg total) by mouth 2 (two) times daily before a meal. What changed: when to take this   glucose blood test strip 1 each by Other route as needed for other. accu chek aviva plusUse as instructed   levofloxacin 750 MG tablet Commonly known as: Levaquin Take 1 tablet (750 mg total) by mouth daily for 5 days.   magnesium oxide 400 (240 Mg) MG tablet Commonly known as: MAG-OX Take 400 mg by mouth in the morning.   metFORMIN 500 MG tablet Commonly known as: GLUCOPHAGE TAKE TWO TABLETS BY MOUTH EVERY MORNING WITH BREAKFAST What changed:  how much to take how to take this when to take this additional instructions   Mucinex D Max Strength 819 [id number] MG Tb12 Generic drug: Pseudoephedrine-Guaifenesin Take 1,200 mg by mouth 2 (two) times daily as needed (for congestion or to loosen mucous in the chest).   ondansetron 8 MG tablet Commonly known as: Zofran Take 1 tablet (8 mg total) by mouth every 8 (eight) hours as needed for nausea or vomiting. Start on the third day after cisplatin.   pravastatin 20 MG tablet Commonly known as: PRAVACHOL Take 1 tablet (20 mg total) by mouth daily.   prochlorperazine 10 MG tablet Commonly known as: COMPAZINE Take 1 tablet (10 mg total) by mouth every 6 (six) hours as needed (Nausea or vomiting).   tadalafil 20 MG tablet Commonly known as: CIALIS Take 0.5-1 tablets (10-20 mg total) by mouth every other day as needed for erectile dysfunction.   triamcinolone cream 0.1 % Commonly known  as: KENALOG Apply 1 Application topically 3 (three) times daily. What changed:  when to take this reasons to take this        Follow-up Information     Christopher Morale, MD. Schedule an appointment as soon as possible for a visit in 1 week(s).   Specialty: Family Medicine Contact information: [address] [place] [address] [phone]                No Known Allergies  Consultations: None   Procedures/Studies: CT Angio Chest PE W and/or Wo Contrast  Result Date: 09/15/2022 CLINICAL DATA:  Pneumonia and cough. High probability for PE. Cholangiocarcinoma. EXAM: CT ANGIOGRAPHY CHEST WITH CONTRAST TECHNIQUE: Multidetector CT imaging of the chest was performed using the standard protocol during bolus administration of intravenous contrast. Multiplanar CT image reconstructions and MIPs were obtained to evaluate the vascular anatomy. RADIATION DOSE REDUCTION: This exam was performed according to the departmental dose-optimization program which includes automated exposure control, adjustment of the mA and/or kV according to patient size and/or use of iterative reconstruction technique. CONTRAST:  77m OMNIPAQUE IOHEXOL 350 MG/ML SOLN COMPARISON:  PET-CT 09/12/2022. FINDINGS: Cardiovascular: Heart is mildly enlarged. There is no pericardial effusion. Aorta is normal in size. There is adequate opacification of the pulmonary arteries. There is no evidence for pulmonary embolism.  The main pulmonary artery is enlarged compatible with pulmonary artery hypertension. Mediastinum/Nodes: The left thyroid is heterogeneous and enlarged similar to the prior study. There are diffuse nonenlarged mediastinal and hilar lymph nodes present which have increased in size and number when compared to the prior study. Visualized esophagus is within normal limits. Lungs/Pleura: Bilateral multifocal patchy ground-glass and interstitial opacities are seen in the bilateral upper lobes and lower lobes,  left greater than right. Opacities are new in the right lung when compared to the prior exam. There is no pleural effusion or pneumothorax. Air-fluid levels are seen within left lower lobe bronchi, new from prior. No pleural effusion or pneumothorax. Upper Abdomen: Right liver mass grossly unchanged. Complex right renal cystic lesion grossly unchanged. Musculoskeletal: No chest wall abnormality. No acute or significant osseous findings. Review of the MIP images confirms the above findings. IMPRESSION: 1. No evidence for pulmonary embolism. 2. Progression of bilateral multifocal patchy ground-glass and interstitial opacities, left greater than right. Findings are concerning for multifocal pneumonia. 3. Air-fluid levels in the left lower lobe bronchi worrisome for aspiration. 4. Mediastinal and hilar lymphadenopathy has increased in size and number when compared to the prior study, likely reactive. 5. Cardiomegaly. 6. Findings compatible with pulmonary artery hypertension. 7. Stable right liver mass and complex right renal cystic lesion. 8. Stable enlargement of the left thyroid. Electronically Signed   By: Ronney Asters M.D.   On: 09/15/2022 20:21   DG Chest 2 View  Result Date: 09/15/2022 CLINICAL DATA:  Recently diagnosed pneumonia with hypoxia EXAM: CHEST - 2 VIEW COMPARISON:  Chest radiograph dated 08/20/2022, NM PET dated 09/12/2022 FINDINGS: Normal lung volumes. Diffuse bilateral airspace opacities. No definite pleural effusion or pneumothorax. Cardiac silhouette is obscured. The visualized skeletal structures are unremarkable. IMPRESSION: Diffuse bilateral airspace opacities, increased on the right when compared to prior nuclear medicine PET, which may represent multifocal pneumonia or pulmonary edema. Electronically Signed   By: Darrin Nipper M.D.   On: 09/15/2022 16:10   NM PET Image Initial (PI) Skull Base To Thigh  Result Date: 09/13/2022 CLINICAL DATA:  Initial treatment strategy for  cholangiocarcinoma. EXAM: NUCLEAR MEDICINE PET SKULL BASE TO THIGH TECHNIQUE: 15.36 mCi F-18 FDG was injected intravenously. Full-ring PET imaging was performed from the skull base to thigh after the radiotracer. CT data was obtained and used for attenuation correction and anatomic localization. Fasting blood glucose: 115 mg/dl COMPARISON:  Foot MRI 08/19/2022 and CT scan 08/18/2022 FINDINGS: Mediastinal blood pool activity: SUV max 1.62 Liver activity: SUV max NA NECK: No hypermetabolic lymph nodes in the neck. Incidental CT findings: Left thyroid goiter but no areas of hypermetabolism. CHEST: No hypermetabolic mediastinal or hilar nodes. No suspicious pulmonary nodules on the CT scan. Incidental CT findings: Significant diffuse airspace process in the left lung progressive since the prior chest CT. Findings could be due to atypical pneumonia, pulmonary hemorrhage or left-sided aspiration. Mild FDG uptake but below that of background mediastinal activity. No pleural effusions. ABDOMEN/PELVIS: Large right hepatic lobe mass is hypermetabolic with SUV max of 7.89 and consistent with known cholangiocarcinoma. This is largely in segment a but does extend in the segment 7. Scattered periportal and celiac axis lymph nodes not demonstrate any hypermetabolism. The largest node in the gastrohepatic ligament measures 19 mm but SUV max is 1.58. Small scattered retroperitoneal lymph nodes are stable. No hypermetabolism. Incidental CT findings: Stable benign bilateral renal cysts as demonstrated on prior MRI. No imaging evaluation or follow-up is necessary. Stable aortic and branch  vessel calcifications. SKELETON: There are 4 hypermetabolic bone lesions noted. Small lesions associated with the right transverse process of T1 has an SUV max of 6.82. Small lytic lesion associated with the base of the left coracoid process has an SUV max of 4.25. Small lytic lesion associated with the right ischial tuberosity has an SUV max of 4.08.  Larger lytic lesion involving the left ischial tuberosity has an SUV max of 3.23. Incidental CT findings: None. IMPRESSION: 1. Large right hepatic lobe mass is hypermetabolic and consistent with known cholangiocarcinoma. 2. Scattered borderline enlarged upper abdominal lymph nodes do not show any significant FDG uptake. 3. 4 hypermetabolic bone lesions consistent with metastatic disease. 4. Progressive diffuse airspace process in the left lung could be due to atypical pneumonia, pulmonary hemorrhage or left-sided aspiration. 5. Aortic atherosclerosis. Aortic Atherosclerosis (ICD10-I70.0). Electronically Signed   By: Marijo Sanes M.D.   On: 09/13/2022 10:27   US BIOPSY (LIVER)  Result Date: 08/20/2022 INDICATION: Multifocal liver masses EXAM: ULTRASOUND GUIDED LIVER LESION BIOPSY COMPARISON:  MR abdomen, 08/19/2022.  CT AP, 08/18/2022. MEDICATIONS: None ANESTHESIA/SEDATION: Moderate (conscious) sedation was employed during this procedure. A total of Versed 4 mg and Fentanyl 100 mcg was administered intravenously. Moderate Sedation Time: 21 minutes. The patient's level of consciousness and vital signs were monitored continuously by radiology nursing throughout the procedure under my direct supervision. COMPLICATIONS: None immediate. PROCEDURE: Informed written consent was obtained from the patient and/or patient's representative after a discussion of the risks, benefits and alternatives to treatment. The patient understands and consents the procedure. A timeout was performed prior to the initiation of the procedure. Ultrasound scanning was performed of the right upper abdominal quadrant demonstrates large RIGHT hepatic lobe mass The RIGHT hepatic lobe mass was selected for biopsy and the procedure was planned. The right upper abdominal quadrant was prepped and draped in the usual sterile fashion. The overlying soft tissues were anesthetized with 1% lidocaine with epinephrine. A 17 gauge, 6.8 cm co-axial needle  was advanced into a peripheral aspect of the lesion. This was followed by 3 core biopsies with an 18 gauge core device under direct ultrasound guidance. The coaxial needle tract was embolized with a small amount of Gel-Foam slurry and superficial hemostasis was obtained with manual compression. Post procedural scanning was negative for definitive area of hemorrhage or additional complication. A dressing was placed. The patient tolerated the procedure well without immediate post procedural complication. IMPRESSION: Successful ultrasound guided core needle biopsy of liver mass, as above. Michaelle Birks, MD [facility] Electronically Signed   By: Michaelle Birks M.D.   On: 08/20/2022 14:27   DG Chest 2 View  Result Date: 08/20/2022 CLINICAL DATA:  Pneumonia. EXAM: CHEST - 2 VIEW COMPARISON:  July 19, 2022. FINDINGS: The heart size and mediastinal contours are within normal limits. Stable probable bibasilar scarring is noted. No definite acute abnormality is noted. The visualized skeletal structures are unremarkable. IMPRESSION: No definite active cardiopulmonary disease. Electronically Signed   By: Marijo Conception M.D.   On: 08/20/2022 13:46   ECHOCARDIOGRAM COMPLETE  Result Date: 08/19/2022    ECHOCARDIOGRAM REPORT   Patient Name:   VIDYUTH BELSITO Date of Exam: 08/19/2022 Medical Rec #:  [id number]        Height:       75.0 in Accession #:    [id number]       Weight:       287.0 lb Date of Birth:  1961/03/14  BSA:          2.558 m Patient Age:    3 years         BP:           164/75 mmHg Patient Gender: M                HR:           71 bpm. Exam Location:  Inpatient Procedure: 2D Echo and Intracardiac Opacification Agent Indications:    CHF  History:        Patient has no prior history of Echocardiogram examinations.                 Risk Factors:Hypertension, Diabetes and Dyslipidemia.  Sonographer:    Harvie Junior Referring Phys: [phone]  RIPUDEEP K RAI  Sonographer Comments: Technically difficult study due to poor echo windows and patient is obese. Image acquisition challenging due to patient body habitus and Image acquisition challenging due to respiratory motion. IMPRESSIONS  1. Left ventricular ejection fraction, by estimation, is 60 to 65%. The left ventricle has normal function. The left ventricle has no regional wall motion abnormalities. Left ventricular diastolic parameters were normal.  2. Right ventricular systolic function is normal. The right ventricular size is mildly enlarged. Tricuspid regurgitation signal is inadequate for assessing PA pressure.  3. No valvular stenoses or regurgitation noted. FINDINGS  Left Ventricle: Left ventricular ejection fraction, by estimation, is 60 to 65%. The left ventricle has normal function. The left ventricle has no regional wall motion abnormalities. The left ventricular internal cavity size was normal in size. There is  no left ventricular hypertrophy. Left ventricular diastolic parameters were normal. Right Ventricle: The right ventricular size is mildly enlarged. Right vetricular wall thickness was not well visualized. Right ventricular systolic function is normal. Tricuspid regurgitation signal is inadequate for assessing PA pressure. The tricuspid regurgitant velocity is 1.36 m/s, and with an assumed right atrial pressure of 3 mmHg, the estimated right ventricular systolic pressure is 64.4 mmHg. Left Atrium: Left atrial size was normal in size. Right Atrium: Right atrial size was normal in size. Pericardium: There is no evidence of pericardial effusion. Mitral Valve: The mitral valve is grossly normal. No evidence of mitral valve regurgitation. No evidence of mitral valve stenosis. Tricuspid Valve: The tricuspid valve is not well visualized. Tricuspid valve regurgitation is not demonstrated. No evidence of tricuspid stenosis. Aortic Valve: The aortic valve was not well visualized. Aortic valve  regurgitation is not visualized. No aortic stenosis is present. Aortic valve mean gradient measures 7.5 mmHg. Aortic valve peak gradient measures 13.4 mmHg. Aortic valve area, by VTI measures 2.97 cm. Pulmonic Valve: The pulmonic valve was not well visualized. Pulmonic valve regurgitation is not visualized. No evidence of pulmonic stenosis. Aorta: The aortic root is normal in size and structure. Venous: The inferior vena cava was not well visualized. IAS/Shunts: No atrial level shunt detected by color flow Doppler.  LEFT VENTRICLE PLAX 2D LVIDd:         4.20 cm      Diastology LVIDs:         2.90 cm      LV e' medial:    7.40 cm/s LV PW:         1.10 cm      LV E/e' medial:  11.2 LV IVS:        1.10 cm      LV e' lateral:   14.80 cm/s LVOT diam:  2.30 cm      LV E/e' lateral: 5.6 LV SV:         108 LV SV Index:   42 LVOT Area:     4.15 cm  LV Volumes (MOD) LV vol d, MOD A2C: 140.0 ml LV vol d, MOD A4C: 128.0 ml LV vol s, MOD A2C: 71.6 ml LV vol s, MOD A4C: 50.4 ml LV SV MOD A2C:     68.4 ml LV SV MOD A4C:     128.0 ml LV SV MOD BP:      74.4 ml RIGHT VENTRICLE RV Basal diam:  4.40 cm RV Mid diam:    4.20 cm RV S prime:     17.30 cm/s TAPSE (M-mode): 3.1 cm LEFT ATRIUM           Index        RIGHT ATRIUM           Index LA diam:      3.50 cm 1.37 cm/m   RA Area:     17.90 cm LA Vol (A4C): 44.8 ml 17.51 ml/m  RA Volume:   52.30 ml  20.44 ml/m  AORTIC VALVE                     PULMONIC VALVE AV Area (Vmax):    2.47 cm      PV Vmax:       0.90 m/s AV Area (Vmean):   2.40 cm      PV Peak grad:  3.2 mmHg AV Area (VTI):     2.97 cm AV Vmax:           183.00 cm/s AV Vmean:          127.000 cm/s AV VTI:            0.362 m AV Peak Grad:      13.4 mmHg AV Mean Grad:      7.5 mmHg LVOT Vmax:         109.00 cm/s LVOT Vmean:        73.300 cm/s LVOT VTI:          0.259 m LVOT/AV VTI ratio: 0.72  AORTA Ao Root diam: 3.90 cm Ao Asc diam:  3.80 cm MITRAL VALVE               TRICUSPID VALVE MV Area (PHT): 3.31 cm    TR  Peak grad:   7.4 mmHg MV Decel Time: 229 msec    TR Vmax:        136.00 cm/s MR Peak grad: 8.6 mmHg MR Vmax:      147.00 cm/s  SHUNTS MV E velocity: 82.70 cm/s  Systemic VTI:  0.26 m MV A velocity: 72.40 cm/s  Systemic Diam: 2.30 cm MV E/A ratio:  1.14 Vishnu Priya Mallipeddi Electronically signed by Lorelee Cover Mallipeddi Signature Date/Time: 08/19/2022/1:22:47 PM    Final    MR ABDOMEN W WO CONTRAST  Result Date: 08/19/2022 CLINICAL DATA:  Hepatic mass on recent CT.  Chronic hepatitis-C. EXAM: MRI ABDOMEN WITHOUT AND WITH CONTRAST TECHNIQUE: Multiplanar multisequence MR imaging of the abdomen was performed both before and after the administration of intravenous contrast. CONTRAST:  1m GADAVIST GADOBUTROL 1 MMOL/ML IV SOLN COMPARISON:  CT on 08/18/2022 FINDINGS: Lower chest: Bilateral lower lobe infiltrates show no significant change compared to recent CT. Hepatobiliary: Marked caudate lobe hypertrophy is suspicious for cirrhosis. Numerous masses are seen throughout the right hepatic lobe which show arterial  phase hyperenhancement. Largest of these in segment 8 measures 7.7 x 5.7 cm and shows central areas of necrosis. The other hypervascular masses are smaller, ranging in size from less than 1 cm to 3.6 cm. These findings are highly suspicious for multifocal hepatocellular carcinoma. Gallbladder is unremarkable. No evidence of biliary ductal dilatation. Pancreas:  No mass or inflammatory changes. Spleen:  Within normal limits in size and appearance. Adrenals/Urinary Tract: No suspicious masses identified. Several benign Bosniak category 1 and 2 renal cysts are seen (no followup imaging is recommended) . No evidence of hydronephrosis. Stomach/Bowel: Unremarkable. Vascular/Lymphatic: Mild lymphadenopathy is seen in the gastrohepatic ligament, porta hepatis, and retroperitoneum, with largest index lymph node in the gastrohepatic ligament measuring 1.6 cm short axis. No acute vascular findings. Other:  None.  Musculoskeletal:  No suspicious bone lesions identified. IMPRESSION: Marked caudate lobe hypertrophy, highly suspicious for hepatic cirrhosis. Numerous hypervascular masses throughout the right hepatic lobe, highly suspicious for multifocal hepatocellular carcinoma. Recommend correlation with AFP and consider tissue sampling. Mild abdominal lymphadenopathy, with differential diagnosis including metastatic disease and reactive lymphadenopathy in setting of cirrhosis. Bilateral lower lobe infiltrates, as better demonstrated on recent CT. Electronically Signed   By: Marlaine Hind M.D.   On: 08/19/2022 12:54   CT Abdomen Pelvis W Contrast  Result Date: 08/18/2022 CLINICAL DATA:  Acute generalized abdominal pain. EXAM: CT ABDOMEN AND PELVIS WITH CONTRAST TECHNIQUE: Multidetector CT imaging of the abdomen and pelvis was performed using the standard protocol following bolus administration of intravenous contrast. RADIATION DOSE REDUCTION: This exam was performed according to the departmental dose-optimization program which includes automated exposure control, adjustment of the mA and/or kV according to patient size and/or use of iterative reconstruction technique. CONTRAST:  135m OMNIPAQUE IOHEXOL 300 MG/ML  SOLN COMPARISON:  August 16, 2022.  October 13, 2018. FINDINGS: Lower chest: Mild bibasilar opacities are noted concerning for atelectasis, infiltrates or possibly scarring. Hepatobiliary: No gallstones or biliary dilatation is noted. There is interval development a 6.5 x 5.2 cm heterogeneously enhancing mass in the right hepatic lobe which was not present on the prior MRI exam of 2019. This is highly concerning for neoplasm or malignancy. Pancreas: Unremarkable. No pancreatic ductal dilatation or surrounding inflammatory changes. Spleen: Normal in size without focal abnormality. Adrenals/Urinary Tract: Adrenal glands appear normal. Grossly stable multi-septated cystic lesion is seen involving the upper pole the  right kidney with peripheral calcifications. No hydronephrosis or renal obstruction is noted. Urinary bladder is unremarkable. Stomach/Bowel: Stomach is within normal limits. Appendix appears normal. No evidence of bowel wall thickening, distention, or inflammatory changes. Vascular/Lymphatic: Aortic atherosclerosis. Mildly enlarged periaortic adenopathy is noted with largest lymph node measuring 12 mm. Mildly enlarged adenopathy is also noted in the gastrohepatic ligament, but this is unchanged compared to prior exam. Reproductive: Prostate is unremarkable. Other: No abdominal wall hernia or abnormality. No abdominopelvic ascites. Musculoskeletal: Status post left total hip arthroplasty. No acute osseous abnormality is noted. IMPRESSION: Interval development of 6.5 x 5.2 cm heterogeneously enhancing mass in right hepatic lobe. This is highly concerning for neoplasm or malignancy, and further evaluation with MRI is recommended. Mildly enlarged periaortic adenopathy is noted, with the largest lymph node measuring 12 mm. Metastatic disease cannot be excluded. Mildly enlarged adenopathy is also noted in the gastrohepatic ligament, but this is unchanged compared to prior exam. Stable bibasilar lung opacities are noted concerning for inflammation, atelectasis or possibly scarring. Grossly stable multi-septated cystic lesion is seen involving upper pole of right kidney with peripheral calcifications compared to  prior exam of 2019, most likely representing benign etiology. Aortic Atherosclerosis (ICD10-I70.0). Electronically Signed   By: Marijo Conception M.D.   On: 08/18/2022 18:18      Subjective: Patient seen and examined at bedside today.  Hemodynamically stable for discharge today.  Discharge Exam: Vitals:   09/16/22 2015 09/17/22 0451  BP: (!) 161/75 (!) 149/72  Pulse: 69 63  Resp: 18 16  Temp: 98.2 F (36.8 C) 97.6 F (36.4 C)  SpO2: 93% 93%   Vitals:   09/16/22 1329 09/16/22 1403 09/16/22 2015  09/17/22 0451  BP: (!) 202/73 (!) 162/61 (!) 161/75 (!) 149/72  Pulse: 70 74 69 63  Resp: '20 20 18 16  '$ Temp: 98.1 F (36.7 C) 98.1 F (36.7 C) 98.2 F (36.8 C) 97.6 F (36.4 C)  TempSrc: Oral Oral  Oral  SpO2: 94% 93% 93% 93%  Weight:      Height:        General: Pt is alert, awake, not in acute distress Cardiovascular: RRR, S1/S2 +, no rubs, no gallops Respiratory: CTA bilaterally, no wheezing, no rhonchi Abdominal: Soft, NT, ND, bowel sounds + Extremities: no edema, no cyanosis    The results of significant diagnostics from this hospitalization (including imaging, microbiology, ancillary and laboratory) are listed below for reference.     Microbiology: Recent Results (from the past 240 hour(s))  Blood culture (routine x 2)     Status: None (Preliminary result)   Collection Time: 09/15/22  3:06 PM   Specimen: BLOOD  Result Value Ref Range Status   Specimen Description   Final    BLOOD RIGHT ANTECUBITAL Performed at [facility] [address], [place], [place] [address]    Special Requests   Final    BOTTLES DRAWN AEROBIC AND ANAEROBIC Blood Culture adequate volume Performed at [facility] [address], [place], [place] [address]    Culture   Final    NO GROWTH 2 DAYS Performed at [facility] [address] [place], [place] [address]    Report Status PENDING  Incomplete  Blood culture (routine x 2)     Status: None (Preliminary result)   Collection Time: 09/15/22  3:24 PM   Specimen: BLOOD  Result Value Ref Range Status   Specimen Description   Final    BLOOD LEFT ANTECUBITAL Performed at [facility] [address], [place], [place] [address]    Special Requests   Final    BOTTLES DRAWN AEROBIC AND ANAEROBIC Blood Culture results may not be optimal due to an excessive volume of blood received in culture bottles Performed at [facility] [address], [place],  [place] [address]    Culture   Final    NO GROWTH 2 DAYS Performed at [facility] [address], [place],  [address]    Report Status PENDING  Incomplete  SARS Coronavirus 2 by RT PCR (hospital order, performed in [facility] hospital lab) *cepheid single result test* Anterior Nasal Swab     Status: None   Collection Time: 09/15/22  4:33 PM   Specimen: Anterior Nasal Swab  Result Value Ref Range Status   SARS Coronavirus 2 by RT PCR NEGATIVE NEGATIVE Final    Comment: (NOTE) SARS-CoV-2 target nucleic acids are NOT DETECTED.  The SARS-CoV-2 RNA is generally detectable in upper and lower respiratory specimens during the acute phase of infection. The lowest concentration of SARS-CoV-2 viral copies this  assay can detect is 250 copies / mL. A negative result does not preclude SARS-CoV-2 infection and should not be used as the sole basis for treatment or other patient management decisions.  A negative result may occur with improper specimen collection / handling, submission of specimen other than nasopharyngeal swab, presence of viral mutation(s) within the areas targeted by this assay, and inadequate number of viral copies (<250 copies / mL). A negative result must be combined with clinical observations, patient history, and epidemiological information.  Fact Sheet for Patients:   https://www.patel.info/  Fact Sheet for Healthcare Providers: https://hall.com/  This test is not yet approved or  cleared by the [place] FDA and has been authorized for detection and/or diagnosis of SARS-CoV-2 by FDA under an Emergency Use Authorization (EUA).  This EUA will remain in effect (meaning this test can be used) for the duration of the COVID-19 declaration under Section 564(b)(1) of the Act, 21 U.S.C. section 360bbb-3(b)(1), unless the authorization is terminated or revoked sooner.  Performed at [facility], [place]  [address], [place], [place] [address]      Labs: BNP (last 3 results) Recent Labs    08/18/22 1734 09/15/22 1645  BNP 102.2* 63.8   Basic Metabolic Panel: Recent Labs  Lab 09/15/22 1524 09/16/22 0633  NA 137 136  K 3.5 3.7  CL 106 107  CO2 24 23  GLUCOSE 131* 119*  BUN 10 9  CREATININE 0.71 0.71  CALCIUM 8.7* 8.2*   Liver Function Tests: Recent Labs  Lab 09/15/22 1524 09/16/22 0633  AST 31 20  ALT 27 22  ALKPHOS 51 45  BILITOT 0.7 1.1  PROT 6.5 5.6*  ALBUMIN 3.6 3.1*   No results for input(s): "LIPASE", "AMYLASE" in the last 168 hours. No results for input(s): "AMMONIA" in the last 168 hours. CBC: Recent Labs  Lab 09/15/22 1524 09/16/22 0633  WBC 7.2 4.2  NEUTROABS 6.0  --   HGB 16.5 15.4  HCT 49.8 47.3  MCV 96.1 96.3  PLT 204 171   Cardiac Enzymes: No results for input(s): "CKTOTAL", "CKMB", "CKMBINDEX", "TROPONINI" in the last 168 hours. BNP: Invalid input(s): "POCBNP" CBG: Recent Labs  Lab 09/16/22 0756 09/16/22 1140 09/16/22 1652 09/16/22 2101 09/17/22 0742  GLUCAP 120* 130* 114* 132* 126*   D-Dimer No results for input(s): "DDIMER" in the last 72 hours. Hgb A1c No results for input(s): "HGBA1C" in the last 72 hours. Lipid Profile No results for input(s): "CHOL", "HDL", "LDLCALC", "TRIG", "CHOLHDL", "LDLDIRECT" in the last 72 hours. Thyroid function studies No results for input(s): "TSH", "T4TOTAL", "T3FREE", "THYROIDAB" in the last 72 hours.  Invalid input(s): "FREET3" Anemia work up No results for input(s): "VITAMINB12", "FOLATE", "FERRITIN", "TIBC", "IRON", "RETICCTPCT" in the last 72 hours. Urinalysis    Component Value Date/Time   COLORURINE YELLOW 09/15/2022 1549   APPEARANCEUR CLEAR 09/15/2022 1549   LABSPEC 1.020 09/15/2022 1549   PHURINE 5.0 09/15/2022 1549   GLUCOSEU NEGATIVE 09/15/2022 1549   HGBUR NEGATIVE 09/15/2022 1549   HGBUR negative 11/23/2009 0844   BILIRUBINUR NEGATIVE 09/15/2022 1549   BILIRUBINUR 1+  11/18/2017 1600   KETONESUR NEGATIVE 09/15/2022 1549   PROTEINUR NEGATIVE 09/15/2022 1549   UROBILINOGEN 4.0 (A) 11/18/2017 1600   UROBILINOGEN 0.2 11/23/2009 0844   NITRITE NEGATIVE 09/15/2022 1549   LEUKOCYTESUR NEGATIVE 09/15/2022 1549   Sepsis Labs Recent Labs  Lab 09/15/22 1524 09/16/22 0633  WBC 7.2 4.2   Microbiology Recent Results (from the past 240 hour(s))  Blood  culture (routine x 2)     Status: None (Preliminary result)   Collection Time: 09/15/22  3:06 PM   Specimen: BLOOD  Result Value Ref Range Status   Specimen Description   Final    BLOOD RIGHT ANTECUBITAL Performed at [facility] [address] [place], [place] [address]    Special Requests   Final    BOTTLES DRAWN AEROBIC AND ANAEROBIC Blood Culture adequate volume Performed at [facility] [address] [place], [place] [address]    Culture   Final    NO GROWTH 2 DAYS Performed at [facility] [address], [place], [place] [address]    Report Status PENDING  Incomplete  Blood culture (routine x 2)     Status: None (Preliminary result)   Collection Time: 09/15/22  3:24 PM   Specimen: BLOOD  Result Value Ref Range Status   Specimen Description   Final    BLOOD LEFT ANTECUBITAL Performed at [facility] [address], [place], [place] [address]    Special Requests   Final    BOTTLES DRAWN AEROBIC AND ANAEROBIC Blood Culture results may not be optimal due to an excessive volume of blood received in culture bottles Performed at [facility] [address], [place], [place] [address]    Culture   Final    NO GROWTH 2 DAYS Performed at [facility] [address] [place], [place] [address]    Report Status PENDING  Incomplete  SARS Coronavirus 2 by RT PCR (hospital order, performed in [facility] hospital lab) *cepheid single result test* Anterior Nasal Swab     Status: None   Collection Time:  09/15/22  4:33 PM   Specimen: Anterior Nasal Swab  Result Value Ref Range Status   SARS Coronavirus 2 by RT PCR NEGATIVE NEGATIVE Final    Comment: (NOTE) SARS-CoV-2 target nucleic acids are NOT DETECTED.  The SARS-CoV-2 RNA is generally detectable in upper and lower respiratory specimens during the acute phase of infection. The lowest concentration of SARS-CoV-2 viral copies this assay can detect is 250 copies / mL. A negative result does not preclude SARS-CoV-2 infection and should not be used as the sole basis for treatment or other patient management decisions.  A negative result may occur with improper specimen collection / handling, submission of specimen other than nasopharyngeal swab, presence of viral mutation(s) within the areas targeted by this assay, and inadequate number of viral copies (<250 copies / mL). A negative result must be combined with clinical observations, patient history, and epidemiological information.  Fact Sheet for Patients:   https://www.patel.info/  Fact Sheet for Healthcare Providers: https://hall.com/  This test is not yet approved or  cleared by the [place] FDA and has been authorized for detection and/or diagnosis of SARS-CoV-2 by FDA under an Emergency Use Authorization (EUA).  This EUA will remain in effect (meaning this test can be used) for the duration of the COVID-19 declaration under Section 564(b)(1) of the Act, 21 U.S.C. section 360bbb-3(b)(1), unless the authorization is terminated or revoked sooner.  Performed at [facility], [place] [address] [place], [place] [address]     Please note: You were cared for by a hospitalist during your hospital stay. Once you are discharged, your primary care physician will handle any further medical issues. Please note that NO REFILLS for any discharge medications will be authorized once you are discharged, as it is imperative  that  you return to your primary care physician (or establish a relationship with a primary care physician if you do not have one) for your post hospital discharge needs so that they can reassess your need for medications and monitor your lab values.    Time coordinating discharge: 40 minutes  SIGNED:   Shelly Coss, MD  [facility] 09/17/2022, 10:53 AM Pager [phone]  If 7PM-7AM, please contact night-coverage [email or web address] Password [email or web address]

## 2022-09-19 ENCOUNTER — Telehealth: Payer: Self-pay

## 2022-09-19 DIAGNOSIS — C221 Intrahepatic bile duct carcinoma: Secondary | ICD-10-CM | POA: Diagnosis not present

## 2022-09-19 DIAGNOSIS — C7951 Secondary malignant neoplasm of bone: Secondary | ICD-10-CM | POA: Diagnosis not present

## 2022-09-19 NOTE — Telephone Encounter (Signed)
Transition Care Management Unsuccessful Follow-up Telephone Call  Date of discharge and from where:  11/20; [facility]   Attempts:  1st Attempt  Reason for unsuccessful TCM follow-up call:  Left voice message

## 2022-09-19 NOTE — Telephone Encounter (Signed)
Transition Care Management Follow-up Telephone Call Date of discharge and from where: 09/17/22; [facility] How have you been since you were released from the hospital? Stable Any questions or concerns? No  Items Reviewed: Did the pt receive and understand the discharge instructions provided? Yes  Medications obtained and verified? Yes  Other? No  Any new allergies since your discharge? No  Dietary orders reviewed? Yes Do you have support at home? Yes   Home Care and Equipment/Supplies: Were home health services ordered? no If so, what is the name of the agency? N/a   Has the agency set up a time to come to the patient's home? not applicable Were any new equipment or medical supplies ordered?  No What is the name of the medical supply agency? N/a  Were you able to get the supplies/equipment? not applicable Do you have any questions related to the use of the equipment or supplies? No  Functional Questionnaire: (I = Independent and D = Dependent) ADLs: I  Bathing/Dressing- I  Meal Prep- I  Eating- I  Maintaining continence- I  Transferring/Ambulation- I  Managing Meds- I  Follow up appointments reviewed:  PCP Hospital f/u appt confirmed?  Patient would like to hold on scheduling at this time due to multiple follow ups with specialists.    [facility] f/u appt confirmed? Yes  Scheduled to see Pulmonology- Dr. Larey Days on 09/26/22 @ 2:30. Are transportation arrangements needed? No  If their condition worsens, is the pt aware to call PCP or go to the Emergency Dept.? Yes Was the patient provided with contact information for the PCP's office or ED? Yes Was to pt encouraged to call back with questions or concerns? Yes

## 2022-09-20 LAB — CULTURE, BLOOD (ROUTINE X 2)
Culture: NO GROWTH
Culture: NO GROWTH
Special Requests: ADEQUATE

## 2022-09-24 ENCOUNTER — Other Ambulatory Visit: Payer: Self-pay | Admitting: Oncology

## 2022-09-25 DIAGNOSIS — C221 Intrahepatic bile duct carcinoma: Secondary | ICD-10-CM | POA: Diagnosis not present

## 2022-09-25 DIAGNOSIS — C229 Malignant neoplasm of liver, not specified as primary or secondary: Secondary | ICD-10-CM | POA: Diagnosis not present

## 2022-09-25 MED ORDER — ENOXAPARIN SODIUM 120 MG/0.8ML IJ SOSY: 120.0000 mg | PREFILLED_SYRINGE | Freq: Two times a day (BID) | INTRAMUSCULAR | 0 refills | Status: DC

## 2022-09-26 ENCOUNTER — Encounter: Payer: Self-pay | Admitting: Family Medicine

## 2022-09-26 ENCOUNTER — Encounter: Payer: Self-pay | Admitting: Pulmonary Disease

## 2022-09-26 ENCOUNTER — Other Ambulatory Visit: Payer: Self-pay

## 2022-09-26 ENCOUNTER — Ambulatory Visit (INDEPENDENT_AMBULATORY_CARE_PROVIDER_SITE_OTHER): Payer: BC Managed Care – PPO | Admitting: Pulmonary Disease

## 2022-09-26 VITALS — BP 132/64 | HR 90 | Ht 75.0 in | Wt 281.0 lb

## 2022-09-26 DIAGNOSIS — J181 Lobar pneumonia, unspecified organism: Secondary | ICD-10-CM

## 2022-09-26 DIAGNOSIS — R053 Chronic cough: Secondary | ICD-10-CM

## 2022-09-26 MED ORDER — ALBUTEROL SULFATE HFA 108 (90 BASE) MCG/ACT IN AERS: 2.0000 | INHALATION_SPRAY | RESPIRATORY_TRACT | 1 refills | Status: DC | PRN

## 2022-09-26 MED ORDER — LEVOFLOXACIN 750 MG PO TABS: 750.0000 mg | ORAL_TABLET | Freq: Every day | ORAL | 0 refills | Status: DC

## 2022-09-26 NOTE — Progress Notes (Signed)
$'@Patient'I$  ID: Christopher Marsh, male    DOB: [birth year], 61 y.o.   MRN: [id number]  Chief Complaint  Patient presents with   Hospitalization Follow-up    Pt is here for hospital follow up for PNA. Pt finished antibiotics on Saturday. Pt is on Albuterol as needed. Pt states he is doing well since getting out of hospital. He states his energy is low but he is doing well. Pt is to start immunotherapy but the doctors are needing to get his lungs cleared up first.     Referring provider: Laurey Morale, MD  HPI:   61 y.o. man whom are seen in hospital follow-up for multifocal infiltrates on chest imaging.  Discharge summary 07/2022 reviewed.  Most recent pulmonary note 07/2022 reviewed.  Discharge summary 08/2022 reviewed.  Patient was admitted to the hospital 07/2022 with malaise, cough, shortness of breath.  Chest x-ray 07/19/2022 my review interpretation reveals bilateral left greater than right multifocal infiltrates.  When compared to 07/2021, left side appears slightly worse but similar, new infiltrates on the right on my review and interpretation.  Patient was admitted to the hospital 07/2022 with malaise, cough, shortness of breath.  This was preceded by a CT chest a couple days prior to my review interpretation reveals scattered nodular groundglass opacities most prominent in the left base but also in the lingula, mild in the right base.  Also revealed liver mass.  This was further work-up revealed metastatic cholangiocarcinoma.  He was treated with antibiotics.  His initial symptoms improved.  However 3 to 4 days later, similar symptoms recurred.  He was again admitted to the hospital 08/2022.  In the interim, he had a PET scan that showed worsening left greater than right groundglass opacities with worsening in the right base on my review and interpretation.  Upon readmission to the hospital 08/2022 CTA PE protocol was obtained that showed no PE but worsening multifocal infiltrates and  certainly worse in the right based on my review and interpretation.  Also developing mild bronchiectasis in the bases.  He was treated with IV antibiotics and completed additional 5 days of p.o. levofloxacin to complete 7-day course of antibiotics.  Again his symptoms have improved.  Breathing feels better.  He had mild hypoxemia upon admission.  No longer present.  He had a swallow evaluation in the hospital 08/2022, barium swallow that was normal.  Questionaires / Pulmonary Flowsheets:   ACT:      No data to display          MMRC:     No data to display          Epworth:      No data to display          Tests:   FENO:  No results found for: "NITRICOXIDE"  PFT:     No data to display          WALK:      No data to display          Imaging: Personally reviewed per EMR and discussion in this note DG Swallowing Func-Speech Pathology  Result Date: 09/17/2022 Table formatting from the original result was not included. Objective Swallowing Evaluation: Type of Study: Bedside Swallow Evaluation  Patient Details Name: Christopher Marsh MRN: [id number] Date of Birth: 1961-05-30 Today's Date: 09/17/2022 Time: SLP Start Time (ACUTE ONLY): 1155 -SLP Stop Time (ACUTE ONLY): 1210 SLP Time Calculation (min) (ACUTE ONLY): 15 min Past Medical History: Past Medical History: Diagnosis  Date  Antiphospholipid antibody syndrome (HCC)   Arthritis   Diabetes mellitus without complication (HCC)   DVT (deep venous thrombosis) (HCC)   Lt leg  Dyspnea   Elevated lipids   Erythrocytosis   Hyperlipidemia   Hypertension   Lower extremity edema   Multinodular thyroid   Post-thrombotic syndrome  Past Surgical History: Past Surgical History: Procedure Laterality Date  COLONOSCOPY WITH PROPOFOL N/A 09/23/2018  Per Dr. Alice Reichert, polyp, repeat in 3 yrs   EYE MUSCLE SURGERY    JOINT REPLACEMENT Left 07/22/2017  TOTAL HIP ARTHROPLASTY Left 07/22/2017  Procedure: TOTAL HIP ARTHROPLASTY;  Surgeon: Dereck Leep, MD;  Location: [facility] ORS;  Service: Orthopedics;  Laterality: Left; HPI: Patient is a 61 y.o. male with PMH: cholangiocarcinoma (not on chemotherapy), antiphospholipid syndrome on Lovenox, diabetes, morbid obesity, history of DVT, hyperlipidemia, essential hypertension, chronic lower extremity stasis edema, osteoarthritis who presented to the ER with complaint of shortness of breath with exertion. Patient was worried he had a PNA and he had a similar episode about a month ago and at that time he did have a PNA. He had a PET scan three days prior to current admission and was told he had a PNA. In ED, CXR showed multifocal PNA. SLP swallow evaluation ordered by MD to r/o aspiration PNA.  Subjective: pleasant  Recommendations for follow up therapy are one component of a multi-disciplinary discharge planning process, led by the attending physician.  Recommendations may be updated based on patient status, additional functional criteria and insurance authorization. Assessment / Plan / Recommendation   09/17/2022  12:20 PM Clinical Impressions Clinical Impression Patient presents with a normal oral, pharyngeal and esophageal phase of swallow as per this MBS. No penetration or aspiration of any barium consistency during any of the phases of swallow was observed. Barium consistencies tested were: thin, 1m tablet and regular solid with pudding barium. Swallow initiation was timely, no pharyngeal residuals post swallows, UES opening normal and cervical esophageal phase swallow normal. Esophageal sweep did not reveal any barium stasis, retrograde movement of barium and 122mbarium tablet transited fully. SLP Visit Diagnosis Dysphagia, unspecified (R13.10) Impact on safety and function No limitations     09/17/2022  12:20 PM Treatment Recommendations Treatment Recommendations No treatment recommended at this time     09/17/2022  12:23 PM Prognosis Prognosis for Safe Diet Advancement Good   09/17/2022  12:20 PM Diet  Recommendations SLP Diet Recommendations Regular solids;Thin liquid Liquid Administration via Cup;Straw Medication Administration Whole meds with liquid Postural Changes Seated upright at 90 degrees     09/17/2022  12:20 PM Other Recommendations Oral Care Recommendations Patient independent with oral care;Oral care BID Follow Up Recommendations No SLP follow up Functional Status Assessment Patient has not had a recent decline in their functional status   09/16/2022   5:01 PM Frequency and Duration  Speech Therapy Frequency (ACUTE ONLY) min 1 x/week Treatment Duration 1 week     09/17/2022  12:20 PM [facility]  09/17/2022  12:20 PM Pharyngeal Phase [facility]  09/17/2022  12:20 PM Cervical Esophageal Phase  Cervical Esophageal Phase WFL JoSonia BallerMA, CCC-SLP Speech Therapy                     CT Angio Chest PE W and/or Wo Contrast  Result Date: 09/15/2022 CLINICAL DATA:  Pneumonia and cough. High probability for PE. Cholangiocarcinoma. EXAM: CT ANGIOGRAPHY CHEST WITH CONTRAST TECHNIQUE: Multidetector  CT imaging of the chest was performed using the standard protocol during bolus administration of intravenous contrast. Multiplanar CT image reconstructions and MIPs were obtained to evaluate the vascular anatomy. RADIATION DOSE REDUCTION: This exam was performed according to the departmental dose-optimization program which includes automated exposure control, adjustment of the mA and/or kV according to patient size and/or use of iterative reconstruction technique. CONTRAST:  85m OMNIPAQUE IOHEXOL 350 MG/ML SOLN COMPARISON:  PET-CT 09/12/2022. FINDINGS: Cardiovascular: Heart is mildly enlarged. There is no pericardial effusion. Aorta is normal in size. There is adequate opacification of the pulmonary arteries. There is no evidence for pulmonary embolism. The main pulmonary artery is enlarged compatible with pulmonary artery hypertension. Mediastinum/Nodes: The left thyroid is  heterogeneous and enlarged similar to the prior study. There are diffuse nonenlarged mediastinal and hilar lymph nodes present which have increased in size and number when compared to the prior study. Visualized esophagus is within normal limits. Lungs/Pleura: Bilateral multifocal patchy ground-glass and interstitial opacities are seen in the bilateral upper lobes and lower lobes, left greater than right. Opacities are new in the right lung when compared to the prior exam. There is no pleural effusion or pneumothorax. Air-fluid levels are seen within left lower lobe bronchi, new from prior. No pleural effusion or pneumothorax. Upper Abdomen: Right liver mass grossly unchanged. Complex right renal cystic lesion grossly unchanged. Musculoskeletal: No chest wall abnormality. No acute or significant osseous findings. Review of the MIP images confirms the above findings. IMPRESSION: 1. No evidence for pulmonary embolism. 2. Progression of bilateral multifocal patchy ground-glass and interstitial opacities, left greater than right. Findings are concerning for multifocal pneumonia. 3. Air-fluid levels in the left lower lobe bronchi worrisome for aspiration. 4. Mediastinal and hilar lymphadenopathy has increased in size and number when compared to the prior study, likely reactive. 5. Cardiomegaly. 6. Findings compatible with pulmonary artery hypertension. 7. Stable right liver mass and complex right renal cystic lesion. 8. Stable enlargement of the left thyroid. Electronically Signed   By: ARonney AstersM.D.   On: 09/15/2022 20:21   DG Chest 2 View  Result Date: 09/15/2022 CLINICAL DATA:  Recently diagnosed pneumonia with hypoxia EXAM: CHEST - 2 VIEW COMPARISON:  Chest radiograph dated 08/20/2022, NM PET dated 09/12/2022 FINDINGS: Normal lung volumes. Diffuse bilateral airspace opacities. No definite pleural effusion or pneumothorax. Cardiac silhouette is obscured. The visualized skeletal structures are unremarkable.  IMPRESSION: Diffuse bilateral airspace opacities, increased on the right when compared to prior nuclear medicine PET, which may represent multifocal pneumonia or pulmonary edema. Electronically Signed   By: LDarrin NipperM.D.   On: 09/15/2022 16:10   NM PET Image Initial (PI) Skull Base To Thigh  Result Date: 09/13/2022 CLINICAL DATA:  Initial treatment strategy for cholangiocarcinoma. EXAM: NUCLEAR MEDICINE PET SKULL BASE TO THIGH TECHNIQUE: 15.36 mCi F-18 FDG was injected intravenously. Full-ring PET imaging was performed from the skull base to thigh after the radiotracer. CT data was obtained and used for attenuation correction and anatomic localization. Fasting blood glucose: 115 mg/dl COMPARISON:  Foot MRI 08/19/2022 and CT scan 08/18/2022 FINDINGS: Mediastinal blood pool activity: SUV max 1.62 Liver activity: SUV max NA NECK: No hypermetabolic lymph nodes in the neck. Incidental CT findings: Left thyroid goiter but no areas of hypermetabolism. CHEST: No hypermetabolic mediastinal or hilar nodes. No suspicious pulmonary nodules on the CT scan. Incidental CT findings: Significant diffuse airspace process in the left lung progressive since the prior chest CT. Findings could be due to atypical pneumonia, pulmonary  hemorrhage or left-sided aspiration. Mild FDG uptake but below that of background mediastinal activity. No pleural effusions. ABDOMEN/PELVIS: Large right hepatic lobe mass is hypermetabolic with SUV max of 0.08 and consistent with known cholangiocarcinoma. This is largely in segment a but does extend in the segment 7. Scattered periportal and celiac axis lymph nodes not demonstrate any hypermetabolism. The largest node in the gastrohepatic ligament measures 19 mm but SUV max is 1.58. Small scattered retroperitoneal lymph nodes are stable. No hypermetabolism. Incidental CT findings: Stable benign bilateral renal cysts as demonstrated on prior MRI. No imaging evaluation or follow-up is necessary. Stable  aortic and branch vessel calcifications. SKELETON: There are 4 hypermetabolic bone lesions noted. Small lesions associated with the right transverse process of T1 has an SUV max of 6.82. Small lytic lesion associated with the base of the left coracoid process has an SUV max of 4.25. Small lytic lesion associated with the right ischial tuberosity has an SUV max of 4.08. Larger lytic lesion involving the left ischial tuberosity has an SUV max of 3.23. Incidental CT findings: None. IMPRESSION: 1. Large right hepatic lobe mass is hypermetabolic and consistent with known cholangiocarcinoma. 2. Scattered borderline enlarged upper abdominal lymph nodes do not show any significant FDG uptake. 3. 4 hypermetabolic bone lesions consistent with metastatic disease. 4. Progressive diffuse airspace process in the left lung could be due to atypical pneumonia, pulmonary hemorrhage or left-sided aspiration. 5. Aortic atherosclerosis. Aortic Atherosclerosis (ICD10-I70.0). Electronically Signed   By: Marijo Sanes M.D.   On: 09/13/2022 10:27    Lab Results: Personally reviewed CBC    Component Value Date/Time   WBC 4.2 09/16/2022 0633   RBC 4.91 09/16/2022 0633   HGB 15.4 09/16/2022 0633   HCT 47.3 09/16/2022 0633   PLT 171 09/16/2022 0633   MCV 96.3 09/16/2022 0633   MCH 31.4 09/16/2022 0633   MCHC 32.6 09/16/2022 0633   RDW 13.1 09/16/2022 0633   LYMPHSABS 0.5 (L) 09/15/2022 1524   MONOABS 0.6 09/15/2022 1524   EOSABS 0.0 09/15/2022 1524   BASOSABS 0.0 09/15/2022 1524    BMET    Component Value Date/Time   NA 136 09/16/2022 0633   K 3.7 09/16/2022 0633   CL 107 09/16/2022 0633   CO2 23 09/16/2022 0633   GLUCOSE 119 (H) 09/16/2022 0633   BUN 9 09/16/2022 0633   CREATININE 0.71 09/16/2022 0633   CALCIUM 8.2 (L) 09/16/2022 0633   GFRNONAA >60 09/16/2022 0633   GFRAA >60 07/24/2017 0439    BNP    Component Value Date/Time   BNP 29.2 09/15/2022 1645    ProBNP No results found for:  "PROBNP"  Specialty Problems       Pulmonary Problems   Pneumonia   Multifocal pneumonia   Snoring    No Known Allergies  Immunization History  Administered Date(s) Administered   DT (Pediatric) 03/22/2018   Influenza Whole 09/14/2009   Influenza, Quadrivalent, Recombinant, Inj, Pf 08/18/2019   Influenza,inj,Quad PF,6+ Mos 08/28/2016, 07/23/2017, 10/20/2018, 08/08/2022   Influenza,inj,quad, With Preservative 10/29/2017   Influenza-Unspecified 10/12/2021   PFIZER(Purple Top)SARS-COV-2 Vaccination 12/31/2019   PNEUMOCOCCAL CONJUGATE-20 08/08/2022   Pneumococcal Conjugate-13 08/28/2016   Pneumococcal Polysaccharide-23 10/20/2018   Td 12/08/2008, 03/22/2018    Past Medical History:  Diagnosis Date   Antiphospholipid antibody syndrome (HCC)    Arthritis    Diabetes mellitus without complication (HCC)    DVT (deep venous thrombosis) (HCC)    Lt leg   Dyspnea    Elevated lipids  Erythrocytosis    Hyperlipidemia    Hypertension    Lower extremity edema    Multinodular thyroid    Post-thrombotic syndrome     Tobacco History: Social History   Tobacco Use  Smoking Status Former   Packs/day: 0.50   Years: 6.00   Total pack years: 3.00   Types: Cigarettes   Quit date: 1982   Years since quitting: 41.9  Smokeless Tobacco Never  Tobacco Comments   Pt states when he did smoke he smoked at most 1 ppd. ALS 09/26/2022   Counseling given: Not Answered Tobacco comments: Pt states when he did smoke he smoked at most 1 ppd. ALS 09/26/2022   Continue to not smoke  Outpatient Encounter Medications as of 09/26/2022  Medication Sig   amLODipine (NORVASC) 10 MG tablet Take 1 tablet (10 mg total) by mouth daily.   benzonatate (TESSALON) 200 MG capsule Take 1 capsule (200 mg total) by mouth 3 (three) times daily as needed for cough.   dexamethasone (DECADRON) 4 MG tablet Take 2 tablets daily x 3 days starting the day after cisplatin chemotherapy. Take with food.    enoxaparin (LOVENOX) 120 MG/0.8ML injection Inject 0.8 mLs (120 mg total) into the skin every 12 (twelve) hours for 28 days.   glipiZIDE (GLUCOTROL) 10 MG tablet Take 1 tablet (10 mg total) by mouth 2 (two) times daily before a meal. (Patient taking differently: Take 10 mg by mouth daily before breakfast.)   glucose blood test strip 1 each by Other route as needed for other. accu chek aviva plusUse as instructed   levofloxacin (LEVAQUIN) 750 MG tablet Take 1 tablet (750 mg total) by mouth daily.   magnesium oxide (MAG-OX) 400 (240 Mg) MG tablet Take 400 mg by mouth in the morning.   metFORMIN (GLUCOPHAGE) 500 MG tablet TAKE TWO TABLETS BY MOUTH EVERY MORNING WITH BREAKFAST (Patient taking differently: Take 1,000 mg by mouth daily with breakfast.)   MUCINEX D MAX STRENGTH [phone] MG TB12 Take 1,200 mg by mouth 2 (two) times daily as needed (for congestion or to loosen mucous in the chest).   ondansetron (ZOFRAN) 8 MG tablet Take 1 tablet (8 mg total) by mouth every 8 (eight) hours as needed for nausea or vomiting. Start on the third day after cisplatin.   pravastatin (PRAVACHOL) 20 MG tablet Take 1 tablet (20 mg total) by mouth daily.   prochlorperazine (COMPAZINE) 10 MG tablet Take 1 tablet (10 mg total) by mouth every 6 (six) hours as needed (Nausea or vomiting).   tadalafil (CIALIS) 20 MG tablet Take 0.5-1 tablets (10-20 mg total) by mouth every other day as needed for erectile dysfunction.   triamcinolone cream (KENALOG) 0.1 % Apply 1 Application topically 3 (three) times daily. (Patient taking differently: Apply 1 Application topically 3 (three) times daily as needed (for bilateral, below-the-knees skin irritation).)   [DISCONTINUED] albuterol (VENTOLIN HFA) 108 (90 Base) MCG/ACT inhaler Inhale 2 puffs into the lungs every 4 (four) hours as needed. (Patient taking differently: Inhale 2 puffs into the lungs See admin instructions. Inhale 2 puffs into the lungs every three to four hours as needed for  shortness of breath or wheezing)   No facility-administered encounter medications on file as of 09/26/2022.     Review of Systems  Review of Systems  No chest pain exertion.  No orthopnea or PND.  Comprehensive review of systems otherwise negative. Physical Exam  BP 132/64 (BP Location: Left Arm, Patient Position: Sitting, Cuff Size: Normal)  Pulse 90   Ht '6\' 3"'$  (1.905 m)   Wt 281 lb (127.5 kg)   SpO2 92%   BMI 35.12 kg/m   Wt Readings from Last 5 Encounters:  09/26/22 281 lb (127.5 kg)  09/15/22 285 lb 7.9 oz (129.5 kg)  09/15/22 285 lb 7.9 oz (129.5 kg)  08/30/22 285 lb 9.6 oz (129.5 kg)  08/27/22 292 lb 4.8 oz (132.6 kg)    BMI Readings from Last 5 Encounters:  09/26/22 35.12 kg/m  09/15/22 35.68 kg/m  09/15/22 35.68 kg/m  08/30/22 35.70 kg/m  08/27/22 36.53 kg/m     Physical Exam General: Sitting in chair, no acute distress Eyes: EOMI, no icterus Neck: Supple, no JVP Pulmonary: Good air movement, scattered rhonchi, normal work of breathing Cardiovascular: Warm, chronic venous stasis changes in lower extremities Abdomen: Nondistended, bowel sounds present MSK: No synovitis, no effusion Neuro: Normal gait, no weakness Psych: Normal mood, full affect  Assessment & Plan:   Multifocal infiltrates: Present on the left on chest x-ray 07/2021.  Left greater than right particular lower lobes are present in the lingula as well 07/2022.  Worsened in density and worsening right lower lobe on CT images of PET scan 09/12/2022.  Interval worsening in the right lower lobe 09/15/2022.  Diffuse nearly nodular but areas of confluence groundglass opacities.  Given persistence do wonder about a recurrent process such as aspiration.  However, given metastatic cholangiocarcinoma, it is possible this represents lymphangitic spread.  Not necessarily consistent with images in 2022.  Pulmonary edema possible but he improved with antibiotics argues against this.  Denies any weight  gain.  Lastly, infectious pneumonia, organizing pneumonia possible although imaging characteristics not classic for this.  Completed a course of antibiotics 07/2022 with subsequent worsening on imaging.  Completed recent 1 week course of antibiotics.  Will extend for an additional week with levofloxacin 750 mg daily.  Plan to repeat a CT scan 4 weeks after last CT scan.  If infiltrates or not improving, discussed need for pursuing likely surgical lung biopsy.  It is encouraging that with antibiotic courses he does have improvement in symptoms of shortness of breath, hypoxemia, malaise, etc.   Return in about 2 months (around 11/26/2022).   Lanier Clam, MD 09/26/2022   This appointment required 62 minutes of patient care (this includes precharting, chart review, review of results, face-to-face care, etc.).

## 2022-09-26 NOTE — Patient Instructions (Addendum)
Nice to meet you  Take levofloxacin 1 tablet daily for 7 days  I am going to order a CT scan to be done around December 15  Return to clinic in 2 months or sooner as needed

## 2022-09-28 ENCOUNTER — Encounter: Payer: Self-pay | Admitting: Oncology

## 2022-09-28 ENCOUNTER — Inpatient Hospital Stay: Payer: BC Managed Care – PPO | Attending: Oncology

## 2022-09-28 ENCOUNTER — Inpatient Hospital Stay (HOSPITAL_BASED_OUTPATIENT_CLINIC_OR_DEPARTMENT_OTHER): Payer: BC Managed Care – PPO | Admitting: Oncology

## 2022-09-28 VITALS — BP 153/60 | HR 80 | Temp 98.4°F | Resp 18 | Wt 282.0 lb

## 2022-09-28 DIAGNOSIS — Z7189 Other specified counseling: Secondary | ICD-10-CM | POA: Diagnosis not present

## 2022-09-28 DIAGNOSIS — C221 Intrahepatic bile duct carcinoma: Secondary | ICD-10-CM | POA: Insufficient documentation

## 2022-09-28 DIAGNOSIS — D6861 Antiphospholipid syndrome: Secondary | ICD-10-CM | POA: Insufficient documentation

## 2022-09-28 DIAGNOSIS — Z86718 Personal history of other venous thrombosis and embolism: Secondary | ICD-10-CM | POA: Insufficient documentation

## 2022-09-28 DIAGNOSIS — J189 Pneumonia, unspecified organism: Secondary | ICD-10-CM | POA: Insufficient documentation

## 2022-09-28 DIAGNOSIS — E042 Nontoxic multinodular goiter: Secondary | ICD-10-CM | POA: Insufficient documentation

## 2022-09-28 DIAGNOSIS — Z5111 Encounter for antineoplastic chemotherapy: Secondary | ICD-10-CM | POA: Insufficient documentation

## 2022-09-28 DIAGNOSIS — Z5112 Encounter for antineoplastic immunotherapy: Secondary | ICD-10-CM | POA: Diagnosis not present

## 2022-09-28 DIAGNOSIS — Z7901 Long term (current) use of anticoagulants: Secondary | ICD-10-CM | POA: Insufficient documentation

## 2022-09-28 LAB — CBC WITH DIFFERENTIAL/PLATELET
Abs Immature Granulocytes: 0.02 10*3/uL (ref 0.00–0.07)
Basophils Absolute: 0 10*3/uL (ref 0.0–0.1)
Basophils Relative: 1 %
Eosinophils Absolute: 0 10*3/uL (ref 0.0–0.5)
Eosinophils Relative: 1 %
HCT: 52.1 % — ABNORMAL HIGH (ref 39.0–52.0)
Hemoglobin: 17.3 g/dL — ABNORMAL HIGH (ref 13.0–17.0)
Immature Granulocytes: 0 %
Lymphocytes Relative: 7 %
Lymphs Abs: 0.4 10*3/uL — ABNORMAL LOW (ref 0.7–4.0)
MCH: 31.1 pg (ref 26.0–34.0)
MCHC: 33.2 g/dL (ref 30.0–36.0)
MCV: 93.7 fL (ref 80.0–100.0)
Monocytes Absolute: 0.6 10*3/uL (ref 0.1–1.0)
Monocytes Relative: 10 %
Neutro Abs: 4.8 10*3/uL (ref 1.7–7.7)
Neutrophils Relative %: 81 %
Platelets: 223 10*3/uL (ref 150–400)
RBC: 5.56 MIL/uL (ref 4.22–5.81)
RDW: 12.4 % (ref 11.5–15.5)
WBC: 5.9 10*3/uL (ref 4.0–10.5)
nRBC: 0 % (ref 0.0–0.2)

## 2022-09-28 LAB — COMPREHENSIVE METABOLIC PANEL
ALT: 23 U/L (ref 0–44)
AST: 24 U/L (ref 15–41)
Albumin: 3.8 g/dL (ref 3.5–5.0)
Alkaline Phosphatase: 55 U/L (ref 38–126)
Anion gap: 9 (ref 5–15)
BUN: 8 mg/dL (ref 8–23)
CO2: 28 mmol/L (ref 22–32)
Calcium: 8.9 mg/dL (ref 8.9–10.3)
Chloride: 101 mmol/L (ref 98–111)
Creatinine, Ser: 0.75 mg/dL (ref 0.61–1.24)
GFR, Estimated: >60 mL/min (ref >60)
Glucose, Bld: 147 mg/dL — ABNORMAL HIGH (ref 70–99)
Potassium: 4.7 mmol/L (ref 3.5–5.1)
Sodium: 138 mmol/L (ref 135–145)
Total Bilirubin: 0.6 mg/dL (ref 0.3–1.2)
Total Protein: 6.6 g/dL (ref 6.5–8.1)

## 2022-09-28 LAB — MAGNESIUM: Magnesium: 2.2 mg/dL (ref 1.7–2.4)

## 2022-09-28 NOTE — Assessment & Plan Note (Signed)
Imaging findings and pathology results were reviewed with patient.  Clinical diagnosis is cholangiocarcinoma, although atypical with both normal CEA and CA 19.9 Case was presented at tumor board, I have also discussed with pathologist Dr.Rubinas,  IHC pattern is more consistent with adenocarcinoma, unlikely B[place] [due to negative HepPar 1 and arginase], cholangiocarcinoma is favored, although this is a diagnosis of exclusion.  Recommend palliative chemotherapy with gemcitabine, Cisplatin and Durvalumab. The diagnosis and care plan were discussed with patient in detail.  The goal of treatment which is to palliate disease, disease related symptoms, improve quality of life and hopefully prolong life was highlighted in our discussion.   I explained to the patient the rationale and risks of the treatment. He agrees with the plan.  Patient has hearing testing scheduled  NGS showed BAP1 FS mutation. I have asked pathology to add melanoma staining.   Left lung airspace process, follow-up with pulmonology.  Pending further workup.

## 2022-09-28 NOTE — Progress Notes (Unsigned)
Patient here for oncology follow-up appointment,  concerns of dizziness & recent hospitalization

## 2022-09-30 ENCOUNTER — Encounter: Payer: Self-pay | Admitting: Oncology

## 2022-09-30 NOTE — Assessment & Plan Note (Addendum)
Discussed with patient.  Treatment is with palliative intent.

## 2022-09-30 NOTE — Progress Notes (Signed)
Hematology/Oncology Progress note [phone] [phone]      ASSESSMENT & PLAN:   Cancer Staging  Cholangiocarcinoma [facility]) Staging form: Intrahepatic Bile Duct, AJCC 8th Edition - Clinical stage from 08/27/2022: Stage IV (cT2, cN1, cM1) - Signed by Earlie Server, MD on 09/13/2022   Cholangiocarcinoma [facility]) Imaging findings and pathology results were reviewed with patient.  Clinical diagnosis is cholangiocarcinoma, although atypical with both normal CEA and CA 19.9 Case was presented at tumor board, I have also discussed with pathologist Dr.Rubinas,  IHC pattern is more consistent with adenocarcinoma, unlikely [place] [due to negative HepPar 1 and arginase], cholangiocarcinoma is favored, although this is a diagnosis of exclusion.  Recommend palliative chemotherapy with gemcitabine, Cisplatin and Durvalumab. The diagnosis and care plan were discussed with patient in detail.  The goal of treatment which is to palliate disease, disease related symptoms, improve quality of life and hopefully prolong life was highlighted in our discussion.   I explained to the patient the rationale and risks of the treatment. He agrees with the plan.  Patient has hearing testing scheduled  NGS showed BAP1 FS mutation. I have asked pathology to add melanoma staining.   Left lung airspace process, follow-up with pulmonology.  Pending further workup.  Goals of care, counseling/discussion Discussed with patient.  Treatment is with palliative intent.  Antiphospholipid syndrome (HCC) #Recurrent lower extremity DVT, secondary to antiphospholipid syndrome.-.  Persistent lupus anticoagulant positive. Recommend life time anticoagulation Patient was previously on Coumadin now on Lovenox. Recommend continue Lovenox 1 mg/kg twice daily given the upcoming possible bronchoscopy by his pulmonologist, as well as possible cytopenia secondary to chemotherapy.  No orders of the defined types were placed in  this encounter.  Follow up  Patient has a very important trip planned next week and prefer to start his treatment after 10/09/2022.  I will adjust his chemotherapy started until after that date.  All questions were answered. The patient knows to call the clinic with any problems, questions or concerns.  Earlie Server, MD, PhD [facility] Hematology Oncology 09/28/2022   CHIEF COMPLAINTS/REASON FOR VISIT:  Follow up for right lower extremity DVT, antiphospholipid syndrome, metastatic cholangiocarcinoma. HISTORY OF PRESENTING ILLNESS:  Patient reports remote history of left lower extremity DVT in 2002.  He was initiated on Lovenox and bridged to Coumadin.  Patient took warfarin for 2 years before anticoagulation was stopped. 12/03/2021, patient presented to emergency room for evaluation of right lower extremity pain and swelling for about a week.  Started on the inner side of right thigh and migrated to the right calf. + Associated with swelling.  Denies any recent injury, hospitalization, surgery.  He first noticed the symptoms after playing basketball with his grandson.   12/03/2021, right lower extremity ultrasound showed occlusive DVT extending from the mid aspect of the femoral vein through the imaged tibial vein.  Age-indeterminate. Patient was started on Xarelto. He was referred to establish care with vascular surgeon and was seen by [name] on 12/06/2021.  Shared decision was made not to proceed with embolectomy.  Continue anticoagulation. Patient was referred to hematology oncology for further evaluation.  Patient denies any family history of blood clots.  Denies any unintentional weight loss, fever or night sweats. 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  He sometimes stay in his car while waiting for next assessment appointment.  He reports the right lower extremity symptom has improved since the  start of Xarelto.  No active bleeding events.  #12/18/2021 hypercoagulable work-up showed JAK2 V617F mutation negative, with reflex to other mutations CALR, MPL, JAK 2 Ex 12-15 mutations negative, negative anticardiolipin IgG and IgM antibodies, positive lupus anticoagulant, negative factor V Leiden mutation, negative prothrombin gene mutation, normal protein C activity, normal protein S antigen level. Patient was recommended to switch to Coumadin with INR goal of 2-3.  #03/15/2022, repeat lupus anticoagulant is persistently positive- + antiphospholipid syndrome.  Patient is currently on Lovenox 1 mg/kg twice daily.  admitted due to multifocal pneumonia, treated with Influenza panel negative, COVID-negative, HIV negative, Complete course of antibiotics with Zithromax, Vantin   + HCV antibody positive, HCV RNA quantification not detected. HCV RT PCR not detected.   INTERVAL HISTORY SHAFIQ LARCH is a 61 y.o. male who has above history reviewed by me today presents for follow up visit for management of right lower extremity DVT and antiphospholipid syndrome, metastatic cholangiocarcinoma.  Oncology History  Cholangiocarcinoma ([place])  08/18/2022 Imaging   CT chest w contrast showed 1. Multifocal airspace disease in both lungs, left side greater than right. Findings are most compatible with multifocal pneumonia. 2. Multiple new hepatic lesions with enlarging lymph nodes in the upper abdomen and chest. Findings are concerning for metastatic disease. Based on the multifocal pneumonia, hepatic abscesses would also be in the differential diagnosis. Recommend further characterization of the abdomen and pelvis with CT with IV contrast. 3. Enlargement of the main pulmonary artery could be associated with pulmonary hypertension. 4. Coronary artery calcifications. 5. Multinodular goiter. Patient has known thyroid nodules and previous thyroid ultrasound.    08/18/2022 Imaging   CT abdomen pelvis w  contrast  Interval development of 6.5 x 5.2 cm heterogeneously enhancing mass in right hepatic lobe. This is highly concerning for neoplasm or malignancy, and further evaluation with MRI is recommended.   Mildly enlarged periaortic adenopathy is noted, with the largest lymph node measuring 12 mm. Metastatic disease cannot be excluded. Mildly enlarged adenopathy is also noted in the gastrohepatic ligament, but this is unchanged compared to prior exam.   Stable bibasilar lung opacities are noted concerning for inflammation, atelectasis or possibly scarring.   Grossly stable multi-septated cystic lesion is seen involving upper pole of right kidney with peripheral calcifications compared to prior exam of 2019, most likely representing benign etiology.   Aortic Atherosclerosis    08/18/2022 - 08/21/2022 Hospital Admission   Admitted due to multifocal pneumonia, treated with Influenza panel negative, COVID-negative, HIV negative, Complete course of antibiotics with Zithromax, Vantin     08/19/2022 Imaging   MR abdomen w wo contrast  Marked caudate lobe hypertrophy, highly suspicious for hepatic cirrhosis.   Numerous hypervascular masses throughout the right hepatic lobe, highly suspicious for multifocal hepatocellular carcinoma. Recommend correlation with AFP and consider tissue sampling.   Mild abdominal lymphadenopathy, with differential diagnosis including metastatic disease and reactive lymphadenopathy in setting of cirrhosis.   Bilateral lower lobe infiltrates, as better demonstrated on recent CT.    08/19/2022 Tumor Marker   AFP 4.1   08/27/2022 Initial Diagnosis   Cholangiocarcinoma  -08/20/22 Liver mass biopsy showed poorly differentiated carcinoma Immunohistochemical stains show that the poorly differentiated carcinoma is positive for CK7 and MOC-31, suggestive of a poorly differentiated adenocarcinoma.  Immunohistochemical stains for CK20, CDX2, HepPar 1 and arginase are negative.   Immunostain for glypican-3 shows very focal  likely nonspecific labeling.  This immunoprofile is nonspecific but can be compatible with a poorly differentiated primary cholangiocarcinoma in  absence of any other lesions.   NGS showed BAP 1 mutation.   08/27/2022 Cancer Staging   Staging form: Intrahepatic Bile Duct, AJCC 8th Edition - Clinical stage from 08/27/2022: Stage IV (cT2, cN1, cM1) - Signed by Earlie Server, MD on 09/13/2022 Stage prefix: Initial diagnosis   08/27/2022 Tumor Marker   CA 19.9  16 CEA 2.8   09/12/2022 Imaging   PET scan showed 1. Large right hepatic lobe mass is hypermetabolic and consistent with known cholangiocarcinoma. 2. Scattered borderline enlarged upper abdominal lymph nodes do not show any significant FDG uptake. 3. 4 hypermetabolic bone lesions consistent with metastatic disease. 4. Progressive diffuse airspace process in the left lung could be due to atypical pneumonia, pulmonary hemorrhage or left-sided aspiration. 5. Aortic atherosclerosis.  09/12/2022 I had a phone discussion with patient after his PET scan resulted.  Recommend systemic chemotherapy.  Patient would like to defer until his second opinion visit at [facility]   09/15/2022 Imaging   CT angio chest pulmonary embolism protocol showed 1. No evidence for pulmonary embolism. 2. Progression of bilateral multifocal patchy ground-glass and interstitial opacities, left greater than right. Findings are concerning for multifocal pneumonia. 3. Air-fluid levels in the left lower lobe bronchi worrisome for aspiration. 4. Mediastinal and hilar lymphadenopathy has increased in size and number when compared to the prior study, likely reactive.5. Cardiomegaly. 6. Findings compatible with pulmonary artery hypertension.7. Stable right liver mass and complex right renal cystic lesion. 8. Stable enlargement of the left thyroid.   09/15/2022 - 09/17/2022 Hospital Admission   Patient was admitted due to shortness of  breath, CT showed progression of bilateral multifocal patchy groundglass and interstitial opacities.  Left greater than right.  Concerning for multifocal pneumonia.  Patient was started on broad-spectrum antibiotics with significant improvement.  He was discharged on oral antibiotics.   09/19/2022 Miscellaneous   Patient went to [facility] for second opinion.  He was seen by Stanton Kidney, who felt that the presentation is consistent with metastatic cholangiocarcinoma, and agrees with the plan of cisplatin/gemcitabine/durvalumab..    10/11/2022 -  Chemotherapy   Patient is on Treatment Plan : Cisplatin D1,8 + Gemcitabine + Durvalumab  D1,8 q21d x        Review of Systems  Constitutional:  Positive for fatigue. Negative for appetite change, chills and fever.  HENT:   Negative for hearing loss and voice change.   Eyes:  Negative for eye problems.  Respiratory:  Negative for chest tightness, cough and shortness of breath.   Cardiovascular:  Negative for chest pain.  Gastrointestinal:  Negative for abdominal distention, abdominal pain and blood in stool.  Endocrine: Negative for hot flashes.  Genitourinary:  Negative for difficulty urinating and frequency.   Musculoskeletal:  Negative for arthralgias.  Skin:  Negative for itching and rash.  Neurological:  Negative for extremity weakness.  Hematological:  Negative for adenopathy. Bruises/bleeds easily.  Psychiatric/Behavioral:  Negative for confusion.     MEDICAL HISTORY:  Past Medical History:  Diagnosis Date   Antiphospholipid antibody syndrome (HCC)    Arthritis    Diabetes mellitus without complication (HCC)    DVT (deep venous thrombosis) (HCC)    Lt leg   Dyspnea    Elevated lipids    Erythrocytosis    Hyperlipidemia    Hypertension    Lower extremity edema    Multinodular thyroid    Post-thrombotic syndrome     SURGICAL HISTORY: Past Surgical History:  Procedure Laterality Date   COLONOSCOPY WITH PROPOFOL N/A  09/23/2018    Per Dr. Alice Reichert, polyp, repeat in 3 yrs    EYE MUSCLE SURGERY     JOINT REPLACEMENT Left 07/22/2017   TOTAL HIP ARTHROPLASTY Left 07/22/2017   Procedure: TOTAL HIP ARTHROPLASTY;  Surgeon: Dereck Leep, MD;  Location: [facility] ORS;  Service: Orthopedics;  Laterality: Left;    SOCIAL HISTORY: Social History   Socioeconomic History   Marital status: Married    Spouse name: Not on file   Number of children: Not on file   Years of education: Not on file   Highest education level: Not on file  Occupational History   Not on file  Tobacco Use   Smoking status: Former    Packs/day: 0.50    Years: 6.00    Total pack years: 3.00    Types: Cigarettes    Quit date: 54    Years since quitting: 41.9   Smokeless tobacco: Never   Tobacco comments:    Pt states when he did smoke he smoked at most 1 ppd. ALS 09/26/2022  Vaping Use   Vaping Use: Never used  Substance and Sexual Activity   Alcohol use: Yes    Comment: 2-3 nights a week   Drug use: No   Sexual activity: Not on file  Other Topics Concern   Not on file  Social History Narrative   Not on file   Social Determinants of Health   Financial Resource Strain: Low Risk  (08/27/2022)   Overall Financial Resource Strain (CARDIA)    Difficulty of Paying Living Expenses: Not very hard  Food Insecurity: No Food Insecurity (09/15/2022)   Hunger Vital Sign    Worried About Running Out of Food in the Last Year: Never true    Ran Out of Food in the Last Year: Never true  Transportation Needs: No Transportation Needs (09/15/2022)   PRAPARE - Hydrologist (Medical): No    Lack of Transportation (Non-Medical): No  Physical Activity: Insufficiently Active (08/27/2022)   Exercise Vital Sign    Days of Exercise per Week: 3 days    Minutes of Exercise per Session: 30 min  Stress: Stress Concern Present (08/27/2022)   [place]    Feeling of  Stress : Rather much  Social Connections: Socially Integrated (08/27/2022)   Social Connection and Isolation Panel [NHANES]    Frequency of Communication with Friends and Family: More than three times a week    Frequency of Social Gatherings with Friends and Family: More than three times a week    Attends Religious Services: 1 to 4 times per year    Active Member of [employer] or Organizations: Yes    Attends Archivist Meetings: Never    Marital Status: Married  Human resources officer Violence: Not At Risk (09/15/2022)   Humiliation, Afraid, Rape, and Kick questionnaire    Fear of Current or Ex-Partner: No    Emotionally Abused: No    Physically Abused: No    Sexually Abused: No    FAMILY HISTORY: Family History  Problem Relation Age of Onset   Breast cancer Mother    Emphysema Father    Colon cancer Maternal Grandmother     ALLERGIES:  has No Known Allergies.  MEDICATIONS:  Current Outpatient Medications  Medication Sig Dispense Refill   albuterol (VENTOLIN HFA) 108 (90 Base) MCG/ACT inhaler Inhale 2 puffs into the lungs every 4 (four) hours as needed. 18 g 1  amLODipine (NORVASC) 10 MG tablet Take 1 tablet (10 mg total) by mouth daily. 30 tablet 0   benzonatate (TESSALON) 200 MG capsule Take 1 capsule (200 mg total) by mouth 3 (three) times daily as needed for cough. 30 capsule 3   dexamethasone (DECADRON) 4 MG tablet Take 2 tablets daily x 3 days starting the day after cisplatin chemotherapy. Take with food. 30 tablet 1   enoxaparin (LOVENOX) 120 MG/0.8ML injection Inject 0.8 mLs (120 mg total) into the skin every 12 (twelve) hours for 28 days. 44.8 mL 0   glipiZIDE (GLUCOTROL) 10 MG tablet Take 1 tablet (10 mg total) by mouth 2 (two) times daily before a meal. (Patient taking differently: Take 10 mg by mouth daily before breakfast.) 180 tablet 3   glucose blood test strip 1 each by Other route as needed for other. accu chek aviva plusUse as instructed     levofloxacin  (LEVAQUIN) 750 MG tablet Take 1 tablet (750 mg total) by mouth daily. 7 tablet 0   magnesium oxide (MAG-OX) 400 (240 Mg) MG tablet Take 400 mg by mouth in the morning.     metFORMIN (GLUCOPHAGE) 500 MG tablet TAKE TWO TABLETS BY MOUTH EVERY MORNING WITH BREAKFAST (Patient taking differently: Take 1,000 mg by mouth daily with breakfast.) 180 tablet 0   MUCINEX D MAX STRENGTH [phone] MG TB12 Take 1,200 mg by mouth 2 (two) times daily as needed (for congestion or to loosen mucous in the chest).     ondansetron (ZOFRAN) 8 MG tablet Take 1 tablet (8 mg total) by mouth every 8 (eight) hours as needed for nausea or vomiting. Start on the third day after cisplatin. 30 tablet 1   pravastatin (PRAVACHOL) 20 MG tablet Take 1 tablet (20 mg total) by mouth daily. 90 tablet 3   prochlorperazine (COMPAZINE) 10 MG tablet Take 1 tablet (10 mg total) by mouth every 6 (six) hours as needed (Nausea or vomiting). 30 tablet 1   tadalafil (CIALIS) 20 MG tablet Take 0.5-1 tablets (10-20 mg total) by mouth every other day as needed for erectile dysfunction. 20 tablet 11   triamcinolone cream (KENALOG) 0.1 % Apply 1 Application topically 3 (three) times daily. (Patient taking differently: Apply 1 Application topically 3 (three) times daily as needed (for bilateral, below-the-knees skin irritation).) 453.6 g 1   No current facility-administered medications for this visit.     PHYSICAL EXAMINATION: ECOG PERFORMANCE STATUS: 1 - Symptomatic but completely ambulatory Vitals:   09/28/22 1025  BP: (!) 153/60  Pulse: 80  Resp: 18  Temp: 98.4 F (36.9 C)  SpO2: 95%   Filed Weights   09/28/22 1025  Weight: 127.9 kg    Physical Exam Constitutional:      General: He is not in acute distress. HENT:     Head: Normocephalic and atraumatic.  Eyes:     General: No scleral icterus. Cardiovascular:     Rate and Rhythm: Normal rate.  Pulmonary:     Effort: Pulmonary effort is normal. No respiratory distress.     Breath  sounds: No wheezing.  Abdominal:     General: Bowel sounds are normal. There is no distension.  Musculoskeletal:        General: Normal range of motion.     Cervical back: Normal range of motion and neck supple.     Right lower leg: No edema.     Comments: Bilateral chronic lower extremity swelling, varicose veins  Skin:    General: Skin is warm  and dry.     Findings: No erythema or rash.  Neurological:     Mental Status: He is alert and oriented to person, place, and time. Mental status is at baseline.     Cranial Nerves: No cranial nerve deficit.  Psychiatric:        Mood and Affect: Mood normal.     LABORATORY DATA:  I have reviewed the data as listed    Latest Ref Rng & Units 09/28/2022    9:46 AM 09/16/2022    6:33 AM 09/15/2022    3:24 PM  CBC  WBC 4.0 - 10.5 K/uL 5.9  4.2  7.2   Hemoglobin 13.0 - 17.0 g/dL 17.3  15.4  16.5   Hematocrit 39.0 - 52.0 % 52.1  47.3  49.8   Platelets 150 - 400 K/uL 223  171  204       Latest Ref Rng & Units 09/28/2022    9:46 AM 09/16/2022    6:33 AM 09/15/2022    3:24 PM  CMP  Glucose 70 - 99 mg/dL 147  119  131   BUN 8 - 23 mg/dL '8  9  10   '$ Creatinine 0.61 - 1.24 mg/dL 0.75  0.71  0.71   Sodium 135 - 145 mmol/L 138  136  137   Potassium 3.5 - 5.1 mmol/L 4.7  3.7  3.5   Chloride 98 - 111 mmol/L 101  107  106   CO2 22 - 32 mmol/L '28  23  24   '$ Calcium 8.9 - 10.3 mg/dL 8.9  8.2  8.7   Total Protein 6.5 - 8.1 g/dL 6.6  5.6  6.5   Total Bilirubin 0.3 - 1.2 mg/dL 0.6  1.1  0.7   Alkaline Phos 38 - 126 U/L 55  45  51   AST 15 - 41 U/L '24  20  31   '$ ALT 0 - 44 U/L '23  22  27     '$ RADIOGRAPHIC STUDIES: I have personally reviewed the radiological images as listed and agreed with the findings in the report. DG Swallowing Func-Speech Pathology  Result Date: 09/17/2022 Table formatting from the original result was not included. Objective Swallowing Evaluation: Type of Study: Bedside Swallow Evaluation  Patient Details Name: Christopher Marsh  MRN: [id number] Date of Birth: 29-Jan-1961 Today's Date: 09/17/2022 Time: SLP Start Time (ACUTE ONLY): 1155 -SLP Stop Time (ACUTE ONLY): 1210 SLP Time Calculation (min) (ACUTE ONLY): 15 min Past Medical History: Past Medical History: Diagnosis Date  Antiphospholipid antibody syndrome ([place])   Arthritis   Diabetes mellitus without complication ([place])   DVT (deep venous thrombosis) ([place])   Lt leg  Dyspnea   Elevated lipids   Erythrocytosis   Hyperlipidemia   Hypertension   Lower extremity edema   Multinodular thyroid   Post-thrombotic syndrome  Past Surgical History: Past Surgical History: Procedure Laterality Date  COLONOSCOPY WITH PROPOFOL N/A 09/23/2018  Per Dr. Alice Reichert, polyp, repeat in 3 yrs   EYE MUSCLE SURGERY    JOINT REPLACEMENT Left 07/22/2017  TOTAL HIP ARTHROPLASTY Left 07/22/2017  Procedure: TOTAL HIP ARTHROPLASTY;  Surgeon: Dereck Leep, MD;  Location: [facility] ORS;  Service: Orthopedics;  Laterality: Left; HPI: Patient is a 61 y.o. male with PMH: cholangiocarcinoma (not on chemotherapy), antiphospholipid syndrome on Lovenox, diabetes, morbid obesity, history of DVT, hyperlipidemia, essential hypertension, chronic lower extremity stasis edema, osteoarthritis who presented to the ER with complaint of shortness of breath with exertion. Patient was worried he had a  PNA and he had a similar episode about a month ago and at that time he did have a PNA. He had a PET scan three days prior to current admission and was told he had a PNA. In ED, CXR showed multifocal PNA. SLP swallow evaluation ordered by MD to r/o aspiration PNA.  Subjective: pleasant  Recommendations for follow up therapy are one component of a multi-disciplinary discharge planning process, led by the attending physician.  Recommendations may be updated based on patient status, additional functional criteria and insurance authorization. Assessment / Plan / Recommendation   09/17/2022  12:20 PM Clinical Impressions Clinical Impression Patient presents  with a normal oral, pharyngeal and esophageal phase of swallow as per this MBS. No penetration or aspiration of any barium consistency during any of the phases of swallow was observed. Barium consistencies tested were: thin, 65m tablet and regular solid with pudding barium. Swallow initiation was timely, no pharyngeal residuals post swallows, UES opening normal and cervical esophageal phase swallow normal. Esophageal sweep did not reveal any barium stasis, retrograde movement of barium and 143mbarium tablet transited fully. SLP Visit Diagnosis Dysphagia, unspecified (R13.10) Impact on safety and function No limitations     09/17/2022  12:20 PM Treatment Recommendations Treatment Recommendations No treatment recommended at this time     09/17/2022  12:23 PM Prognosis Prognosis for Safe Diet Advancement Good   09/17/2022  12:20 PM Diet Recommendations SLP Diet Recommendations Regular solids;Thin liquid Liquid Administration via Cup;Straw Medication Administration Whole meds with liquid Postural Changes Seated upright at 90 degrees     09/17/2022  12:20 PM Other Recommendations Oral Care Recommendations Patient independent with oral care;Oral care BID Follow Up Recommendations No SLP follow up Functional Status Assessment Patient has not had a recent decline in their functional status   09/16/2022   5:01 PM Frequency and Duration  Speech Therapy Frequency (ACUTE ONLY) min 1 x/week Treatment Duration 1 week     09/17/2022  12:20 PM [facility]  09/17/2022  12:20 PM Pharyngeal Phase [facility]  09/17/2022  12:20 PM Cervical Esophageal Phase  Cervical Esophageal Phase WFL JoSonia BallerMA, CCC-SLP Speech Therapy                     CT Angio Chest PE W and/or Wo Contrast  Result Date: 09/15/2022 CLINICAL DATA:  Pneumonia and cough. High probability for PE. Cholangiocarcinoma. EXAM: CT ANGIOGRAPHY CHEST WITH CONTRAST TECHNIQUE: Multidetector CT imaging of the chest was performed using  the standard protocol during bolus administration of intravenous contrast. Multiplanar CT image reconstructions and MIPs were obtained to evaluate the vascular anatomy. RADIATION DOSE REDUCTION: This exam was performed according to the departmental dose-optimization program which includes automated exposure control, adjustment of the mA and/or kV according to patient size and/or use of iterative reconstruction technique. CONTRAST:  7530mMNIPAQUE IOHEXOL 350 MG/ML SOLN COMPARISON:  PET-CT 09/12/2022. FINDINGS: Cardiovascular: Heart is mildly enlarged. There is no pericardial effusion. Aorta is normal in size. There is adequate opacification of the pulmonary arteries. There is no evidence for pulmonary embolism. The main pulmonary artery is enlarged compatible with pulmonary artery hypertension. Mediastinum/Nodes: The left thyroid is heterogeneous and enlarged similar to the prior study. There are diffuse nonenlarged mediastinal and hilar lymph nodes present which have increased in size and number when compared to the prior study. Visualized esophagus is within normal limits. Lungs/Pleura: Bilateral multifocal patchy ground-glass and interstitial opacities are seen  in the bilateral upper lobes and lower lobes, left greater than right. Opacities are new in the right lung when compared to the prior exam. There is no pleural effusion or pneumothorax. Air-fluid levels are seen within left lower lobe bronchi, new from prior. No pleural effusion or pneumothorax. Upper Abdomen: Right liver mass grossly unchanged. Complex right renal cystic lesion grossly unchanged. Musculoskeletal: No chest wall abnormality. No acute or significant osseous findings. Review of the MIP images confirms the above findings. IMPRESSION: 1. No evidence for pulmonary embolism. 2. Progression of bilateral multifocal patchy ground-glass and interstitial opacities, left greater than right. Findings are concerning for multifocal pneumonia. 3. Air-fluid  levels in the left lower lobe bronchi worrisome for aspiration. 4. Mediastinal and hilar lymphadenopathy has increased in size and number when compared to the prior study, likely reactive. 5. Cardiomegaly. 6. Findings compatible with pulmonary artery hypertension. 7. Stable right liver mass and complex right renal cystic lesion. 8. Stable enlargement of the left thyroid. Electronically Signed   By: Ronney Asters M.D.   On: 09/15/2022 20:21   DG Chest 2 View  Result Date: 09/15/2022 CLINICAL DATA:  Recently diagnosed pneumonia with hypoxia EXAM: CHEST - 2 VIEW COMPARISON:  Chest radiograph dated 08/20/2022, NM PET dated 09/12/2022 FINDINGS: Normal lung volumes. Diffuse bilateral airspace opacities. No definite pleural effusion or pneumothorax. Cardiac silhouette is obscured. The visualized skeletal structures are unremarkable. IMPRESSION: Diffuse bilateral airspace opacities, increased on the right when compared to prior nuclear medicine PET, which may represent multifocal pneumonia or pulmonary edema. Electronically Signed   By: Darrin Nipper M.D.   On: 09/15/2022 16:10   NM PET Image Initial (PI) Skull Base To Thigh  Result Date: 09/13/2022 CLINICAL DATA:  Initial treatment strategy for cholangiocarcinoma. EXAM: NUCLEAR MEDICINE PET SKULL BASE TO THIGH TECHNIQUE: 15.36 mCi F-18 FDG was injected intravenously. Full-ring PET imaging was performed from the skull base to thigh after the radiotracer. CT data was obtained and used for attenuation correction and anatomic localization. Fasting blood glucose: 115 mg/dl COMPARISON:  Foot MRI 08/19/2022 and CT scan 08/18/2022 FINDINGS: Mediastinal blood pool activity: SUV max 1.62 Liver activity: SUV max NA NECK: No hypermetabolic lymph nodes in the neck. Incidental CT findings: Left thyroid goiter but no areas of hypermetabolism. CHEST: No hypermetabolic mediastinal or hilar nodes. No suspicious pulmonary nodules on the CT scan. Incidental CT findings: Significant  diffuse airspace process in the left lung progressive since the prior chest CT. Findings could be due to atypical pneumonia, pulmonary hemorrhage or left-sided aspiration. Mild FDG uptake but below that of background mediastinal activity. No pleural effusions. ABDOMEN/PELVIS: Large right hepatic lobe mass is hypermetabolic with SUV max of 0.26 and consistent with known cholangiocarcinoma. This is largely in segment a but does extend in the segment 7. Scattered periportal and celiac axis lymph nodes not demonstrate any hypermetabolism. The largest node in the gastrohepatic ligament measures 19 mm but SUV max is 1.58. Small scattered retroperitoneal lymph nodes are stable. No hypermetabolism. Incidental CT findings: Stable benign bilateral renal cysts as demonstrated on prior MRI. No imaging evaluation or follow-up is necessary. Stable aortic and branch vessel calcifications. SKELETON: There are 4 hypermetabolic bone lesions noted. Small lesions associated with the right transverse process of T1 has an SUV max of 6.82. Small lytic lesion associated with the base of the left coracoid process has an SUV max of 4.25. Small lytic lesion associated with the right ischial tuberosity has an SUV max of 4.08. Larger lytic lesion involving  the left ischial tuberosity has an SUV max of 3.23. Incidental CT findings: None. IMPRESSION: 1. Large right hepatic lobe mass is hypermetabolic and consistent with known cholangiocarcinoma. 2. Scattered borderline enlarged upper abdominal lymph nodes do not show any significant FDG uptake. 3. 4 hypermetabolic bone lesions consistent with metastatic disease. 4. Progressive diffuse airspace process in the left lung could be due to atypical pneumonia, pulmonary hemorrhage or left-sided aspiration. 5. Aortic atherosclerosis. Aortic Atherosclerosis (ICD10-I70.0). Electronically Signed   By: Marijo Sanes M.D.   On: 09/13/2022 10:27

## 2022-09-30 NOTE — Assessment & Plan Note (Signed)
#  Recurrent lower extremity DVT, secondary to antiphospholipid syndrome.-.  Persistent lupus anticoagulant positive. Recommend life time anticoagulation Patient was previously on Coumadin now on Lovenox. Recommend continue Lovenox 1 mg/kg twice daily given the upcoming possible bronchoscopy by his pulmonologist, as well as possible cytopenia secondary to chemotherapy.

## 2022-10-01 ENCOUNTER — Ambulatory Visit: Payer: BC Managed Care – PPO | Admitting: Oncology

## 2022-10-01 ENCOUNTER — Telehealth: Payer: Self-pay

## 2022-10-01 ENCOUNTER — Other Ambulatory Visit: Payer: BC Managed Care – PPO

## 2022-10-01 NOTE — Telephone Encounter (Signed)
Pt has been scheduled for Hearing test on 12/12 @ 8:30a. Pt aware of appt.

## 2022-10-01 NOTE — Telephone Encounter (Signed)
Pt will be staring treatment next week and will need hearing test prior to starting. He will be out of town from 12/7-12/11.   [facility] ENT audiology dept and LVM asking to see if pt can be set up for an appt early this week.

## 2022-10-02 ENCOUNTER — Encounter: Payer: Self-pay | Admitting: Oncology

## 2022-10-02 ENCOUNTER — Inpatient Hospital Stay: Payer: BC Managed Care – PPO

## 2022-10-03 NOTE — Addendum Note (Signed)
Addended by: Earlie Server on: 10/03/2022 08:28 AM   Modules accepted: Orders

## 2022-10-05 ENCOUNTER — Ambulatory Visit: Payer: BC Managed Care – PPO | Admitting: Oncology

## 2022-10-05 ENCOUNTER — Other Ambulatory Visit: Payer: BC Managed Care – PPO

## 2022-10-08 ENCOUNTER — Ambulatory Visit: Payer: BC Managed Care – PPO

## 2022-10-09 DIAGNOSIS — H903 Sensorineural hearing loss, bilateral: Secondary | ICD-10-CM | POA: Diagnosis not present

## 2022-10-10 ENCOUNTER — Inpatient Hospital Stay: Payer: BC Managed Care – PPO

## 2022-10-10 ENCOUNTER — Other Ambulatory Visit: Payer: Self-pay

## 2022-10-10 ENCOUNTER — Inpatient Hospital Stay (HOSPITAL_BASED_OUTPATIENT_CLINIC_OR_DEPARTMENT_OTHER): Payer: BC Managed Care – PPO | Admitting: Oncology

## 2022-10-10 ENCOUNTER — Encounter: Payer: Self-pay | Admitting: Oncology

## 2022-10-10 VITALS — BP 160/66 | HR 87 | Temp 97.3°F | Wt 284.5 lb

## 2022-10-10 DIAGNOSIS — Z7189 Other specified counseling: Secondary | ICD-10-CM | POA: Diagnosis not present

## 2022-10-10 DIAGNOSIS — Z5111 Encounter for antineoplastic chemotherapy: Secondary | ICD-10-CM

## 2022-10-10 DIAGNOSIS — D6861 Antiphospholipid syndrome: Secondary | ICD-10-CM | POA: Diagnosis not present

## 2022-10-10 DIAGNOSIS — J189 Pneumonia, unspecified organism: Secondary | ICD-10-CM | POA: Diagnosis not present

## 2022-10-10 DIAGNOSIS — E042 Nontoxic multinodular goiter: Secondary | ICD-10-CM | POA: Diagnosis not present

## 2022-10-10 DIAGNOSIS — C221 Intrahepatic bile duct carcinoma: Secondary | ICD-10-CM

## 2022-10-10 DIAGNOSIS — Z5112 Encounter for antineoplastic immunotherapy: Secondary | ICD-10-CM | POA: Diagnosis not present

## 2022-10-10 DIAGNOSIS — Z86718 Personal history of other venous thrombosis and embolism: Secondary | ICD-10-CM | POA: Diagnosis not present

## 2022-10-10 DIAGNOSIS — R918 Other nonspecific abnormal finding of lung field: Secondary | ICD-10-CM

## 2022-10-10 DIAGNOSIS — Z7901 Long term (current) use of anticoagulants: Secondary | ICD-10-CM | POA: Diagnosis not present

## 2022-10-10 LAB — CBC WITH DIFFERENTIAL/PLATELET
Abs Immature Granulocytes: 0.01 10*3/uL (ref 0.00–0.07)
Basophils Absolute: 0 10*3/uL (ref 0.0–0.1)
Basophils Relative: 1 %
Eosinophils Absolute: 0.1 10*3/uL (ref 0.0–0.5)
Eosinophils Relative: 1 %
HCT: 48 % (ref 39.0–52.0)
Hemoglobin: 16.3 g/dL (ref 13.0–17.0)
Immature Granulocytes: 0 %
Lymphocytes Relative: 11 %
Lymphs Abs: 0.6 10*3/uL — ABNORMAL LOW (ref 0.7–4.0)
MCH: 31 pg (ref 26.0–34.0)
MCHC: 34 g/dL (ref 30.0–36.0)
MCV: 91.3 fL (ref 80.0–100.0)
Monocytes Absolute: 0.6 10*3/uL (ref 0.1–1.0)
Monocytes Relative: 12 %
Neutro Abs: 3.8 10*3/uL (ref 1.7–7.7)
Neutrophils Relative %: 75 %
Platelets: 188 10*3/uL (ref 150–400)
RBC: 5.26 MIL/uL (ref 4.22–5.81)
RDW: 12.3 % (ref 11.5–15.5)
WBC: 5.1 10*3/uL (ref 4.0–10.5)
nRBC: 0 % (ref 0.0–0.2)

## 2022-10-10 LAB — COMPREHENSIVE METABOLIC PANEL
ALT: 21 U/L (ref 0–44)
AST: 26 U/L (ref 15–41)
Albumin: 3.5 g/dL (ref 3.5–5.0)
Alkaline Phosphatase: 51 U/L (ref 38–126)
Anion gap: 9 (ref 5–15)
BUN: 6 mg/dL — ABNORMAL LOW (ref 8–23)
CO2: 26 mmol/L (ref 22–32)
Calcium: 8.2 mg/dL — ABNORMAL LOW (ref 8.9–10.3)
Chloride: 103 mmol/L (ref 98–111)
Creatinine, Ser: 0.68 mg/dL (ref 0.61–1.24)
GFR, Estimated: >60 mL/min (ref >60)
Glucose, Bld: 133 mg/dL — ABNORMAL HIGH (ref 70–99)
Potassium: 3.8 mmol/L (ref 3.5–5.1)
Sodium: 138 mmol/L (ref 135–145)
Total Bilirubin: 0.5 mg/dL (ref 0.3–1.2)
Total Protein: 6.2 g/dL — ABNORMAL LOW (ref 6.5–8.1)

## 2022-10-10 MED ORDER — LORAZEPAM 0.5 MG PO TABS: 0.5000 mg | ORAL_TABLET | Freq: Three times a day (TID) | ORAL | 0 refills | Status: DC | PRN

## 2022-10-10 MED FILL — Fosaprepitant Dimeglumine For IV Infusion 150 MG (Base Eq): INTRAVENOUS | Qty: 5 | Status: AC

## 2022-10-10 MED FILL — Dexamethasone Sodium Phosphate Inj 100 MG/10ML: INTRAMUSCULAR | Qty: 1 | Status: AC

## 2022-10-10 NOTE — Assessment & Plan Note (Addendum)
Imaging findings and pathology results were reviewed with patient.  Clinical diagnosis is cholangiocarcinoma, although atypical with both normal CEA and CA 19.9 Labs are reviewed and discussed with patient. Proceed with cycle 1 Gemcitabine, Cisplatin and Durvalumab.  Discussed with patient about instructions of antiemetics.

## 2022-10-10 NOTE — Addendum Note (Signed)
Addended by: Earlie Server on: 10/10/2022 09:03 PM   Modules accepted: Orders

## 2022-10-10 NOTE — Assessment & Plan Note (Signed)
Recommend patient to start calcium supplementation.

## 2022-10-10 NOTE — Assessment & Plan Note (Signed)
He follows up with pulmonology. Levaquin was extended and patient finishes.  He has a repeat CT scheduled to assess treatment response.

## 2022-10-10 NOTE — Progress Notes (Signed)
Hematology/Oncology Progress note [phone] [phone]      ASSESSMENT & PLAN:   Cancer Staging  Cholangiocarcinoma [facility]) Staging form: Intrahepatic Bile Duct, AJCC 8th Edition - Clinical stage from 08/27/2022: Stage IV (cT2, cN1, cM1) - Signed by Earlie Server, MD on 09/13/2022   Cholangiocarcinoma [facility]) Imaging findings and pathology results were reviewed with patient.  Clinical diagnosis is cholangiocarcinoma, although atypical with both normal CEA and CA 19.9 Labs are reviewed and discussed with patient. Proceed with cycle 1 Gemcitabine, Cisplatin and Durvalumab.  Discussed with patient about instructions of antiemetics.   Antiphospholipid syndrome (HCC) #Recurrent lower extremity DVT, secondary to antiphospholipid syndrome.-.  Persistent lupus anticoagulant positive. Recommend life time anticoagulation Patient was previously on Coumadin now on Lovenox. Recommend continue Lovenox 1 mg/kg twice daily given the upcoming possible bronchoscopy by his pulmonologist, as well as possible cytopenia secondary to chemotherapy.  Goals of care, counseling/discussion Chemotherapy plan as listed above  Lung infiltrate on CT He follows up with pulmonology. Levaquin was extended and patient finishes.  He has a repeat CT scheduled to assess treatment response.   Hypocalcemia Recommend patient to start calcium supplementation.   No orders of the defined types were placed in this encounter.  Follow up  Per LOS  All questions were answered. The patient knows to call the clinic with any problems, questions or concerns.  Earlie Server, MD, PhD [facility] Hematology Oncology 10/10/2022   CHIEF COMPLAINTS/REASON FOR VISIT:  Follow up for right lower extremity DVT, antiphospholipid syndrome, metastatic cholangiocarcinoma. HISTORY OF PRESENTING ILLNESS:  Patient reports remote history of left lower extremity DVT in 2002.  He was initiated on Lovenox and bridged to Coumadin.   Patient took warfarin for 2 years before anticoagulation was stopped. 12/03/2021, patient presented to emergency room for evaluation of right lower extremity pain and swelling for about a week.  Started on the inner side of right thigh and migrated to the right calf. + Associated with swelling.  Denies any recent injury, hospitalization, surgery.  He first noticed the symptoms after playing basketball with his grandson.   12/03/2021, right lower extremity ultrasound showed occlusive DVT extending from the mid aspect of the femoral vein through the imaged tibial vein.  Age-indeterminate. Patient was started on Xarelto. He was referred to establish care with vascular surgeon and was seen by [name] on 12/06/2021.  Shared decision was made not to proceed with embolectomy.  Continue anticoagulation. Patient was referred to hematology oncology for further evaluation.  Patient denies any family history of blood clots.  Denies any unintentional weight loss, fever or night sweats. 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  He sometimes stay in his car while waiting for next assessment appointment.  He reports the right lower extremity symptom has improved since the start of Xarelto.  No active bleeding events.  #12/18/2021 hypercoagulable work-up showed JAK2 V617F mutation negative, with reflex to other mutations CALR, MPL, JAK 2 Ex 12-15 mutations negative, negative anticardiolipin IgG and IgM antibodies, positive lupus anticoagulant, negative factor V Leiden mutation, negative prothrombin gene mutation, normal protein C activity, normal protein S antigen level. Patient was recommended to switch to Coumadin with INR goal of 2-3.  #03/15/2022, repeat lupus anticoagulant is persistently positive- + antiphospholipid syndrome.  Patient is currently on Lovenox 1 mg/kg twice daily.  admitted due to multifocal pneumonia,  treated with Influenza panel negative, COVID-negative, HIV negative, Complete course of  antibiotics with Zithromax, Vantin   + HCV antibody positive, HCV RNA quantification not detected. HCV RT PCR not detected.   INTERVAL HISTORY LUVERNE ZERKLE is a 61 y.o. male who has above history reviewed by me today presents for follow up visit for management of right lower extremity DVT and antiphospholipid syndrome, metastatic cholangiocarcinoma.  Oncology History  Cholangiocarcinoma ([place])  08/18/2022 Imaging   CT chest w contrast showed 1. Multifocal airspace disease in both lungs, left side greater than right. Findings are most compatible with multifocal pneumonia. 2. Multiple new hepatic lesions with enlarging lymph nodes in the upper abdomen and chest. Findings are concerning for metastatic disease. Based on the multifocal pneumonia, hepatic abscesses would also be in the differential diagnosis. Recommend further characterization of the abdomen and pelvis with CT with IV contrast. 3. Enlargement of the main pulmonary artery could be associated with pulmonary hypertension. 4. Coronary artery calcifications. 5. Multinodular goiter. Patient has known thyroid nodules and previous thyroid ultrasound.    08/18/2022 Imaging   CT abdomen pelvis w contrast  Interval development of 6.5 x 5.2 cm heterogeneously enhancing mass in right hepatic lobe. This is highly concerning for neoplasm or malignancy, and further evaluation with MRI is recommended.   Mildly enlarged periaortic adenopathy is noted, with the largest lymph node measuring 12 mm. Metastatic disease cannot be excluded. Mildly enlarged adenopathy is also noted in the gastrohepatic ligament, but this is unchanged compared to prior exam.   Stable bibasilar lung opacities are noted concerning for inflammation, atelectasis or possibly scarring.   Grossly stable multi-septated cystic lesion is seen involving upper pole of right kidney with  peripheral calcifications compared to prior exam of 2019, most likely representing benign etiology.   Aortic Atherosclerosis    08/18/2022 - 08/21/2022 Hospital Admission   Admitted due to multifocal pneumonia, treated with Influenza panel negative, COVID-negative, HIV negative, Complete course of antibiotics with Zithromax, Vantin     08/19/2022 Imaging   MR abdomen w wo contrast  Marked caudate lobe hypertrophy, highly suspicious for hepatic cirrhosis.   Numerous hypervascular masses throughout the right hepatic lobe, highly suspicious for multifocal hepatocellular carcinoma. Recommend correlation with AFP and consider tissue sampling.   Mild abdominal lymphadenopathy, with differential diagnosis including metastatic disease and reactive lymphadenopathy in setting of cirrhosis.   Bilateral lower lobe infiltrates, as better demonstrated on recent CT.    08/19/2022 Tumor Marker   AFP 4.1   08/27/2022 Initial Diagnosis   Cholangiocarcinoma  -08/20/22 Liver mass biopsy showed poorly differentiated carcinoma Immunohistochemical stains show that the poorly differentiated carcinoma is positive for CK7 and MOC-31, suggestive of a poorly differentiated adenocarcinoma.  Immunohistochemical stains for CK20, CDX2, HepPar 1 and arginase are negative.  Immunostain for glypican-3 shows very focal  likely nonspecific labeling.  This immunoprofile is nonspecific but can be compatible with a poorly differentiated primary cholangiocarcinoma in absence of any other lesions.   NGS showed BAP 1 mutation.  Case was presented at tumor board, I have also discussed with pathologist Dr.Rubinas,  IHC pattern is more consistent with adenocarcinoma, unlikely [place] [due to negative HepPar 1 and arginase], cholangiocarcinoma is favored, although this is a diagnosis of exclusion. Second opinion ar [facility]   08/27/2022 Cancer Staging   Staging form: Intrahepatic Bile Duct, AJCC 8th Edition - Clinical stage from  08/27/2022: Stage IV (cT2, cN1, cM1) - Signed by Earlie Server, MD on 09/13/2022 Stage prefix: Initial diagnosis   08/27/2022 Tumor Marker   CA 19.9  16 CEA 2.8  09/12/2022 Imaging   PET scan showed 1. Large right hepatic lobe mass is hypermetabolic and consistent with known cholangiocarcinoma. 2. Scattered borderline enlarged upper abdominal lymph nodes do not show any significant FDG uptake. 3. 4 hypermetabolic bone lesions consistent with metastatic disease. 4. Progressive diffuse airspace process in the left lung could be due to atypical pneumonia, pulmonary hemorrhage or left-sided aspiration. 5. Aortic atherosclerosis.  09/12/2022 I had a phone discussion with patient after his PET scan resulted.  Recommend systemic chemotherapy.  Patient would like to defer until his second opinion visit at [facility].   09/15/2022 Imaging   CT angio chest pulmonary embolism protocol showed 1. No evidence for pulmonary embolism. 2. Progression of bilateral multifocal patchy ground-glass and interstitial opacities, left greater than right. Findings are concerning for multifocal pneumonia. 3. Air-fluid levels in the left lower lobe bronchi worrisome for aspiration. 4. Mediastinal and hilar lymphadenopathy has increased in size and number when compared to the prior study, likely reactive.5. Cardiomegaly. 6. Findings compatible with pulmonary artery hypertension.7. Stable right liver mass and complex right renal cystic lesion. 8. Stable enlargement of the left thyroid.   09/15/2022 - 09/17/2022 Hospital Admission   Patient was admitted due to shortness of breath, CT showed progression of bilateral multifocal patchy groundglass and interstitial opacities.  Left greater than right.  Concerning for multifocal pneumonia.  Patient was started on broad-spectrum antibiotics with significant improvement.  He was discharged on oral antibiotics.   09/19/2022 Miscellaneous   Patient went to [facility] for [facility] opinion.  He was  seen by Stanton Kidney, who felt that the presentation is consistent with metastatic cholangiocarcinoma, and agrees with the plan of cisplatin/gemcitabine/durvalumab..    10/11/2022 -  Chemotherapy   Patient is on Treatment Plan : Cisplatin D1,8 + Gemcitabine + Durvalumab  D1,8 q21d x      Patient has had baseline hearing testing yesterday.  He has finished an extended course of levaquin for lung infiltrates. Follows up with pulmonology. He feels SOB is better. Has been to chemotherapy class.  No new complains.   Review of Systems  Constitutional:  Positive for fatigue. Negative for appetite change, chills and fever.  HENT:   Negative for hearing loss and voice change.   Eyes:  Negative for eye problems.  Respiratory:  Negative for chest tightness and cough.   Cardiovascular:  Negative for chest pain.  Gastrointestinal:  Negative for abdominal distention, abdominal pain and blood in stool.  Endocrine: Negative for hot flashes.  Genitourinary:  Negative for difficulty urinating and frequency.   Musculoskeletal:  Negative for arthralgias.  Skin:  Negative for itching and rash.  Neurological:  Negative for extremity weakness.  Hematological:  Negative for adenopathy. Bruises/bleeds easily.  Psychiatric/Behavioral:  Negative for confusion.     MEDICAL HISTORY:  Past Medical History:  Diagnosis Date   Antiphospholipid antibody syndrome (HCC)    Arthritis    Diabetes mellitus without complication (HCC)    DVT (deep venous thrombosis) (HCC)    Lt leg   Dyspnea    Elevated lipids    Erythrocytosis    Hyperlipidemia    Hypertension    Lower extremity edema    Multinodular thyroid    Post-thrombotic syndrome     SURGICAL HISTORY: Past Surgical History:  Procedure Laterality Date   COLONOSCOPY WITH PROPOFOL N/A 09/23/2018   Per Dr. Alice Reichert, polyp, repeat in 3 yrs    EYE MUSCLE SURGERY     JOINT REPLACEMENT Left 07/22/2017   TOTAL HIP ARTHROPLASTY  Left 07/22/2017   Procedure:  TOTAL HIP ARTHROPLASTY;  Surgeon: Dereck Leep, MD;  Location: [facility] ORS;  Service: Orthopedics;  Laterality: Left;    SOCIAL HISTORY: Social History   Socioeconomic History   Marital status: Married    Spouse name: Not on file   Number of children: Not on file   Years of education: Not on file   Highest education level: Not on file  Occupational History   Not on file  Tobacco Use   Smoking status: Former    Packs/day: 0.50    Years: 6.00    Total pack years: 3.00    Types: Cigarettes    Quit date: 13    Years since quitting: 41.9   Smokeless tobacco: Never   Tobacco comments:    Pt states when he did smoke he smoked at most 1 ppd. ALS 09/26/2022  Vaping Use   Vaping Use: Never used  Substance and Sexual Activity   Alcohol use: Yes    Comment: 2-3 nights a week   Drug use: No   Sexual activity: Not on file  Other Topics Concern   Not on file  Social History Narrative   Not on file   Social Determinants of Health   Financial Resource Strain: Low Risk  (08/27/2022)   Overall Financial Resource Strain (CARDIA)    Difficulty of Paying Living Expenses: Not very hard  Food Insecurity: No Food Insecurity (09/15/2022)   Hunger Vital Sign    Worried About Running Out of Food in the Last Year: Never true    Ran Out of Food in the Last Year: Never true  Transportation Needs: No Transportation Needs (09/15/2022)   PRAPARE - Hydrologist (Medical): No    Lack of Transportation (Non-Medical): No  Physical Activity: Insufficiently Active (08/27/2022)   Exercise Vital Sign    Days of Exercise per Week: 3 days    Minutes of Exercise per Session: 30 min  Stress: Stress Concern Present (08/27/2022)   [place]    Feeling of Stress : Rather much  Social Connections: Socially Integrated (08/27/2022)   Social Connection and Isolation Panel [NHANES]    Frequency of Communication with  Friends and Family: More than three times a week    Frequency of Social Gatherings with Friends and Family: More than three times a week    Attends Religious Services: 1 to 4 times per year    Active Member of [employer] or Organizations: Yes    Attends Archivist Meetings: Never    Marital Status: Married  Human resources officer Violence: Not At Risk (09/15/2022)   Humiliation, Afraid, Rape, and Kick questionnaire    Fear of Current or Ex-Partner: No    Emotionally Abused: No    Physically Abused: No    Sexually Abused: No    FAMILY HISTORY: Family History  Problem Relation Age of Onset   Breast cancer Mother    Emphysema Father    Colon cancer Maternal Grandmother     ALLERGIES:  has No Known Allergies.  MEDICATIONS:  Current Outpatient Medications  Medication Sig Dispense Refill   albuterol (VENTOLIN HFA) 108 (90 Base) MCG/ACT inhaler Inhale 2 puffs into the lungs every 4 (four) hours as needed. 18 g 1   amLODipine (NORVASC) 10 MG tablet Take 1 tablet (10 mg total) by mouth daily. 30 tablet 0   dexamethasone (DECADRON) 4 MG tablet Take 2 tablets daily x  3 days starting the day after cisplatin chemotherapy. Take with food. 30 tablet 1   enoxaparin (LOVENOX) 120 MG/0.8ML injection Inject 0.8 mLs (120 mg total) into the skin every 12 (twelve) hours for 28 days. 44.8 mL 0   glipiZIDE (GLUCOTROL) 10 MG tablet Take 1 tablet (10 mg total) by mouth 2 (two) times daily before a meal. (Patient taking differently: Take 10 mg by mouth daily before breakfast.) 180 tablet 3   glucose blood test strip 1 each by Other route as needed for other. accu chek aviva plusUse as instructed     LORazepam (ATIVAN) 0.5 MG tablet Take 1 tablet (0.5 mg total) by mouth every 8 (eight) hours as needed for anxiety (nausea vomiting). 30 tablet 0   magnesium oxide (MAG-OX) 400 (240 Mg) MG tablet Take 400 mg by mouth in the morning.     metFORMIN (GLUCOPHAGE) 500 MG tablet TAKE TWO TABLETS BY MOUTH EVERY MORNING  WITH BREAKFAST (Patient taking differently: Take 1,000 mg by mouth daily with breakfast.) 180 tablet 0   MUCINEX D MAX STRENGTH [phone] MG TB12 Take 1,200 mg by mouth 2 (two) times daily as needed (for congestion or to loosen mucous in the chest).     pravastatin (PRAVACHOL) 20 MG tablet Take 1 tablet (20 mg total) by mouth daily. 90 tablet 3   prochlorperazine (COMPAZINE) 10 MG tablet Take 1 tablet (10 mg total) by mouth every 6 (six) hours as needed (Nausea or vomiting). 30 tablet 1   tadalafil (CIALIS) 20 MG tablet Take 0.5-1 tablets (10-20 mg total) by mouth every other day as needed for erectile dysfunction. 20 tablet 11   triamcinolone cream (KENALOG) 0.1 % Apply 1 Application topically 3 (three) times daily. (Patient taking differently: Apply 1 Application topically 3 (three) times daily as needed (for bilateral, below-the-knees skin irritation).) 453.6 g 1   benzonatate (TESSALON) 200 MG capsule Take 1 capsule (200 mg total) by mouth 3 (three) times daily as needed for cough. (Patient not taking: Reported on 10/10/2022) 30 capsule 3   ondansetron (ZOFRAN) 8 MG tablet Take 1 tablet (8 mg total) by mouth every 8 (eight) hours as needed for nausea or vomiting. Start on the third day after cisplatin. 30 tablet 1   No current facility-administered medications for this visit.     PHYSICAL EXAMINATION: ECOG PERFORMANCE STATUS: 1 - Symptomatic but completely ambulatory Vitals:   10/10/22 1348  BP: (!) 160/66  Pulse: 87  Temp: (!) 97.3 F (36.3 C)  SpO2: 96%   Filed Weights   10/10/22 1348  Weight: 284 lb 8 oz (129 kg)    Physical Exam Constitutional:      General: He is not in acute distress. HENT:     Head: Normocephalic and atraumatic.  Eyes:     General: No scleral icterus. Cardiovascular:     Rate and Rhythm: Normal rate.  Pulmonary:     Effort: Pulmonary effort is normal. No respiratory distress.     Breath sounds: No wheezing.  Abdominal:     General: Bowel sounds are  normal. There is no distension.  Musculoskeletal:        General: Normal range of motion.     Cervical back: Normal range of motion and neck supple.     Right lower leg: No edema.     Comments: Bilateral chronic lower extremity swelling, varicose veins  Skin:    General: Skin is warm and dry.     Findings: No erythema or rash.  Neurological:     Mental Status: He is alert and oriented to person, place, and time. Mental status is at baseline.     Cranial Nerves: No cranial nerve deficit.  Psychiatric:        Mood and Affect: Mood normal.     LABORATORY DATA:  I have reviewed the data as listed    Latest Ref Rng & Units 10/10/2022    1:38 PM 09/28/2022    9:46 AM 09/16/2022    6:33 AM  CBC  WBC 4.0 - 10.5 K/uL 5.1  5.9  4.2   Hemoglobin 13.0 - 17.0 g/dL 16.3  17.3  15.4   Hematocrit 39.0 - 52.0 % 48.0  52.1  47.3   Platelets 150 - 400 K/uL 188  223  171       Latest Ref Rng & Units 10/10/2022    1:38 PM 09/28/2022    9:46 AM 09/16/2022    6:33 AM  CMP  Glucose 70 - 99 mg/dL 133  147  119   BUN 8 - 23 mg/dL '6  8  9   '$ Creatinine 0.61 - 1.24 mg/dL 0.68  0.75  0.71   Sodium 135 - 145 mmol/L 138  138  136   Potassium 3.5 - 5.1 mmol/L 3.8  4.7  3.7   Chloride 98 - 111 mmol/L 103  101  107   CO2 22 - 32 mmol/L '26  28  23   '$ Calcium 8.9 - 10.3 mg/dL 8.2  8.9  8.2   Total Protein 6.5 - 8.1 g/dL 6.2  6.6  5.6   Total Bilirubin 0.3 - 1.2 mg/dL 0.5  0.6  1.1   Alkaline Phos 38 - 126 U/L 51  55  45   AST 15 - 41 U/L '26  24  20   '$ ALT 0 - 44 U/L '21  23  22     '$ RADIOGRAPHIC STUDIES: I have personally reviewed the radiological images as listed and agreed with the findings in the report. DG Swallowing Func-Speech Pathology  Result Date: 09/17/2022 Table formatting from the original result was not included. Objective Swallowing Evaluation: Type of Study: Bedside Swallow Evaluation  Patient Details Name: CALYN RUBI MRN: [id number] Date of Birth: 24-Feb-1961 Today's Date: 09/17/2022  Time: SLP Start Time (ACUTE ONLY): 1155 -SLP Stop Time (ACUTE ONLY): 1210 SLP Time Calculation (min) (ACUTE ONLY): 15 min Past Medical History: Past Medical History: Diagnosis Date  Antiphospholipid antibody syndrome ([place])   Arthritis   Diabetes mellitus without complication ([place])   DVT (deep venous thrombosis) ([place])   Lt leg  Dyspnea   Elevated lipids   Erythrocytosis   Hyperlipidemia   Hypertension   Lower extremity edema   Multinodular thyroid   Post-thrombotic syndrome  Past Surgical History: Past Surgical History: Procedure Laterality Date  COLONOSCOPY WITH PROPOFOL N/A 09/23/2018  Per Dr. Alice Reichert, polyp, repeat in 3 yrs   EYE MUSCLE SURGERY    JOINT REPLACEMENT Left 07/22/2017  TOTAL HIP ARTHROPLASTY Left 07/22/2017  Procedure: TOTAL HIP ARTHROPLASTY;  Surgeon: Dereck Leep, MD;  Location: [facility] ORS;  Service: Orthopedics;  Laterality: Left; HPI: Patient is a 61 y.o. male with PMH: cholangiocarcinoma (not on chemotherapy), antiphospholipid syndrome on Lovenox, diabetes, morbid obesity, history of DVT, hyperlipidemia, essential hypertension, chronic lower extremity stasis edema, osteoarthritis who presented to the ER with complaint of shortness of breath with exertion. Patient was worried he had a PNA and he had a similar episode about a month ago and  at that time he did have a PNA. He had a PET scan three days prior to current admission and was told he had a PNA. In ED, CXR showed multifocal PNA. SLP swallow evaluation ordered by MD to r/o aspiration PNA.  Subjective: pleasant  Recommendations for follow up therapy are one component of a multi-disciplinary discharge planning process, led by the attending physician.  Recommendations may be updated based on patient status, additional functional criteria and insurance authorization. Assessment / Plan / Recommendation   09/17/2022  12:20 PM Clinical Impressions Clinical Impression Patient presents with a normal oral, pharyngeal and esophageal phase of swallow as  per this MBS. No penetration or aspiration of any barium consistency during any of the phases of swallow was observed. Barium consistencies tested were: thin, 70m tablet and regular solid with pudding barium. Swallow initiation was timely, no pharyngeal residuals post swallows, UES opening normal and cervical esophageal phase swallow normal. Esophageal sweep did not reveal any barium stasis, retrograde movement of barium and 170mbarium tablet transited fully. SLP Visit Diagnosis Dysphagia, unspecified (R13.10) Impact on safety and function No limitations     09/17/2022  12:20 PM Treatment Recommendations Treatment Recommendations No treatment recommended at this time     09/17/2022  12:23 PM Prognosis Prognosis for Safe Diet Advancement Good   09/17/2022  12:20 PM Diet Recommendations SLP Diet Recommendations Regular solids;Thin liquid Liquid Administration via Cup;Straw Medication Administration Whole meds with liquid Postural Changes Seated upright at 90 degrees     09/17/2022  12:20 PM Other Recommendations Oral Care Recommendations Patient independent with oral care;Oral care BID Follow Up Recommendations No SLP follow up Functional Status Assessment Patient has not had a recent decline in their functional status   09/16/2022   5:01 PM Frequency and Duration  Speech Therapy Frequency (ACUTE ONLY) min 1 x/week Treatment Duration 1 week     09/17/2022  12:20 PM [facility]  09/17/2022  12:20 PM Pharyngeal Phase [facility]  09/17/2022  12:20 PM Cervical Esophageal Phase  Cervical Esophageal Phase WFL JoSonia BallerMA, CCC-SLP Speech Therapy                     CT Angio Chest PE W and/or Wo Contrast  Result Date: 09/15/2022 CLINICAL DATA:  Pneumonia and cough. High probability for PE. Cholangiocarcinoma. EXAM: CT ANGIOGRAPHY CHEST WITH CONTRAST TECHNIQUE: Multidetector CT imaging of the chest was performed using the standard protocol during bolus administration of intravenous  contrast. Multiplanar CT image reconstructions and MIPs were obtained to evaluate the vascular anatomy. RADIATION DOSE REDUCTION: This exam was performed according to the departmental dose-optimization program which includes automated exposure control, adjustment of the mA and/or kV according to patient size and/or use of iterative reconstruction technique. CONTRAST:  7574mMNIPAQUE IOHEXOL 350 MG/ML SOLN COMPARISON:  PET-CT 09/12/2022. FINDINGS: Cardiovascular: Heart is mildly enlarged. There is no pericardial effusion. Aorta is normal in size. There is adequate opacification of the pulmonary arteries. There is no evidence for pulmonary embolism. The main pulmonary artery is enlarged compatible with pulmonary artery hypertension. Mediastinum/Nodes: The left thyroid is heterogeneous and enlarged similar to the prior study. There are diffuse nonenlarged mediastinal and hilar lymph nodes present which have increased in size and number when compared to the prior study. Visualized esophagus is within normal limits. Lungs/Pleura: Bilateral multifocal patchy ground-glass and interstitial opacities are seen in the bilateral upper lobes and lower lobes, left greater than right.  Opacities are new in the right lung when compared to the prior exam. There is no pleural effusion or pneumothorax. Air-fluid levels are seen within left lower lobe bronchi, new from prior. No pleural effusion or pneumothorax. Upper Abdomen: Right liver mass grossly unchanged. Complex right renal cystic lesion grossly unchanged. Musculoskeletal: No chest wall abnormality. No acute or significant osseous findings. Review of the MIP images confirms the above findings. IMPRESSION: 1. No evidence for pulmonary embolism. 2. Progression of bilateral multifocal patchy ground-glass and interstitial opacities, left greater than right. Findings are concerning for multifocal pneumonia. 3. Air-fluid levels in the left lower lobe bronchi worrisome for aspiration.  4. Mediastinal and hilar lymphadenopathy has increased in size and number when compared to the prior study, likely reactive. 5. Cardiomegaly. 6. Findings compatible with pulmonary artery hypertension. 7. Stable right liver mass and complex right renal cystic lesion. 8. Stable enlargement of the left thyroid. Electronically Signed   By: Ronney Asters M.D.   On: 09/15/2022 20:21   DG Chest 2 View  Result Date: 09/15/2022 CLINICAL DATA:  Recently diagnosed pneumonia with hypoxia EXAM: CHEST - 2 VIEW COMPARISON:  Chest radiograph dated 08/20/2022, NM PET dated 09/12/2022 FINDINGS: Normal lung volumes. Diffuse bilateral airspace opacities. No definite pleural effusion or pneumothorax. Cardiac silhouette is obscured. The visualized skeletal structures are unremarkable. IMPRESSION: Diffuse bilateral airspace opacities, increased on the right when compared to prior nuclear medicine PET, which may represent multifocal pneumonia or pulmonary edema. Electronically Signed   By: Darrin Nipper M.D.   On: 09/15/2022 16:10   NM PET Image Initial (PI) Skull Base To Thigh  Result Date: 09/13/2022 CLINICAL DATA:  Initial treatment strategy for cholangiocarcinoma. EXAM: NUCLEAR MEDICINE PET SKULL BASE TO THIGH TECHNIQUE: 15.36 mCi F-18 FDG was injected intravenously. Full-ring PET imaging was performed from the skull base to thigh after the radiotracer. CT data was obtained and used for attenuation correction and anatomic localization. Fasting blood glucose: 115 mg/dl COMPARISON:  Foot MRI 08/19/2022 and CT scan 08/18/2022 FINDINGS: Mediastinal blood pool activity: SUV max 1.62 Liver activity: SUV max NA NECK: No hypermetabolic lymph nodes in the neck. Incidental CT findings: Left thyroid goiter but no areas of hypermetabolism. CHEST: No hypermetabolic mediastinal or hilar nodes. No suspicious pulmonary nodules on the CT scan. Incidental CT findings: Significant diffuse airspace process in the left lung progressive since the  prior chest CT. Findings could be due to atypical pneumonia, pulmonary hemorrhage or left-sided aspiration. Mild FDG uptake but below that of background mediastinal activity. No pleural effusions. ABDOMEN/PELVIS: Large right hepatic lobe mass is hypermetabolic with SUV max of 0.73 and consistent with known cholangiocarcinoma. This is largely in segment a but does extend in the segment 7. Scattered periportal and celiac axis lymph nodes not demonstrate any hypermetabolism. The largest node in the gastrohepatic ligament measures 19 mm but SUV max is 1.58. Small scattered retroperitoneal lymph nodes are stable. No hypermetabolism. Incidental CT findings: Stable benign bilateral renal cysts as demonstrated on prior MRI. No imaging evaluation or follow-up is necessary. Stable aortic and branch vessel calcifications. SKELETON: There are 4 hypermetabolic bone lesions noted. Small lesions associated with the right transverse process of T1 has an SUV max of 6.82. Small lytic lesion associated with the base of the left coracoid process has an SUV max of 4.25. Small lytic lesion associated with the right ischial tuberosity has an SUV max of 4.08. Larger lytic lesion involving the left ischial tuberosity has an SUV max of 3.23. Incidental CT  findings: None. IMPRESSION: 1. Large right hepatic lobe mass is hypermetabolic and consistent with known cholangiocarcinoma. 2. Scattered borderline enlarged upper abdominal lymph nodes do not show any significant FDG uptake. 3. 4 hypermetabolic bone lesions consistent with metastatic disease. 4. Progressive diffuse airspace process in the left lung could be due to atypical pneumonia, pulmonary hemorrhage or left-sided aspiration. 5. Aortic atherosclerosis. Aortic Atherosclerosis (ICD10-I70.0). Electronically Signed   By: Marijo Sanes M.D.   On: 09/13/2022 10:27

## 2022-10-10 NOTE — Assessment & Plan Note (Signed)
Chemotherapy plan as listed above 

## 2022-10-10 NOTE — Assessment & Plan Note (Signed)
#  Recurrent lower extremity DVT, secondary to antiphospholipid syndrome.-.  Persistent lupus anticoagulant positive. Recommend life time anticoagulation Patient was previously on Coumadin now on Lovenox. Recommend continue Lovenox 1 mg/kg twice daily given the upcoming possible bronchoscopy by his pulmonologist, as well as possible cytopenia secondary to chemotherapy.

## 2022-10-11 ENCOUNTER — Inpatient Hospital Stay: Payer: BC Managed Care – PPO

## 2022-10-11 VITALS — BP 159/62 | HR 78 | Temp 96.8°F | Resp 18

## 2022-10-11 DIAGNOSIS — Z5111 Encounter for antineoplastic chemotherapy: Secondary | ICD-10-CM | POA: Diagnosis not present

## 2022-10-11 DIAGNOSIS — Z7901 Long term (current) use of anticoagulants: Secondary | ICD-10-CM | POA: Diagnosis not present

## 2022-10-11 DIAGNOSIS — Z86718 Personal history of other venous thrombosis and embolism: Secondary | ICD-10-CM | POA: Diagnosis not present

## 2022-10-11 DIAGNOSIS — J189 Pneumonia, unspecified organism: Secondary | ICD-10-CM | POA: Diagnosis not present

## 2022-10-11 DIAGNOSIS — C221 Intrahepatic bile duct carcinoma: Secondary | ICD-10-CM

## 2022-10-11 DIAGNOSIS — E042 Nontoxic multinodular goiter: Secondary | ICD-10-CM | POA: Diagnosis not present

## 2022-10-11 DIAGNOSIS — Z5112 Encounter for antineoplastic immunotherapy: Secondary | ICD-10-CM | POA: Diagnosis not present

## 2022-10-11 DIAGNOSIS — D6861 Antiphospholipid syndrome: Secondary | ICD-10-CM | POA: Diagnosis not present

## 2022-10-11 MED ORDER — SODIUM CHLORIDE 0.9 % IV SOLN
10.0000 mg | Freq: Once | INTRAVENOUS | Status: AC
  Administered 2022-10-11: 10 mg via INTRAVENOUS
  Filled 2022-10-11: qty 10

## 2022-10-11 MED ORDER — PALONOSETRON HCL INJECTION 0.25 MG/5ML
0.2500 mg | Freq: Once | INTRAVENOUS | Status: AC
  Administered 2022-10-11: 0.25 mg via INTRAVENOUS
  Filled 2022-10-11: qty 5

## 2022-10-11 MED ORDER — SODIUM CHLORIDE 0.9 % IV SOLN
1000.0000 mg/m2 | Freq: Once | INTRAVENOUS | Status: AC
  Administered 2022-10-11: 2660 mg via INTRAVENOUS
  Filled 2022-10-11: qty 52.6

## 2022-10-11 MED ORDER — SODIUM CHLORIDE 0.9 % IV SOLN
150.0000 mg | Freq: Once | INTRAVENOUS | Status: AC
  Administered 2022-10-11: 150 mg via INTRAVENOUS
  Filled 2022-10-11: qty 150

## 2022-10-11 MED ORDER — SODIUM CHLORIDE 0.9 % IV SOLN
10.0000 mg/kg | Freq: Once | INTRAVENOUS | Status: AC
  Administered 2022-10-11: 1360 mg via INTRAVENOUS
  Filled 2022-10-11: qty 20

## 2022-10-11 MED ORDER — SODIUM CHLORIDE 0.9 % IV SOLN
25.0000 mg/m2 | Freq: Once | INTRAVENOUS | Status: AC
  Administered 2022-10-11: 66 mg via INTRAVENOUS
  Filled 2022-10-11: qty 66

## 2022-10-11 MED ORDER — POTASSIUM CHLORIDE IN NACL 20-0.9 MEQ/L-% IV SOLN
Freq: Once | INTRAVENOUS | Status: AC
  Filled 2022-10-11: qty 1000

## 2022-10-11 MED ORDER — SODIUM CHLORIDE 0.9 % IV SOLN
Freq: Once | INTRAVENOUS | Status: AC
  Filled 2022-10-11: qty 250

## 2022-10-11 MED ORDER — MAGNESIUM SULFATE 2 GM/50ML IV SOLN
2.0000 g | Freq: Once | INTRAVENOUS | Status: AC
  Administered 2022-10-11: 2 g via INTRAVENOUS
  Filled 2022-10-11: qty 50

## 2022-10-11 NOTE — Patient Instructions (Signed)
[facility] AT [place]  Discharge Instructions: Thank you for choosing [facility] to provide your oncology and hematology care.  If you have a lab appointment with the [facility], please go directly to the [facility] and check in at the registration area.  Wear comfortable clothing and clothing appropriate for easy access to any Portacath or PICC line.   We strive to give you quality time with your provider. You may need to reschedule your appointment if you arrive late (15 or more minutes).  Arriving late affects you and other patients whose appointments are after yours.  Also, if you miss three or more appointments without notifying the office, you may be dismissed from the clinic at the provider's discretion.      For prescription refill requests, have your pharmacy contact our office and allow 72 hours for refills to be completed.    Today you received the following chemotherapy and/or immunotherapy agents CISPALTIN, GEMZAR, IMFINZI      To help prevent nausea and vomiting after your treatment, we encourage you to take your nausea medication as directed.  BELOW ARE SYMPTOMS THAT SHOULD BE REPORTED IMMEDIATELY: *FEVER GREATER THAN 100.4 F (38 C) OR HIGHER *CHILLS OR SWEATING *NAUSEA AND VOMITING THAT IS NOT CONTROLLED WITH YOUR NAUSEA MEDICATION *UNUSUAL SHORTNESS OF BREATH *UNUSUAL BRUISING OR BLEEDING *URINARY PROBLEMS (pain or burning when urinating, or frequent urination) *BOWEL PROBLEMS (unusual diarrhea, constipation, pain near the anus) TENDERNESS IN MOUTH AND THROAT WITH OR WITHOUT PRESENCE OF ULCERS (sore throat, sores in mouth, or a toothache) UNUSUAL RASH, SWELLING OR PAIN  UNUSUAL VAGINAL DISCHARGE OR ITCHING   Items with * indicate a potential emergency and should be followed up as soon as possible or go to the Emergency Department if any problems should occur.  Please show the CHEMOTHERAPY ALERT CARD or IMMUNOTHERAPY ALERT CARD  at check-in to the Emergency Department and triage nurse.  Should you have questions after your visit or need to cancel or reschedule your appointment, please contact [facility] [place] AT [place]  [phone] and follow the prompts.  Office hours are 8:00 a.m. to 4:30 p.m. Monday - Friday. Please note that voicemails left after 4:00 p.m. may not be returned until the following business day.  We are closed weekends and major holidays. You have access to a nurse at all times for urgent questions. Please call the main number to the clinic [phone] and follow the prompts.  For any non-urgent questions, you may also contact your provider using MyChart. We now offer e-Visits for anyone 29 and older to request care online for non-urgent symptoms. For details visit [email or web address].   Also download the MyChart app! Go to the app store, search "MyChart", open the app, select [facility], and log in with your MyChart username and password.  Masks are optional in the cancer centers. If you would like for your care team to wear a mask while they are taking care of you, please let them know. For doctor visits, patients may have with them one support person who is at least 63 years old. At this time, visitors are not allowed in the infusion area.   Cisplatin Injection What is this medication? CISPLATIN (SIS pla tin) treats some types of cancer. It works by slowing down the growth of cancer cells. This medicine may be used for other purposes; ask your health care provider or pharmacist if you have questions. COMMON BRAND NAME(S): Platinol, Platinol -AQ What should I  tell my care team before I take this medication? They need to know if you have any of these conditions: Eye disease, vision problems Hearing problems Kidney disease Low blood counts, such as low white cells, platelets, or red blood cells Tingling of the fingers or toes, or other nerve disorder An unusual or allergic  reaction to cisplatin, carboplatin, oxaliplatin, other medications, foods, dyes, or preservatives If you or your partner are pregnant or trying to get pregnant Breast-feeding How should I use this medication? This medication is injected into a vein. It is given by your care team in a hospital or clinic setting. Talk to your care team about the use of this medication in children. Special care may be needed. Overdosage: If you think you have taken too much of this medicine contact a poison control center or emergency room at once. NOTE: This medicine is only for you. Do not share this medicine with others. What if I miss a dose? Keep appointments for follow-up doses. It is important not to miss your dose. Call your care team if you are unable to keep an appointment. What may interact with this medication? Do not take this medication with any of the following: Live virus vaccines This medication may also interact with the following: Certain antibiotics, such as amikacin, gentamicin, neomycin, polymyxin B, streptomycin, tobramycin, vancomycin Foscarnet This list may not describe all possible interactions. Give your health care provider a list of all the medicines, herbs, non-prescription drugs, or dietary supplements you use. Also tell them if you smoke, drink alcohol, or use illegal drugs. Some items may interact with your medicine. What should I watch for while using this medication? Your condition will be monitored carefully while you are receiving this medication. You may need blood work done while taking this medication. This medication may make you feel generally unwell. This is not uncommon, as chemotherapy can affect healthy cells as well as cancer cells. Report any side effects. Continue your course of treatment even though you feel ill unless your care team tells you to stop. This medication may increase your risk of getting an infection. Call your care team for advice if you get a fever,  chills, sore throat, or other symptoms of a cold or flu. Do not treat yourself. Try to avoid being around people who are sick. Avoid taking medications that contain aspirin, acetaminophen, ibuprofen, naproxen, or ketoprofen unless instructed by your care team. These medications may hide a fever. This medication may increase your risk to bruise or bleed. Call your care team if you notice any unusual bleeding. Be careful brushing or flossing your teeth or using a toothpick because you may get an infection or bleed more easily. If you have any dental work done, tell your dentist you are receiving this medication. Drink fluids as directed while you are taking this medication. This will help protect your kidneys. Call your care team if you get diarrhea. Do not treat yourself. Talk to your care team if you or your partner wish to become pregnant or think you might be pregnant. This medication can cause serious birth defects if taken during pregnancy and for 14 months after the last dose. A negative pregnancy test is required before starting this medication. A reliable form of contraception is recommended while taking this medication and for 14 months after the last dose. Talk to your care team about effective forms of contraception. Do not father a child while taking this medication and for 11 months after the last  dose. Use a condom during sex during this time period. Do not breast-feed while taking this medication. This medication may cause infertility. Talk to your care team if you are concerned about your fertility. What side effects may I notice from receiving this medication? Side effects that you should report to your care team as soon as possible: Allergic reactions--skin rash, itching, hives, swelling of the face, lips, tongue, or throat Eye pain, change in vision, vision loss Hearing loss, ringing in ears Infection--fever, chills, cough, sore throat, wounds that don't heal, pain or trouble when  passing urine, general feeling of discomfort or being unwell Kidney injury--decrease in the amount of urine, swelling of the ankles, hands, or feet Low red blood cell level--unusual weakness or fatigue, dizziness, headache, trouble breathing Painful swelling, warmth, or redness of the skin, blisters or sores at the infusion site Pain, tingling, or numbness in the hands or feet Unusual bruising or bleeding Side effects that usually do not require medical attention (report to your care team if they continue or are bothersome): Hair loss Nausea Vomiting This list may not describe all possible side effects. Call your doctor for medical advice about side effects. You may report side effects to FDA at 1-800-FDA-1088. Where should I keep my medication? This medication is given in a hospital or clinic. It will not be stored at home. NOTE: This sheet is a summary. It may not cover all possible information. If you have questions about this medicine, talk to your doctor, pharmacist, or health care provider.  2023 Elsevier/Gold Standard (2022-02-09 00:00:00)  Gemcitabine Injection What is this medication? GEMCITABINE (jem SYE ta been) treats some types of cancer. It works by slowing down the growth of cancer cells. This medicine may be used for other purposes; ask your health care provider or pharmacist if you have questions. COMMON BRAND NAME(S): Gemzar, Infugem What should I tell my care team before I take this medication? They need to know if you have any of these conditions: Blood disorders Infection Kidney disease Liver disease Lung or breathing disease, such as asthma or COPD Recent or ongoing radiation therapy An unusual or allergic reaction to gemcitabine, other medications, foods, dyes, or preservatives If you or your partner are pregnant or trying to get pregnant Breast-feeding How should I use this medication? This medication is injected into a vein. It is given by your care team in  a hospital or clinic setting. Talk to your care team about the use of this medication in children. Special care may be needed. Overdosage: If you think you have taken too much of this medicine contact a poison control center or emergency room at once. NOTE: This medicine is only for you. Do not share this medicine with others. What if I miss a dose? Keep appointments for follow-up doses. It is important not to miss your dose. Call your care team if you are unable to keep an appointment. What may interact with this medication? Interactions have not been studied. This list may not describe all possible interactions. Give your health care provider a list of all the medicines, herbs, non-prescription drugs, or dietary supplements you use. Also tell them if you smoke, drink alcohol, or use illegal drugs. Some items may interact with your medicine. What should I watch for while using this medication? Your condition will be monitored carefully while you are receiving this medication. This medication may make you feel generally unwell. This is not uncommon, as chemotherapy can affect healthy cells as well as  cancer cells. Report any side effects. Continue your course of treatment even though you feel ill unless your care team tells you to stop. In some cases, you may be given additional medications to help with side effects. Follow all directions for their use. This medication may increase your risk of getting an infection. Call your care team for advice if you get a fever, chills, sore throat, or other symptoms of a cold or flu. Do not treat yourself. Try to avoid being around people who are sick. This medication may increase your risk to bruise or bleed. Call your care team if you notice any unusual bleeding. Be careful brushing or flossing your teeth or using a toothpick because you may get an infection or bleed more easily. If you have any dental work done, tell your dentist you are receiving this  medication. Avoid taking medications that contain aspirin, acetaminophen, ibuprofen, naproxen, or ketoprofen unless instructed by your care team. These medications may hide a fever. Talk to your care team if you or your partner wish to become pregnant or think you might be pregnant. This medication can cause serious birth defects if taken during pregnancy and for 6 months after the last dose. A negative pregnancy test is required before starting this medication. A reliable form of contraception is recommended while taking this medication and for 6 months after the last dose. Talk to your care team about effective forms of contraception. Do not father a child while taking this medication and for 3 months after the last dose. Use a condom while having sex during this time period. Do not breastfeed while taking this medication and for at least 1 week after the last dose. This medication may cause infertility. Talk to your care team if you are concerned about your fertility. What side effects may I notice from receiving this medication? Side effects that you should report to your care team as soon as possible: Allergic reactions--skin rash, itching, hives, swelling of the face, lips, tongue, or throat Capillary leak syndrome--stomach or muscle pain, unusual weakness or fatigue, feeling faint or lightheaded, decrease in the amount of urine, swelling of the ankles, hands, or feet, trouble breathing Infection--fever, chills, cough, sore throat, wounds that don't heal, pain or trouble when passing urine, general feeling of discomfort or being unwell Liver injury--right upper belly pain, loss of appetite, nausea, light-colored stool, dark yellow or brown urine, yellowing skin or eyes, unusual weakness or fatigue Low red blood cell level--unusual weakness or fatigue, dizziness, headache, trouble breathing Lung injury--shortness of breath or trouble breathing, cough, spitting up blood, chest pain, fever Stomach  pain, bloody diarrhea, pale skin, unusual weakness or fatigue, decrease in the amount of urine, which may be signs of hemolytic uremic syndrome Sudden and severe headache, confusion, change in vision, seizures, which may be signs of posterior reversible encephalopathy syndrome (PRES) Unusual bruising or bleeding Side effects that usually do not require medical attention (report to your care team if they continue or are bothersome): Diarrhea Drowsiness Hair loss Nausea Pain, redness, or swelling with sores inside the mouth or throat Vomiting This list may not describe all possible side effects. Call your doctor for medical advice about side effects. You may report side effects to FDA at 1-800-FDA-1088. Where should I keep my medication? This medication is given in a hospital or clinic. It will not be stored at home. NOTE: This sheet is a summary. It may not cover all possible information. If you have questions about this medicine,  talk to your doctor, pharmacist, or health care provider.  2023 Elsevier/Gold Standard (2007-12-06 00:00:00)  Durvalumab Injection What is this medication? DURVALUMAB (dur VAL ue mab) treats some types of cancer. It works by helping your immune system slow or stop the spread of cancer cells. It is a monoclonal antibody. This medicine may be used for other purposes; ask your health care provider or pharmacist if you have questions. COMMON BRAND NAME(S): IMFINZI What should I tell my care team before I take this medication? They need to know if you have any of these conditions: Allogeneic stem cell transplant (uses someone else's stem cells) Autoimmune diseases, such as Crohn disease, ulcerative colitis, lupus History of chest radiation Nervous system problems, such as Guillain-Barre syndrome, myasthenia gravis Organ transplant An unusual or allergic reaction to durvalumab, other medications, foods, dyes, or preservatives Pregnant or trying to get  pregnant Breast-feeding How should I use this medication? This medication is infused into a vein. It is given by your care team in a hospital or clinic setting. A special MedGuide will be given to you before each treatment. Be sure to read this information carefully each time. Talk to your care team about the use of this medication in children. Special care may be needed. Overdosage: If you think you have taken too much of this medicine contact a poison control center or emergency room at once. NOTE: This medicine is only for you. Do not share this medicine with others. What if I miss a dose? Keep appointments for follow-up doses. It is important not to miss your dose. Call your care team if you are unable to keep an appointment. What may interact with this medication? Interactions have not been studied. This list may not describe all possible interactions. Give your health care provider a list of all the medicines, herbs, non-prescription drugs, or dietary supplements you use. Also tell them if you smoke, drink alcohol, or use illegal drugs. Some items may interact with your medicine. What should I watch for while using this medication? Your condition will be monitored carefully while you are receiving this medication. You may need blood work while taking this medication. This medication may cause serious skin reactions. They can happen weeks to months after starting the medication. Contact your care team right away if you notice fevers or flu-like symptoms with a rash. The rash may be red or purple and then turn into blisters or peeling of the skin. You may also notice a red rash with swelling of the face, lips, or lymph nodes in your neck or under your arms. Tell your care team right away if you have any change in your eyesight. Talk to your care team if you may be pregnant. Serious birth defects can occur if you take this medication during pregnancy and for 3 months after the last dose. You will  need a negative pregnancy test before starting this medication. Contraception is recommended while taking this medication and for 3 months after the last dose. Your care team can help you find the option that works for you. Do not breastfeed while taking this medication and for 3 months after the last dose. What side effects may I notice from receiving this medication? Side effects that you should report to your care team as soon as possible: Allergic reactions--skin rash, itching, hives, swelling of the face, lips, tongue, or throat Dry cough, shortness of breath or trouble breathing Eye pain, redness, irritation, or discharge with blurry or decreased vision Heart muscle  inflammation--unusual weakness or fatigue, shortness of breath, chest pain, fast or irregular heartbeat, dizziness, swelling of the ankles, feet, or hands Hormone gland problems--headache, sensitivity to light, unusual weakness or fatigue, dizziness, fast or irregular heartbeat, increased sensitivity to cold or heat, excessive sweating, constipation, hair loss, increased thirst or amount of urine, tremors or shaking, irritability Infusion reactions--chest pain, shortness of breath or trouble breathing, feeling faint or lightheaded Kidney injury (glomerulonephritis)--decrease in the amount of urine, red or dark brown urine, foamy or bubbly urine, swelling of the ankles, hands, or feet Liver injury--right upper belly pain, loss of appetite, nausea, light-colored stool, dark yellow or brown urine, yellowing skin or eyes, unusual weakness or fatigue Pain, tingling, or numbness in the hands or feet, muscle weakness, change in vision, confusion or trouble speaking, loss of balance or coordination, trouble walking, seizures Rash, fever, and swollen lymph nodes Redness, blistering, peeling, or loosening of the skin, including inside the mouth Sudden or severe stomach pain, bloody diarrhea, fever, nausea, vomiting Side effects that usually  do not require medical attention (report these to your care team if they continue or are bothersome): Bone, joint, or muscle pain Diarrhea Fatigue Loss of appetite Nausea Skin rash This list may not describe all possible side effects. Call your doctor for medical advice about side effects. You may report side effects to FDA at 1-800-FDA-1088. Where should I keep my medication? This medication is given in a hospital or clinic. It will not be stored at home. NOTE: This sheet is a summary. It may not cover all possible information. If you have questions about this medicine, talk to your doctor, pharmacist, or health care provider.  2023 Elsevier/Gold Standard (2022-02-05 00:00:00)

## 2022-10-11 NOTE — Progress Notes (Signed)
Per Dr. Tasia Catchings okay to run post hydration fluids with Cisplatin.

## 2022-10-12 ENCOUNTER — Encounter: Payer: Self-pay | Admitting: Unknown Physician Specialty

## 2022-10-12 ENCOUNTER — Telehealth: Payer: Self-pay

## 2022-10-12 ENCOUNTER — Ambulatory Visit
Admission: RE | Admit: 2022-10-12 | Discharge: 2022-10-12 | Disposition: A | Discharge status: Home / Self Care | Payer: BC Managed Care – PPO | Source: Ambulatory Visit | Attending: Pulmonary Disease | Admitting: Pulmonary Disease

## 2022-10-12 DIAGNOSIS — R911 Solitary pulmonary nodule: Secondary | ICD-10-CM | POA: Diagnosis not present

## 2022-10-12 DIAGNOSIS — J189 Pneumonia, unspecified organism: Secondary | ICD-10-CM | POA: Diagnosis not present

## 2022-10-12 DIAGNOSIS — I700 Atherosclerosis of aorta: Secondary | ICD-10-CM | POA: Diagnosis not present

## 2022-10-12 DIAGNOSIS — J181 Lobar pneumonia, unspecified organism: Secondary | ICD-10-CM

## 2022-10-12 NOTE — Telephone Encounter (Signed)
Telephone call to patient for follow up after receiving first infusion.   Patient states infusion went great.  States eating good and drinking plenty of fluids.   Denies any nausea or vomiting.  Encouraged patient to call for any concerns or questions. 

## 2022-10-15 ENCOUNTER — Other Ambulatory Visit: Payer: BC Managed Care – PPO

## 2022-10-15 ENCOUNTER — Ambulatory Visit: Payer: BC Managed Care – PPO | Admitting: Oncology

## 2022-10-15 ENCOUNTER — Other Ambulatory Visit: Payer: Self-pay

## 2022-10-15 DIAGNOSIS — C221 Intrahepatic bile duct carcinoma: Secondary | ICD-10-CM

## 2022-10-16 ENCOUNTER — Other Ambulatory Visit: Payer: Self-pay

## 2022-10-16 ENCOUNTER — Inpatient Hospital Stay: Payer: BC Managed Care – PPO

## 2022-10-16 ENCOUNTER — Ambulatory Visit: Payer: BC Managed Care – PPO | Admitting: Oncology

## 2022-10-16 ENCOUNTER — Encounter: Payer: Self-pay | Admitting: Oncology

## 2022-10-16 ENCOUNTER — Other Ambulatory Visit: Payer: BC Managed Care – PPO

## 2022-10-16 ENCOUNTER — Inpatient Hospital Stay (HOSPITAL_BASED_OUTPATIENT_CLINIC_OR_DEPARTMENT_OTHER): Payer: BC Managed Care – PPO | Admitting: Oncology

## 2022-10-16 ENCOUNTER — Ambulatory Visit: Payer: BC Managed Care – PPO

## 2022-10-16 VITALS — BP 146/70 | HR 84 | Temp 97.8°F | Resp 18 | Wt 289.1 lb

## 2022-10-16 DIAGNOSIS — R918 Other nonspecific abnormal finding of lung field: Secondary | ICD-10-CM | POA: Diagnosis not present

## 2022-10-16 DIAGNOSIS — C221 Intrahepatic bile duct carcinoma: Secondary | ICD-10-CM | POA: Diagnosis not present

## 2022-10-16 DIAGNOSIS — Z7901 Long term (current) use of anticoagulants: Secondary | ICD-10-CM | POA: Diagnosis not present

## 2022-10-16 DIAGNOSIS — Z5112 Encounter for antineoplastic immunotherapy: Secondary | ICD-10-CM | POA: Diagnosis not present

## 2022-10-16 DIAGNOSIS — E042 Nontoxic multinodular goiter: Secondary | ICD-10-CM | POA: Diagnosis not present

## 2022-10-16 DIAGNOSIS — J189 Pneumonia, unspecified organism: Secondary | ICD-10-CM | POA: Diagnosis not present

## 2022-10-16 DIAGNOSIS — Z5111 Encounter for antineoplastic chemotherapy: Secondary | ICD-10-CM | POA: Diagnosis not present

## 2022-10-16 DIAGNOSIS — Z86718 Personal history of other venous thrombosis and embolism: Secondary | ICD-10-CM | POA: Diagnosis not present

## 2022-10-16 DIAGNOSIS — J181 Lobar pneumonia, unspecified organism: Secondary | ICD-10-CM

## 2022-10-16 DIAGNOSIS — D6861 Antiphospholipid syndrome: Secondary | ICD-10-CM | POA: Diagnosis not present

## 2022-10-16 LAB — COMPREHENSIVE METABOLIC PANEL
ALT: 82 U/L — ABNORMAL HIGH (ref 0–44)
AST: 22 U/L (ref 15–41)
Albumin: 3.5 g/dL (ref 3.5–5.0)
Alkaline Phosphatase: 56 U/L (ref 38–126)
Anion gap: 6 (ref 5–15)
BUN: 11 mg/dL (ref 8–23)
CO2: 29 mmol/L (ref 22–32)
Calcium: 8.7 mg/dL — ABNORMAL LOW (ref 8.9–10.3)
Chloride: 103 mmol/L (ref 98–111)
Creatinine, Ser: 0.73 mg/dL (ref 0.61–1.24)
GFR, Estimated: >60 mL/min (ref >60)
Glucose, Bld: 76 mg/dL (ref 70–99)
Potassium: 4 mmol/L (ref 3.5–5.1)
Sodium: 138 mmol/L (ref 135–145)
Total Bilirubin: 0.5 mg/dL (ref 0.3–1.2)
Total Protein: 5.9 g/dL — ABNORMAL LOW (ref 6.5–8.1)

## 2022-10-16 LAB — CBC WITH DIFFERENTIAL/PLATELET
Abs Immature Granulocytes: 0.02 10*3/uL (ref 0.00–0.07)
Basophils Absolute: 0 10*3/uL (ref 0.0–0.1)
Basophils Relative: 0 %
Eosinophils Absolute: 0 10*3/uL (ref 0.0–0.5)
Eosinophils Relative: 1 %
HCT: 47.7 % (ref 39.0–52.0)
Hemoglobin: 16.2 g/dL (ref 13.0–17.0)
Immature Granulocytes: 0 %
Lymphocytes Relative: 9 %
Lymphs Abs: 0.5 10*3/uL — ABNORMAL LOW (ref 0.7–4.0)
MCH: 30.9 pg (ref 26.0–34.0)
MCHC: 34 g/dL (ref 30.0–36.0)
MCV: 91 fL (ref 80.0–100.0)
Monocytes Absolute: 0.1 10*3/uL (ref 0.1–1.0)
Monocytes Relative: 2 %
Neutro Abs: 4.7 10*3/uL (ref 1.7–7.7)
Neutrophils Relative %: 88 %
Platelets: 184 10*3/uL (ref 150–400)
RBC: 5.24 MIL/uL (ref 4.22–5.81)
RDW: 12.1 % (ref 11.5–15.5)
WBC: 5.4 10*3/uL (ref 4.0–10.5)
nRBC: 0 % (ref 0.0–0.2)

## 2022-10-16 LAB — MAGNESIUM: Magnesium: 2.1 mg/dL (ref 1.7–2.4)

## 2022-10-16 MED ORDER — LORAZEPAM 0.5 MG PO TABS: 0.5000 mg | ORAL_TABLET | Freq: Three times a day (TID) | ORAL | 0 refills | Status: DC | PRN

## 2022-10-16 MED ORDER — ONDANSETRON HCL 8 MG PO TABS: 8.0000 mg | ORAL_TABLET | Freq: Three times a day (TID) | ORAL | 1 refills | Status: DC | PRN

## 2022-10-16 MED ORDER — PROCHLORPERAZINE MALEATE 10 MG PO TABS: 10.0000 mg | ORAL_TABLET | Freq: Four times a day (QID) | ORAL | 1 refills | Status: DC | PRN

## 2022-10-16 MED ORDER — ENOXAPARIN SODIUM 120 MG/0.8ML IJ SOSY: 120.0000 mg | PREFILLED_SYRINGE | Freq: Two times a day (BID) | INTRAMUSCULAR | 0 refills | Status: DC

## 2022-10-16 NOTE — Progress Notes (Signed)
Pt here for follow up. No new concerns voiced.   

## 2022-10-16 NOTE — Assessment & Plan Note (Addendum)
Imaging findings and pathology results were reviewed with patient.  Clinical diagnosis is cholangiocarcinoma, although atypical with both normal CEA and CA 19.9 Labs are reviewed and discussed with patient. S/p cycle 1 D1Gemcitabine, Cisplatin and Durvalumab.  Overall he tolerates very well.  Proceed with D8 Cisplatin Gemcitabine

## 2022-10-16 NOTE — Assessment & Plan Note (Signed)
Recommend patient to start calcium supplementation.

## 2022-10-16 NOTE — Progress Notes (Signed)
CT looks a bit better but infiltrates not totally gone. Would recommend a repeat CT scan in 2-3 months or sooner as needed.

## 2022-10-16 NOTE — Assessment & Plan Note (Signed)
#  Recurrent lower extremity DVT, secondary to antiphospholipid syndrome.-.  Persistent lupus anticoagulant positive. Recommend life time anticoagulation Patient was previously on Coumadin now on Lovenox. Recommend continue Lovenox 1 mg/kg twice daily given the upcoming possible bronchoscopy by his pulmonologist, as well as possible cytopenia secondary to chemotherapy.

## 2022-10-16 NOTE — Assessment & Plan Note (Addendum)
He follows up with pulmonology. Levaquin was extended and patient finishes.  repeat CT shows mild improvement.

## 2022-10-16 NOTE — Progress Notes (Signed)
Hematology/Oncology Progress note [phone] [phone]      ASSESSMENT & PLAN:   Cancer Staging  Cholangiocarcinoma [facility]) Staging form: Intrahepatic Bile Duct, AJCC 8th Edition - Clinical stage from 08/27/2022: Stage IV (cT2, cN1, cM1) - Signed by Earlie Server, MD on 09/13/2022   Cholangiocarcinoma [facility]) Imaging findings and pathology results were reviewed with patient.  Clinical diagnosis is cholangiocarcinoma, although atypical with both normal CEA and CA 19.9 Labs are reviewed and discussed with patient. S/p cycle 1 D1Gemcitabine, Cisplatin and Durvalumab.  Overall he tolerates very well.  Proceed with D8 Cisplatin Gemcitabine   Antiphospholipid syndrome (HCC) #Recurrent lower extremity DVT, secondary to antiphospholipid syndrome.-.  Persistent lupus anticoagulant positive. Recommend life time anticoagulation Patient was previously on Coumadin now on Lovenox. Recommend continue Lovenox 1 mg/kg twice daily given the upcoming possible bronchoscopy by his pulmonologist, as well as possible cytopenia secondary to chemotherapy.  Hypocalcemia Recommend patient to start calcium supplementation.   Lung infiltrate on CT He follows up with pulmonology. Levaquin was extended and patient finishes.  repeat CT shows mild improvement.   No orders of the defined types were placed in this encounter.  Follow up  Per LOS  All questions were answered. The patient knows to call the clinic with any problems, questions or concerns.  Earlie Server, MD, PhD [facility] Hematology Oncology 10/16/2022   CHIEF COMPLAINTS/REASON FOR VISIT:  Follow up for right lower extremity DVT, antiphospholipid syndrome, metastatic cholangiocarcinoma. HISTORY OF PRESENTING ILLNESS:  Patient reports remote history of left lower extremity DVT in 2002.  He was initiated on Lovenox and bridged to Coumadin.  Patient took warfarin for 2 years before anticoagulation was stopped. 12/03/2021, patient  presented to emergency room for evaluation of right lower extremity pain and swelling for about a week.  Started on the inner side of right thigh and migrated to the right calf. + Associated with swelling.  Denies any recent injury, hospitalization, surgery.  He first noticed the symptoms after playing basketball with his grandson.   12/03/2021, right lower extremity ultrasound showed occlusive DVT extending from the mid aspect of the femoral vein through the imaged tibial vein.  Age-indeterminate. Patient was started on Xarelto. He was referred to establish care with vascular surgeon and was seen by [name] on 12/06/2021.  Shared decision was made not to proceed with embolectomy.  Continue anticoagulation. Patient was referred to hematology oncology for further evaluation.  Patient denies any family history of blood clots.  Denies any unintentional weight loss, fever or night sweats. 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  He sometimes stay in his car while waiting for next assessment appointment.  He reports the right lower extremity symptom has improved since the start of Xarelto.  No active bleeding events.  #12/18/2021 hypercoagulable work-up showed JAK2 V617F mutation negative, with reflex to other mutations CALR, MPL, JAK 2 Ex 12-15 mutations negative, negative anticardiolipin IgG and IgM antibodies, positive lupus anticoagulant, negative factor V Leiden mutation, negative prothrombin gene mutation, normal protein C activity, normal protein S antigen level. Patient was recommended to switch to Coumadin with INR goal of 2-3.  #03/15/2022, repeat lupus anticoagulant is persistently positive- + antiphospholipid syndrome.  Patient is currently on Lovenox 1 mg/kg twice daily.  admitted due to multifocal pneumonia, treated with Influenza panel negative, COVID-negative, HIV negative, Complete course of  antibiotics with Zithromax, Vantin   + HCV antibody positive, HCV RNA  quantification not detected. HCV RT PCR not detected.   INTERVAL HISTORY Christopher Marsh is a 61 y.o. male who has above history reviewed by me today presents for follow up visit for management of right lower extremity DVT and antiphospholipid syndrome, metastatic cholangiocarcinoma.  Oncology History  Cholangiocarcinoma ([place])  08/18/2022 Imaging   CT chest w contrast showed 1. Multifocal airspace disease in both lungs, left side greater than right. Findings are most compatible with multifocal pneumonia. 2. Multiple new hepatic lesions with enlarging lymph nodes in the upper abdomen and chest. Findings are concerning for metastatic disease. Based on the multifocal pneumonia, hepatic abscesses would also be in the differential diagnosis. Recommend further characterization of the abdomen and pelvis with CT with IV contrast. 3. Enlargement of the main pulmonary artery could be associated with pulmonary hypertension. 4. Coronary artery calcifications. 5. Multinodular goiter. Patient has known thyroid nodules and previous thyroid ultrasound.    08/18/2022 Imaging   CT abdomen pelvis w contrast  Interval development of 6.5 x 5.2 cm heterogeneously enhancing mass in right hepatic lobe. This is highly concerning for neoplasm or malignancy, and further evaluation with MRI is recommended.   Mildly enlarged periaortic adenopathy is noted, with the largest lymph node measuring 12 mm. Metastatic disease cannot be excluded. Mildly enlarged adenopathy is also noted in the gastrohepatic ligament, but this is unchanged compared to prior exam.   Stable bibasilar lung opacities are noted concerning for inflammation, atelectasis or possibly scarring.   Grossly stable multi-septated cystic lesion is seen involving upper pole of right kidney with peripheral calcifications compared to prior exam of 2019, most likely representing benign  etiology.   Aortic Atherosclerosis    08/18/2022 - 08/21/2022 Hospital Admission   Admitted due to multifocal pneumonia, treated with Influenza panel negative, COVID-negative, HIV negative, Complete course of antibiotics with Zithromax, Vantin     08/19/2022 Imaging   MR abdomen w wo contrast  Marked caudate lobe hypertrophy, highly suspicious for hepatic cirrhosis.   Numerous hypervascular masses throughout the right hepatic lobe, highly suspicious for multifocal hepatocellular carcinoma. Recommend correlation with AFP and consider tissue sampling.   Mild abdominal lymphadenopathy, with differential diagnosis including metastatic disease and reactive lymphadenopathy in setting of cirrhosis.   Bilateral lower lobe infiltrates, as better demonstrated on recent CT.    08/19/2022 Tumor Marker   AFP 4.1   08/27/2022 Initial Diagnosis   Cholangiocarcinoma  -08/20/22 Liver mass biopsy showed poorly differentiated carcinoma Immunohistochemical stains show that the poorly differentiated carcinoma is positive for CK7 and MOC-31, suggestive of a poorly differentiated adenocarcinoma.  Immunohistochemical stains for CK20, CDX2, HepPar 1 and arginase are negative.  Immunostain for glypican-3 shows very focal  likely nonspecific labeling.  This immunoprofile is nonspecific but can be compatible with a poorly differentiated primary cholangiocarcinoma in absence of any other lesions.   NGS showed BAP 1 mutation.  Case was presented at tumor board, I have also discussed with pathologist Dr.Rubinas,  IHC pattern is more consistent with adenocarcinoma, unlikely [place] [due to negative HepPar 1 and arginase], cholangiocarcinoma is favored, although this is a diagnosis of exclusion. Second opinion ar [facility]   08/27/2022 Cancer Staging   Staging form: Intrahepatic Bile Duct, AJCC 8th Edition - Clinical stage from 08/27/2022: Stage IV (cT2, cN1, cM1) - Signed by Earlie Server, MD on 09/13/2022 Stage prefix:  Initial diagnosis   08/27/2022 Tumor Marker   CA 19.9  16 CEA 2.8   09/12/2022 Imaging   PET scan showed 1. Large right  hepatic lobe mass is hypermetabolic and consistent with known cholangiocarcinoma. 2. Scattered borderline enlarged upper abdominal lymph nodes do not show any significant FDG uptake. 3. 4 hypermetabolic bone lesions consistent with metastatic disease. 4. Progressive diffuse airspace process in the left lung could be due to atypical pneumonia, pulmonary hemorrhage or left-sided aspiration. 5. Aortic atherosclerosis.  09/12/2022 I had a phone discussion with patient after his PET scan resulted.  Recommend systemic chemotherapy.  Patient would like to defer until his second opinion visit at [facility].   09/15/2022 Imaging   CT angio chest pulmonary embolism protocol showed 1. No evidence for pulmonary embolism. 2. Progression of bilateral multifocal patchy ground-glass and interstitial opacities, left greater than right. Findings are concerning for multifocal pneumonia. 3. Air-fluid levels in the left lower lobe bronchi worrisome for aspiration. 4. Mediastinal and hilar lymphadenopathy has increased in size and number when compared to the prior study, likely reactive.5. Cardiomegaly. 6. Findings compatible with pulmonary artery hypertension.7. Stable right liver mass and complex right renal cystic lesion. 8. Stable enlargement of the left thyroid.   09/15/2022 - 09/17/2022 Hospital Admission   Patient was admitted due to shortness of breath, CT showed progression of bilateral multifocal patchy groundglass and interstitial opacities.  Left greater than right.  Concerning for multifocal pneumonia.  Patient was started on broad-spectrum antibiotics with significant improvement.  He was discharged on oral antibiotics.   09/19/2022 Miscellaneous   Patient went to [facility] for second opinion.  He was seen by Stanton Kidney, who felt that the presentation is consistent with metastatic  cholangiocarcinoma, and agrees with the plan of cisplatin/gemcitabine/durvalumab..    10/11/2022 -  Chemotherapy   Patient is on Treatment Plan : Cisplatin D1,8 + Gemcitabine + Durvalumab  D1,8 q21d x      Patient has had baseline hearing testing yesterday.  He has finished an extended course of levaquin for lung infiltrates. Follows up with pulmonology. He feels SOB is better. Interim CT showed mild improvement.  He tolerated first cycle of chemotherapy. + fatigue after treatment. Today denies nausea vomiting diarrhea.  Appetite is slightly better, he has gain 5 pounds.  No new complaints.   Review of Systems  Constitutional:  Positive for fatigue. Negative for appetite change, chills and fever.  HENT:   Negative for hearing loss and voice change.   Eyes:  Negative for eye problems.  Respiratory:  Negative for chest tightness and cough.   Cardiovascular:  Negative for chest pain.  Gastrointestinal:  Negative for abdominal distention, abdominal pain and blood in stool.  Endocrine: Negative for hot flashes.  Genitourinary:  Negative for difficulty urinating and frequency.   Musculoskeletal:  Negative for arthralgias.  Skin:  Negative for itching and rash.  Neurological:  Negative for extremity weakness.  Hematological:  Negative for adenopathy. Bruises/bleeds easily.  Psychiatric/Behavioral:  Negative for confusion.     MEDICAL HISTORY:  Past Medical History:  Diagnosis Date   Antiphospholipid antibody syndrome (HCC)    Arthritis    Diabetes mellitus without complication (HCC)    DVT (deep venous thrombosis) (HCC)    Lt leg   Dyspnea    Elevated lipids    Erythrocytosis    Hyperlipidemia    Hypertension    Lower extremity edema    Multinodular thyroid    Post-thrombotic syndrome     SURGICAL HISTORY: Past Surgical History:  Procedure Laterality Date   COLONOSCOPY WITH PROPOFOL N/A 09/23/2018   Per Dr. Alice Reichert, polyp, repeat in 3 yrs  EYE MUSCLE SURGERY     JOINT  REPLACEMENT Left 07/22/2017   TOTAL HIP ARTHROPLASTY Left 07/22/2017   Procedure: TOTAL HIP ARTHROPLASTY;  Surgeon: Dereck Leep, MD;  Location: [facility] ORS;  Service: Orthopedics;  Laterality: Left;    SOCIAL HISTORY: Social History   Socioeconomic History   Marital status: Married    Spouse name: Not on file   Number of children: Not on file   Years of education: Not on file   Highest education level: Not on file  Occupational History   Not on file  Tobacco Use   Smoking status: Former    Packs/day: 0.50    Years: 6.00    Total pack years: 3.00    Types: Cigarettes    Quit date: 28    Years since quitting: 41.9   Smokeless tobacco: Never   Tobacco comments:    Pt states when he did smoke he smoked at most 1 ppd. ALS 09/26/2022  Vaping Use   Vaping Use: Never used  Substance and Sexual Activity   Alcohol use: Yes    Comment: 2-3 nights a week   Drug use: No   Sexual activity: Not on file  Other Topics Concern   Not on file  Social History Narrative   Not on file   Social Determinants of Health   Financial Resource Strain: Low Risk  (08/27/2022)   Overall Financial Resource Strain (CARDIA)    Difficulty of Paying Living Expenses: Not very hard  Food Insecurity: No Food Insecurity (09/15/2022)   Hunger Vital Sign    Worried About Running Out of Food in the Last Year: Never true    Ran Out of Food in the Last Year: Never true  Transportation Needs: No Transportation Needs (09/15/2022)   PRAPARE - Hydrologist (Medical): No    Lack of Transportation (Non-Medical): No  Physical Activity: Insufficiently Active (08/27/2022)   Exercise Vital Sign    Days of Exercise per Week: 3 days    Minutes of Exercise per Session: 30 min  Stress: Stress Concern Present (08/27/2022)   [place]    Feeling of Stress : Rather much  Social Connections: Socially Integrated (08/27/2022)    Social Connection and Isolation Panel [NHANES]    Frequency of Communication with Friends and Family: More than three times a week    Frequency of Social Gatherings with Friends and Family: More than three times a week    Attends Religious Services: 1 to 4 times per year    Active Member of [employer] or Organizations: Yes    Attends Archivist Meetings: Never    Marital Status: Married  Human resources officer Violence: Not At Risk (09/15/2022)   Humiliation, Afraid, Rape, and Kick questionnaire    Fear of Current or Ex-Partner: No    Emotionally Abused: No    Physically Abused: No    Sexually Abused: No    FAMILY HISTORY: Family History  Problem Relation Age of Onset   Breast cancer Mother    Emphysema Father    Colon cancer Maternal Grandmother     ALLERGIES:  has No Known Allergies.  MEDICATIONS:  Current Outpatient Medications  Medication Sig Dispense Refill   albuterol (VENTOLIN HFA) 108 (90 Base) MCG/ACT inhaler Inhale 2 puffs into the lungs every 4 (four) hours as needed. 18 g 1   amLODipine (NORVASC) 10 MG tablet Take 1 tablet (10 mg total) by mouth  daily. 30 tablet 0   Calcium Carb-Cholecalciferol (CALCIUM 500 + D PO) Take 1 tablet by mouth daily.     glipiZIDE (GLUCOTROL) 10 MG tablet Take 1 tablet (10 mg total) by mouth 2 (two) times daily before a meal. (Patient taking differently: Take 10 mg by mouth daily before breakfast.) 180 tablet 3   glucose blood test strip 1 each by Other route as needed for other. accu chek aviva plusUse as instructed     magnesium oxide (MAG-OX) 400 (240 Mg) MG tablet Take 400 mg by mouth in the morning.     metFORMIN (GLUCOPHAGE) 500 MG tablet TAKE TWO TABLETS BY MOUTH EVERY MORNING WITH BREAKFAST (Patient taking differently: Take 1,000 mg by mouth daily with breakfast.) 180 tablet 0   MUCINEX D MAX STRENGTH [phone] MG TB12 Take 1,200 mg by mouth 2 (two) times daily as needed (for congestion or to loosen mucous in the chest).      pravastatin (PRAVACHOL) 20 MG tablet Take 1 tablet (20 mg total) by mouth daily. 90 tablet 3   tadalafil (CIALIS) 20 MG tablet Take 0.5-1 tablets (10-20 mg total) by mouth every other day as needed for erectile dysfunction. 20 tablet 11   triamcinolone cream (KENALOG) 0.1 % Apply 1 Application topically 3 (three) times daily. (Patient taking differently: Apply 1 Application topically 3 (three) times daily as needed (for bilateral, below-the-knees skin irritation).) 453.6 g 1   benzonatate (TESSALON) 200 MG capsule Take 1 capsule (200 mg total) by mouth 3 (three) times daily as needed for cough. (Patient not taking: Reported on 10/10/2022) 30 capsule 3   dexamethasone (DECADRON) 4 MG tablet Take 2 tablets daily x 3 days starting the day after cisplatin chemotherapy. Take with food. (Patient not taking: Reported on 10/16/2022) 30 tablet 1   enoxaparin (LOVENOX) 120 MG/0.8ML injection Inject 0.8 mLs (120 mg total) into the skin every 12 (twelve) hours for 28 days. 44.8 mL 0   LORazepam (ATIVAN) 0.5 MG tablet Take 1 tablet (0.5 mg total) by mouth every 8 (eight) hours as needed for anxiety (nausea vomiting). 60 tablet 0   ondansetron (ZOFRAN) 8 MG tablet Take 1 tablet (8 mg total) by mouth every 8 (eight) hours as needed for nausea or vomiting. Start on the third day after cisplatin. 30 tablet 1   prochlorperazine (COMPAZINE) 10 MG tablet Take 1 tablet (10 mg total) by mouth every 6 (six) hours as needed (Nausea or vomiting). 30 tablet 1   No current facility-administered medications for this visit.     PHYSICAL EXAMINATION: ECOG PERFORMANCE STATUS: 1 - Symptomatic but completely ambulatory Vitals:   10/16/22 0922  BP: (!) 146/70  Pulse: 84  Resp: 18  Temp: 97.8 F (36.6 C)  SpO2: 95%   Filed Weights   10/16/22 0922  Weight: 289 lb 1.6 oz (131.1 kg)    Physical Exam Constitutional:      General: He is not in acute distress. HENT:     Head: Normocephalic and atraumatic.  Eyes:      General: No scleral icterus. Cardiovascular:     Rate and Rhythm: Normal rate.  Pulmonary:     Effort: Pulmonary effort is normal. No respiratory distress.     Breath sounds: No wheezing.  Abdominal:     General: Bowel sounds are normal. There is no distension.  Musculoskeletal:        General: Normal range of motion.     Cervical back: Normal range of motion and neck supple.  Right lower leg: No edema.     Comments: Bilateral chronic lower extremity swelling, varicose veins  Skin:    General: Skin is warm and dry.     Findings: No erythema or rash.  Neurological:     Mental Status: He is alert and oriented to person, place, and time. Mental status is at baseline.     Cranial Nerves: No cranial nerve deficit.  Psychiatric:        Mood and Affect: Mood normal.     LABORATORY DATA:  I have reviewed the data as listed    Latest Ref Rng & Units 10/16/2022    9:04 AM 10/10/2022    1:38 PM 09/28/2022    9:46 AM  CBC  WBC 4.0 - 10.5 K/uL 5.4  5.1  5.9   Hemoglobin 13.0 - 17.0 g/dL 16.2  16.3  17.3   Hematocrit 39.0 - 52.0 % 47.7  48.0  52.1   Platelets 150 - 400 K/uL 184  188  223       Latest Ref Rng & Units 10/16/2022    9:04 AM 10/10/2022    1:38 PM 09/28/2022    9:46 AM  CMP  Glucose 70 - 99 mg/dL 76  133  147   BUN 8 - 23 mg/dL '11  6  8   '$ Creatinine 0.61 - 1.24 mg/dL 0.73  0.68  0.75   Sodium 135 - 145 mmol/L 138  138  138   Potassium 3.5 - 5.1 mmol/L 4.0  3.8  4.7   Chloride 98 - 111 mmol/L 103  103  101   CO2 22 - 32 mmol/L '29  26  28   '$ Calcium 8.9 - 10.3 mg/dL 8.7  8.2  8.9   Total Protein 6.5 - 8.1 g/dL 5.9  6.2  6.6   Total Bilirubin 0.3 - 1.2 mg/dL 0.5  0.5  0.6   Alkaline Phos 38 - 126 U/L 56  51  55   AST 15 - 41 U/L '22  26  24   '$ ALT 0 - 44 U/L 82  21  23     RADIOGRAPHIC STUDIES: I have personally reviewed the radiological images as listed and agreed with the findings in the report. CT Chest Wo Contrast  Result Date: 10/14/2022 CLINICAL DATA:   Follow-up left-sided pneumonia. Recent diagnosis of cholangiocarcinoma. EXAM: CT CHEST WITHOUT CONTRAST TECHNIQUE: Multidetector CT imaging of the chest was performed following the standard protocol without IV contrast. RADIATION DOSE REDUCTION: This exam was performed according to the departmental dose-optimization program which includes automated exposure control, adjustment of the mA and/or kV according to patient size and/or use of iterative reconstruction technique. COMPARISON:  CT angiogram chest 09/15/2022 FINDINGS: Cardiovascular: No significant vascular findings. Normal heart size. No pericardial effusion. There are atherosclerotic calcifications of the aorta and coronary arteries. Mediastinum/Nodes: The left thyroid gland is heterogeneous and enlarged similar to the prior study. 3.2 cm rounded lesion seen in the left thyroid lobe image 7/25 which is similar to prior. There are no enlarged mediastinal or hilar lymph nodes identified. Visualized esophagus is within normal limits. Lungs/Pleura: There are patchy airspace and ground-glass opacities in the bilateral lower lobes, right middle lobe and left upper lobe. These have decreased in density and decreased in amount when compared to the prior study. Air-fluid levels are no longer identified within the left-sided bronchi. There is no pleural effusion or pneumothorax identified. Trachea and central airways are patent. Upper Abdomen: Hepatic lesions are not well  defined on this study measuring up 5.6 cm and grossly unchanged. Complex cystic lesion in the right kidney is also partially imaged and grossly unchanged. Musculoskeletal: No chest wall mass or suspicious bone lesions identified. IMPRESSION: 1. Bilateral airspace and ground-glass opacities have mildly decreased in amount and density when compared to the prior study. Findings are compatible with resolving multifocal pneumonia. Continued follow-up imaging recommended. 2. Stable enlarged heterogeneous  left thyroid gland with 3.2 cm nodule. This can be further evaluated with dedicated ultrasound. 3. Stable hepatic and right renal lesions, incompletely evaluated. Aortic Atherosclerosis (ICD10-I70.0). Electronically Signed   By: Ronney Asters M.D.   On: 10/14/2022 17:46   DG Swallowing Func-Speech Pathology  Result Date: 09/17/2022 Table formatting from the original result was not included. Objective Swallowing Evaluation: Type of Study: Bedside Swallow Evaluation  Patient Details Name: Christopher Marsh MRN: [id number] Date of Birth: 08-09-1961 Today's Date: 09/17/2022 Time: SLP Start Time (ACUTE ONLY): 1155 -SLP Stop Time (ACUTE ONLY): 1210 SLP Time Calculation (min) (ACUTE ONLY): 15 min Past Medical History: Past Medical History: Diagnosis Date  Antiphospholipid antibody syndrome ([place])   Arthritis   Diabetes mellitus without complication ([place])   DVT (deep venous thrombosis) ([place])   Lt leg  Dyspnea   Elevated lipids   Erythrocytosis   Hyperlipidemia   Hypertension   Lower extremity edema   Multinodular thyroid   Post-thrombotic syndrome  Past Surgical History: Past Surgical History: Procedure Laterality Date  COLONOSCOPY WITH PROPOFOL N/A 09/23/2018  Per Dr. Alice Reichert, polyp, repeat in 3 yrs   EYE MUSCLE SURGERY    JOINT REPLACEMENT Left 07/22/2017  TOTAL HIP ARTHROPLASTY Left 07/22/2017  Procedure: TOTAL HIP ARTHROPLASTY;  Surgeon: Dereck Leep, MD;  Location: [facility] ORS;  Service: Orthopedics;  Laterality: Left; HPI: Patient is a 61 y.o. male with PMH: cholangiocarcinoma (not on chemotherapy), antiphospholipid syndrome on Lovenox, diabetes, morbid obesity, history of DVT, hyperlipidemia, essential hypertension, chronic lower extremity stasis edema, osteoarthritis who presented to the ER with complaint of shortness of breath with exertion. Patient was worried he had a PNA and he had a similar episode about a month ago and at that time he did have a PNA. He had a PET scan three days prior to current admission and  was told he had a PNA. In ED, CXR showed multifocal PNA. SLP swallow evaluation ordered by MD to r/o aspiration PNA.  Subjective: pleasant  Recommendations for follow up therapy are one component of a multi-disciplinary discharge planning process, led by the attending physician.  Recommendations may be updated based on patient status, additional functional criteria and insurance authorization. Assessment / Plan / Recommendation   09/17/2022  12:20 PM Clinical Impressions Clinical Impression Patient presents with a normal oral, pharyngeal and esophageal phase of swallow as per this MBS. No penetration or aspiration of any barium consistency during any of the phases of swallow was observed. Barium consistencies tested were: thin, 64m tablet and regular solid with pudding barium. Swallow initiation was timely, no pharyngeal residuals post swallows, UES opening normal and cervical esophageal phase swallow normal. Esophageal sweep did not reveal any barium stasis, retrograde movement of barium and 13mbarium tablet transited fully. SLP Visit Diagnosis Dysphagia, unspecified (R13.10) Impact on safety and function No limitations     09/17/2022  12:20 PM Treatment Recommendations Treatment Recommendations No treatment recommended at this time     09/17/2022  12:23 PM Prognosis Prognosis for Safe Diet Advancement Good   09/17/2022  12:20 PM Diet Recommendations SLP Diet  Recommendations Regular solids;Thin liquid Liquid Administration via Cup;Straw Medication Administration Whole meds with liquid Postural Changes Seated upright at 90 degrees     09/17/2022  12:20 PM Other Recommendations Oral Care Recommendations Patient independent with oral care;Oral care BID Follow Up Recommendations No SLP follow up Functional Status Assessment Patient has not had a recent decline in their functional status   09/16/2022   5:01 PM Frequency and Duration  Speech Therapy Frequency (ACUTE ONLY) min 1 x/week Treatment Duration 1 week      09/17/2022  12:20 PM Oral Phase Oral Phase [facility]    09/17/2022  12:20 PM Pharyngeal Phase Pharyngeal Phase [facility]    09/17/2022  12:20 PM Cervical Esophageal Phase  Cervical Esophageal Phase WFL Sonia Baller, MA, CCC-SLP Speech Therapy

## 2022-10-17 ENCOUNTER — Ambulatory Visit: Payer: BC Managed Care – PPO

## 2022-10-17 MED FILL — Dexamethasone Sodium Phosphate Inj 100 MG/10ML: INTRAMUSCULAR | Qty: 1 | Status: AC

## 2022-10-17 MED FILL — Fosaprepitant Dimeglumine For IV Infusion 150 MG (Base Eq): INTRAVENOUS | Qty: 5 | Status: AC

## 2022-10-18 ENCOUNTER — Other Ambulatory Visit: Payer: Self-pay | Admitting: Oncology

## 2022-10-18 ENCOUNTER — Inpatient Hospital Stay: Payer: BC Managed Care – PPO

## 2022-10-18 VITALS — BP 170/71 | HR 72 | Temp 98.1°F | Resp 18

## 2022-10-18 DIAGNOSIS — C221 Intrahepatic bile duct carcinoma: Secondary | ICD-10-CM

## 2022-10-18 DIAGNOSIS — Z5111 Encounter for antineoplastic chemotherapy: Secondary | ICD-10-CM | POA: Diagnosis not present

## 2022-10-18 DIAGNOSIS — D6861 Antiphospholipid syndrome: Secondary | ICD-10-CM | POA: Diagnosis not present

## 2022-10-18 DIAGNOSIS — J189 Pneumonia, unspecified organism: Secondary | ICD-10-CM | POA: Diagnosis not present

## 2022-10-18 DIAGNOSIS — E042 Nontoxic multinodular goiter: Secondary | ICD-10-CM | POA: Diagnosis not present

## 2022-10-18 DIAGNOSIS — Z86718 Personal history of other venous thrombosis and embolism: Secondary | ICD-10-CM | POA: Diagnosis not present

## 2022-10-18 DIAGNOSIS — Z5112 Encounter for antineoplastic immunotherapy: Secondary | ICD-10-CM | POA: Diagnosis not present

## 2022-10-18 DIAGNOSIS — Z7901 Long term (current) use of anticoagulants: Secondary | ICD-10-CM | POA: Diagnosis not present

## 2022-10-18 MED ORDER — MAGNESIUM SULFATE 2 GM/50ML IV SOLN
2.0000 g | Freq: Once | INTRAVENOUS | Status: AC
  Administered 2022-10-18: 2 g via INTRAVENOUS
  Filled 2022-10-18: qty 50

## 2022-10-18 MED ORDER — SODIUM CHLORIDE 0.9 % IV SOLN
Freq: Once | INTRAVENOUS | Status: AC
  Filled 2022-10-18: qty 250

## 2022-10-18 MED ORDER — POTASSIUM CHLORIDE IN NACL 20-0.9 MEQ/L-% IV SOLN
Freq: Once | INTRAVENOUS | Status: AC
  Filled 2022-10-18: qty 1000

## 2022-10-18 MED ORDER — SODIUM CHLORIDE 0.9 % IV SOLN
2660.0000 mg | Freq: Once | INTRAVENOUS | Status: AC
  Administered 2022-10-18: 2662 mg via INTRAVENOUS
  Filled 2022-10-18: qty 52.6

## 2022-10-18 MED ORDER — PALONOSETRON HCL INJECTION 0.25 MG/5ML
0.2500 mg | Freq: Once | INTRAVENOUS | Status: AC
  Administered 2022-10-18: 0.25 mg via INTRAVENOUS
  Filled 2022-10-18: qty 5

## 2022-10-18 MED ORDER — SODIUM CHLORIDE 0.9 % IV SOLN
10.0000 mg | Freq: Once | INTRAVENOUS | Status: AC
  Administered 2022-10-18: 10 mg via INTRAVENOUS
  Filled 2022-10-18: qty 10

## 2022-10-18 MED ORDER — SODIUM CHLORIDE 0.9 % IV SOLN
25.0000 mg/m2 | Freq: Once | INTRAVENOUS | Status: AC
  Administered 2022-10-18: 66 mg via INTRAVENOUS
  Filled 2022-10-18: qty 66

## 2022-10-18 MED ORDER — SODIUM CHLORIDE 0.9 % IV SOLN
150.0000 mg | Freq: Once | INTRAVENOUS | Status: AC
  Administered 2022-10-18: 150 mg via INTRAVENOUS
  Filled 2022-10-18: qty 150

## 2022-10-18 NOTE — Patient Instructions (Signed)
[facility] AT [place]  Discharge Instructions: Thank you for choosing [facility] to provide your oncology and hematology care.  If you have a lab appointment with the [facility], please go directly to the [facility] and check in at the registration area.  Wear comfortable clothing and clothing appropriate for easy access to any Portacath or PICC line.   We strive to give you quality time with your provider. You may need to reschedule your appointment if you arrive late (15 or more minutes).  Arriving late affects you and other patients whose appointments are after yours.  Also, if you miss three or more appointments without notifying the office, you may be dismissed from the clinic at the provider's discretion.      For prescription refill requests, have your pharmacy contact our office and allow 72 hours for refills to be completed.    Today you received the following chemotherapy and/or immunotherapy agents GEMZAR and CISPLATIN      To help prevent nausea and vomiting after your treatment, we encourage you to take your nausea medication as directed.  BELOW ARE SYMPTOMS THAT SHOULD BE REPORTED IMMEDIATELY: *FEVER GREATER THAN 100.4 F (38 C) OR HIGHER *CHILLS OR SWEATING *NAUSEA AND VOMITING THAT IS NOT CONTROLLED WITH YOUR NAUSEA MEDICATION *UNUSUAL SHORTNESS OF BREATH *UNUSUAL BRUISING OR BLEEDING *URINARY PROBLEMS (pain or burning when urinating, or frequent urination) *BOWEL PROBLEMS (unusual diarrhea, constipation, pain near the anus) TENDERNESS IN MOUTH AND THROAT WITH OR WITHOUT PRESENCE OF ULCERS (sore throat, sores in mouth, or a toothache) UNUSUAL RASH, SWELLING OR PAIN  UNUSUAL VAGINAL DISCHARGE OR ITCHING   Items with * indicate a potential emergency and should be followed up as soon as possible or go to the Emergency Department if any problems should occur.  Please show the CHEMOTHERAPY ALERT CARD or IMMUNOTHERAPY ALERT CARD at  check-in to the Emergency Department and triage nurse.  Should you have questions after your visit or need to cancel or reschedule your appointment, please contact [facility] [place] AT [place]  [phone] and follow the prompts.  Office hours are 8:00 a.m. to 4:30 p.m. Monday - Friday. Please note that voicemails left after 4:00 p.m. may not be returned until the following business day.  We are closed weekends and major holidays. You have access to a nurse at all times for urgent questions. Please call the main number to the clinic [phone] and follow the prompts.  For any non-urgent questions, you may also contact your provider using MyChart. We now offer e-Visits for anyone 30 and older to request care online for non-urgent symptoms. For details visit [email or web address].   Also download the MyChart app! Go to the app store, search "MyChart", open the app, select [facility], and log in with your MyChart username and password.  Masks are optional in the cancer centers. If you would like for your care team to wear a mask while they are taking care of you, please let them know. For doctor visits, patients may have with them one support person who is at least 63 years old. At this time, visitors are not allowed in the infusion area. Gemcitabine Injection What is this medication? GEMCITABINE (jem SYE ta been) treats some types of cancer. It works by slowing down the growth of cancer cells. This medicine may be used for other purposes; ask your health care provider or pharmacist if you have questions. COMMON BRAND NAME(S): Gemzar, Infugem What should I tell my  care team before I take this medication? They need to know if you have any of these conditions: Blood disorders Infection Kidney disease Liver disease Lung or breathing disease, such as asthma or COPD Recent or ongoing radiation therapy An unusual or allergic reaction to gemcitabine, other medications, foods, dyes,  or preservatives If you or your partner are pregnant or trying to get pregnant Breast-feeding How should I use this medication? This medication is injected into a vein. It is given by your care team in a hospital or clinic setting. Talk to your care team about the use of this medication in children. Special care may be needed. Overdosage: If you think you have taken too much of this medicine contact a poison control center or emergency room at once. NOTE: This medicine is only for you. Do not share this medicine with others. What if I miss a dose? Keep appointments for follow-up doses. It is important not to miss your dose. Call your care team if you are unable to keep an appointment. What may interact with this medication? Interactions have not been studied. This list may not describe all possible interactions. Give your health care provider a list of all the medicines, herbs, non-prescription drugs, or dietary supplements you use. Also tell them if you smoke, drink alcohol, or use illegal drugs. Some items may interact with your medicine. What should I watch for while using this medication? Your condition will be monitored carefully while you are receiving this medication. This medication may make you feel generally unwell. This is not uncommon, as chemotherapy can affect healthy cells as well as cancer cells. Report any side effects. Continue your course of treatment even though you feel ill unless your care team tells you to stop. In some cases, you may be given additional medications to help with side effects. Follow all directions for their use. This medication may increase your risk of getting an infection. Call your care team for advice if you get a fever, chills, sore throat, or other symptoms of a cold or flu. Do not treat yourself. Try to avoid being around people who are sick. This medication may increase your risk to bruise or bleed. Call your care team if you notice any unusual  bleeding. Be careful brushing or flossing your teeth or using a toothpick because you may get an infection or bleed more easily. If you have any dental work done, tell your dentist you are receiving this medication. Avoid taking medications that contain aspirin, acetaminophen, ibuprofen, naproxen, or ketoprofen unless instructed by your care team. These medications may hide a fever. Talk to your care team if you or your partner wish to become pregnant or think you might be pregnant. This medication can cause serious birth defects if taken during pregnancy and for 6 months after the last dose. A negative pregnancy test is required before starting this medication. A reliable form of contraception is recommended while taking this medication and for 6 months after the last dose. Talk to your care team about effective forms of contraception. Do not father a child while taking this medication and for 3 months after the last dose. Use a condom while having sex during this time period. Do not breastfeed while taking this medication and for at least 1 week after the last dose. This medication may cause infertility. Talk to your care team if you are concerned about your fertility. What side effects may I notice from receiving this medication? Side effects that you should report  to your care team as soon as possible: Allergic reactions--skin rash, itching, hives, swelling of the face, lips, tongue, or throat Capillary leak syndrome--stomach or muscle pain, unusual weakness or fatigue, feeling faint or lightheaded, decrease in the amount of urine, swelling of the ankles, hands, or feet, trouble breathing Infection--fever, chills, cough, sore throat, wounds that don't heal, pain or trouble when passing urine, general feeling of discomfort or being unwell Liver injury--right upper belly pain, loss of appetite, nausea, light-colored stool, dark yellow or brown urine, yellowing skin or eyes, unusual weakness or  fatigue Low red blood cell level--unusual weakness or fatigue, dizziness, headache, trouble breathing Lung injury--shortness of breath or trouble breathing, cough, spitting up blood, chest pain, fever Stomach pain, bloody diarrhea, pale skin, unusual weakness or fatigue, decrease in the amount of urine, which may be signs of hemolytic uremic syndrome Sudden and severe headache, confusion, change in vision, seizures, which may be signs of posterior reversible encephalopathy syndrome (PRES) Unusual bruising or bleeding Side effects that usually do not require medical attention (report to your care team if they continue or are bothersome): Diarrhea Drowsiness Hair loss Nausea Pain, redness, or swelling with sores inside the mouth or throat Vomiting This list may not describe all possible side effects. Call your doctor for medical advice about side effects. You may report side effects to FDA at 1-800-FDA-1088. Where should I keep my medication? This medication is given in a hospital or clinic. It will not be stored at home. NOTE: This sheet is a summary. It may not cover all possible information. If you have questions about this medicine, talk to your doctor, pharmacist, or health care provider.  2023 Elsevier/Gold Standard (2007-12-06 00:00:00)  Cisplatin Injection What is this medication? CISPLATIN (SIS pla tin) treats some types of cancer. It works by slowing down the growth of cancer cells. This medicine may be used for other purposes; ask your health care provider or pharmacist if you have questions. COMMON BRAND NAME(S): Platinol, Platinol -AQ What should I tell my care team before I take this medication? They need to know if you have any of these conditions: Eye disease, vision problems Hearing problems Kidney disease Low blood counts, such as low white cells, platelets, or red blood cells Tingling of the fingers or toes, or other nerve disorder An unusual or allergic reaction to  cisplatin, carboplatin, oxaliplatin, other medications, foods, dyes, or preservatives If you or your partner are pregnant or trying to get pregnant Breast-feeding How should I use this medication? This medication is injected into a vein. It is given by your care team in a hospital or clinic setting. Talk to your care team about the use of this medication in children. Special care may be needed. Overdosage: If you think you have taken too much of this medicine contact a poison control center or emergency room at once. NOTE: This medicine is only for you. Do not share this medicine with others. What if I miss a dose? Keep appointments for follow-up doses. It is important not to miss your dose. Call your care team if you are unable to keep an appointment. What may interact with this medication? Do not take this medication with any of the following: Live virus vaccines This medication may also interact with the following: Certain antibiotics, such as amikacin, gentamicin, neomycin, polymyxin B, streptomycin, tobramycin, vancomycin Foscarnet This list may not describe all possible interactions. Give your health care provider a list of all the medicines, herbs, non-prescription drugs, or dietary  supplements you use. Also tell them if you smoke, drink alcohol, or use illegal drugs. Some items may interact with your medicine. What should I watch for while using this medication? Your condition will be monitored carefully while you are receiving this medication. You may need blood work done while taking this medication. This medication may make you feel generally unwell. This is not uncommon, as chemotherapy can affect healthy cells as well as cancer cells. Report any side effects. Continue your course of treatment even though you feel ill unless your care team tells you to stop. This medication may increase your risk of getting an infection. Call your care team for advice if you get a fever, chills, sore  throat, or other symptoms of a cold or flu. Do not treat yourself. Try to avoid being around people who are sick. Avoid taking medications that contain aspirin, acetaminophen, ibuprofen, naproxen, or ketoprofen unless instructed by your care team. These medications may hide a fever. This medication may increase your risk to bruise or bleed. Call your care team if you notice any unusual bleeding. Be careful brushing or flossing your teeth or using a toothpick because you may get an infection or bleed more easily. If you have any dental work done, tell your dentist you are receiving this medication. Drink fluids as directed while you are taking this medication. This will help protect your kidneys. Call your care team if you get diarrhea. Do not treat yourself. Talk to your care team if you or your partner wish to become pregnant or think you might be pregnant. This medication can cause serious birth defects if taken during pregnancy and for 14 months after the last dose. A negative pregnancy test is required before starting this medication. A reliable form of contraception is recommended while taking this medication and for 14 months after the last dose. Talk to your care team about effective forms of contraception. Do not father a child while taking this medication and for 11 months after the last dose. Use a condom during sex during this time period. Do not breast-feed while taking this medication. This medication may cause infertility. Talk to your care team if you are concerned about your fertility. What side effects may I notice from receiving this medication? Side effects that you should report to your care team as soon as possible: Allergic reactions--skin rash, itching, hives, swelling of the face, lips, tongue, or throat Eye pain, change in vision, vision loss Hearing loss, ringing in ears Infection--fever, chills, cough, sore throat, wounds that don't heal, pain or trouble when passing urine,  general feeling of discomfort or being unwell Kidney injury--decrease in the amount of urine, swelling of the ankles, hands, or feet Low red blood cell level--unusual weakness or fatigue, dizziness, headache, trouble breathing Painful swelling, warmth, or redness of the skin, blisters or sores at the infusion site Pain, tingling, or numbness in the hands or feet Unusual bruising or bleeding Side effects that usually do not require medical attention (report to your care team if they continue or are bothersome): Hair loss Nausea Vomiting This list may not describe all possible side effects. Call your doctor for medical advice about side effects. You may report side effects to FDA at 1-800-FDA-1088. Where should I keep my medication? This medication is given in a hospital or clinic. It will not be stored at home. NOTE: This sheet is a summary. It may not cover all possible information. If you have questions about this medicine, talk to  your doctor, pharmacist, or health care provider.  2023 Elsevier/Gold Standard (2022-02-09 00:00:00)

## 2022-10-19 MED ORDER — ENOXAPARIN SODIUM 120 MG/0.8ML IJ SOSY: 120.0000 mg | PREFILLED_SYRINGE | Freq: Two times a day (BID) | INTRAMUSCULAR | 0 refills | Status: DC

## 2022-10-24 ENCOUNTER — Encounter: Payer: Self-pay | Admitting: Family Medicine

## 2022-10-24 ENCOUNTER — Other Ambulatory Visit: Payer: Self-pay | Admitting: Oncology

## 2022-10-25 ENCOUNTER — Other Ambulatory Visit: Payer: Self-pay

## 2022-10-25 DIAGNOSIS — E119 Type 2 diabetes mellitus without complications: Secondary | ICD-10-CM

## 2022-10-25 MED ORDER — METFORMIN HCL 500 MG PO TABS: ORAL_TABLET | ORAL | 1 refills | Status: DC

## 2022-10-30 ENCOUNTER — Ambulatory Visit
Admission: RE | Admit: 2022-10-30 | Discharge: 2022-10-30 | Disposition: A | Discharge status: Home / Self Care | Payer: BC Managed Care – PPO | Source: Ambulatory Visit | Attending: Pulmonary Disease | Admitting: Pulmonary Disease

## 2022-10-30 DIAGNOSIS — J181 Lobar pneumonia, unspecified organism: Secondary | ICD-10-CM

## 2022-10-30 DIAGNOSIS — J189 Pneumonia, unspecified organism: Secondary | ICD-10-CM | POA: Diagnosis not present

## 2022-10-31 ENCOUNTER — Encounter: Payer: Self-pay | Admitting: Pulmonary Disease

## 2022-10-31 ENCOUNTER — Encounter: Payer: Self-pay | Admitting: Oncology

## 2022-10-31 ENCOUNTER — Inpatient Hospital Stay: Payer: BC Managed Care – PPO | Admitting: Oncology

## 2022-10-31 ENCOUNTER — Inpatient Hospital Stay: Payer: BC Managed Care – PPO | Attending: Oncology

## 2022-10-31 ENCOUNTER — Telehealth: Payer: Self-pay | Admitting: Pulmonary Disease

## 2022-10-31 VITALS — BP 172/86 | HR 101 | Temp 99.0°F | Wt 291.0 lb

## 2022-10-31 DIAGNOSIS — R918 Other nonspecific abnormal finding of lung field: Secondary | ICD-10-CM

## 2022-10-31 DIAGNOSIS — E875 Hyperkalemia: Secondary | ICD-10-CM | POA: Insufficient documentation

## 2022-10-31 DIAGNOSIS — Z86718 Personal history of other venous thrombosis and embolism: Secondary | ICD-10-CM | POA: Diagnosis not present

## 2022-10-31 DIAGNOSIS — J310 Chronic rhinitis: Secondary | ICD-10-CM | POA: Insufficient documentation

## 2022-10-31 DIAGNOSIS — Z5111 Encounter for antineoplastic chemotherapy: Secondary | ICD-10-CM | POA: Diagnosis not present

## 2022-10-31 DIAGNOSIS — D6861 Antiphospholipid syndrome: Secondary | ICD-10-CM | POA: Diagnosis not present

## 2022-10-31 DIAGNOSIS — Z7901 Long term (current) use of anticoagulants: Secondary | ICD-10-CM | POA: Insufficient documentation

## 2022-10-31 DIAGNOSIS — Z5112 Encounter for antineoplastic immunotherapy: Secondary | ICD-10-CM | POA: Insufficient documentation

## 2022-10-31 DIAGNOSIS — Z79899 Other long term (current) drug therapy: Secondary | ICD-10-CM | POA: Diagnosis not present

## 2022-10-31 DIAGNOSIS — C221 Intrahepatic bile duct carcinoma: Secondary | ICD-10-CM | POA: Insufficient documentation

## 2022-10-31 DIAGNOSIS — R9389 Abnormal findings on diagnostic imaging of other specified body structures: Secondary | ICD-10-CM

## 2022-10-31 LAB — COMPREHENSIVE METABOLIC PANEL
ALT: 33 U/L (ref 0–44)
AST: 22 U/L (ref 15–41)
Albumin: 3.2 g/dL — ABNORMAL LOW (ref 3.5–5.0)
Alkaline Phosphatase: 64 U/L (ref 38–126)
Anion gap: 10 (ref 5–15)
BUN: 8 mg/dL (ref 8–23)
CO2: 28 mmol/L (ref 22–32)
Calcium: 8.3 mg/dL — ABNORMAL LOW (ref 8.9–10.3)
Chloride: 99 mmol/L (ref 98–111)
Creatinine, Ser: 0.77 mg/dL (ref 0.61–1.24)
GFR, Estimated: >60 mL/min (ref >60)
Glucose, Bld: 217 mg/dL — ABNORMAL HIGH (ref 70–99)
Potassium: 4.4 mmol/L (ref 3.5–5.1)
Sodium: 137 mmol/L (ref 135–145)
Total Bilirubin: 0.5 mg/dL (ref 0.3–1.2)
Total Protein: 5.9 g/dL — ABNORMAL LOW (ref 6.5–8.1)

## 2022-10-31 LAB — CBC WITH DIFFERENTIAL/PLATELET
Abs Immature Granulocytes: 0.03 10*3/uL (ref 0.00–0.07)
Basophils Absolute: 0 10*3/uL (ref 0.0–0.1)
Basophils Relative: 0 %
Eosinophils Absolute: 0 10*3/uL (ref 0.0–0.5)
Eosinophils Relative: 0 %
HCT: 43.6 % (ref 39.0–52.0)
Hemoglobin: 14.9 g/dL (ref 13.0–17.0)
Immature Granulocytes: 1 %
Lymphocytes Relative: 8 %
Lymphs Abs: 0.4 10*3/uL — ABNORMAL LOW (ref 0.7–4.0)
MCH: 31.1 pg (ref 26.0–34.0)
MCHC: 34.2 g/dL (ref 30.0–36.0)
MCV: 91 fL (ref 80.0–100.0)
Monocytes Absolute: 0.7 10*3/uL (ref 0.1–1.0)
Monocytes Relative: 12 %
Neutro Abs: 4.2 10*3/uL (ref 1.7–7.7)
Neutrophils Relative %: 79 %
Platelets: 202 10*3/uL (ref 150–400)
RBC: 4.79 MIL/uL (ref 4.22–5.81)
RDW: 13 % (ref 11.5–15.5)
WBC: 5.3 10*3/uL (ref 4.0–10.5)
nRBC: 0 % (ref 0.0–0.2)

## 2022-10-31 LAB — MAGNESIUM: Magnesium: 2 mg/dL (ref 1.7–2.4)

## 2022-10-31 MED ORDER — AMOXICILLIN-POT CLAVULANATE 875-125 MG PO TABS: 1.0000 | ORAL_TABLET | Freq: Two times a day (BID) | ORAL | 0 refills | Status: AC

## 2022-10-31 MED FILL — Dexamethasone Sodium Phosphate Inj 100 MG/10ML: INTRAMUSCULAR | Qty: 1 | Status: AC

## 2022-10-31 MED FILL — Fosaprepitant Dimeglumine For IV Infusion 150 MG (Base Eq): INTRAVENOUS | Qty: 5 | Status: AC

## 2022-10-31 NOTE — Telephone Encounter (Signed)
Reviewed recent CT scan and discussed with oncologist.  Prior CT scan 09/2022 with mild improvement in infiltrates.  Now worse on CT scan 10/2022.  Waxing and waning.  Given persistent nature of infiltrates despite antibiotic therapy, additional information is needed.  The differential includes noninfectious pneumonia, ILD, possible sequela of underlying cancer.  Aspiration is considered, would explain some of the waxing and waning and persistence but my recollection he denies significant aspiration symptoms on evaluation.  Referral to thoracic surgery for surgical lung biopsy to aid in diagnosis.  This is critical to determining next therapeutic steps.

## 2022-10-31 NOTE — Telephone Encounter (Signed)
Mychart message sent by pt: Christopher Marsh "[name]"  P [facility] (supporting Lanier Clam, MD)8 minutes ago (11:12 AM)    I know you're busy and I'm not trying to play doctor. But, I saw the results of my CT Scan and was wondering should we move my appointment up from next week to earlier, or at least be on some antibiotic's?     Dr. Silas Flood, please advise.

## 2022-10-31 NOTE — Assessment & Plan Note (Addendum)
He follows up with pulmonology.  repeat CT images were reviewed and discussed with patient. I also discussed with pulmonology Dr. Silas Flood who recommends surgical biopsy. DDX includes organizing pneumonia, ILD, aspiration, lymphatic spread, etc.

## 2022-10-31 NOTE — Assessment & Plan Note (Addendum)
Imaging findings and pathology results were reviewed with patient.  Clinical diagnosis is cholangiocarcinoma, although atypical with both normal CEA and CA 19.9 Labs are reviewed and discussed with patient. S/p cycle 1 Gemcitabine, Cisplatin and Durvalumab.  Overall he tolerates very well.  Proceed with cycle 2 Cisplatin Gemcitabine Durvalumab

## 2022-10-31 NOTE — Addendum Note (Signed)
Addended by: Earlie Server on: 10/31/2022 08:22 PM   Modules accepted: Orders

## 2022-10-31 NOTE — Assessment & Plan Note (Signed)
-   Continue calcium supplementation. 

## 2022-10-31 NOTE — Assessment & Plan Note (Signed)
#  Recurrent lower extremity DVT, secondary to antiphospholipid syndrome.-.  Persistent lupus anticoagulant positive.Recommend life time anticoagulation Patient was previously on Coumadin now on Lovenox. continue Lovenox 1 mg/kg twice daily given the upcoming possible bronchoscopy by his pulmonologist, as well as possible cytopenia secondary to chemotherapy.

## 2022-10-31 NOTE — Progress Notes (Addendum)
Hematology/Oncology Progress note [phone] [phone]      ASSESSMENT & PLAN:   Cancer Staging  Cholangiocarcinoma [facility]) Staging form: Intrahepatic Bile Duct, AJCC 8th Edition - Clinical stage from 08/27/2022: Stage IV (cT2, cN1, cM1) - Signed by Earlie Server, MD on 09/13/2022   Cholangiocarcinoma [facility]) Imaging findings and pathology results were reviewed with patient.  Clinical diagnosis is cholangiocarcinoma, although atypical with both normal CEA and CA 19.9 Labs are reviewed and discussed with patient. S/p cycle 1 Gemcitabine, Cisplatin and Durvalumab.  Overall he tolerates very well.  Proceed with cycle 2 Cisplatin Gemcitabine Durvalumab   Lung infiltrate on CT He follows up with pulmonology.  repeat CT images were reviewed and discussed with patient. I also discussed with pulmonology Dr. Silas Marsh who recommends surgical biopsy. DDX includes organizing pneumonia, ILD, aspiration, lymphatic spread, etc.    Hypocalcemia Continue calcium supplementation.   Antiphospholipid syndrome (HCC) #Recurrent lower extremity DVT, secondary to antiphospholipid syndrome.-.  Persistent lupus anticoagulant positive.Recommend life time anticoagulation Patient was previously on Coumadin now on Lovenox. continue Lovenox 1 mg/kg twice daily given the upcoming possible bronchoscopy by his pulmonologist, as well as possible cytopenia secondary to chemotherapy.  Orders Placed This Encounter  Procedures   CBC with Differential    Standing Status:   Future    Standing Expiration Date:   12/14/2023   Comprehensive metabolic panel    Standing Status:   Future    Standing Expiration Date:   12/14/2023   Magnesium    Standing Status:   Future    Standing Expiration Date:   12/14/2023   CBC with Differential    Standing Status:   Future    Standing Expiration Date:   12/21/2023   Comprehensive metabolic panel    Standing Status:   Future    Standing Expiration Date:    12/21/2023   Magnesium    Standing Status:   Future    Standing Expiration Date:   12/21/2023    Follow up Per LOS  All questions were answered. The patient knows to call the clinic with any problems, questions or concerns.  Earlie Server, MD, PhD [facility] Hematology Oncology 10/31/2022   CHIEF COMPLAINTS/REASON FOR VISIT:  Follow up for right lower extremity DVT, antiphospholipid syndrome, metastatic cholangiocarcinoma. HISTORY OF PRESENTING ILLNESS:  Patient reports remote history of left lower extremity DVT in 2002.  He was initiated on Lovenox and bridged to Coumadin.  Patient took warfarin for 2 years before anticoagulation was stopped. 12/03/2021, patient presented to emergency room for evaluation of right lower extremity pain and swelling for about a week.  Started on the inner side of right thigh and migrated to the right calf. + Associated with swelling.  Denies any recent injury, hospitalization, surgery.  He first noticed the symptoms after playing basketball with his grandson.   12/03/2021, right lower extremity ultrasound showed occlusive DVT extending from the mid aspect of the femoral vein through the imaged tibial vein.  Age-indeterminate. Patient was started on Xarelto. He was referred to establish care with vascular surgeon and was seen by Christopher Marsh on 12/06/2021.  Shared decision was made not to proceed with embolectomy.  Continue anticoagulation. Patient was referred to hematology oncology for further evaluation.  Patient denies any family history of blood clots.  Denies any unintentional weight loss, fever or night sweats. 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  He sometimes stay in  his car while waiting for next assessment appointment.  He reports the right lower extremity symptom has improved since the start of Xarelto.  No active bleeding events.  #12/18/2021 hypercoagulable  work-up showed JAK2 V617F mutation negative, with reflex to other mutations CALR, MPL, JAK 2 Ex 12-15 mutations negative, negative anticardiolipin IgG and IgM antibodies, positive lupus anticoagulant, negative factor V Leiden mutation, negative prothrombin gene mutation, normal protein C activity, normal protein S antigen level. Patient was recommended to switch to Coumadin with INR goal of 2-3.  #03/15/2022, repeat lupus anticoagulant is persistently positive- + antiphospholipid syndrome.  Patient is currently on Lovenox 1 mg/kg twice daily.  admitted due to multifocal pneumonia, treated with Influenza panel negative, COVID-negative, HIV negative, Complete course of antibiotics with Zithromax, Vantin   + HCV antibody positive, HCV RNA quantification not detected. HCV RT PCR not detected.   INTERVAL HISTORY Christopher Marsh is a 62 y.o. male who has above history reviewed by me today presents for follow up visit for management of right lower extremity DVT and antiphospholipid syndrome, metastatic cholangiocarcinoma.  Oncology History  Cholangiocarcinoma ([place])  10/30/2021 Imaging   CT chest wo contrast 1. Heterogeneous bilateral ground-glass disease, multilobar but worst in the right middle lobe, lingula and lower lobes. There is progression of the abnormality since 10/12/2022. Though pneumonia could produce this appearance, given persistence and fluctuating appearance since October, consider alternative entities such as hypersensitivity pneumonitis or idiopathic interstitial pneumonia. 2. Slightly enlarged pulmonary trunk, possible arterial hypertension 3. Redemonstrated large hypodense right hepatic lobe mass.   08/18/2022 Imaging   CT chest w contrast showed 1. Multifocal airspace disease in both lungs, left side greater than right. Findings are most compatible with multifocal pneumonia. 2. Multiple new hepatic lesions with enlarging lymph nodes in the upper abdomen and chest. Findings are  concerning for metastatic disease. Based on the multifocal pneumonia, hepatic abscesses would also be in the differential diagnosis. Recommend further characterization of the abdomen and pelvis with CT with IV contrast. 3. Enlargement of the main pulmonary artery could be associated with pulmonary hypertension. 4. Coronary artery calcifications. 5. Multinodular goiter. Patient has known thyroid nodules and previous thyroid ultrasound.    08/18/2022 Imaging   CT abdomen pelvis w contrast  Interval development of 6.5 x 5.2 cm heterogeneously enhancing mass in right hepatic lobe. This is highly concerning for neoplasm or malignancy, and further evaluation with MRI is recommended.   Mildly enlarged periaortic adenopathy is noted, with the largest lymph node measuring 12 mm. Metastatic disease cannot be excluded. Mildly enlarged adenopathy is also noted in the gastrohepatic ligament, but this is unchanged compared to prior exam.   Stable bibasilar lung opacities are noted concerning for inflammation, atelectasis or possibly scarring.   Grossly stable multi-septated cystic lesion is seen involving upper pole of right kidney with peripheral calcifications compared to prior exam of 2019, most likely representing benign etiology.   Aortic Atherosclerosis    08/18/2022 - 08/21/2022 Hospital Admission   Admitted due to multifocal pneumonia, treated with Influenza panel negative, COVID-negative, HIV negative, Complete course of antibiotics with Zithromax, Vantin     08/19/2022 Imaging   MR abdomen w wo contrast  Marked caudate lobe hypertrophy, highly suspicious for hepatic cirrhosis.   Numerous hypervascular masses throughout the right hepatic lobe, highly suspicious for multifocal hepatocellular carcinoma. Recommend correlation with AFP and consider tissue sampling.   Mild abdominal lymphadenopathy, with differential diagnosis including metastatic disease and reactive lymphadenopathy in setting of  cirrhosis.  Bilateral lower lobe infiltrates, as better demonstrated on recent CT.    08/19/2022 Tumor Marker   AFP 4.1   08/27/2022 Initial Diagnosis   Cholangiocarcinoma  -08/20/22 Liver mass biopsy showed poorly differentiated carcinoma Immunohistochemical stains show that the poorly differentiated carcinoma is positive for CK7 and MOC-31, suggestive of a poorly differentiated adenocarcinoma.  Immunohistochemical stains for CK20, CDX2, HepPar 1 and arginase are negative.  Immunostain for glypican-3 shows very focal  likely nonspecific labeling.  This immunoprofile is nonspecific but can be compatible with a poorly differentiated primary cholangiocarcinoma in absence of any other lesions.   NGS showed BAP 1 mutation.  Case was presented at tumor board, I have also discussed with pathologist Dr.Rubinas,  IHC pattern is more consistent with adenocarcinoma, unlikely [place] [due to negative HepPar 1 and arginase], cholangiocarcinoma is favored, although this is a diagnosis of exclusion. Second opinion ar [facility]   08/27/2022 Cancer Staging   Staging form: Intrahepatic Bile Duct, AJCC 8th Edition - Clinical stage from 08/27/2022: Stage IV (cT2, cN1, cM1) - Signed by Earlie Server, MD on 09/13/2022 Stage prefix: Initial diagnosis   08/27/2022 Tumor Marker   CA 19.9  16 CEA 2.8   09/12/2022 Imaging   PET scan showed 1. Large right hepatic lobe mass is hypermetabolic and consistent with known cholangiocarcinoma. 2. Scattered borderline enlarged upper abdominal lymph nodes do not show any significant FDG uptake. 3. 4 hypermetabolic bone lesions consistent with metastatic disease. 4. Progressive diffuse airspace process in the left lung could be due to atypical pneumonia, pulmonary hemorrhage or left-sided aspiration. 5. Aortic atherosclerosis.  09/12/2022 I had a phone discussion with patient after his PET scan resulted.  Recommend systemic chemotherapy.  Patient would like to defer until his  second opinion visit at [facility].   09/15/2022 Imaging   CT angio chest pulmonary embolism protocol showed 1. No evidence for pulmonary embolism. 2. Progression of bilateral multifocal patchy ground-glass and interstitial opacities, left greater than right. Findings are concerning for multifocal pneumonia. 3. Air-fluid levels in the left lower lobe bronchi worrisome for aspiration. 4. Mediastinal and hilar lymphadenopathy has increased in size and number when compared to the prior study, likely reactive.5. Cardiomegaly. 6. Findings compatible with pulmonary artery hypertension.7. Stable right liver mass and complex right renal cystic lesion. 8. Stable enlargement of the left thyroid.   09/15/2022 - 09/17/2022 Hospital Admission   Patient was admitted due to shortness of breath, CT showed progression of bilateral multifocal patchy groundglass and interstitial opacities.  Left greater than right.  Concerning for multifocal pneumonia.  Patient was started on broad-spectrum antibiotics with significant improvement.  He was discharged on oral antibiotics.   09/19/2022 Miscellaneous   Patient went to [facility] for second opinion.  He was seen by Stanton Kidney, who felt that the presentation is consistent with metastatic cholangiocarcinoma, and agrees with the plan of cisplatin/gemcitabine/durvalumab..    10/11/2022 -  Chemotherapy   Patient is on Treatment Plan : Cisplatin D1,8 + Gemcitabine + Durvalumab  D1,8 q21d x     10/12/2022 Imaging   CT chest w contrast 1. Bilateral airspace and ground-glass opacities have mildly decreased in amount and density when compared to the prior study. Findings are compatible with resolving multifocal pneumonia. Continued follow-up imaging recommended. 2. Stable enlarged heterogeneous left thyroid gland with 3.2 cm nodule. This can be further evaluated with dedicated ultrasound. 3. Stable hepatic and right renal lesions, incompletely evaluated.   Aortic Atherosclerosis  (ICD10-I70.0).     He tolerated first cycle  of chemotherapy. + fatigue after treatment. He spent a few days with families at the beach. He has had a few drinks Today denies nausea vomiting diarrhea.  Appetite is slightly better, he has gained weight.  SOB has gotten slightly worse.   Review of Systems  Constitutional:  Positive for fatigue. Negative for appetite change, chills and fever.  HENT:   Negative for hearing loss and voice change.   Eyes:  Negative for eye problems.  Respiratory:  Positive for shortness of breath. Negative for chest tightness and cough.   Cardiovascular:  Negative for chest pain.  Gastrointestinal:  Negative for abdominal distention, abdominal pain and blood in stool.  Endocrine: Negative for hot flashes.  Genitourinary:  Negative for difficulty urinating and frequency.   Musculoskeletal:  Negative for arthralgias.  Skin:  Negative for itching and rash.  Neurological:  Negative for extremity weakness.  Hematological:  Negative for adenopathy. Bruises/bleeds easily.  Psychiatric/Behavioral:  Negative for confusion.     MEDICAL HISTORY:  Past Medical History:  Diagnosis Date   Antiphospholipid antibody syndrome (HCC)    Arthritis    Diabetes mellitus without complication (HCC)    DVT (deep venous thrombosis) (HCC)    Lt leg   Dyspnea    Elevated lipids    Erythrocytosis    Hyperlipidemia    Hypertension    Lower extremity edema    Multinodular thyroid    Post-thrombotic syndrome     SURGICAL HISTORY: Past Surgical History:  Procedure Laterality Date   COLONOSCOPY WITH PROPOFOL N/A 09/23/2018   Per Dr. Alice Reichert, polyp, repeat in 3 yrs    EYE MUSCLE SURGERY     JOINT REPLACEMENT Left 07/22/2017   TOTAL HIP ARTHROPLASTY Left 07/22/2017   Procedure: TOTAL HIP ARTHROPLASTY;  Surgeon: Dereck Leep, MD;  Location: [facility] ORS;  Service: Orthopedics;  Laterality: Left;    SOCIAL HISTORY: Social History   Socioeconomic History   Marital status:  Married    Spouse name: Not on file   Number of children: Not on file   Years of education: Not on file   Highest education level: Not on file  Occupational History   Not on file  Tobacco Use   Smoking status: Former    Packs/day: 0.50    Years: 6.00    Total pack years: 3.00    Types: Cigarettes    Quit date: 31    Years since quitting: 42.0   Smokeless tobacco: Never   Tobacco comments:    Pt states when he did smoke he smoked at most 1 ppd. ALS 09/26/2022  Vaping Use   Vaping Use: Never used  Substance and Sexual Activity   Alcohol use: Yes    Comment: 2-3 nights a week   Drug use: No   Sexual activity: Not on file  Other Topics Concern   Not on file  Social History Narrative   Not on file   Social Determinants of Health   Financial Resource Strain: Low Risk  (08/27/2022)   Overall Financial Resource Strain (CARDIA)    Difficulty of Paying Living Expenses: Not very hard  Food Insecurity: No Food Insecurity (09/15/2022)   Hunger Vital Sign    Worried About Running Out of Food in the Last Year: Never true    Ran Out of Food in the Last Year: Never true  Transportation Needs: No Transportation Needs (09/15/2022)   PRAPARE - Hydrologist (Medical): No    Lack of Transportation (  Non-Medical): No  Physical Activity: Insufficiently Active (08/27/2022)   Exercise Vital Sign    Days of Exercise per Week: 3 days    Minutes of Exercise per Session: 30 min  Stress: Stress Concern Present (08/27/2022)   [place]    Feeling of Stress : Rather much  Social Connections: Socially Integrated (08/27/2022)   Social Connection and Isolation Panel [NHANES]    Frequency of Communication with Friends and Family: More than three times a week    Frequency of Social Gatherings with Friends and Family: More than three times a week    Attends Religious Services: 1 to 4 times per year     Active Member of [employer] or Organizations: Yes    Attends Archivist Meetings: Never    Marital Status: Married  Human resources officer Violence: Not At Risk (09/15/2022)   Humiliation, Afraid, Rape, and Kick questionnaire    Fear of Current or Ex-Partner: No    Emotionally Abused: No    Physically Abused: No    Sexually Abused: No    FAMILY HISTORY: Family History  Problem Relation Age of Onset   Breast cancer Mother    Emphysema Father    Colon cancer Maternal Grandmother     ALLERGIES:  has No Known Allergies.  MEDICATIONS:  Current Outpatient Medications  Medication Sig Dispense Refill   albuterol (VENTOLIN HFA) 108 (90 Base) MCG/ACT inhaler Inhale 2 puffs into the lungs every 4 (four) hours as needed. 18 g 1   amLODipine (NORVASC) 10 MG tablet Take 1 tablet (10 mg total) by mouth daily. 30 tablet 0   amoxicillin-clavulanate (AUGMENTIN) 875-125 MG tablet Take 1 tablet by mouth 2 (two) times daily for 10 days. 20 tablet 0   Calcium Carb-Cholecalciferol (CALCIUM 500 + D PO) Take 1 tablet by mouth daily.     dexamethasone (DECADRON) 4 MG tablet Take 2 tablets daily x 3 days starting the day after cisplatin chemotherapy. Take with food. 30 tablet 1   enoxaparin (LOVENOX) 120 MG/0.8ML injection Inject 0.8 mLs (120 mg total) into the skin every 12 (twelve) hours for 28 days. 44.8 mL 0   glipiZIDE (GLUCOTROL) 10 MG tablet Take 1 tablet (10 mg total) by mouth 2 (two) times daily before a meal. (Patient taking differently: Take 10 mg by mouth daily before breakfast.) 180 tablet 3   glucose blood test strip 1 each by Other route as needed for other. accu chek aviva plusUse as instructed     LORazepam (ATIVAN) 0.5 MG tablet Take 1 tablet (0.5 mg total) by mouth every 8 (eight) hours as needed for anxiety (nausea vomiting). 60 tablet 0   magnesium oxide (MAG-OX) 400 (240 Mg) MG tablet Take 400 mg by mouth in the morning.     metFORMIN (GLUCOPHAGE) 500 MG tablet TAKE TWO TABLETS BY MOUTH  EVERY MORNING WITH BREAKFAST 180 tablet 1   MUCINEX D MAX STRENGTH [phone] MG TB12 Take 1,200 mg by mouth 2 (two) times daily as needed (for congestion or to loosen mucous in the chest).     ondansetron (ZOFRAN) 8 MG tablet Take 1 tablet (8 mg total) by mouth every 8 (eight) hours as needed for nausea or vomiting. Start on the third day after cisplatin. 30 tablet 1   pravastatin (PRAVACHOL) 20 MG tablet Take 1 tablet (20 mg total) by mouth daily. 90 tablet 3   prochlorperazine (COMPAZINE) 10 MG tablet Take 1 tablet (10 mg total) by mouth  every 6 (six) hours as needed (Nausea or vomiting). 30 tablet 1   tadalafil (CIALIS) 20 MG tablet Take 0.5-1 tablets (10-20 mg total) by mouth every other day as needed for erectile dysfunction. 20 tablet 11   triamcinolone cream (KENALOG) 0.1 % Apply 1 Application topically 3 (three) times daily. (Patient taking differently: Apply 1 Application topically 3 (three) times daily as needed (for bilateral, below-the-knees skin irritation).) 453.6 g 1   benzonatate (TESSALON) 200 MG capsule Take 1 capsule (200 mg total) by mouth 3 (three) times daily as needed for cough. (Patient not taking: Reported on 10/31/2022) 30 capsule 3   No current facility-administered medications for this visit.     PHYSICAL EXAMINATION: ECOG PERFORMANCE STATUS: 1 - Symptomatic but completely ambulatory Vitals:   10/31/22 1321  BP: (!) 172/86  Pulse: (!) 101  Temp: 99 F (37.2 C)  SpO2: 94%   Filed Weights   10/31/22 1321  Weight: 291 lb (132 kg)    Physical Exam Constitutional:      General: He is not in acute distress. HENT:     Head: Normocephalic and atraumatic.  Eyes:     General: No scleral icterus. Cardiovascular:     Rate and Rhythm: Normal rate.  Pulmonary:     Effort: Pulmonary effort is normal. No respiratory distress.     Breath sounds: No wheezing.  Abdominal:     General: Bowel sounds are normal. There is no distension.  Musculoskeletal:        General:  Normal range of motion.     Cervical back: Normal range of motion and neck supple.     Right lower leg: No edema.     Comments: Bilateral chronic lower extremity swelling, varicose veins  Skin:    General: Skin is warm and dry.     Findings: No erythema or rash.  Neurological:     Mental Status: He is alert and oriented to person, place, and time. Mental status is at baseline.     Cranial Nerves: No cranial nerve deficit.  Psychiatric:        Mood and Affect: Mood normal.     LABORATORY DATA:  I have reviewed the data as listed    Latest Ref Rng & Units 10/31/2022    1:00 PM 10/16/2022    9:04 AM 10/10/2022    1:38 PM  CBC  WBC 4.0 - 10.5 K/uL 5.3  5.4  5.1   Hemoglobin 13.0 - 17.0 g/dL 14.9  16.2  16.3   Hematocrit 39.0 - 52.0 % 43.6  47.7  48.0   Platelets 150 - 400 K/uL 202  184  188       Latest Ref Rng & Units 10/31/2022    1:00 PM 10/16/2022    9:04 AM 10/10/2022    1:38 PM  CMP  Glucose 70 - 99 mg/dL 217  76  133   BUN 8 - 23 mg/dL '8  11  6   '$ Creatinine 0.61 - 1.24 mg/dL 0.77  0.73  0.68   Sodium 135 - 145 mmol/L 137  138  138   Potassium 3.5 - 5.1 mmol/L 4.4  4.0  3.8   Chloride 98 - 111 mmol/L 99  103  103   CO2 22 - 32 mmol/L '28  29  26   '$ Calcium 8.9 - 10.3 mg/dL 8.3  8.7  8.2   Total Protein 6.5 - 8.1 g/dL 5.9  5.9  6.2   Total Bilirubin 0.3 -  1.2 mg/dL 0.5  0.5  0.5   Alkaline Phos 38 - 126 U/L 64  56  51   AST 15 - 41 U/L '22  22  26   '$ ALT 0 - 44 U/L 33  82  21     RADIOGRAPHIC STUDIES: I have personally reviewed the radiological images as listed and agreed with the findings in the report. CT Chest Wo Contrast  Result Date: 10/30/2022 CLINICAL DATA:  Follow-up pneumonia EXAM: CT CHEST WITHOUT CONTRAST TECHNIQUE: Multidetector CT imaging of the chest was performed following the standard protocol without IV contrast. RADIATION DOSE REDUCTION: This exam was performed according to the departmental dose-optimization program which includes automated exposure  control, adjustment of the mA and/or kV according to patient size and/or use of iterative reconstruction technique. COMPARISON:  CT 10/12/2022, 09/15/2022, PET CT 09/12/2022, chest CT 08/16/2022 FINDINGS: Cardiovascular: Limited evaluation without intravenous contrast. Mild aortic atherosclerosis. No aneurysm. Enlarged pulmonary trunk up to 3.9 cm. Coronary vascular calcification. Normal cardiac size. No pericardial effusion Mediastinum/Nodes: Midline trachea. Enlarged heterogeneous left lobe of thyroid, This has been evaluated on previous imaging. (ref: J Am Coll Radiol. 2015 Feb;12(2): 143-50). Subcentimeter mediastinal lymph nodes. Esophagus within normal limits. Lungs/Pleura: Bilateral heterogeneous ground-glass densities, greatest within the right middle lobe and bilateral lower lobes appears slightly worse as compared with CT from 12/15. No pleural effusion or pneumothorax. Upper Abdomen: Hypodense liver mass. Complex cystic lesion upper pole right kidney with calcification, this was previously evaluated with MRI, no specific imaging follow-up is recommended. Musculoskeletal: No acute osseous abnormality IMPRESSION: 1. Heterogeneous bilateral ground-glass disease, multilobar but worst in the right middle lobe, lingula and lower lobes. There is progression of the abnormality since 10/12/2022. Though pneumonia could produce this appearance, given persistence and fluctuating appearance since October, consider alternative entities such as hypersensitivity pneumonitis or idiopathic interstitial pneumonia. 2. Slightly enlarged pulmonary trunk, possible arterial hypertension 3. Redemonstrated large hypodense right hepatic lobe mass. Aortic Atherosclerosis (ICD10-I70.0). Electronically Signed   By: Donavan Foil M.D.   On: 10/30/2022 21:49   CT Chest Wo Contrast  Result Date: 10/14/2022 CLINICAL DATA:  Follow-up left-sided pneumonia. Recent diagnosis of cholangiocarcinoma. EXAM: CT CHEST WITHOUT CONTRAST  TECHNIQUE: Multidetector CT imaging of the chest was performed following the standard protocol without IV contrast. RADIATION DOSE REDUCTION: This exam was performed according to the departmental dose-optimization program which includes automated exposure control, adjustment of the mA and/or kV according to patient size and/or use of iterative reconstruction technique. COMPARISON:  CT angiogram chest 09/15/2022 FINDINGS: Cardiovascular: No significant vascular findings. Normal heart size. No pericardial effusion. There are atherosclerotic calcifications of the aorta and coronary arteries. Mediastinum/Nodes: The left thyroid gland is heterogeneous and enlarged similar to the prior study. 3.2 cm rounded lesion seen in the left thyroid lobe image 7/25 which is similar to prior. There are no enlarged mediastinal or hilar lymph nodes identified. Visualized esophagus is within normal limits. Lungs/Pleura: There are patchy airspace and ground-glass opacities in the bilateral lower lobes, right middle lobe and left upper lobe. These have decreased in density and decreased in amount when compared to the prior study. Air-fluid levels are no longer identified within the left-sided bronchi. There is no pleural effusion or pneumothorax identified. Trachea and central airways are patent. Upper Abdomen: Hepatic lesions are not well defined on this study measuring up 5.6 cm and grossly unchanged. Complex cystic lesion in the right kidney is also partially imaged and grossly unchanged. Musculoskeletal: No chest wall mass or suspicious bone  lesions identified. IMPRESSION: 1. Bilateral airspace and ground-glass opacities have mildly decreased in amount and density when compared to the prior study. Findings are compatible with resolving multifocal pneumonia. Continued follow-up imaging recommended. 2. Stable enlarged heterogeneous left thyroid gland with 3.2 cm nodule. This can be further evaluated with dedicated ultrasound. 3. Stable  hepatic and right renal lesions, incompletely evaluated. Aortic Atherosclerosis (ICD10-I70.0). Electronically Signed   By: Ronney Asters M.D.   On: 10/14/2022 17:46

## 2022-11-01 ENCOUNTER — Other Ambulatory Visit: Payer: Self-pay

## 2022-11-01 ENCOUNTER — Encounter: Payer: Self-pay | Admitting: Oncology

## 2022-11-01 ENCOUNTER — Inpatient Hospital Stay: Payer: BC Managed Care – PPO

## 2022-11-01 VITALS — BP 138/67 | HR 91 | Temp 98.7°F | Resp 18

## 2022-11-01 DIAGNOSIS — J310 Chronic rhinitis: Secondary | ICD-10-CM | POA: Diagnosis not present

## 2022-11-01 DIAGNOSIS — C221 Intrahepatic bile duct carcinoma: Secondary | ICD-10-CM

## 2022-11-01 DIAGNOSIS — E875 Hyperkalemia: Secondary | ICD-10-CM | POA: Diagnosis not present

## 2022-11-01 DIAGNOSIS — R918 Other nonspecific abnormal finding of lung field: Secondary | ICD-10-CM | POA: Diagnosis not present

## 2022-11-01 DIAGNOSIS — Z86718 Personal history of other venous thrombosis and embolism: Secondary | ICD-10-CM | POA: Diagnosis not present

## 2022-11-01 DIAGNOSIS — Z7901 Long term (current) use of anticoagulants: Secondary | ICD-10-CM | POA: Diagnosis not present

## 2022-11-01 DIAGNOSIS — Z5111 Encounter for antineoplastic chemotherapy: Secondary | ICD-10-CM | POA: Diagnosis not present

## 2022-11-01 DIAGNOSIS — Z79899 Other long term (current) drug therapy: Secondary | ICD-10-CM | POA: Diagnosis not present

## 2022-11-01 DIAGNOSIS — Z5112 Encounter for antineoplastic immunotherapy: Secondary | ICD-10-CM | POA: Diagnosis not present

## 2022-11-01 DIAGNOSIS — D6861 Antiphospholipid syndrome: Secondary | ICD-10-CM | POA: Diagnosis not present

## 2022-11-01 MED ORDER — SODIUM CHLORIDE 0.9 % IV SOLN
10.0000 mg | Freq: Once | INTRAVENOUS | Status: AC
  Administered 2022-11-01: 10 mg via INTRAVENOUS
  Filled 2022-11-01: qty 10

## 2022-11-01 MED ORDER — PALONOSETRON HCL INJECTION 0.25 MG/5ML: 0.2500 mg | Freq: Once | INTRAVENOUS | Status: DC

## 2022-11-01 MED ORDER — MAGNESIUM SULFATE 2 GM/50ML IV SOLN
2.0000 g | Freq: Once | INTRAVENOUS | Status: AC
  Administered 2022-11-01: 2 g via INTRAVENOUS
  Filled 2022-11-01: qty 50

## 2022-11-01 MED ORDER — SODIUM CHLORIDE 0.9 % IV SOLN
150.0000 mg | Freq: Once | INTRAVENOUS | Status: AC
  Administered 2022-11-01: 150 mg via INTRAVENOUS
  Filled 2022-11-01: qty 150

## 2022-11-01 MED ORDER — SODIUM CHLORIDE 0.9 % IV SOLN
1000.0000 mg/m2 | Freq: Once | INTRAVENOUS | Status: AC
  Administered 2022-11-01: 2662 mg via INTRAVENOUS
  Filled 2022-11-01: qty 70.01

## 2022-11-01 MED ORDER — POTASSIUM CHLORIDE IN NACL 20-0.9 MEQ/L-% IV SOLN
Freq: Once | INTRAVENOUS | Status: AC
  Filled 2022-11-01: qty 1000

## 2022-11-01 MED ORDER — SODIUM CHLORIDE 0.9 % IV SOLN
Freq: Once | INTRAVENOUS | Status: AC
  Filled 2022-11-01: qty 250

## 2022-11-01 MED ORDER — SODIUM CHLORIDE 0.9 % IV SOLN
25.0000 mg/m2 | Freq: Once | INTRAVENOUS | Status: AC
  Administered 2022-11-01: 66 mg via INTRAVENOUS
  Filled 2022-11-01: qty 66

## 2022-11-01 MED ORDER — SODIUM CHLORIDE 0.9 % IV SOLN
10.0000 mg/kg | Freq: Once | INTRAVENOUS | Status: AC
  Administered 2022-11-01: 1360 mg via INTRAVENOUS
  Filled 2022-11-01: qty 20

## 2022-11-01 NOTE — Addendum Note (Signed)
Addended by: Earlie Server on: 11/01/2022 08:34 AM   Modules accepted: Orders

## 2022-11-01 NOTE — Progress Notes (Signed)
Pt generally has swelling to his BLE. The left leg is more swollen than the right. During his treatment his legs beginning to swell even more because they were down. RN educated the pt to keep them elevated to decrease the swelling. RN will notify the MD.

## 2022-11-01 NOTE — Progress Notes (Signed)
MD okay for fluids to run with treatment Cisplatin.

## 2022-11-07 MED FILL — Dexamethasone Sodium Phosphate Inj 100 MG/10ML: INTRAMUSCULAR | Qty: 1 | Status: AC

## 2022-11-07 MED FILL — Fosaprepitant Dimeglumine For IV Infusion 150 MG (Base Eq): INTRAVENOUS | Qty: 5 | Status: AC

## 2022-11-08 ENCOUNTER — Inpatient Hospital Stay: Payer: BC Managed Care – PPO

## 2022-11-08 ENCOUNTER — Other Ambulatory Visit: Payer: Self-pay | Admitting: Oncology

## 2022-11-08 VITALS — BP 157/69 | HR 75 | Temp 97.0°F | Resp 18 | Wt 284.2 lb

## 2022-11-08 DIAGNOSIS — C221 Intrahepatic bile duct carcinoma: Secondary | ICD-10-CM

## 2022-11-08 DIAGNOSIS — Z5112 Encounter for antineoplastic immunotherapy: Secondary | ICD-10-CM | POA: Diagnosis not present

## 2022-11-08 DIAGNOSIS — Z79899 Other long term (current) drug therapy: Secondary | ICD-10-CM | POA: Diagnosis not present

## 2022-11-08 DIAGNOSIS — J310 Chronic rhinitis: Secondary | ICD-10-CM | POA: Diagnosis not present

## 2022-11-08 DIAGNOSIS — E875 Hyperkalemia: Secondary | ICD-10-CM | POA: Diagnosis not present

## 2022-11-08 DIAGNOSIS — Z86718 Personal history of other venous thrombosis and embolism: Secondary | ICD-10-CM | POA: Diagnosis not present

## 2022-11-08 DIAGNOSIS — Z5111 Encounter for antineoplastic chemotherapy: Secondary | ICD-10-CM | POA: Diagnosis not present

## 2022-11-08 DIAGNOSIS — D6861 Antiphospholipid syndrome: Secondary | ICD-10-CM | POA: Diagnosis not present

## 2022-11-08 DIAGNOSIS — R918 Other nonspecific abnormal finding of lung field: Secondary | ICD-10-CM | POA: Diagnosis not present

## 2022-11-08 DIAGNOSIS — Z7901 Long term (current) use of anticoagulants: Secondary | ICD-10-CM | POA: Diagnosis not present

## 2022-11-08 LAB — COMPREHENSIVE METABOLIC PANEL
ALT: 38 U/L (ref 0–44)
AST: 26 U/L (ref 15–41)
Albumin: 3.5 g/dL (ref 3.5–5.0)
Alkaline Phosphatase: 68 U/L (ref 38–126)
Anion gap: 9 (ref 5–15)
BUN: 9 mg/dL (ref 8–23)
CO2: 29 mmol/L (ref 22–32)
Calcium: 9.1 mg/dL (ref 8.9–10.3)
Chloride: 97 mmol/L — ABNORMAL LOW (ref 98–111)
Creatinine, Ser: 0.79 mg/dL (ref 0.61–1.24)
GFR, Estimated: >60 mL/min (ref >60)
Glucose, Bld: 114 mg/dL — ABNORMAL HIGH (ref 70–99)
Potassium: 4.7 mmol/L (ref 3.5–5.1)
Sodium: 135 mmol/L (ref 135–145)
Total Bilirubin: 0.3 mg/dL (ref 0.3–1.2)
Total Protein: 6 g/dL — ABNORMAL LOW (ref 6.5–8.1)

## 2022-11-08 LAB — CBC WITH DIFFERENTIAL/PLATELET
Abs Immature Granulocytes: 0.39 10*3/uL — ABNORMAL HIGH (ref 0.00–0.07)
Basophils Absolute: 0.1 10*3/uL (ref 0.0–0.1)
Basophils Relative: 1 %
Eosinophils Absolute: 0 10*3/uL (ref 0.0–0.5)
Eosinophils Relative: 1 %
HCT: 45.3 % (ref 39.0–52.0)
Hemoglobin: 15.4 g/dL (ref 13.0–17.0)
Immature Granulocytes: 8 %
Lymphocytes Relative: 11 %
Lymphs Abs: 0.6 10*3/uL — ABNORMAL LOW (ref 0.7–4.0)
MCH: 31 pg (ref 26.0–34.0)
MCHC: 34 g/dL (ref 30.0–36.0)
MCV: 91.1 fL (ref 80.0–100.0)
Monocytes Absolute: 0.5 10*3/uL (ref 0.1–1.0)
Monocytes Relative: 11 %
Neutro Abs: 3.3 10*3/uL (ref 1.7–7.7)
Neutrophils Relative %: 68 %
Platelets: 299 10*3/uL (ref 150–400)
RBC: 4.97 MIL/uL (ref 4.22–5.81)
RDW: 12.6 % (ref 11.5–15.5)
Smear Review: NORMAL
WBC: 4.9 10*3/uL (ref 4.0–10.5)
nRBC: 0 % (ref 0.0–0.2)

## 2022-11-08 LAB — MAGNESIUM: Magnesium: 1.8 mg/dL (ref 1.7–2.4)

## 2022-11-08 MED ORDER — SODIUM CHLORIDE 0.9 % IV SOLN
1000.0000 mg/m2 | Freq: Once | INTRAVENOUS | Status: AC
  Administered 2022-11-08: 2662 mg via INTRAVENOUS
  Filled 2022-11-08: qty 52.6

## 2022-11-08 MED ORDER — SODIUM CHLORIDE 0.9 % IV SOLN
Freq: Once | INTRAVENOUS | Status: AC
  Filled 2022-11-08: qty 250

## 2022-11-08 MED ORDER — SODIUM CHLORIDE 0.9 % IV SOLN
Freq: Once | INTRAVENOUS | Status: DC
  Filled 2022-11-08: qty 250

## 2022-11-08 MED ORDER — PALONOSETRON HCL INJECTION 0.25 MG/5ML
0.2500 mg | Freq: Once | INTRAVENOUS | Status: AC
  Administered 2022-11-08: 0.25 mg via INTRAVENOUS
  Filled 2022-11-08: qty 5

## 2022-11-08 MED ORDER — POTASSIUM CHLORIDE IN NACL 20-0.9 MEQ/L-% IV SOLN
Freq: Once | INTRAVENOUS | Status: AC
  Filled 2022-11-08: qty 1000

## 2022-11-08 MED ORDER — SODIUM CHLORIDE 0.9 % IV SOLN
25.0000 mg/m2 | Freq: Once | INTRAVENOUS | Status: AC
  Administered 2022-11-08: 66 mg via INTRAVENOUS
  Filled 2022-11-08: qty 66

## 2022-11-08 MED ORDER — MAGNESIUM SULFATE 2 GM/50ML IV SOLN
2.0000 g | Freq: Once | INTRAVENOUS | Status: AC
  Administered 2022-11-08: 2 g via INTRAVENOUS
  Filled 2022-11-08: qty 50

## 2022-11-08 MED ORDER — SODIUM CHLORIDE 0.9 % IV SOLN
150.0000 mg | Freq: Once | INTRAVENOUS | Status: AC
  Administered 2022-11-08: 150 mg via INTRAVENOUS
  Filled 2022-11-08: qty 150

## 2022-11-08 MED ORDER — SODIUM CHLORIDE 0.9 % IV SOLN
10.0000 mg | Freq: Once | INTRAVENOUS | Status: AC
  Administered 2022-11-08: 10 mg via INTRAVENOUS
  Filled 2022-11-08: qty 10

## 2022-11-08 NOTE — Progress Notes (Signed)
0900: Weight decreased from 291 lbs on 10/31/22 to 284 lbs 2.8 oz today.  Pt states he is eating and drinking okay, but "the holidays are over" so there has been a change in diet. Pt states that he does not crave meat so getting protein in other ways such as eggs and beans. total protein level today was 6.0. Per Dr. Tasia Catchings pt to be referred to nutritionist, MD team will place the referral. Pt aware and agrees with plan.

## 2022-11-08 NOTE — Patient Instructions (Signed)
[facility] [place]  Discharge Instructions: Thank you for choosing [facility] to provide your oncology and hematology care.  If you have a lab appointment with the [facility], please go directly to the [place] and check in at the registration area.  Wear comfortable clothing and clothing appropriate for easy access to any Portacath or PICC line.   We strive to give you quality time with your provider. You may need to reschedule your appointment if you arrive late (15 or more minutes).  Arriving late affects you and other patients whose appointments are after yours.  Also, if you miss three or more appointments without notifying the office, you may be dismissed from the clinic at the provider's discretion.      For prescription refill requests, have your pharmacy contact our office and allow 72 hours for refills to be completed.    Today you received the following chemotherapy and/or immunotherapy agents Gemzar and Cisplatin        To help prevent nausea and vomiting after your treatment, we encourage you to take your nausea medication as directed.  BELOW ARE SYMPTOMS THAT SHOULD BE REPORTED IMMEDIATELY: *FEVER GREATER THAN 100.4 F (38 C) OR HIGHER *CHILLS OR SWEATING *NAUSEA AND VOMITING THAT IS NOT CONTROLLED WITH YOUR NAUSEA MEDICATION *UNUSUAL SHORTNESS OF BREATH *UNUSUAL BRUISING OR BLEEDING *URINARY PROBLEMS (pain or burning when urinating, or frequent urination) *BOWEL PROBLEMS (unusual diarrhea, constipation, pain near the anus) TENDERNESS IN MOUTH AND THROAT WITH OR WITHOUT PRESENCE OF ULCERS (sore throat, sores in mouth, or a toothache) UNUSUAL RASH, SWELLING OR PAIN  UNUSUAL VAGINAL DISCHARGE OR ITCHING   Items with * indicate a potential emergency and should be followed up as soon as possible or go to the Emergency Department if any problems should occur.  Please show the CHEMOTHERAPY ALERT CARD or IMMUNOTHERAPY ALERT CARD at  check-in to the Emergency Department and triage nurse.  Should you have questions after your visit or need to cancel or reschedule your appointment, please contact [facility] [place]  [phone] and follow the prompts.  Office hours are 8:00 a.m. to 4:30 p.m. Monday - Friday. Please note that voicemails left after 4:00 p.m. may not be returned until the following business day.  We are closed weekends and major holidays. You have access to a nurse at all times for urgent questions. Please call the main number to the clinic [phone] and follow the prompts.  For any non-urgent questions, you may also contact your provider using MyChart. We now offer e-Visits for anyone 36 and older to request care online for non-urgent symptoms. For details visit [email or web address].   Also download the MyChart app! Go to the app store, search "MyChart", open the app, select [facility], and log in with your MyChart username and password.

## 2022-11-12 ENCOUNTER — Encounter: Payer: Self-pay | Admitting: *Deleted

## 2022-11-12 ENCOUNTER — Institutional Professional Consult (permissible substitution) (INDEPENDENT_AMBULATORY_CARE_PROVIDER_SITE_OTHER): Payer: BC Managed Care – PPO | Admitting: Thoracic Surgery (Cardiothoracic Vascular Surgery)

## 2022-11-12 ENCOUNTER — Other Ambulatory Visit: Payer: Self-pay | Admitting: *Deleted

## 2022-11-12 ENCOUNTER — Encounter: Payer: Self-pay | Admitting: Thoracic Surgery (Cardiothoracic Vascular Surgery)

## 2022-11-12 VITALS — BP 149/75 | HR 85 | Resp 20 | Ht 75.0 in | Wt 284.0 lb

## 2022-11-12 DIAGNOSIS — J849 Interstitial pulmonary disease, unspecified: Secondary | ICD-10-CM | POA: Diagnosis not present

## 2022-11-12 NOTE — Progress Notes (Signed)
PCP is Laurey Morale, MD Referring Provider is Hunsucker, Bonna Gains, MD  Chief Complaint  Patient presents with   Interstitial Lung Disease    Surgical consult, Chest CT 10/12/22 and 10/30/22    HPI: Christopher Marsh is sent for consultation regarding possible lung biopsy.  Christopher Marsh is a 62 year old man with recurrent pneumonias, groundglass opacities in both lungs, cholangiocarcinoma, hypertension, hyperlipidemia, type 2 diabetes without complication, antiphospholipid antibody, and DVT on lifelong anticoagulation.  He said issues with recurrent pneumonias throughout his life.  He had first episode about 40 years ago when he was 62 years old.  In 2000 and a severe pneumonia was in the hospital for 20 days.  No further respiratory issues until he had COVID a few years ago.  Since then he has had recurrent pneumonias every year usually in the late fall early December timeframe.  He was being evaluated for his recurrent pneumonia and had a CT scan which showed multifocal groundglass opacities/infiltrates and also showed a liver mass.  Biopsy subsequently showed cholangiocarcinoma.  He was referred to Dr. Tasia Catchings for treatment of the cholangiocarcinoma and Dr. Silas Flood for assessment of the pulmonary infiltrates.  He was treated with antibiotics and had some improvement but then a repeat CT showed some mild progression.  It is unclear if this is infectious, inflammatory, autoimmune, or neoplastic in nature.  He now is referred for a lung biopsy for diagnostic purposes.  He is not having any chest pain, pressure, or tightness.  Does get short of breath with heavy exertion but nothing out of the ordinary.  Diagnosed with DVT about a year ago.  Currently on Lovenox twice daily for that.  Has lost about 28 pounds in the last 6 months and 17 pounds in the last 3 months.  Currently is being treated with cisplatin, gemcitabine, and durvalumab.  Zubrod Score: At the time of surgery this patient's most  appropriate activity status/level should be described as: '[]'$     0    Normal activity, no symptoms '[x]'$     1    Restricted in physical strenuous activity but ambulatory, able to do out light work '[]'$     2    Ambulatory and capable of self care, unable to do work activities, up and about >50 % of waking hours                              '[]'$     3    Only limited self care, in bed greater than 50% of waking hours '[]'$     4    Completely disabled, no self care, confined to bed or chair '[]'$     5    Moribund  Past Medical History:  Diagnosis Date   Antiphospholipid antibody syndrome ([place])    Arthritis    Diabetes mellitus without complication ([place])    DVT (deep venous thrombosis) ([place])    Lt leg   Dyspnea    Elevated lipids    Erythrocytosis    Hyperlipidemia    Hypertension    Lower extremity edema    Multinodular thyroid    Post-thrombotic syndrome     Past Surgical History:  Procedure Laterality Date   COLONOSCOPY WITH PROPOFOL N/A 09/23/2018   Per Dr. Alice Reichert, polyp, repeat in 3 yrs    EYE MUSCLE SURGERY     JOINT REPLACEMENT Left 07/22/2017   TOTAL HIP ARTHROPLASTY Left 07/22/2017   Procedure: TOTAL HIP  ARTHROPLASTY;  Surgeon: Dereck Leep, MD;  Location: [facility] ORS;  Service: Orthopedics;  Laterality: Left;    Family History  Problem Relation Age of Onset   Breast cancer Mother    Emphysema Father    Colon cancer Maternal Grandmother     Social History Social History   Tobacco Use   Smoking status: Former    Packs/day: 0.50    Years: 6.00    Total pack years: 3.00    Types: Cigarettes    Quit date: 1982    Years since quitting: 42.0   Smokeless tobacco: Never   Tobacco comments:    Pt states when he did smoke he smoked at most 1 ppd. ALS 09/26/2022  Vaping Use   Vaping Use: Never used  Substance Use Topics   Alcohol use: Yes    Comment: 2-3 nights a week   Drug use: No    Current Outpatient Medications  Medication Sig Dispense Refill   albuterol (VENTOLIN  HFA) 108 (90 Base) MCG/ACT inhaler Inhale 2 puffs into the lungs every 4 (four) hours as needed. 18 g 1   amLODipine (NORVASC) 10 MG tablet Take 1 tablet (10 mg total) by mouth daily. 30 tablet 0   benzonatate (TESSALON) 200 MG capsule Take 1 capsule (200 mg total) by mouth 3 (three) times daily as needed for cough. 30 capsule 3   Calcium Carb-Cholecalciferol (CALCIUM 500 + D PO) Take 1 tablet by mouth daily.     dexamethasone (DECADRON) 4 MG tablet Take 2 tablets daily x 3 days starting the day after cisplatin chemotherapy. Take with food. 30 tablet 1   enoxaparin (LOVENOX) 120 MG/0.8ML injection Inject 0.8 mLs (120 mg total) into the skin every 12 (twelve) hours for 28 days. 44.8 mL 0   glipiZIDE (GLUCOTROL) 10 MG tablet Take 1 tablet (10 mg total) by mouth 2 (two) times daily before a meal. (Patient taking differently: Take 10 mg by mouth daily before breakfast.) 180 tablet 3   glucose blood test strip 1 each by Other route as needed for other. accu chek aviva plusUse as instructed     LORazepam (ATIVAN) 0.5 MG tablet Take 1 tablet (0.5 mg total) by mouth every 8 (eight) hours as needed for anxiety (nausea vomiting). 60 tablet 0   magnesium oxide (MAG-OX) 400 (240 Mg) MG tablet Take 400 mg by mouth in the morning.     metFORMIN (GLUCOPHAGE) 500 MG tablet TAKE TWO TABLETS BY MOUTH EVERY MORNING WITH BREAKFAST 180 tablet 1   MUCINEX D MAX STRENGTH [phone] MG TB12 Take 1,200 mg by mouth 2 (two) times daily as needed (for congestion or to loosen mucous in the chest).     ondansetron (ZOFRAN) 8 MG tablet Take 1 tablet (8 mg total) by mouth every 8 (eight) hours as needed for nausea or vomiting. Start on the third day after cisplatin. 30 tablet 1   pravastatin (PRAVACHOL) 20 MG tablet Take 1 tablet (20 mg total) by mouth daily. 90 tablet 3   prochlorperazine (COMPAZINE) 10 MG tablet Take 1 tablet (10 mg total) by mouth every 6 (six) hours as needed (Nausea or vomiting). 30 tablet 1   tadalafil (CIALIS)  20 MG tablet Take 0.5-1 tablets (10-20 mg total) by mouth every other day as needed for erectile dysfunction. 20 tablet 11   triamcinolone cream (KENALOG) 0.1 % Apply 1 Application topically 3 (three) times daily. (Patient taking differently: Apply 1 Application topically 3 (three) times daily as needed (for bilateral,  below-the-knees skin irritation).) 453.6 g 1   No current facility-administered medications for this visit.    No Known Allergies  Review of Systems  Constitutional:  Positive for unexpected weight change (lost 17 pounds last 3 months). Negative for activity change, chills and fever.  Eyes:  Negative for visual disturbance.  Respiratory:  Positive for cough and shortness of breath. Negative for wheezing.   Cardiovascular:  Positive for leg swelling. Negative for chest pain.  Genitourinary:  Negative for difficulty urinating and dysuria.  Neurological:  Negative for seizures, syncope and weakness.  Hematological:  Negative for adenopathy. Bruises/bleeds easily.  All other systems reviewed and are negative.   BP (!) 149/75   Pulse 85   Resp 20   Ht '6\' 3"'$  (1.905 m)   Wt 284 lb (128.8 kg)   SpO2 95% Comment: RA  BMI 35.50 kg/m  Physical Exam Vitals reviewed.  Constitutional:      General: He is not in acute distress.    Appearance: Normal appearance. He is obese.  HENT:     Head: Normocephalic and atraumatic.  Eyes:     General: No scleral icterus.    Extraocular Movements: Extraocular movements intact.  Cardiovascular:     Rate and Rhythm: Normal rate and regular rhythm.     Heart sounds: Normal heart sounds. No murmur heard.    No friction rub. No gallop.  Pulmonary:     Effort: Pulmonary effort is normal.     Breath sounds: Normal breath sounds.  Musculoskeletal:     Cervical back: Neck supple.  Lymphadenopathy:     Cervical: No cervical adenopathy.  Neurological:     Mental Status: He is alert.     Diagnostic Tests: CT CHEST WITHOUT CONTRAST    TECHNIQUE: Multidetector CT imaging of the chest was performed following the standard protocol without IV contrast.   RADIATION DOSE REDUCTION: This exam was performed according to the departmental dose-optimization program which includes automated exposure control, adjustment of the mA and/or kV according to patient size and/or use of iterative reconstruction technique.   COMPARISON:  CT 10/12/2022, 09/15/2022, PET CT 09/12/2022, chest CT 08/16/2022   FINDINGS: Cardiovascular: Limited evaluation without intravenous contrast. Mild aortic atherosclerosis. No aneurysm. Enlarged pulmonary trunk up to 3.9 cm. Coronary vascular calcification. Normal cardiac size. No pericardial effusion   Mediastinum/Nodes: Midline trachea. Enlarged heterogeneous left lobe of thyroid, This has been evaluated on previous imaging. (ref: J Am Coll Radiol. 2015 Feb;12(2): 143-50). Subcentimeter mediastinal lymph nodes. Esophagus within normal limits.   Lungs/Pleura: Bilateral heterogeneous ground-glass densities, greatest within the right middle lobe and bilateral lower lobes appears slightly worse as compared with CT from 12/15. No pleural effusion or pneumothorax.   Upper Abdomen: Hypodense liver mass. Complex cystic lesion upper pole right kidney with calcification, this was previously evaluated with MRI, no specific imaging follow-up is recommended.   Musculoskeletal: No acute osseous abnormality   IMPRESSION: 1. Heterogeneous bilateral ground-glass disease, multilobar but worst in the right middle lobe, lingula and lower lobes. There is progression of the abnormality since 10/12/2022. Though pneumonia could produce this appearance, given persistence and fluctuating appearance since October, consider alternative entities such as hypersensitivity pneumonitis or idiopathic interstitial pneumonia. 2. Slightly enlarged pulmonary trunk, possible arterial hypertension 3. Redemonstrated large  hypodense right hepatic lobe mass.   Aortic Atherosclerosis (ICD10-I70.0).     Electronically Signed   By: Donavan Foil M.D.   On: 10/30/2022 21:49   I personally reviewed the CT images.  Bilateral  groundglass opacities with relative sparing of the right upper lobe and left apex.  Impression: Christopher Marsh is a 62 year old man with recurrent pneumonias, groundglass opacities in both lungs, cholangiocarcinoma, hypertension, hyperlipidemia, type 2 diabetes without complication, antiphospholipid antibody, and DVT on lifelong anticoagulation.  Recurrent pneumonias led to finding of diffuse bilateral groundglass opacities with relative sparing of the upper lobes.  He needs a lung biopsy to provide a definitive diagnosis and guide treatment.  He had questions about whether he should go through with it given his limited life expectancy with cholangiocarcinoma.  I obviously cannot answer that question for him, but we discussed the issues involved.  There is no guarantee that there will lead to a cure of the lung issue nor is there any guarantee that it will result in significant improvement in his respiratory symptoms.  I described the proposed operative procedure to Mr. and Mrs. Fennel.  The plan would be to do a left VATS for lung biopsy.  I informed them of the general nature of the procedure including the need for general anesthesia, the incisions to be used, the use of the surgical robot, the use of a drainage tube postoperatively, the expected hospital stay, and the overall recovery.  I informed him of the indications, risk, benefits, and alternatives.  He understands this operation is diagnostic and not therapeutic.  He understands the risks include, but not limited to death, MI, DVT, PE, bleeding, possible need for transfusion, infection, prolonged air leak, cardiac arrhythmias, as well as possibility of other unforeseeable complications.  He is a high risk patient given his history  of DVT and antiphospholipid syndrome and cholangiocarcinoma.  He will need to hold Lovenox for 24 hours prior to surgery.  Will start back as soon as we are comfortable postoperatively.  Plan: Robotic left VATS for lung biopsy on Wednesday to 04/2023 Hold Lovenox the day prior to procedure  I spent over 45 minutes in review of records, images, and in consultation with Mr. Briggs today. Melrose Nakayama, MD [facility] [phone]

## 2022-11-13 ENCOUNTER — Encounter: Payer: Self-pay | Admitting: *Deleted

## 2022-11-14 ENCOUNTER — Ambulatory Visit: Payer: BC Managed Care – PPO | Admitting: Pulmonary Disease

## 2022-11-14 ENCOUNTER — Encounter: Payer: Self-pay | Admitting: Pulmonary Disease

## 2022-11-14 VITALS — BP 126/68 | HR 87 | Temp 98.6°F | Wt 293.4 lb

## 2022-11-14 DIAGNOSIS — J189 Pneumonia, unspecified organism: Secondary | ICD-10-CM | POA: Diagnosis not present

## 2022-11-14 MED ORDER — AMOXICILLIN-POT CLAVULANATE 875-125 MG PO TABS: 1.0000 | ORAL_TABLET | Freq: Two times a day (BID) | ORAL | 0 refills | Status: AC

## 2022-11-14 NOTE — Patient Instructions (Signed)
Nice to see you again  I think it is okay to cancel the surgery, lung biopsy  Given your response to antibiotics, improvement in symptoms, I think likely there is aspiration, possibly silently at night, causing changes in symptoms and on the CT scans.  I sent a prescription for Augmentin for you to have on hand when you feel like you need it.  If you need it, when you start taking it, please see me a message to let me know.  Return to clinic in 3 months or sooner if needed with Dr. Silas Flood

## 2022-11-15 ENCOUNTER — Other Ambulatory Visit: Payer: Self-pay | Admitting: *Deleted

## 2022-11-16 ENCOUNTER — Other Ambulatory Visit: Payer: Self-pay | Admitting: Oncology

## 2022-11-16 NOTE — Progress Notes (Signed)
$'@Patient'E$  ID: Christopher Marsh, male    DOB: November 25, 1960, 62 y.o.   MRN: [id number]  Chief Complaint  Patient presents with   Follow-up    Pt is here for follow up for PNA. Pt states that he is feeling a lot better since last visit. He states he can breath a lot easier now. Pt had CT scan on 10/30/22    Referring provider: Laurey Morale, MD  HPI:   62 y.o. man whom are seen for follow-up follow-up for multifocal infiltrates on chest imaging, waxing and waning over time.  Returns for scheduled follow-up.  In the interim has had 2 CT scans.  For CT scan on my review interpretation showed improvement in multifocal groundglass infiltrates.  Repeat CT scan was obtained by his oncologist for oncologic purposes, this showed worsening of subsequent waxing and waning infiltrates.  On discussion with him today, he was feeling ill around that time, fever, cough.  He contacted me shortly thereafter and request antibiotic therapy.  I was prescribed to him.  He states this markedly improved the cough etc.  We discussed that given the waxing waning nature of the infiltrates and clinical improvement as well documenting radiologic improvement with antibiotics, it is likely recurrent pneumonia that I feel probably related to aspiration.  He was referred to cardiothoracic surgery in interim.  Met with him.  For surgical lung biopsy given inflammatory pneumonia versus lymphangitic spread of abdominal cancer in the differential.  However, he wishes to cancel the surgery which we discussed in detail.  HPI initial visit: Patient was admitted to the hospital 07/2022 with malaise, cough, shortness of breath.  Chest x-ray 07/19/2022 my review interpretation reveals bilateral left greater than right multifocal infiltrates.  When compared to 07/2021, left side appears slightly worse but similar, new infiltrates on the right on my review and interpretation.  Patient was admitted to the hospital 07/2022 with malaise, cough,  shortness of breath.  This was preceded by a CT chest a couple days prior to my review interpretation reveals scattered nodular groundglass opacities most prominent in the left base but also in the lingula, mild in the right base.  Also revealed liver mass.  This was further work-up revealed metastatic cholangiocarcinoma.  He was treated with antibiotics.  His initial symptoms improved.  However 3 to 4 days later, similar symptoms recurred.  He was again admitted to the hospital 08/2022.  In the interim, he had a PET scan that showed worsening left greater than right groundglass opacities with worsening in the right base on my review and interpretation.  Upon readmission to the hospital 08/2022 CTA PE protocol was obtained that showed no PE but worsening multifocal infiltrates and certainly worse in the right based on my review and interpretation.  Also developing mild bronchiectasis in the bases.  He was treated with IV antibiotics and completed additional 5 days of p.o. levofloxacin to complete 7-day course of antibiotics.  Again his symptoms have improved.  Breathing feels better.  He had mild hypoxemia upon admission.  No longer present.  He had a swallow evaluation in the hospital 08/2022, barium swallow that was normal.  Questionaires / Pulmonary Flowsheets:   ACT:      No data to display           MMRC:     No data to display           Epworth:      No data to display  Tests:   FENO:  No results found for: "NITRICOXIDE"  PFT:     No data to display           WALK:      No data to display           Imaging: Personally reviewed per EMR and discussion in this note CT Chest Wo Contrast  Result Date: 10/30/2022 CLINICAL DATA:  Follow-up pneumonia EXAM: CT CHEST WITHOUT CONTRAST TECHNIQUE: Multidetector CT imaging of the chest was performed following the standard protocol without IV contrast. RADIATION DOSE REDUCTION: This exam was performed  according to the departmental dose-optimization program which includes automated exposure control, adjustment of the mA and/or kV according to patient size and/or use of iterative reconstruction technique. COMPARISON:  CT 10/12/2022, 09/15/2022, PET CT 09/12/2022, chest CT 08/16/2022 FINDINGS: Cardiovascular: Limited evaluation without intravenous contrast. Mild aortic atherosclerosis. No aneurysm. Enlarged pulmonary trunk up to 3.9 cm. Coronary vascular calcification. Normal cardiac size. No pericardial effusion Mediastinum/Nodes: Midline trachea. Enlarged heterogeneous left lobe of thyroid, This has been evaluated on previous imaging. (ref: J Am Coll Radiol. 2015 Feb;12(2): 143-50). Subcentimeter mediastinal lymph nodes. Esophagus within normal limits. Lungs/Pleura: Bilateral heterogeneous ground-glass densities, greatest within the right middle lobe and bilateral lower lobes appears slightly worse as compared with CT from 12/15. No pleural effusion or pneumothorax. Upper Abdomen: Hypodense liver mass. Complex cystic lesion upper pole right kidney with calcification, this was previously evaluated with MRI, no specific imaging follow-up is recommended. Musculoskeletal: No acute osseous abnormality IMPRESSION: 1. Heterogeneous bilateral ground-glass disease, multilobar but worst in the right middle lobe, lingula and lower lobes. There is progression of the abnormality since 10/12/2022. Though pneumonia could produce this appearance, given persistence and fluctuating appearance since October, consider alternative entities such as hypersensitivity pneumonitis or idiopathic interstitial pneumonia. 2. Slightly enlarged pulmonary trunk, possible arterial hypertension 3. Redemonstrated large hypodense right hepatic lobe mass. Aortic Atherosclerosis (ICD10-I70.0). Electronically Signed   By: Donavan Foil M.D.   On: 10/30/2022 21:49    Lab Results: Personally reviewed CBC    Component Value Date/Time   WBC 4.9  11/08/2022 0803   RBC 4.97 11/08/2022 0803   HGB 15.4 11/08/2022 0803   HCT 45.3 11/08/2022 0803   PLT 299 11/08/2022 0803   MCV 91.1 11/08/2022 0803   MCH 31.0 11/08/2022 0803   MCHC 34.0 11/08/2022 0803   RDW 12.6 11/08/2022 0803   LYMPHSABS 0.6 (L) 11/08/2022 0803   MONOABS 0.5 11/08/2022 0803   EOSABS 0.0 11/08/2022 0803   BASOSABS 0.1 11/08/2022 0803    BMET    Component Value Date/Time   NA 135 11/08/2022 0803   K 4.7 11/08/2022 0803   CL 97 (L) 11/08/2022 0803   CO2 29 11/08/2022 0803   GLUCOSE 114 (H) 11/08/2022 0803   BUN 9 11/08/2022 0803   CREATININE 0.79 11/08/2022 0803   CALCIUM 9.1 11/08/2022 0803   GFRNONAA >60 11/08/2022 0803   GFRAA >60 07/24/2017 0439    BNP    Component Value Date/Time   BNP 29.2 09/15/2022 1645    ProBNP No results found for: "PROBNP"  Specialty Problems       Pulmonary Problems   Pneumonia   Multifocal pneumonia   Snoring   Lung infiltrate on CT    No Known Allergies  Immunization History  Administered Date(s) Administered   DT (Pediatric) 03/22/2018   Influenza Whole 09/14/2009   Influenza, Quadrivalent, Recombinant, Inj, Pf 08/18/2019   Influenza,inj,Quad PF,6+ Mos 08/28/2016, 07/23/2017, 10/20/2018,  08/08/2022   Influenza,inj,quad, With Preservative 10/29/2017   Influenza-Unspecified 10/12/2021   PFIZER(Purple Top)SARS-COV-2 Vaccination 12/31/2019   PNEUMOCOCCAL CONJUGATE-20 08/08/2022   Pneumococcal Conjugate-13 08/28/2016   Pneumococcal Polysaccharide-23 10/20/2018   Td 12/08/2008, 03/22/2018    Past Medical History:  Diagnosis Date   Antiphospholipid antibody syndrome (HCC)    Arthritis    Diabetes mellitus without complication (HCC)    DVT (deep venous thrombosis) (HCC)    Lt leg   Dyspnea    Elevated lipids    Erythrocytosis    Hyperlipidemia    Hypertension    Lower extremity edema    Multinodular thyroid    Post-thrombotic syndrome     Tobacco History: Social History   Tobacco Use   Smoking Status Former   Packs/day: 0.50   Years: 6.00   Total pack years: 3.00   Types: Cigarettes   Quit date: 1982   Years since quitting: 42.0  Smokeless Tobacco Never  Tobacco Comments   Pt states when he did smoke he smoked at most 1 ppd. ALS 09/26/2022   Counseling given: Not Answered Tobacco comments: Pt states when he did smoke he smoked at most 1 ppd. ALS 09/26/2022   Continue to not smoke  Outpatient Encounter Medications as of 11/14/2022  Medication Sig   albuterol (VENTOLIN HFA) 108 (90 Base) MCG/ACT inhaler Inhale 2 puffs into the lungs every 4 (four) hours as needed.   amLODipine (NORVASC) 10 MG tablet Take 1 tablet (10 mg total) by mouth daily.   amoxicillin-clavulanate (AUGMENTIN) 875-125 MG tablet Take 1 tablet by mouth 2 (two) times daily for 10 days.   benzonatate (TESSALON) 200 MG capsule Take 1 capsule (200 mg total) by mouth 3 (three) times daily as needed for cough.   Calcium Carb-Cholecalciferol (CALCIUM 500 + D PO) Take 1 tablet by mouth daily.   dexamethasone (DECADRON) 4 MG tablet Take 2 tablets daily x 3 days starting the day after cisplatin chemotherapy. Take with food.   enoxaparin (LOVENOX) 120 MG/0.8ML injection Inject 0.8 mLs (120 mg total) into the skin every 12 (twelve) hours for 28 days.   glipiZIDE (GLUCOTROL) 10 MG tablet Take 1 tablet (10 mg total) by mouth 2 (two) times daily before a meal. (Patient taking differently: Take 10 mg by mouth daily before breakfast.)   glucose blood test strip 1 each by Other route as needed for other. accu chek aviva plusUse as instructed   LORazepam (ATIVAN) 0.5 MG tablet Take 1 tablet (0.5 mg total) by mouth every 8 (eight) hours as needed for anxiety (nausea vomiting).   magnesium oxide (MAG-OX) 400 (240 Mg) MG tablet Take 400 mg by mouth in the morning.   metFORMIN (GLUCOPHAGE) 500 MG tablet TAKE TWO TABLETS BY MOUTH EVERY MORNING WITH BREAKFAST   MUCINEX D MAX STRENGTH [phone] MG TB12 Take 1,200 mg by mouth 2  (two) times daily as needed (for congestion or to loosen mucous in the chest).   ondansetron (ZOFRAN) 8 MG tablet Take 1 tablet (8 mg total) by mouth every 8 (eight) hours as needed for nausea or vomiting. Start on the third day after cisplatin.   pravastatin (PRAVACHOL) 20 MG tablet Take 1 tablet (20 mg total) by mouth daily.   prochlorperazine (COMPAZINE) 10 MG tablet Take 1 tablet (10 mg total) by mouth every 6 (six) hours as needed (Nausea or vomiting).   tadalafil (CIALIS) 20 MG tablet Take 0.5-1 tablets (10-20 mg total) by mouth every other day as needed for erectile dysfunction.  triamcinolone cream (KENALOG) 0.1 % Apply 1 Application topically 3 (three) times daily. (Patient taking differently: Apply 1 Application topically 3 (three) times daily as needed (for bilateral, below-the-knees skin irritation).)   No facility-administered encounter medications on file as of 11/14/2022.     Review of Systems  Review of Systems  N/a Physical Exam  BP 126/68 (BP Location: Left Arm, Patient Position: Sitting, Cuff Size: Normal)   Pulse 87   Temp 98.6 F (37 C) (Oral)   Wt 293 lb 6.4 oz (133.1 kg)   SpO2 97%   BMI 36.67 kg/m   Wt Readings from Last 5 Encounters:  11/14/22 293 lb 6.4 oz (133.1 kg)  11/12/22 284 lb (128.8 kg)  11/08/22 284 lb 2.8 oz (128.9 kg)  10/31/22 291 lb (132 kg)  10/16/22 289 lb 1.6 oz (131.1 kg)    BMI Readings from Last 5 Encounters:  11/14/22 36.67 kg/m  11/12/22 35.50 kg/m  11/08/22 35.52 kg/m  10/31/22 36.37 kg/m  10/16/22 36.14 kg/m     Physical Exam General: Sitting in chair, no acute distress Eyes: EOMI, no icterus Neck: Supple, no JVP Pulmonary: Good air movement, scattered rhonchi, normal work of breathing Cardiovascular: Warm, chronic venous stasis changes in lower extremities Abdomen: Nondistended, bowel sounds present MSK: No synovitis, no effusion Neuro: Normal gait, no weakness Psych: Normal mood, full affect  Assessment &  Plan:   Multifocal infiltrates: Present on the left on chest x-ray 07/2021.  Left greater than right particular lower lobes are present in the lingula as well 07/2022.  Worsened in density and worsening right lower lobe on CT images of PET scan 09/12/2022.  Interval worsening in the right lower lobe 09/15/2022.  Diffuse nearly nodular but areas of confluence groundglass opacities.  Given persistence do wonder about a recurrent process such as aspiration.  However, given metastatic cholangiocarcinoma, it is possible this represents lymphangitic spread.  Not necessarily consistent with images in 2022.  Pulmonary edema possible but he improved with antibiotics argues against this.  Denies any weight gain.  Noninfectious pneumonia, organizing pneumonia also possible.  He has symptoms of infection including fever, cough improved with antibiotic therapy.  Images have improved with antibiotic therapy but then recurred.  I think this is more consistent with likely recurrent aspiration pneumonitis than cancer or other noninfectious etiology.  He does not want go through with biopsy of the lung given his underlying cancer.  This is reasonable especially given response to antibiotics.   Return in about 3 months (around 02/13/2023).   Lanier Clam, MD 11/16/2022   This appointment required 42 minutes of patient care (this includes precharting, chart review, review of results, face-to-face care, etc.).

## 2022-11-17 ENCOUNTER — Other Ambulatory Visit: Payer: Self-pay | Admitting: Oncology

## 2022-11-19 ENCOUNTER — Inpatient Hospital Stay: Payer: BC Managed Care – PPO

## 2022-11-19 NOTE — Progress Notes (Signed)
[facility] CSW Progress Note  Clinical Education officer, museum returned patient's call.  Informed him that this CSW was helping cover for [employer], LCSW, who will return next week.  He stated he is having increased financial stress.  Discussed the [facility] Fee and he requested CSW make referral.  Sent information to Mort Sawyers, Development worker, community.  CSW provided active listening and supportive counseling.  He would like [place] to call him with any additional financial resources.  He expressed no other needs.    Rodman Pickle Kenyata Napier, LCSW

## 2022-11-21 ENCOUNTER — Inpatient Hospital Stay: Payer: BC Managed Care – PPO

## 2022-11-21 ENCOUNTER — Encounter: Payer: Self-pay | Admitting: Oncology

## 2022-11-21 ENCOUNTER — Inpatient Hospital Stay: Payer: BC Managed Care – PPO | Admitting: Oncology

## 2022-11-21 VITALS — BP 166/98 | HR 67 | Temp 98.2°F | Resp 18 | Wt 294.0 lb

## 2022-11-21 DIAGNOSIS — C221 Intrahepatic bile duct carcinoma: Secondary | ICD-10-CM

## 2022-11-21 DIAGNOSIS — D6861 Antiphospholipid syndrome: Secondary | ICD-10-CM

## 2022-11-21 DIAGNOSIS — J310 Chronic rhinitis: Secondary | ICD-10-CM | POA: Diagnosis not present

## 2022-11-21 DIAGNOSIS — Z5111 Encounter for antineoplastic chemotherapy: Secondary | ICD-10-CM | POA: Diagnosis not present

## 2022-11-21 DIAGNOSIS — Z86718 Personal history of other venous thrombosis and embolism: Secondary | ICD-10-CM | POA: Diagnosis not present

## 2022-11-21 DIAGNOSIS — R918 Other nonspecific abnormal finding of lung field: Secondary | ICD-10-CM | POA: Diagnosis not present

## 2022-11-21 DIAGNOSIS — Z5112 Encounter for antineoplastic immunotherapy: Secondary | ICD-10-CM | POA: Diagnosis not present

## 2022-11-21 DIAGNOSIS — Z7901 Long term (current) use of anticoagulants: Secondary | ICD-10-CM | POA: Diagnosis not present

## 2022-11-21 DIAGNOSIS — E875 Hyperkalemia: Secondary | ICD-10-CM

## 2022-11-21 DIAGNOSIS — Z79899 Other long term (current) drug therapy: Secondary | ICD-10-CM | POA: Diagnosis not present

## 2022-11-21 DIAGNOSIS — J029 Acute pharyngitis, unspecified: Secondary | ICD-10-CM

## 2022-11-21 LAB — CBC WITH DIFFERENTIAL/PLATELET
Abs Immature Granulocytes: 0.06 10*3/uL (ref 0.00–0.07)
Basophils Absolute: 0 10*3/uL (ref 0.0–0.1)
Basophils Relative: 0 %
Eosinophils Absolute: 0 10*3/uL (ref 0.0–0.5)
Eosinophils Relative: 0 %
HCT: 42.9 % (ref 39.0–52.0)
Hemoglobin: 13.8 g/dL (ref 13.0–17.0)
Immature Granulocytes: 1 %
Lymphocytes Relative: 7 %
Lymphs Abs: 0.4 10*3/uL — ABNORMAL LOW (ref 0.7–4.0)
MCH: 30.7 pg (ref 26.0–34.0)
MCHC: 32.2 g/dL (ref 30.0–36.0)
MCV: 95.3 fL (ref 80.0–100.0)
Monocytes Absolute: 0.4 10*3/uL (ref 0.1–1.0)
Monocytes Relative: 7 %
Neutro Abs: 4.9 10*3/uL (ref 1.7–7.7)
Neutrophils Relative %: 85 %
Platelets: 170 10*3/uL (ref 150–400)
RBC: 4.5 MIL/uL (ref 4.22–5.81)
RDW: 13.2 % (ref 11.5–15.5)
WBC: 5.8 10*3/uL (ref 4.0–10.5)
nRBC: 0 % (ref 0.0–0.2)

## 2022-11-21 LAB — COMPREHENSIVE METABOLIC PANEL
ALT: 26 U/L (ref 0–44)
AST: 22 U/L (ref 15–41)
Albumin: 3.3 g/dL — ABNORMAL LOW (ref 3.5–5.0)
Alkaline Phosphatase: 62 U/L (ref 38–126)
Anion gap: 11 (ref 5–15)
BUN: 12 mg/dL (ref 8–23)
CO2: 26 mmol/L (ref 22–32)
Calcium: 8.6 mg/dL — ABNORMAL LOW (ref 8.9–10.3)
Chloride: 98 mmol/L (ref 98–111)
Creatinine, Ser: 0.83 mg/dL (ref 0.61–1.24)
GFR, Estimated: >60 mL/min (ref >60)
Glucose, Bld: 363 mg/dL — ABNORMAL HIGH (ref 70–99)
Potassium: 5.2 mmol/L — ABNORMAL HIGH (ref 3.5–5.1)
Sodium: 135 mmol/L (ref 135–145)
Total Bilirubin: 0.2 mg/dL — ABNORMAL LOW (ref 0.3–1.2)
Total Protein: 6.3 g/dL — ABNORMAL LOW (ref 6.5–8.1)

## 2022-11-21 LAB — MAGNESIUM: Magnesium: 1.9 mg/dL (ref 1.7–2.4)

## 2022-11-21 LAB — TSH: TSH: 0.38 u[IU]/mL (ref 0.350–4.500)

## 2022-11-21 MED ORDER — AZELASTINE & FLUTICASONE 137 & 50 MCG/ACT NA THPK: 1.0000 | PACK | Freq: Every day | NASAL | 1 refills | Status: DC | PRN

## 2022-11-21 MED FILL — Dexamethasone Sodium Phosphate Inj 100 MG/10ML: INTRAMUSCULAR | Qty: 1 | Status: AC

## 2022-11-21 MED FILL — Fosaprepitant Dimeglumine For IV Infusion 150 MG (Base Eq): INTRAVENOUS | Qty: 5 | Status: AC

## 2022-11-21 NOTE — Assessment & Plan Note (Signed)
Mildly elevated. Recommend patient to avoid food with high potassium.  Repeat labs in 1 week

## 2022-11-21 NOTE — Assessment & Plan Note (Signed)
#  Recurrent lower extremity DVT, secondary to antiphospholipid syndrome.-.  Persistent lupus anticoagulant positive.Recommend life time anticoagulation Patient was previously on Coumadin now on Lovenox. continue Lovenox 1 mg/kg twice daily  Possible plan of switch to 1.'5mg'$ /kg daily if PET shows improvement.

## 2022-11-21 NOTE — Assessment & Plan Note (Signed)
?  Possible GERD.  Recommend omeprazole. Patient prefers to try otc supply

## 2022-11-21 NOTE — Assessment & Plan Note (Signed)
He follows up with pulmonology.  repeat CT images were reviewed and discussed with patient. I also discussed with pulmonology Dr. Hunsucker who recommends surgical biopsy. DDX includes organizing pneumonia, ILD, aspiration, lymphatic spread, etc. He declined repeat bronchoscopy/biopsy.    

## 2022-11-21 NOTE — Assessment & Plan Note (Signed)
azelastine nasal spray PRN 

## 2022-11-21 NOTE — Progress Notes (Signed)
Hematology/Oncology Progress note [phone] [phone]      ASSESSMENT & PLAN:   Cancer Staging  Cholangiocarcinoma [name]) Staging form: Intrahepatic Bile Duct, AJCC 8th Edition - Clinical stage from 08/27/2022: Stage IV (cT2, cN1, cM1) - Signed by Earlie Server, MD on 09/13/2022   Cholangiocarcinoma [name]) Imaging findings and pathology results were reviewed with patient.  Clinical diagnosis is cholangiocarcinoma, although atypical with both normal CEA and CA 19.9 Labs are reviewed and discussed with patient. S/p 2 cycles  Gemcitabine, Cisplatin and Durvalumab.  Overall he tolerates very well.  Proceed with cycle 3 D1 Cisplatin Gemcitabine Durvalumab, he will return in 1 week of D8 Gemcitabine and Cisplatin treatment.  Repeat PET    Antiphospholipid syndrome (HCC) #Recurrent lower extremity DVT, secondary to antiphospholipid syndrome.-.  Persistent lupus anticoagulant positive.Recommend life time anticoagulation Patient was previously on Coumadin now on Lovenox. continue Lovenox 1 mg/kg twice daily  Possible plan of switch to 1.'5mg'$ /kg daily if PET shows improvement.   Hypocalcemia Continue calcium supplementation.   Lung infiltrate on CT He follows up with pulmonology.  repeat CT images were reviewed and discussed with patient. I also discussed with pulmonology Dr. Silas Flood who recommends surgical biopsy. DDX includes organizing pneumonia, ILD, aspiration, lymphatic spread, etc. He declined repeat bronchoscopy/biopsy.     Sore throat ? Possible GERD.  Recommend omeprazole. Patient prefers to try otc supply  Rhinitis azelastine nasal spray PRN  Hyperkalemia Mildly elevated. Recommend patient to avoid food with high potassium.  Repeat labs in 1 week  Orders Placed This Encounter  Procedures   NM PET Image Restag (PS) Skull Base To Thigh    Standing Status:   Future    Standing Expiration Date:   11/21/2023    Order Specific Question:   If indicated  for the ordered procedure, I authorize the administration of a radiopharmaceutical per Radiology protocol    Answer:   Yes    Order Specific Question:   Preferred imaging location?    Answer:   [place]    Follow up Per LOS  All questions were answered. The patient knows to call the clinic with any problems, questions or concerns.  Earlie Server, MD, PhD [facility] Hematology Oncology 11/21/2022   CHIEF COMPLAINTS/REASON FOR VISIT:  Follow up for right lower extremity DVT, antiphospholipid syndrome, metastatic cholangiocarcinoma. HISTORY OF PRESENTING ILLNESS:  Patient reports remote history of left lower extremity DVT in 2002.  He was initiated on Lovenox and bridged to Coumadin.  Patient took warfarin for 2 years before anticoagulation was stopped. 12/03/2021, patient presented to emergency room for evaluation of right lower extremity pain and swelling for about a week.  Started on the inner side of right thigh and migrated to the right calf. + Associated with swelling.  Denies any recent injury, hospitalization, surgery.  He first noticed the symptoms after playing basketball with his grandson.   12/03/2021, right lower extremity ultrasound showed occlusive DVT extending from the mid aspect of the femoral vein through the imaged tibial vein.  Age-indeterminate. Patient was started on Xarelto. He was referred to establish care with vascular surgeon and was seen by Christopher Marsh on 12/06/2021.  Shared decision was made not to proceed with embolectomy.  Continue anticoagulation. Patient was referred to hematology oncology for further evaluation.  Patient denies any family history of blood clots.  Denies any unintentional weight loss, fever or night sweats. 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  He sometimes stay in his car while waiting for next assessment appointment.  He reports the right lower  extremity symptom has improved since the start of Xarelto.  No active bleeding events.  #12/18/2021 hypercoagulable work-up showed JAK2 V617F mutation negative, with reflex to other mutations CALR, MPL, JAK 2 Ex 12-15 mutations negative, negative anticardiolipin IgG and IgM antibodies, positive lupus anticoagulant, negative factor V Leiden mutation, negative prothrombin gene mutation, normal protein C activity, normal protein S antigen level. Patient was recommended to switch to Coumadin with INR goal of 2-3.  #03/15/2022, repeat lupus anticoagulant is persistently positive- + antiphospholipid syndrome.  Patient is currently on Lovenox 1 mg/kg twice daily.  admitted due to multifocal pneumonia, treated with Influenza panel negative, COVID-negative, HIV negative, Complete course of antibiotics with Zithromax, Vantin   + HCV antibody positive, HCV RNA quantification not detected. HCV RT PCR not detected.   INTERVAL HISTORY Christopher Marsh is a 62 y.o. male who has above history reviewed by me today presents for follow up visit for management of right lower extremity DVT and antiphospholipid syndrome, metastatic cholangiocarcinoma.  Oncology History  Cholangiocarcinoma ([place])  10/30/2021 Imaging   CT chest wo contrast 1. Heterogeneous bilateral ground-glass disease, multilobar but worst in the right middle lobe, lingula and lower lobes. There is progression of the abnormality since 10/12/2022. Though pneumonia could produce this appearance, given persistence and fluctuating appearance since October, consider alternative entities such as hypersensitivity pneumonitis or idiopathic interstitial pneumonia. 2. Slightly enlarged pulmonary trunk, possible arterial hypertension 3. Redemonstrated large hypodense right hepatic lobe mass.   08/18/2022 Imaging   CT chest w contrast showed 1. Multifocal airspace disease in both lungs, left side greater than right. Findings are most compatible with multifocal  pneumonia. 2. Multiple new hepatic lesions with enlarging lymph nodes in the upper abdomen and chest. Findings are concerning for metastatic disease. Based on the multifocal pneumonia, hepatic abscesses would also be in the differential diagnosis. Recommend further characterization of the abdomen and pelvis with CT with IV contrast. 3. Enlargement of the main pulmonary artery could be associated with pulmonary hypertension. 4. Coronary artery calcifications. 5. Multinodular goiter. Patient has known thyroid nodules and previous thyroid ultrasound.    08/18/2022 Imaging   CT abdomen pelvis w contrast  Interval development of 6.5 x 5.2 cm heterogeneously enhancing mass in right hepatic lobe. This is highly concerning for neoplasm or malignancy, and further evaluation with MRI is recommended.   Mildly enlarged periaortic adenopathy is noted, with the largest lymph node measuring 12 mm. Metastatic disease cannot be excluded. Mildly enlarged adenopathy is also noted in the gastrohepatic ligament, but this is unchanged compared to prior exam.   Stable bibasilar lung opacities are noted concerning for inflammation, atelectasis or possibly scarring.   Grossly stable multi-septated cystic lesion is seen involving upper pole of right kidney with peripheral calcifications compared to prior exam of 2019, most likely representing benign etiology.   Aortic Atherosclerosis    08/18/2022 - 08/21/2022 Hospital Admission   Admitted due to multifocal pneumonia, treated with Influenza panel negative, COVID-negative, HIV negative, Complete course of antibiotics with Zithromax, Vantin     08/19/2022 Imaging   MR abdomen w wo contrast  Marked caudate lobe hypertrophy, highly suspicious for hepatic cirrhosis.   Numerous hypervascular masses throughout the right hepatic lobe, highly suspicious for multifocal hepatocellular carcinoma. Recommend correlation with AFP and consider tissue sampling.   Mild abdominal  lymphadenopathy, with differential diagnosis including metastatic disease  and reactive lymphadenopathy in setting of cirrhosis.   Bilateral lower lobe infiltrates, as better demonstrated on recent CT.    08/19/2022 Tumor Marker   AFP 4.1   08/27/2022 Initial Diagnosis   Cholangiocarcinoma  -08/20/22 Liver mass biopsy showed poorly differentiated carcinoma Immunohistochemical stains show that the poorly differentiated carcinoma is positive for CK7 and MOC-31, suggestive of a poorly differentiated adenocarcinoma.  Immunohistochemical stains for CK20, CDX2, HepPar 1 and arginase are negative.  Immunostain for glypican-3 shows very focal  likely nonspecific labeling.  This immunoprofile is nonspecific but can be compatible with a poorly differentiated primary cholangiocarcinoma in absence of any other lesions.   NGS showed BAP 1 mutation.  Case was presented at tumor board, I have also discussed with pathologist Dr.Rubinas,  IHC pattern is more consistent with adenocarcinoma, unlikely [place] [due to negative HepPar 1 and arginase], cholangiocarcinoma is favored, although this is a diagnosis of exclusion. Second opinion ar [facility]   08/27/2022 Cancer Staging   Staging form: Intrahepatic Bile Duct, AJCC 8th Edition - Clinical stage from 08/27/2022: Stage IV (cT2, cN1, cM1) - Signed by Earlie Server, MD on 09/13/2022 Stage prefix: Initial diagnosis   08/27/2022 Tumor Marker   CA 19.9  16 CEA 2.8   09/12/2022 Imaging   PET scan showed 1. Large right hepatic lobe mass is hypermetabolic and consistent with known cholangiocarcinoma. 2. Scattered borderline enlarged upper abdominal lymph nodes do not show any significant FDG uptake. 3. 4 hypermetabolic bone lesions consistent with metastatic disease. 4. Progressive diffuse airspace process in the left lung could be due to atypical pneumonia, pulmonary hemorrhage or left-sided aspiration. 5. Aortic atherosclerosis.  09/12/2022 I had a phone discussion  with patient after his PET scan resulted.  Recommend systemic chemotherapy.  Patient would like to defer until his second opinion visit at [facility].   09/15/2022 Imaging   CT angio chest pulmonary embolism protocol showed 1. No evidence for pulmonary embolism. 2. Progression of bilateral multifocal patchy ground-glass and interstitial opacities, left greater than right. Findings are concerning for multifocal pneumonia. 3. Air-fluid levels in the left lower lobe bronchi worrisome for aspiration. 4. Mediastinal and hilar lymphadenopathy has increased in size and number when compared to the prior study, likely reactive.5. Cardiomegaly. 6. Findings compatible with pulmonary artery hypertension.7. Stable right liver mass and complex right renal cystic lesion. 8. Stable enlargement of the left thyroid.   09/15/2022 - 09/17/2022 Hospital Admission   Patient was admitted due to shortness of breath, CT showed progression of bilateral multifocal patchy groundglass and interstitial opacities.  Left greater than right.  Concerning for multifocal pneumonia.  Patient was started on broad-spectrum antibiotics with significant improvement.  He was discharged on oral antibiotics.   09/19/2022 Miscellaneous   Patient went to [facility] for second opinion.  He was seen by Stanton Kidney, who felt that the presentation is consistent with metastatic cholangiocarcinoma, and agrees with the plan of cisplatin/gemcitabine/durvalumab..    10/11/2022 -  Chemotherapy   Patient is on Treatment Plan : Cisplatin D1,8 + Gemcitabine + Durvalumab  D1,8 q21d x     10/12/2022 Imaging   CT chest w contrast 1. Bilateral airspace and ground-glass opacities have mildly decreased in amount and density when compared to the prior study. Findings are compatible with resolving multifocal pneumonia. Continued follow-up imaging recommended. 2. Stable enlarged heterogeneous left thyroid gland with 3.2 cm nodule. This can be further evaluated with  dedicated ultrasound. 3. Stable hepatic and right renal lesions, incompletely evaluated.   Aortic Atherosclerosis (  ICD10-I70.0).     He tolerated chemotherapy. + fatigue after treatment. + mild cough, chronic. No nausea vomiting diarrhea + increased congestion and sore throat for over a week. No fever, chills.   Review of Systems  Constitutional:  Positive for fatigue. Negative for appetite change, chills and fever.  HENT:   Negative for hearing loss and voice change.   Eyes:  Negative for eye problems.  Respiratory:  Positive for shortness of breath. Negative for chest tightness and cough.   Cardiovascular:  Negative for chest pain.  Gastrointestinal:  Negative for abdominal distention, abdominal pain and blood in stool.  Endocrine: Negative for hot flashes.  Genitourinary:  Negative for difficulty urinating and frequency.   Musculoskeletal:  Negative for arthralgias.  Skin:  Negative for itching and rash.  Neurological:  Negative for extremity weakness.  Hematological:  Negative for adenopathy. Bruises/bleeds easily.  Psychiatric/Behavioral:  Negative for confusion.     MEDICAL HISTORY:  Past Medical History:  Diagnosis Date   Antiphospholipid antibody syndrome (HCC)    Arthritis    Diabetes mellitus without complication (HCC)    DVT (deep venous thrombosis) (HCC)    Lt leg   Dyspnea    Elevated lipids    Erythrocytosis    Hyperlipidemia    Hypertension    Lower extremity edema    Multinodular thyroid    Post-thrombotic syndrome     SURGICAL HISTORY: Past Surgical History:  Procedure Laterality Date   COLONOSCOPY WITH PROPOFOL N/A 09/23/2018   Per Dr. Alice Reichert, polyp, repeat in 3 yrs    EYE MUSCLE SURGERY     JOINT REPLACEMENT Left 07/22/2017   TOTAL HIP ARTHROPLASTY Left 07/22/2017   Procedure: TOTAL HIP ARTHROPLASTY;  Surgeon: Dereck Leep, MD;  Location: [facility] ORS;  Service: Orthopedics;  Laterality: Left;    SOCIAL HISTORY: Social History   Socioeconomic  History   Marital status: Married    Spouse name: Not on file   Number of children: Not on file   Years of education: Not on file   Highest education level: Not on file  Occupational History   Not on file  Tobacco Use   Smoking status: Former    Packs/day: 0.50    Years: 6.00    Total pack years: 3.00    Types: Cigarettes    Quit date: 77    Years since quitting: 42.0   Smokeless tobacco: Never   Tobacco comments:    Pt states when he did smoke he smoked at most 1 ppd. ALS 09/26/2022  Vaping Use   Vaping Use: Never used  Substance and Sexual Activity   Alcohol use: Yes    Comment: 2-3 nights a week   Drug use: No   Sexual activity: Not on file  Other Topics Concern   Not on file  Social History Narrative   Not on file   Social Determinants of Health   Financial Resource Strain: Low Risk  (08/27/2022)   Overall Financial Resource Strain (CARDIA)    Difficulty of Paying Living Expenses: Not very hard  Food Insecurity: No Food Insecurity (09/15/2022)   Hunger Vital Sign    Worried About Running Out of Food in the Last Year: Never true    Ran Out of Food in the Last Year: Never true  Transportation Needs: No Transportation Needs (09/15/2022)   PRAPARE - Hydrologist (Medical): No    Lack of Transportation (Non-Medical): No  Physical Activity: Insufficiently Active (08/27/2022)  Exercise Vital Sign    Days of Exercise per Week: 3 days    Minutes of Exercise per Session: 30 min  Stress: Stress Concern Present (08/27/2022)   [place]    Feeling of Stress : Rather much  Social Connections: Socially Integrated (08/27/2022)   Social Connection and Isolation Panel [NHANES]    Frequency of Communication with Friends and Family: More than three times a week    Frequency of Social Gatherings with Friends and Family: More than three times a week    Attends Religious Services: 1  to 4 times per year    Active Member of [employer] or Organizations: Yes    Attends Archivist Meetings: Never    Marital Status: Married  Human resources officer Violence: Not At Risk (09/15/2022)   Humiliation, Afraid, Rape, and Kick questionnaire    Fear of Current or Ex-Partner: No    Emotionally Abused: No    Physically Abused: No    Sexually Abused: No    FAMILY HISTORY: Family History  Problem Relation Age of Onset   Breast cancer Mother    Emphysema Father    Colon cancer Maternal Grandmother     ALLERGIES:  has No Known Allergies.  MEDICATIONS:  Current Outpatient Medications  Medication Sig Dispense Refill   albuterol (VENTOLIN HFA) 108 (90 Base) MCG/ACT inhaler Inhale 2 puffs into the lungs every 4 (four) hours as needed. 18 g 1   amLODipine (NORVASC) 10 MG tablet Take 1 tablet (10 mg total) by mouth daily. 30 tablet 0   Azelastine & Fluticasone 137 & 50 MCG/ACT THPK Place 1 spray into the nose daily as needed. 1 each 1   benzonatate (TESSALON) 200 MG capsule Take 1 capsule (200 mg total) by mouth 3 (three) times daily as needed for cough. 30 capsule 3   Calcium Carb-Cholecalciferol (CALCIUM 500 + D PO) Take 1 tablet by mouth daily.     dexamethasone (DECADRON) 4 MG tablet Take 2 tablets daily x 3 days starting the day after cisplatin chemotherapy. Take with food. 30 tablet 1   enoxaparin (LOVENOX) 120 MG/0.8ML injection Inject 0.8 mLs (120 mg total) into the skin every 12 (twelve) hours. 48 mL 2   glipiZIDE (GLUCOTROL) 10 MG tablet Take 1 tablet (10 mg total) by mouth 2 (two) times daily before a meal. (Patient taking differently: Take 10 mg by mouth daily before breakfast.) 180 tablet 3   glucose blood test strip 1 each by Other route as needed for other. accu chek aviva plusUse as instructed     LORazepam (ATIVAN) 0.5 MG tablet Take 1 tablet (0.5 mg total) by mouth every 8 (eight) hours as needed for anxiety (nausea vomiting). 60 tablet 0   magnesium oxide (MAG-OX) 400  (240 Mg) MG tablet Take 400 mg by mouth in the morning.     metFORMIN (GLUCOPHAGE) 500 MG tablet TAKE TWO TABLETS BY MOUTH EVERY MORNING WITH BREAKFAST 180 tablet 1   MUCINEX D MAX STRENGTH [phone] MG TB12 Take 1,200 mg by mouth 2 (two) times daily as needed (for congestion or to loosen mucous in the chest).     ondansetron (ZOFRAN) 8 MG tablet Take 1 tablet (8 mg total) by mouth every 8 (eight) hours as needed for nausea or vomiting. Start on the third day after cisplatin. 30 tablet 1   pravastatin (PRAVACHOL) 20 MG tablet Take 1 tablet (20 mg total) by mouth daily. 90 tablet 3  prochlorperazine (COMPAZINE) 10 MG tablet Take 1 tablet (10 mg total) by mouth every 6 (six) hours as needed (Nausea or vomiting). 30 tablet 1   tadalafil (CIALIS) 20 MG tablet Take 0.5-1 tablets (10-20 mg total) by mouth every other day as needed for erectile dysfunction. 20 tablet 11   triamcinolone cream (KENALOG) 0.1 % Apply 1 Application topically 3 (three) times daily. (Patient taking differently: Apply 1 Application topically 3 (three) times daily as needed (for bilateral, below-the-knees skin irritation).) 453.6 g 1   amoxicillin-clavulanate (AUGMENTIN) 875-125 MG tablet Take 1 tablet by mouth 2 (two) times daily for 10 days. (Patient not taking: Reported on 11/21/2022) 20 tablet 0   No current facility-administered medications for this visit.     PHYSICAL EXAMINATION: ECOG PERFORMANCE STATUS: 1 - Symptomatic but completely ambulatory Vitals:   11/21/22 1421  BP: (!) 166/98  Pulse: 67  Resp: 18  Temp: 98.2 F (36.8 C)  SpO2: 97%   Filed Weights   11/21/22 1421  Weight: 294 lb (133.4 kg)    Physical Exam Constitutional:      General: He is not in acute distress. HENT:     Head: Normocephalic and atraumatic.  Eyes:     General: No scleral icterus. Cardiovascular:     Rate and Rhythm: Normal rate.  Pulmonary:     Effort: Pulmonary effort is normal. No respiratory distress.     Breath sounds: No  wheezing.  Abdominal:     General: Bowel sounds are normal. There is no distension.  Musculoskeletal:        General: Normal range of motion.     Cervical back: Normal range of motion and neck supple.     Right lower leg: No edema.     Comments: Bilateral chronic lower extremity swelling, varicose veins  Skin:    General: Skin is warm and dry.     Findings: No erythema or rash.  Neurological:     Mental Status: He is alert and oriented to person, place, and time. Mental status is at baseline.     Cranial Nerves: No cranial nerve deficit.  Psychiatric:        Mood and Affect: Mood normal.     LABORATORY DATA:  I have reviewed the data as listed    Latest Ref Rng & Units 11/21/2022    2:09 PM 11/08/2022    8:03 AM 10/31/2022    1:00 PM  CBC  WBC 4.0 - 10.5 K/uL 5.8  4.9  5.3   Hemoglobin 13.0 - 17.0 g/dL 13.8  15.4  14.9   Hematocrit 39.0 - 52.0 % 42.9  45.3  43.6   Platelets 150 - 400 K/uL 170  299  202       Latest Ref Rng & Units 11/21/2022    2:09 PM 11/08/2022    8:03 AM 10/31/2022    1:00 PM  CMP  Glucose 70 - 99 mg/dL 363  114  217   BUN 8 - 23 mg/dL '12  9  8   '$ Creatinine 0.61 - 1.24 mg/dL 0.83  0.79  0.77   Sodium 135 - 145 mmol/L 135  135  137   Potassium 3.5 - 5.1 mmol/L 5.2  4.7  4.4   Chloride 98 - 111 mmol/L 98  97  99   CO2 22 - 32 mmol/L '26  29  28   '$ Calcium 8.9 - 10.3 mg/dL 8.6  9.1  8.3   Total Protein 6.5 - 8.1 g/dL  6.3  6.0  5.9   Total Bilirubin 0.3 - 1.2 mg/dL 0.2  0.3  0.5   Alkaline Phos 38 - 126 U/L 62  68  64   AST 15 - 41 U/L '22  26  22   '$ ALT 0 - 44 U/L 26  38  33     RADIOGRAPHIC STUDIES: I have personally reviewed the radiological images as listed and agreed with the findings in the report. CT Chest Wo Contrast  Result Date: 10/30/2022 CLINICAL DATA:  Follow-up pneumonia EXAM: CT CHEST WITHOUT CONTRAST TECHNIQUE: Multidetector CT imaging of the chest was performed following the standard protocol without IV contrast. RADIATION DOSE REDUCTION:  This exam was performed according to the departmental dose-optimization program which includes automated exposure control, adjustment of the mA and/or kV according to patient size and/or use of iterative reconstruction technique. COMPARISON:  CT 10/12/2022, 09/15/2022, PET CT 09/12/2022, chest CT 08/16/2022 FINDINGS: Cardiovascular: Limited evaluation without intravenous contrast. Mild aortic atherosclerosis. No aneurysm. Enlarged pulmonary trunk up to 3.9 cm. Coronary vascular calcification. Normal cardiac size. No pericardial effusion Mediastinum/Nodes: Midline trachea. Enlarged heterogeneous left lobe of thyroid, This has been evaluated on previous imaging. (ref: J Am Coll Radiol. 2015 Feb;12(2): 143-50). Subcentimeter mediastinal lymph nodes. Esophagus within normal limits. Lungs/Pleura: Bilateral heterogeneous ground-glass densities, greatest within the right middle lobe and bilateral lower lobes appears slightly worse as compared with CT from 12/15. No pleural effusion or pneumothorax. Upper Abdomen: Hypodense liver mass. Complex cystic lesion upper pole right kidney with calcification, this was previously evaluated with MRI, no specific imaging follow-up is recommended. Musculoskeletal: No acute osseous abnormality IMPRESSION: 1. Heterogeneous bilateral ground-glass disease, multilobar but worst in the right middle lobe, lingula and lower lobes. There is progression of the abnormality since 10/12/2022. Though pneumonia could produce this appearance, given persistence and fluctuating appearance since October, consider alternative entities such as hypersensitivity pneumonitis or idiopathic interstitial pneumonia. 2. Slightly enlarged pulmonary trunk, possible arterial hypertension 3. Redemonstrated large hypodense right hepatic lobe mass. Aortic Atherosclerosis (ICD10-I70.0). Electronically Signed   By: Donavan Foil M.D.   On: 10/30/2022 21:49

## 2022-11-21 NOTE — Assessment & Plan Note (Addendum)
Imaging findings and pathology results were reviewed with patient.  Clinical diagnosis is cholangiocarcinoma, although atypical with both normal CEA and CA 19.9 Labs are reviewed and discussed with patient. S/p 2 cycles  Gemcitabine, Cisplatin and Durvalumab.  Overall he tolerates very well.  Proceed with cycle 3 D1 Cisplatin Gemcitabine Durvalumab, he will return in 1 week of D8 Gemcitabine and Cisplatin treatment.  Repeat PET

## 2022-11-21 NOTE — Assessment & Plan Note (Signed)
- 

## 2022-11-21 NOTE — Progress Notes (Signed)
Pt here for follow up. Reports he has a mild cough /congestion.

## 2022-11-22 ENCOUNTER — Inpatient Hospital Stay: Payer: BC Managed Care – PPO

## 2022-11-22 ENCOUNTER — Telehealth: Payer: Self-pay

## 2022-11-22 VITALS — BP 144/60 | HR 80 | Temp 96.9°F | Resp 18

## 2022-11-22 DIAGNOSIS — D6861 Antiphospholipid syndrome: Secondary | ICD-10-CM | POA: Diagnosis not present

## 2022-11-22 DIAGNOSIS — Z5112 Encounter for antineoplastic immunotherapy: Secondary | ICD-10-CM | POA: Diagnosis not present

## 2022-11-22 DIAGNOSIS — E875 Hyperkalemia: Secondary | ICD-10-CM | POA: Diagnosis not present

## 2022-11-22 DIAGNOSIS — Z79899 Other long term (current) drug therapy: Secondary | ICD-10-CM | POA: Diagnosis not present

## 2022-11-22 DIAGNOSIS — C221 Intrahepatic bile duct carcinoma: Secondary | ICD-10-CM

## 2022-11-22 DIAGNOSIS — Z86718 Personal history of other venous thrombosis and embolism: Secondary | ICD-10-CM | POA: Diagnosis not present

## 2022-11-22 DIAGNOSIS — R918 Other nonspecific abnormal finding of lung field: Secondary | ICD-10-CM | POA: Diagnosis not present

## 2022-11-22 DIAGNOSIS — Z7901 Long term (current) use of anticoagulants: Secondary | ICD-10-CM | POA: Diagnosis not present

## 2022-11-22 DIAGNOSIS — Z5111 Encounter for antineoplastic chemotherapy: Secondary | ICD-10-CM | POA: Diagnosis not present

## 2022-11-22 DIAGNOSIS — J310 Chronic rhinitis: Secondary | ICD-10-CM | POA: Diagnosis not present

## 2022-11-22 MED ORDER — POTASSIUM CHLORIDE IN NACL 20-0.9 MEQ/L-% IV SOLN
Freq: Once | INTRAVENOUS | Status: AC
  Filled 2022-11-22: qty 1000

## 2022-11-22 MED ORDER — SODIUM CHLORIDE 0.9 % IV SOLN
25.0000 mg/m2 | Freq: Once | INTRAVENOUS | Status: AC
  Administered 2022-11-22: 66 mg via INTRAVENOUS
  Filled 2022-11-22: qty 66

## 2022-11-22 MED ORDER — PALONOSETRON HCL INJECTION 0.25 MG/5ML
0.2500 mg | Freq: Once | INTRAVENOUS | Status: AC
  Administered 2022-11-22: 0.25 mg via INTRAVENOUS
  Filled 2022-11-22: qty 5

## 2022-11-22 MED ORDER — SODIUM CHLORIDE 0.9 % IV SOLN
10.0000 mg/kg | Freq: Once | INTRAVENOUS | Status: AC
  Administered 2022-11-22: 1360 mg via INTRAVENOUS
  Filled 2022-11-22: qty 27.2

## 2022-11-22 MED ORDER — SODIUM CHLORIDE 0.9 % IV SOLN
150.0000 mg | Freq: Once | INTRAVENOUS | Status: AC
  Administered 2022-11-22: 150 mg via INTRAVENOUS
  Filled 2022-11-22: qty 150

## 2022-11-22 MED ORDER — MAGNESIUM SULFATE 2 GM/50ML IV SOLN
2.0000 g | Freq: Once | INTRAVENOUS | Status: AC
  Administered 2022-11-22: 2 g via INTRAVENOUS
  Filled 2022-11-22: qty 50

## 2022-11-22 MED ORDER — SODIUM CHLORIDE 0.9 % IV SOLN
1000.0000 mg/m2 | Freq: Once | INTRAVENOUS | Status: AC
  Administered 2022-11-22: 2662 mg via INTRAVENOUS
  Filled 2022-11-22: qty 52.6

## 2022-11-22 MED ORDER — SODIUM CHLORIDE 0.9 % IV SOLN
Freq: Once | INTRAVENOUS | Status: AC
  Filled 2022-11-22: qty 250

## 2022-11-22 MED ORDER — SODIUM CHLORIDE 0.9 % IV SOLN
10.0000 mg | Freq: Once | INTRAVENOUS | Status: AC
  Administered 2022-11-22: 10 mg via INTRAVENOUS
  Filled 2022-11-22: qty 10

## 2022-11-22 NOTE — Telephone Encounter (Signed)
-----  Message from Secundino Ginger sent at 11/22/2022  7:41 AM EST ----- Regarding: PRIOR AUTHORIZATION - [facility] PRIOR AUTHORIZATION - [facility] sent to his chart.for Alzelastine -Fluticasone.

## 2022-11-22 NOTE — Progress Notes (Signed)
Left a voicemail for patient to call me back regarding Gerlene Fee, and any questions he may have regarding medical bills and his deductible.

## 2022-11-22 NOTE — Telephone Encounter (Signed)
PA for azelastine-Fluticasone 137-50 MCG/ACT suspension has been submitted via covermymeds portal.    Key : [id number]

## 2022-11-22 NOTE — Patient Instructions (Signed)
[facility]  Discharge Instructions: Thank you for choosing [facility] to provide your oncology and hematology care.  If you have a lab appointment with the [facility], please go directly to the [facility] and check in at the registration area.  Wear comfortable clothing and clothing appropriate for easy access to any Portacath or PICC line.   We strive to give you quality time with your provider. You may need to reschedule your appointment if you arrive late (15 or more minutes).  Arriving late affects you and other patients whose appointments are after yours.  Also, if you miss three or more appointments without notifying the office, you may be dismissed from the clinic at the provider's discretion.      For prescription refill requests, have your pharmacy contact our office and allow 72 hours for refills to be completed.    Today you received the following chemotherapy and/or immunotherapy agents IMFINZI, GEMZAR, CISPLATIN      To help prevent nausea and vomiting after your treatment, we encourage you to take your nausea medication as directed.  BELOW ARE SYMPTOMS THAT SHOULD BE REPORTED IMMEDIATELY: *FEVER GREATER THAN 100.4 F (38 C) OR HIGHER *CHILLS OR SWEATING *NAUSEA AND VOMITING THAT IS NOT CONTROLLED WITH YOUR NAUSEA MEDICATION *UNUSUAL SHORTNESS OF BREATH *UNUSUAL BRUISING OR BLEEDING *URINARY PROBLEMS (pain or burning when urinating, or frequent urination) *BOWEL PROBLEMS (unusual diarrhea, constipation, pain near the anus) TENDERNESS IN MOUTH AND THROAT WITH OR WITHOUT PRESENCE OF ULCERS (sore throat, sores in mouth, or a toothache) UNUSUAL RASH, SWELLING OR PAIN  UNUSUAL VAGINAL DISCHARGE OR ITCHING   Items with * indicate a potential emergency and should be followed up as soon as possible or go to the Emergency Department if any problems should occur.  Please show the CHEMOTHERAPY ALERT CARD or IMMUNOTHERAPY ALERT  CARD at check-in to the Emergency Department and triage nurse.  Should you have questions after your visit or need to cancel or reschedule your appointment, please contact [facility]  [phone] and follow the prompts.  Office hours are 8:00 a.m. to 4:30 p.m. Monday - Friday. Please note that voicemails left after 4:00 p.m. may not be returned until the following business day.  We are closed weekends and major holidays. You have access to a nurse at all times for urgent questions. Please call the main number to the clinic [phone] and follow the prompts.  For any non-urgent questions, you may also contact your provider using MyChart. We now offer e-Visits for anyone 68 and older to request care online for non-urgent symptoms. For details visit [email or web address].   Also download the MyChart app! Go to the app store, search "MyChart", open the app, select [facility], and log in with your MyChart username and password.  Durvalumab Injection What is this medication? DURVALUMAB (dur VAL ue mab) treats some types of cancer. It works by helping your immune system slow or stop the spread of cancer cells. It is a monoclonal antibody. This medicine may be used for other purposes; ask your health care provider or pharmacist if you have questions. COMMON BRAND NAME(S): IMFINZI What should I tell my care team before I take this medication? They need to know if you have any of these conditions: Allogeneic stem cell transplant (uses someone else's stem cells) Autoimmune diseases, such as Crohn disease, ulcerative colitis, lupus History of chest radiation Nervous system problems, such as Guillain-Barre syndrome, myasthenia  gravis Organ transplant An unusual or allergic reaction to durvalumab, other medications, foods, dyes, or preservatives Pregnant or trying to get pregnant Breast-feeding How should I use this medication? This medication is infused into a  vein. It is given by your care team in a hospital or clinic setting. A special MedGuide will be given to you before each treatment. Be sure to read this information carefully each time. Talk to your care team about the use of this medication in children. Special care may be needed. Overdosage: If you think you have taken too much of this medicine contact a poison control center or emergency room at once. NOTE: This medicine is only for you. Do not share this medicine with others. What if I miss a dose? Keep appointments for follow-up doses. It is important not to miss your dose. Call your care team if you are unable to keep an appointment. What may interact with this medication? Interactions have not been studied. This list may not describe all possible interactions. Give your health care provider a list of all the medicines, herbs, non-prescription drugs, or dietary supplements you use. Also tell them if you smoke, drink alcohol, or use illegal drugs. Some items may interact with your medicine. What should I watch for while using this medication? Your condition will be monitored carefully while you are receiving this medication. You may need blood work while taking this medication. This medication may cause serious skin reactions. They can happen weeks to months after starting the medication. Contact your care team right away if you notice fevers or flu-like symptoms with a rash. The rash may be red or purple and then turn into blisters or peeling of the skin. You may also notice a red rash with swelling of the face, lips, or lymph nodes in your neck or under your arms. Tell your care team right away if you have any change in your eyesight. Talk to your care team if you may be pregnant. Serious birth defects can occur if you take this medication during pregnancy and for 3 months after the last dose. You will need a negative pregnancy test before starting this medication. Contraception is recommended  while taking this medication and for 3 months after the last dose. Your care team can help you find the option that works for you. Do not breastfeed while taking this medication and for 3 months after the last dose. What side effects may I notice from receiving this medication? Side effects that you should report to your care team as soon as possible: Allergic reactions--skin rash, itching, hives, swelling of the face, lips, tongue, or throat Dry cough, shortness of breath or trouble breathing Eye pain, redness, irritation, or discharge with blurry or decreased vision Heart muscle inflammation--unusual weakness or fatigue, shortness of breath, chest pain, fast or irregular heartbeat, dizziness, swelling of the ankles, feet, or hands Hormone gland problems--headache, sensitivity to light, unusual weakness or fatigue, dizziness, fast or irregular heartbeat, increased sensitivity to cold or heat, excessive sweating, constipation, hair loss, increased thirst or amount of urine, tremors or shaking, irritability Infusion reactions--chest pain, shortness of breath or trouble breathing, feeling faint or lightheaded Kidney injury (glomerulonephritis)--decrease in the amount of urine, red or dark brown urine, foamy or bubbly urine, swelling of the ankles, hands, or feet Liver injury--right upper belly pain, loss of appetite, nausea, light-colored stool, dark yellow or brown urine, yellowing skin or eyes, unusual weakness or fatigue Pain, tingling, or numbness in the hands or feet, muscle  weakness, change in vision, confusion or trouble speaking, loss of balance or coordination, trouble walking, seizures Rash, fever, and swollen lymph nodes Redness, blistering, peeling, or loosening of the skin, including inside the mouth Sudden or severe stomach pain, bloody diarrhea, fever, nausea, vomiting Side effects that usually do not require medical attention (report these to your care team if they continue or are  bothersome): Bone, joint, or muscle pain Diarrhea Fatigue Loss of appetite Nausea Skin rash This list may not describe all possible side effects. Call your doctor for medical advice about side effects. You may report side effects to FDA at 1-800-FDA-1088. Where should I keep my medication? This medication is given in a hospital or clinic. It will not be stored at home. NOTE: This sheet is a summary. It may not cover all possible information. If you have questions about this medicine, talk to your doctor, pharmacist, or health care provider.  2023 Elsevier/Gold Standard (2022-02-05 00:00:00).  Gemcitabine Injection What is this medication? GEMCITABINE (jem SYE ta been) treats some types of cancer. It works by slowing down the growth of cancer cells. This medicine may be used for other purposes; ask your health care provider or pharmacist if you have questions. COMMON BRAND NAME(S): Gemzar, Infugem What should I tell my care team before I take this medication? They need to know if you have any of these conditions: Blood disorders Infection Kidney disease Liver disease Lung or breathing disease, such as asthma or COPD Recent or ongoing radiation therapy An unusual or allergic reaction to gemcitabine, other medications, foods, dyes, or preservatives If you or your partner are pregnant or trying to get pregnant Breast-feeding How should I use this medication? This medication is injected into a vein. It is given by your care team in a hospital or clinic setting. Talk to your care team about the use of this medication in children. Special care may be needed. Overdosage: If you think you have taken too much of this medicine contact a poison control center or emergency room at once. NOTE: This medicine is only for you. Do not share this medicine with others. What if I miss a dose? Keep appointments for follow-up doses. It is important not to miss your dose. Call your care team if you are  unable to keep an appointment. What may interact with this medication? Interactions have not been studied. This list may not describe all possible interactions. Give your health care provider a list of all the medicines, herbs, non-prescription drugs, or dietary supplements you use. Also tell them if you smoke, drink alcohol, or use illegal drugs. Some items may interact with your medicine. What should I watch for while using this medication? Your condition will be monitored carefully while you are receiving this medication. This medication may make you feel generally unwell. This is not uncommon, as chemotherapy can affect healthy cells as well as cancer cells. Report any side effects. Continue your course of treatment even though you feel ill unless your care team tells you to stop. In some cases, you may be given additional medications to help with side effects. Follow all directions for their use. This medication may increase your risk of getting an infection. Call your care team for advice if you get a fever, chills, sore throat, or other symptoms of a cold or flu. Do not treat yourself. Try to avoid being around people who are sick. This medication may increase your risk to bruise or bleed. Call your care team if you  notice any unusual bleeding. Be careful brushing or flossing your teeth or using a toothpick because you may get an infection or bleed more easily. If you have any dental work done, tell your dentist you are receiving this medication. Avoid taking medications that contain aspirin, acetaminophen, ibuprofen, naproxen, or ketoprofen unless instructed by your care team. These medications may hide a fever. Talk to your care team if you or your partner wish to become pregnant or think you might be pregnant. This medication can cause serious birth defects if taken during pregnancy and for 6 months after the last dose. A negative pregnancy test is required before starting this medication. A  reliable form of contraception is recommended while taking this medication and for 6 months after the last dose. Talk to your care team about effective forms of contraception. Do not father a child while taking this medication and for 3 months after the last dose. Use a condom while having sex during this time period. Do not breastfeed while taking this medication and for at least 1 week after the last dose. This medication may cause infertility. Talk to your care team if you are concerned about your fertility. What side effects may I notice from receiving this medication? Side effects that you should report to your care team as soon as possible: Allergic reactions--skin rash, itching, hives, swelling of the face, lips, tongue, or throat Capillary leak syndrome--stomach or muscle pain, unusual weakness or fatigue, feeling faint or lightheaded, decrease in the amount of urine, swelling of the ankles, hands, or feet, trouble breathing Infection--fever, chills, cough, sore throat, wounds that don't heal, pain or trouble when passing urine, general feeling of discomfort or being unwell Liver injury--right upper belly pain, loss of appetite, nausea, light-colored stool, dark yellow or brown urine, yellowing skin or eyes, unusual weakness or fatigue Low red blood cell level--unusual weakness or fatigue, dizziness, headache, trouble breathing Lung injury--shortness of breath or trouble breathing, cough, spitting up blood, chest pain, fever Stomach pain, bloody diarrhea, pale skin, unusual weakness or fatigue, decrease in the amount of urine, which may be signs of hemolytic uremic syndrome Sudden and severe headache, confusion, change in vision, seizures, which may be signs of posterior reversible encephalopCisplatin Injection What is this medication? CISPLATIN (SIS pla tin) treats some types of cancer. It works by slowing down the growth of cancer cells. This medicine may be used for other purposes; ask your  health care provider or pharmacist if you have questions. COMMON BRAND NAME(S): Platinol, Platinol -AQ What should I tell my care team before I take this medication? They need to know if you have any of these conditions: Eye disease, vision problems Hearing problems Kidney disease Low blood counts, such as low white cells, platelets, or red blood cells Tingling of the fingers or toes, or other nerve disorder An unusual or allergic reaction to cisplatin, carboplatin, oxaliplatin, other medications, foods, dyes, or preservatives If you or your partner are pregnant or trying to get pregnant Breast-feeding How should I use this medication? This medication is injected into a vein. It is given by your care team in a hospital or clinic setting. Talk to your care team about the use of this medication in children. Special care may be needed. Overdosage: If you think you have taken too much of this medicine contact a poison control center or emergency room at once. NOTE: This medicine is only for you. Do not share this medicine with others. What if I miss a dose?  Keep appointments for follow-up doses. It is important not to miss your dose. Call your care team if you are unable to keep an appointment. What may interact with this medication? Do not take this medication with any of the following: Live virus vaccines This medication may also interact with the following: Certain antibiotics, such as amikacin, gentamicin, neomycin, polymyxin B, streptomycin, tobramycin, vancomycin Foscarnet This list may not describe all possible interactions. Give your health care provider a list of all the medicines, herbs, non-prescription drugs, or dietary supplements you use. Also tell them if you smoke, drink alcohol, or use illegal drugs. Some items may interact with your medicine. What should I watch for while using this medication? Your condition will be monitored carefully while you are receiving this  medication. You may need blood work done while taking this medication. This medication may make you feel generally unwell. This is not uncommon, as chemotherapy can affect healthy cells as well as cancer cells. Report any side effects. Continue your course of treatment even though you feel ill unless your care team tells you to stop. This medication may increase your risk of getting an infection. Call your care team for advice if you get a fever, chills, sore throat, or other symptoms of a cold or flu. Do not treat yourself. Try to avoid being around people who are sick. Avoid taking medications that contain aspirin, acetaminophen, ibuprofen, naproxen, or ketoprofen unless instructed by your care team. These medications may hide a fever. This medication may increase your risk to bruise or bleed. Call your care team if you notice any unusual bleeding. Be careful brushing or flossing your teeth or using a toothpick because you may get an infection or bleed more easily. If you have any dental work done, tell your dentist you are receiving this medication. Drink fluids as directed while you are taking this medication. This will help protect your kidneys. Call your care team if you get diarrhea. Do not treat yourself. Talk to your care team if you or your partner wish to become pregnant or think you might be pregnant. This medication can cause serious birth defects if taken during pregnancy and for 14 months after the last dose. A negative pregnancy test is required before starting this medication. A reliable form of contraception is recommended while taking this medication and for 14 months after the last dose. Talk to your care team about effective forms of contraception. Do not father a child while taking this medication and for 11 months after the last dose. Use a condom during sex during this time period. Do not breast-feed while taking this medication. This medication may cause infertility. Talk to your  care team if you are concerned about your fertility. What side effects may I notice from receiving this medication? Side effects that you should report to your care team as soon as possible: Allergic reactions--skin rash, itching, hives, swelling of the face, lips, tongue, or throat Eye pain, change in vision, vision loss Hearing loss, ringing in ears Infection--fever, chills, cough, sore throat, wounds that don't heal, pain or trouble when passing urine, general feeling of discomfort or being unwell Kidney injury--decrease in the amount of urine, swelling of the ankles, hands, or feet Low red blood cell level--unusual weakness or fatigue, dizziness, headache, trouble breathing Painful swelling, warmth, or redness of the skin, blisters or sores at the infusion site Pain, tingling, or numbness in the hands or feet Unusual bruising or bleeding Side effects that usually do not  require medical attention (report to your care team if they continue or are bothersome): Hair loss Nausea Vomiting This list may not describe all possible side effects. Call your doctor for medical advice about side effects. You may report side effects to FDA at 1-800-FDA-1088. Where should I keep my medication? This medication is given in a hospital or clinic. It will not be stored at home. NOTE: This sheet is a summary. It may not cover all possible information. If you have questions about this medicine, talk to your doctor, pharmacist, or health care provider.  2023 Elsevier/Gold Standard (2022-02-09 00:00:00) athy syndrome (PRES) Unusual bruising or bleeding Side effects that usually do not require medical attention (report to your care team if they continue or are bothersome): Diarrhea Drowsiness Hair loss Nausea Pain, redness, or swelling with sores inside the mouth or throat Vomiting This list may not describe all possible side effects. Call your doctor for medical advice about side effects. You may report  side effects to FDA at 1-800-FDA-1088. Where should I keep my medication? This medication is given in a hospital or clinic. It will not be stored at home. NOTE: This sheet is a summary. It may not cover all possible information. If you have questions about this medicine, talk to your doctor, pharmacist, or health care provider.  2023 Elsevier/Gold Standard (2007-12-06 00:00:00)

## 2022-11-27 ENCOUNTER — Encounter: Payer: Self-pay | Admitting: Oncology

## 2022-11-27 ENCOUNTER — Encounter: Payer: Self-pay | Admitting: Licensed Clinical Social Worker

## 2022-11-27 NOTE — Progress Notes (Signed)
[facility] CSW Progress Note  Clinical Education officer, museum contacted patient by phone to discuss financial assistance. Patient stated he was concerned about the financial impact from the bills and inquired about supplemental insurance.  CSW recommended the patient Google information on supplemental insurance. CSW will also update Mort Sawyers in reference to the patient wanting to speak with her. Patient stated he would contact CSW if he had additional questions or concerns    Adelene Amas, LCSW

## 2022-11-27 NOTE — Telephone Encounter (Signed)
PA denied.

## 2022-11-27 NOTE — Progress Notes (Signed)
Referred patient to [facility] for co-pay assistance with IV Chemo. He will not qualify for the Gerlene Fee at this time due to income.

## 2022-11-27 NOTE — Progress Notes (Signed)
Called to speak with patient regarding the Gerlene Fee and any other questions that he may have, reached his voicemail, left him a message asking that he return my call.

## 2022-11-29 ENCOUNTER — Inpatient Hospital Stay: Payer: BC Managed Care – PPO | Attending: Oncology

## 2022-11-29 ENCOUNTER — Inpatient Hospital Stay: Payer: BC Managed Care – PPO

## 2022-11-29 ENCOUNTER — Other Ambulatory Visit: Payer: Self-pay | Admitting: Oncology

## 2022-11-29 VITALS — BP 159/97 | HR 75 | Temp 97.2°F | Resp 18 | Ht 75.0 in | Wt 291.6 lb

## 2022-11-29 DIAGNOSIS — Z7901 Long term (current) use of anticoagulants: Secondary | ICD-10-CM | POA: Insufficient documentation

## 2022-11-29 DIAGNOSIS — C221 Intrahepatic bile duct carcinoma: Secondary | ICD-10-CM | POA: Insufficient documentation

## 2022-11-29 DIAGNOSIS — J310 Chronic rhinitis: Secondary | ICD-10-CM | POA: Insufficient documentation

## 2022-11-29 DIAGNOSIS — R918 Other nonspecific abnormal finding of lung field: Secondary | ICD-10-CM | POA: Diagnosis not present

## 2022-11-29 DIAGNOSIS — Z5112 Encounter for antineoplastic immunotherapy: Secondary | ICD-10-CM | POA: Diagnosis not present

## 2022-11-29 DIAGNOSIS — Z79899 Other long term (current) drug therapy: Secondary | ICD-10-CM | POA: Diagnosis not present

## 2022-11-29 DIAGNOSIS — C7951 Secondary malignant neoplasm of bone: Secondary | ICD-10-CM | POA: Insufficient documentation

## 2022-11-29 DIAGNOSIS — D6861 Antiphospholipid syndrome: Secondary | ICD-10-CM | POA: Diagnosis not present

## 2022-11-29 DIAGNOSIS — M79602 Pain in left arm: Secondary | ICD-10-CM | POA: Diagnosis not present

## 2022-11-29 DIAGNOSIS — Z79620 Long term (current) use of immunosuppressive biologic: Secondary | ICD-10-CM | POA: Diagnosis not present

## 2022-11-29 DIAGNOSIS — Z5111 Encounter for antineoplastic chemotherapy: Secondary | ICD-10-CM | POA: Insufficient documentation

## 2022-11-29 LAB — COMPREHENSIVE METABOLIC PANEL
ALT: 32 U/L (ref 0–44)
AST: 23 U/L (ref 15–41)
Albumin: 3.4 g/dL — ABNORMAL LOW (ref 3.5–5.0)
Alkaline Phosphatase: 64 U/L (ref 38–126)
Anion gap: 11 (ref 5–15)
BUN: 7 mg/dL — ABNORMAL LOW (ref 8–23)
CO2: 29 mmol/L (ref 22–32)
Calcium: 9.1 mg/dL (ref 8.9–10.3)
Chloride: 98 mmol/L (ref 98–111)
Creatinine, Ser: 0.74 mg/dL (ref 0.61–1.24)
GFR, Estimated: >60 mL/min (ref >60)
Glucose, Bld: 160 mg/dL — ABNORMAL HIGH (ref 70–99)
Potassium: 5.1 mmol/L (ref 3.5–5.1)
Sodium: 138 mmol/L (ref 135–145)
Total Bilirubin: 0.4 mg/dL (ref 0.3–1.2)
Total Protein: 6 g/dL — ABNORMAL LOW (ref 6.5–8.1)

## 2022-11-29 LAB — CBC WITH DIFFERENTIAL/PLATELET
Abs Immature Granulocytes: 0.28 10*3/uL — ABNORMAL HIGH (ref 0.00–0.07)
Basophils Absolute: 0.1 10*3/uL (ref 0.0–0.1)
Basophils Relative: 1 %
Eosinophils Absolute: 0 10*3/uL (ref 0.0–0.5)
Eosinophils Relative: 1 %
HCT: 39.6 % (ref 39.0–52.0)
Hemoglobin: 13.3 g/dL (ref 13.0–17.0)
Immature Granulocytes: 8 %
Lymphocytes Relative: 13 %
Lymphs Abs: 0.5 10*3/uL — ABNORMAL LOW (ref 0.7–4.0)
MCH: 30.6 pg (ref 26.0–34.0)
MCHC: 33.6 g/dL (ref 30.0–36.0)
MCV: 91.2 fL (ref 80.0–100.0)
Monocytes Absolute: 0.4 10*3/uL (ref 0.1–1.0)
Monocytes Relative: 12 %
Neutro Abs: 2.4 10*3/uL (ref 1.7–7.7)
Neutrophils Relative %: 65 %
Platelets: 210 10*3/uL (ref 150–400)
RBC: 4.34 MIL/uL (ref 4.22–5.81)
RDW: 13.3 % (ref 11.5–15.5)
Smear Review: NORMAL
WBC: 3.7 10*3/uL — ABNORMAL LOW (ref 4.0–10.5)
nRBC: 0.8 % — ABNORMAL HIGH (ref 0.0–0.2)

## 2022-11-29 LAB — MAGNESIUM: Magnesium: 1.6 mg/dL — ABNORMAL LOW (ref 1.7–2.4)

## 2022-11-29 MED ORDER — SODIUM CHLORIDE 0.9 % IV SOLN
25.0000 mg/m2 | Freq: Once | INTRAVENOUS | Status: AC
  Administered 2022-11-29: 66 mg via INTRAVENOUS
  Filled 2022-11-29: qty 66

## 2022-11-29 MED ORDER — SODIUM CHLORIDE 0.9 % IV SOLN
1000.0000 mg/m2 | Freq: Once | INTRAVENOUS | Status: AC
  Administered 2022-11-29: 2662 mg via INTRAVENOUS
  Filled 2022-11-29: qty 52.61

## 2022-11-29 MED ORDER — SODIUM CHLORIDE 0.9 % IV SOLN
10.0000 mg | Freq: Once | INTRAVENOUS | Status: AC
  Administered 2022-11-29: 10 mg via INTRAVENOUS
  Filled 2022-11-29: qty 1

## 2022-11-29 MED ORDER — SODIUM CHLORIDE 0.9 % IV SOLN
150.0000 mg | Freq: Once | INTRAVENOUS | Status: AC
  Administered 2022-11-29: 150 mg via INTRAVENOUS
  Filled 2022-11-29: qty 5

## 2022-11-29 MED ORDER — MAGNESIUM SULFATE 2 GM/50ML IV SOLN
2.0000 g | Freq: Once | INTRAVENOUS | Status: AC
  Administered 2022-11-29: 2 g via INTRAVENOUS
  Filled 2022-11-29: qty 50

## 2022-11-29 MED ORDER — PALONOSETRON HCL INJECTION 0.25 MG/5ML
0.2500 mg | Freq: Once | INTRAVENOUS | Status: AC
  Administered 2022-11-29: 0.25 mg via INTRAVENOUS
  Filled 2022-11-29: qty 5

## 2022-11-29 MED ORDER — POTASSIUM CHLORIDE IN NACL 20-0.9 MEQ/L-% IV SOLN
Freq: Once | INTRAVENOUS | Status: AC
  Filled 2022-11-29: qty 1000

## 2022-11-29 MED ORDER — SODIUM CHLORIDE 0.9 % IV SOLN
Freq: Once | INTRAVENOUS | Status: AC
  Filled 2022-11-29: qty 250

## 2022-11-29 NOTE — Patient Instructions (Signed)
[facility]  Discharge Instructions: Thank you for choosing [facility] to provide your oncology and hematology care.  If you have a lab appointment with the [facility], please go directly to the [facility] and check in at the registration area.  Wear comfortable clothing and clothing appropriate for easy access to any Portacath or PICC line.   We strive to give you quality time with your provider. You may need to reschedule your appointment if you arrive late (15 or more minutes).  Arriving late affects you and other patients whose appointments are after yours.  Also, if you miss three or more appointments without notifying the office, you may be dismissed from the clinic at the provider's discretion.      For prescription refill requests, have your pharmacy contact our office and allow 72 hours for refills to be completed.    Today you received the following chemotherapy and/or immunotherapy agents CISPLATIN, IMFINZI and GEMZAR      To help prevent nausea and vomiting after your treatment, we encourage you to take your nausea medication as directed.  BELOW ARE SYMPTOMS THAT SHOULD BE REPORTED IMMEDIATELY: *FEVER GREATER THAN 100.4 F (38 C) OR HIGHER *CHILLS OR SWEATING *NAUSEA AND VOMITING THAT IS NOT CONTROLLED WITH YOUR NAUSEA MEDICATION *UNUSUAL SHORTNESS OF BREATH *UNUSUAL BRUISING OR BLEEDING *URINARY PROBLEMS (pain or burning when urinating, or frequent urination) *BOWEL PROBLEMS (unusual diarrhea, constipation, pain near the anus) TENDERNESS IN MOUTH AND THROAT WITH OR WITHOUT PRESENCE OF ULCERS (sore throat, sores in mouth, or a toothache) UNUSUAL RASH, SWELLING OR PAIN  UNUSUAL VAGINAL DISCHARGE OR ITCHING   Items with * indicate a potential emergency and should be followed up as soon as possible or go to the Emergency Department if any problems should occur.  Please show the CHEMOTHERAPY ALERT CARD or IMMUNOTHERAPY ALERT  CARD at check-in to the Emergency Department and triage nurse.  Should you have questions after your visit or need to cancel or reschedule your appointment, please contact [facility]  [phone] and follow the prompts.  Office hours are 8:00 a.m. to 4:30 p.m. Monday - Friday. Please note that voicemails left after 4:00 p.m. may not be returned until the following business day.  We are closed weekends and major holidays. You have access to a nurse at all times for urgent questions. Please call the main number to the clinic [phone] and follow the prompts.  Cisplatin Injection What is this medication? CISPLATIN (SIS pla tin) treats some types of cancer. It works by slowing down the growth of cancer cells. This medicine may be used for other purposes; ask your health care provider or pharmacist if you have questions. COMMON BRAND NAME(S): Platinol, Platinol -AQ What should I tell my care team before I take this medication? They need to know if you have any of these conditions: Eye disease, vision problems Hearing problems Kidney disease Low blood counts, such as low white cells, platelets, or red blood cells Tingling of the fingers or toes, or other nerve disorder An unusual or allergic reaction to cisplatin, carboplatin, oxaliplatin, other medications, foods, dyes, or preservatives If you or your partner are pregnant or trying to get pregnant Breast-feeding How should I use this medication? This medication is injected into a vein. It is given by your care team in a hospital or clinic setting. Talk to your care team about the use of this medication in children. Special care  may be needed. Overdosage: If you think you have taken too much of this medicine contact a poison control center or emergency room at once. NOTE: This medicine is only for you. Do not share this medicine with others. What if I miss a dose? Keep appointments for follow-up doses. It is  important not to miss your dose. Call your care team if you are unable to keep an appointment. What may interact with this medication? Do not take this medication with any of the following: Live virus vaccines This medication may also interact with the following: Certain antibiotics, such as amikacin, gentamicin, neomycin, polymyxin B, streptomycin, tobramycin, vancomycin Foscarnet This list may not describe all possible interactions. Give your health care provider a list of all the medicines, herbs, non-prescription drugs, or dietary supplements you use. Also tell them if you smoke, drink alcohol, or use illegal drugs. Some items may interact with your medicine. What should I watch for while using this medication? Your condition will be monitored carefully while you are receiving this medication. You may need blood work done while taking this medication. This medication may make you feel generally unwell. This is not uncommon, as chemotherapy can affect healthy cells as well as cancer cells. Report any side effects. Continue your course of treatment even though you feel ill unless your care team tells you to stop. This medication may increase your risk of getting an infection. Call your care team for advice if you get a fever, chills, sore throat, or other symptoms of a cold or flu. Do not treat yourself. Try to avoid being around people who are sick. Avoid taking medications that contain aspirin, acetaminophen, ibuprofen, naproxen, or ketoprofen unless instructed by your care team. These medications may hide a fever. This medication may increase your risk to bruise or bleed. Call your care team if you notice any unusual bleeding. Be careful brushing or flossing your teeth or using a toothpick because you may get an infection or bleed more easily. If you have any dental work done, tell your dentist you are receiving this medication. Drink fluids as directed while you are taking this medication. This  will help protect your kidneys. Call your care team if you get diarrhea. Do not treat yourself. Talk to your care team if you or your partner wish to become pregnant or think you might be pregnant. This medication can cause serious birth defects if taken during pregnancy and for 14 months after the last dose. A negative pregnancy test is required before starting this medication. A reliable form of contraception is recommended while taking this medication and for 14 months after the last dose. Talk to your care team about effective forms of contraception. Do not father a child while taking this medication and for 11 months after the last dose. Use a condom during sex during this time period. Do not breast-feed while taking this medication. This medication may cause infertility. Talk to your care team if you are concerned about your fertility. What side effects may I notice from receiving this medication? Side effects that you should report to your care team as soon as possible: Allergic reactions--skin rash, itching, hives, swelling of the face, lips, tongue, or throat Eye pain, change in vision, vision loss Hearing loss, ringing in ears Infection--fever, chills, cough, sore throat, wounds that don't heal, pain or trouble when passing urine, general feeling of discomfort or being unwell Kidney injury--decrease in the amount of urine, swelling of the ankles, hands, or feet Low  red blood cell level--unusual weakness or fatigue, dizziness, headache, trouble breathing Painful swelling, warmth, or redness of the skin, blisters or sores at the infusion site Pain, tingling, or numbness in the hands or feet Unusual bruising or bleeding Side effects that usually do not require medical attention (report to your care team if they continue or are bothersome): Hair loss Nausea Vomiting This list may not describe all possible side effects. Call your doctor for medical advice about side effects. You may report  side effects to FDA at 1-800-FDA-1088. Where should I keep my medication? This medication is given in a hospital or clinic. It will not be stored at home. NOTE: This sheet is a summary. It may not cover all possible information. If you have questions about this medicine, talk to your doctor, pharmacist, or health care provider.  2023 Elsevier/Gold Standard (2022-02-09 00:00:00)   For any non-urgent questions, you may also contact your provider using MyChart. We now offer e-Visits for anyone 34 and older to request care online for non-urgent symptoms. For details visit [email or web address].   Also download the MyChart app! Go to the app store, search "MyChart", open the app, select [facility], and log in with your MyChart username and password.   Durvalumab Injection What is this medication? DURVALUMAB (dur VAL ue mab) treats some types of cancer. It works by helping your immune system slow or stop the spread of cancer cells. It is a monoclonal antibody. This medicine may be used for other purposes; ask your health care provider or pharmacist if you have questions. COMMON BRAND NAME(S): IMFINZI What should I tell my care team before I take this medication? They need to know if you have any of these conditions: Allogeneic stem cell transplant (uses someone else's stem cells) Autoimmune diseases, such as Crohn disease, ulcerative colitis, lupus History of chest radiation Nervous system problems, such as Guillain-Barre syndrome, myasthenia gravis Organ transplant An unusual or allergic reaction to durvalumab, other medications, foods, dyes, or preservatives Pregnant or trying to get pregnant Breast-feeding How should I use this medication? This medication is infused into a vein. It is given by your care team in a hospital or clinic setting. A special MedGuide will be given to you before each treatment. Be sure to read this information carefully each time. Talk to your care team about  the use of this medication in children. Special care may be needed. Overdosage: If you think you have taken too much of this medicine contact a poison control center or emergency room at once. NOTE: This medicine is only for you. Do not share this medicine with others. What if I miss a dose? Keep appointments for follow-up doses. It is important not to miss your dose. Call your care team if you are unable to keep an appointment. What may interact with this medication? Interactions have not been studied. This list may not describe all possible interactions. Give your health care provider a list of all the medicines, herbs, non-prescription drugs, or dietary supplements you use. Also tell them if you smoke, drink alcohol, or use illegal drugs. Some items may interact with your medicine. What should I watch for while using this medication? Your condition will be monitored carefully while you are receiving this medication. You may need blood work while taking this medication. This medication may cause serious skin reactions. They can happen weeks to months after starting the medication. Contact your care team right away if you notice fevers or flu-like symptoms with a rash.  The rash may be red or purple and then turn into blisters or peeling of the skin. You may also notice a red rash with swelling of the face, lips, or lymph nodes in your neck or under your arms. Tell your care team right away if you have any change in your eyesight. Talk to your care team if you may be pregnant. Serious birth defects can occur if you take this medication during pregnancy and for 3 months after the last dose. You will need a negative pregnancy test before starting this medication. Contraception is recommended while taking this medication and for 3 months after the last dose. Your care team can help you find the option that works for you. Do not breastfeed while taking this medication and for 3 months after the last  dose. What side effects may I notice from receiving this medication? Side effects that you should report to your care team as soon as possible: Allergic reactions--skin rash, itching, hives, swelling of the face, lips, tongue, or throat Dry cough, shortness of breath or trouble breathing Eye pain, redness, irritation, or discharge with blurry or decreased vision Heart muscle inflammation--unusual weakness or fatigue, shortness of breath, chest pain, fast or irregular heartbeat, dizziness, swelling of the ankles, feet, or hands Hormone gland problems--headache, sensitivity to light, unusual weakness or fatigue, dizziness, fast or irregular heartbeat, increased sensitivity to cold or heat, excessive sweating, constipation, hair loss, increased thirst or amount of urine, tremors or shaking, irritability Infusion reactions--chest pain, shortness of breath or trouble breathing, feeling faint or lightheaded Kidney injury (glomerulonephritis)--decrease in the amount of urine, red or dark brown urine, foamy or bubbly urine, swelling of the ankles, hands, or feet Liver injury--right upper belly pain, loss of appetite, nausea, light-colored stool, dark yellow or brown urine, yellowing skin or eyes, unusual weakness or fatigue Pain, tingling, or numbness in the hands or feet, muscle weakness, change in vision, confusion or trouble speaking, loss of balance or coordination, trouble walking, seizures Rash, fever, and swollen lymph nodes Redness, blistering, peeling, or loosening of the skin, including inside the mouth Sudden or severe stomach pain, bloody diarrhea, fever, nausea, vomiting Side effects that usually do not require medical attention (report these to your care team if they continue or are bothersome): Bone, joint, or muscle pain Diarrhea Fatigue Loss of appetite Nausea Skin rash This list may not describe all possible side effects. Call your doctor for medical advice about side effects. You may  report side effects to FDA at 1-800-FDA-1088. Where should I keep my medication? This medication is given in a hospital or clinic. It will not be stored at home. NOTE: This sheet is a summary. It may not cover all possible information. If you have questions about this medicine, talk to your doctor, pharmacist, or health care provider.  2023 Elsevier/Gold Standard (2022-02-05 00:00:00)  Gemcitabine Injection What is this medication? GEMCITABINE (jem SYE ta been) treats some types of cancer. It works by slowing down the growth of cancer cells. This medicine may be used for other purposes; ask your health care provider or pharmacist if you have questions. COMMON BRAND NAME(S): Gemzar, Infugem What should I tell my care team before I take this medication? They need to know if you have any of these conditions: Blood disorders Infection Kidney disease Liver disease Lung or breathing disease, such as asthma or COPD Recent or ongoing radiation therapy An unusual or allergic reaction to gemcitabine, other medications, foods, dyes, or preservatives If you or your  partner are pregnant or trying to get pregnant Breast-feeding How should I use this medication? This medication is injected into a vein. It is given by your care team in a hospital or clinic setting. Talk to your care team about the use of this medication in children. Special care may be needed. Overdosage: If you think you have taken too much of this medicine contact a poison control center or emergency room at once. NOTE: This medicine is only for you. Do not share this medicine with others. What if I miss a dose? Keep appointments for follow-up doses. It is important not to miss your dose. Call your care team if you are unable to keep an appointment. What may interact with this medication? Interactions have not been studied. This list may not describe all possible interactions. Give your health care provider a list of all the  medicines, herbs, non-prescription drugs, or dietary supplements you use. Also tell them if you smoke, drink alcohol, or use illegal drugs. Some items may interact with your medicine. What should I watch for while using this medication? Your condition will be monitored carefully while you are receiving this medication. This medication may make you feel generally unwell. This is not uncommon, as chemotherapy can affect healthy cells as well as cancer cells. Report any side effects. Continue your course of treatment even though you feel ill unless your care team tells you to stop. In some cases, you may be given additional medications to help with side effects. Follow all directions for their use. This medication may increase your risk of getting an infection. Call your care team for advice if you get a fever, chills, sore throat, or other symptoms of a cold or flu. Do not treat yourself. Try to avoid being around people who are sick. This medication may increase your risk to bruise or bleed. Call your care team if you notice any unusual bleeding. Be careful brushing or flossing your teeth or using a toothpick because you may get an infection or bleed more easily. If you have any dental work done, tell your dentist you are receiving this medication. Avoid taking medications that contain aspirin, acetaminophen, ibuprofen, naproxen, or ketoprofen unless instructed by your care team. These medications may hide a fever. Talk to your care team if you or your partner wish to become pregnant or think you might be pregnant. This medication can cause serious birth defects if taken during pregnancy and for 6 months after the last dose. A negative pregnancy test is required before starting this medication. A reliable form of contraception is recommended while taking this medication and for 6 months after the last dose. Talk to your care team about effective forms of contraception. Do not father a child while taking this  medication and for 3 months after the last dose. Use a condom while having sex during this time period. Do not breastfeed while taking this medication and for at least 1 week after the last dose. This medication may cause infertility. Talk to your care team if you are concerned about your fertility. What side effects may I notice from receiving this medication? Side effects that you should report to your care team as soon as possible: Allergic reactions--skin rash, itching, hives, swelling of the face, lips, tongue, or throat Capillary leak syndrome--stomach or muscle pain, unusual weakness or fatigue, feeling faint or lightheaded, decrease in the amount of urine, swelling of the ankles, hands, or feet, trouble breathing Infection--fever, chills, cough, sore throat, wounds  that don't heal, pain or trouble when passing urine, general feeling of discomfort or being unwell Liver injury--right upper belly pain, loss of appetite, nausea, light-colored stool, dark yellow or brown urine, yellowing skin or eyes, unusual weakness or fatigue Low red blood cell level--unusual weakness or fatigue, dizziness, headache, trouble breathing Lung injury--shortness of breath or trouble breathing, cough, spitting up blood, chest pain, fever Stomach pain, bloody diarrhea, pale skin, unusual weakness or fatigue, decrease in the amount of urine, which may be signs of hemolytic uremic syndrome Sudden and severe headache, confusion, change in vision, seizures, which may be signs of posterior reversible encephalopathy syndrome (PRES) Unusual bruising or bleeding Side effects that usually do not require medical attention (report to your care team if they continue or are bothersome): Diarrhea Drowsiness Hair loss Nausea Pain, redness, or swelling with sores inside the mouth or throat Vomiting This list may not describe all possible side effects. Call your doctor for medical advice about side effects. You may report side  effects to FDA at 1-800-FDA-1088. Where should I keep my medication? This medication is given in a hospital or clinic. It will not be stored at home. NOTE: This sheet is a summary. It may not cover all possible information. If you have questions about this medicine, talk to your doctor, pharmacist, or health care provider.  2023 Elsevier/Gold Standard (2007-12-06 00:00:00)

## 2022-12-03 ENCOUNTER — Ambulatory Visit: Payer: Self-pay

## 2022-12-03 ENCOUNTER — Other Ambulatory Visit (HOSPITAL_COMMUNITY): Payer: BC Managed Care – PPO

## 2022-12-03 ENCOUNTER — Encounter (HOSPITAL_COMMUNITY): Payer: BC Managed Care – PPO

## 2022-12-03 NOTE — Progress Notes (Signed)
Pt is currently taking lovenox and has been taken off of warfarin.

## 2022-12-05 ENCOUNTER — Inpatient Hospital Stay: Admit: 2022-12-05 | Payer: BC Managed Care – PPO | Admitting: Thoracic Surgery (Cardiothoracic Vascular Surgery)

## 2022-12-05 SURGERY — THORACOSCOPY, ROBOT-ASSISTED
Anesthesia: General | Site: Chest | Laterality: Left

## 2022-12-10 ENCOUNTER — Ambulatory Visit
Admission: RE | Admit: 2022-12-10 | Discharge: 2022-12-10 | Disposition: A | Discharge status: Home / Self Care | Payer: BC Managed Care – PPO | Source: Ambulatory Visit | Attending: Oncology | Admitting: Oncology

## 2022-12-10 DIAGNOSIS — J984 Other disorders of lung: Secondary | ICD-10-CM | POA: Diagnosis not present

## 2022-12-10 DIAGNOSIS — C7951 Secondary malignant neoplasm of bone: Secondary | ICD-10-CM | POA: Diagnosis not present

## 2022-12-10 DIAGNOSIS — C221 Intrahepatic bile duct carcinoma: Secondary | ICD-10-CM

## 2022-12-10 DIAGNOSIS — R590 Localized enlarged lymph nodes: Secondary | ICD-10-CM | POA: Diagnosis not present

## 2022-12-10 DIAGNOSIS — E049 Nontoxic goiter, unspecified: Secondary | ICD-10-CM | POA: Insufficient documentation

## 2022-12-10 DIAGNOSIS — E119 Type 2 diabetes mellitus without complications: Secondary | ICD-10-CM | POA: Insufficient documentation

## 2022-12-10 DIAGNOSIS — R160 Hepatomegaly, not elsewhere classified: Secondary | ICD-10-CM | POA: Diagnosis not present

## 2022-12-10 DIAGNOSIS — M7592 Shoulder lesion, unspecified, left shoulder: Secondary | ICD-10-CM | POA: Diagnosis not present

## 2022-12-10 LAB — GLUCOSE, CAPILLARY: Glucose-Capillary: 146 mg/dL — ABNORMAL HIGH (ref 70–99)

## 2022-12-10 MED ORDER — FLUDEOXYGLUCOSE F - 18 (FDG) INJECTION
15.1000 | Freq: Once | INTRAVENOUS | Status: AC | PRN
  Administered 2022-12-10: 13.78 via INTRAVENOUS

## 2022-12-12 ENCOUNTER — Inpatient Hospital Stay: Payer: BC Managed Care – PPO

## 2022-12-12 ENCOUNTER — Encounter: Payer: Self-pay | Admitting: Oncology

## 2022-12-12 ENCOUNTER — Other Ambulatory Visit: Payer: Self-pay | Admitting: Oncology

## 2022-12-12 ENCOUNTER — Telehealth: Payer: Self-pay

## 2022-12-12 ENCOUNTER — Inpatient Hospital Stay (HOSPITAL_BASED_OUTPATIENT_CLINIC_OR_DEPARTMENT_OTHER): Payer: BC Managed Care – PPO | Admitting: Oncology

## 2022-12-12 VITALS — BP 131/68 | HR 84 | Temp 98.1°F | Resp 18 | Ht 75.0 in | Wt 294.1 lb

## 2022-12-12 DIAGNOSIS — D6861 Antiphospholipid syndrome: Secondary | ICD-10-CM

## 2022-12-12 DIAGNOSIS — C221 Intrahepatic bile duct carcinoma: Secondary | ICD-10-CM

## 2022-12-12 DIAGNOSIS — C7951 Secondary malignant neoplasm of bone: Secondary | ICD-10-CM | POA: Diagnosis not present

## 2022-12-12 DIAGNOSIS — Z79620 Long term (current) use of immunosuppressive biologic: Secondary | ICD-10-CM | POA: Diagnosis not present

## 2022-12-12 DIAGNOSIS — J310 Chronic rhinitis: Secondary | ICD-10-CM

## 2022-12-12 DIAGNOSIS — R918 Other nonspecific abnormal finding of lung field: Secondary | ICD-10-CM | POA: Diagnosis not present

## 2022-12-12 DIAGNOSIS — M79602 Pain in left arm: Secondary | ICD-10-CM | POA: Diagnosis not present

## 2022-12-12 DIAGNOSIS — Z5111 Encounter for antineoplastic chemotherapy: Secondary | ICD-10-CM | POA: Diagnosis not present

## 2022-12-12 DIAGNOSIS — Z79899 Other long term (current) drug therapy: Secondary | ICD-10-CM | POA: Diagnosis not present

## 2022-12-12 DIAGNOSIS — Z7901 Long term (current) use of anticoagulants: Secondary | ICD-10-CM | POA: Diagnosis not present

## 2022-12-12 DIAGNOSIS — Z5112 Encounter for antineoplastic immunotherapy: Secondary | ICD-10-CM | POA: Diagnosis not present

## 2022-12-12 LAB — COMPREHENSIVE METABOLIC PANEL
ALT: 22 U/L (ref 0–44)
AST: 21 U/L (ref 15–41)
Albumin: 3.4 g/dL — ABNORMAL LOW (ref 3.5–5.0)
Alkaline Phosphatase: 57 U/L (ref 38–126)
Anion gap: 10 (ref 5–15)
BUN: 7 mg/dL — ABNORMAL LOW (ref 8–23)
CO2: 25 mmol/L (ref 22–32)
Calcium: 8.6 mg/dL — ABNORMAL LOW (ref 8.9–10.3)
Chloride: 106 mmol/L (ref 98–111)
Creatinine, Ser: 0.78 mg/dL (ref 0.61–1.24)
GFR, Estimated: >60 mL/min (ref >60)
Glucose, Bld: 124 mg/dL — ABNORMAL HIGH (ref 70–99)
Potassium: 4.1 mmol/L (ref 3.5–5.1)
Sodium: 141 mmol/L (ref 135–145)
Total Bilirubin: 0.5 mg/dL (ref 0.3–1.2)
Total Protein: 5.8 g/dL — ABNORMAL LOW (ref 6.5–8.1)

## 2022-12-12 LAB — CBC WITH DIFFERENTIAL/PLATELET
Abs Immature Granulocytes: 0.01 10*3/uL (ref 0.00–0.07)
Basophils Absolute: 0 10*3/uL (ref 0.0–0.1)
Basophils Relative: 0 %
Eosinophils Absolute: 0 10*3/uL (ref 0.0–0.5)
Eosinophils Relative: 1 %
HCT: 34.6 % — ABNORMAL LOW (ref 39.0–52.0)
Hemoglobin: 11.6 g/dL — ABNORMAL LOW (ref 13.0–17.0)
Immature Granulocytes: 0 %
Lymphocytes Relative: 11 %
Lymphs Abs: 0.4 10*3/uL — ABNORMAL LOW (ref 0.7–4.0)
MCH: 31.3 pg (ref 26.0–34.0)
MCHC: 33.5 g/dL (ref 30.0–36.0)
MCV: 93.3 fL (ref 80.0–100.0)
Monocytes Absolute: 0.6 10*3/uL (ref 0.1–1.0)
Monocytes Relative: 16 %
Neutro Abs: 2.6 10*3/uL (ref 1.7–7.7)
Neutrophils Relative %: 72 %
Platelets: 133 10*3/uL — ABNORMAL LOW (ref 150–400)
RBC: 3.71 MIL/uL — ABNORMAL LOW (ref 4.22–5.81)
RDW: 15.6 % — ABNORMAL HIGH (ref 11.5–15.5)
WBC: 3.6 10*3/uL — ABNORMAL LOW (ref 4.0–10.5)
nRBC: 0 % (ref 0.0–0.2)

## 2022-12-12 LAB — MAGNESIUM: Magnesium: 1.6 mg/dL — ABNORMAL LOW (ref 1.7–2.4)

## 2022-12-12 MED FILL — Dexamethasone Sodium Phosphate Inj 100 MG/10ML: INTRAMUSCULAR | Qty: 1 | Status: AC

## 2022-12-12 MED FILL — Fosaprepitant Dimeglumine For IV Infusion 150 MG (Base Eq): INTRAVENOUS | Qty: 5 | Status: AC

## 2022-12-12 NOTE — Assessment & Plan Note (Signed)
#  Recurrent lower extremity DVT, secondary to antiphospholipid syndrome.-.  Persistent lupus anticoagulant positive.Recommend life time anticoagulation Patient was previously on Coumadin now on Lovenox. continue Lovenox 1 mg/kg twice daily

## 2022-12-12 NOTE — Progress Notes (Signed)
Hematology/Oncology Progress note [phone] [phone]    CHIEF COMPLAINTS/REASON FOR VISIT:  Follow up for right lower extremity DVT, antiphospholipid syndrome, metastatic cholangiocarcinoma.  ASSESSMENT & PLAN:   Cancer Staging  Cholangiocarcinoma [facility]) Staging form: Intrahepatic Bile Duct, AJCC 8th Edition - Clinical stage from 08/27/2022: Stage IV (cT2, cN1, cM1) - Signed by Earlie Server, MD on 09/13/2022   Cholangiocarcinoma [facility]) Imaging findings and pathology results were reviewed with patient.  Clinical diagnosis is cholangiocarcinoma, although atypical with both normal CEA and CA 19.9 Interim PET scan after 3 cycles of treatment showed mixed response.  He has had new bone lesions. Refer to radiation oncology for feasibility of treating new bone lesions.  For now I will continue current regimen with short-term repeat imaging. I recommend to repeat NGS-Tempus tissue, tissue origin.  Liquid biopsy.  Labs are reviewed and discussed with patient. S/p 3 cycles  Gemcitabine, Cisplatin and Durvalumab.  Overall he tolerates very well.  Proceed with cycle 4 D1 Cisplatin Gemcitabine Durvalumab, he will return in 1 week of D8 Gemcitabine and Cisplatin treatment.     Antiphospholipid syndrome (HCC) #Recurrent lower extremity DVT, secondary to antiphospholipid syndrome.-.  Persistent lupus anticoagulant positive.Recommend life time anticoagulation Patient was previously on Coumadin now on Lovenox. continue Lovenox 1 mg/kg twice daily   Hypocalcemia Continue calcium supplementation.   Lung infiltrate on CT He follows up with pulmonology.  repeat CT images were reviewed and discussed with patient. I also discussed with pulmonology Dr. Silas Flood who recommends surgical biopsy. DDX includes organizing pneumonia, ILD, aspiration, lymphatic spread, etc. He declined repeat bronchoscopy/biopsy.     Rhinitis azelastine nasal spray PRN  Metastasis to bone [facility]) Refer  to radiation oncology. Consider bisphosphonate after dental clearance is obtained.  Orders Placed This Encounter  Procedures   CBC with Differential    Standing Status:   Future    Standing Expiration Date:   01/04/2024   Comprehensive metabolic panel    Standing Status:   Future    Standing Expiration Date:   01/04/2024   Magnesium    Standing Status:   Future    Standing Expiration Date:   01/04/2024   CBC with Differential    Standing Status:   Future    Standing Expiration Date:   01/11/2024   Comprehensive metabolic panel    Standing Status:   Future    Standing Expiration Date:   01/11/2024   Magnesium    Standing Status:   Future    Standing Expiration Date:   01/11/2024   CBC with Differential    Standing Status:   Future    Standing Expiration Date:   01/25/2024   Comprehensive metabolic panel    Standing Status:   Future    Standing Expiration Date:   01/25/2024   Magnesium    Standing Status:   Future    Standing Expiration Date:   01/25/2024   CBC with Differential    Standing Status:   Future    Standing Expiration Date:   02/01/2024   Comprehensive metabolic panel    Standing Status:   Future    Standing Expiration Date:   02/01/2024   Magnesium    Standing Status:   Future    Standing Expiration Date:   02/01/2024   Ambulatory referral to Radiation Oncology    Referral Priority:   Routine    Referral Type:   Consultation    Referral Reason:   Specialty Services Required    Requested Specialty:  Radiation Oncology    Number of Visits Requested:   1    Follow up Per LOS  All questions were answered. The patient knows to call the clinic with any problems, questions or concerns.  Earlie Server, MD, PhD [facility] Hematology Oncology 12/12/2022    HISTORY OF PRESENTING ILLNESS:  Patient reports remote history of left lower extremity DVT in 2002.  He was initiated on Lovenox and bridged to Coumadin.  Patient took warfarin for 2 years before anticoagulation was  stopped. 12/03/2021, patient presented to emergency room for evaluation of right lower extremity pain and swelling for about a week.  Started on the inner side of right thigh and migrated to the right calf. + Associated with swelling.  Denies any recent injury, hospitalization, surgery.  He first noticed the symptoms after playing basketball with his grandson.   12/03/2021, right lower extremity ultrasound showed occlusive DVT extending from the mid aspect of the femoral vein through the imaged tibial vein.  Age-indeterminate. Patient was started on Xarelto. He was referred to establish care with vascular surgeon and was seen by [name] on 12/06/2021.  Shared decision was made not to proceed with embolectomy.  Continue anticoagulation. Patient was referred to hematology oncology for further evaluation.  Patient denies any family history of blood clots.  Denies any unintentional weight loss, fever or night sweats. 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  He sometimes stay in his car while waiting for next assessment appointment.  He reports the right lower extremity symptom has improved since the start of Xarelto.  No active bleeding events.  #12/18/2021 hypercoagulable work-up showed JAK2 V617F mutation negative, with reflex to other mutations CALR, MPL, JAK 2 Ex 12-15 mutations negative, negative anticardiolipin IgG and IgM antibodies, positive lupus anticoagulant, negative factor V Leiden mutation, negative prothrombin gene mutation, normal protein C activity, normal protein S antigen level. Patient was recommended to switch to Coumadin with INR goal of 2-3.  #03/15/2022, repeat lupus anticoagulant is persistently positive- + antiphospholipid syndrome.  Patient is currently on Lovenox 1 mg/kg twice daily.  admitted due to multifocal pneumonia, treated with Influenza panel negative, COVID-negative, HIV  negative, Complete course of antibiotics with Zithromax, Vantin   + HCV antibody positive, HCV RNA quantification not detected. HCV RT PCR not detected.   INTERVAL HISTORY Christopher Marsh is a 62 y.o. male who has above history reviewed by me today presents for follow up visit for management of right lower extremity DVT and antiphospholipid syndrome, metastatic cholangiocarcinoma.  Oncology History  Cholangiocarcinoma ([place])  10/30/2021 Imaging   CT chest wo contrast 1. Heterogeneous bilateral ground-glass disease, multilobar but worst in the right middle lobe, lingula and lower lobes. There is progression of the abnormality since 10/12/2022. Though pneumonia could produce this appearance, given persistence and fluctuating appearance since October, consider alternative entities such as hypersensitivity pneumonitis or idiopathic interstitial pneumonia. 2. Slightly enlarged pulmonary trunk, possible arterial hypertension 3. Redemonstrated large hypodense right hepatic lobe mass.   08/18/2022 Imaging   CT chest w contrast showed 1. Multifocal airspace disease in both lungs, left side greater than right. Findings are most compatible with multifocal pneumonia. 2. Multiple new hepatic lesions with enlarging lymph nodes in the upper abdomen and chest. Findings are concerning for metastatic disease. Based on the multifocal pneumonia, hepatic abscesses would also be in the differential diagnosis. Recommend further characterization of the abdomen and pelvis with CT  with IV contrast. 3. Enlargement of the main pulmonary artery could be associated with pulmonary hypertension. 4. Coronary artery calcifications. 5. Multinodular goiter. Patient has known thyroid nodules and previous thyroid ultrasound.    08/18/2022 Imaging   CT abdomen pelvis w contrast  Interval development of 6.5 x 5.2 cm heterogeneously enhancing mass in right hepatic lobe. This is highly concerning for neoplasm or malignancy, and  further evaluation with MRI is recommended.   Mildly enlarged periaortic adenopathy is noted, with the largest lymph node measuring 12 mm. Metastatic disease cannot be excluded. Mildly enlarged adenopathy is also noted in the gastrohepatic ligament, but this is unchanged compared to prior exam.   Stable bibasilar lung opacities are noted concerning for inflammation, atelectasis or possibly scarring.   Grossly stable multi-septated cystic lesion is seen involving upper pole of right kidney with peripheral calcifications compared to prior exam of 2019, most likely representing benign etiology.   Aortic Atherosclerosis    08/18/2022 - 08/21/2022 Hospital Admission   Admitted due to multifocal pneumonia, treated with Influenza panel negative, COVID-negative, HIV negative, Complete course of antibiotics with Zithromax, Vantin     08/19/2022 Imaging   MR abdomen w wo contrast  Marked caudate lobe hypertrophy, highly suspicious for hepatic cirrhosis.   Numerous hypervascular masses throughout the right hepatic lobe, highly suspicious for multifocal hepatocellular carcinoma. Recommend correlation with AFP and consider tissue sampling.   Mild abdominal lymphadenopathy, with differential diagnosis including metastatic disease and reactive lymphadenopathy in setting of cirrhosis.   Bilateral lower lobe infiltrates, as better demonstrated on recent CT.    08/19/2022 Tumor Marker   AFP 4.1   08/27/2022 Initial Diagnosis   Cholangiocarcinoma  -08/20/22 Liver mass biopsy showed poorly differentiated carcinoma Immunohistochemical stains show that the poorly differentiated carcinoma is positive for CK7 and MOC-31, suggestive of a poorly differentiated adenocarcinoma.  Immunohistochemical stains for CK20, CDX2, HepPar 1 and arginase are negative.  Immunostain for glypican-3 shows very focal  likely nonspecific labeling.  This immunoprofile is nonspecific but can be compatible with a poorly  differentiated primary cholangiocarcinoma in absence of any other lesions.   NGS showed BAP 1 mutation.  Case was presented at tumor board, I have also discussed with pathologist Dr.Rubinas,  IHC pattern is more consistent with adenocarcinoma, unlikely [place] [due to negative HepPar 1 and arginase], cholangiocarcinoma is favored, although this is a diagnosis of exclusion. Second opinion ar [facility]   08/27/2022 Cancer Staging   Staging form: Intrahepatic Bile Duct, AJCC 8th Edition - Clinical stage from 08/27/2022: Stage IV (cT2, cN1, cM1) - Signed by Earlie Server, MD on 09/13/2022 Stage prefix: Initial diagnosis   08/27/2022 Tumor Marker   CA 19.9  16 CEA 2.8   09/12/2022 Imaging   PET scan showed 1. Large right hepatic lobe mass is hypermetabolic and consistent with known cholangiocarcinoma. 2. Scattered borderline enlarged upper abdominal lymph nodes do not show any significant FDG uptake. 3. 4 hypermetabolic bone lesions consistent with metastatic disease. 4. Progressive diffuse airspace process in the left lung could be due to atypical pneumonia, pulmonary hemorrhage or left-sided aspiration. 5. Aortic atherosclerosis.  09/12/2022 I had a phone discussion with patient after his PET scan resulted.  Recommend systemic chemotherapy.  Patient would like to defer until his second opinion visit at [facility].   09/15/2022 Imaging   CT angio chest pulmonary embolism protocol showed 1. No evidence for pulmonary embolism. 2. Progression of bilateral multifocal patchy ground-glass and interstitial opacities, left greater than right. Findings are concerning for  multifocal pneumonia. 3. Air-fluid levels in the left lower lobe bronchi worrisome for aspiration. 4. Mediastinal and hilar lymphadenopathy has increased in size and number when compared to the prior study, likely reactive.5. Cardiomegaly. 6. Findings compatible with pulmonary artery hypertension.7. Stable right liver mass and complex right renal  cystic lesion. 8. Stable enlargement of the left thyroid.   09/15/2022 - 09/17/2022 Hospital Admission   Patient was admitted due to shortness of breath, CT showed progression of bilateral multifocal patchy groundglass and interstitial opacities.  Left greater than right.  Concerning for multifocal pneumonia.  Patient was started on broad-spectrum antibiotics with significant improvement.  He was discharged on oral antibiotics.   09/19/2022 Miscellaneous   Patient went to [facility] for second opinion.  He was seen by Stanton Kidney, who felt that the presentation is consistent with metastatic cholangiocarcinoma, and agrees with the plan of cisplatin/gemcitabine/durvalumab..    10/11/2022 -  Chemotherapy   Patient is on Treatment Plan : Cisplatin D1,8 + Gemcitabine + Durvalumab  D1,8 q21d x     10/12/2022 Imaging   CT chest w contrast 1. Bilateral airspace and ground-glass opacities have mildly decreased in amount and density when compared to the prior study. Findings are compatible with resolving multifocal pneumonia. Continued follow-up imaging recommended. 2. Stable enlarged heterogeneous left thyroid gland with 3.2 cm nodule. This can be further evaluated with dedicated ultrasound. 3. Stable hepatic and right renal lesions, incompletely evaluated.   Aortic Atherosclerosis (ICD10-I70.0).   12/11/2022 Imaging   PET showed  1. There is been mild increase in size of the tracer avid mass within right lobe of liver compatible with known cholangiocarcinoma. The degree of tracer uptake however is slightly decreased in the interval. 2. Multifocal tracer avid bone metastases are again noted. When compared with the previous exam there has been an mixed interval response to therapy. Specifically, there has been interval increase in size and FDG uptake associated with the right ischial tuberosity lesion and there is a new lesion within the posterior left iliac bone. Decreased tracer uptake is associated with  the left scapular lesion and there is stable tracer uptake associated with the right T1 transverse process lesion. 3. Persistent ground-glass and airspace densities within the left upper lobe and left lower lobe compatible with inflammatory or infectious process. Interval resolution of previous right lung ground-glass and airspace densities.      He tolerated chemotherapy. + fatigue after treatment. No nausea vomiting diarrhea + No fever, chills.  He denies any pain  Review of Systems  Constitutional:  Positive for fatigue. Negative for appetite change, chills and fever.  HENT:   Negative for hearing loss and voice change.   Eyes:  Negative for eye problems.  Respiratory:  Positive for shortness of breath. Negative for chest tightness and cough.   Cardiovascular:  Negative for chest pain.  Gastrointestinal:  Negative for abdominal distention, abdominal pain and blood in stool.  Endocrine: Negative for hot flashes.  Genitourinary:  Negative for difficulty urinating and frequency.   Musculoskeletal:  Negative for arthralgias.  Skin:  Negative for itching and rash.  Neurological:  Negative for extremity weakness.  Hematological:  Negative for adenopathy. Bruises/bleeds easily.  Psychiatric/Behavioral:  Negative for confusion.     MEDICAL HISTORY:  Past Medical History:  Diagnosis Date   Antiphospholipid antibody syndrome (HCC)    Arthritis    Diabetes mellitus without complication (HCC)    DVT (deep venous thrombosis) (HCC)    Lt leg   Dyspnea  Elevated lipids    Erythrocytosis    Hyperlipidemia    Hypertension    Lower extremity edema    Multinodular thyroid    Post-thrombotic syndrome     SURGICAL HISTORY: Past Surgical History:  Procedure Laterality Date   COLONOSCOPY WITH PROPOFOL N/A 09/23/2018   Per Dr. Alice Reichert, polyp, repeat in 3 yrs    EYE MUSCLE SURGERY     JOINT REPLACEMENT Left 07/22/2017   TOTAL HIP ARTHROPLASTY Left 07/22/2017   Procedure: TOTAL HIP  ARTHROPLASTY;  Surgeon: Dereck Leep, MD;  Location: [facility] ORS;  Service: Orthopedics;  Laterality: Left;    SOCIAL HISTORY: Social History   Socioeconomic History   Marital status: Married    Spouse name: Not on file   Number of children: Not on file   Years of education: Not on file   Highest education level: Not on file  Occupational History   Not on file  Tobacco Use   Smoking status: Former    Packs/day: 0.50    Years: 6.00    Total pack years: 3.00    Types: Cigarettes    Quit date: 41    Years since quitting: 42.1   Smokeless tobacco: Never   Tobacco comments:    Pt states when he did smoke he smoked at most 1 ppd. ALS 09/26/2022  Vaping Use   Vaping Use: Never used  Substance and Sexual Activity   Alcohol use: Yes    Comment: 2-3 nights a week   Drug use: No   Sexual activity: Not on file  Other Topics Concern   Not on file  Social History Narrative   Not on file   Social Determinants of Health   Financial Resource Strain: Low Risk  (08/27/2022)   Overall Financial Resource Strain (CARDIA)    Difficulty of Paying Living Expenses: Not very hard  Food Insecurity: No Food Insecurity (09/15/2022)   Hunger Vital Sign    Worried About Running Out of Food in the Last Year: Never true    Ran Out of Food in the Last Year: Never true  Transportation Needs: No Transportation Needs (09/15/2022)   PRAPARE - Hydrologist (Medical): No    Lack of Transportation (Non-Medical): No  Physical Activity: Insufficiently Active (08/27/2022)   Exercise Vital Sign    Days of Exercise per Week: 3 days    Minutes of Exercise per Session: 30 min  Stress: Stress Concern Present (08/27/2022)   [place]    Feeling of Stress : Rather much  Social Connections: Socially Integrated (08/27/2022)   Social Connection and Isolation Panel [NHANES]    Frequency of Communication with Friends  and Family: More than three times a week    Frequency of Social Gatherings with Friends and Family: More than three times a week    Attends Religious Services: 1 to 4 times per year    Active Member of [employer] or Organizations: Yes    Attends Archivist Meetings: Never    Marital Status: Married  Human resources officer Violence: Not At Risk (09/15/2022)   Humiliation, Afraid, Rape, and Kick questionnaire    Fear of Current or Ex-Partner: No    Emotionally Abused: No    Physically Abused: No    Sexually Abused: No    FAMILY HISTORY: Family History  Problem Relation Age of Onset   Breast cancer Mother    Emphysema Father    Colon cancer  Maternal Grandmother     ALLERGIES:  has No Known Allergies.  MEDICATIONS:  Current Outpatient Medications  Medication Sig Dispense Refill   albuterol (VENTOLIN HFA) 108 (90 Base) MCG/ACT inhaler Inhale 2 puffs into the lungs every 4 (four) hours as needed. 18 g 1   amLODipine (NORVASC) 10 MG tablet Take 1 tablet (10 mg total) by mouth daily. 30 tablet 0   benzonatate (TESSALON) 200 MG capsule Take 1 capsule (200 mg total) by mouth 3 (three) times daily as needed for cough. 30 capsule 3   Calcium Carb-Cholecalciferol (CALCIUM 500 + D PO) Take 1 tablet by mouth daily.     dexamethasone (DECADRON) 4 MG tablet Take 2 tablets daily x 3 days starting the day after cisplatin chemotherapy. Take with food. 30 tablet 1   enoxaparin (LOVENOX) 120 MG/0.8ML injection Inject 0.8 mLs (120 mg total) into the skin every 12 (twelve) hours. 48 mL 2   glipiZIDE (GLUCOTROL) 10 MG tablet Take 1 tablet (10 mg total) by mouth 2 (two) times daily before a meal. (Patient taking differently: Take 10 mg by mouth daily before breakfast.) 180 tablet 3   glucose blood test strip 1 each by Other route as needed for other. accu chek aviva plusUse as instructed     LORazepam (ATIVAN) 0.5 MG tablet Take 1 tablet (0.5 mg total) by mouth every 8 (eight) hours as needed for anxiety  (nausea vomiting). 60 tablet 0   magnesium oxide (MAG-OX) 400 (240 Mg) MG tablet Take 400 mg by mouth in the morning.     metFORMIN (GLUCOPHAGE) 500 MG tablet TAKE TWO TABLETS BY MOUTH EVERY MORNING WITH BREAKFAST 180 tablet 1   MUCINEX D MAX STRENGTH [phone] MG TB12 Take 1,200 mg by mouth 2 (two) times daily as needed (for congestion or to loosen mucous in the chest).     ondansetron (ZOFRAN) 8 MG tablet Take 1 tablet (8 mg total) by mouth every 8 (eight) hours as needed for nausea or vomiting. Start on the third day after cisplatin. 30 tablet 1   pravastatin (PRAVACHOL) 20 MG tablet Take 1 tablet (20 mg total) by mouth daily. 90 tablet 3   prochlorperazine (COMPAZINE) 10 MG tablet Take 1 tablet (10 mg total) by mouth every 6 (six) hours as needed (Nausea or vomiting). 30 tablet 1   tadalafil (CIALIS) 20 MG tablet Take 0.5-1 tablets (10-20 mg total) by mouth every other day as needed for erectile dysfunction. 20 tablet 11   triamcinolone cream (KENALOG) 0.1 % Apply 1 Application topically 3 (three) times daily. (Patient taking differently: Apply 1 Application topically 3 (three) times daily as needed (for bilateral, below-the-knees skin irritation).) 453.6 g 1   Azelastine & Fluticasone 137 & 50 MCG/ACT THPK Place 1 spray into the nose daily as needed. (Patient not taking: Reported on 12/12/2022) 1 each 1   No current facility-administered medications for this visit.     PHYSICAL EXAMINATION: ECOG PERFORMANCE STATUS: 1 - Symptomatic but completely ambulatory Vitals:   12/12/22 0906  BP: 131/68  Pulse: 84  Resp: 18  Temp: 98.1 F (36.7 C)   Filed Weights   12/12/22 0906  Weight: 294 lb 1.6 oz (133.4 kg)    Physical Exam Constitutional:      General: He is not in acute distress. HENT:     Head: Normocephalic and atraumatic.  Eyes:     General: No scleral icterus. Cardiovascular:     Rate and Rhythm: Normal rate.  Pulmonary:  Effort: Pulmonary effort is normal. No respiratory  distress.     Breath sounds: No wheezing.  Abdominal:     General: Bowel sounds are normal. There is no distension.  Musculoskeletal:        General: Normal range of motion.     Cervical back: Normal range of motion and neck supple.     Right lower leg: No edema.     Comments: Bilateral chronic lower extremity swelling, varicose veins  Skin:    General: Skin is warm and dry.     Findings: No erythema or rash.  Neurological:     Mental Status: He is alert and oriented to person, place, and time. Mental status is at baseline.     Cranial Nerves: No cranial nerve deficit.  Psychiatric:        Mood and Affect: Mood normal.     LABORATORY DATA:  I have reviewed the data as listed    Latest Ref Rng & Units 12/12/2022    8:42 AM 11/29/2022    8:16 AM 11/21/2022    2:09 PM  CBC  WBC 4.0 - 10.5 K/uL 3.6  3.7  5.8   Hemoglobin 13.0 - 17.0 g/dL 11.6  13.3  13.8   Hematocrit 39.0 - 52.0 % 34.6  39.6  42.9   Platelets 150 - 400 K/uL 133  210  170       Latest Ref Rng & Units 12/12/2022    8:42 AM 11/29/2022    8:16 AM 11/21/2022    2:09 PM  CMP  Glucose 70 - 99 mg/dL 124  160  363   BUN 8 - 23 mg/dL 7  7  12   $ Creatinine 0.61 - 1.24 mg/dL 0.78  0.74  0.83   Sodium 135 - 145 mmol/L 141  138  135   Potassium 3.5 - 5.1 mmol/L 4.1  5.1  5.2   Chloride 98 - 111 mmol/L 106  98  98   CO2 22 - 32 mmol/L 25  29  26   $ Calcium 8.9 - 10.3 mg/dL 8.6  9.1  8.6   Total Protein 6.5 - 8.1 g/dL 5.8  6.0  6.3   Total Bilirubin 0.3 - 1.2 mg/dL 0.5  0.4  0.2   Alkaline Phos 38 - 126 U/L 57  64  62   AST 15 - 41 U/L 21  23  22   $ ALT 0 - 44 U/L 22  32  26     RADIOGRAPHIC STUDIES: I have personally reviewed the radiological images as listed and agreed with the findings in the report. NM PET Image Restag (PS) Skull Base To Thigh  Result Date: 12/11/2022 CLINICAL DATA:  Subsequent treatment strategy for cholangiocarcinoma. EXAM: NUCLEAR MEDICINE PET SKULL BASE TO THIGH TECHNIQUE: 13.7 mCi F-18 FDG was  injected intravenously. Full-ring PET imaging was performed from the skull base to thigh after the radiotracer. CT data was obtained and used for attenuation correction and anatomic localization. Fasting blood glucose: 146 mg/dl COMPARISON:  PET-CT 09/12/2022 FINDINGS: Mediastinal blood pool activity: SUV max 1.67 Liver activity: SUV max NA NECK: No hypermetabolic lymph nodes in the neck. Incidental CT findings: Heterogeneous and enlarged left lobe of thyroid gland is again noted. This has been evaluated on previous imaging. (ref: J Am Coll Radiol. 2015 Feb;12(2): 143-50). CHEST: No hypermetabolic mediastinal or hilar nodes. No suspicious pulmonary nodules on the CT scan. Incidental CT findings: Persistent diffuse ground-glass and airspace disease within the left upper lobe  and left lower lobe identified. Interval resolution of previous right lung ground-glass and airspace densities. ABDOMEN/PELVIS: Right lobe of liver mass scratch set tracer avid lesion scratch set tracer avid mass within the right hepatic lobe is again seen. This is somewhat ill-defined measuring approximately 8.2 x 5.6 cm within SUV max of 5.88, image 149/2. On the prior PET-CT this measured approximately 6.6 x 4.2 cm with SUV max of 7.09. No abnormal tracer uptake identified within the pancreas, spleen or adrenal glands. No tracer avid abdominopelvic lymph nodes. Incidental CT findings: Prominent upper abdominal nodes are again noted as seen previously, including reference gastrohepatic ligament lymph node measuring 1.7 cm with SUV max of 1.87, image, image 155/2. Previously this measured 1.9 cm with SUV max of 1.58. Unchanged right kidney cyst. No follow-up imaging recommended. SKELETON: Multifocal tracer avid bone metastases are again noted. Index lesions include: -expansile lytic lesion involving the right T1 transverse process with SUV max of 6.52, image 59/2. Previously this had an SUV max of 6.82. -Lytic lesion associated with the base of  the left coracoid process has an SUV max of 2.67, image 67/2. Previously 4.25. -Expansile lesion involving the right ischial tuberosity measures 2.4 cm within SUV max of 6.41, image 268/2. On the prior exam this measured 1.3 cm within SUV max of 4.08. -Tracer avid lesion involving the left ischial tuberosity has an SUV max of 3.28, image 265/2 Unchanged from previous exam. -New lesion within the posterior left iliac bone has an SUV max of 5.67, image 243/2. Incidental CT findings: None. IMPRESSION: 1. There is been mild increase in size of the tracer avid mass within right lobe of liver compatible with known cholangiocarcinoma. The degree of tracer uptake however is slightly decreased in the interval. 2. Multifocal tracer avid bone metastases are again noted. When compared with the previous exam there has been an mixed interval response to therapy. Specifically, there has been interval increase in size and FDG uptake associated with the right ischial tuberosity lesion and there is a new lesion within the posterior left iliac bone. Decreased tracer uptake is associated with the left scapular lesion and there is stable tracer uptake associated with the right T1 transverse process lesion. 3. Persistent ground-glass and airspace densities within the left upper lobe and left lower lobe compatible with inflammatory or infectious process. Interval resolution of previous right lung ground-glass and airspace densities. Electronically Signed   By: Kerby Moors M.D.   On: 12/11/2022 06:37

## 2022-12-12 NOTE — Assessment & Plan Note (Signed)
Refer to radiation oncology. Consider bisphosphonate after dental clearance is obtained.

## 2022-12-12 NOTE — Assessment & Plan Note (Addendum)
Imaging findings and pathology results were reviewed with patient.  Clinical diagnosis is cholangiocarcinoma, although atypical with both normal CEA and CA 19.9 Interim PET scan after 3 cycles of treatment showed mixed response.  He has had new bone lesions. Refer to radiation oncology for feasibility of treating new bone lesions.  For now I will continue current regimen with short-term repeat imaging. I recommend to repeat [facility] tissue, tissue origin.  Liquid biopsy.  Labs are reviewed and discussed with patient. S/p 3 cycles  Gemcitabine, Cisplatin and Durvalumab.  Overall he tolerates very well.  Proceed with cycle 4 D1 Cisplatin Gemcitabine Durvalumab, he will return in 1 week of D8 Gemcitabine and Cisplatin treatment.

## 2022-12-12 NOTE — Telephone Encounter (Addendum)
[facility] tesing( xR + xT) plus TO, request submitted via [facility] portal. Request for Liquid bx (xF) was also submitted via portal. Blood was collected today and was taken to FED EX box for shipping.    Specimen [id number]   Financial application submitted and pt did not qualify.    Demographics, copy of ins card, OVN and path report will be faxed to [facility].   Tracking# [id number]

## 2022-12-12 NOTE — Assessment & Plan Note (Signed)
azelastine nasal spray PRN

## 2022-12-12 NOTE — Assessment & Plan Note (Signed)
-   Continue calcium supplementation. 

## 2022-12-12 NOTE — Assessment & Plan Note (Signed)
He follows up with pulmonology.  repeat CT images were reviewed and discussed with patient. I also discussed with pulmonology Dr. Silas Flood who recommends surgical biopsy. DDX includes organizing pneumonia, ILD, aspiration, lymphatic spread, etc. He declined repeat bronchoscopy/biopsy.

## 2022-12-13 ENCOUNTER — Other Ambulatory Visit: Payer: Self-pay

## 2022-12-13 ENCOUNTER — Telehealth (INDEPENDENT_AMBULATORY_CARE_PROVIDER_SITE_OTHER): Payer: Self-pay

## 2022-12-13 ENCOUNTER — Telehealth: Payer: Self-pay

## 2022-12-13 ENCOUNTER — Encounter: Payer: Self-pay | Admitting: Oncology

## 2022-12-13 ENCOUNTER — Ambulatory Visit: Payer: BC Managed Care – PPO | Admitting: Oncology

## 2022-12-13 ENCOUNTER — Other Ambulatory Visit: Payer: BC Managed Care – PPO

## 2022-12-13 ENCOUNTER — Inpatient Hospital Stay: Payer: BC Managed Care – PPO

## 2022-12-13 VITALS — BP 161/68 | HR 98 | Temp 97.3°F | Resp 18

## 2022-12-13 DIAGNOSIS — C221 Intrahepatic bile duct carcinoma: Secondary | ICD-10-CM

## 2022-12-13 DIAGNOSIS — M79602 Pain in left arm: Secondary | ICD-10-CM | POA: Diagnosis not present

## 2022-12-13 DIAGNOSIS — C7951 Secondary malignant neoplasm of bone: Secondary | ICD-10-CM | POA: Diagnosis not present

## 2022-12-13 DIAGNOSIS — Z5111 Encounter for antineoplastic chemotherapy: Secondary | ICD-10-CM | POA: Diagnosis not present

## 2022-12-13 DIAGNOSIS — Z5112 Encounter for antineoplastic immunotherapy: Secondary | ICD-10-CM | POA: Diagnosis not present

## 2022-12-13 DIAGNOSIS — Z79899 Other long term (current) drug therapy: Secondary | ICD-10-CM | POA: Diagnosis not present

## 2022-12-13 DIAGNOSIS — Z7901 Long term (current) use of anticoagulants: Secondary | ICD-10-CM | POA: Diagnosis not present

## 2022-12-13 DIAGNOSIS — Z79620 Long term (current) use of immunosuppressive biologic: Secondary | ICD-10-CM | POA: Diagnosis not present

## 2022-12-13 DIAGNOSIS — J310 Chronic rhinitis: Secondary | ICD-10-CM | POA: Diagnosis not present

## 2022-12-13 DIAGNOSIS — R918 Other nonspecific abnormal finding of lung field: Secondary | ICD-10-CM | POA: Diagnosis not present

## 2022-12-13 DIAGNOSIS — D6861 Antiphospholipid syndrome: Secondary | ICD-10-CM | POA: Diagnosis not present

## 2022-12-13 LAB — CANCER ANTIGEN 19-9: CA 19-9: 115 U/mL — ABNORMAL HIGH (ref 0–35)

## 2022-12-13 LAB — CEA: CEA: 13.5 ng/mL — ABNORMAL HIGH (ref 0.0–4.7)

## 2022-12-13 MED ORDER — SODIUM CHLORIDE 0.9 % IV SOLN
Freq: Once | INTRAVENOUS | Status: AC
  Filled 2022-12-13: qty 250

## 2022-12-13 MED ORDER — MAGNESIUM SULFATE 2 GM/50ML IV SOLN
2.0000 g | Freq: Once | INTRAVENOUS | Status: AC
  Administered 2022-12-13: 2 g via INTRAVENOUS
  Filled 2022-12-13: qty 50

## 2022-12-13 MED ORDER — PALONOSETRON HCL INJECTION 0.25 MG/5ML
0.2500 mg | Freq: Once | INTRAVENOUS | Status: AC
  Administered 2022-12-13: 0.25 mg via INTRAVENOUS
  Filled 2022-12-13: qty 5

## 2022-12-13 MED ORDER — SODIUM CHLORIDE 0.9 % IV SOLN
25.0000 mg/m2 | Freq: Once | INTRAVENOUS | Status: AC
  Administered 2022-12-13: 66 mg via INTRAVENOUS
  Filled 2022-12-13: qty 66

## 2022-12-13 MED ORDER — POTASSIUM CHLORIDE IN NACL 20-0.9 MEQ/L-% IV SOLN
Freq: Once | INTRAVENOUS | Status: AC
  Filled 2022-12-13: qty 1000

## 2022-12-13 MED ORDER — SODIUM CHLORIDE 0.9 % IV SOLN
10.0000 mg/kg | Freq: Once | INTRAVENOUS | Status: AC
  Administered 2022-12-13: 1360 mg via INTRAVENOUS
  Filled 2022-12-13: qty 20

## 2022-12-13 MED ORDER — SODIUM CHLORIDE 0.9 % IV SOLN
150.0000 mg | Freq: Once | INTRAVENOUS | Status: AC
  Administered 2022-12-13: 150 mg via INTRAVENOUS
  Filled 2022-12-13: qty 150

## 2022-12-13 MED ORDER — SODIUM CHLORIDE 0.9 % IV SOLN
Freq: Once | INTRAVENOUS | Status: DC
  Filled 2022-12-13: qty 250

## 2022-12-13 MED ORDER — SODIUM CHLORIDE 0.9 % IV SOLN
1000.0000 mg/m2 | Freq: Once | INTRAVENOUS | Status: AC
  Administered 2022-12-13: 2662 mg via INTRAVENOUS
  Filled 2022-12-13: qty 52.57

## 2022-12-13 MED ORDER — SODIUM CHLORIDE 0.9 % IV SOLN
10.0000 mg | Freq: Once | INTRAVENOUS | Status: AC
  Administered 2022-12-13: 10 mg via INTRAVENOUS
  Filled 2022-12-13: qty 10

## 2022-12-13 NOTE — Progress Notes (Signed)
Blood return checked from PIV before each med, noted good return each time.  Pt verbalized understanding of telling nurse if any discomfort at IV site.  Pt is getting on schedule to get  Dr [facility]

## 2022-12-13 NOTE — Patient Instructions (Signed)
[facility]  Discharge Instructions: Thank you for choosing [facility] to provide your oncology and hematology care.  If you have a lab appointment with the [facility], please go directly to the [facility] and check in at the registration area.  Wear comfortable clothing and clothing appropriate for easy access to any Portacath or PICC line.   We strive to give you quality time with your provider. You may need to reschedule your appointment if you arrive late (15 or more minutes).  Arriving late affects you and other patients whose appointments are after yours.  Also, if you miss three or more appointments without notifying the office, you may be dismissed from the clinic at the provider's discretion.      For prescription refill requests, have your pharmacy contact our office and allow 72 hours for refills to be completed.    Today you received the following chemotherapy and/or immunotherapy agents IMFINZI, GEMZAR, CISPLATIN      To help prevent nausea and vomiting after your treatment, we encourage you to take your nausea medication as directed.  BELOW ARE SYMPTOMS THAT SHOULD BE REPORTED IMMEDIATELY: *FEVER GREATER THAN 100.4 F (38 C) OR HIGHER *CHILLS OR SWEATING *NAUSEA AND VOMITING THAT IS NOT CONTROLLED WITH YOUR NAUSEA MEDICATION *UNUSUAL SHORTNESS OF BREATH *UNUSUAL BRUISING OR BLEEDING *URINARY PROBLEMS (pain or burning when urinating, or frequent urination) *BOWEL PROBLEMS (unusual diarrhea, constipation, pain near the anus) TENDERNESS IN MOUTH AND THROAT WITH OR WITHOUT PRESENCE OF ULCERS (sore throat, sores in mouth, or a toothache) UNUSUAL RASH, SWELLING OR PAIN  UNUSUAL VAGINAL DISCHARGE OR ITCHING   Items with * indicate a potential emergency and should be followed up as soon as possible or go to the Emergency Department if any problems should occur.  Please show the CHEMOTHERAPY ALERT CARD or IMMUNOTHERAPY ALERT  CARD at check-in to the Emergency Department and triage nurse.  Should you have questions after your visit or need to cancel or reschedule your appointment, please contact [facility]  [phone] and follow the prompts.  Office hours are 8:00 a.m. to 4:30 p.m. Monday - Friday. Please note that voicemails left after 4:00 p.m. may not be returned until the following business day.  We are closed weekends and major holidays. You have access to a nurse at all times for urgent questions. Please call the main number to the clinic [phone] and follow the prompts.  For any non-urgent questions, you may also contact your provider using MyChart. We now offer e-Visits for anyone 68 and older to request care online for non-urgent symptoms. For details visit [email or web address].   Also download the MyChart app! Go to the app store, search "MyChart", open the app, select [facility], and log in with your MyChart username and password.  Durvalumab Injection What is this medication? DURVALUMAB (dur VAL ue mab) treats some types of cancer. It works by helping your immune system slow or stop the spread of cancer cells. It is a monoclonal antibody. This medicine may be used for other purposes; ask your health care provider or pharmacist if you have questions. COMMON BRAND NAME(S): IMFINZI What should I tell my care team before I take this medication? They need to know if you have any of these conditions: Allogeneic stem cell transplant (uses someone else's stem cells) Autoimmune diseases, such as Crohn disease, ulcerative colitis, lupus History of chest radiation Nervous system problems, such as Guillain-Barre syndrome, myasthenia  gravis Organ transplant An unusual or allergic reaction to durvalumab, other medications, foods, dyes, or preservatives Pregnant or trying to get pregnant Breast-feeding How should I use this medication? This medication is infused into a  vein. It is given by your care team in a hospital or clinic setting. A special MedGuide will be given to you before each treatment. Be sure to read this information carefully each time. Talk to your care team about the use of this medication in children. Special care may be needed. Overdosage: If you think you have taken too much of this medicine contact a poison control center or emergency room at once. NOTE: This medicine is only for you. Do not share this medicine with others. What if I miss a dose? Keep appointments for follow-up doses. It is important not to miss your dose. Call your care team if you are unable to keep an appointment. What may interact with this medication? Interactions have not been studied. This list may not describe all possible interactions. Give your health care provider a list of all the medicines, herbs, non-prescription drugs, or dietary supplements you use. Also tell them if you smoke, drink alcohol, or use illegal drugs. Some items may interact with your medicine. What should I watch for while using this medication? Your condition will be monitored carefully while you are receiving this medication. You may need blood work while taking this medication. This medication may cause serious skin reactions. They can happen weeks to months after starting the medication. Contact your care team right away if you notice fevers or flu-like symptoms with a rash. The rash may be red or purple and then turn into blisters or peeling of the skin. You may also notice a red rash with swelling of the face, lips, or lymph nodes in your neck or under your arms. Tell your care team right away if you have any change in your eyesight. Talk to your care team if you may be pregnant. Serious birth defects can occur if you take this medication during pregnancy and for 3 months after the last dose. You will need a negative pregnancy test before starting this medication. Contraception is recommended  while taking this medication and for 3 months after the last dose. Your care team can help you find the option that works for you. Do not breastfeed while taking this medication and for 3 months after the last dose. What side effects may I notice from receiving this medication? Side effects that you should report to your care team as soon as possible: Allergic reactions--skin rash, itching, hives, swelling of the face, lips, tongue, or throat Dry cough, shortness of breath or trouble breathing Eye pain, redness, irritation, or discharge with blurry or decreased vision Heart muscle inflammation--unusual weakness or fatigue, shortness of breath, chest pain, fast or irregular heartbeat, dizziness, swelling of the ankles, feet, or hands Hormone gland problems--headache, sensitivity to light, unusual weakness or fatigue, dizziness, fast or irregular heartbeat, increased sensitivity to cold or heat, excessive sweating, constipation, hair loss, increased thirst or amount of urine, tremors or shaking, irritability Infusion reactions--chest pain, shortness of breath or trouble breathing, feeling faint or lightheaded Kidney injury (glomerulonephritis)--decrease in the amount of urine, red or dark brown urine, foamy or bubbly urine, swelling of the ankles, hands, or feet Liver injury--right upper belly pain, loss of appetite, nausea, light-colored stool, dark yellow or brown urine, yellowing skin or eyes, unusual weakness or fatigue Pain, tingling, or numbness in the hands or feet, muscle  weakness, change in vision, confusion or trouble speaking, loss of balance or coordination, trouble walking, seizures Rash, fever, and swollen lymph nodes Redness, blistering, peeling, or loosening of the skin, including inside the mouth Sudden or severe stomach pain, bloody diarrhea, fever, nausea, vomiting Side effects that usually do not require medical attention (report these to your care team if they continue or are  bothersome): Bone, joint, or muscle pain Diarrhea Fatigue Loss of appetite Nausea Skin rash This list may not describe all possible side effects. Call your doctor for medical advice about side effects. You may report side effects to FDA at 1-800-FDA-1088. Where should I keep my medication? This medication is given in a hospital or clinic. It will not be stored at home. NOTE: This sheet is a summary. It may not cover all possible information. If you have questions about this medicine, talk to your doctor, pharmacist, or health care provider.  2023 Elsevier/Gold Standard (2022-02-05 00:00:00)  Gemcitabine Injection What is this medication? GEMCITABINE (jem SYE ta been) treats some types of cancer. It works by slowing down the growth of cancer cells. This medicine may be used for other purposes; ask your health care provider or pharmacist if you have questions. COMMON BRAND NAME(S): Gemzar, Infugem What should I tell my care team before I take this medication? They need to know if you have any of these conditions: Blood disorders Infection Kidney disease Liver disease Lung or breathing disease, such as asthma or COPD Recent or ongoing radiation therapy An unusual or allergic reaction to gemcitabine, other medications, foods, dyes, or preservatives If you or your partner are pregnant or trying to get pregnant Breast-feeding How should I use this medication? This medication is injected into a vein. It is given by your care team in a hospital or clinic setting. Talk to your care team about the use of this medication in children. Special care may be needed. Overdosage: If you think you have taken too much of this medicine contact a poison control center or emergency room at once. NOTE: This medicine is only for you. Do not share this medicine with others. What if I miss a dose? Keep appointments for follow-up doses. It is important not to miss your dose. Call your care team if you are  unable to keep an appointment. What may interact with this medication? Interactions have not been studied. This list may not describe all possible interactions. Give your health care provider a list of all the medicines, herbs, non-prescription drugs, or dietary supplements you use. Also tell them if you smoke, drink alcohol, or use illegal drugs. Some items may interact with your medicine. What should I watch for while using this medication? Your condition will be monitored carefully while you are receiving this medication. This medication may make you feel generally unwell. This is not uncommon, as chemotherapy can affect healthy cells as well as cancer cells. Report any side effects. Continue your course of treatment even though you feel ill unless your care team tells you to stop. In some cases, you may be given additional medications to help with side effects. Follow all directions for their use. This medication may increase your risk of getting an infection. Call your care team for advice if you get a fever, chills, sore throat, or other symptoms of a cold or flu. Do not treat yourself. Try to avoid being around people who are sick. This medication may increase your risk to bruise or bleed. Call your care team if you  notice any unusual bleeding. Be careful brushing or flossing your teeth or using a toothpick because you may get an infection or bleed more easily. If you have any dental work done, tell your dentist you are receiving this medication. Avoid taking medications that contain aspirin, acetaminophen, ibuprofen, naproxen, or ketoprofen unless instructed by your care team. These medications may hide a fever. Talk to your care team if you or your partner wish to become pregnant or think you might be pregnant. This medication can cause serious birth defects if taken during pregnancy and for 6 months after the last dose. A negative pregnancy test is required before starting this medication. A  reliable form of contraception is recommended while taking this medication and for 6 months after the last dose. Talk to your care team about effective forms of contraception. Do not father a child while taking this medication and for 3 months after the last dose. Use a condom while having sex during this time period. Do not breastfeed while taking this medication and for at least 1 week after the last dose. This medication may cause infertility. Talk to your care team if you are concerned about your fertility. What side effects may I notice from receiving this medication? Side effects that you should report to your care team as soon as possible: Allergic reactions--skin rash, itching, hives, swelling of the face, lips, tongue, or throat Capillary leak syndrome--stomach or muscle pain, unusual weakness or fatigue, feeling faint or lightheaded, decrease in the amount of urine, swelling of the ankles, hands, or feet, trouble breathing Infection--fever, chills, cough, sore throat, wounds that don't heal, pain or trouble when passing urine, general feeling of discomfort or being unwell Liver injury--right upper belly pain, loss of appetite, nausea, light-colored stool, dark yellow or brown urine, yellowing skin or eyes, unusual weakness or fatigue Low red blood cell level--unusual weakness or fatigue, dizziness, headache, trouble breathing Lung injury--shortness of breath or trouble breathing, cough, spitting up blood, chest pain, fever Stomach pain, bloody diarrhea, pale skin, unusual weakness or fatigue, decrease in the amount of urine, which may be signs of hemolytic uremic syndrome Sudden and severe headache, confusion, change in vision, seizures, which may be signs of posterior reversible encephalopathy syndrome (PRES) Unusual bruising or bleeding Side effects that usually do not require medical attention (report to your care team if they continue or are bothersome): Diarrhea Drowsiness Hair  loss Nausea Pain, redness, or swelling with sores inside the mouth or throat Vomiting This list may not describe all possible side effects. Call your doctor for medical advice about side effects. You may report side effects to FDA at 1-800-FDA-1088. Where should I keep my medication? This medication is given in a hospital or clinic. It will not be stored at home. NOTE: This sheet is a summary. It may not cover all possible information. If you have questions about this medicine, talk to your doctor, pharmacist, or health care provider.  2023 Elsevier/Gold Standard (2007-12-06 00:00:00)

## 2022-12-13 NOTE — Telephone Encounter (Signed)
I attempted to contact the patient to schedule a port placement with Dr. Lucky Cowboy, a message was left for a return call.

## 2022-12-13 NOTE — Addendum Note (Signed)
Addended by: Earlie Server on: 12/13/2022 08:40 AM   Modules accepted: Orders

## 2022-12-13 NOTE — Telephone Encounter (Signed)
Dental clearance form faxed to dr. Leslye Peer office.   Fax: [phone]

## 2022-12-14 ENCOUNTER — Other Ambulatory Visit: Payer: Self-pay | Admitting: Oncology

## 2022-12-14 ENCOUNTER — Encounter: Payer: Self-pay | Admitting: Oncology

## 2022-12-14 DIAGNOSIS — C221 Intrahepatic bile duct carcinoma: Secondary | ICD-10-CM

## 2022-12-14 NOTE — Telephone Encounter (Signed)
Spoke with the patient and he is scheduled with Dr. Lucky Cowboy for a port placement on 12/24/22 with a 12:45 pm [facility]. Pre-procedure instructions were discussed and will be mailed.

## 2022-12-17 ENCOUNTER — Encounter: Payer: Self-pay | Admitting: Radiation Oncology

## 2022-12-17 ENCOUNTER — Ambulatory Visit
Admission: RE | Admit: 2022-12-17 | Discharge: 2022-12-17 | Disposition: A | Discharge status: Home / Self Care | Payer: BC Managed Care – PPO | Source: Ambulatory Visit | Attending: Radiation Oncology | Admitting: Radiation Oncology

## 2022-12-17 VITALS — BP 141/66 | HR 75 | Temp 98.2°F | Resp 20 | Ht 75.0 in | Wt 299.0 lb

## 2022-12-17 DIAGNOSIS — Z7952 Long term (current) use of systemic steroids: Secondary | ICD-10-CM | POA: Insufficient documentation

## 2022-12-17 DIAGNOSIS — E042 Nontoxic multinodular goiter: Secondary | ICD-10-CM | POA: Insufficient documentation

## 2022-12-17 DIAGNOSIS — Z86718 Personal history of other venous thrombosis and embolism: Secondary | ICD-10-CM | POA: Diagnosis not present

## 2022-12-17 DIAGNOSIS — Z79899 Other long term (current) drug therapy: Secondary | ICD-10-CM | POA: Diagnosis not present

## 2022-12-17 DIAGNOSIS — Z803 Family history of malignant neoplasm of breast: Secondary | ICD-10-CM | POA: Insufficient documentation

## 2022-12-17 DIAGNOSIS — D6861 Antiphospholipid syndrome: Secondary | ICD-10-CM | POA: Insufficient documentation

## 2022-12-17 DIAGNOSIS — C221 Intrahepatic bile duct carcinoma: Secondary | ICD-10-CM

## 2022-12-17 DIAGNOSIS — Z800 Family history of malignant neoplasm of digestive organs: Secondary | ICD-10-CM | POA: Diagnosis not present

## 2022-12-17 DIAGNOSIS — E119 Type 2 diabetes mellitus without complications: Secondary | ICD-10-CM | POA: Diagnosis not present

## 2022-12-17 DIAGNOSIS — Z7984 Long term (current) use of oral hypoglycemic drugs: Secondary | ICD-10-CM | POA: Diagnosis not present

## 2022-12-17 DIAGNOSIS — I10 Essential (primary) hypertension: Secondary | ICD-10-CM | POA: Insufficient documentation

## 2022-12-17 DIAGNOSIS — C7951 Secondary malignant neoplasm of bone: Secondary | ICD-10-CM | POA: Insufficient documentation

## 2022-12-17 DIAGNOSIS — E785 Hyperlipidemia, unspecified: Secondary | ICD-10-CM | POA: Insufficient documentation

## 2022-12-17 NOTE — Consult Note (Signed)
NEW PATIENT EVALUATION  Name: Christopher Marsh  MRN: [id number]  Date:   12/17/2022     DOB: 1961-08-04   This 62 y.o. male patient presents to the clinic for initial evaluation of bone metastasis and patient with known stage IV cholangiocarcinoma.  REFERRING PHYSICIAN: Laurey Morale, MD  CHIEF COMPLAINT:  Chief Complaint  Patient presents with   Cholangial carcinoma    DIAGNOSIS: The encounter diagnosis was Cholangiocarcinoma [facility]).   PREVIOUS INVESTIGATIONS:  PET/CT scan reviewed Pathology reports reviewed Clinical notes reviewed  HPI: Patient is a 62 year old male originally presented with stage IV (cT2 cN1c M1) intrahepatic bile duct cholangiocarcinoma. His cancer is somewhat atypical with a normal CEA and CA 19-9.  He has been treated with gemcitabine cisplatin and durvalumab which she is tolerated well.  He is really asymptomatic from bone pain although recently repeat PET CT scan showed some mild increase in size of tracer avid mass within the right lobe of the lytic liver compatible with cholangiocarcinoma.  Patient also had mixed response to bone metastasis with progressive disease in the right ischial tuberosity concerning for patent pathologic fracture.  There was also hypermetabolic activity in the T1 transverse process.  Again patient states he does have some slight difficulty with ambulation although attributes that to his weight and age.  He is now referred for consideration of palliative radiation therapy to his bone lesions. PLANNED TREATMENT REGIMEN: SBRT to T1 transverse process as well as right ischial tuberosity  PAST MEDICAL HISTORY:  has a past medical history of Antiphospholipid antibody syndrome ([place]), Arthritis, Diabetes mellitus without complication ([place]), DVT (deep venous thrombosis) ([place]), Dyspnea, Elevated lipids, Erythrocytosis, Hyperlipidemia, Hypertension, Lower extremity edema, Multinodular thyroid, and Post-thrombotic syndrome.    PAST SURGICAL  HISTORY:  Past Surgical History:  Procedure Laterality Date   COLONOSCOPY WITH PROPOFOL N/A 09/23/2018   Per Dr. Alice Reichert, polyp, repeat in 3 yrs    EYE MUSCLE SURGERY     JOINT REPLACEMENT Left 07/22/2017   TOTAL HIP ARTHROPLASTY Left 07/22/2017   Procedure: TOTAL HIP ARTHROPLASTY;  Surgeon: Dereck Leep, MD;  Location: [facility] ORS;  Service: Orthopedics;  Laterality: Left;    FAMILY HISTORY: family history includes Breast cancer in his mother; Colon cancer in his maternal grandmother; Emphysema in his father.  SOCIAL HISTORY:  reports that he quit smoking about 42 years ago. His smoking use included cigarettes. He has a 3.00 pack-year smoking history. He has never used smokeless tobacco. He reports current alcohol use. He reports that he does not use drugs.  ALLERGIES: Patient has no known allergies.  MEDICATIONS:  Current Outpatient Medications  Medication Sig Dispense Refill   albuterol (VENTOLIN HFA) 108 (90 Base) MCG/ACT inhaler Inhale 2 puffs into the lungs every 4 (four) hours as needed. 18 g 1   amLODipine (NORVASC) 10 MG tablet Take 1 tablet (10 mg total) by mouth daily. 30 tablet 0   Calcium Carb-Cholecalciferol (CALCIUM 500 + D PO) Take 1 tablet by mouth daily.     dexamethasone (DECADRON) 4 MG tablet TAKE 2 TABLETS DAILY X 3 DAYS STARTING THE DAY AFTER CISPLATIN CHEMOTHERAPY. TAKE WITH FOOD. 30 tablet 1   enoxaparin (LOVENOX) 120 MG/0.8ML injection Inject 0.8 mLs (120 mg total) into the skin every 12 (twelve) hours. 48 mL 2   glipiZIDE (GLUCOTROL) 10 MG tablet Take 1 tablet (10 mg total) by mouth 2 (two) times daily before a meal. (Patient taking differently: Take 10 mg by mouth daily before breakfast.) 180 tablet 3  glucose blood test strip 1 each by Other route as needed for other. accu chek aviva plusUse as instructed     LORazepam (ATIVAN) 0.5 MG tablet TAKE 1 TABLET (0.5 MG TOTAL) BY MOUTH EVERY 8 (EIGHT) HOURS AS NEEDED FOR ANXIETY (NAUSEA VOMITING). 60 tablet 0    magnesium oxide (MAG-OX) 400 (240 Mg) MG tablet Take 400 mg by mouth in the morning.     metFORMIN (GLUCOPHAGE) 500 MG tablet TAKE TWO TABLETS BY MOUTH EVERY MORNING WITH BREAKFAST 180 tablet 1   MUCINEX D MAX STRENGTH [phone] MG TB12 Take 1,200 mg by mouth 2 (two) times daily as needed (for congestion or to loosen mucous in the chest).     ondansetron (ZOFRAN) 8 MG tablet TAKE 1 TABLET (8 MG TOTAL) BY MOUTH EVERY 8 (EIGHT) HOURS AS NEEDED FOR NAUSEA OR VOMITING. START ON THE THIRD DAY AFTER CISPLATIN. 30 tablet 1   pravastatin (PRAVACHOL) 20 MG tablet Take 1 tablet (20 mg total) by mouth daily. 90 tablet 3   prochlorperazine (COMPAZINE) 10 MG tablet TAKE 1 TABLET (10 MG TOTAL) BY MOUTH EVERY 6 HOURS AS NEEDED FOR NAUSEA AND VOMITING 30 tablet 1   tadalafil (CIALIS) 20 MG tablet Take 0.5-1 tablets (10-20 mg total) by mouth every other day as needed for erectile dysfunction. 20 tablet 11   triamcinolone cream (KENALOG) 0.1 % Apply 1 Application topically 3 (three) times daily. (Patient taking differently: Apply 1 Application topically 3 (three) times daily as needed (for bilateral, below-the-knees skin irritation).) 453.6 g 1   Azelastine & Fluticasone 137 & 50 MCG/ACT THPK Place 1 spray into the nose daily as needed. (Patient not taking: Reported on 12/12/2022) 1 each 1   benzonatate (TESSALON) 200 MG capsule Take 1 capsule (200 mg total) by mouth 3 (three) times daily as needed for cough. (Patient not taking: Reported on 12/17/2022) 30 capsule 3   No current facility-administered medications for this encounter.    ECOG PERFORMANCE STATUS:  0 - Asymptomatic  REVIEW OF SYSTEMS: Patient denies any weight loss, fatigue, weakness, fever, chills or night sweats. Patient denies any loss of vision, blurred vision. Patient denies any ringing  of the ears or hearing loss. No irregular heartbeat. Patient denies heart murmur or history of fainting. Patient denies any chest pain or pain radiating to her upper  extremities. Patient denies any shortness of breath, difficulty breathing at night, cough or hemoptysis. Patient denies any swelling in the lower legs. Patient denies any nausea vomiting, vomiting of blood, or coffee ground material in the vomitus. Patient denies any stomach pain. Patient states has had normal bowel movements no significant constipation or diarrhea. Patient denies any dysuria, hematuria or significant nocturia. Patient denies any problems walking, swelling in the joints or loss of balance. Patient denies any skin changes, loss of hair or loss of weight. Patient denies any excessive worrying or anxiety or significant depression. Patient denies any problems with insomnia. Patient denies excessive thirst, polyuria, polydipsia. Patient denies any swollen glands, patient denies easy bruising or easy bleeding. Patient denies any recent infections, allergies or URI. Patient "s visual fields have not changed significantly in recent time.   PHYSICAL EXAM: BP (!) 141/66 (BP Location: Left Arm, Patient Position: Sitting, Cuff Size: Normal)   Pulse 75   Temp 98.2 F (36.8 C) (Tympanic)   Resp 20   Ht 6' 3"$  (1.905 m)   Wt 299 lb (135.6 kg)   BMI 37.37 kg/m  Range of motion is lower extremities does  not elicit pain.  Pelvic tilt does not elicit pain.  Deep palpation of his upper thoracic spine does not elicit pain.  Well-developed well-nourished patient in NAD. HEENT reveals PERLA, EOMI, discs not visualized.  Oral cavity is clear. No oral mucosal lesions are identified. Neck is clear without evidence of cervical or supraclavicular adenopathy. Lungs are clear to A&P. Cardiac examination is essentially unremarkable with regular rate and rhythm without murmur rub or thrill. Abdomen is benign with no organomegaly or masses noted. Motor sensory and DTR levels are equal and symmetric in the upper and lower extremities. Cranial nerves II through XII are grossly intact. Proprioception is intact. No  peripheral adenopathy or edema is identified. No motor or sensory levels are noted. Crude visual fields are within normal range.  LABORATORY DATA: Pathology reports reviewed    RADIOLOGY RESULTS: PET CT scan reviewed compatible with above-stated findings   IMPRESSION: Stage IV cholangiocarcinoma with area of T1 transverse process involvement as well as right ischial tuberosity concerning for ventral pathologic fracture in 62 year old male with cholangiocarcinoma  PLAN: This time is to go ahead with palliative radiation therapy to both sites.  Will plan on delivering 30 Gray in 5 fractions using SBRT to both areas.  Will treat them in sequence starting with his transverse process first and then going to his right ischial tuberosity after completion of that field.  Risks and benefits including extremely low side effect profile for SBRT treatment.  Were discussed with the patient.  He may have some slight dysphagia secondary to the T1 transverse process being treated in close proximity to his esophagus.  Patient comprehends my recommendations well.  I personally set up and ordered CT simulation for early next week.  He continues with his systemic chemo treatment.  I would like to take this opportunity to thank you for allowing me to participate in the care of your patient.Noreene Filbert, MD

## 2022-12-17 NOTE — H&P (View-Only) (Signed)
NEW PATIENT EVALUATION  Name: Christopher Marsh  MRN: [id number]  Date:   12/17/2022     DOB: 23-Jul-1961   This 62 y.o. male patient presents to the clinic for initial evaluation of bone metastasis and patient with known stage IV cholangiocarcinoma.  REFERRING PHYSICIAN: Laurey Morale, MD  CHIEF COMPLAINT:  Chief Complaint  Patient presents with   Cholangial carcinoma    DIAGNOSIS: The encounter diagnosis was Cholangiocarcinoma [facility]).   PREVIOUS INVESTIGATIONS:  PET/CT scan reviewed Pathology reports reviewed Clinical notes reviewed  HPI: Patient is a 62 year old male originally presented with stage IV (cT2 cN1c M1) intrahepatic bile duct cholangiocarcinoma. His cancer is somewhat atypical with a normal CEA and CA 19-9.  He has been treated with gemcitabine cisplatin and durvalumab which she is tolerated well.  He is really asymptomatic from bone pain although recently repeat PET CT scan showed some mild increase in size of tracer avid mass within the right lobe of the lytic liver compatible with cholangiocarcinoma.  Patient also had mixed response to bone metastasis with progressive disease in the right ischial tuberosity concerning for patent pathologic fracture.  There was also hypermetabolic activity in the T1 transverse process.  Again patient states he does have some slight difficulty with ambulation although attributes that to his weight and age.  He is now referred for consideration of palliative radiation therapy to his bone lesions. PLANNED TREATMENT REGIMEN: SBRT to T1 transverse process as well as right ischial tuberosity  PAST MEDICAL HISTORY:  has a past medical history of Antiphospholipid antibody syndrome ([place]), Arthritis, Diabetes mellitus without complication ([place]), DVT (deep venous thrombosis) ([place]), Dyspnea, Elevated lipids, Erythrocytosis, Hyperlipidemia, Hypertension, Lower extremity edema, Multinodular thyroid, and Post-thrombotic syndrome.    PAST SURGICAL  HISTORY:  Past Surgical History:  Procedure Laterality Date   COLONOSCOPY WITH PROPOFOL N/A 09/23/2018   Per Dr. Alice Reichert, polyp, repeat in 3 yrs    EYE MUSCLE SURGERY     JOINT REPLACEMENT Left 07/22/2017   TOTAL HIP ARTHROPLASTY Left 07/22/2017   Procedure: TOTAL HIP ARTHROPLASTY;  Surgeon: Dereck Leep, MD;  Location: [facility] ORS;  Service: Orthopedics;  Laterality: Left;    FAMILY HISTORY: family history includes Breast cancer in his mother; Colon cancer in his maternal grandmother; Emphysema in his father.  SOCIAL HISTORY:  reports that he quit smoking about 42 years ago. His smoking use included cigarettes. He has a 3.00 pack-year smoking history. He has never used smokeless tobacco. He reports current alcohol use. He reports that he does not use drugs.  ALLERGIES: Patient has no known allergies.  MEDICATIONS:  Current Outpatient Medications  Medication Sig Dispense Refill   albuterol (VENTOLIN HFA) 108 (90 Base) MCG/ACT inhaler Inhale 2 puffs into the lungs every 4 (four) hours as needed. 18 g 1   amLODipine (NORVASC) 10 MG tablet Take 1 tablet (10 mg total) by mouth daily. 30 tablet 0   Calcium Carb-Cholecalciferol (CALCIUM 500 + D PO) Take 1 tablet by mouth daily.     dexamethasone (DECADRON) 4 MG tablet TAKE 2 TABLETS DAILY X 3 DAYS STARTING THE DAY AFTER CISPLATIN CHEMOTHERAPY. TAKE WITH FOOD. 30 tablet 1   enoxaparin (LOVENOX) 120 MG/0.8ML injection Inject 0.8 mLs (120 mg total) into the skin every 12 (twelve) hours. 48 mL 2   glipiZIDE (GLUCOTROL) 10 MG tablet Take 1 tablet (10 mg total) by mouth 2 (two) times daily before a meal. (Patient taking differently: Take 10 mg by mouth daily before breakfast.) 180 tablet 3  glucose blood test strip 1 each by Other route as needed for other. accu chek aviva plusUse as instructed     LORazepam (ATIVAN) 0.5 MG tablet TAKE 1 TABLET (0.5 MG TOTAL) BY MOUTH EVERY 8 (EIGHT) HOURS AS NEEDED FOR ANXIETY (NAUSEA VOMITING). 60 tablet 0    magnesium oxide (MAG-OX) 400 (240 Mg) MG tablet Take 400 mg by mouth in the morning.     metFORMIN (GLUCOPHAGE) 500 MG tablet TAKE TWO TABLETS BY MOUTH EVERY MORNING WITH BREAKFAST 180 tablet 1   MUCINEX D MAX STRENGTH [phone] MG TB12 Take 1,200 mg by mouth 2 (two) times daily as needed (for congestion or to loosen mucous in the chest).     ondansetron (ZOFRAN) 8 MG tablet TAKE 1 TABLET (8 MG TOTAL) BY MOUTH EVERY 8 (EIGHT) HOURS AS NEEDED FOR NAUSEA OR VOMITING. START ON THE THIRD DAY AFTER CISPLATIN. 30 tablet 1   pravastatin (PRAVACHOL) 20 MG tablet Take 1 tablet (20 mg total) by mouth daily. 90 tablet 3   prochlorperazine (COMPAZINE) 10 MG tablet TAKE 1 TABLET (10 MG TOTAL) BY MOUTH EVERY 6 HOURS AS NEEDED FOR NAUSEA AND VOMITING 30 tablet 1   tadalafil (CIALIS) 20 MG tablet Take 0.5-1 tablets (10-20 mg total) by mouth every other day as needed for erectile dysfunction. 20 tablet 11   triamcinolone cream (KENALOG) 0.1 % Apply 1 Application topically 3 (three) times daily. (Patient taking differently: Apply 1 Application topically 3 (three) times daily as needed (for bilateral, below-the-knees skin irritation).) 453.6 g 1   Azelastine & Fluticasone 137 & 50 MCG/ACT THPK Place 1 spray into the nose daily as needed. (Patient not taking: Reported on 12/12/2022) 1 each 1   benzonatate (TESSALON) 200 MG capsule Take 1 capsule (200 mg total) by mouth 3 (three) times daily as needed for cough. (Patient not taking: Reported on 12/17/2022) 30 capsule 3   No current facility-administered medications for this encounter.    ECOG PERFORMANCE STATUS:  0 - Asymptomatic  REVIEW OF SYSTEMS: Patient denies any weight loss, fatigue, weakness, fever, chills or night sweats. Patient denies any loss of vision, blurred vision. Patient denies any ringing  of the ears or hearing loss. No irregular heartbeat. Patient denies heart murmur or history of fainting. Patient denies any chest pain or pain radiating to her upper  extremities. Patient denies any shortness of breath, difficulty breathing at night, cough or hemoptysis. Patient denies any swelling in the lower legs. Patient denies any nausea vomiting, vomiting of blood, or coffee ground material in the vomitus. Patient denies any stomach pain. Patient states has had normal bowel movements no significant constipation or diarrhea. Patient denies any dysuria, hematuria or significant nocturia. Patient denies any problems walking, swelling in the joints or loss of balance. Patient denies any skin changes, loss of hair or loss of weight. Patient denies any excessive worrying or anxiety or significant depression. Patient denies any problems with insomnia. Patient denies excessive thirst, polyuria, polydipsia. Patient denies any swollen glands, patient denies easy bruising or easy bleeding. Patient denies any recent infections, allergies or URI. Patient "s visual fields have not changed significantly in recent time.   PHYSICAL EXAM: BP (!) 141/66 (BP Location: Left Arm, Patient Position: Sitting, Cuff Size: Normal)   Pulse 75   Temp 98.2 F (36.8 C) (Tympanic)   Resp 20   Ht '6\' 3"'$  (1.905 m)   Wt 299 lb (135.6 kg)   BMI 37.37 kg/m  Range of motion is lower extremities does  not elicit pain.  Pelvic tilt does not elicit pain.  Deep palpation of his upper thoracic spine does not elicit pain.  Well-developed well-nourished patient in NAD. HEENT reveals PERLA, EOMI, discs not visualized.  Oral cavity is clear. No oral mucosal lesions are identified. Neck is clear without evidence of cervical or supraclavicular adenopathy. Lungs are clear to A&P. Cardiac examination is essentially unremarkable with regular rate and rhythm without murmur rub or thrill. Abdomen is benign with no organomegaly or masses noted. Motor sensory and DTR levels are equal and symmetric in the upper and lower extremities. Cranial nerves II through XII are grossly intact. Proprioception is intact. No  peripheral adenopathy or edema is identified. No motor or sensory levels are noted. Crude visual fields are within normal range.  LABORATORY DATA: Pathology reports reviewed    RADIOLOGY RESULTS: PET CT scan reviewed compatible with above-stated findings   IMPRESSION: Stage IV cholangiocarcinoma with area of T1 transverse process involvement as well as right ischial tuberosity concerning for ventral pathologic fracture in 62 year old male with cholangiocarcinoma  PLAN: This time is to go ahead with palliative radiation therapy to both sites.  Will plan on delivering 30 Gray in 5 fractions using SBRT to both areas.  Will treat them in sequence starting with his transverse process first and then going to his right ischial tuberosity after completion of that field.  Risks and benefits including extremely low side effect profile for SBRT treatment.  Were discussed with the patient.  He may have some slight dysphagia secondary to the T1 transverse process being treated in close proximity to his esophagus.  Patient comprehends my recommendations well.  I personally set up and ordered CT simulation for early next week.  He continues with his systemic chemo treatment.  I would like to take this opportunity to thank you for allowing me to participate in the care of your patient.Noreene Filbert, MD

## 2022-12-19 MED FILL — Dexamethasone Sodium Phosphate Inj 100 MG/10ML: INTRAMUSCULAR | Qty: 1 | Status: AC

## 2022-12-19 MED FILL — Fosaprepitant Dimeglumine For IV Infusion 150 MG (Base Eq): INTRAVENOUS | Qty: 5 | Status: AC

## 2022-12-20 ENCOUNTER — Ambulatory Visit: Payer: BC Managed Care – PPO

## 2022-12-20 ENCOUNTER — Other Ambulatory Visit: Payer: Self-pay | Admitting: Oncology

## 2022-12-20 ENCOUNTER — Inpatient Hospital Stay: Payer: BC Managed Care – PPO

## 2022-12-20 VITALS — BP 153/70 | HR 95 | Temp 98.3°F | Resp 18 | Wt 289.1 lb

## 2022-12-20 DIAGNOSIS — C221 Intrahepatic bile duct carcinoma: Secondary | ICD-10-CM

## 2022-12-20 DIAGNOSIS — C7951 Secondary malignant neoplasm of bone: Secondary | ICD-10-CM | POA: Diagnosis not present

## 2022-12-20 DIAGNOSIS — Z5112 Encounter for antineoplastic immunotherapy: Secondary | ICD-10-CM | POA: Diagnosis not present

## 2022-12-20 DIAGNOSIS — Z79899 Other long term (current) drug therapy: Secondary | ICD-10-CM | POA: Diagnosis not present

## 2022-12-20 DIAGNOSIS — M79602 Pain in left arm: Secondary | ICD-10-CM | POA: Diagnosis not present

## 2022-12-20 DIAGNOSIS — R918 Other nonspecific abnormal finding of lung field: Secondary | ICD-10-CM | POA: Diagnosis not present

## 2022-12-20 DIAGNOSIS — J310 Chronic rhinitis: Secondary | ICD-10-CM | POA: Diagnosis not present

## 2022-12-20 DIAGNOSIS — D6861 Antiphospholipid syndrome: Secondary | ICD-10-CM | POA: Diagnosis not present

## 2022-12-20 DIAGNOSIS — Z7901 Long term (current) use of anticoagulants: Secondary | ICD-10-CM | POA: Diagnosis not present

## 2022-12-20 DIAGNOSIS — Z5111 Encounter for antineoplastic chemotherapy: Secondary | ICD-10-CM | POA: Diagnosis not present

## 2022-12-20 DIAGNOSIS — Z79620 Long term (current) use of immunosuppressive biologic: Secondary | ICD-10-CM | POA: Diagnosis not present

## 2022-12-20 LAB — COMPREHENSIVE METABOLIC PANEL
ALT: 35 U/L (ref 0–44)
AST: 32 U/L (ref 15–41)
Albumin: 3.6 g/dL (ref 3.5–5.0)
Alkaline Phosphatase: 64 U/L (ref 38–126)
Anion gap: 12 (ref 5–15)
BUN: 8 mg/dL (ref 8–23)
CO2: 23 mmol/L (ref 22–32)
Calcium: 9.4 mg/dL (ref 8.9–10.3)
Chloride: 100 mmol/L (ref 98–111)
Creatinine, Ser: 0.76 mg/dL (ref 0.61–1.24)
GFR, Estimated: >60 mL/min (ref >60)
Glucose, Bld: 191 mg/dL — ABNORMAL HIGH (ref 70–99)
Potassium: 3.5 mmol/L (ref 3.5–5.1)
Sodium: 135 mmol/L (ref 135–145)
Total Bilirubin: 0.5 mg/dL (ref 0.3–1.2)
Total Protein: 6.3 g/dL — ABNORMAL LOW (ref 6.5–8.1)

## 2022-12-20 LAB — CBC WITH DIFFERENTIAL/PLATELET
Abs Immature Granulocytes: 0.3 10*3/uL — ABNORMAL HIGH (ref 0.00–0.07)
Basophils Absolute: 0.1 10*3/uL (ref 0.0–0.1)
Basophils Relative: 2 %
Eosinophils Absolute: 0 10*3/uL (ref 0.0–0.5)
Eosinophils Relative: 0 %
HCT: 34.9 % — ABNORMAL LOW (ref 39.0–52.0)
Hemoglobin: 11.9 g/dL — ABNORMAL LOW (ref 13.0–17.0)
Immature Granulocytes: 10 %
Lymphocytes Relative: 9 %
Lymphs Abs: 0.3 10*3/uL — ABNORMAL LOW (ref 0.7–4.0)
MCH: 31.2 pg (ref 26.0–34.0)
MCHC: 34.1 g/dL (ref 30.0–36.0)
MCV: 91.6 fL (ref 80.0–100.0)
Monocytes Absolute: 0.4 10*3/uL (ref 0.1–1.0)
Monocytes Relative: 12 %
Neutro Abs: 2.1 10*3/uL (ref 1.7–7.7)
Neutrophils Relative %: 67 %
Platelets: 269 10*3/uL (ref 150–400)
RBC: 3.81 MIL/uL — ABNORMAL LOW (ref 4.22–5.81)
RDW: 15.3 % (ref 11.5–15.5)
Smear Review: NORMAL
WBC: 3.1 10*3/uL — ABNORMAL LOW (ref 4.0–10.5)
nRBC: 1.6 % — ABNORMAL HIGH (ref 0.0–0.2)

## 2022-12-20 LAB — MAGNESIUM: Magnesium: 1.4 mg/dL — ABNORMAL LOW (ref 1.7–2.4)

## 2022-12-20 MED ORDER — SODIUM CHLORIDE 0.9 % IV SOLN
1000.0000 mg/m2 | Freq: Once | INTRAVENOUS | Status: AC
  Administered 2022-12-20: 2662 mg via INTRAVENOUS
  Filled 2022-12-20: qty 52.57

## 2022-12-20 MED ORDER — SODIUM CHLORIDE 0.9 % IV SOLN
Freq: Once | INTRAVENOUS | Status: DC
  Filled 2022-12-20: qty 250

## 2022-12-20 MED ORDER — SODIUM CHLORIDE 0.9 % IV SOLN
Freq: Once | INTRAVENOUS | Status: AC
  Filled 2022-12-20: qty 250

## 2022-12-20 MED ORDER — MAGNESIUM CHLORIDE 64 MG PO TBEC: 1.0000 | DELAYED_RELEASE_TABLET | Freq: Every day | ORAL | 3 refills | Status: DC

## 2022-12-20 MED ORDER — MAGNESIUM SULFATE 4 GM/100ML IV SOLN
4.0000 g | Freq: Once | INTRAVENOUS | Status: AC
  Administered 2022-12-20: 4 g via INTRAVENOUS
  Filled 2022-12-20: qty 100

## 2022-12-20 MED ORDER — SODIUM CHLORIDE 0.9 % IV SOLN
10.0000 mg | Freq: Once | INTRAVENOUS | Status: AC
  Administered 2022-12-20: 10 mg via INTRAVENOUS
  Filled 2022-12-20: qty 10

## 2022-12-20 MED ORDER — SODIUM CHLORIDE 0.9 % IV SOLN
25.0000 mg/m2 | Freq: Once | INTRAVENOUS | Status: AC
  Administered 2022-12-20: 66 mg via INTRAVENOUS
  Filled 2022-12-20: qty 66

## 2022-12-20 MED ORDER — SODIUM CHLORIDE 0.9 % IV SOLN
150.0000 mg | Freq: Once | INTRAVENOUS | Status: AC
  Administered 2022-12-20: 150 mg via INTRAVENOUS
  Filled 2022-12-20: qty 150

## 2022-12-20 MED ORDER — PALONOSETRON HCL INJECTION 0.25 MG/5ML
0.2500 mg | Freq: Once | INTRAVENOUS | Status: AC
  Administered 2022-12-20: 0.25 mg via INTRAVENOUS
  Filled 2022-12-20: qty 5

## 2022-12-20 MED ORDER — POTASSIUM CHLORIDE IN NACL 20-0.9 MEQ/L-% IV SOLN
Freq: Once | INTRAVENOUS | Status: AC
  Filled 2022-12-20: qty 1000

## 2022-12-20 NOTE — Progress Notes (Signed)
Pt reports PIV burning during Gemzar infusion. PIV flushed well, no pain with NS flush, blood return present, no swelling noted. NS increased to 36m/hr, heat applied. Gemzar rate decreased to 500cc/hr. Pt reports improvement.   Dr. YTasia Catchingsand pharmacy made aware.    1449: Pt reports burning in PIV has resolved, since Gemzar completion.   Checked blood return throughout treatment. *see flowsheets*   Per Dr. YTasia Catchingsokay to run post Cisplatin, Potassium hydration fluids with the Cisplatin.

## 2022-12-20 NOTE — Patient Instructions (Signed)
[facility]  Discharge Instructions: Thank you for choosing [facility] to provide your oncology and hematology care.  If you have a lab appointment with the [facility], please go directly to the [facility] and check in at the registration area.  Wear comfortable clothing and clothing appropriate for easy access to any Portacath or PICC line.   We strive to give you quality time with your provider. You may need to reschedule your appointment if you arrive late (15 or more minutes).  Arriving late affects you and other patients whose appointments are after yours.  Also, if you miss three or more appointments without notifying the office, you may be dismissed from the clinic at the provider's discretion.      For prescription refill requests, have your pharmacy contact our office and allow 72 hours for refills to be completed.    Today you received the following chemotherapy and/or immunotherapy agents Cisplatin and Gemzar      To help prevent nausea and vomiting after your treatment, we encourage you to take your nausea medication as directed.  BELOW ARE SYMPTOMS THAT SHOULD BE REPORTED IMMEDIATELY: *FEVER GREATER THAN 100.4 F (38 C) OR HIGHER *CHILLS OR SWEATING *NAUSEA AND VOMITING THAT IS NOT CONTROLLED WITH YOUR NAUSEA MEDICATION *UNUSUAL SHORTNESS OF BREATH *UNUSUAL BRUISING OR BLEEDING *URINARY PROBLEMS (pain or burning when urinating, or frequent urination) *BOWEL PROBLEMS (unusual diarrhea, constipation, pain near the anus) TENDERNESS IN MOUTH AND THROAT WITH OR WITHOUT PRESENCE OF ULCERS (sore throat, sores in mouth, or a toothache) UNUSUAL RASH, SWELLING OR PAIN  UNUSUAL VAGINAL DISCHARGE OR ITCHING   Items with * indicate a potential emergency and should be followed up as soon as possible or go to the Emergency Department if any problems should occur.  Please show the CHEMOTHERAPY ALERT CARD or IMMUNOTHERAPY ALERT CARD at  check-in to the Emergency Department and triage nurse.  Should you have questions after your visit or need to cancel or reschedule your appointment, please contact [facility]  [phone] and follow the prompts.  Office hours are 8:00 a.m. to 4:30 p.m. Monday - Friday. Please note that voicemails left after 4:00 p.m. may not be returned until the following business day.  We are closed weekends and major holidays. You have access to a nurse at all times for urgent questions. Please call the main number to the clinic [phone] and follow the prompts.  For any non-urgent questions, you may also contact your provider using MyChart. We now offer e-Visits for anyone 38 and older to request care online for non-urgent symptoms. For details visit [email or web address].   Also download the MyChart app! Go to the app store, search "MyChart", open the app, select [facility], and log in with your MyChart username and password.

## 2022-12-21 ENCOUNTER — Telehealth (INDEPENDENT_AMBULATORY_CARE_PROVIDER_SITE_OTHER): Payer: Self-pay

## 2022-12-21 ENCOUNTER — Encounter (INDEPENDENT_AMBULATORY_CARE_PROVIDER_SITE_OTHER): Payer: Self-pay | Admitting: Vascular Surgery

## 2022-12-21 NOTE — Telephone Encounter (Addendum)
Spoke with the patient to have him come in sooner for his procedure with Dr. Lucky Cowboy on 12/24/22. Patient stated he would be okay with coming in at 6:45 am for his procedure with Dr. Lucky Cowboy at the [facility]. Patient asked about his Lovenox and stated that it was okay to leave a message regarding this. I called the patient back and advised per Dr. Lucky Cowboy to not take his Lovenox the morning of the procedure.

## 2022-12-24 ENCOUNTER — Encounter: Payer: Self-pay | Admitting: Oncology

## 2022-12-24 ENCOUNTER — Telehealth: Payer: Self-pay

## 2022-12-24 ENCOUNTER — Other Ambulatory Visit: Payer: Self-pay

## 2022-12-24 ENCOUNTER — Ambulatory Visit
Admission: RE | Admit: 2022-12-24 | Discharge: 2022-12-24 | Disposition: A | Discharge status: Home / Self Care | Payer: BC Managed Care – PPO | Source: Ambulatory Visit | Attending: Vascular Surgery | Admitting: Vascular Surgery

## 2022-12-24 ENCOUNTER — Other Ambulatory Visit: Payer: Self-pay | Admitting: Oncology

## 2022-12-24 ENCOUNTER — Encounter
Admission: RE | Disposition: A | Discharge status: Home / Self Care | Payer: Self-pay | Source: Ambulatory Visit | Attending: Vascular Surgery

## 2022-12-24 ENCOUNTER — Encounter: Payer: Self-pay | Admitting: Vascular Surgery

## 2022-12-24 DIAGNOSIS — C221 Intrahepatic bile duct carcinoma: Secondary | ICD-10-CM | POA: Insufficient documentation

## 2022-12-24 DIAGNOSIS — C7951 Secondary malignant neoplasm of bone: Secondary | ICD-10-CM | POA: Diagnosis not present

## 2022-12-24 HISTORY — DX: Malignant (primary) neoplasm, unspecified: C80.1

## 2022-12-24 HISTORY — PX: PORTA CATH INSERTION: CATH118285

## 2022-12-24 LAB — GLUCOSE, CAPILLARY: Glucose-Capillary: 89 mg/dL (ref 70–99)

## 2022-12-24 SURGERY — PORTA CATH INSERTION
Anesthesia: Moderate Sedation

## 2022-12-24 MED ORDER — FAMOTIDINE 20 MG PO TABS: 40.0000 mg | ORAL_TABLET | Freq: Once | ORAL | Status: DC | PRN

## 2022-12-24 MED ORDER — FENTANYL CITRATE (PF) 100 MCG/2ML IJ SOLN
INTRAMUSCULAR | Status: AC
  Filled 2022-12-24: qty 2

## 2022-12-24 MED ORDER — CEFAZOLIN SODIUM-DEXTROSE 2-4 GM/100ML-% IV SOLN: 2.0000 g | INTRAVENOUS | Status: AC

## 2022-12-24 MED ORDER — SODIUM CHLORIDE 0.9 % IV SOLN: INTRAVENOUS | Status: DC

## 2022-12-24 MED ORDER — CEFAZOLIN SODIUM-DEXTROSE 2-4 GM/100ML-% IV SOLN
INTRAVENOUS | Status: AC
  Administered 2022-12-24: 2 g via INTRAVENOUS
  Filled 2022-12-24: qty 100

## 2022-12-24 MED ORDER — ONDANSETRON HCL 4 MG/2ML IJ SOLN: 4.0000 mg | Freq: Four times a day (QID) | INTRAMUSCULAR | Status: DC | PRN

## 2022-12-24 MED ORDER — LIDOCAINE-PRILOCAINE 2.5-2.5 % EX CREA: 1.0000 | TOPICAL_CREAM | CUTANEOUS | 5 refills | Status: DC | PRN

## 2022-12-24 MED ORDER — HYDROMORPHONE HCL 1 MG/ML IJ SOLN: 1.0000 mg | Freq: Once | INTRAMUSCULAR | Status: DC | PRN

## 2022-12-24 MED ORDER — MIDAZOLAM HCL 2 MG/2ML IJ SOLN
INTRAMUSCULAR | Status: DC | PRN
  Administered 2022-12-24: 2 mg via INTRAVENOUS

## 2022-12-24 MED ORDER — FENTANYL CITRATE (PF) 100 MCG/2ML IJ SOLN
INTRAMUSCULAR | Status: DC | PRN
  Administered 2022-12-24 (×2): 50 ug via INTRAVENOUS

## 2022-12-24 MED ORDER — MIDAZOLAM HCL 2 MG/ML PO SYRP: 8.0000 mg | ORAL_SOLUTION | Freq: Once | ORAL | Status: DC | PRN

## 2022-12-24 MED ORDER — DIPHENHYDRAMINE HCL 50 MG/ML IJ SOLN: 50.0000 mg | Freq: Once | INTRAMUSCULAR | Status: DC | PRN

## 2022-12-24 MED ORDER — SODIUM CHLORIDE 0.9 % IV SOLN
80.0000 mg | Freq: Once | INTRAVENOUS | Status: AC
  Administered 2022-12-24: 80 mg
  Filled 2022-12-24: qty 2

## 2022-12-24 MED ORDER — MIDAZOLAM HCL 2 MG/2ML IJ SOLN
INTRAMUSCULAR | Status: AC
  Filled 2022-12-24: qty 2

## 2022-12-24 MED ORDER — METHYLPREDNISOLONE SODIUM SUCC 125 MG IJ SOLR: 125.0000 mg | Freq: Once | INTRAMUSCULAR | Status: DC | PRN

## 2022-12-24 SURGICAL SUPPLY — 12 items
ADH SKN CLS APL DERMABOND .7 (GAUZE/BANDAGES/DRESSINGS) ×1
COVER PROBE ULTRASOUND 5X96 (MISCELLANEOUS) IMPLANT
DERMABOND ADVANCED .7 DNX12 (GAUZE/BANDAGES/DRESSINGS) IMPLANT
HANDLE YANKAUER SUCT BULB TIP (MISCELLANEOUS) IMPLANT
KIT PORT POWER 8FR ISP CVUE (Port) IMPLANT
PACK ANGIOGRAPHY (CUSTOM PROCEDURE TRAY) ×1 IMPLANT
PENCIL ELECTRO HAND CTR (MISCELLANEOUS) IMPLANT
SPONGE XRAY 4X4 16PLY STRL (MISCELLANEOUS) IMPLANT
SUT MNCRL AB 4-0 PS2 18 (SUTURE) IMPLANT
SUT VIC AB 3-0 SH 27 (SUTURE) ×1
SUT VIC AB 3-0 SH 27X BRD (SUTURE) IMPLANT
TUBING CONNECTING 10 (TUBING) IMPLANT

## 2022-12-24 NOTE — Op Note (Signed)
      [facility]       Operative Note  Date: 12/24/2022  Preoperative diagnosis:  1. cholangiocarcinoma  Postoperative diagnosis:  Same as above  Procedures: #1. Ultrasound guidance for vascular access to the right internal jugular vein. #2. Fluoroscopic guidance for placement of catheter. #3. Placement of CT compatible Port-A-Cath, right internal jugular vein.  Surgeon: [name], MD.   Anesthesia: Local with moderate conscious sedation for approximately 22  minutes using 2 mg of Versed and 100 mcg of Fentanyl  Fluoroscopy time: less than 1 minute  Contrast used: 0  Estimated blood loss: 3 cc  Indication for the procedure:  The patient is a 62 y.o.male with cholangiocarcinoma.  The patient needs a Port-A-Cath for durable venous access, chemotherapy, lab draws, and CT scans. We are asked to place this. Risks and benefits were discussed and informed consent was obtained.  Description of procedure: The patient was brought to the vascular and interventional radiology suite.  Moderate conscious sedation was administered throughout the procedure during a face to face encounter with the patient with my supervision of the RN administering medicines and monitoring the patient's vital signs, pulse oximetry, telemetry and mental status throughout from the start of the procedure until the patient was taken to the recovery room. The right neck chest and shoulder were sterilely prepped and draped, and a sterile surgical field was created. Ultrasound was used to help visualize a patent right internal jugular vein. This was then accessed under direct ultrasound guidance without difficulty with the Seldinger needle and a permanent image was recorded. A J-wire was placed. After skin nick and dilatation, the peel-away sheath was then placed over the wire. I then anesthetized an area under the clavicle approximately 1-2 fingerbreadths. A transverse incision was created and an inferior  pocket was created with electrocautery and blunt dissection. The port was then brought onto the field, placed into the pocket and secured to the chest wall with 2 Prolene sutures. The catheter was connected to the port and tunneled from the subclavicular incision to the access site. Fluoroscopic guidance was then used to cut the catheter to an appropriate length. The catheter was then placed through the peel-away sheath and the peel-away sheath was removed. The catheter tip was parked in excellent location under fluorocoscopic guidance in the cavoatrial junction. The pocket was then irrigated with antibiotic impregnated saline and the wound was closed with a running 3-0 Vicryl and a 4-0 Monocryl. The access incision was closed with a single 4-0 Monocryl. The Huber needle was used to withdraw blood and flush the port with heparinized saline. Dermabond was then placed as a dressing. The patient tolerated the procedure well and was taken to the recovery room in stable condition.   [name] 12/24/2022 8:34 AM   This note was created with Dragon Medical transcription system. Any errors in dictation are purely unintentional.

## 2022-12-24 NOTE — Interval H&P Note (Signed)
History and Physical Interval Note:  12/24/2022 7:58 AM  Christopher Marsh  has presented today for surgery, with the diagnosis of Porta Cath Placement   Cholangiocarcinoma.  The various methods of treatment have been discussed with the patient and family. After consideration of risks, benefits and other options for treatment, the patient has consented to  Procedure(s): PORTA CATH INSERTION (N/A) as a surgical intervention.  The patient's history has been reviewed, patient examined, no change in status, stable for surgery.  I have reviewed the patient's chart and labs.  Questions were answered to the patient's satisfaction.     [name]

## 2022-12-24 NOTE — Telephone Encounter (Signed)
Called Dr. Eliezer Mccoy office and spoke to rep. Per rep, they spoke to pt and informed him that he needed to be seen first as there are areas that they would like to take care of before. They are waiting for pt to call back and set up appt.

## 2022-12-24 NOTE — Telephone Encounter (Signed)
-----   Message from Secundino Ginger sent at 12/24/2022  9:39 AM EST ----- Regarding: [place] sent to his chart for Lidocaine.

## 2022-12-24 NOTE — Telephone Encounter (Signed)
PA for Lidocaine-prilocaine submitted via covermymeds Key: [id number] PA Case ID: [id number]

## 2022-12-25 ENCOUNTER — Encounter: Payer: Self-pay | Admitting: Nurse Practitioner

## 2022-12-25 ENCOUNTER — Inpatient Hospital Stay (HOSPITAL_BASED_OUTPATIENT_CLINIC_OR_DEPARTMENT_OTHER): Payer: BC Managed Care – PPO | Admitting: Nurse Practitioner

## 2022-12-25 ENCOUNTER — Ambulatory Visit
Admission: RE | Admit: 2022-12-25 | Discharge: 2022-12-25 | Disposition: A | Discharge status: Home / Self Care | Payer: BC Managed Care – PPO | Source: Ambulatory Visit | Attending: Radiation Oncology | Admitting: Radiation Oncology

## 2022-12-25 VITALS — BP 126/60 | HR 75 | Temp 98.6°F | Resp 18 | Ht 75.0 in | Wt 280.0 lb

## 2022-12-25 DIAGNOSIS — M79602 Pain in left arm: Secondary | ICD-10-CM

## 2022-12-25 DIAGNOSIS — C7951 Secondary malignant neoplasm of bone: Secondary | ICD-10-CM | POA: Diagnosis not present

## 2022-12-25 DIAGNOSIS — C221 Intrahepatic bile duct carcinoma: Secondary | ICD-10-CM | POA: Insufficient documentation

## 2022-12-25 NOTE — Progress Notes (Signed)
[facility] [place]. [facility] [address], [place], [place] [address] [phone] (phone) [phone] (fax)  Patient Care Team: Laurey Morale, MD as PCP - General (Family Medicine) Janina Mayo, MD as PCP - Cardiology (Cardiology) Earlie Server, MD as Consulting Physician (Hematology and Oncology) Kris Hartmann, NP as Nurse Practitioner (Vascular Surgery) Clent Jacks, RN as Oncology Nurse Navigator   Name of the patient: Christopher Marsh  [id number]  1961-06-21   Date of visit: 12/25/22  Diagnosis- Cholangiocarcinoma  Chief complaint/ Reason for visit- arm pain  Heme/Onc history:  Oncology History  Cholangiocarcinoma ([place])  10/30/2021 Imaging   CT chest wo contrast 1. Heterogeneous bilateral ground-glass disease, multilobar but worst in the right middle lobe, lingula and lower lobes. There is progression of the abnormality since 10/12/2022. Though pneumonia could produce this appearance, given persistence and fluctuating appearance since October, consider alternative entities such as hypersensitivity pneumonitis or idiopathic interstitial pneumonia. 2. Slightly enlarged pulmonary trunk, possible arterial hypertension 3. Redemonstrated large hypodense right hepatic lobe mass.   08/18/2022 Imaging   CT chest w contrast showed 1. Multifocal airspace disease in both lungs, left side greater than right. Findings are most compatible with multifocal pneumonia. 2. Multiple new hepatic lesions with enlarging lymph nodes in the upper abdomen and chest. Findings are concerning for metastatic disease. Based on the multifocal pneumonia, hepatic abscesses would also be in the differential diagnosis. Recommend further characterization of the abdomen and pelvis with CT with IV contrast. 3. Enlargement of the main pulmonary artery could be associated with pulmonary  hypertension. 4. Coronary artery calcifications. 5. Multinodular goiter. Patient has known thyroid nodules and previous thyroid ultrasound.    08/18/2022 Imaging   CT abdomen pelvis w contrast  Interval development of 6.5 x 5.2 cm heterogeneously enhancing mass in right hepatic lobe. This is highly concerning for neoplasm or malignancy, and further evaluation with MRI is recommended.   Mildly enlarged periaortic adenopathy is noted, with the largest lymph node measuring 12 mm. Metastatic disease cannot be excluded. Mildly enlarged adenopathy is also noted in the gastrohepatic ligament, but this is unchanged compared to prior exam.   Stable bibasilar lung opacities are noted concerning for inflammation, atelectasis or possibly scarring.   Grossly stable multi-septated cystic lesion is seen involving upper pole of right kidney with peripheral calcifications compared to prior exam of 2019, most likely representing benign etiology.   Aortic Atherosclerosis    08/18/2022 - 08/21/2022 Hospital Admission   Admitted due to multifocal pneumonia, treated with Influenza panel negative, COVID-negative, HIV negative, Complete course of antibiotics with Zithromax, Vantin     08/19/2022 Imaging   MR abdomen w wo contrast  Marked caudate lobe hypertrophy, highly suspicious for hepatic cirrhosis.   Numerous hypervascular masses throughout the right hepatic lobe, highly suspicious for multifocal hepatocellular carcinoma. Recommend correlation with AFP and consider tissue sampling.   Mild abdominal lymphadenopathy, with differential diagnosis including metastatic disease and reactive lymphadenopathy in setting of cirrhosis.   Bilateral lower lobe infiltrates, as better demonstrated on recent CT.    08/19/2022 Tumor Marker   AFP 4.1   08/27/2022 Initial Diagnosis   Cholangiocarcinoma  -08/20/22 Liver mass biopsy showed poorly differentiated carcinoma Immunohistochemical stains show that the poorly  differentiated carcinoma is positive for CK7 and MOC-31, suggestive of a poorly differentiated adenocarcinoma.  Immunohistochemical stains for CK20, CDX2, HepPar 1 and arginase are negative.  Immunostain for glypican-3 shows very focal  likely nonspecific labeling.  This immunoprofile is nonspecific but can be compatible with a poorly differentiated primary cholangiocarcinoma in absence of any other lesions.   NGS showed BAP 1 mutation.  Case was presented at tumor board, I have also discussed with pathologist Dr.Rubinas,  IHC pattern is more consistent with adenocarcinoma, unlikely [place] [due to negative HepPar 1 and arginase], cholangiocarcinoma is favored, although this is a diagnosis of exclusion. Second opinion ar [facility]   08/27/2022 Cancer Staging   Staging form: Intrahepatic Bile Duct, AJCC 8th Edition - Clinical stage from 08/27/2022: Stage IV (cT2, cN1, cM1) - Signed by Earlie Server, MD on 09/13/2022 Stage prefix: Initial diagnosis   08/27/2022 Tumor Marker   CA 19.9  16 CEA 2.8   09/12/2022 Imaging   PET scan showed 1. Large right hepatic lobe mass is hypermetabolic and consistent with known cholangiocarcinoma. 2. Scattered borderline enlarged upper abdominal lymph nodes do not show any significant FDG uptake. 3. 4 hypermetabolic bone lesions consistent with metastatic disease. 4. Progressive diffuse airspace process in the left lung could be due to atypical pneumonia, pulmonary hemorrhage or left-sided aspiration. 5. Aortic atherosclerosis.  09/12/2022 I had a phone discussion with patient after his PET scan resulted.  Recommend systemic chemotherapy.  Patient would like to defer until his second opinion visit at [facility].   09/15/2022 Imaging   CT angio chest pulmonary embolism protocol showed 1. No evidence for pulmonary embolism. 2. Progression of bilateral multifocal patchy ground-glass and interstitial opacities, left greater than right. Findings are concerning for multifocal  pneumonia. 3. Air-fluid levels in the left lower lobe bronchi worrisome for aspiration. 4. Mediastinal and hilar lymphadenopathy has increased in size and number when compared to the prior study, likely reactive.5. Cardiomegaly. 6. Findings compatible with pulmonary artery hypertension.7. Stable right liver mass and complex right renal cystic lesion. 8. Stable enlargement of the left thyroid.   09/15/2022 - 09/17/2022 Hospital Admission   Patient was admitted due to shortness of breath, CT showed progression of bilateral multifocal patchy groundglass and interstitial opacities.  Left greater than right.  Concerning for multifocal pneumonia.  Patient was started on broad-spectrum antibiotics with significant improvement.  He was discharged on oral antibiotics.   09/19/2022 Miscellaneous   Patient went to [facility] for second opinion.  He was seen by Stanton Kidney, who felt that the presentation is consistent with metastatic cholangiocarcinoma, and agrees with the plan of cisplatin/gemcitabine/durvalumab..    10/11/2022 -  Chemotherapy   Patient is on Treatment Plan : Cisplatin D1,8 + Gemcitabine + Durvalumab  D1,8 q21d x     10/12/2022 Imaging   CT chest w contrast 1. Bilateral airspace and ground-glass opacities have mildly decreased in amount and density when compared to the prior study. Findings are compatible with resolving multifocal pneumonia. Continued follow-up imaging recommended. 2. Stable enlarged heterogeneous left thyroid gland with 3.2 cm nodule. This can be further evaluated with dedicated ultrasound. 3. Stable hepatic and right renal lesions, incompletely evaluated.   Aortic Atherosclerosis (ICD10-I70.0).   12/11/2022 Imaging   PET showed  1. There is been mild increase in size of the tracer avid mass within right lobe of liver compatible with known cholangiocarcinoma. The degree of tracer uptake however is slightly decreased in the interval. 2. Multifocal tracer avid bone  metastases are again noted. When compared with the previous exam there has been an mixed interval response to therapy. Specifically, there has been interval increase in size and FDG uptake  associated with the right ischial tuberosity lesion and there is a new lesion within the posterior left iliac bone. Decreased tracer uptake is associated with the left scapular lesion and there is stable tracer uptake associated with the right T1 transverse process lesion. 3. Persistent ground-glass and airspace densities within the left upper lobe and left lower lobe compatible with inflammatory or infectious process. Interval resolution of previous right lung ground-glass and airspace densities.      Interval history- Patient is 62 year old male currently undergoing cisplatin-gem-durvalumab chemotherapy for cholangiocarcinoma who [facility] for complaints of left arm and hand pain that started with his treatment 5 days ago. He describes pain with infusion, started with gemcitabine. Pain has gradually improved since treatment but persists. He has some redness of the hand and wrist. He's had ongoing swelling of the arm and hand which is chronic and unchanged. He has hx of DVT of LLE and has been on coumadin for > decade. Had port placed yesterday and has been taking lovenox.   Review of systems- Review of Systems  Constitutional:  Negative for chills, fever, malaise/fatigue and weight loss.  HENT:  Negative for hearing loss, nosebleeds, sore throat and tinnitus.   Eyes:  Negative for blurred vision and double vision.  Respiratory:  Negative for cough, hemoptysis, shortness of breath and wheezing.   Cardiovascular:  Positive for leg swelling. Negative for chest pain and palpitations.  Gastrointestinal:  Negative for abdominal pain, blood in stool, constipation, diarrhea, melena, nausea and vomiting.  Genitourinary:  Negative for dysuria and urgency.  Musculoskeletal:  Negative for back  pain, falls, joint pain and myalgias.  Skin:  Negative for itching and rash.  Neurological:  Negative for dizziness, tingling, sensory change, loss of consciousness, weakness and headaches.  Endo/Heme/Allergies:  Negative for environmental allergies. Bruises/bleeds easily.  Psychiatric/Behavioral:  Negative for depression. The patient is not nervous/anxious and does not have insomnia.     No Known Allergies  Past Medical History:  Diagnosis Date   Antiphospholipid antibody syndrome (HCC)    Arthritis    Cancer ([place])    Diabetes mellitus without complication (HCC)    DVT (deep venous thrombosis) (HCC)    Lt leg   Dyspnea    Elevated lipids    Erythrocytosis    Hyperlipidemia    Hypertension    Lower extremity edema    Multinodular thyroid    Post-thrombotic syndrome     Past Surgical History:  Procedure Laterality Date   COLONOSCOPY WITH PROPOFOL N/A 09/23/2018   Per Dr. Alice Reichert, polyp, repeat in 3 yrs    EYE MUSCLE SURGERY     JOINT REPLACEMENT Left 07/22/2017   PORTA CATH INSERTION N/A 12/24/2022   Procedure: PORTA CATH INSERTION;  Surgeon: Algernon Huxley, MD;  Location: [facility];  Service: Cardiovascular;  Laterality: N/A;   TOTAL HIP ARTHROPLASTY Left 07/22/2017   Procedure: TOTAL HIP ARTHROPLASTY;  Surgeon: Dereck Leep, MD;  Location: [facility];  Service: Orthopedics;  Laterality: Left;    Social History   Socioeconomic History   Marital status: Married    Spouse name: Not on file   Number of children: Not on file   Years of education: Not on file   Highest education level: Not on file  Occupational History   Not on file  Tobacco Use   Smoking status: Former    Packs/day: 0.50    Years: 6.00    Total pack years: 3.00  Types: Cigarettes    Quit date: 56    Years since quitting: 42.1   Smokeless tobacco: Never   Tobacco comments:    Pt states when he did smoke he smoked at most 1 ppd. ALS 09/26/2022  Vaping Use   Vaping Use: Never used   Substance and Sexual Activity   Alcohol use: Yes    Comment: occ   Drug use: No   Sexual activity: Not on file  Other Topics Concern   Not on file  Social History Narrative   Not on file   Social Determinants of Health   Financial Resource Strain: Low Risk  (08/27/2022)   Overall Financial Resource Strain (CARDIA)    Difficulty of Paying Living Expenses: Not very hard  Food Insecurity: No Food Insecurity (09/15/2022)   Hunger Vital Sign    Worried About Running Out of Food in the Last Year: Never true    Ran Out of Food in the Last Year: Never true  Transportation Needs: No Transportation Needs (09/15/2022)   PRAPARE - Hydrologist (Medical): No    Lack of Transportation (Non-Medical): No  Physical Activity: Insufficiently Active (08/27/2022)   Exercise Vital Sign    Days of Exercise per Week: 3 days    Minutes of Exercise per Session: 30 min  Stress: Stress Concern Present (08/27/2022)   [place]    Feeling of Stress : Rather much  Social Connections: Socially Integrated (08/27/2022)   Social Connection and Isolation Panel [NHANES]    Frequency of Communication with Friends and Family: More than three times a week    Frequency of Social Gatherings with Friends and Family: More than three times a week    Attends Religious Services: 1 to 4 times per year    Active Member of [employer] or Organizations: Yes    Attends Archivist Meetings: Never    Marital Status: Married  Human resources officer Violence: Not At Risk (09/15/2022)   Humiliation, Afraid, Rape, and Kick questionnaire    Fear of Current or Ex-Partner: No    Emotionally Abused: No    Physically Abused: No    Sexually Abused: No    Family History  Problem Relation Age of Onset   Breast cancer Mother    Emphysema Father    Colon cancer Maternal Grandmother      Current Outpatient Medications:    albuterol  (VENTOLIN HFA) 108 (90 Base) MCG/ACT inhaler, Inhale 2 puffs into the lungs every 4 (four) hours as needed., Disp: 18 g, Rfl: 1   amLODipine (NORVASC) 10 MG tablet, Take 1 tablet (10 mg total) by mouth daily., Disp: 30 tablet, Rfl: 0   Calcium Carb-Cholecalciferol (CALCIUM 500 + D PO), Take 1 tablet by mouth daily., Disp: , Rfl:    dexamethasone (DECADRON) 4 MG tablet, TAKE 2 TABLETS DAILY X 3 DAYS STARTING THE DAY AFTER CISPLATIN CHEMOTHERAPY. TAKE WITH FOOD., Disp: 30 tablet, Rfl: 1   enoxaparin (LOVENOX) 120 MG/0.8ML injection, Inject 0.8 mLs (120 mg total) into the skin every 12 (twelve) hours., Disp: 48 mL, Rfl: 2   glucose blood test strip, 1 each by Other route as needed for other. accu chek aviva plusUse as instructed, Disp: , Rfl:    lidocaine-prilocaine (EMLA) cream, Apply 1 Application topically as needed., Disp: 30 g, Rfl: 5   LORazepam (ATIVAN) 0.5 MG tablet, TAKE 1 TABLET (0.5 MG TOTAL) BY MOUTH EVERY 8 (EIGHT) HOURS AS  NEEDED FOR ANXIETY (NAUSEA VOMITING)., Disp: 60 tablet, Rfl: 0   magnesium chloride (SLOW-MAG) 64 MG TBEC SR tablet, Take 1 tablet (64 mg total) by mouth daily., Disp: 30 tablet, Rfl: 3   MUCINEX D MAX STRENGTH 504 644 1172 MG TB12, Take 1,200 mg by mouth 2 (two) times daily as needed (for congestion or to loosen mucous in the chest)., Disp: , Rfl:    ondansetron (ZOFRAN) 8 MG tablet, TAKE 1 TABLET (8 MG TOTAL) BY MOUTH EVERY 8 (EIGHT) HOURS AS NEEDED FOR NAUSEA OR VOMITING. START ON THE THIRD DAY AFTER CISPLATIN., Disp: 30 tablet, Rfl: 1   pravastatin (PRAVACHOL) 20 MG tablet, Take 1 tablet (20 mg total) by mouth daily., Disp: 90 tablet, Rfl: 3   prochlorperazine (COMPAZINE) 10 MG tablet, TAKE 1 TABLET (10 MG TOTAL) BY MOUTH EVERY 6 HOURS AS NEEDED FOR NAUSEA AND VOMITING, Disp: 30 tablet, Rfl: 1   tadalafil (CIALIS) 20 MG tablet, Take 0.5-1 tablets (10-20 mg total) by mouth every other day as needed for erectile dysfunction., Disp: 20 tablet, Rfl: 11   triamcinolone cream  (KENALOG) 0.1 %, Apply 1 Application topically 3 (three) times daily. (Patient taking differently: Apply 1 Application topically 3 (three) times daily as needed (for bilateral, below-the-knees skin irritation).), Disp: 453.6 g, Rfl: 1   Azelastine & Fluticasone 137 & 50 MCG/ACT THPK, Place 1 spray into the nose daily as needed. (Patient not taking: Reported on 12/12/2022), Disp: 1 each, Rfl: 1   benzonatate (TESSALON) 200 MG capsule, Take 1 capsule (200 mg total) by mouth 3 (three) times daily as needed for cough. (Patient not taking: Reported on 12/17/2022), Disp: 30 capsule, Rfl: 3   glipiZIDE (GLUCOTROL) 10 MG tablet, Take 1 tablet (10 mg total) by mouth 2 (two) times daily before a meal. (Patient taking differently: Take 10 mg by mouth daily before breakfast.), Disp: 180 tablet, Rfl: 3   metFORMIN (GLUCOPHAGE) 500 MG tablet, TAKE TWO TABLETS BY MOUTH EVERY MORNING WITH BREAKFAST (Patient not taking: Reported on 12/25/2022), Disp: 180 tablet, Rfl: 1  Physical exam:  Vitals:   12/25/22 1000  BP: 126/60  Pulse: 75  Resp: 18  Temp: 98.6 F (37 C)  TempSrc: Tympanic  Weight: 280 lb (127 kg)  Height: '6\' 3"'$  (1.905 m)   Physical Exam Vitals reviewed.  Constitutional:      Appearance: He is not ill-appearing.  Cardiovascular:     Rate and Rhythm: Normal rate and regular rhythm.  Pulmonary:     Effort: No respiratory distress.  Abdominal:     General: There is no distension.     Tenderness: There is no abdominal tenderness.  Musculoskeletal:        General: Swelling present.     Right lower leg: Edema present.     Left lower leg: Edema present.     Comments: Left hand mildly generalized erythema, some bruising of lateral aspect. Healing skin tear at middle finger PIP. Pitting edema of hand, wrist and arm. Similar bilaterally. No warmth. Mildly tender at mid hand.   Neurological:     Mental Status: He is alert and oriented to person, place, and time.  Psychiatric:        Mood and Affect:  Mood normal.        Behavior: Behavior normal.        Latest Ref Rng & Units 12/20/2022    8:45 AM  CMP  Glucose 70 - 99 mg/dL 191   BUN 8 - 23 mg/dL 8  Creatinine 0.61 - 1.24 mg/dL 0.76   Sodium 135 - 145 mmol/L 135   Potassium 3.5 - 5.1 mmol/L 3.5   Chloride 98 - 111 mmol/L 100   CO2 22 - 32 mmol/L 23   Calcium 8.9 - 10.3 mg/dL 9.4   Total Protein 6.5 - 8.1 g/dL 6.3   Total Bilirubin 0.3 - 1.2 mg/dL 0.5   Alkaline Phos 38 - 126 U/L 64   AST 15 - 41 U/L 32   ALT 0 - 44 U/L 35       Latest Ref Rng & Units 12/20/2022    8:45 AM  CBC  WBC 4.0 - 10.5 K/uL 3.1   Hemoglobin 13.0 - 17.0 g/dL 11.9   Hematocrit 39.0 - 52.0 % 34.9   Platelets 150 - 400 K/uL 269     PERIPHERAL VASCULAR CATHETERIZATION  Result Date: 12/24/2022 See surgical note for result.  NM PET Image Restag (PS) Skull Base To Thigh  Result Date: 12/11/2022 CLINICAL DATA:  Subsequent treatment strategy for cholangiocarcinoma. EXAM: NUCLEAR MEDICINE PET SKULL BASE TO THIGH TECHNIQUE: 13.7 mCi F-18 FDG was injected intravenously. Full-ring PET imaging was performed from the skull base to thigh after the radiotracer. CT data was obtained and used for attenuation correction and anatomic localization. Fasting blood glucose: 146 mg/dl COMPARISON:  PET-CT 09/12/2022 FINDINGS: Mediastinal blood pool activity: SUV max 1.67 Liver activity: SUV max NA NECK: No hypermetabolic lymph nodes in the neck. Incidental CT findings: Heterogeneous and enlarged left lobe of thyroid gland is again noted. This has been evaluated on previous imaging. (ref: J Am Coll Radiol. 2015 Feb;12(2): 143-50). CHEST: No hypermetabolic mediastinal or hilar nodes. No suspicious pulmonary nodules on the CT scan. Incidental CT findings: Persistent diffuse ground-glass and airspace disease within the left upper lobe and left lower lobe identified. Interval resolution of previous right lung ground-glass and airspace densities. ABDOMEN/PELVIS: Right lobe of liver  mass scratch set tracer avid lesion scratch set tracer avid mass within the right hepatic lobe is again seen. This is somewhat ill-defined measuring approximately 8.2 x 5.6 cm within SUV max of 5.88, image 149/2. On the prior PET-CT this measured approximately 6.6 x 4.2 cm with SUV max of 7.09. No abnormal tracer uptake identified within the pancreas, spleen or adrenal glands. No tracer avid abdominopelvic lymph nodes. Incidental CT findings: Prominent upper abdominal nodes are again noted as seen previously, including reference gastrohepatic ligament lymph node measuring 1.7 cm with SUV max of 1.87, image, image 155/2. Previously this measured 1.9 cm with SUV max of 1.58. Unchanged right kidney cyst. No follow-up imaging recommended. SKELETON: Multifocal tracer avid bone metastases are again noted. Index lesions include: -expansile lytic lesion involving the right T1 transverse process with SUV max of 6.52, image 59/2. Previously this had an SUV max of 6.82. -Lytic lesion associated with the base of the left coracoid process has an SUV max of 2.67, image 67/2. Previously 4.25. -Expansile lesion involving the right ischial tuberosity measures 2.4 cm within SUV max of 6.41, image 268/2. On the prior exam this measured 1.3 cm within SUV max of 4.08. -Tracer avid lesion involving the left ischial tuberosity has an SUV max of 3.28, image 265/2 Unchanged from previous exam. -New lesion within the posterior left iliac bone has an SUV max of 5.67, image 243/2. Incidental CT findings: None. IMPRESSION: 1. There is been mild increase in size of the tracer avid mass within right lobe of liver compatible with known cholangiocarcinoma. The degree of tracer  uptake however is slightly decreased in the interval. 2. Multifocal tracer avid bone metastases are again noted. When compared with the previous exam there has been an mixed interval response to therapy. Specifically, there has been interval increase in size and FDG uptake  associated with the right ischial tuberosity lesion and there is a new lesion within the posterior left iliac bone. Decreased tracer uptake is associated with the left scapular lesion and there is stable tracer uptake associated with the right T1 transverse process lesion. 3. Persistent ground-glass and airspace densities within the left upper lobe and left lower lobe compatible with inflammatory or infectious process. Interval resolution of previous right lung ground-glass and airspace densities. Electronically Signed   By: Kerby Moors M.D.   On: 12/11/2022 06:37    Assessment and plan- Patient is a 62 y.o. male   Left arm pain- suspect related to treatment. Unclear if infiltration or extravasation occurred. Symptoms are improving however and no evidence of tissue injury or obvious necrosis. He's outside of window for antidote. Advised that more likely he will experience inflammation and discomfort. He can trial warm compresses to see if that helps improve his comfort- 20-30 min 4 times a day. He has now had port placed which will likely resolve underlying issue and help improve tolerability of infusion. Advised close monitoring. If arm becomes hot, more reddened, or painful, would recommend re-evaluation possible antimicrobial coverage +/- ultrasound though since he is on lovenox and coumadin, VTE less likely.   Follow up as planned or if symptoms improve or worsen in interim.    Visit Diagnosis 1. Arm pain, left     Patient expressed understanding and was in agreement with this plan. He also understands that He can call clinic at any time with any questions, concerns, or complaints.   Thank you for allowing me to participate in the care of this very pleasant patient.   Beckey Rutter, DNP, AGNP-C, [place] [facility] [place]  CC: Dr Tasia Catchings

## 2022-12-26 ENCOUNTER — Encounter: Payer: Self-pay | Admitting: Oncology

## 2022-12-26 NOTE — Telephone Encounter (Signed)
Per Glenda Chroman, [facility]: the tissue [phone]) recieved for the solid tumor order was not successful as there was insufficient material to proceed with testing. They have requested alternate block on 2-19 and are still waiting for alterante tissue, the order has been placed on hold and they will continue to follow up on alterante tissue request.

## 2022-12-26 NOTE — Telephone Encounter (Signed)
Denial letter scanned in media.

## 2022-12-27 ENCOUNTER — Encounter: Payer: Self-pay | Admitting: Vascular Surgery

## 2022-12-27 DIAGNOSIS — C221 Intrahepatic bile duct carcinoma: Secondary | ICD-10-CM | POA: Diagnosis not present

## 2022-12-28 ENCOUNTER — Encounter: Payer: Self-pay | Admitting: Oncology

## 2022-12-31 NOTE — Telephone Encounter (Signed)
xF report sent to scan

## 2023-01-01 ENCOUNTER — Encounter: Payer: Self-pay | Admitting: Oncology

## 2023-01-01 NOTE — Telephone Encounter (Signed)
Prior review/ certification form has been faxed to [facility] [place]

## 2023-01-02 ENCOUNTER — Inpatient Hospital Stay (HOSPITAL_BASED_OUTPATIENT_CLINIC_OR_DEPARTMENT_OTHER): Payer: BC Managed Care – PPO | Admitting: Oncology

## 2023-01-02 ENCOUNTER — Encounter: Payer: Self-pay | Admitting: Oncology

## 2023-01-02 ENCOUNTER — Other Ambulatory Visit: Payer: Self-pay | Admitting: Family Medicine

## 2023-01-02 ENCOUNTER — Inpatient Hospital Stay: Payer: BC Managed Care – PPO | Attending: Oncology

## 2023-01-02 VITALS — BP 141/67 | HR 78 | Temp 98.4°F | Resp 18 | Wt 294.0 lb

## 2023-01-02 DIAGNOSIS — Z86718 Personal history of other venous thrombosis and embolism: Secondary | ICD-10-CM | POA: Insufficient documentation

## 2023-01-02 DIAGNOSIS — R053 Chronic cough: Secondary | ICD-10-CM

## 2023-01-02 DIAGNOSIS — Z452 Encounter for adjustment and management of vascular access device: Secondary | ICD-10-CM | POA: Insufficient documentation

## 2023-01-02 DIAGNOSIS — Z5111 Encounter for antineoplastic chemotherapy: Secondary | ICD-10-CM | POA: Insufficient documentation

## 2023-01-02 DIAGNOSIS — Z5112 Encounter for antineoplastic immunotherapy: Secondary | ICD-10-CM | POA: Diagnosis not present

## 2023-01-02 DIAGNOSIS — C221 Intrahepatic bile duct carcinoma: Secondary | ICD-10-CM

## 2023-01-02 DIAGNOSIS — D6861 Antiphospholipid syndrome: Secondary | ICD-10-CM | POA: Diagnosis not present

## 2023-01-02 DIAGNOSIS — Z7901 Long term (current) use of anticoagulants: Secondary | ICD-10-CM | POA: Insufficient documentation

## 2023-01-02 DIAGNOSIS — D6481 Anemia due to antineoplastic chemotherapy: Secondary | ICD-10-CM | POA: Insufficient documentation

## 2023-01-02 DIAGNOSIS — R918 Other nonspecific abnormal finding of lung field: Secondary | ICD-10-CM

## 2023-01-02 DIAGNOSIS — C7951 Secondary malignant neoplasm of bone: Secondary | ICD-10-CM | POA: Diagnosis not present

## 2023-01-02 DIAGNOSIS — T80818A Extravasation of other vesicant agent, initial encounter: Secondary | ICD-10-CM | POA: Insufficient documentation

## 2023-01-02 DIAGNOSIS — Z95828 Presence of other vascular implants and grafts: Secondary | ICD-10-CM

## 2023-01-02 DIAGNOSIS — T80818D Extravasation of other vesicant agent, subsequent encounter: Secondary | ICD-10-CM

## 2023-01-02 LAB — CBC WITH DIFFERENTIAL/PLATELET
Abs Immature Granulocytes: 0.01 10*3/uL (ref 0.00–0.07)
Basophils Absolute: 0 10*3/uL (ref 0.0–0.1)
Basophils Relative: 1 %
Eosinophils Absolute: 0 10*3/uL (ref 0.0–0.5)
Eosinophils Relative: 0 %
HCT: 32.4 % — ABNORMAL LOW (ref 39.0–52.0)
Hemoglobin: 10.4 g/dL — ABNORMAL LOW (ref 13.0–17.0)
Immature Granulocytes: 0 %
Lymphocytes Relative: 9 %
Lymphs Abs: 0.4 10*3/uL — ABNORMAL LOW (ref 0.7–4.0)
MCH: 31.1 pg (ref 26.0–34.0)
MCHC: 32.1 g/dL (ref 30.0–36.0)
MCV: 97 fL (ref 80.0–100.0)
Monocytes Absolute: 0.7 10*3/uL (ref 0.1–1.0)
Monocytes Relative: 16 %
Neutro Abs: 3 10*3/uL (ref 1.7–7.7)
Neutrophils Relative %: 74 %
Platelets: 149 10*3/uL — ABNORMAL LOW (ref 150–400)
RBC: 3.34 MIL/uL — ABNORMAL LOW (ref 4.22–5.81)
RDW: 18.1 % — ABNORMAL HIGH (ref 11.5–15.5)
WBC: 4 10*3/uL (ref 4.0–10.5)
nRBC: 0.5 % — ABNORMAL HIGH (ref 0.0–0.2)

## 2023-01-02 LAB — COMPREHENSIVE METABOLIC PANEL
ALT: 20 U/L (ref 0–44)
AST: 24 U/L (ref 15–41)
Albumin: 3.5 g/dL (ref 3.5–5.0)
Alkaline Phosphatase: 55 U/L (ref 38–126)
Anion gap: 10 (ref 5–15)
BUN: 6 mg/dL — ABNORMAL LOW (ref 8–23)
CO2: 25 mmol/L (ref 22–32)
Calcium: 9 mg/dL (ref 8.9–10.3)
Chloride: 103 mmol/L (ref 98–111)
Creatinine, Ser: 0.71 mg/dL (ref 0.61–1.24)
GFR, Estimated: >60 mL/min (ref >60)
Glucose, Bld: 124 mg/dL — ABNORMAL HIGH (ref 70–99)
Potassium: 3.6 mmol/L (ref 3.5–5.1)
Sodium: 138 mmol/L (ref 135–145)
Total Bilirubin: 0.5 mg/dL (ref 0.3–1.2)
Total Protein: 6.2 g/dL — ABNORMAL LOW (ref 6.5–8.1)

## 2023-01-02 LAB — MAGNESIUM: Magnesium: 1.5 mg/dL — ABNORMAL LOW (ref 1.7–2.4)

## 2023-01-02 MED ORDER — HEPARIN SOD (PORK) LOCK FLUSH 100 UNIT/ML IV SOLN
500.0000 [IU] | Freq: Once | INTRAVENOUS | Status: AC
  Administered 2023-01-02: 500 [IU] via INTRAVENOUS
  Filled 2023-01-02: qty 5

## 2023-01-02 MED ORDER — ONDANSETRON HCL 8 MG PO TABS: 8.0000 mg | ORAL_TABLET | Freq: Three times a day (TID) | ORAL | 1 refills | Status: DC | PRN

## 2023-01-02 MED ORDER — PROCHLORPERAZINE MALEATE 10 MG PO TABS: ORAL_TABLET | ORAL | 1 refills | Status: DC

## 2023-01-02 MED ORDER — SODIUM CHLORIDE 0.9% FLUSH
10.0000 mL | Freq: Once | INTRAVENOUS | Status: AC
  Administered 2023-01-02: 10 mL via INTRAVENOUS
  Filled 2023-01-02: qty 10

## 2023-01-02 MED FILL — Dexamethasone Sodium Phosphate Inj 100 MG/10ML: INTRAMUSCULAR | Qty: 1 | Status: AC

## 2023-01-02 MED FILL — Fosaprepitant Dimeglumine For IV Infusion 150 MG (Base Eq): INTRAVENOUS | Qty: 5 | Status: AC

## 2023-01-02 NOTE — Assessment & Plan Note (Signed)
Increase slow Mag to twice daily

## 2023-01-02 NOTE — Assessment & Plan Note (Addendum)
Follow up with radiation oncology for palliative radiation to bone lesions.  Consider bisphosphonate after dental clearance is obtained.

## 2023-01-02 NOTE — Progress Notes (Signed)
Pt here for follow up. Pt complains of swelling to left hand with soreness. No improvement noted per pt. Pt requesting refill on zofran and compazine.

## 2023-01-02 NOTE — Assessment & Plan Note (Signed)
He still has some soft tissue swelling secondary to extravasation incident from irritants.  Close monitor

## 2023-01-02 NOTE — Telephone Encounter (Signed)
medication denied once again. Per pt's mychart message, "I went by [facility] and the medication is only $6.81 without insurance. It's handled"

## 2023-01-02 NOTE — Assessment & Plan Note (Addendum)
Imaging findings and pathology results were reviewed with patient.  Clinical diagnosis is cholangiocarcinoma, although atypical with both normal CEA and CA 19.9 Interim PET scan after 3 cycles of treatment showed mixed response- decreased hypermetabolism but increased size.   He has had new bone lesions. Refer to radiation oncology for feasibility of treating new bone lesions.  For now I will continue current regimen with short-term repeat imaging. Labs are reviewed and discussed with patient. S/p 4 cycles  Gemcitabine, Cisplatin and Durvalumab.  Overall he tolerates very well.  Proceed with cycle 5 D1 Cisplatin Gemcitabine Durvalumab, he will return in 1 week of D8 Gemcitabine and Cisplatin treatment.  Trend cancer markers. Short term imaging re-evaluation.  [facility] liquid biopsy FGFR2 -ADK mutation, TMB 0,  2nd line options include Futibatinib, or Pemigatinib or Infigratinib

## 2023-01-02 NOTE — Assessment & Plan Note (Signed)
He follows up with pulmonology.  repeat CT images were reviewed and discussed with patient. I also discussed with pulmonology Dr. Silas Flood who recommends surgical biopsy. DDX includes organizing pneumonia, ILD, aspiration, lymphatic spread, etc. He declined repeat bronchoscopy/biopsy.

## 2023-01-02 NOTE — Assessment & Plan Note (Signed)
#  Recurrent lower extremity DVT, secondary to antiphospholipid syndrome.-.  Persistent lupus anticoagulant positive.Recommend life time anticoagulation Patient was previously on Coumadin now on Lovenox. continue Lovenox 1 mg/kg twice daily

## 2023-01-02 NOTE — Assessment & Plan Note (Signed)
-   Continue calcium supplementation. 

## 2023-01-02 NOTE — Progress Notes (Signed)
Hematology/Oncology Progress note [phone] [phone]    CHIEF COMPLAINTS/REASON FOR VISIT:  Follow up for right lower extremity DVT, antiphospholipid syndrome, metastatic cholangiocarcinoma.  ASSESSMENT & PLAN:   Cancer Staging  Cholangiocarcinoma [facility]) Staging form: Intrahepatic Bile Duct, AJCC 8th Edition - Clinical stage from 08/27/2022: Stage IV (cT2, cN1, cM1) - Signed by Earlie Server, MD on 09/13/2022   Cholangiocarcinoma [facility]) Imaging findings and pathology results were reviewed with patient.  Clinical diagnosis is cholangiocarcinoma, although atypical with both normal CEA and CA 19.9 Interim PET scan after 3 cycles of treatment showed mixed response- decreased hypermetabolism but increased size.   He has had new bone lesions. Refer to radiation oncology for feasibility of treating new bone lesions.  For now I will continue current regimen with short-term repeat imaging. Labs are reviewed and discussed with patient. S/p 4 cycles  Gemcitabine, Cisplatin and Durvalumab.  Overall he tolerates very well.  Proceed with cycle 5 D1 Cisplatin Gemcitabine Durvalumab, he will return in 1 week of D8 Gemcitabine and Cisplatin treatment.  Trend cancer markers. Short term imaging re-evaluation.  NGS-Tempus liquid biopsy FGFR2 -ADK mutation, TMB 0,  2nd line options include Futibatinib, or Pemigatinib or Infigratinib   Antiphospholipid syndrome ([place]) #Recurrent lower extremity DVT, secondary to antiphospholipid syndrome.-.  Persistent lupus anticoagulant positive.Recommend life time anticoagulation Patient was previously on Coumadin now on Lovenox. continue Lovenox 1 mg/kg twice daily   Hypocalcemia Continue calcium supplementation.   Lung infiltrate on CT He follows up with pulmonology.  repeat CT images were reviewed and discussed with patient. I also discussed with pulmonology Dr. Silas Flood who recommends surgical biopsy. DDX includes organizing pneumonia, ILD,  aspiration, lymphatic spread, etc. He declined repeat bronchoscopy/biopsy.     Metastasis to bone [facility]) Follow up with radiation oncology for palliative radiation to bone lesions.  Consider bisphosphonate after dental clearance is obtained.  Extravasation accident He still has some soft tissue swelling secondary to extravasation incident from irritants.  Close monitor   No orders of the defined types were placed in this encounter.   Follow up Per LOS  All questions were answered. The patient knows to call the clinic with any problems, questions or concerns.  Earlie Server, MD, PhD [facility] Hematology Oncology 01/02/2023    HISTORY OF PRESENTING ILLNESS:  Patient reports remote history of left lower extremity DVT in 2002.  He was initiated on Lovenox and bridged to Coumadin.  Patient took warfarin for 2 years before anticoagulation was stopped. 12/03/2021, patient presented to emergency room for evaluation of right lower extremity pain and swelling for about a week.  Started on the inner side of right thigh and migrated to the right calf. + Associated with swelling.  Denies any recent injury, hospitalization, surgery.  He first noticed the symptoms after playing basketball with his grandson.   12/03/2021, right lower extremity ultrasound showed occlusive DVT extending from the mid aspect of the femoral vein through the imaged tibial vein.  Age-indeterminate. Patient was started on Xarelto. He was referred to establish care with vascular surgeon and was seen by [name] on 12/06/2021.  Shared decision was made not to proceed with embolectomy.  Continue anticoagulation. Patient was referred to hematology oncology for further evaluation.  Patient denies any family history of blood clots.  Denies any unintentional weight loss, fever or night sweats. 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  He  sometimes stay in his  car while waiting for next assessment appointment.  He reports the right lower extremity symptom has improved since the start of Xarelto.  No active bleeding events.  #12/18/2021 hypercoagulable work-up showed JAK2 V617F mutation negative, with reflex to other mutations CALR, MPL, JAK 2 Ex 12-15 mutations negative, negative anticardiolipin IgG and IgM antibodies, positive lupus anticoagulant, negative factor V Leiden mutation, negative prothrombin gene mutation, normal protein C activity, normal protein S antigen level. Patient was recommended to switch to Coumadin with INR goal of 2-3.  #03/15/2022, repeat lupus anticoagulant is persistently positive- + antiphospholipid syndrome.  Patient is currently on Lovenox 1 mg/kg twice daily.  admitted due to multifocal pneumonia, treated with Influenza panel negative, COVID-negative, HIV negative, Complete course of antibiotics with Zithromax, Vantin   + HCV antibody positive, HCV RNA quantification not detected. HCV RT PCR not detected.   INTERVAL HISTORY Christopher Marsh is a 62 y.o. male who has above history reviewed by me today presents for follow up visit for management of right lower extremity DVT and antiphospholipid syndrome, metastatic cholangiocarcinoma.  Oncology History  Cholangiocarcinoma ([place])  10/30/2021 Imaging   CT chest wo contrast 1. Heterogeneous bilateral ground-glass disease, multilobar but worst in the right middle lobe, lingula and lower lobes. There is progression of the abnormality since 10/12/2022. Though pneumonia could produce this appearance, given persistence and fluctuating appearance since October, consider alternative entities such as hypersensitivity pneumonitis or idiopathic interstitial pneumonia. 2. Slightly enlarged pulmonary trunk, possible arterial hypertension 3. Redemonstrated large hypodense right hepatic lobe mass.   08/18/2022 Imaging   CT chest w contrast showed 1. Multifocal  airspace disease in both lungs, left side greater than right. Findings are most compatible with multifocal pneumonia. 2. Multiple new hepatic lesions with enlarging lymph nodes in the upper abdomen and chest. Findings are concerning for metastatic disease. Based on the multifocal pneumonia, hepatic abscesses would also be in the differential diagnosis. Recommend further characterization of the abdomen and pelvis with CT with IV contrast. 3. Enlargement of the main pulmonary artery could be associated with pulmonary hypertension. 4. Coronary artery calcifications. 5. Multinodular goiter. Patient has known thyroid nodules and previous thyroid ultrasound.    08/18/2022 Imaging   CT abdomen pelvis w contrast  Interval development of 6.5 x 5.2 cm heterogeneously enhancing mass in right hepatic lobe. This is highly concerning for neoplasm or malignancy, and further evaluation with MRI is recommended.   Mildly enlarged periaortic adenopathy is noted, with the largest lymph node measuring 12 mm. Metastatic disease cannot be excluded. Mildly enlarged adenopathy is also noted in the gastrohepatic ligament, but this is unchanged compared to prior exam.   Stable bibasilar lung opacities are noted concerning for inflammation, atelectasis or possibly scarring.   Grossly stable multi-septated cystic lesion is seen involving upper pole of right kidney with peripheral calcifications compared to prior exam of 2019, most likely representing benign etiology.   Aortic Atherosclerosis    08/18/2022 - 08/21/2022 Hospital Admission   Admitted due to multifocal pneumonia, treated with Influenza panel negative, COVID-negative, HIV negative, Complete course of antibiotics with Zithromax, Vantin     08/19/2022 Imaging   MR abdomen w wo contrast  Marked caudate lobe hypertrophy, highly suspicious for hepatic cirrhosis.   Numerous hypervascular masses throughout the right hepatic lobe, highly suspicious for multifocal  hepatocellular carcinoma. Recommend correlation with AFP and consider tissue sampling.   Mild abdominal lymphadenopathy, with differential diagnosis including metastatic disease and reactive lymphadenopathy in setting of cirrhosis.   Bilateral lower  lobe infiltrates, as better demonstrated on recent CT.    08/19/2022 Tumor Marker   AFP 4.1   08/27/2022 Initial Diagnosis   Cholangiocarcinoma  -08/20/22 Liver mass biopsy showed poorly differentiated carcinoma Immunohistochemical stains show that the poorly differentiated carcinoma is positive for CK7 and MOC-31, suggestive of a poorly differentiated adenocarcinoma.  Immunohistochemical stains for CK20, CDX2, HepPar 1 and arginase are negative.  Immunostain for glypican-3 shows very focal  likely nonspecific labeling.  This immunoprofile is nonspecific but can be compatible with a poorly differentiated primary cholangiocarcinoma in absence of any other lesions.   NGS showed BAP 1 mutation.  Case was presented at tumor board, I have also discussed with pathologist Dr.Rubinas,  IHC pattern is more consistent with adenocarcinoma, unlikely [place] [due to negative HepPar 1 and arginase], cholangiocarcinoma is favored, although this is a diagnosis of exclusion. Second opinion ar [facility]   08/27/2022 Cancer Staging   Staging form: Intrahepatic Bile Duct, AJCC 8th Edition - Clinical stage from 08/27/2022: Stage IV (cT2, cN1, cM1) - Signed by Earlie Server, MD on 09/13/2022 Stage prefix: Initial diagnosis   08/27/2022 Tumor Marker   CA 19.9  16 CEA 2.8   09/12/2022 Imaging   PET scan showed 1. Large right hepatic lobe mass is hypermetabolic and consistent with known cholangiocarcinoma. 2. Scattered borderline enlarged upper abdominal lymph nodes do not show any significant FDG uptake. 3. 4 hypermetabolic bone lesions consistent with metastatic disease. 4. Progressive diffuse airspace process in the left lung could be due to atypical pneumonia, pulmonary  hemorrhage or left-sided aspiration. 5. Aortic atherosclerosis.  09/12/2022 I had a phone discussion with patient after his PET scan resulted.  Recommend systemic chemotherapy.  Patient would like to defer until his second opinion visit at [facility].   09/15/2022 Imaging   CT angio chest pulmonary embolism protocol showed 1. No evidence for pulmonary embolism. 2. Progression of bilateral multifocal patchy ground-glass and interstitial opacities, left greater than right. Findings are concerning for multifocal pneumonia. 3. Air-fluid levels in the left lower lobe bronchi worrisome for aspiration. 4. Mediastinal and hilar lymphadenopathy has increased in size and number when compared to the prior study, likely reactive.5. Cardiomegaly. 6. Findings compatible with pulmonary artery hypertension.7. Stable right liver mass and complex right renal cystic lesion. 8. Stable enlargement of the left thyroid.   09/15/2022 - 09/17/2022 Hospital Admission   Patient was admitted due to shortness of breath, CT showed progression of bilateral multifocal patchy groundglass and interstitial opacities.  Left greater than right.  Concerning for multifocal pneumonia.  Patient was started on broad-spectrum antibiotics with significant improvement.  He was discharged on oral antibiotics.   09/19/2022 Miscellaneous   Patient went to [facility] for [facility] opinion.  He was seen by Stanton Kidney, who felt that the presentation is consistent with metastatic cholangiocarcinoma, and agrees with the plan of cisplatin/gemcitabine/durvalumab..    10/11/2022 -  Chemotherapy   Patient is on Treatment Plan : Cisplatin D1,8 + Gemcitabine + Durvalumab  D1,8 q21d x     10/12/2022 Imaging   CT chest w contrast 1. Bilateral airspace and ground-glass opacities have mildly decreased in amount and density when compared to the prior study. Findings are compatible with resolving multifocal pneumonia. Continued follow-up imaging recommended. 2.  Stable enlarged heterogeneous left thyroid gland with 3.2 cm nodule. This can be further evaluated with dedicated ultrasound. 3. Stable hepatic and right renal lesions, incompletely evaluated.   Aortic Atherosclerosis (ICD10-I70.0).   12/11/2022 Imaging   PET showed  1.  There is been mild increase in size of the tracer avid mass within right lobe of liver compatible with known cholangiocarcinoma. The degree of tracer uptake however is slightly decreased in the interval. 2. Multifocal tracer avid bone metastases are again noted. When compared with the previous exam there has been an mixed interval response to therapy. Specifically, there has been interval increase in size and FDG uptake associated with the right ischial tuberosity lesion and there is a new lesion within the posterior left iliac bone. Decreased tracer uptake is associated with the left scapular lesion and there is stable tracer uptake associated with the right T1 transverse process lesion. 3. Persistent ground-glass and airspace densities within the left upper lobe and left lower lobe compatible with inflammatory or infectious process. Interval resolution of previous right lung ground-glass and airspace densities.      He tolerated chemotherapy. + fatigue after treatment. Left hand swelling due to extravasation incident 2 weeks ago.  No nausea vomiting diarrhea + No fever, chills.  He denies any pain Appetite is not good. Weight is stable.    Review of Systems  Constitutional:  Positive for fatigue. Negative for appetite change, chills and fever.  HENT:   Negative for hearing loss and voice change.   Eyes:  Negative for eye problems.  Respiratory:  Positive for shortness of breath. Negative for chest tightness and cough.   Cardiovascular:  Negative for chest pain.  Gastrointestinal:  Negative for abdominal distention, abdominal pain and blood in stool.  Endocrine: Negative for hot flashes.  Genitourinary:  Negative for  difficulty urinating and frequency.   Musculoskeletal:  Negative for arthralgias.  Skin:  Negative for itching and rash.  Neurological:  Negative for extremity weakness.  Hematological:  Negative for adenopathy. Bruises/bleeds easily.  Psychiatric/Behavioral:  Negative for confusion.     MEDICAL HISTORY:  Past Medical History:  Diagnosis Date   Antiphospholipid antibody syndrome ([place])    Arthritis    Cancer ([place])    Diabetes mellitus without complication (HCC)    DVT (deep venous thrombosis) (H[facility])    Lt leg   Dyspnea    Elevated lipids    Erythrocytosis    Hyperlipidemia    Hypertension    Lower extremity edema    Multinodular thyroid    Post-thrombotic syndrome     SURGICAL HISTORY: Past Surgical History:  Procedure Laterality Date   COLONOSCOPY WITH PROPOFOL N/A 09/23/2018   Per Dr. Alice Reichert, polyp, repeat in 3 yrs    EYE MUSCLE SURGERY     JOINT REPLACEMENT Left 07/22/2017   PORTA CATH INSERTION N/A 12/24/2022   Procedure: PORTA CATH INSERTION;  Surgeon: Algernon Huxley, MD;  Location: [facility];  Service: Cardiovascular;  Laterality: N/A;   TOTAL HIP ARTHROPLASTY Left 07/22/2017   Procedure: TOTAL HIP ARTHROPLASTY;  Surgeon: Dereck Leep, MD;  Location: [facility];  Service: Orthopedics;  Laterality: Left;    SOCIAL HISTORY: Social History   Socioeconomic History   Marital status: Married    Spouse name: Not on file   Number of children: Not on file   Years of education: Not on file   Highest education level: Not on file  Occupational History   Not on file  Tobacco Use   Smoking status: Former    Packs/day: 0.50    Years: 6.00    Total pack years: 3.00    Types: Cigarettes    Quit date: 71    Years since quitting: 14.2  Smokeless tobacco: Never   Tobacco comments:    Pt states when he did smoke he smoked at most 1 ppd. ALS 09/26/2022  Vaping Use   Vaping Use: Never used  Substance and Sexual Activity   Alcohol use: Yes    Comment: occ    Drug use: No   Sexual activity: Not on file  Other Topics Concern   Not on file  Social History Narrative   Not on file   Social Determinants of Health   Financial Resource Strain: Low Risk  (08/27/2022)   Overall Financial Resource Strain (CARDIA)    Difficulty of Paying Living Expenses: Not very hard  Food Insecurity: No Food Insecurity (09/15/2022)   Hunger Vital Sign    Worried About Running Out of Food in the Last Year: Never true    Ran Out of Food in the Last Year: Never true  Transportation Needs: No Transportation Needs (09/15/2022)   PRAPARE - Hydrologist (Medical): No    Lack of Transportation (Non-Medical): No  Physical Activity: Insufficiently Active (08/27/2022)   Exercise Vital Sign    Days of Exercise per Week: 3 days    Minutes of Exercise per Session: 30 min  Stress: Stress Concern Present (08/27/2022)   [place]    Feeling of Stress : Rather much  Social Connections: Socially Integrated (08/27/2022)   Social Connection and Isolation Panel [NHANES]    Frequency of Communication with Friends and Family: More than three times a week    Frequency of Social Gatherings with Friends and Family: More than three times a week    Attends Religious Services: 1 to 4 times per year    Active Member of [employer] or Organizations: Yes    Attends Archivist Meetings: Never    Marital Status: Married  Human resources officer Violence: Not At Risk (09/15/2022)   Humiliation, Afraid, Rape, and Kick questionnaire    Fear of Current or Ex-Partner: No    Emotionally Abused: No    Physically Abused: No    Sexually Abused: No    FAMILY HISTORY: Family History  Problem Relation Age of Onset   Breast cancer Mother    Emphysema Father    Colon cancer Maternal Grandmother     ALLERGIES:  has No Known Allergies.  MEDICATIONS:  Current Outpatient Medications  Medication Sig  Dispense Refill   albuterol (VENTOLIN HFA) 108 (90 Base) MCG/ACT inhaler Inhale 2 puffs into the lungs every 4 (four) hours as needed. 18 g 1   amLODipine (NORVASC) 10 MG tablet Take 1 tablet (10 mg total) by mouth daily. 30 tablet 0   Calcium Carb-Cholecalciferol (CALCIUM 500 + D PO) Take 1 tablet by mouth daily.     dexamethasone (DECADRON) 4 MG tablet TAKE 2 TABLETS DAILY X 3 DAYS STARTING THE DAY AFTER CISPLATIN CHEMOTHERAPY. TAKE WITH FOOD. 30 tablet 1   enoxaparin (LOVENOX) 120 MG/0.8ML injection Inject 0.8 mLs (120 mg total) into the skin every 12 (twelve) hours. 48 mL 2   glipiZIDE (GLUCOTROL) 10 MG tablet Take 1 tablet (10 mg total) by mouth 2 (two) times daily before a meal. (Patient taking differently: Take 10 mg by mouth daily before breakfast.) 180 tablet 3   glucose blood test strip 1 each by Other route as needed for other. accu chek aviva plusUse as instructed     lidocaine-prilocaine (EMLA) cream Apply 1 Application topically as needed. 30 g 5  LORazepam (ATIVAN) 0.5 MG tablet TAKE 1 TABLET (0.5 MG TOTAL) BY MOUTH EVERY 8 (EIGHT) HOURS AS NEEDED FOR ANXIETY (NAUSEA VOMITING). 60 tablet 0   magnesium chloride (SLOW-MAG) 64 MG TBEC SR tablet Take 1 tablet (64 mg total) by mouth daily. 30 tablet 3   metFORMIN (GLUCOPHAGE) 500 MG tablet TAKE TWO TABLETS BY MOUTH EVERY MORNING WITH BREAKFAST 180 tablet 1   MUCINEX D MAX STRENGTH [phone] MG TB12 Take 1,200 mg by mouth 2 (two) times daily as needed (for congestion or to loosen mucous in the chest).     pravastatin (PRAVACHOL) 20 MG tablet Take 1 tablet (20 mg total) by mouth daily. 90 tablet 3   tadalafil (CIALIS) 20 MG tablet Take 0.5-1 tablets (10-20 mg total) by mouth every other day as needed for erectile dysfunction. 20 tablet 11   triamcinolone cream (KENALOG) 0.1 % Apply 1 Application topically 3 (three) times daily. (Patient taking differently: Apply 1 Application topically 3 (three) times daily as needed (for bilateral,  below-the-knees skin irritation).) 453.6 g 1   benzonatate (TESSALON) 200 MG capsule Take 1 capsule (200 mg total) by mouth 3 (three) times daily as needed for cough. (Patient not taking: Reported on 12/17/2022) 30 capsule 3   ondansetron (ZOFRAN) 8 MG tablet Take 1 tablet (8 mg total) by mouth every 8 (eight) hours as needed for nausea or vomiting. Start on the third day after cisplatin. 90 tablet 1   prochlorperazine (COMPAZINE) 10 MG tablet TAKE 1 TABLET (10 MG TOTAL) BY MOUTH EVERY 6 HOURS AS NEEDED FOR NAUSEA AND VOMITING 90 tablet 1   No current facility-administered medications for this visit.     PHYSICAL EXAMINATION: ECOG PERFORMANCE STATUS: 1 - Symptomatic but completely ambulatory Vitals:   01/02/23 0924  BP: (!) 141/67  Pulse: 78  Resp: 18  Temp: 98.4 F (36.9 C)   Filed Weights   01/02/23 0924  Weight: 294 lb (133.4 kg)    Physical Exam Constitutional:      General: He is not in acute distress. HENT:     Head: Normocephalic and atraumatic.  Eyes:     General: No scleral icterus. Cardiovascular:     Rate and Rhythm: Normal rate.  Pulmonary:     Effort: Pulmonary effort is normal. No respiratory distress.     Breath sounds: No wheezing.  Abdominal:     General: Bowel sounds are normal. There is no distension.  Musculoskeletal:        General: Normal range of motion.     Cervical back: Normal range of motion and neck supple.     Right lower leg: No edema.     Comments: Bilateral chronic lower extremity swelling, varicose veins  Skin:    General: Skin is warm and dry.     Findings: No erythema or rash.  Neurological:     Mental Status: He is alert and oriented to person, place, and time. Mental status is at baseline.     Cranial Nerves: No cranial nerve deficit.  Psychiatric:        Mood and Affect: Mood normal.     LABORATORY DATA:  I have reviewed the data as listed    Latest Ref Rng & Units 01/02/2023    9:04 AM 12/20/2022    8:45 AM 12/12/2022     8:42 AM  CBC  WBC 4.0 - 10.5 K/uL 4.0  3.1  3.6   Hemoglobin 13.0 - 17.0 g/dL 10.4  11.9  11.6  Hematocrit 39.0 - 52.0 % 32.4  34.9  34.6   Platelets 150 - 400 K/uL 149  269  133       Latest Ref Rng & Units 01/02/2023    9:04 AM 12/20/2022    8:45 AM 12/12/2022    8:42 AM  CMP  Glucose 70 - 99 mg/dL 124  191  124   BUN 8 - 23 mg/dL '6  8  7   '$ Creatinine 0.61 - 1.24 mg/dL 0.71  0.76  0.78   Sodium 135 - 145 mmol/L 138  135  141   Potassium 3.5 - 5.1 mmol/L 3.6  3.5  4.1   Chloride 98 - 111 mmol/L 103  100  106   CO2 22 - 32 mmol/L '25  23  25   '$ Calcium 8.9 - 10.3 mg/dL 9.0  9.4  8.6   Total Protein 6.5 - 8.1 g/dL 6.2  6.3  5.8   Total Bilirubin 0.3 - 1.2 mg/dL 0.5  0.5  0.5   Alkaline Phos 38 - 126 U/L 55  64  57   AST 15 - 41 U/L 24  32  21   ALT 0 - 44 U/L 20  35  22     RADIOGRAPHIC STUDIES: I have personally reviewed the radiological images as listed and agreed with the findings in the report. PERIPHERAL VASCULAR CATHETERIZATION  Result Date: 12/24/2022 See surgical note for result.  NM PET Image Restag (PS) Skull Base To Thigh  Result Date: 12/11/2022 CLINICAL DATA:  Subsequent treatment strategy for cholangiocarcinoma. EXAM: NUCLEAR MEDICINE PET SKULL BASE TO THIGH TECHNIQUE: 13.7 mCi F-18 FDG was injected intravenously. Full-ring PET imaging was performed from the skull base to thigh after the radiotracer. CT data was obtained and used for attenuation correction and anatomic localization. Fasting blood glucose: 146 mg/dl COMPARISON:  PET-CT 09/12/2022 FINDINGS: Mediastinal blood pool activity: SUV max 1.67 Liver activity: SUV max NA NECK: No hypermetabolic lymph nodes in the neck. Incidental CT findings: Heterogeneous and enlarged left lobe of thyroid gland is again noted. This has been evaluated on previous imaging. (ref: J Am Coll Radiol. 2015 Feb;12(2): 143-50). CHEST: No hypermetabolic mediastinal or hilar nodes. No suspicious pulmonary nodules on the CT scan. Incidental CT  findings: Persistent diffuse ground-glass and airspace disease within the left upper lobe and left lower lobe identified. Interval resolution of previous right lung ground-glass and airspace densities. ABDOMEN/PELVIS: Right lobe of liver mass scratch set tracer avid lesion scratch set tracer avid mass within the right hepatic lobe is again seen. This is somewhat ill-defined measuring approximately 8.2 x 5.6 cm within SUV max of 5.88, image 149/2. On the prior PET-CT this measured approximately 6.6 x 4.2 cm with SUV max of 7.09. No abnormal tracer uptake identified within the pancreas, spleen or adrenal glands. No tracer avid abdominopelvic lymph nodes. Incidental CT findings: Prominent upper abdominal nodes are again noted as seen previously, including reference gastrohepatic ligament lymph node measuring 1.7 cm with SUV max of 1.87, image, image 155/2. Previously this measured 1.9 cm with SUV max of 1.58. Unchanged right kidney cyst. No follow-up imaging recommended. SKELETON: Multifocal tracer avid bone metastases are again noted. Index lesions include: -expansile lytic lesion involving the right T1 transverse process with SUV max of 6.52, image 59/2. Previously this had an SUV max of 6.82. -Lytic lesion associated with the base of the left coracoid process has an SUV max of 2.67, image 67/2. Previously 4.25. -Expansile lesion involving the right ischial  tuberosity measures 2.4 cm within SUV max of 6.41, image 268/2. On the prior exam this measured 1.3 cm within SUV max of 4.08. -Tracer avid lesion involving the left ischial tuberosity has an SUV max of 3.28, image 265/2 Unchanged from previous exam. -New lesion within the posterior left iliac bone has an SUV max of 5.67, image 243/2. Incidental CT findings: None. IMPRESSION: 1. There is been mild increase in size of the tracer avid mass within right lobe of liver compatible with known cholangiocarcinoma. The degree of tracer uptake however is slightly decreased  in the interval. 2. Multifocal tracer avid bone metastases are again noted. When compared with the previous exam there has been an mixed interval response to therapy. Specifically, there has been interval increase in size and FDG uptake associated with the right ischial tuberosity lesion and there is a new lesion within the posterior left iliac bone. Decreased tracer uptake is associated with the left scapular lesion and there is stable tracer uptake associated with the right T1 transverse process lesion. 3. Persistent ground-glass and airspace densities within the left upper lobe and left lower lobe compatible with inflammatory or infectious process. Interval resolution of previous right lung ground-glass and airspace densities. Electronically Signed   By: Kerby Moors M.D.   On: 12/11/2022 06:37

## 2023-01-03 ENCOUNTER — Inpatient Hospital Stay: Payer: BC Managed Care – PPO

## 2023-01-03 VITALS — BP 158/62 | HR 84 | Temp 98.9°F | Resp 18 | Wt 296.7 lb

## 2023-01-03 DIAGNOSIS — C221 Intrahepatic bile duct carcinoma: Secondary | ICD-10-CM | POA: Diagnosis not present

## 2023-01-03 DIAGNOSIS — C7951 Secondary malignant neoplasm of bone: Secondary | ICD-10-CM | POA: Insufficient documentation

## 2023-01-03 DIAGNOSIS — D6861 Antiphospholipid syndrome: Secondary | ICD-10-CM | POA: Diagnosis not present

## 2023-01-03 DIAGNOSIS — Z5112 Encounter for antineoplastic immunotherapy: Secondary | ICD-10-CM | POA: Diagnosis not present

## 2023-01-03 DIAGNOSIS — Z510 Encounter for antineoplastic radiation therapy: Secondary | ICD-10-CM | POA: Diagnosis not present

## 2023-01-03 DIAGNOSIS — Z86718 Personal history of other venous thrombosis and embolism: Secondary | ICD-10-CM | POA: Diagnosis not present

## 2023-01-03 DIAGNOSIS — Z452 Encounter for adjustment and management of vascular access device: Secondary | ICD-10-CM | POA: Diagnosis not present

## 2023-01-03 DIAGNOSIS — Z5111 Encounter for antineoplastic chemotherapy: Secondary | ICD-10-CM | POA: Diagnosis not present

## 2023-01-03 DIAGNOSIS — D6481 Anemia due to antineoplastic chemotherapy: Secondary | ICD-10-CM | POA: Diagnosis not present

## 2023-01-03 DIAGNOSIS — Z7901 Long term (current) use of anticoagulants: Secondary | ICD-10-CM | POA: Diagnosis not present

## 2023-01-03 MED ORDER — SODIUM CHLORIDE 0.9 % IV SOLN
Freq: Once | INTRAVENOUS | Status: AC
  Filled 2023-01-03: qty 250

## 2023-01-03 MED ORDER — SODIUM CHLORIDE 0.9 % IV SOLN
10.0000 mg/kg | Freq: Once | INTRAVENOUS | Status: AC
  Administered 2023-01-03: 1360 mg via INTRAVENOUS
  Filled 2023-01-03: qty 20

## 2023-01-03 MED ORDER — PALONOSETRON HCL INJECTION 0.25 MG/5ML
0.2500 mg | Freq: Once | INTRAVENOUS | Status: AC
  Administered 2023-01-03: 0.25 mg via INTRAVENOUS
  Filled 2023-01-03: qty 5

## 2023-01-03 MED ORDER — SODIUM CHLORIDE 0.9 % IV SOLN
150.0000 mg | Freq: Once | INTRAVENOUS | Status: AC
  Administered 2023-01-03: 150 mg via INTRAVENOUS
  Filled 2023-01-03: qty 150

## 2023-01-03 MED ORDER — HEPARIN SOD (PORK) LOCK FLUSH 100 UNIT/ML IV SOLN
500.0000 [IU] | Freq: Once | INTRAVENOUS | Status: AC | PRN
  Administered 2023-01-03: 500 [IU]
  Filled 2023-01-03: qty 5

## 2023-01-03 MED ORDER — POTASSIUM CHLORIDE IN NACL 20-0.9 MEQ/L-% IV SOLN
Freq: Once | INTRAVENOUS | Status: AC
  Filled 2023-01-03: qty 1000

## 2023-01-03 MED ORDER — SODIUM CHLORIDE 0.9 % IV SOLN
25.0000 mg/m2 | Freq: Once | INTRAVENOUS | Status: AC
  Administered 2023-01-03: 66 mg via INTRAVENOUS
  Filled 2023-01-03: qty 66

## 2023-01-03 MED ORDER — SODIUM CHLORIDE 0.9 % IV SOLN
10.0000 mg | Freq: Once | INTRAVENOUS | Status: AC
  Administered 2023-01-03: 10 mg via INTRAVENOUS
  Filled 2023-01-03: qty 10

## 2023-01-03 MED ORDER — SODIUM CHLORIDE 0.9 % IV SOLN
1000.0000 mg/m2 | Freq: Once | INTRAVENOUS | Status: AC
  Administered 2023-01-03: 2660 mg via INTRAVENOUS
  Filled 2023-01-03: qty 69.96

## 2023-01-03 MED ORDER — MAGNESIUM SULFATE 2 GM/50ML IV SOLN
2.0000 g | Freq: Once | INTRAVENOUS | Status: AC
  Administered 2023-01-03: 2 g via INTRAVENOUS
  Filled 2023-01-03: qty 50

## 2023-01-03 NOTE — Patient Instructions (Signed)
[facility]  Discharge Instructions: Thank you for choosing [facility] to provide your oncology and hematology care.  If you have a lab appointment with the [facility], please go directly to the [facility] and check in at the registration area.  Wear comfortable clothing and clothing appropriate for easy access to any Portacath or PICC line.   We strive to give you quality time with your provider. You may need to reschedule your appointment if you arrive late (15 or more minutes).  Arriving late affects you and other patients whose appointments are after yours.  Also, if you miss three or more appointments without notifying the office, you may be dismissed from the clinic at the provider's discretion.      For prescription refill requests, have your pharmacy contact our office and allow 72 hours for refills to be completed.    Today you received the following chemotherapy and/or immunotherapy agents Imfinzi, Gemzar, Cisplatin      To help prevent nausea and vomiting after your treatment, we encourage you to take your nausea medication as directed.  BELOW ARE SYMPTOMS THAT SHOULD BE REPORTED IMMEDIATELY: *FEVER GREATER THAN 100.4 F (38 C) OR HIGHER *CHILLS OR SWEATING *NAUSEA AND VOMITING THAT IS NOT CONTROLLED WITH YOUR NAUSEA MEDICATION *UNUSUAL SHORTNESS OF BREATH *UNUSUAL BRUISING OR BLEEDING *URINARY PROBLEMS (pain or burning when urinating, or frequent urination) *BOWEL PROBLEMS (unusual diarrhea, constipation, pain near the anus) TENDERNESS IN MOUTH AND THROAT WITH OR WITHOUT PRESENCE OF ULCERS (sore throat, sores in mouth, or a toothache) UNUSUAL RASH, SWELLING OR PAIN  UNUSUAL VAGINAL DISCHARGE OR ITCHING   Items with * indicate a potential emergency and should be followed up as soon as possible or go to the Emergency Department if any problems should occur.  Please show the CHEMOTHERAPY ALERT CARD or IMMUNOTHERAPY ALERT  CARD at check-in to the Emergency Department and triage nurse.  Should you have questions after your visit or need to cancel or reschedule your appointment, please contact [facility]  [phone] and follow the prompts.  Office hours are 8:00 a.m. to 4:30 p.m. Monday - Friday. Please note that voicemails left after 4:00 p.m. may not be returned until the following business day.  We are closed weekends and major holidays. You have access to a nurse at all times for urgent questions. Please call the main number to the clinic [phone] and follow the prompts.  For any non-urgent questions, you may also contact your provider using MyChart. We now offer e-Visits for anyone 71 and older to request care online for non-urgent symptoms. For details visit [email or web address].   Also download the MyChart app! Go to the app store, search "MyChart", open the app, select [facility], and log in with your MyChart username and password.

## 2023-01-03 NOTE — Progress Notes (Signed)
Per Dr. Tasia Catchings, ok to run post hydration fluids concurrently with cisplatin.

## 2023-01-04 LAB — CEA: CEA: 15.7 ng/mL — ABNORMAL HIGH (ref 0.0–4.7)

## 2023-01-04 LAB — CANCER ANTIGEN 19-9: CA 19-9: 134 U/mL — ABNORMAL HIGH (ref 0–35)

## 2023-01-07 ENCOUNTER — Ambulatory Visit
Admission: RE | Admit: 2023-01-07 | Discharge: 2023-01-07 | Disposition: A | Discharge status: Home / Self Care | Payer: BC Managed Care – PPO | Source: Ambulatory Visit | Attending: Radiation Oncology | Admitting: Radiation Oncology

## 2023-01-07 ENCOUNTER — Other Ambulatory Visit: Payer: Self-pay

## 2023-01-07 DIAGNOSIS — C7951 Secondary malignant neoplasm of bone: Secondary | ICD-10-CM | POA: Diagnosis not present

## 2023-01-07 DIAGNOSIS — Z510 Encounter for antineoplastic radiation therapy: Secondary | ICD-10-CM | POA: Diagnosis not present

## 2023-01-07 DIAGNOSIS — C221 Intrahepatic bile duct carcinoma: Secondary | ICD-10-CM | POA: Diagnosis not present

## 2023-01-07 LAB — RAD ONC ARIA SESSION SUMMARY
Course Elapsed Days: 0
Plan Fractions Treated to Date: 1
Plan Prescribed Dose Per Fraction: 6 Gy
Plan Total Fractions Prescribed: 5
Plan Total Prescribed Dose: 30 Gy
Reference Point Dosage Given to Date: 6 Gy
Reference Point Session Dosage Given: 6 Gy
Session Number: 1

## 2023-01-09 ENCOUNTER — Other Ambulatory Visit: Payer: Self-pay

## 2023-01-09 ENCOUNTER — Ambulatory Visit
Admission: RE | Admit: 2023-01-09 | Discharge: 2023-01-09 | Disposition: A | Discharge status: Home / Self Care | Payer: BC Managed Care – PPO | Source: Ambulatory Visit | Attending: Radiation Oncology | Admitting: Radiation Oncology

## 2023-01-09 DIAGNOSIS — C221 Intrahepatic bile duct carcinoma: Secondary | ICD-10-CM | POA: Diagnosis not present

## 2023-01-09 DIAGNOSIS — C7951 Secondary malignant neoplasm of bone: Secondary | ICD-10-CM | POA: Diagnosis not present

## 2023-01-09 DIAGNOSIS — Z510 Encounter for antineoplastic radiation therapy: Secondary | ICD-10-CM | POA: Diagnosis not present

## 2023-01-09 LAB — RAD ONC ARIA SESSION SUMMARY
Course Elapsed Days: 2
Plan Fractions Treated to Date: 2
Plan Prescribed Dose Per Fraction: 6 Gy
Plan Total Fractions Prescribed: 5
Plan Total Prescribed Dose: 30 Gy
Reference Point Dosage Given to Date: 12 Gy
Reference Point Session Dosage Given: 6 Gy
Session Number: 2

## 2023-01-09 MED FILL — Fosaprepitant Dimeglumine For IV Infusion 150 MG (Base Eq): INTRAVENOUS | Qty: 5 | Status: AC

## 2023-01-09 MED FILL — Dexamethasone Sodium Phosphate Inj 100 MG/10ML: INTRAMUSCULAR | Qty: 1 | Status: AC

## 2023-01-10 ENCOUNTER — Inpatient Hospital Stay: Payer: BC Managed Care – PPO

## 2023-01-10 ENCOUNTER — Other Ambulatory Visit: Payer: Self-pay | Admitting: Oncology

## 2023-01-10 VITALS — BP 137/66 | HR 84 | Temp 97.8°F | Resp 16

## 2023-01-10 DIAGNOSIS — Z5112 Encounter for antineoplastic immunotherapy: Secondary | ICD-10-CM | POA: Diagnosis not present

## 2023-01-10 DIAGNOSIS — C221 Intrahepatic bile duct carcinoma: Secondary | ICD-10-CM | POA: Diagnosis not present

## 2023-01-10 DIAGNOSIS — D6481 Anemia due to antineoplastic chemotherapy: Secondary | ICD-10-CM | POA: Diagnosis not present

## 2023-01-10 DIAGNOSIS — Z7901 Long term (current) use of anticoagulants: Secondary | ICD-10-CM | POA: Diagnosis not present

## 2023-01-10 DIAGNOSIS — Z452 Encounter for adjustment and management of vascular access device: Secondary | ICD-10-CM | POA: Diagnosis not present

## 2023-01-10 DIAGNOSIS — Z86718 Personal history of other venous thrombosis and embolism: Secondary | ICD-10-CM | POA: Diagnosis not present

## 2023-01-10 DIAGNOSIS — D6861 Antiphospholipid syndrome: Secondary | ICD-10-CM | POA: Diagnosis not present

## 2023-01-10 DIAGNOSIS — Z5111 Encounter for antineoplastic chemotherapy: Secondary | ICD-10-CM | POA: Diagnosis not present

## 2023-01-10 DIAGNOSIS — C7951 Secondary malignant neoplasm of bone: Secondary | ICD-10-CM | POA: Diagnosis not present

## 2023-01-10 LAB — CBC WITH DIFFERENTIAL/PLATELET
Abs Immature Granulocytes: 0.21 10*3/uL — ABNORMAL HIGH (ref 0.00–0.07)
Basophils Absolute: 0 10*3/uL (ref 0.0–0.1)
Basophils Relative: 1 %
Eosinophils Absolute: 0 10*3/uL (ref 0.0–0.5)
Eosinophils Relative: 0 %
HCT: 31.1 % — ABNORMAL LOW (ref 39.0–52.0)
Hemoglobin: 10.4 g/dL — ABNORMAL LOW (ref 13.0–17.0)
Immature Granulocytes: 8 %
Lymphocytes Relative: 9 %
Lymphs Abs: 0.3 10*3/uL — ABNORMAL LOW (ref 0.7–4.0)
MCH: 31.9 pg (ref 26.0–34.0)
MCHC: 33.4 g/dL (ref 30.0–36.0)
MCV: 95.4 fL (ref 80.0–100.0)
Monocytes Absolute: 0.4 10*3/uL (ref 0.1–1.0)
Monocytes Relative: 14 %
Neutro Abs: 1.8 10*3/uL (ref 1.7–7.7)
Neutrophils Relative %: 68 %
Platelets: 250 10*3/uL (ref 150–400)
RBC: 3.26 MIL/uL — ABNORMAL LOW (ref 4.22–5.81)
RDW: 16.5 % — ABNORMAL HIGH (ref 11.5–15.5)
Smear Review: NORMAL
WBC: 2.7 10*3/uL — ABNORMAL LOW (ref 4.0–10.5)
nRBC: 3.3 % — ABNORMAL HIGH (ref 0.0–0.2)

## 2023-01-10 LAB — COMPREHENSIVE METABOLIC PANEL
ALT: 31 U/L (ref 0–44)
AST: 26 U/L (ref 15–41)
Albumin: 3.6 g/dL (ref 3.5–5.0)
Alkaline Phosphatase: 60 U/L (ref 38–126)
Anion gap: 9 (ref 5–15)
BUN: 7 mg/dL — ABNORMAL LOW (ref 8–23)
CO2: 24 mmol/L (ref 22–32)
Calcium: 8.4 mg/dL — ABNORMAL LOW (ref 8.9–10.3)
Chloride: 100 mmol/L (ref 98–111)
Creatinine, Ser: 0.73 mg/dL (ref 0.61–1.24)
GFR, Estimated: >60 mL/min (ref >60)
Glucose, Bld: 141 mg/dL — ABNORMAL HIGH (ref 70–99)
Potassium: 3.5 mmol/L (ref 3.5–5.1)
Sodium: 133 mmol/L — ABNORMAL LOW (ref 135–145)
Total Bilirubin: 0.6 mg/dL (ref 0.3–1.2)
Total Protein: 6.1 g/dL — ABNORMAL LOW (ref 6.5–8.1)

## 2023-01-10 LAB — MAGNESIUM: Magnesium: 1.6 mg/dL — ABNORMAL LOW (ref 1.7–2.4)

## 2023-01-10 MED ORDER — SODIUM CHLORIDE 0.9% FLUSH
10.0000 mL | INTRAVENOUS | Status: DC | PRN
  Administered 2023-01-10: 10 mL
  Filled 2023-01-10: qty 10

## 2023-01-10 MED ORDER — SODIUM CHLORIDE 0.9 % IV SOLN
1000.0000 mg/m2 | Freq: Once | INTRAVENOUS | Status: AC
  Administered 2023-01-10: 2660 mg via INTRAVENOUS
  Filled 2023-01-10: qty 52.57

## 2023-01-10 MED ORDER — SODIUM CHLORIDE 0.9 % IV SOLN
25.0000 mg/m2 | Freq: Once | INTRAVENOUS | Status: AC
  Administered 2023-01-10: 66 mg via INTRAVENOUS
  Filled 2023-01-10: qty 66

## 2023-01-10 MED ORDER — SODIUM CHLORIDE 0.9 % IV SOLN
Freq: Once | INTRAVENOUS | Status: AC
  Filled 2023-01-10: qty 250

## 2023-01-10 MED ORDER — SODIUM CHLORIDE 0.9 % IV SOLN
Freq: Once | INTRAVENOUS | Status: DC
  Filled 2023-01-10: qty 250

## 2023-01-10 MED ORDER — MAGNESIUM SULFATE 2 GM/50ML IV SOLN
2.0000 g | Freq: Once | INTRAVENOUS | Status: AC
  Administered 2023-01-10: 2 g via INTRAVENOUS
  Filled 2023-01-10: qty 50

## 2023-01-10 MED ORDER — POTASSIUM CHLORIDE IN NACL 20-0.9 MEQ/L-% IV SOLN
Freq: Once | INTRAVENOUS | Status: AC
  Filled 2023-01-10: qty 1000

## 2023-01-10 MED ORDER — PALONOSETRON HCL INJECTION 0.25 MG/5ML
0.2500 mg | Freq: Once | INTRAVENOUS | Status: AC
  Administered 2023-01-10: 0.25 mg via INTRAVENOUS
  Filled 2023-01-10: qty 5

## 2023-01-10 MED ORDER — SODIUM CHLORIDE 0.9 % IV SOLN
10.0000 mg | Freq: Once | INTRAVENOUS | Status: AC
  Administered 2023-01-10: 10 mg via INTRAVENOUS
  Filled 2023-01-10: qty 10

## 2023-01-10 MED ORDER — SODIUM CHLORIDE 0.9 % IV SOLN
150.0000 mg | Freq: Once | INTRAVENOUS | Status: AC
  Administered 2023-01-10: 150 mg via INTRAVENOUS
  Filled 2023-01-10: qty 150

## 2023-01-10 MED ORDER — HEPARIN SOD (PORK) LOCK FLUSH 100 UNIT/ML IV SOLN
500.0000 [IU] | Freq: Once | INTRAVENOUS | Status: AC | PRN
  Administered 2023-01-10: 500 [IU]
  Filled 2023-01-10: qty 5

## 2023-01-10 NOTE — Patient Instructions (Signed)
[facility]  Discharge Instructions: Thank you for choosing [facility] to provide your oncology and hematology care.  If you have a lab appointment with the Cancer Center, please go directly to the Cancer Center and check in at the registration area.  Wear comfortable clothing and clothing appropriate for easy access to any Portacath or PICC line.   We strive to give you quality time with your provider. You may need to reschedule your appointment if you arrive late (15 or more minutes).  Arriving late affects you and other patients whose appointments are after yours.  Also, if you miss three or more appointments without notifying the office, you may be dismissed from the clinic at the provider's discretion.      For prescription refill requests, have your pharmacy contact our office and allow 72 hours for refills to be completed.    Today you received the following chemotherapy and/or immunotherapy agents Imfinzi, Gemzar, Cisplatin      To help prevent nausea and vomiting after your treatment, we encourage you to take your nausea medication as directed.  BELOW ARE SYMPTOMS THAT SHOULD BE REPORTED IMMEDIATELY: *FEVER GREATER THAN 100.4 F (38 C) OR HIGHER *CHILLS OR SWEATING *NAUSEA AND VOMITING THAT IS NOT CONTROLLED WITH YOUR NAUSEA MEDICATION *UNUSUAL SHORTNESS OF BREATH *UNUSUAL BRUISING OR BLEEDING *URINARY PROBLEMS (pain or burning when urinating, or frequent urination) *BOWEL PROBLEMS (unusual diarrhea, constipation, pain near the anus) TENDERNESS IN MOUTH AND THROAT WITH OR WITHOUT PRESENCE OF ULCERS (sore throat, sores in mouth, or a toothache) UNUSUAL RASH, SWELLING OR PAIN  UNUSUAL VAGINAL DISCHARGE OR ITCHING   Items with * indicate a potential emergency and should be followed up as soon as possible or go to the Emergency Department if any problems should occur.  Please show the CHEMOTHERAPY ALERT CARD or IMMUNOTHERAPY ALERT  CARD at check-in to the Emergency Department and triage nurse.  Should you have questions after your visit or need to cancel or reschedule your appointment, please contact [facility]  [phone] and follow the prompts.  Office hours are 8:00 a.m. to 4:30 p.m. Monday - Friday. Please note that voicemails left after 4:00 p.m. may not be returned until the following business day.  We are closed weekends and major holidays. You have access to a nurse at all times for urgent questions. Please call the main number to the clinic [phone] and follow the prompts.  For any non-urgent questions, you may also contact your provider using MyChart. We now offer e-Visits for anyone 18 and older to request care online for non-urgent symptoms. For details visit [email or web address].   Also download the MyChart app! Go to the app store, search "MyChart", open the app, select [facility], and log in with your MyChart username and password.    

## 2023-01-10 NOTE — Progress Notes (Signed)
Ok to run post hydration fluids with cisplatin per Dr. Tasia Catchings

## 2023-01-10 NOTE — Patient Instructions (Signed)

## 2023-01-11 ENCOUNTER — Encounter: Payer: Self-pay | Admitting: Oncology

## 2023-01-15 ENCOUNTER — Ambulatory Visit
Admission: RE | Admit: 2023-01-15 | Discharge: 2023-01-15 | Disposition: A | Discharge status: Home / Self Care | Payer: BC Managed Care – PPO | Source: Ambulatory Visit | Attending: Radiation Oncology | Admitting: Radiation Oncology

## 2023-01-15 ENCOUNTER — Other Ambulatory Visit: Payer: Self-pay

## 2023-01-15 ENCOUNTER — Encounter: Payer: Self-pay | Admitting: Oncology

## 2023-01-15 DIAGNOSIS — C221 Intrahepatic bile duct carcinoma: Secondary | ICD-10-CM | POA: Diagnosis not present

## 2023-01-15 DIAGNOSIS — Z510 Encounter for antineoplastic radiation therapy: Secondary | ICD-10-CM | POA: Diagnosis not present

## 2023-01-15 DIAGNOSIS — C7951 Secondary malignant neoplasm of bone: Secondary | ICD-10-CM | POA: Diagnosis not present

## 2023-01-15 LAB — RAD ONC ARIA SESSION SUMMARY
Course Elapsed Days: 8
Plan Fractions Treated to Date: 3
Plan Prescribed Dose Per Fraction: 6 Gy
Plan Total Fractions Prescribed: 5
Plan Total Prescribed Dose: 30 Gy
Reference Point Dosage Given to Date: 18 Gy
Reference Point Session Dosage Given: 6 Gy
Session Number: 3

## 2023-01-16 ENCOUNTER — Telehealth: Payer: Self-pay

## 2023-01-16 DIAGNOSIS — C221 Intrahepatic bile duct carcinoma: Secondary | ICD-10-CM

## 2023-01-16 NOTE — Telephone Encounter (Signed)
-----   Message from Earlie Server, MD sent at 01/15/2023 11:07 PM EDT ----- Please let him know that I discussed with [facility] oncology.  I will obtain a repeat CT chest abdomen pelvis with contrast asap, prefer to have CT done a few days prior to his next visit with me. Thanks.

## 2023-01-16 NOTE — Telephone Encounter (Signed)
Unable to reach pt by phone, detailed VM left and Mychart message sent as well.   Pt will need CT CAP prior to appt on 3/27 if possible.

## 2023-01-17 ENCOUNTER — Encounter: Payer: Self-pay | Admitting: Oncology

## 2023-01-17 ENCOUNTER — Other Ambulatory Visit: Payer: Self-pay

## 2023-01-17 ENCOUNTER — Ambulatory Visit
Admission: RE | Admit: 2023-01-17 | Discharge: 2023-01-17 | Disposition: A | Discharge status: Home / Self Care | Payer: BC Managed Care – PPO | Source: Ambulatory Visit | Attending: Radiation Oncology | Admitting: Radiation Oncology

## 2023-01-17 DIAGNOSIS — C221 Intrahepatic bile duct carcinoma: Secondary | ICD-10-CM | POA: Diagnosis not present

## 2023-01-17 DIAGNOSIS — C7951 Secondary malignant neoplasm of bone: Secondary | ICD-10-CM | POA: Diagnosis not present

## 2023-01-17 DIAGNOSIS — Z510 Encounter for antineoplastic radiation therapy: Secondary | ICD-10-CM | POA: Diagnosis not present

## 2023-01-17 LAB — RAD ONC ARIA SESSION SUMMARY
Course Elapsed Days: 10
Plan Fractions Treated to Date: 4
Plan Prescribed Dose Per Fraction: 6 Gy
Plan Total Fractions Prescribed: 5
Plan Total Prescribed Dose: 30 Gy
Reference Point Dosage Given to Date: 24 Gy
Reference Point Session Dosage Given: 6 Gy
Session Number: 4

## 2023-01-22 ENCOUNTER — Other Ambulatory Visit: Payer: Self-pay

## 2023-01-22 ENCOUNTER — Ambulatory Visit
Admission: RE | Admit: 2023-01-22 | Discharge: 2023-01-22 | Disposition: A | Discharge status: Home / Self Care | Payer: BC Managed Care – PPO | Source: Ambulatory Visit | Attending: Radiation Oncology | Admitting: Radiation Oncology

## 2023-01-22 ENCOUNTER — Ambulatory Visit
Admission: RE | Admit: 2023-01-22 | Discharge: 2023-01-22 | Disposition: A | Discharge status: Home / Self Care | Payer: BC Managed Care – PPO | Source: Ambulatory Visit | Attending: Oncology | Admitting: Oncology

## 2023-01-22 DIAGNOSIS — K769 Liver disease, unspecified: Secondary | ICD-10-CM | POA: Diagnosis not present

## 2023-01-22 DIAGNOSIS — R918 Other nonspecific abnormal finding of lung field: Secondary | ICD-10-CM | POA: Diagnosis not present

## 2023-01-22 DIAGNOSIS — J439 Emphysema, unspecified: Secondary | ICD-10-CM | POA: Diagnosis not present

## 2023-01-22 DIAGNOSIS — N281 Cyst of kidney, acquired: Secondary | ICD-10-CM | POA: Diagnosis not present

## 2023-01-22 DIAGNOSIS — C221 Intrahepatic bile duct carcinoma: Secondary | ICD-10-CM | POA: Diagnosis not present

## 2023-01-22 DIAGNOSIS — C7951 Secondary malignant neoplasm of bone: Secondary | ICD-10-CM | POA: Diagnosis not present

## 2023-01-22 DIAGNOSIS — Z510 Encounter for antineoplastic radiation therapy: Secondary | ICD-10-CM | POA: Diagnosis not present

## 2023-01-22 LAB — RAD ONC ARIA SESSION SUMMARY
Course Elapsed Days: 15
Plan Fractions Treated to Date: 5
Plan Prescribed Dose Per Fraction: 6 Gy
Plan Total Fractions Prescribed: 5
Plan Total Prescribed Dose: 30 Gy
Reference Point Dosage Given to Date: 30 Gy
Reference Point Session Dosage Given: 6 Gy
Session Number: 5

## 2023-01-22 MED ORDER — IOHEXOL 300 MG/ML  SOLN
100.0000 mL | Freq: Once | INTRAMUSCULAR | Status: AC | PRN
  Administered 2023-01-22: 100 mL via INTRAVENOUS

## 2023-01-22 MED ORDER — HEPARIN SOD (PORK) LOCK FLUSH 100 UNIT/ML IV SOLN
500.0000 [IU] | Freq: Once | INTRAVENOUS | Status: AC
  Administered 2023-01-22: 500 [IU] via INTRAVENOUS

## 2023-01-23 ENCOUNTER — Encounter: Payer: Self-pay | Admitting: Oncology

## 2023-01-23 ENCOUNTER — Inpatient Hospital Stay (HOSPITAL_BASED_OUTPATIENT_CLINIC_OR_DEPARTMENT_OTHER): Payer: BC Managed Care – PPO | Admitting: Oncology

## 2023-01-23 ENCOUNTER — Inpatient Hospital Stay: Payer: BC Managed Care – PPO

## 2023-01-23 ENCOUNTER — Other Ambulatory Visit: Payer: Self-pay

## 2023-01-23 VITALS — BP 142/76 | HR 76 | Temp 98.1°F | Resp 18 | Wt 297.6 lb

## 2023-01-23 DIAGNOSIS — Z5112 Encounter for antineoplastic immunotherapy: Secondary | ICD-10-CM | POA: Diagnosis not present

## 2023-01-23 DIAGNOSIS — R918 Other nonspecific abnormal finding of lung field: Secondary | ICD-10-CM

## 2023-01-23 DIAGNOSIS — C7951 Secondary malignant neoplasm of bone: Secondary | ICD-10-CM | POA: Diagnosis not present

## 2023-01-23 DIAGNOSIS — D6861 Antiphospholipid syndrome: Secondary | ICD-10-CM

## 2023-01-23 DIAGNOSIS — D6481 Anemia due to antineoplastic chemotherapy: Secondary | ICD-10-CM

## 2023-01-23 DIAGNOSIS — Z452 Encounter for adjustment and management of vascular access device: Secondary | ICD-10-CM | POA: Diagnosis not present

## 2023-01-23 DIAGNOSIS — Z86718 Personal history of other venous thrombosis and embolism: Secondary | ICD-10-CM | POA: Diagnosis not present

## 2023-01-23 DIAGNOSIS — Z5111 Encounter for antineoplastic chemotherapy: Secondary | ICD-10-CM | POA: Diagnosis not present

## 2023-01-23 DIAGNOSIS — Z7901 Long term (current) use of anticoagulants: Secondary | ICD-10-CM | POA: Diagnosis not present

## 2023-01-23 DIAGNOSIS — C221 Intrahepatic bile duct carcinoma: Secondary | ICD-10-CM

## 2023-01-23 DIAGNOSIS — Z95828 Presence of other vascular implants and grafts: Secondary | ICD-10-CM

## 2023-01-23 DIAGNOSIS — T451X5A Adverse effect of antineoplastic and immunosuppressive drugs, initial encounter: Secondary | ICD-10-CM

## 2023-01-23 LAB — CBC WITH DIFFERENTIAL/PLATELET
Abs Immature Granulocytes: 0.01 10*3/uL (ref 0.00–0.07)
Basophils Absolute: 0 10*3/uL (ref 0.0–0.1)
Basophils Relative: 0 %
Eosinophils Absolute: 0 10*3/uL (ref 0.0–0.5)
Eosinophils Relative: 0 %
HCT: 28 % — ABNORMAL LOW (ref 39.0–52.0)
Hemoglobin: 8.9 g/dL — ABNORMAL LOW (ref 13.0–17.0)
Immature Granulocytes: 0 %
Lymphocytes Relative: 12 %
Lymphs Abs: 0.3 10*3/uL — ABNORMAL LOW (ref 0.7–4.0)
MCH: 32.1 pg (ref 26.0–34.0)
MCHC: 31.8 g/dL (ref 30.0–36.0)
MCV: 101.1 fL — ABNORMAL HIGH (ref 80.0–100.0)
Monocytes Absolute: 0.5 10*3/uL (ref 0.1–1.0)
Monocytes Relative: 18 %
Neutro Abs: 1.9 10*3/uL (ref 1.7–7.7)
Neutrophils Relative %: 70 %
Platelets: 144 10*3/uL — ABNORMAL LOW (ref 150–400)
RBC: 2.77 MIL/uL — ABNORMAL LOW (ref 4.22–5.81)
RDW: 17.2 % — ABNORMAL HIGH (ref 11.5–15.5)
WBC: 2.7 10*3/uL — ABNORMAL LOW (ref 4.0–10.5)
nRBC: 0 % (ref 0.0–0.2)

## 2023-01-23 LAB — COMPREHENSIVE METABOLIC PANEL
ALT: 19 U/L (ref 0–44)
AST: 23 U/L (ref 15–41)
Albumin: 3.3 g/dL — ABNORMAL LOW (ref 3.5–5.0)
Alkaline Phosphatase: 55 U/L (ref 38–126)
Anion gap: 7 (ref 5–15)
BUN: 6 mg/dL — ABNORMAL LOW (ref 8–23)
CO2: 25 mmol/L (ref 22–32)
Calcium: 8.5 mg/dL — ABNORMAL LOW (ref 8.9–10.3)
Chloride: 103 mmol/L (ref 98–111)
Creatinine, Ser: 0.82 mg/dL (ref 0.61–1.24)
GFR, Estimated: >60 mL/min (ref >60)
Glucose, Bld: 152 mg/dL — ABNORMAL HIGH (ref 70–99)
Potassium: 3.7 mmol/L (ref 3.5–5.1)
Sodium: 135 mmol/L (ref 135–145)
Total Bilirubin: 0.5 mg/dL (ref 0.3–1.2)
Total Protein: 5.9 g/dL — ABNORMAL LOW (ref 6.5–8.1)

## 2023-01-23 LAB — MAGNESIUM: Magnesium: 1.6 mg/dL — ABNORMAL LOW (ref 1.7–2.4)

## 2023-01-23 MED ORDER — HEPARIN SOD (PORK) LOCK FLUSH 100 UNIT/ML IV SOLN
500.0000 [IU] | Freq: Once | INTRAVENOUS | Status: DC
  Filled 2023-01-23: qty 5

## 2023-01-23 MED ORDER — SODIUM CHLORIDE 0.9% FLUSH
10.0000 mL | INTRAVENOUS | Status: DC | PRN
  Filled 2023-01-23: qty 10

## 2023-01-23 MED FILL — Dexamethasone Sodium Phosphate Inj 100 MG/10ML: INTRAMUSCULAR | Qty: 1 | Status: AC

## 2023-01-23 NOTE — Assessment & Plan Note (Addendum)
Imaging findings and pathology results were reviewed with patient.  Clinical diagnosis is cholangiocarcinoma, although atypical with both normal CEA and CA 19.9 Interim PET scan after 3 cycles of treatment showed mixed response- decreased hypermetabolism but increased size.   He has had new bone lesions. Refer to radiation oncology for feasibility of treating new bone lesions.  For now I will continue current regimen with short-term repeat imaging. Labs are reviewed and discussed with patient. S/p 5 cycles  Gemcitabine, Cisplatin and Durvalumab.  Overall he tolerates very well.  Proceed with cycle 5 D1 Cisplatin Gemcitabine Durvalumab, he will return in 1 week of D8 Gemcitabine and Cisplatin treatment.  Repeat CT scan images were reviewed and discussed with patient and wife. Stable disease.  Liver mass has slightly decreased in size.  Stable lymphadenopathy.  Shared decision was made to continue current regimen for the 3 cycles and repeat imaging.   NGS-[facility] liquid biopsy FGFR2 -ADK mutation, TMB 0,  2nd line options include Futibatinib, or Pemigatinib or Infigratinib-discussed about NGS results with [facility] oncology Dr. Colletta [place]

## 2023-01-23 NOTE — Assessment & Plan Note (Signed)
-   Continue calcium supplementation. 

## 2023-01-23 NOTE — Patient Instructions (Signed)

## 2023-01-23 NOTE — Assessment & Plan Note (Addendum)
Follow up with radiation oncology for palliative radiation to bone lesions.  Consider bisphosphonate after dental clearance is obtained. Refer to orthopedic surgeon for evaluation of need of stabilization procedures.

## 2023-01-23 NOTE — Assessment & Plan Note (Signed)
Increase slow Mag to twice daily 

## 2023-01-23 NOTE — Assessment & Plan Note (Signed)
Hemoglobin trended down.  Close monitor weekly.  Possible need of blood transfusion if hemoglobin drops below 8.

## 2023-01-23 NOTE — Assessment & Plan Note (Signed)
#  Recurrent lower extremity DVT, secondary to antiphospholipid syndrome.-.  Persistent lupus anticoagulant positive.Recommend life time anticoagulation Patient was previously on Coumadin now on Lovenox. continue Lovenox 1 mg/kg twice daily  

## 2023-01-23 NOTE — Progress Notes (Signed)
Patient here for follow up. Pt reports that his appetite isn't the best because he does not have a taste for a lot of foods, especially meats.

## 2023-01-23 NOTE — Progress Notes (Signed)
Hematology/Oncology Progress note [phone] [phone]    CHIEF COMPLAINTS/REASON FOR VISIT:  Follow up for right lower extremity DVT, antiphospholipid syndrome, metastatic cholangiocarcinoma.  ASSESSMENT & PLAN:   Cancer Staging  Cholangiocarcinoma [facility]) Staging form: Intrahepatic Bile Duct, AJCC 8th Edition - Clinical stage from 08/27/2022: Stage IV (cT2, cN1, cM1) - Signed by Earlie Server, MD on 09/13/2022   Cholangiocarcinoma [facility]) Imaging findings and pathology results were reviewed with patient.  Clinical diagnosis is cholangiocarcinoma, although atypical with both normal CEA and CA 19.9 Interim PET scan after 3 cycles of treatment showed mixed response- decreased hypermetabolism but increased size.   He has had new bone lesions. Refer to radiation oncology for feasibility of treating new bone lesions.  For now I will continue current regimen with short-term repeat imaging. Labs are reviewed and discussed with patient. S/p 5 cycles  Gemcitabine, Cisplatin and Durvalumab.  Overall he tolerates very well.  Proceed with cycle 5 D1 Cisplatin Gemcitabine Durvalumab, he will return in 1 week of D8 Gemcitabine and Cisplatin treatment.  Repeat CT scan images were reviewed and discussed with patient and wife. Stable disease.  Liver mass has slightly decreased in size.  Stable lymphadenopathy.  Shared decision was made to continue current regimen for the 3 cycles and repeat imaging.   NGS-Tempus liquid biopsy FGFR2 -ADK mutation, TMB 0,  2nd line options include Futibatinib, or Pemigatinib or Infigratinib-discussed about NGS results with [facility] oncology Dr. Colletta Maryland   Metastasis to bone [facility]) Follow up with radiation oncology for palliative radiation to bone lesions.  Consider bisphosphonate after dental clearance is obtained. Refer to orthopedic surgeon for evaluation of need of stabilization procedures.  Antiphospholipid syndrome (HCC) #Recurrent lower extremity DVT,  secondary to antiphospholipid syndrome.-.  Persistent lupus anticoagulant positive.Recommend life time anticoagulation Patient was previously on Coumadin now on Lovenox. continue Lovenox 1 mg/kg twice daily   Hypocalcemia Continue calcium supplementation.   Hypomagnesemia Increase slow Mag to twice daily   Lung infiltrate on CT He follows up with pulmonology.  repeat CT images were reviewed and discussed with patient. I also discussed with pulmonology Dr. Silas Flood who recommends surgical biopsy. DDX includes organizing pneumonia, ILD, aspiration, lymphatic spread, etc. He declined repeat bronchoscopy/biopsy.  Stable findings on most recent CT scan.  Continue to monitor.   Antineoplastic chemotherapy induced anemia Hemoglobin trended down.  Close monitor weekly.  Possible need of blood transfusion if hemoglobin drops below 8.   Orders Placed This Encounter  Procedures   Cancer antigen 19-9    Standing Status:   Future    Standing Expiration Date:   01/24/2024   CEA    Standing Status:   Future    Standing Expiration Date:   01/24/2024   Ambulatory referral to Orthopedic Surgery    Referral Priority:   Routine    Referral Type:   Surgical    Referral Reason:   Specialty Services Required    Requested Specialty:   Orthopedic Surgery    Number of Visits Requested:   1     Follow up Per LOS  All questions were answered. The patient knows to call the clinic with any problems, questions or concerns.  Earlie Server, MD, PhD [facility] Hematology Oncology 01/23/2023    HISTORY OF PRESENTING ILLNESS:  Patient reports remote history of left lower extremity DVT in 2002.  He was initiated on Lovenox and bridged to Coumadin.  Patient took warfarin for 2 years before anticoagulation was stopped. 12/03/2021, patient presented to emergency room  for evaluation of right lower extremity pain and swelling for about a week.  Started on the inner side of right thigh and migrated to the right calf.  + Associated with swelling.  Denies any recent injury, hospitalization, surgery.  He first noticed the symptoms after playing basketball with his grandson.   12/03/2021, right lower extremity ultrasound showed occlusive DVT extending from the mid aspect of the femoral vein through the imaged tibial vein.  Age-indeterminate. Patient was started on Xarelto. He was referred to establish care with vascular surgeon and was seen by [name] on 12/06/2021.  Shared decision was made not to proceed with embolectomy.  Continue anticoagulation. Patient was referred to hematology oncology for further evaluation.  Patient denies any family history of blood clots.  Denies any unintentional weight loss, fever or night sweats. 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  He sometimes stay in his car while waiting for next assessment appointment.  He reports the right lower extremity symptom has improved since the start of Xarelto.  No active bleeding events.  #12/18/2021 hypercoagulable work-up showed JAK2 V617F mutation negative, with reflex to other mutations CALR, MPL, JAK 2 Ex 12-15 mutations negative, negative anticardiolipin IgG and IgM antibodies, positive lupus anticoagulant, negative factor V Leiden mutation, negative prothrombin gene mutation, normal protein C activity, normal protein S antigen level. Patient was recommended to switch to Coumadin with INR goal of 2-3.  #03/15/2022, repeat lupus anticoagulant is persistently positive- + antiphospholipid syndrome.  Patient is currently on Lovenox 1 mg/kg twice daily.  admitted due to multifocal pneumonia, treated with Influenza panel negative, COVID-negative, HIV negative, Complete course of antibiotics with Zithromax, Vantin   + HCV antibody positive, HCV RNA quantification not detected. HCV RT PCR not detected.   INTERVAL HISTORY Christopher Marsh is a 62 y.o.  male who has above history reviewed by me today presents for follow up visit for management of right lower extremity DVT and antiphospholipid syndrome, metastatic cholangiocarcinoma.  Oncology History  Cholangiocarcinoma ([place])  10/30/2021 Imaging   CT chest wo contrast 1. Heterogeneous bilateral ground-glass disease, multilobar but worst in the right middle lobe, lingula and lower lobes. There is progression of the abnormality since 10/12/2022. Though pneumonia could produce this appearance, given persistence and fluctuating appearance since October, consider alternative entities such as hypersensitivity pneumonitis or idiopathic interstitial pneumonia. 2. Slightly enlarged pulmonary trunk, possible arterial hypertension 3. Redemonstrated large hypodense right hepatic lobe mass.   08/18/2022 Imaging   CT chest w contrast showed 1. Multifocal airspace disease in both lungs, left side greater than right. Findings are most compatible with multifocal pneumonia. 2. Multiple new hepatic lesions with enlarging lymph nodes in the upper abdomen and chest. Findings are concerning for metastatic disease. Based on the multifocal pneumonia, hepatic abscesses would also be in the differential diagnosis. Recommend further characterization of the abdomen and pelvis with CT with IV contrast. 3. Enlargement of the main pulmonary artery could be associated with pulmonary hypertension. 4. Coronary artery calcifications. 5. Multinodular goiter. Patient has known thyroid nodules and previous thyroid ultrasound.    08/18/2022 Imaging   CT abdomen pelvis w contrast  Interval development of 6.5 x 5.2 cm heterogeneously enhancing mass in right hepatic lobe. This is highly concerning for neoplasm or malignancy, and further evaluation with MRI is recommended.   Mildly enlarged periaortic adenopathy is noted, with the largest lymph node measuring 12 mm. Metastatic disease cannot  be excluded. Mildly enlarged adenopathy is  also noted in the gastrohepatic ligament, but this is unchanged compared to prior exam.   Stable bibasilar lung opacities are noted concerning for inflammation, atelectasis or possibly scarring.   Grossly stable multi-septated cystic lesion is seen involving upper pole of right kidney with peripheral calcifications compared to prior exam of 2019, most likely representing benign etiology.   Aortic Atherosclerosis    08/18/2022 - 08/21/2022 Hospital Admission   Admitted due to multifocal pneumonia, treated with Influenza panel negative, COVID-negative, HIV negative, Complete course of antibiotics with Zithromax, Vantin     08/19/2022 Imaging   MR abdomen w wo contrast  Marked caudate lobe hypertrophy, highly suspicious for hepatic cirrhosis.   Numerous hypervascular masses throughout the right hepatic lobe, highly suspicious for multifocal hepatocellular carcinoma. Recommend correlation with AFP and consider tissue sampling.   Mild abdominal lymphadenopathy, with differential diagnosis including metastatic disease and reactive lymphadenopathy in setting of cirrhosis.   Bilateral lower lobe infiltrates, as better demonstrated on recent CT.    08/19/2022 Tumor Marker   AFP 4.1   08/27/2022 Initial Diagnosis   Cholangiocarcinoma  -08/20/22 Liver mass biopsy showed poorly differentiated carcinoma Immunohistochemical stains show that the poorly differentiated carcinoma is positive for CK7 and MOC-31, suggestive of a poorly differentiated adenocarcinoma.  Immunohistochemical stains for CK20, CDX2, HepPar 1 and arginase are negative.  Immunostain for glypican-3 shows very focal  likely nonspecific labeling.  This immunoprofile is nonspecific but can be compatible with a poorly differentiated primary cholangiocarcinoma in absence of any other lesions.   NGS showed BAP 1 mutation.  Case was presented at tumor board, I have also discussed with pathologist Dr.Rubinas,  IHC pattern is more  consistent with adenocarcinoma, unlikely [place] [due to negative HepPar 1 and arginase], cholangiocarcinoma is favored, although this is a diagnosis of exclusion. Second opinion ar [facility]   08/27/2022 Cancer Staging   Staging form: Intrahepatic Bile Duct, AJCC 8th Edition - Clinical stage from 08/27/2022: Stage IV (cT2, cN1, cM1) - Signed by Earlie Server, MD on 09/13/2022 Stage prefix: Initial diagnosis   08/27/2022 Tumor Marker   CA 19.9  16 CEA 2.8   09/12/2022 Imaging   PET scan showed 1. Large right hepatic lobe mass is hypermetabolic and consistent with known cholangiocarcinoma. 2. Scattered borderline enlarged upper abdominal lymph nodes do not show any significant FDG uptake. 3. 4 hypermetabolic bone lesions consistent with metastatic disease. 4. Progressive diffuse airspace process in the left lung could be due to atypical pneumonia, pulmonary hemorrhage or left-sided aspiration. 5. Aortic atherosclerosis.  09/12/2022 I had a phone discussion with patient after his PET scan resulted.  Recommend systemic chemotherapy.  Patient would like to defer until his second opinion visit at [facility].   09/15/2022 Imaging   CT angio chest pulmonary embolism protocol showed 1. No evidence for pulmonary embolism. 2. Progression of bilateral multifocal patchy ground-glass and interstitial opacities, left greater than right. Findings are concerning for multifocal pneumonia. 3. Air-fluid levels in the left lower lobe bronchi worrisome for aspiration. 4. Mediastinal and hilar lymphadenopathy has increased in size and number when compared to the prior study, likely reactive.5. Cardiomegaly. 6. Findings compatible with pulmonary artery hypertension.7. Stable right liver mass and complex right renal cystic lesion. 8. Stable enlargement of the left thyroid.   09/15/2022 - 09/17/2022 Hospital Admission   Patient was admitted due to shortness of breath, CT showed progression of bilateral multifocal patchy groundglass  and interstitial opacities.  Left greater than right.  Concerning for multifocal pneumonia.  Patient was started on broad-spectrum antibiotics with significant improvement.  He was discharged on oral antibiotics.   09/19/2022 Miscellaneous   Patient [facility] to [facility] for [facility] opinion.  He was seen by Stanton Kidney, who felt that the presentation is consistent with metastatic cholangiocarcinoma, and agrees with the plan of cisplatin/gemcitabine/durvalumab..    10/11/2022 -  Chemotherapy   Patient is on Treatment Plan : Cisplatin D1,8 + Gemcitabine + Durvalumab  D1,8 q21d x     10/12/2022 Imaging   CT chest w contrast 1. Bilateral airspace and ground-glass opacities have mildly decreased in amount and density when compared to the prior study. Findings are compatible with resolving multifocal pneumonia. Continued follow-up imaging recommended. 2. Stable enlarged heterogeneous left thyroid gland with 3.2 cm nodule. This can be further evaluated with dedicated ultrasound. 3. Stable hepatic and right renal lesions, incompletely evaluated.   Aortic Atherosclerosis (ICD10-I70.0).   12/11/2022 Imaging   PET showed  1. There is been mild increase in size of the tracer avid mass within right lobe of liver compatible with known cholangiocarcinoma. The degree of tracer uptake however is slightly decreased in the interval. 2. Multifocal tracer avid bone metastases are again noted. When compared with the previous exam there has been an mixed interval response to therapy. Specifically, there has been interval increase in size and FDG uptake associated with the right ischial tuberosity lesion and there is a new lesion within the posterior left iliac bone. Decreased tracer uptake is associated with the left scapular lesion and there is stable tracer uptake associated with the right T1 transverse process lesion. 3. Persistent ground-glass and airspace densities within the left upper lobe and left lower lobe  compatible with inflammatory or infectious process. Interval resolution of previous right lung ground-glass and airspace densities.    12/24/2022 Procedure   Medi port placement by Dr. Lucky Cowboy   01/22/2023 Imaging   CT chest abdomen pelvis with contrast 1. Hypodense lesion of the right lobe of the liver, hepatic segment VII/VIII is slightly diminished in size, consistent with treatment response. 2. No evident change in additional smaller hypodense lesions scattered throughout the right lobe of the liver, these more difficult to see on prior noncontrast PET-CT. 3. Unchanged enlarged celiac axis, gastrohepatic ligament, and retroperitoneal lymph nodes. Unchanged prominent high right paratracheal lymph nodes. 4. Unchanged osseous metastases. 5. Slightly improved, although still extensive ground-glass airspace opacity primarily seen throughout the left lower lobe and lingula,nonspecific and infectious or inflammatory. 6. Coronary artery disease. 7. Emphysema.     He tolerated chemotherapy. + fatigue after treatment. Left hand swelling due to extravasation incident 2 weeks ago.  No nausea vomiting diarrhea + No fever, chills.  He denies any pain Appetite is not good. Weight is stable.    Review of Systems  Constitutional:  Positive for fatigue. Negative for appetite change, chills and fever.  HENT:   Negative for hearing loss and voice change.   Eyes:  Negative for eye problems.  Respiratory:  Positive for shortness of breath. Negative for chest tightness and cough.   Cardiovascular:  Negative for chest pain.  Gastrointestinal:  Negative for abdominal distention, abdominal pain and blood in stool.  Endocrine: Negative for hot flashes.  Genitourinary:  Negative for difficulty urinating and frequency.   Musculoskeletal:  Negative for arthralgias.  Skin:  Negative for itching and rash.  Neurological:  Negative for extremity weakness.  Hematological:  Negative for adenopathy. Bruises/bleeds  easily.  Psychiatric/Behavioral:  Negative for  confusion.     MEDICAL HISTORY:  Past Medical History:  Diagnosis Date   Antiphospholipid antibody syndrome ([place])    Arthritis    Cancer ([place])    Diabetes mellitus without complication (HCC)    DVT (deep venous thrombosis) (HCC)    Lt leg   Dyspnea    Elevated lipids    Erythrocytosis    Hyperlipidemia    Hypertension    Lower extremity edema    Multinodular thyroid    Post-thrombotic syndrome     SURGICAL HISTORY: Past Surgical History:  Procedure Laterality Date   COLONOSCOPY WITH PROPOFOL N/A 09/23/2018   Per Dr. Alice Reichert, polyp, repeat in 3 yrs    EYE MUSCLE SURGERY     JOINT REPLACEMENT Left 07/22/2017   PORTA CATH INSERTION N/A 12/24/2022   Procedure: PORTA CATH INSERTION;  Surgeon: Algernon Huxley, MD;  Location: [facility];  Service: Cardiovascular;  Laterality: N/A;   TOTAL HIP ARTHROPLASTY Left 07/22/2017   Procedure: TOTAL HIP ARTHROPLASTY;  Surgeon: Dereck Leep, MD;  Location: [facility];  Service: Orthopedics;  Laterality: Left;    SOCIAL HISTORY: Social History   Socioeconomic History   Marital status: Married    Spouse name: Not on file   Number of children: Not on file   Years of education: Not on file   Highest education level: Not on file  Occupational History   Not on file  Tobacco Use   Smoking status: Former    Packs/day: 0.50    Years: 6.00    Additional pack years: 0.00    Total pack years: 3.00    Types: Cigarettes    Quit date: 44    Years since quitting: 42.2   Smokeless tobacco: Never   Tobacco comments:    Pt states when he did smoke he smoked at most 1 ppd. ALS 09/26/2022  Vaping Use   Vaping Use: Never used  Substance and Sexual Activity   Alcohol use: Yes    Comment: occ   Drug use: No   Sexual activity: Not on file  Other Topics Concern   Not on file  Social History Narrative   Not on file   Social Determinants of Health   Financial Resource Strain: Low Risk   (08/27/2022)   Overall Financial Resource Strain (CARDIA)    Difficulty of Paying Living Expenses: Not very hard  Food Insecurity: No Food Insecurity (09/15/2022)   Hunger Vital Sign    Worried About Running Out of Food in the Last Year: Never true    Ran Out of Food in the Last Year: Never true  Transportation Needs: No Transportation Needs (09/15/2022)   PRAPARE - Hydrologist (Medical): No    Lack of Transportation (Non-Medical): No  Physical Activity: Insufficiently Active (08/27/2022)   Exercise Vital Sign    Days of Exercise per Week: 3 days    Minutes of Exercise per Session: 30 min  Stress: Stress Concern Present (08/27/2022)   [place]    Feeling of Stress : Rather much  Social Connections: Socially Integrated (08/27/2022)   Social Connection and Isolation Panel [NHANES]    Frequency of Communication with Friends and Family: More than three times a week    Frequency of Social Gatherings with Friends and Family: More than three times a week    Attends Religious Services: 1 to 4 times per year    Active Member of  Clubs or Organizations: Yes    Attends Archivist Meetings: Never    Marital Status: Married  Human resources officer Violence: Not At Risk (09/15/2022)   Humiliation, Afraid, Rape, and Kick questionnaire    Fear of Current or Ex-Partner: No    Emotionally Abused: No    Physically Abused: No    Sexually Abused: No    FAMILY HISTORY: Family History  Problem Relation Age of Onset   Breast cancer Mother    Emphysema Father    Colon cancer Maternal Grandmother     ALLERGIES:  has No Known Allergies.  MEDICATIONS:  Current Outpatient Medications  Medication Sig Dispense Refill   albuterol (VENTOLIN HFA) 108 (90 Base) MCG/ACT inhaler TAKE 2 PUFFS BY MOUTH EVERY 4 HOURS AS NEEDED 8.5 each 1   amLODipine (NORVASC) 10 MG tablet Take 1 tablet (10 mg total) by mouth  daily. 30 tablet 0   Calcium Carb-Cholecalciferol (CALCIUM 500 + D PO) Take 1 tablet by mouth daily.     dexamethasone (DECADRON) 4 MG tablet TAKE 2 TABLETS DAILY X 3 DAYS STARTING THE DAY AFTER CISPLATIN CHEMOTHERAPY. TAKE WITH FOOD. 30 tablet 1   enoxaparin (LOVENOX) 120 MG/0.8ML injection INJECT 0.8 MLS (120 MG TOTAL) INTO THE SKIN EVERY 12 (TWELVE) HOURS FOR 28 DAYS. 44.8 mL 2   glipiZIDE (GLUCOTROL) 10 MG tablet Take 1 tablet (10 mg total) by mouth 2 (two) times daily before a meal. (Patient taking differently: Take 10 mg by mouth daily before breakfast.) 180 tablet 3   glucose blood test strip 1 each by Other route as needed for other. accu chek aviva plusUse as instructed     lidocaine-prilocaine (EMLA) cream Apply 1 Application topically as needed. 30 g 5   LORazepam (ATIVAN) 0.5 MG tablet TAKE 1 TABLET (0.5 MG TOTAL) BY MOUTH EVERY 8 (EIGHT) HOURS AS NEEDED FOR ANXIETY (NAUSEA VOMITING). 60 tablet 0   magnesium chloride (SLOW-MAG) 64 MG TBEC SR tablet Take 1 tablet (64 mg total) by mouth daily. 30 tablet 3   metFORMIN (GLUCOPHAGE) 500 MG tablet TAKE TWO TABLETS BY MOUTH EVERY MORNING WITH BREAKFAST 180 tablet 1   ondansetron (ZOFRAN) 8 MG tablet Take 1 tablet (8 mg total) by mouth every 8 (eight) hours as needed for nausea or vomiting. Start on the third day after cisplatin. 90 tablet 1   pravastatin (PRAVACHOL) 20 MG tablet Take 1 tablet (20 mg total) by mouth daily. 90 tablet 3   prochlorperazine (COMPAZINE) 10 MG tablet TAKE 1 TABLET (10 MG TOTAL) BY MOUTH EVERY 6 HOURS AS NEEDED FOR NAUSEA AND VOMITING 90 tablet 1   tadalafil (CIALIS) 20 MG tablet Take 0.5-1 tablets (10-20 mg total) by mouth every other day as needed for erectile dysfunction. 20 tablet 11   triamcinolone cream (KENALOG) 0.1 % Apply 1 Application topically 3 (three) times daily. (Patient taking differently: Apply 1 Application topically 3 (three) times daily as needed (for bilateral, below-the-knees skin irritation).) 453.6  g 1   benzonatate (TESSALON) 200 MG capsule Take 1 capsule (200 mg total) by mouth 3 (three) times daily as needed for cough. (Patient not taking: Reported on 12/17/2022) 30 capsule 3   MUCINEX D MAX STRENGTH [id number] MG TB12 Take 1,200 mg by mouth 2 (two) times daily as needed (for congestion or to loosen mucous in the chest). (Patient not taking: Reported on 01/23/2023)     No current facility-administered medications for this visit.     PHYSICAL EXAMINATION: ECOG PERFORMANCE STATUS: 1 -  Symptomatic but completely ambulatory Vitals:   01/23/23 0925  BP: (!) 142/76  Pulse: 76  Resp: 18  Temp: 98.1 F (36.7 C)   Filed Weights   01/23/23 0925  Weight: 297 lb 9.6 oz (135 kg)    Physical Exam Constitutional:      General: He is not in acute distress. HENT:     Head: Normocephalic and atraumatic.  Eyes:     General: No scleral icterus. Cardiovascular:     Rate and Rhythm: Normal rate.  Pulmonary:     Effort: Pulmonary effort is normal. No respiratory distress.     Breath sounds: No wheezing.  Abdominal:     General: Bowel sounds are normal. There is no distension.  Musculoskeletal:        General: Normal range of motion.     Cervical back: Normal range of motion and neck supple.     Right lower leg: No edema.     Comments: Bilateral chronic lower extremity swelling, varicose veins  Skin:    General: Skin is warm and dry.     Findings: No erythema or rash.  Neurological:     Mental Status: He is alert and oriented to person, place, and time. Mental status is at baseline.     Cranial Nerves: No cranial nerve deficit.  Psychiatric:        Mood and Affect: Mood normal.     LABORATORY DATA:  I have reviewed the data as listed    Latest Ref Rng & Units 01/23/2023    9:02 AM 01/10/2023    8:20 AM 01/02/2023    9:04 AM  CBC  WBC 4.0 - 10.5 K/uL 2.7  2.7  4.0   Hemoglobin 13.0 - 17.0 g/dL 8.9  10.4  10.4   Hematocrit 39.0 - 52.0 % 28.0  31.1  32.4   Platelets 150 - 400  K/uL 144  250  149       Latest Ref Rng & Units 01/23/2023    9:02 AM 01/10/2023    8:20 AM 01/02/2023    9:04 AM  CMP  Glucose 70 - 99 mg/dL 152  141  124   BUN 8 - 23 mg/dL 6  7  6    Creatinine 0.61 - 1.24 mg/dL 0.82  0.73  0.71   Sodium 135 - 145 mmol/L 135  133  138   Potassium 3.5 - 5.1 mmol/L 3.7  3.5  3.6   Chloride 98 - 111 mmol/L 103  100  103   CO2 22 - 32 mmol/L 25  24  25    Calcium 8.9 - 10.3 mg/dL 8.5  8.4  9.0   Total Protein 6.5 - 8.1 g/dL 5.9  6.1  6.2   Total Bilirubin 0.3 - 1.2 mg/dL 0.5  0.6  0.5   Alkaline Phos 38 - 126 U/L 55  60  55   AST 15 - 41 U/L 23  26  24    ALT 0 - 44 U/L 19  31  20      RADIOGRAPHIC STUDIES: I have personally reviewed the radiological images as listed and agreed with the findings in the report. CT CHEST ABDOMEN PELVIS W CONTRAST  Result Date: 01/23/2023 CLINICAL DATA:  Cholangiocarcinoma * Tracking Code: BO * EXAM: CT CHEST, ABDOMEN, AND PELVIS WITH CONTRAST TECHNIQUE: Multidetector CT imaging of the chest, abdomen and pelvis was performed following the standard protocol during bolus administration of intravenous contrast. RADIATION DOSE REDUCTION: This exam was performed according to  the departmental dose-optimization program which includes automated exposure control, adjustment of the mA and/or kV according to patient size and/or use of iterative reconstruction technique. CONTRAST:  174mL OMNIPAQUE IOHEXOL 300 MG/ML  SOLN COMPARISON:  PET-CT, 12/10/2022 FINDINGS: CT CHEST FINDINGS Cardiovascular: Right chest port catheter. Normal heart size. Scattered left and right coronary artery calcifications. No pericardial effusion. Mediastinum/Nodes: Unchanged prominent high right paratracheal lymph nodes (series 2, image 97). Unchanged heterogeneous left lobe thyroid goiter, no further follow-up or characterization is required in the setting of known metastatic primary malignancy (series 2, image 7). In the setting of significant comorbidities or limited  life expectancy, no follow-up recommended (ref: J Am Coll Radiol. 2015 Feb;12(2): 143-50). Trachea, and esophagus demonstrate no significant findings. Lungs/Pleura: Slightly improved, although still extensive ground-glass airspace opacity primarily seen throughout the left lower lobe and lingula (series 3, image 115). Minimal paraseptal emphysema. No pleural effusion or pneumothorax. Musculoskeletal: No chest wall abnormality. No acute osseous findings. CT ABDOMEN PELVIS FINDINGS Hepatobiliary: Hypodense lesion of the right lobe of the liver, hepatic segment VII/VIII is slightly diminished in size measuring 6.8 x 6.3 cm, previously 8.3 x 6.1 cm when measured similarly (series 2, image 67). No evident change in additional smaller hypodense lesions scattered throughout the right lobe of the liver, these more difficult to see on prior noncontrast PET-CT, for example in the superior liver dome, hepatic segment VIII measuring 1.6 x 1.5 cm (series 2, image 62). No gallstones, gallbladder wall thickening, or biliary dilatation. Pancreas: Unremarkable. No pancreatic ductal dilatation or surrounding inflammatory changes. Spleen: Normal in size without significant abnormality. Adrenals/Urinary Tract: Adrenal glands are unremarkable. Unchanged benign, thinly septated and rim calcified cyst of the superior pole of the right kidney, as well as additional simple bilateral renal cortical cysts, for which no further follow-up or characterization is required. Kidneys are otherwise normal, without renal calculi, solid lesion, or hydronephrosis. Bladder is unremarkable. Stomach/Bowel: Stomach is within normal limits. Appendix appears normal. No evidence of bowel wall thickening, distention, or inflammatory changes. Vascular/Lymphatic: Aortic atherosclerosis. Similar enlarged celiac axis, gastrohepatic ligament, and retroperitoneal lymph nodes, largest gastrohepatic ligament node measuring 2.6 x 1.5 cm (series 2, image 67).  Reproductive: Prostatomegaly. Other: No abdominal wall hernia or abnormality. No ascites. Musculoskeletal: No acute osseous findings. Status post left hip total arthroplasty. Similar appearance of a lytic metastasis of the right ischial tuberosity (series 2, image 141). Similar sclerotic metastasis of the posterior left ilium (series 2, image 122). Similar mixed lytic and sclerotic metastasis of the left scapular body (series 2, image 9). Similar lytic metastasis of the right T1 transverse process (series 2, image 4). IMPRESSION: 1. Hypodense lesion of the right lobe of the liver, hepatic segment VII/VIII is slightly diminished in size, consistent with treatment response. 2. No evident change in additional smaller hypodense lesions scattered throughout the right lobe of the liver, these more difficult to see on prior noncontrast PET-CT. 3. Unchanged enlarged celiac axis, gastrohepatic ligament, and retroperitoneal lymph nodes. Unchanged prominent high right paratracheal lymph nodes. 4. Unchanged osseous metastases. 5. Slightly improved, although still extensive ground-glass airspace opacity primarily seen throughout the left lower lobe and lingula, nonspecific and infectious or inflammatory. 6. Coronary artery disease. 7. Emphysema. Aortic Atherosclerosis (ICD10-I70.0) and Emphysema (ICD10-J43.9). Electronically Signed   By: Delanna Ahmadi M.D.   On: 01/23/2023 08:56

## 2023-01-23 NOTE — Assessment & Plan Note (Signed)
He follows up with pulmonology.  repeat CT images were reviewed and discussed with patient. I also discussed with pulmonology Dr. Silas Flood who recommends surgical biopsy. DDX includes organizing pneumonia, ILD, aspiration, lymphatic spread, etc. He declined repeat bronchoscopy/biopsy.  Stable findings on most recent CT scan.  Continue to monitor.

## 2023-01-24 ENCOUNTER — Inpatient Hospital Stay: Payer: BC Managed Care – PPO

## 2023-01-24 VITALS — BP 139/68 | HR 82 | Temp 97.9°F | Resp 16

## 2023-01-24 DIAGNOSIS — D6481 Anemia due to antineoplastic chemotherapy: Secondary | ICD-10-CM | POA: Diagnosis not present

## 2023-01-24 DIAGNOSIS — Z86718 Personal history of other venous thrombosis and embolism: Secondary | ICD-10-CM | POA: Diagnosis not present

## 2023-01-24 DIAGNOSIS — Z5112 Encounter for antineoplastic immunotherapy: Secondary | ICD-10-CM | POA: Diagnosis not present

## 2023-01-24 DIAGNOSIS — C7951 Secondary malignant neoplasm of bone: Secondary | ICD-10-CM | POA: Diagnosis not present

## 2023-01-24 DIAGNOSIS — Z452 Encounter for adjustment and management of vascular access device: Secondary | ICD-10-CM | POA: Diagnosis not present

## 2023-01-24 DIAGNOSIS — Z510 Encounter for antineoplastic radiation therapy: Secondary | ICD-10-CM | POA: Diagnosis not present

## 2023-01-24 DIAGNOSIS — Z5111 Encounter for antineoplastic chemotherapy: Secondary | ICD-10-CM | POA: Diagnosis not present

## 2023-01-24 DIAGNOSIS — Z7901 Long term (current) use of anticoagulants: Secondary | ICD-10-CM | POA: Diagnosis not present

## 2023-01-24 DIAGNOSIS — C221 Intrahepatic bile duct carcinoma: Secondary | ICD-10-CM

## 2023-01-24 DIAGNOSIS — D6861 Antiphospholipid syndrome: Secondary | ICD-10-CM | POA: Diagnosis not present

## 2023-01-24 MED ORDER — SODIUM CHLORIDE 0.9 % IV SOLN
1000.0000 mg/m2 | Freq: Once | INTRAVENOUS | Status: AC
  Administered 2023-01-24: 2660 mg via INTRAVENOUS
  Filled 2023-01-24: qty 52.57

## 2023-01-24 MED ORDER — SODIUM CHLORIDE 0.9 % IV SOLN
25.0000 mg/m2 | Freq: Once | INTRAVENOUS | Status: AC
  Administered 2023-01-24: 66 mg via INTRAVENOUS
  Filled 2023-01-24: qty 66

## 2023-01-24 MED ORDER — POTASSIUM CHLORIDE IN NACL 20-0.9 MEQ/L-% IV SOLN
Freq: Once | INTRAVENOUS | Status: AC
  Filled 2023-01-24: qty 1000

## 2023-01-24 MED ORDER — HEPARIN SOD (PORK) LOCK FLUSH 100 UNIT/ML IV SOLN
500.0000 [IU] | Freq: Once | INTRAVENOUS | Status: DC | PRN
  Filled 2023-01-24: qty 5

## 2023-01-24 MED ORDER — SODIUM CHLORIDE 0.9 % IV SOLN
10.0000 mg | Freq: Once | INTRAVENOUS | Status: AC
  Administered 2023-01-24: 10 mg via INTRAVENOUS
  Filled 2023-01-24: qty 10

## 2023-01-24 MED ORDER — MAGNESIUM SULFATE 2 GM/50ML IV SOLN
2.0000 g | Freq: Once | INTRAVENOUS | Status: AC
  Administered 2023-01-24: 2 g via INTRAVENOUS
  Filled 2023-01-24: qty 50

## 2023-01-24 MED ORDER — SODIUM CHLORIDE 0.9 % IV SOLN
150.0000 mg | Freq: Once | INTRAVENOUS | Status: AC
  Administered 2023-01-24: 150 mg via INTRAVENOUS
  Filled 2023-01-24: qty 5

## 2023-01-24 MED ORDER — SODIUM CHLORIDE 0.9 % IV SOLN
Freq: Once | INTRAVENOUS | Status: AC
  Filled 2023-01-24: qty 250

## 2023-01-24 MED ORDER — SODIUM CHLORIDE 0.9 % IV SOLN
10.0000 mg/kg | Freq: Once | INTRAVENOUS | Status: AC
  Administered 2023-01-24: 1360 mg via INTRAVENOUS
  Filled 2023-01-24: qty 20

## 2023-01-24 MED ORDER — PALONOSETRON HCL INJECTION 0.25 MG/5ML
0.2500 mg | Freq: Once | INTRAVENOUS | Status: AC
  Administered 2023-01-24: 0.25 mg via INTRAVENOUS
  Filled 2023-01-24: qty 5

## 2023-01-24 NOTE — Patient Instructions (Signed)
[facility]  Discharge Instructions: Thank you for choosing [facility] to provide your oncology and hematology care.  If you have a lab appointment with the Cancer Center, please go directly to the Cancer Center and check in at the registration area.  Wear comfortable clothing and clothing appropriate for easy access to any Portacath or PICC line.   We strive to give you quality time with your provider. You may need to reschedule your appointment if you arrive late (15 or more minutes).  Arriving late affects you and other patients whose appointments are after yours.  Also, if you miss three or more appointments without notifying the office, you may be dismissed from the clinic at the provider's discretion.      For prescription refill requests, have your pharmacy contact our office and allow 72 hours for refills to be completed.    Today you received the following chemotherapy and/or immunotherapy agents Imfinzi, Gemzar, Cisplatin      To help prevent nausea and vomiting after your treatment, we encourage you to take your nausea medication as directed.  BELOW ARE SYMPTOMS THAT SHOULD BE REPORTED IMMEDIATELY: *FEVER GREATER THAN 100.4 F (38 C) OR HIGHER *CHILLS OR SWEATING *NAUSEA AND VOMITING THAT IS NOT CONTROLLED WITH YOUR NAUSEA MEDICATION *UNUSUAL SHORTNESS OF BREATH *UNUSUAL BRUISING OR BLEEDING *URINARY PROBLEMS (pain or burning when urinating, or frequent urination) *BOWEL PROBLEMS (unusual diarrhea, constipation, pain near the anus) TENDERNESS IN MOUTH AND THROAT WITH OR WITHOUT PRESENCE OF ULCERS (sore throat, sores in mouth, or a toothache) UNUSUAL RASH, SWELLING OR PAIN  UNUSUAL VAGINAL DISCHARGE OR ITCHING   Items with * indicate a potential emergency and should be followed up as soon as possible or go to the Emergency Department if any problems should occur.  Please show the CHEMOTHERAPY ALERT CARD or IMMUNOTHERAPY ALERT  CARD at check-in to the Emergency Department and triage nurse.  Should you have questions after your visit or need to cancel or reschedule your appointment, please contact [facility]  [phone] and follow the prompts.  Office hours are 8:00 a.m. to 4:30 p.m. Monday - Friday. Please note that voicemails left after 4:00 p.m. may not be returned until the following business day.  We are closed weekends and major holidays. You have access to a nurse at all times for urgent questions. Please call the main number to the clinic [phone] and follow the prompts.  For any non-urgent questions, you may also contact your provider using MyChart. We now offer e-Visits for anyone 18 and older to request care online for non-urgent symptoms. For details visit [email or web address].   Also download the MyChart app! Go to the app store, search "MyChart", open the app, select , and log in with your MyChart username and password.    

## 2023-01-24 NOTE — Progress Notes (Signed)
Per Dr Tasia Catchings, ok to run cisplatin concurrently with post hydration fluids.

## 2023-01-25 LAB — CEA: CEA: 15.8 ng/mL — ABNORMAL HIGH (ref 0.0–4.7)

## 2023-01-25 LAB — CANCER ANTIGEN 19-9: CA 19-9: 141 U/mL — ABNORMAL HIGH (ref 0–35)

## 2023-01-27 ENCOUNTER — Other Ambulatory Visit: Payer: Self-pay | Admitting: Oncology

## 2023-01-29 ENCOUNTER — Encounter: Payer: Self-pay | Admitting: Oncology

## 2023-01-29 ENCOUNTER — Other Ambulatory Visit: Payer: Self-pay

## 2023-01-30 ENCOUNTER — Other Ambulatory Visit: Payer: BC Managed Care – PPO

## 2023-01-30 ENCOUNTER — Inpatient Hospital Stay: Payer: BC Managed Care – PPO

## 2023-01-30 DIAGNOSIS — R978 Other abnormal tumor markers: Secondary | ICD-10-CM | POA: Insufficient documentation

## 2023-01-30 DIAGNOSIS — Z86718 Personal history of other venous thrombosis and embolism: Secondary | ICD-10-CM | POA: Insufficient documentation

## 2023-01-30 DIAGNOSIS — D6861 Antiphospholipid syndrome: Secondary | ICD-10-CM | POA: Insufficient documentation

## 2023-01-30 DIAGNOSIS — Z5111 Encounter for antineoplastic chemotherapy: Secondary | ICD-10-CM | POA: Insufficient documentation

## 2023-01-30 DIAGNOSIS — C7951 Secondary malignant neoplasm of bone: Secondary | ICD-10-CM | POA: Insufficient documentation

## 2023-01-30 DIAGNOSIS — R0602 Shortness of breath: Secondary | ICD-10-CM | POA: Diagnosis not present

## 2023-01-30 DIAGNOSIS — Z95828 Presence of other vascular implants and grafts: Secondary | ICD-10-CM

## 2023-01-30 DIAGNOSIS — D6481 Anemia due to antineoplastic chemotherapy: Secondary | ICD-10-CM | POA: Insufficient documentation

## 2023-01-30 DIAGNOSIS — Z7901 Long term (current) use of anticoagulants: Secondary | ICD-10-CM | POA: Insufficient documentation

## 2023-01-30 DIAGNOSIS — Z510 Encounter for antineoplastic radiation therapy: Secondary | ICD-10-CM | POA: Insufficient documentation

## 2023-01-30 DIAGNOSIS — Z452 Encounter for adjustment and management of vascular access device: Secondary | ICD-10-CM | POA: Insufficient documentation

## 2023-01-30 DIAGNOSIS — I4891 Unspecified atrial fibrillation: Secondary | ICD-10-CM | POA: Insufficient documentation

## 2023-01-30 DIAGNOSIS — C221 Intrahepatic bile duct carcinoma: Secondary | ICD-10-CM | POA: Insufficient documentation

## 2023-01-30 DIAGNOSIS — R600 Localized edema: Secondary | ICD-10-CM | POA: Insufficient documentation

## 2023-01-30 DIAGNOSIS — R000 Tachycardia, unspecified: Secondary | ICD-10-CM | POA: Insufficient documentation

## 2023-01-30 LAB — CBC WITH DIFFERENTIAL/PLATELET
Abs Immature Granulocytes: 0.06 10*3/uL (ref 0.00–0.07)
Basophils Absolute: 0 10*3/uL (ref 0.0–0.1)
Basophils Relative: 2 %
Eosinophils Absolute: 0 10*3/uL (ref 0.0–0.5)
Eosinophils Relative: 1 %
HCT: 28.5 % — ABNORMAL LOW (ref 39.0–52.0)
Hemoglobin: 9.2 g/dL — ABNORMAL LOW (ref 13.0–17.0)
Immature Granulocytes: 3 %
Lymphocytes Relative: 22 %
Lymphs Abs: 0.4 10*3/uL — ABNORMAL LOW (ref 0.7–4.0)
MCH: 32.4 pg (ref 26.0–34.0)
MCHC: 32.3 g/dL (ref 30.0–36.0)
MCV: 100.4 fL — ABNORMAL HIGH (ref 80.0–100.0)
Monocytes Absolute: 0.2 10*3/uL (ref 0.1–1.0)
Monocytes Relative: 12 %
Neutro Abs: 1.1 10*3/uL — ABNORMAL LOW (ref 1.7–7.7)
Neutrophils Relative %: 60 %
Platelets: 218 10*3/uL (ref 150–400)
RBC: 2.84 MIL/uL — ABNORMAL LOW (ref 4.22–5.81)
RDW: 15.5 % (ref 11.5–15.5)
WBC: 1.8 10*3/uL — ABNORMAL LOW (ref 4.0–10.5)
nRBC: 4 % — ABNORMAL HIGH (ref 0.0–0.2)

## 2023-01-30 LAB — COMPREHENSIVE METABOLIC PANEL
ALT: 26 U/L (ref 0–44)
AST: 22 U/L (ref 15–41)
Albumin: 3.3 g/dL — ABNORMAL LOW (ref 3.5–5.0)
Alkaline Phosphatase: 50 U/L (ref 38–126)
Anion gap: 8 (ref 5–15)
BUN: 11 mg/dL (ref 8–23)
CO2: 24 mmol/L (ref 22–32)
Calcium: 8.1 mg/dL — ABNORMAL LOW (ref 8.9–10.3)
Chloride: 102 mmol/L (ref 98–111)
Creatinine, Ser: 0.71 mg/dL (ref 0.61–1.24)
GFR, Estimated: >60 mL/min (ref >60)
Glucose, Bld: 131 mg/dL — ABNORMAL HIGH (ref 70–99)
Potassium: 3.8 mmol/L (ref 3.5–5.1)
Sodium: 134 mmol/L — ABNORMAL LOW (ref 135–145)
Total Bilirubin: 0.3 mg/dL (ref 0.3–1.2)
Total Protein: 5.8 g/dL — ABNORMAL LOW (ref 6.5–8.1)

## 2023-01-30 LAB — MAGNESIUM: Magnesium: 1.9 mg/dL (ref 1.7–2.4)

## 2023-01-30 MED ORDER — SODIUM CHLORIDE 0.9% FLUSH
10.0000 mL | INTRAVENOUS | Status: DC | PRN
  Administered 2023-01-30: 10 mL via INTRAVENOUS
  Filled 2023-01-30: qty 10

## 2023-01-30 MED ORDER — HEPARIN SOD (PORK) LOCK FLUSH 100 UNIT/ML IV SOLN
500.0000 [IU] | Freq: Once | INTRAVENOUS | Status: AC
  Administered 2023-01-30: 500 [IU] via INTRAVENOUS
  Filled 2023-01-30: qty 5

## 2023-01-30 MED FILL — Fosaprepitant Dimeglumine For IV Infusion 150 MG (Base Eq): INTRAVENOUS | Qty: 5 | Status: AC

## 2023-01-30 MED FILL — Dexamethasone Sodium Phosphate Inj 100 MG/10ML: INTRAMUSCULAR | Qty: 1 | Status: AC

## 2023-01-30 NOTE — Patient Instructions (Signed)

## 2023-01-31 ENCOUNTER — Other Ambulatory Visit: Payer: BC Managed Care – PPO

## 2023-01-31 ENCOUNTER — Inpatient Hospital Stay: Payer: BC Managed Care – PPO

## 2023-01-31 VITALS — BP 154/94 | HR 94 | Temp 97.9°F | Resp 18

## 2023-01-31 DIAGNOSIS — C221 Intrahepatic bile duct carcinoma: Secondary | ICD-10-CM | POA: Diagnosis not present

## 2023-01-31 DIAGNOSIS — C7951 Secondary malignant neoplasm of bone: Secondary | ICD-10-CM | POA: Diagnosis not present

## 2023-01-31 DIAGNOSIS — R600 Localized edema: Secondary | ICD-10-CM | POA: Diagnosis not present

## 2023-01-31 DIAGNOSIS — Z86718 Personal history of other venous thrombosis and embolism: Secondary | ICD-10-CM | POA: Diagnosis not present

## 2023-01-31 DIAGNOSIS — D6861 Antiphospholipid syndrome: Secondary | ICD-10-CM | POA: Diagnosis not present

## 2023-01-31 DIAGNOSIS — D6481 Anemia due to antineoplastic chemotherapy: Secondary | ICD-10-CM | POA: Diagnosis not present

## 2023-01-31 DIAGNOSIS — R978 Other abnormal tumor markers: Secondary | ICD-10-CM | POA: Diagnosis not present

## 2023-01-31 DIAGNOSIS — R0602 Shortness of breath: Secondary | ICD-10-CM | POA: Diagnosis not present

## 2023-01-31 DIAGNOSIS — Z5111 Encounter for antineoplastic chemotherapy: Secondary | ICD-10-CM | POA: Diagnosis not present

## 2023-01-31 DIAGNOSIS — Z510 Encounter for antineoplastic radiation therapy: Secondary | ICD-10-CM | POA: Diagnosis not present

## 2023-01-31 LAB — CEA: CEA: 15.4 ng/mL — ABNORMAL HIGH (ref 0.0–4.7)

## 2023-01-31 LAB — CANCER ANTIGEN 19-9: CA 19-9: 142 U/mL — ABNORMAL HIGH (ref 0–35)

## 2023-01-31 MED ORDER — SODIUM CHLORIDE 0.9 % IV SOLN
10.0000 mg | Freq: Once | INTRAVENOUS | Status: AC
  Administered 2023-01-31: 10 mg via INTRAVENOUS
  Filled 2023-01-31: qty 10

## 2023-01-31 MED ORDER — POTASSIUM CHLORIDE IN NACL 20-0.9 MEQ/L-% IV SOLN
Freq: Once | INTRAVENOUS | Status: AC
  Filled 2023-01-31: qty 1000

## 2023-01-31 MED ORDER — MAGNESIUM SULFATE 2 GM/50ML IV SOLN
2.0000 g | Freq: Once | INTRAVENOUS | Status: AC
  Administered 2023-01-31: 2 g via INTRAVENOUS
  Filled 2023-01-31: qty 50

## 2023-01-31 MED ORDER — SODIUM CHLORIDE 0.9% FLUSH
10.0000 mL | INTRAVENOUS | Status: DC | PRN
  Administered 2023-01-31: 10 mL
  Filled 2023-01-31: qty 10

## 2023-01-31 MED ORDER — SODIUM CHLORIDE 0.9 % IV SOLN
Freq: Once | INTRAVENOUS | Status: AC
  Filled 2023-01-31: qty 250

## 2023-01-31 MED ORDER — SODIUM CHLORIDE 0.9 % IV SOLN
1000.0000 mg/m2 | Freq: Once | INTRAVENOUS | Status: AC
  Administered 2023-01-31: 2660 mg via INTRAVENOUS
  Filled 2023-01-31: qty 69.96

## 2023-01-31 MED ORDER — PALONOSETRON HCL INJECTION 0.25 MG/5ML
0.2500 mg | Freq: Once | INTRAVENOUS | Status: AC
  Administered 2023-01-31: 0.25 mg via INTRAVENOUS
  Filled 2023-01-31: qty 5

## 2023-01-31 MED ORDER — SODIUM CHLORIDE 0.9 % IV SOLN
25.0000 mg/m2 | Freq: Once | INTRAVENOUS | Status: AC
  Administered 2023-01-31: 66 mg via INTRAVENOUS
  Filled 2023-01-31: qty 66

## 2023-01-31 MED ORDER — SODIUM CHLORIDE 0.9 % IV SOLN
150.0000 mg | Freq: Once | INTRAVENOUS | Status: AC
  Administered 2023-01-31: 150 mg via INTRAVENOUS
  Filled 2023-01-31: qty 150

## 2023-01-31 MED ORDER — HEPARIN SOD (PORK) LOCK FLUSH 100 UNIT/ML IV SOLN
500.0000 [IU] | Freq: Once | INTRAVENOUS | Status: AC | PRN
  Administered 2023-01-31: 500 [IU]
  Filled 2023-01-31: qty 5

## 2023-01-31 NOTE — Patient Instructions (Signed)
[facility]  Discharge Instructions: Thank you for choosing [facility] to provide your oncology and hematology care.  If you have a lab appointment with the [facility], please go directly to the [facility] and check in at the registration area.  Wear comfortable clothing and clothing appropriate for easy access to any Portacath or PICC line.   We strive to give you quality time with your provider. You may need to reschedule your appointment if you arrive late (15 or more minutes).  Arriving late affects you and other patients whose appointments are after yours.  Also, if you miss three or more appointments without notifying the office, you may be dismissed from the clinic at the provider's discretion.      For prescription refill requests, have your pharmacy contact our office and allow 72 hours for refills to be completed.    Today you received the following chemotherapy and/or immunotherapy agents- gemzar, cisplatin      To help prevent nausea and vomiting after your treatment, we encourage you to take your nausea medication as directed.  BELOW ARE SYMPTOMS THAT SHOULD BE REPORTED IMMEDIATELY: *FEVER GREATER THAN 100.4 F (38 C) OR HIGHER *CHILLS OR SWEATING *NAUSEA AND VOMITING THAT IS NOT CONTROLLED WITH YOUR NAUSEA MEDICATION *UNUSUAL SHORTNESS OF BREATH *UNUSUAL BRUISING OR BLEEDING *URINARY PROBLEMS (pain or burning when urinating, or frequent urination) *BOWEL PROBLEMS (unusual diarrhea, constipation, pain near the anus) TENDERNESS IN MOUTH AND THROAT WITH OR WITHOUT PRESENCE OF ULCERS (sore throat, sores in mouth, or a toothache) UNUSUAL RASH, SWELLING OR PAIN  UNUSUAL VAGINAL DISCHARGE OR ITCHING   Items with * indicate a potential emergency and should be followed up as soon as possible or go to the Emergency Department if any problems should occur.  Please show the CHEMOTHERAPY ALERT CARD or IMMUNOTHERAPY ALERT CARD at  check-in to the Emergency Department and triage nurse.  Should you have questions after your visit or need to cancel or reschedule your appointment, please contact [facility]  [phone] and follow the prompts.  Office hours are 8:00 a.m. to 4:30 p.m. Monday - Friday. Please note that voicemails left after 4:00 p.m. may not be returned until the following business day.  We are closed weekends and major holidays. You have access to a nurse at all times for urgent questions. Please call the main number to the clinic [phone] and follow the prompts.  For any non-urgent questions, you may also contact your provider using MyChart. We now offer e-Visits for anyone 24 and older to request care online for non-urgent symptoms. For details visit [email or web address].   Also download the MyChart app! Go to the app store, search "MyChart", open the app, select [facility], and log in with your MyChart username and password.

## 2023-01-31 NOTE — Progress Notes (Signed)
Per Dr. Grayland Ormond, [place] to resume post-hydration with chemotherapy.

## 2023-02-01 ENCOUNTER — Encounter: Payer: Self-pay | Admitting: Pulmonary Disease

## 2023-02-01 ENCOUNTER — Inpatient Hospital Stay: Payer: BC Managed Care – PPO

## 2023-02-01 ENCOUNTER — Telehealth (INDEPENDENT_AMBULATORY_CARE_PROVIDER_SITE_OTHER): Payer: BC Managed Care – PPO | Admitting: Acute Care

## 2023-02-01 ENCOUNTER — Ambulatory Visit
Admission: RE | Admit: 2023-02-01 | Discharge: 2023-02-01 | Disposition: A | Discharge status: Home / Self Care | Payer: BC Managed Care – PPO | Source: Ambulatory Visit | Attending: Acute Care | Admitting: Acute Care

## 2023-02-01 ENCOUNTER — Encounter: Payer: Self-pay | Admitting: Acute Care

## 2023-02-01 DIAGNOSIS — I700 Atherosclerosis of aorta: Secondary | ICD-10-CM | POA: Diagnosis not present

## 2023-02-01 DIAGNOSIS — C229 Malignant neoplasm of liver, not specified as primary or secondary: Secondary | ICD-10-CM | POA: Diagnosis not present

## 2023-02-01 DIAGNOSIS — I87009 Postthrombotic syndrome without complications of unspecified extremity: Secondary | ICD-10-CM | POA: Diagnosis not present

## 2023-02-01 DIAGNOSIS — R4689 Other symptoms and signs involving appearance and behavior: Secondary | ICD-10-CM

## 2023-02-01 DIAGNOSIS — R0602 Shortness of breath: Secondary | ICD-10-CM | POA: Diagnosis not present

## 2023-02-01 DIAGNOSIS — J189 Pneumonia, unspecified organism: Secondary | ICD-10-CM

## 2023-02-01 DIAGNOSIS — D6861 Antiphospholipid syndrome: Secondary | ICD-10-CM | POA: Diagnosis not present

## 2023-02-01 DIAGNOSIS — C221 Intrahepatic bile duct carcinoma: Secondary | ICD-10-CM | POA: Diagnosis not present

## 2023-02-01 DIAGNOSIS — I2510 Atherosclerotic heart disease of native coronary artery without angina pectoris: Secondary | ICD-10-CM | POA: Diagnosis not present

## 2023-02-01 DIAGNOSIS — J984 Other disorders of lung: Secondary | ICD-10-CM | POA: Diagnosis not present

## 2023-02-01 MED ORDER — LEVOFLOXACIN 750 MG PO TABS
750.0000 mg | ORAL_TABLET | Freq: Every day | ORAL | 0 refills | Status: DC
Start: 2023-02-01 — End: 2023-02-15

## 2023-02-01 MED ORDER — IOPAMIDOL (ISOVUE-370) INJECTION 76%
80.0000 mL | Freq: Once | INTRAVENOUS | Status: AC | PRN
  Administered 2023-02-01: 80 mL via INTRAVENOUS

## 2023-02-01 NOTE — Progress Notes (Deleted)
Virtual Visit via Telephone Note  I connected with Christopher Marsh on 02/01/23 at 11:30 AM EDT by telephone and verified that I am speaking with the correct person using two identifiers.  Location: Patient: *** Provider: ***   I discussed the limitations, risks, security and privacy concerns of performing an evaluation and management service by telephone and the availability of in person appointments. I also discussed with the patient that there may be a patient responsible charge related to this service. The patient expressed understanding and agreed to proceed.   History of Present Illness:    Observations/Objective:   Assessment and Plan:   Follow Up Instructions:    I discussed the assessment and treatment plan with the patient. The patient was provided an opportunity to ask questions and all were answered. The patient agreed with the plan and demonstrated an understanding of the instructions.   The patient was advised to call back or seek an in-person evaluation if the symptoms worsen or if the condition fails to improve as anticipated.  I provided *** minutes of non-face-to-face time during this encounter.   Bevelyn Ngo, NP

## 2023-02-01 NOTE — Progress Notes (Signed)
Virtual Visit via Video Note  I connected with Christopher Marsh on 02/01/23 at 11:30 AM EDT by a video enabled telemedicine application and verified that I am speaking with the correct person using two identifiers.  Location: Patient:  At home Provider: [id number], [place], [place], [address]    I discussed the limitations of evaluation and management by telemedicine and the availability of in person appointments. The patient expressed understanding and agreed to proceed.  History of Present Illness: Pt. Presents for an acute visit for worsening dyspnea .He was last seen in the office 10/2022 by Dr. Judeth HornHunsucker.  He states worsening dyspnea  has been ongoing since Monday 01/28/2023. He has a history of  persistent pulmonary infiltrates. He was treated in the ED 06/2022  for CAP( treated with augmentin and zithromax.) , and hospitalized 07/2022 for multifocal pneumonia . He had follow up with Dr. Judeth HornHunsucker,  who had given him a " back up" prescription for Augmentin in the event he started having symptoms in the future He started this MOnday.  .Pt is currently getting chemo , for liver cancer that was diagnosed 07/2022,  Cholangiocarcinoma (HCC) . He is complaining of a chest rattle. He is coughing up white secretions. They are thick  and white and hard to cough up. He is also taking Mucinex. He does not have a flutter valve. He does not have a fever.  He does endorse swelling in his legs.  He has Antiphospholipid syndrome and post-thrombotic syndrome and a history of DVT x 2 Feb. 2023 and in 2001. He has  recently been transitioned from Coumadin as blood thinner to Lovenox as his fluctuating diet has made it  hard to maintain therapeutic INR's. He is on Lovenox twice daily, he states that he only takes the lovenox twice daily about half the time, and he uses it once daily on the days he does not do the prescribed twice daily dosing. He states this is because he does not like giving himself shots. He  also wears compression hose.  Saturations at rest have been 94% and 97% yesterday. He desaturates with minimal exertion. He cannot tell me how low his sats are with exertion He also states he just knows what pneumonia feels like, and he is concerned this is what he may have.He may benefit from a flutter valve if this is a pneumonia.    Observations/Objective: 02/01/2023>> CTA Chest Cardiovascular: No filling defects in the pulmonary arteries to suggest pulmonary emboli. Heart is normal size. Aorta is normal caliber. Scattered coronary artery and aortic calcifications.   Mediastinum/Nodes: No mediastinal, hilar, or axillary adenopathy. Trachea and esophagus are unremarkable. Large left thyroid goiter again noted, unchanged.   Lungs/Pleura: Biapical scarring. Ground-glass airspace disease in the lingula and left lower lobe, progressed since prior study. Similar findings also now seen in the right lung base. Findings compatible with worsening pneumonia. No visible effusions or pneumothorax.   Upper Abdomen: Large right hepatic lobe mass again seen as on prior imaging compatible with patient's known cholangiocarcinoma.   Musculoskeletal: No acute findings   Review of the MIP images confirms the above findings.   IMPRESSION: Airspace disease within the lingula and left lower lobe as well as right lung base compatible with pneumonia, worsening since prior study.   No evidence of pulmonary embolus.   Right hepatic lobe mass again noted as seen on recent imaging and cancer staging.   Coronary artery disease.   Aortic Atherosclerosis (ICD10-I70.0).   Per  video, patient is in no distress. He is able to speak in full sentences while seated and resting. Skin color is good. No cyanosis per lips or hands.  Unable to assess HR, Breath sounds as remote virtual visit. He does not have AMS.  Assessment and Plan: Acute worsening of Dyspnea on exertion x 5 days No improvement with  Amoxicillin History of Persistent pulmonary infiltrates. >> suspect silent aspiration  History of DVT x 2, Antiphospholipid syndrome and post-thrombotic syndrome  Cholangiocarcinoma of the liver increasing risk for embolic disease Currently not taking Lovenox as prescribed.  Taking BID only half of the time, the other days taking once daily, so suspect sub therapeutic anticoagulation  Plan I have ordered a STAT CTA Chest to evaluate for pulmonary embolism/pneumonia. You will get a call to get this scheduled.  It is very important that you use take your lovenox twice daily as is prescribed. With your cancer diagnosis, and your history of DVT's, you are at higher risk for clot related issues.  We will call you and  let you know what the scan shows. If there is a worsening of pneumonia, I will prescribe a different antibiotic. Follow up with Dr. Judeth Horn Monday 02/04/2023 Please contact office for sooner follow up if symptoms do not improve or worsen or seek emergency care     Follow Up Instructions: Follow up with Dr. Judeth Horn Monday 02/04/2023 at 2 pm to ensure you are feeling better   I discussed the assessment and treatment plan with the patient. The patient was provided an opportunity to ask questions and all were answered. The patient agreed with the plan and demonstrated an understanding of the instructions.   The patient was advised to call back or seek an in-person evaluation if the symptoms worsen or if the condition fails to improve as anticipated.  I provided 45 minutes of non-face-to-face time during this encounter.  Addendum 02/01/2023 4:41 pm Stat CTA has resulted. No PE, however worsening Ground-glass airspace disease in the lingula and left lower lobe, progressed since prior study. Similar findings also now seen in the right lung base. Findings compatible with worsening pneumonia.  I have called the patient with the results. I have asked him to stop taking his amoxicillin  and I am calling in Levaquin 750 x 5 days. I have asked him to start this tonight.  I have told him to avoid golf or excessive exercise as there can be complications with tendons on this medication.Additionally I explained that patient's can have hypoglycemic events on this medication if they are on diabetic medications. We reviewed symptoms of hypoglycemia to include dizziness, diaphoresis, weakness. I instructed him to keep hard candies in his pocket , and that orange juice and Coke are good quick sources of sugar should he have a hypoglycemic event.   We also discussed that I would like him to be on probiotics while on the Levaquin, as he has been on Amoxicillin. I told him to eat 1 Activia yogurt daily or pick up Culturelle Probiotics at the pharmacy and take once daily while on antibiotics. He verbalized understanding of the above.   I have spent an additional 20 minutes reviewing the CT Chest and calling the patient with post visit ordering of testing, clinical documentation with the electronic health record, making appropriate referrals as documented, and communicating necessary information to the patient's healthcare team.   Bevelyn Ngo, NP  02/01/2023

## 2023-02-01 NOTE — Telephone Encounter (Signed)
Called and spoke with patient. I was able to get him scheduled for a video visit with SG this morning at 1130am.

## 2023-02-01 NOTE — Patient Instructions (Addendum)
It was good to see you on the video call today. I have ordered a STAT CTA Chest to evaluate for pulmonary embolism. You will get a call to get this scheduled.  It is very important that you use take your lovenox twice daily as is prescribed. With your cancer diagnosis, and your history of DVT's, you are at higher risk for clot related issues.  We will call you and  let you know what the scan shows. If there is a worsening of pneumonia, I will prescribe a different antibiotic. Please contact office for sooner follow up if symptoms do not improve or worsen or seek emergency care    Your CT Chest was negative for blood clots, which is great news, however you have worsening pneumonia . Stop taking Amoxicillin I am sending in a prescription for Levaquin 750 mg once daily x 5 days Avoid golf or excessive exercise as there can be complications with tendons on this medication. This medication can cause  hypoglycemic events in patient's on diabetes medications .  We reviewed symptoms of hypoglycemia to include dizziness, diaphoresis, weakness.  Remember to keep hard candies in your  pocket , and that orange juice and Coke are good quick sources of sugar should you have a hypoglycemic event.   Please take Culturelle Probiotics take once daily , or eat 1 tub of Activia Yogurt Daily  while on antibiotics Follow up with Dr. Judeth Horn 02/04/2023 at 2 pm to ensure you are improving Please contact office for sooner follow up if symptoms do not improve or worsen or seek emergency care

## 2023-02-04 ENCOUNTER — Ambulatory Visit: Payer: BC Managed Care – PPO | Admitting: Pulmonary Disease

## 2023-02-04 ENCOUNTER — Ambulatory Visit
Admission: RE | Admit: 2023-02-04 | Discharge: 2023-02-04 | Disposition: A | Discharge status: Home / Self Care | Payer: BC Managed Care – PPO | Source: Ambulatory Visit | Attending: Radiation Oncology | Admitting: Radiation Oncology

## 2023-02-04 ENCOUNTER — Encounter: Payer: Self-pay | Admitting: Pulmonary Disease

## 2023-02-04 ENCOUNTER — Telehealth: Payer: Self-pay

## 2023-02-04 ENCOUNTER — Other Ambulatory Visit: Payer: Self-pay

## 2023-02-04 VITALS — HR 76 | Wt 311.2 lb

## 2023-02-04 DIAGNOSIS — Z510 Encounter for antineoplastic radiation therapy: Secondary | ICD-10-CM | POA: Insufficient documentation

## 2023-02-04 DIAGNOSIS — C7951 Secondary malignant neoplasm of bone: Secondary | ICD-10-CM | POA: Diagnosis not present

## 2023-02-04 DIAGNOSIS — R0602 Shortness of breath: Secondary | ICD-10-CM | POA: Diagnosis not present

## 2023-02-04 DIAGNOSIS — Z5111 Encounter for antineoplastic chemotherapy: Secondary | ICD-10-CM | POA: Diagnosis not present

## 2023-02-04 DIAGNOSIS — C221 Intrahepatic bile duct carcinoma: Secondary | ICD-10-CM | POA: Insufficient documentation

## 2023-02-04 DIAGNOSIS — R600 Localized edema: Secondary | ICD-10-CM | POA: Diagnosis not present

## 2023-02-04 DIAGNOSIS — E8770 Fluid overload, unspecified: Secondary | ICD-10-CM | POA: Diagnosis not present

## 2023-02-04 DIAGNOSIS — D6861 Antiphospholipid syndrome: Secondary | ICD-10-CM | POA: Diagnosis not present

## 2023-02-04 DIAGNOSIS — R978 Other abnormal tumor markers: Secondary | ICD-10-CM | POA: Diagnosis not present

## 2023-02-04 DIAGNOSIS — D6481 Anemia due to antineoplastic chemotherapy: Secondary | ICD-10-CM | POA: Diagnosis not present

## 2023-02-04 DIAGNOSIS — Z86718 Personal history of other venous thrombosis and embolism: Secondary | ICD-10-CM | POA: Diagnosis not present

## 2023-02-04 LAB — RAD ONC ARIA SESSION SUMMARY
Course Elapsed Days: 28
Plan Fractions Treated to Date: 1
Plan Prescribed Dose Per Fraction: 6 Gy
Plan Total Fractions Prescribed: 5
Plan Total Prescribed Dose: 30 Gy
Reference Point Dosage Given to Date: 6 Gy
Reference Point Session Dosage Given: 6 Gy
Session Number: 6

## 2023-02-04 NOTE — Progress Notes (Signed)
@Patient  ID: Christopher Marsh, male    DOB: Jun 04, 1961, 62 y.o.   MRN: [id number]  Chief Complaint  Patient presents with   Follow-up    Pt is here for follow up to go over CTA results from Friday. Pt had video visit with sarah on Friday     Referring provider: Nelwyn Salisbury, MD  HPI:   62 y.o. man whom are seen for follow-up follow-up for multifocal infiltrates on chest imaging, waxing and waning over time.  Recent visit pulmonary office Kandice Robinsons, NP reviewed.  Had CT scan performed.  Again waxing and waning worsening infiltrate this time.  Placed on levofloxacin.  He does have some shortness of breath.  Notably CT scan concerning for signs of volume overload.  His weight is up several pounds since last visit.  Concern for cardiogenic volume overload causing some symptoms and chest imaging findings although certainly concern for issues discussed below.  HPI initial visit: Patient was admitted to the hospital 07/2022 with malaise, cough, shortness of breath.  Chest x-ray 07/19/2022 my review interpretation reveals bilateral left greater than right multifocal infiltrates.  When compared to 07/2021, left side appears slightly worse but similar, new infiltrates on the right on my review and interpretation.  Patient was admitted to the hospital 07/2022 with malaise, cough, shortness of breath.  This was preceded by a CT chest a couple days prior to my review interpretation reveals scattered nodular groundglass opacities most prominent in the left base but also in the lingula, mild in the right base.  Also revealed liver mass.  This was further work-up revealed metastatic cholangiocarcinoma.  He was treated with antibiotics.  His initial symptoms improved.  However 3 to 4 days later, similar symptoms recurred.  He was again admitted to the hospital 08/2022.  In the interim, he had a PET scan that showed worsening left greater than right groundglass opacities with worsening in the right base on  my review and interpretation.  Upon readmission to the hospital 08/2022 CTA PE protocol was obtained that showed no PE but worsening multifocal infiltrates and certainly worse in the right based on my review and interpretation.  Also developing mild bronchiectasis in the bases.  He was treated with IV antibiotics and completed additional 5 days of p.o. levofloxacin to complete 7-day course of antibiotics.  Again his symptoms have improved.  Breathing feels better.  He had mild hypoxemia upon admission.  No longer present.  He had a swallow evaluation in the hospital 08/2022, barium swallow that was normal.  Questionaires / Pulmonary Flowsheets:   ACT:      No data to display          MMRC:     No data to display          Epworth:      No data to display          Tests:   FENO:  No results found for: "NITRICOXIDE"  PFT:     No data to display          WALK:      No data to display          Imaging: Personally reviewed per EMR and discussion in this note DG FEMUR, MIN 2 VIEWS RIGHT  Result Date: 06/25/2023 CLINICAL DATA:  Right femur pain EXAM: RIGHT FEMUR 2 VIEWS COMPARISON:  None Available. FINDINGS: Mixed lytic and sclerotic lesion within the right ischial tuberosity again noted, stable since prior CT compatible  with metastasis. Moderate degenerative changes in the right hip with joint space narrowing and spurring. No acute bony abnormality. Specifically, no fracture, subluxation, or dislocation. IMPRESSION: No acute bony abnormality. Stable right ischial tuberosity metastasis. Electronically Signed   By: Charlett Nose M.D.   On: 06/25/2023 07:19   DG HIP UNILAT W OR W/O PELVIS 2-3 VIEWS RIGHT  Result Date: 06/25/2023 CLINICAL DATA:  Right hip pain EXAM: DG HIP (WITH OR WITHOUT PELVIS) 2-3V RIGHT COMPARISON:  CT chest, abdomen and pelvis 04/25/2023. FINDINGS: Prior left hip replacement. Moderate degenerative changes in the right hip with joint space narrowing  and spurring. No acute bony abnormality. Specifically, no fracture, subluxation, or dislocation. Continued mixed lytic and sclerotic lesion within the ischial tuberosities bilaterally compatible with metastases, stable since prior CT. IMPRESSION: No acute bony abnormality. Bilateral ischial metastases, unchanged since prior CT. Electronically Signed   By: Charlett Nose M.D.   On: 06/25/2023 07:19    Lab Results: Personally reviewed CBC    Component Value Date/Time   WBC 2.7 (L) 06/24/2023 0912   WBC 2.9 (L) 02/19/2023 0954   RBC 3.76 (L) 06/24/2023 0912   HGB 11.0 (L) 06/24/2023 0912   HCT 34.9 (L) 06/24/2023 0912   PLT 169 06/24/2023 0912   MCV 92.8 06/24/2023 0912   MCH 29.3 06/24/2023 0912   MCHC 31.5 06/24/2023 0912   RDW 17.2 (H) 06/24/2023 0912   LYMPHSABS 0.3 (L) 06/24/2023 0912   MONOABS 0.7 06/24/2023 0912   EOSABS 0.0 06/24/2023 0912   BASOSABS 0.0 06/24/2023 0912    BMET    Component Value Date/Time   NA 132 (L) 06/24/2023 0912   K 3.9 06/24/2023 0912   CL 103 06/24/2023 0912   CO2 25 06/24/2023 0912   GLUCOSE 88 06/24/2023 0912   BUN 9 06/24/2023 0912   CREATININE 0.82 06/24/2023 0912   CALCIUM 8.6 (L) 06/24/2023 0912   GFRNONAA >60 06/24/2023 0912   GFRAA >60 07/24/2017 0439    BNP    Component Value Date/Time   BNP 119.8 (H) 02/13/2023 1101    ProBNP No results found for: "PROBNP"  Specialty Problems       Pulmonary Problems   Pneumonia   Multifocal pneumonia   Snoring   Lung infiltrate on CT   Rhinitis   Sore throat   SOB (shortness of breath)   Pleural effusion    No Known Allergies  Immunization History  Administered Date(s) Administered   DT (Pediatric) 03/22/2018   Influenza Whole 09/14/2009   Influenza, Quadrivalent, Recombinant, Inj, Pf 08/18/2019   Influenza,inj,Quad PF,6+ Mos 08/28/2016, 07/23/2017, 10/20/2018, 08/08/2022   Influenza,inj,quad, With Preservative 10/29/2017   Influenza-Unspecified 10/12/2021   PFIZER(Purple  Top)SARS-COV-2 Vaccination 12/31/2019   PNEUMOCOCCAL CONJUGATE-20 08/08/2022   Pneumococcal Conjugate-13 08/28/2016   Pneumococcal Polysaccharide-23 10/20/2018   Td 12/08/2008, 03/22/2018    Past Medical History:  Diagnosis Date   Antiphospholipid antibody syndrome (HCC)    Arthritis    Cancer (HCC)    Diabetes mellitus without complication (HCC)    DVT (deep venous thrombosis) (HCC)    Lt leg   Dyspnea    Elevated lipids    Erythrocytosis    Hyperlipidemia    Hypertension    Lower extremity edema    Multinodular thyroid    Post-thrombotic syndrome     Tobacco History: Social History   Tobacco Use  Smoking Status Former   Current packs/day: 0.00   Average packs/day: 0.5 packs/day for 6.0 years (3.0 ttl pk-yrs)  Types: Cigarettes   Start date: 78   Quit date: 1982   Years since quitting: 42.6  Smokeless Tobacco Never  Tobacco Comments   Pt states when he did smoke he smoked at most 1 ppd. ALS 09/26/2022   Counseling given: Not Answered Tobacco comments: Pt states when he did smoke he smoked at most 1 ppd. ALS 09/26/2022   Continue to not smoke  Outpatient Encounter Medications as of 02/04/2023  Medication Sig   albuterol (VENTOLIN HFA) 108 (90 Base) MCG/ACT inhaler TAKE 2 PUFFS BY MOUTH EVERY 4 HOURS AS NEEDED   glipiZIDE (GLUCOTROL) 10 MG tablet Take 1 tablet (10 mg total) by mouth 2 (two) times daily before a meal.   glucose blood test strip 1 each by Other route as needed for other. accu chek aviva plusUse as instructed   lidocaine-prilocaine (EMLA) cream Apply 1 Application topically as needed.   LORazepam (ATIVAN) 0.5 MG tablet TAKE 1 TABLET (0.5 MG TOTAL) BY MOUTH EVERY 8 (EIGHT) HOURS AS NEEDED FOR ANXIETY (NAUSEA VOMITING).   MUCINEX D MAX STRENGTH [phone] MG TB12 Take 1,200 mg by mouth 2 (two) times daily as needed (for congestion or to loosen mucous in the chest).   pravastatin (PRAVACHOL) 20 MG tablet Take 1 tablet (20 mg total) by mouth daily.    tadalafil (CIALIS) 20 MG tablet Take 0.5-1 tablets (10-20 mg total) by mouth every other day as needed for erectile dysfunction.   [DISCONTINUED] amLODipine (NORVASC) 10 MG tablet Take 1 tablet (10 mg total) by mouth daily.   [DISCONTINUED] benzonatate (TESSALON) 200 MG capsule Take 1 capsule (200 mg total) by mouth 3 (three) times daily as needed for cough. (Patient not taking: Reported on 02/11/2023)   [DISCONTINUED] Calcium Carb-Cholecalciferol (CALCIUM 500 + D PO) Take 1 tablet by mouth daily.   [DISCONTINUED] dexamethasone (DECADRON) 4 MG tablet TAKE 2 TABLETS DAILY X 3 DAYS STARTING THE DAY AFTER CISPLATIN CHEMOTHERAPY. TAKE WITH FOOD. (Patient not taking: Reported on 02/20/2023)   [DISCONTINUED] enoxaparin (LOVENOX) 120 MG/0.8ML injection INJECT 0.8 MLS (120 MG TOTAL) INTO THE SKIN EVERY 12 (TWELVE) HOURS FOR 28 DAYS.   [DISCONTINUED] levofloxacin (LEVAQUIN) 750 MG tablet Take 1 tablet (750 mg total) by mouth daily. (Patient not taking: Reported on 02/11/2023)   [DISCONTINUED] magnesium chloride (SLOW-MAG) 64 MG TBEC SR tablet Take 1 tablet (64 mg total) by mouth daily. (Patient taking differently: Take 1 tablet by mouth 2 (two) times daily.)   [DISCONTINUED] metFORMIN (GLUCOPHAGE) 500 MG tablet TAKE TWO TABLETS BY MOUTH EVERY MORNING WITH BREAKFAST   [DISCONTINUED] ondansetron (ZOFRAN) 8 MG tablet Take 1 tablet (8 mg total) by mouth every 8 (eight) hours as needed for nausea or vomiting. Start on the third day after cisplatin.   [DISCONTINUED] prochlorperazine (COMPAZINE) 10 MG tablet TAKE 1 TABLET (10 MG TOTAL) BY MOUTH EVERY 6 HOURS AS NEEDED FOR NAUSEA AND VOMITING   [DISCONTINUED] triamcinolone cream (KENALOG) 0.1 % Apply 1 Application topically 3 (three) times daily. (Patient taking differently: Apply 1 Application topically 3 (three) times daily as needed (for bilateral, below-the-knees skin irritation).)   [EXPIRED] iopamidol (ISOVUE-370) 76 % injection 80 mL    No facility-administered  encounter medications on file as of 02/04/2023.     Review of Systems  Review of Systems  N/a Physical Exam  Pulse 76   Wt (!) 311 lb 3.2 oz (141.2 kg)   SpO2 98%   BMI 38.90 kg/m   Wt Readings from Last 5 Encounters:  06/24/23 246 lb (  111.6 kg)  06/17/23 249 lb 6.4 oz (113.1 kg)  06/04/23 263 lb (119.3 kg)  06/03/23 257 lb 12.8 oz (116.9 kg)  05/27/23 258 lb 11.2 oz (117.3 kg)    BMI Readings from Last 5 Encounters:  06/24/23 30.75 kg/m  06/17/23 31.17 kg/m  06/04/23 32.87 kg/m  06/03/23 32.22 kg/m  05/27/23 32.34 kg/m     Physical Exam General: Sitting in chair, no acute distress Eyes: EOMI, no icterus Neck: Supple, no JVP Pulmonary: Good air movement, scattered rhonchi, normal work of breathing Cardiovascular: Warm, chronic venous stasis changes in lower extremities Abdomen: Nondistended, bowel sounds present MSK: No synovitis, no effusion Neuro: Normal gait, no weakness Psych: Normal mood, full affect  Assessment & Plan:   Multifocal infiltrates: Present on the left on chest x-ray 07/2021.  Left greater than right particular lower lobes are present in the lingula as well 07/2022.  Worsened in density and worsening right lower lobe on CT images of PET scan 09/12/2022.  Interval worsening in the right lower lobe 09/15/2022.  Diffuse nearly nodular but areas of confluence groundglass opacities.  Given persistence do wonder about a recurrent process such as aspiration.  However, given metastatic cholangiocarcinoma, it is possible this represents lymphangitic spread.  Not necessarily consistent with images in 2022.  Pulmonary edema possible but prior improvement with antibiotics argues against this.  Noninfectious pneumonia, organizing pneumonia also possible.  Recent worsening symptoms CT scan with waxing waning worsening this time infiltrates.  Continue Levaquin.  Dyspnea exertion: With weight gain.  Concern for volume overload.   Return in about 3 months  (around 05/06/2023) for f/u Dr. Judeth Horn.   Karren Burly, MD 06/29/2023

## 2023-02-04 NOTE — Telephone Encounter (Signed)
contacted [facility] to follow up on referral for bone lesion. Referral is in review and they will let us know if they will be able to be accommodated.

## 2023-02-04 NOTE — Patient Instructions (Signed)
Nice to see you again  I worry about fluid overload and water weight contributing to weight gain and shortness of breath  Continue the antibiotic levofloxacin  I will messsage Dr. Cathie Hoops  RTC in 3 months or sooner as needed with Dr. Judeth Horn

## 2023-02-06 ENCOUNTER — Other Ambulatory Visit: Payer: Self-pay

## 2023-02-06 ENCOUNTER — Ambulatory Visit
Admission: RE | Admit: 2023-02-06 | Discharge: 2023-02-06 | Disposition: A | Discharge status: Home / Self Care | Payer: BC Managed Care – PPO | Source: Ambulatory Visit | Attending: Radiation Oncology | Admitting: Radiation Oncology

## 2023-02-06 DIAGNOSIS — Z510 Encounter for antineoplastic radiation therapy: Secondary | ICD-10-CM | POA: Diagnosis not present

## 2023-02-06 DIAGNOSIS — Z86718 Personal history of other venous thrombosis and embolism: Secondary | ICD-10-CM | POA: Diagnosis not present

## 2023-02-06 DIAGNOSIS — D6861 Antiphospholipid syndrome: Secondary | ICD-10-CM | POA: Diagnosis not present

## 2023-02-06 DIAGNOSIS — R0602 Shortness of breath: Secondary | ICD-10-CM | POA: Diagnosis not present

## 2023-02-06 DIAGNOSIS — R600 Localized edema: Secondary | ICD-10-CM | POA: Diagnosis not present

## 2023-02-06 DIAGNOSIS — C7951 Secondary malignant neoplasm of bone: Secondary | ICD-10-CM | POA: Diagnosis not present

## 2023-02-06 DIAGNOSIS — Z5111 Encounter for antineoplastic chemotherapy: Secondary | ICD-10-CM | POA: Diagnosis not present

## 2023-02-06 DIAGNOSIS — R978 Other abnormal tumor markers: Secondary | ICD-10-CM | POA: Diagnosis not present

## 2023-02-06 DIAGNOSIS — C221 Intrahepatic bile duct carcinoma: Secondary | ICD-10-CM | POA: Diagnosis not present

## 2023-02-06 DIAGNOSIS — D6481 Anemia due to antineoplastic chemotherapy: Secondary | ICD-10-CM | POA: Diagnosis not present

## 2023-02-06 LAB — RAD ONC ARIA SESSION SUMMARY
Course Elapsed Days: 30
Plan Fractions Treated to Date: 2
Plan Prescribed Dose Per Fraction: 6 Gy
Plan Total Fractions Prescribed: 5
Plan Total Prescribed Dose: 30 Gy
Reference Point Dosage Given to Date: 12 Gy
Reference Point Session Dosage Given: 6 Gy
Session Number: 7

## 2023-02-11 ENCOUNTER — Inpatient Hospital Stay: Payer: BC Managed Care – PPO

## 2023-02-11 ENCOUNTER — Other Ambulatory Visit: Payer: Self-pay

## 2023-02-11 ENCOUNTER — Ambulatory Visit
Admission: RE | Admit: 2023-02-11 | Discharge: 2023-02-11 | Disposition: A | Discharge status: Home / Self Care | Payer: BC Managed Care – PPO | Source: Ambulatory Visit | Attending: Hospice and Palliative Medicine | Admitting: Hospice and Palliative Medicine

## 2023-02-11 ENCOUNTER — Ambulatory Visit
Admission: RE | Admit: 2023-02-11 | Discharge: 2023-02-11 | Disposition: A | Discharge status: Home / Self Care | Payer: BC Managed Care – PPO | Source: Ambulatory Visit | Attending: Radiation Oncology | Admitting: Radiation Oncology

## 2023-02-11 ENCOUNTER — Other Ambulatory Visit: Payer: Self-pay | Admitting: *Deleted

## 2023-02-11 ENCOUNTER — Encounter: Payer: Self-pay | Admitting: Hospice and Palliative Medicine

## 2023-02-11 ENCOUNTER — Inpatient Hospital Stay (HOSPITAL_BASED_OUTPATIENT_CLINIC_OR_DEPARTMENT_OTHER): Payer: BC Managed Care – PPO | Admitting: Hospice and Palliative Medicine

## 2023-02-11 VITALS — BP 139/75 | HR 123 | Temp 98.0°F | Resp 20 | Ht 75.0 in | Wt 308.1 lb

## 2023-02-11 DIAGNOSIS — I82433 Acute embolism and thrombosis of popliteal vein, bilateral: Secondary | ICD-10-CM | POA: Diagnosis not present

## 2023-02-11 DIAGNOSIS — E119 Type 2 diabetes mellitus without complications: Secondary | ICD-10-CM | POA: Diagnosis not present

## 2023-02-11 DIAGNOSIS — R978 Other abnormal tumor markers: Secondary | ICD-10-CM | POA: Diagnosis not present

## 2023-02-11 DIAGNOSIS — Z5111 Encounter for antineoplastic chemotherapy: Secondary | ICD-10-CM | POA: Diagnosis not present

## 2023-02-11 DIAGNOSIS — R0609 Other forms of dyspnea: Secondary | ICD-10-CM | POA: Diagnosis not present

## 2023-02-11 DIAGNOSIS — R0602 Shortness of breath: Secondary | ICD-10-CM

## 2023-02-11 DIAGNOSIS — Z86718 Personal history of other venous thrombosis and embolism: Secondary | ICD-10-CM

## 2023-02-11 DIAGNOSIS — Z87891 Personal history of nicotine dependence: Secondary | ICD-10-CM | POA: Diagnosis not present

## 2023-02-11 DIAGNOSIS — D539 Nutritional anemia, unspecified: Secondary | ICD-10-CM | POA: Diagnosis not present

## 2023-02-11 DIAGNOSIS — C221 Intrahepatic bile duct carcinoma: Secondary | ICD-10-CM | POA: Insufficient documentation

## 2023-02-11 DIAGNOSIS — E669 Obesity, unspecified: Secondary | ICD-10-CM | POA: Diagnosis not present

## 2023-02-11 DIAGNOSIS — I825Y1 Chronic embolism and thrombosis of unspecified deep veins of right proximal lower extremity: Secondary | ICD-10-CM | POA: Diagnosis not present

## 2023-02-11 DIAGNOSIS — I110 Hypertensive heart disease with heart failure: Secondary | ICD-10-CM | POA: Diagnosis not present

## 2023-02-11 DIAGNOSIS — D649 Anemia, unspecified: Secondary | ICD-10-CM | POA: Diagnosis not present

## 2023-02-11 DIAGNOSIS — I2510 Atherosclerotic heart disease of native coronary artery without angina pectoris: Secondary | ICD-10-CM | POA: Diagnosis present

## 2023-02-11 DIAGNOSIS — R635 Abnormal weight gain: Secondary | ICD-10-CM

## 2023-02-11 DIAGNOSIS — Z7984 Long term (current) use of oral hypoglycemic drugs: Secondary | ICD-10-CM | POA: Diagnosis not present

## 2023-02-11 DIAGNOSIS — R600 Localized edema: Secondary | ICD-10-CM

## 2023-02-11 DIAGNOSIS — D6481 Anemia due to antineoplastic chemotherapy: Secondary | ICD-10-CM | POA: Diagnosis not present

## 2023-02-11 DIAGNOSIS — I5031 Acute diastolic (congestive) heart failure: Secondary | ICD-10-CM | POA: Diagnosis not present

## 2023-02-11 DIAGNOSIS — C7951 Secondary malignant neoplasm of bone: Secondary | ICD-10-CM | POA: Diagnosis not present

## 2023-02-11 DIAGNOSIS — J90 Pleural effusion, not elsewhere classified: Secondary | ICD-10-CM | POA: Diagnosis not present

## 2023-02-11 DIAGNOSIS — J9811 Atelectasis: Secondary | ICD-10-CM | POA: Diagnosis not present

## 2023-02-11 DIAGNOSIS — Z510 Encounter for antineoplastic radiation therapy: Secondary | ICD-10-CM | POA: Diagnosis not present

## 2023-02-11 DIAGNOSIS — I5032 Chronic diastolic (congestive) heart failure: Secondary | ICD-10-CM | POA: Diagnosis not present

## 2023-02-11 DIAGNOSIS — D6861 Antiphospholipid syndrome: Secondary | ICD-10-CM | POA: Diagnosis not present

## 2023-02-11 DIAGNOSIS — I82441 Acute embolism and thrombosis of right tibial vein: Secondary | ICD-10-CM | POA: Diagnosis not present

## 2023-02-11 DIAGNOSIS — I824Y3 Acute embolism and thrombosis of unspecified deep veins of proximal lower extremity, bilateral: Secondary | ICD-10-CM | POA: Diagnosis not present

## 2023-02-11 DIAGNOSIS — I10 Essential (primary) hypertension: Secondary | ICD-10-CM | POA: Diagnosis not present

## 2023-02-11 DIAGNOSIS — Z7901 Long term (current) use of anticoagulants: Secondary | ICD-10-CM | POA: Diagnosis not present

## 2023-02-11 DIAGNOSIS — I4891 Unspecified atrial fibrillation: Secondary | ICD-10-CM | POA: Diagnosis not present

## 2023-02-11 DIAGNOSIS — Z8505 Personal history of malignant neoplasm of liver: Secondary | ICD-10-CM | POA: Diagnosis not present

## 2023-02-11 DIAGNOSIS — E876 Hypokalemia: Secondary | ICD-10-CM | POA: Diagnosis present

## 2023-02-11 DIAGNOSIS — Z79899 Other long term (current) drug therapy: Secondary | ICD-10-CM | POA: Diagnosis not present

## 2023-02-11 DIAGNOSIS — E8770 Fluid overload, unspecified: Secondary | ICD-10-CM

## 2023-02-11 DIAGNOSIS — I82493 Acute embolism and thrombosis of other specified deep vein of lower extremity, bilateral: Secondary | ICD-10-CM | POA: Diagnosis not present

## 2023-02-11 DIAGNOSIS — I4892 Unspecified atrial flutter: Secondary | ICD-10-CM | POA: Diagnosis not present

## 2023-02-11 DIAGNOSIS — E785 Hyperlipidemia, unspecified: Secondary | ICD-10-CM | POA: Diagnosis not present

## 2023-02-11 DIAGNOSIS — E042 Nontoxic multinodular goiter: Secondary | ICD-10-CM | POA: Diagnosis not present

## 2023-02-11 LAB — RAD ONC ARIA SESSION SUMMARY
Course Elapsed Days: 35
Plan Fractions Treated to Date: 3
Plan Prescribed Dose Per Fraction: 6 Gy
Plan Total Fractions Prescribed: 5
Plan Total Prescribed Dose: 30 Gy
Reference Point Dosage Given to Date: 18 Gy
Reference Point Session Dosage Given: 6 Gy
Session Number: 8

## 2023-02-11 LAB — CMP (CANCER CENTER ONLY)
ALT: 17 U/L (ref 0–44)
AST: 26 U/L (ref 15–41)
Albumin: 3.4 g/dL — ABNORMAL LOW (ref 3.5–5.0)
Alkaline Phosphatase: 50 U/L (ref 38–126)
Anion gap: 11 (ref 5–15)
BUN: 7 mg/dL — ABNORMAL LOW (ref 8–23)
CO2: 22 mmol/L (ref 22–32)
Calcium: 8.7 mg/dL — ABNORMAL LOW (ref 8.9–10.3)
Chloride: 103 mmol/L (ref 98–111)
Creatinine: 0.84 mg/dL (ref 0.61–1.24)
GFR, Estimated: >60 mL/min (ref >60)
Glucose, Bld: 107 mg/dL — ABNORMAL HIGH (ref 70–99)
Potassium: 3.6 mmol/L (ref 3.5–5.1)
Sodium: 136 mmol/L (ref 135–145)
Total Bilirubin: 0.6 mg/dL (ref 0.3–1.2)
Total Protein: 6.2 g/dL — ABNORMAL LOW (ref 6.5–8.1)

## 2023-02-11 LAB — CBC WITH DIFFERENTIAL (CANCER CENTER ONLY)
Abs Immature Granulocytes: 0.01 10*3/uL (ref 0.00–0.07)
Basophils Absolute: 0 10*3/uL (ref 0.0–0.1)
Basophils Relative: 0 %
Eosinophils Absolute: 0 10*3/uL (ref 0.0–0.5)
Eosinophils Relative: 0 %
HCT: 27 % — ABNORMAL LOW (ref 39.0–52.0)
Hemoglobin: 8.6 g/dL — ABNORMAL LOW (ref 13.0–17.0)
Immature Granulocytes: 0 %
Lymphocytes Relative: 10 %
Lymphs Abs: 0.2 10*3/uL — ABNORMAL LOW (ref 0.7–4.0)
MCH: 32.7 pg (ref 26.0–34.0)
MCHC: 31.9 g/dL (ref 30.0–36.0)
MCV: 102.7 fL — ABNORMAL HIGH (ref 80.0–100.0)
Monocytes Absolute: 0.4 10*3/uL (ref 0.1–1.0)
Monocytes Relative: 18 %
Neutro Abs: 1.7 10*3/uL (ref 1.7–7.7)
Neutrophils Relative %: 72 %
Platelet Count: 88 10*3/uL — ABNORMAL LOW (ref 150–400)
RBC: 2.63 MIL/uL — ABNORMAL LOW (ref 4.22–5.81)
RDW: 16 % — ABNORMAL HIGH (ref 11.5–15.5)
WBC Count: 2.4 10*3/uL — ABNORMAL LOW (ref 4.0–10.5)
nRBC: 0 % (ref 0.0–0.2)

## 2023-02-11 LAB — BRAIN NATRIURETIC PEPTIDE: B Natriuretic Peptide: 180.2 pg/mL — ABNORMAL HIGH (ref 0.0–100.0)

## 2023-02-11 LAB — MAGNESIUM: Magnesium: 1.6 mg/dL — ABNORMAL LOW (ref 1.7–2.4)

## 2023-02-11 MED ORDER — FUROSEMIDE 20 MG PO TABS: 20.0000 mg | ORAL_TABLET | Freq: Every day | ORAL | 1 refills | Status: DC | PRN

## 2023-02-11 NOTE — Progress Notes (Signed)
Symptom Management Clinic [facility] at [facility] [phone] [phone]  Patient Care Team: Nelwyn Salisbury, MD as PCP - General (Family Medicine) Wyline Mood Alben Spittle, MD as PCP - Cardiology (Cardiology) [name], MD as Consulting Physician (Hematology and Oncology) Georgiana Spinner, NP as Nurse Practitioner (Vascular Surgery) Benita Gutter, RN as Oncology Nurse Navigator   NAME OF PATIENT: Christopher Marsh  [id number]  12/30/1960   DATE OF VISIT: 02/11/23  REASON FOR CONSULT: Christopher Marsh is a 62 y.o. male with multiple medical problems including antiphospholipid syndrome with history of recurrent DVTs on Lovenox, stage IV cholangiocarcinoma with bone metastasis.  Patient is on systemic chemotherapy with cisplatin/gemcitabine/Durvalumab.  Additionally, he is receiving XRT to bone lesions.  INTERVAL HISTORY: Patient recently saw pulmonary NP on 02/01/2023 with worsening shortness of breath and lower extremity edema.  Patient has history of recurrent pneumonia and had started taking Augmentin when shortness of breath started.  Patient was sent for CTA of the chest, which was negative for PE but findings concerning for progressive pneumonia.  Patient was rotated to Levaquin for 5 days.    Of note, pulmonary had some concern for silent aspiration given history of persistent pulmonary infiltrates.  Additionally, patient had voiced some noncompliance with utilization of his Lovenox.  Today, patient presented for daily XRT but was requested to be seen in [facility] with complaint of ongoing shortness of breath, lower extremity edema, and weight gain.  Patient reports that he had improvement with shortness of breath on Levaquin.  However, he has had persistent bilateral lower extremity edema and has noticed some weeping over the past couple of days.  He still has intermittent shortness of breath with exertion but none at rest and denies orthopnea.  No fever or  chills.  He reports recurrent pneumonia and states that he knows what pneumonia feels like and does not feel like it presently.  He does admit that he has reduced his Lovenox dosing to once daily as he had an episode of unprovoked epistaxis a couple weeks ago.  Denies any neurologic complaints. Denies recent fevers or illnesses. Denies any easy bleeding or bruising. Reports fair appetite and denies weight loss. Denies chest pain. Denies any nausea, vomiting, constipation, or diarrhea. Denies urinary complaints. Patient offers no further specific complaints today.   PAST MEDICAL HISTORY: Past Medical History:  Diagnosis Date   Antiphospholipid antibody syndrome    Arthritis    Cancer    Diabetes mellitus without complication    DVT (deep venous thrombosis)    Lt leg   Dyspnea    Elevated lipids    Erythrocytosis    Hyperlipidemia    Hypertension    Lower extremity edema    Multinodular thyroid    Post-thrombotic syndrome     PAST SURGICAL HISTORY:  Past Surgical History:  Procedure Laterality Date   COLONOSCOPY WITH PROPOFOL N/A 09/23/2018   Per Dr. Norma Fredrickson, polyp, repeat in 3 yrs    EYE MUSCLE SURGERY     JOINT REPLACEMENT Left 07/22/2017   PORTA CATH INSERTION N/A 12/24/2022   Procedure: PORTA CATH INSERTION;  Surgeon: Annice Needy, MD;  Location: [facility] INVASIVE CV LAB;  Service: Cardiovascular;  Laterality: N/A;   TOTAL HIP ARTHROPLASTY Left 07/22/2017   Procedure: TOTAL HIP ARTHROPLASTY;  Surgeon: Donato Heinz, MD;  Location: [facility] ORS;  Service: Orthopedics;  Laterality: Left;    HEMATOLOGY/ONCOLOGY HISTORY:  Oncology History  Cholangiocarcinoma  10/30/2021 Imaging  CT chest wo contrast 1. Heterogeneous bilateral ground-glass disease, multilobar but worst in the right middle lobe, lingula and lower lobes. There is progression of the abnormality since 10/12/2022. Though pneumonia could produce this appearance, given persistence and fluctuating appearance since October,  consider alternative entities such as hypersensitivity pneumonitis or idiopathic interstitial pneumonia. 2. Slightly enlarged pulmonary trunk, possible arterial hypertension 3. Redemonstrated large hypodense right hepatic lobe mass.   08/18/2022 Imaging   CT chest w contrast showed 1. Multifocal airspace disease in both lungs, left side greater than right. Findings are most compatible with multifocal pneumonia. 2. Multiple new hepatic lesions with enlarging lymph nodes in the upper abdomen and chest. Findings are concerning for metastatic disease. Based on the multifocal pneumonia, hepatic abscesses would also be in the differential diagnosis. Recommend further characterization of the abdomen and pelvis with CT with IV contrast. 3. Enlargement of the main pulmonary artery could be associated with pulmonary hypertension. 4. Coronary artery calcifications. 5. Multinodular goiter. Patient has known thyroid nodules and previous thyroid ultrasound.    08/18/2022 Imaging   CT abdomen pelvis w contrast  Interval development of 6.5 x 5.2 cm heterogeneously enhancing mass in right hepatic lobe. This is highly concerning for neoplasm or malignancy, and further evaluation with MRI is recommended.   Mildly enlarged periaortic adenopathy is noted, with the largest lymph node measuring 12 mm. Metastatic disease cannot be excluded. Mildly enlarged adenopathy is also noted in the gastrohepatic ligament, but this is unchanged compared to prior exam.   Stable bibasilar lung opacities are noted concerning for inflammation, atelectasis or possibly scarring.   Grossly stable multi-septated cystic lesion is seen involving upper pole of right kidney with peripheral calcifications compared to prior exam of 2019, most likely representing benign etiology.   Aortic Atherosclerosis    08/18/2022 - 08/21/2022 Hospital Admission   Admitted due to multifocal pneumonia, treated with Influenza panel negative,  COVID-negative, HIV negative, Complete course of antibiotics with Zithromax, Vantin     08/19/2022 Imaging   MR abdomen w wo contrast  Marked caudate lobe hypertrophy, highly suspicious for hepatic cirrhosis.   Numerous hypervascular masses throughout the right hepatic lobe, highly suspicious for multifocal hepatocellular carcinoma. Recommend correlation with AFP and consider tissue sampling.   Mild abdominal lymphadenopathy, with differential diagnosis including metastatic disease and reactive lymphadenopathy in setting of cirrhosis.   Bilateral lower lobe infiltrates, as better demonstrated on recent CT.    08/19/2022 Tumor Marker   AFP 4.1   08/27/2022 Initial Diagnosis   Cholangiocarcinoma  -08/20/22 Liver mass biopsy showed poorly differentiated carcinoma Immunohistochemical stains show that the poorly differentiated carcinoma is positive for CK7 and MOC-31, suggestive of a poorly differentiated adenocarcinoma.  Immunohistochemical stains for CK20, CDX2, HepPar 1 and arginase are negative.  Immunostain for glypican-3 shows very focal  likely nonspecific labeling.  This immunoprofile is nonspecific but can be compatible with a poorly differentiated primary cholangiocarcinoma in absence of any other lesions.   NGS showed BAP 1 mutation.  Case was presented at tumor board, I have also discussed with pathologist Dr.Rubinas,  IHC pattern is more consistent with adenocarcinoma, unlikely HCC [due to negative HepPar 1 and arginase], cholangiocarcinoma is favored, although this is a diagnosis of exclusion. Second opinion ar [facility]   08/27/2022 Cancer Staging   Staging form: Intrahepatic Bile Duct, AJCC 8th Edition - Clinical stage from 08/27/2022: Stage IV (cT2, cN1, cM1) - Signed by [name], MD on 09/13/2022 Stage prefix: Initial diagnosis   08/27/2022 Tumor Marker  CA 19.9  16 CEA 2.8   09/12/2022 Imaging   PET scan showed 1. Large right hepatic lobe mass is hypermetabolic and  consistent with known cholangiocarcinoma. 2. Scattered borderline enlarged upper abdominal lymph nodes do not show any significant FDG uptake. 3. 4 hypermetabolic bone lesions consistent with metastatic disease. 4. Progressive diffuse airspace process in the left lung could be due to atypical pneumonia, pulmonary hemorrhage or left-sided aspiration. 5. Aortic atherosclerosis.  09/12/2022 I had a phone discussion with patient after his PET scan resulted.  Recommend systemic chemotherapy.  Patient would like to defer until his second opinion visit at [facility].   09/15/2022 Imaging   CT angio chest pulmonary embolism protocol showed 1. No evidence for pulmonary embolism. 2. Progression of bilateral multifocal patchy ground-glass and interstitial opacities, left greater than right. Findings are concerning for multifocal pneumonia. 3. Air-fluid levels in the left lower lobe bronchi worrisome for aspiration. 4. Mediastinal and hilar lymphadenopathy has increased in size and number when compared to the prior study, likely reactive.5. Cardiomegaly. 6. Findings compatible with pulmonary artery hypertension.7. Stable right liver mass and complex right renal cystic lesion. 8. Stable enlargement of the left thyroid.   09/15/2022 - 09/17/2022 Hospital Admission   Patient was admitted due to shortness of breath, CT showed progression of bilateral multifocal patchy groundglass and interstitial opacities.  Left greater than right.  Concerning for multifocal pneumonia.  Patient was started on broad-spectrum antibiotics with significant improvement.  He was discharged on oral antibiotics.   09/19/2022 Miscellaneous   Patient went to [facility] for second opinion.  He was seen by Shirlee Latch, who felt that the presentation is consistent with metastatic cholangiocarcinoma, and agrees with the plan of cisplatin/gemcitabine/durvalumab..    10/11/2022 -  Chemotherapy   Patient is on Treatment Plan : Cisplatin D1,8 +  Gemcitabine + Durvalumab  D1,8 q21d x     10/12/2022 Imaging   CT chest w contrast 1. Bilateral airspace and ground-glass opacities have mildly decreased in amount and density when compared to the prior study. Findings are compatible with resolving multifocal pneumonia. Continued follow-up imaging recommended. 2. Stable enlarged heterogeneous left thyroid gland with 3.2 cm nodule. This can be further evaluated with dedicated ultrasound. 3. Stable hepatic and right renal lesions, incompletely evaluated.   Aortic Atherosclerosis (ICD10-I70.0).   12/11/2022 Imaging   PET showed  1. There is been mild increase in size of the tracer avid mass within right lobe of liver compatible with known cholangiocarcinoma. The degree of tracer uptake however is slightly decreased in the interval. 2. Multifocal tracer avid bone metastases are again noted. When compared with the previous exam there has been an mixed interval response to therapy. Specifically, there has been interval increase in size and FDG uptake associated with the right ischial tuberosity lesion and there is a new lesion within the posterior left iliac bone. Decreased tracer uptake is associated with the left scapular lesion and there is stable tracer uptake associated with the right T1 transverse process lesion. 3. Persistent ground-glass and airspace densities within the left upper lobe and left lower lobe compatible with inflammatory or infectious process. Interval resolution of previous right lung ground-glass and airspace densities.    12/24/2022 Procedure   Medi port placement by Dr. Wyn Quaker   01/22/2023 Imaging   CT chest abdomen pelvis with contrast 1. Hypodense lesion of the right lobe of the liver, hepatic segment VII/VIII is slightly diminished in size, consistent with treatment response. 2. No evident change in additional  smaller hypodense lesions scattered throughout the right lobe of the liver, these more difficult to see on prior  noncontrast PET-CT. 3. Unchanged enlarged celiac axis, gastrohepatic ligament, and retroperitoneal lymph nodes. Unchanged prominent high right paratracheal lymph nodes. 4. Unchanged osseous metastases. 5. Slightly improved, although still extensive ground-glass airspace opacity primarily seen throughout the left lower lobe and lingula,nonspecific and infectious or inflammatory. 6. Coronary artery disease. 7. Emphysema.     ALLERGIES:  has No Known Allergies.  MEDICATIONS:  Current Outpatient Medications  Medication Sig Dispense Refill   albuterol (VENTOLIN HFA) 108 (90 Base) MCG/ACT inhaler TAKE 2 PUFFS BY MOUTH EVERY 4 HOURS AS NEEDED 8.5 each 1   amLODipine (NORVASC) 10 MG tablet Take 1 tablet (10 mg total) by mouth daily. 30 tablet 0   benzonatate (TESSALON) 200 MG capsule Take 1 capsule (200 mg total) by mouth 3 (three) times daily as needed for cough. 30 capsule 3   Calcium Carb-Cholecalciferol (CALCIUM 500 + D PO) Take 1 tablet by mouth daily.     dexamethasone (DECADRON) 4 MG tablet TAKE 2 TABLETS DAILY X 3 DAYS STARTING THE DAY AFTER CISPLATIN CHEMOTHERAPY. TAKE WITH FOOD. 30 tablet 1   enoxaparin (LOVENOX) 120 MG/0.8ML injection INJECT 0.8 MLS (120 MG TOTAL) INTO THE SKIN EVERY 12 (TWELVE) HOURS FOR 28 DAYS. 44.8 mL 2   glipiZIDE (GLUCOTROL) 10 MG tablet Take 1 tablet (10 mg total) by mouth 2 (two) times daily before a meal. (Patient taking differently: Take 10 mg by mouth daily before breakfast.) 180 tablet 3   glucose blood test strip 1 each by Other route as needed for other. accu chek aviva plusUse as instructed     levofloxacin (LEVAQUIN) 750 MG tablet Take 1 tablet (750 mg total) by mouth daily. 5 tablet 0   lidocaine-prilocaine (EMLA) cream Apply 1 Application topically as needed. 30 g 5   LORazepam (ATIVAN) 0.5 MG tablet TAKE 1 TABLET (0.5 MG TOTAL) BY MOUTH EVERY 8 (EIGHT) HOURS AS NEEDED FOR ANXIETY (NAUSEA VOMITING). 60 tablet 0   magnesium chloride (SLOW-MAG) 64 MG TBEC  SR tablet Take 1 tablet (64 mg total) by mouth daily. 30 tablet 3   metFORMIN (GLUCOPHAGE) 500 MG tablet TAKE TWO TABLETS BY MOUTH EVERY MORNING WITH BREAKFAST 180 tablet 1   MUCINEX D MAX STRENGTH 718 160 8312 MG TB12 Take 1,200 mg by mouth 2 (two) times daily as needed (for congestion or to loosen mucous in the chest).     ondansetron (ZOFRAN) 8 MG tablet Take 1 tablet (8 mg total) by mouth every 8 (eight) hours as needed for nausea or vomiting. Start on the third day after cisplatin. 90 tablet 1   pravastatin (PRAVACHOL) 20 MG tablet Take 1 tablet (20 mg total) by mouth daily. 90 tablet 3   prochlorperazine (COMPAZINE) 10 MG tablet TAKE 1 TABLET (10 MG TOTAL) BY MOUTH EVERY 6 HOURS AS NEEDED FOR NAUSEA AND VOMITING 90 tablet 1   tadalafil (CIALIS) 20 MG tablet Take 0.5-1 tablets (10-20 mg total) by mouth every other day as needed for erectile dysfunction. 20 tablet 11   triamcinolone cream (KENALOG) 0.1 % Apply 1 Application topically 3 (three) times daily. (Patient taking differently: Apply 1 Application topically 3 (three) times daily as needed (for bilateral, below-the-knees skin irritation).) 453.6 g 1   No current facility-administered medications for this visit.    VITAL SIGNS: There were no vitals taken for this visit. There were no vitals filed for this visit.  Estimated body mass index  is 38.9 kg/m as calculated from the following:   Height as of 12/25/22:  (1.905 m).   Weight as of 02/04/23: 311 lb 3.2 oz (141.2 kg).  LABS: CBC:    Component Value Date/Time   WBC 1.8 (L) 01/30/2023 0847   HGB 9.2 (L) 01/30/2023 0847   HCT 28.5 (L) 01/30/2023 0847   PLT 218 01/30/2023 0847   MCV 100.4 (H) 01/30/2023 0847   NEUTROABS 1.1 (L) 01/30/2023 0847   LYMPHSABS 0.4 (L) 01/30/2023 0847   MONOABS 0.2 01/30/2023 0847   EOSABS 0.0 01/30/2023 0847   BASOSABS 0.0 01/30/2023 0847   Comprehensive Metabolic Panel:    Component Value Date/Time   NA 134 (L) 01/30/2023 0847   K 3.8  01/30/2023 0847   CL 102 01/30/2023 0847   CO2 24 01/30/2023 0847   BUN 11 01/30/2023 0847   CREATININE 0.71 01/30/2023 0847   GLUCOSE 131 (H) 01/30/2023 0847   CALCIUM 8.1 (L) 01/30/2023 0847   AST 22 01/30/2023 0847   ALT 26 01/30/2023 0847   ALKPHOS 50 01/30/2023 0847   BILITOT 0.3 01/30/2023 0847   PROT 5.8 (L) 01/30/2023 0847   ALBUMIN 3.3 (L) 01/30/2023 0847    RADIOGRAPHIC STUDIES: CT Angio Chest Pulmonary Embolism (PE) W or WO Contrast  Result Date: 02/01/2023 CLINICAL DATA:  Shortness of breath EXAM: CT ANGIOGRAPHY CHEST WITH CONTRAST TECHNIQUE: Multidetector CT imaging of the chest was performed using the standard protocol during bolus administration of intravenous contrast. Multiplanar CT image reconstructions and MIPs were obtained to evaluate the vascular anatomy. RADIATION DOSE REDUCTION: This exam was performed according to the departmental dose-optimization program which includes automated exposure control, adjustment of the mA and/or kV according to patient size and/or use of iterative reconstruction technique. CONTRAST:  80mL ISOVUE-370 IOPAMIDOL (ISOVUE-370) INJECTION 76% COMPARISON:  01/22/2023 FINDINGS: Cardiovascular: No filling defects in the pulmonary arteries to suggest pulmonary emboli. Heart is normal size. Aorta is normal caliber. Scattered coronary artery and aortic calcifications. Mediastinum/Nodes: No mediastinal, hilar, or axillary adenopathy. Trachea and esophagus are unremarkable. Large left thyroid goiter again noted, unchanged. Lungs/Pleura: Biapical scarring. Ground-glass airspace disease in the lingula and left lower lobe, progressed since prior study. Similar findings also now seen in the right lung base. Findings compatible with worsening pneumonia. No visible effusions or pneumothorax. Upper Abdomen: Large right hepatic lobe mass again seen as on prior imaging compatible with patient's known cholangiocarcinoma. Musculoskeletal: No acute findings Review of the  MIP images confirms the above findings. IMPRESSION: Airspace disease within the lingula and left lower lobe as well as right lung base compatible with pneumonia, worsening since prior study. No evidence of pulmonary embolus. Right hepatic lobe mass again noted as seen on recent imaging and cancer staging. Coronary artery disease. Aortic Atherosclerosis (ICD10-I70.0). Electronically Signed   By: Charlett Nose M.D.   On: 02/01/2023 15:46   CT CHEST ABDOMEN PELVIS W CONTRAST  Result Date: 01/23/2023 CLINICAL DATA:  Cholangiocarcinoma * Tracking Code: BO * EXAM: CT CHEST, ABDOMEN, AND PELVIS WITH CONTRAST TECHNIQUE: Multidetector CT imaging of the chest, abdomen and pelvis was performed following the standard protocol during bolus administration of intravenous contrast. RADIATION DOSE REDUCTION: This exam was performed according to the departmental dose-optimization program which includes automated exposure control, adjustment of the mA and/or kV according to patient size and/or use of iterative reconstruction technique. CONTRAST:  OMNIPAQUE IOHEXOL 300 MG/ML  SOLN COMPARISON:  PET-CT, 12/10/2022 FINDINGS: CT CHEST FINDINGS Cardiovascular: Right chest port catheter. Normal heart  size. Scattered left and right coronary artery calcifications. No pericardial effusion. Mediastinum/Nodes: Unchanged prominent high right paratracheal lymph nodes (series 2, image 97). Unchanged heterogeneous left lobe thyroid goiter, no further follow-up or characterization is required in the setting of known metastatic primary malignancy (series 2, image 7). In the setting of significant comorbidities or limited life expectancy, no follow-up recommended (ref: J Am Coll Radiol. 2015 Feb;12(2): 143-50). Trachea, and esophagus demonstrate no significant findings. Lungs/Pleura: Slightly improved, although still extensive ground-glass airspace opacity primarily seen throughout the left lower lobe and lingula (series 3, image 115). Minimal  paraseptal emphysema. No pleural effusion or pneumothorax. Musculoskeletal: No chest wall abnormality. No acute osseous findings. CT ABDOMEN PELVIS FINDINGS Hepatobiliary: Hypodense lesion of the right lobe of the liver, hepatic segment VII/VIII is slightly diminished in size measuring 6.8 x 6.3 cm, previously 8.3 x 6.1 cm when measured similarly (series 2, image 67). No evident change in additional smaller hypodense lesions scattered throughout the right lobe of the liver, these more difficult to see on prior noncontrast PET-CT, for example in the superior liver dome, hepatic segment VIII measuring 1.6 x 1.5 cm (series 2, image 62). No gallstones, gallbladder wall thickening, or biliary dilatation. Pancreas: Unremarkable. No pancreatic ductal dilatation or surrounding inflammatory changes. Spleen: Normal in size without significant abnormality. Adrenals/Urinary Tract: Adrenal glands are unremarkable. Unchanged benign, thinly septated and rim calcified cyst of the superior pole of the right kidney, as well as additional simple bilateral renal cortical cysts, for which no further follow-up or characterization is required. Kidneys are otherwise normal, without renal calculi, solid lesion, or hydronephrosis. Bladder is unremarkable. Stomach/Bowel: Stomach is within normal limits. Appendix appears normal. No evidence of bowel wall thickening, distention, or inflammatory changes. Vascular/Lymphatic: Aortic atherosclerosis. Similar enlarged celiac axis, gastrohepatic ligament, and retroperitoneal lymph nodes, largest gastrohepatic ligament node measuring 2.6 x 1.5 cm (series 2, image 67). Reproductive: Prostatomegaly. Other: No abdominal wall hernia or abnormality. No ascites. Musculoskeletal: No acute osseous findings. Status post left hip total arthroplasty. Similar appearance of a lytic metastasis of the right ischial tuberosity (series 2, image 141). Similar sclerotic metastasis of the posterior left ilium (series 2,  image 122). Similar mixed lytic and sclerotic metastasis of the left scapular body (series 2, image 9). Similar lytic metastasis of the right T1 transverse process (series 2, image 4). IMPRESSION: 1. Hypodense lesion of the right lobe of the liver, hepatic segment VII/VIII is slightly diminished in size, consistent with treatment response. 2. No evident change in additional smaller hypodense lesions scattered throughout the right lobe of the liver, these more difficult to see on prior noncontrast PET-CT. 3. Unchanged enlarged celiac axis, gastrohepatic ligament, and retroperitoneal lymph nodes. Unchanged prominent high right paratracheal lymph nodes. 4. Unchanged osseous metastases. 5. Slightly improved, although still extensive ground-glass airspace opacity primarily seen throughout the left lower lobe and lingula, nonspecific and infectious or inflammatory. 6. Coronary artery disease. 7. Emphysema. Aortic Atherosclerosis (ICD10-I70.0) and Emphysema (ICD10-J43.9). Electronically Signed   By: Jearld Lesch M.D.   On: 01/23/2023 08:56    PERFORMANCE STATUS (ECOG) : 1 - Symptomatic but completely ambulatory  Review of Systems Unless otherwise noted, a complete review of systems is negative.  Physical Exam General: NAD Cardiovascular: regular rate and rhythm Pulmonary: crackles bases ant/post fields  L>R Abdomen: soft, nontender, + bowel sounds GU: no suprapubic tenderness Extremities: BLE edema, no joint deformities Skin: no rashes Neurological: Weakness but otherwise nonfocal  IMPRESSION/PLAN: BLE Edema/SOB -suspect hypervolemia with probable lymphedema component with  recent weight gain and persistent bilateral lower extremity edema.  Patient has history of lymphedema and is followed by vascular for that.  Patient was started on a lymphedema pump by Dr. Wyn Quaker in June 2023.  Patient had an echocardiogram 6 months ago without clear evidence of heart failure.  Patient has had recent increased weight and  is up 11 pounds over the past 3 weeks.  Today, BNP mildly increased.  Suspect that lymphedema is the primary etiology but will send for echo, lower extremity Dopplers, and chest x-ray to complete workup.  Will start him on Lasix 20 mg daily as needed.  Patient says that he has taken Lasix in the past with good effect.  Discussed importance of consuming potassium rich foods on Lasix.  Will also refer to [facility] for consideration of other strategies for lymphedema management.  Case and plan discussed with Dr. Cathie Hoops.  Patient will return next week for labs, MD visit, and possible Mg/K replacement  Patient expressed understanding and was in agreement with this plan. He also understands that He can call clinic at any time with any questions, concerns, or complaints.   Thank you for allowing me to participate in the care of this very pleasant patient.   Time Total: 25 minutes  Visit consisted of counseling and education dealing with the complex and emotionally intense issues of symptom management in the setting of serious illness.Greater than 50%  of this time was spent counseling and coordinating care related to the above assessment and plan.  Signed by: Laurette Schimke, PhD, NP-C

## 2023-02-12 ENCOUNTER — Ambulatory Visit: Payer: BC Managed Care – PPO

## 2023-02-12 ENCOUNTER — Other Ambulatory Visit: Payer: Self-pay

## 2023-02-13 ENCOUNTER — Inpatient Hospital Stay: Payer: BC Managed Care – PPO | Admitting: Occupational Therapy

## 2023-02-13 ENCOUNTER — Ambulatory Visit: Payer: BC Managed Care – PPO

## 2023-02-13 ENCOUNTER — Inpatient Hospital Stay
Admission: EM | Admit: 2023-02-13 | Discharge: 2023-02-15 | DRG: 309 | Disposition: A | Discharge status: Home / Self Care | Payer: BC Managed Care – PPO | Attending: Osteopathic Medicine | Admitting: Osteopathic Medicine

## 2023-02-13 ENCOUNTER — Other Ambulatory Visit: Payer: Self-pay

## 2023-02-13 ENCOUNTER — Inpatient Hospital Stay (HOSPITAL_BASED_OUTPATIENT_CLINIC_OR_DEPARTMENT_OTHER): Payer: BC Managed Care – PPO | Admitting: Oncology

## 2023-02-13 ENCOUNTER — Inpatient Hospital Stay: Payer: BC Managed Care – PPO

## 2023-02-13 ENCOUNTER — Encounter: Payer: Self-pay | Admitting: Emergency Medicine

## 2023-02-13 ENCOUNTER — Inpatient Hospital Stay (HOSPITAL_COMMUNITY)
Admit: 2023-02-13 | Discharge: 2023-02-13 | Disposition: A | Discharge status: Home / Self Care | Payer: BC Managed Care – PPO | Attending: Cardiology | Admitting: Cardiology

## 2023-02-13 ENCOUNTER — Encounter: Payer: Self-pay | Admitting: Oncology

## 2023-02-13 ENCOUNTER — Ambulatory Visit: Admission: RE | Admit: 2023-02-13 | Payer: BC Managed Care – PPO | Source: Ambulatory Visit

## 2023-02-13 ENCOUNTER — Emergency Department: Payer: BC Managed Care – PPO

## 2023-02-13 VITALS — BP 140/65 | HR 140 | Temp 97.2°F | Wt 309.1 lb

## 2023-02-13 DIAGNOSIS — I4892 Unspecified atrial flutter: Secondary | ICD-10-CM | POA: Diagnosis not present

## 2023-02-13 DIAGNOSIS — R040 Epistaxis: Secondary | ICD-10-CM | POA: Diagnosis present

## 2023-02-13 DIAGNOSIS — Z7984 Long term (current) use of oral hypoglycemic drugs: Secondary | ICD-10-CM | POA: Diagnosis not present

## 2023-02-13 DIAGNOSIS — E042 Nontoxic multinodular goiter: Secondary | ICD-10-CM | POA: Diagnosis present

## 2023-02-13 DIAGNOSIS — Z7901 Long term (current) use of anticoagulants: Secondary | ICD-10-CM

## 2023-02-13 DIAGNOSIS — J9811 Atelectasis: Secondary | ICD-10-CM | POA: Diagnosis present

## 2023-02-13 DIAGNOSIS — C221 Intrahepatic bile duct carcinoma: Secondary | ICD-10-CM

## 2023-02-13 DIAGNOSIS — T451X5A Adverse effect of antineoplastic and immunosuppressive drugs, initial encounter: Secondary | ICD-10-CM

## 2023-02-13 DIAGNOSIS — I82433 Acute embolism and thrombosis of popliteal vein, bilateral: Secondary | ICD-10-CM | POA: Diagnosis present

## 2023-02-13 DIAGNOSIS — R000 Tachycardia, unspecified: Secondary | ICD-10-CM | POA: Insufficient documentation

## 2023-02-13 DIAGNOSIS — I110 Hypertensive heart disease with heart failure: Secondary | ICD-10-CM | POA: Diagnosis present

## 2023-02-13 DIAGNOSIS — Z5111 Encounter for antineoplastic chemotherapy: Secondary | ICD-10-CM | POA: Diagnosis not present

## 2023-02-13 DIAGNOSIS — D539 Nutritional anemia, unspecified: Secondary | ICD-10-CM | POA: Diagnosis not present

## 2023-02-13 DIAGNOSIS — E876 Hypokalemia: Secondary | ICD-10-CM | POA: Diagnosis present

## 2023-02-13 DIAGNOSIS — E119 Type 2 diabetes mellitus without complications: Secondary | ICD-10-CM

## 2023-02-13 DIAGNOSIS — Z8505 Personal history of malignant neoplasm of liver: Secondary | ICD-10-CM | POA: Diagnosis not present

## 2023-02-13 DIAGNOSIS — R600 Localized edema: Secondary | ICD-10-CM | POA: Diagnosis not present

## 2023-02-13 DIAGNOSIS — Z79899 Other long term (current) drug therapy: Secondary | ICD-10-CM

## 2023-02-13 DIAGNOSIS — R918 Other nonspecific abnormal finding of lung field: Secondary | ICD-10-CM

## 2023-02-13 DIAGNOSIS — D649 Anemia, unspecified: Secondary | ICD-10-CM | POA: Diagnosis not present

## 2023-02-13 DIAGNOSIS — I5032 Chronic diastolic (congestive) heart failure: Secondary | ICD-10-CM | POA: Diagnosis present

## 2023-02-13 DIAGNOSIS — Z86718 Personal history of other venous thrombosis and embolism: Secondary | ICD-10-CM

## 2023-02-13 DIAGNOSIS — E669 Obesity, unspecified: Secondary | ICD-10-CM | POA: Diagnosis not present

## 2023-02-13 DIAGNOSIS — I890 Lymphedema, not elsewhere classified: Secondary | ICD-10-CM | POA: Diagnosis present

## 2023-02-13 DIAGNOSIS — E785 Hyperlipidemia, unspecified: Secondary | ICD-10-CM | POA: Diagnosis present

## 2023-02-13 DIAGNOSIS — Z87891 Personal history of nicotine dependence: Secondary | ICD-10-CM

## 2023-02-13 DIAGNOSIS — Z510 Encounter for antineoplastic radiation therapy: Secondary | ICD-10-CM | POA: Diagnosis not present

## 2023-02-13 DIAGNOSIS — Z825 Family history of asthma and other chronic lower respiratory diseases: Secondary | ICD-10-CM

## 2023-02-13 DIAGNOSIS — C7951 Secondary malignant neoplasm of bone: Secondary | ICD-10-CM | POA: Diagnosis present

## 2023-02-13 DIAGNOSIS — D6481 Anemia due to antineoplastic chemotherapy: Secondary | ICD-10-CM | POA: Diagnosis present

## 2023-02-13 DIAGNOSIS — I5031 Acute diastolic (congestive) heart failure: Secondary | ICD-10-CM

## 2023-02-13 DIAGNOSIS — I10 Essential (primary) hypertension: Secondary | ICD-10-CM

## 2023-02-13 DIAGNOSIS — I824Y3 Acute embolism and thrombosis of unspecified deep veins of proximal lower extremity, bilateral: Secondary | ICD-10-CM

## 2023-02-13 DIAGNOSIS — R0602 Shortness of breath: Secondary | ICD-10-CM | POA: Diagnosis not present

## 2023-02-13 DIAGNOSIS — Z800 Family history of malignant neoplasm of digestive organs: Secondary | ICD-10-CM

## 2023-02-13 DIAGNOSIS — I4430 Unspecified atrioventricular block: Secondary | ICD-10-CM | POA: Diagnosis present

## 2023-02-13 DIAGNOSIS — M7989 Other specified soft tissue disorders: Secondary | ICD-10-CM | POA: Diagnosis present

## 2023-02-13 DIAGNOSIS — R0600 Dyspnea, unspecified: Secondary | ICD-10-CM | POA: Diagnosis present

## 2023-02-13 DIAGNOSIS — I82441 Acute embolism and thrombosis of right tibial vein: Secondary | ICD-10-CM | POA: Diagnosis present

## 2023-02-13 DIAGNOSIS — I483 Typical atrial flutter: Secondary | ICD-10-CM | POA: Diagnosis present

## 2023-02-13 DIAGNOSIS — I4891 Unspecified atrial fibrillation: Secondary | ICD-10-CM | POA: Diagnosis present

## 2023-02-13 DIAGNOSIS — Z6838 Body mass index (BMI) 38.0-38.9, adult: Secondary | ICD-10-CM

## 2023-02-13 DIAGNOSIS — I2510 Atherosclerotic heart disease of native coronary artery without angina pectoris: Secondary | ICD-10-CM | POA: Diagnosis present

## 2023-02-13 DIAGNOSIS — D6861 Antiphospholipid syndrome: Secondary | ICD-10-CM

## 2023-02-13 DIAGNOSIS — J90 Pleural effusion, not elsewhere classified: Secondary | ICD-10-CM | POA: Diagnosis present

## 2023-02-13 DIAGNOSIS — I82493 Acute embolism and thrombosis of other specified deep vein of lower extremity, bilateral: Secondary | ICD-10-CM | POA: Diagnosis not present

## 2023-02-13 DIAGNOSIS — I82409 Acute embolism and thrombosis of unspecified deep veins of unspecified lower extremity: Secondary | ICD-10-CM | POA: Diagnosis present

## 2023-02-13 DIAGNOSIS — Z803 Family history of malignant neoplasm of breast: Secondary | ICD-10-CM

## 2023-02-13 DIAGNOSIS — R0609 Other forms of dyspnea: Secondary | ICD-10-CM | POA: Diagnosis not present

## 2023-02-13 DIAGNOSIS — Z8701 Personal history of pneumonia (recurrent): Secondary | ICD-10-CM

## 2023-02-13 DIAGNOSIS — Z91148 Patient's other noncompliance with medication regimen for other reason: Secondary | ICD-10-CM

## 2023-02-13 DIAGNOSIS — Z96642 Presence of left artificial hip joint: Secondary | ICD-10-CM | POA: Diagnosis present

## 2023-02-13 DIAGNOSIS — Z7902 Long term (current) use of antithrombotics/antiplatelets: Secondary | ICD-10-CM

## 2023-02-13 DIAGNOSIS — R978 Other abnormal tumor markers: Secondary | ICD-10-CM | POA: Diagnosis not present

## 2023-02-13 LAB — CBC WITH DIFFERENTIAL/PLATELET
Abs Immature Granulocytes: 0.02 10*3/uL (ref 0.00–0.07)
Basophils Absolute: 0 10*3/uL (ref 0.0–0.1)
Basophils Relative: 1 %
Eosinophils Absolute: 0 10*3/uL (ref 0.0–0.5)
Eosinophils Relative: 1 %
HCT: 27 % — ABNORMAL LOW (ref 39.0–52.0)
Hemoglobin: 8.5 g/dL — ABNORMAL LOW (ref 13.0–17.0)
Immature Granulocytes: 1 %
Lymphocytes Relative: 11 %
Lymphs Abs: 0.4 10*3/uL — ABNORMAL LOW (ref 0.7–4.0)
MCH: 32.7 pg (ref 26.0–34.0)
MCHC: 31.5 g/dL (ref 30.0–36.0)
MCV: 103.8 fL — ABNORMAL HIGH (ref 80.0–100.0)
Monocytes Absolute: 0.8 10*3/uL (ref 0.1–1.0)
Monocytes Relative: 21 %
Neutro Abs: 2.5 10*3/uL (ref 1.7–7.7)
Neutrophils Relative %: 65 %
Platelets: 186 10*3/uL (ref 150–400)
RBC: 2.6 MIL/uL — ABNORMAL LOW (ref 4.22–5.81)
RDW: 15.9 % — ABNORMAL HIGH (ref 11.5–15.5)
WBC: 3.7 10*3/uL — ABNORMAL LOW (ref 4.0–10.5)
nRBC: 0 % (ref 0.0–0.2)

## 2023-02-13 LAB — COMPREHENSIVE METABOLIC PANEL
ALT: 16 U/L (ref 0–44)
AST: 24 U/L (ref 15–41)
Albumin: 3.5 g/dL (ref 3.5–5.0)
Alkaline Phosphatase: 52 U/L (ref 38–126)
Anion gap: 8 (ref 5–15)
BUN: 7 mg/dL — ABNORMAL LOW (ref 8–23)
CO2: 23 mmol/L (ref 22–32)
Calcium: 8.3 mg/dL — ABNORMAL LOW (ref 8.9–10.3)
Chloride: 106 mmol/L (ref 98–111)
Creatinine, Ser: 0.89 mg/dL (ref 0.61–1.24)
GFR, Estimated: >60 mL/min (ref >60)
Glucose, Bld: 113 mg/dL — ABNORMAL HIGH (ref 70–99)
Potassium: 3.4 mmol/L — ABNORMAL LOW (ref 3.5–5.1)
Sodium: 137 mmol/L (ref 135–145)
Total Bilirubin: 0.4 mg/dL (ref 0.3–1.2)
Total Protein: 6 g/dL — ABNORMAL LOW (ref 6.5–8.1)

## 2023-02-13 LAB — PHOSPHORUS: Phosphorus: 3.3 mg/dL (ref 2.5–4.6)

## 2023-02-13 LAB — BRAIN NATRIURETIC PEPTIDE: B Natriuretic Peptide: 119.8 pg/mL — ABNORMAL HIGH (ref 0.0–100.0)

## 2023-02-13 LAB — TSH: TSH: 0.269 u[IU]/mL — ABNORMAL LOW (ref 0.350–4.500)

## 2023-02-13 LAB — TROPONIN I (HIGH SENSITIVITY): Troponin I (High Sensitivity): 11 ng/L (ref <18)

## 2023-02-13 LAB — MAGNESIUM: Magnesium: 1.5 mg/dL — ABNORMAL LOW (ref 1.7–2.4)

## 2023-02-13 LAB — CBG MONITORING, ED: Glucose-Capillary: 153 mg/dL — ABNORMAL HIGH (ref 70–99)

## 2023-02-13 MED ORDER — FUROSEMIDE 40 MG PO TABS: 20.0000 mg | ORAL_TABLET | Freq: Every day | ORAL | Status: DC

## 2023-02-13 MED ORDER — MAGNESIUM SULFATE IN D5W 1-5 GM/100ML-% IV SOLN
1.0000 g | Freq: Once | INTRAVENOUS | Status: AC
  Administered 2023-02-13: 1 g via INTRAVENOUS
  Filled 2023-02-13: qty 100

## 2023-02-13 MED ORDER — ACETAMINOPHEN 325 MG PO TABS: 650.0000 mg | ORAL_TABLET | Freq: Four times a day (QID) | ORAL | Status: DC | PRN

## 2023-02-13 MED ORDER — ALBUTEROL SULFATE (2.5 MG/3ML) 0.083% IN NEBU: 2.5000 mg | INHALATION_SOLUTION | RESPIRATORY_TRACT | Status: DC | PRN

## 2023-02-13 MED ORDER — INSULIN ASPART 100 UNIT/ML IJ SOLN
0.0000 [IU] | Freq: Three times a day (TID) | INTRAMUSCULAR | Status: DC
  Administered 2023-02-13: 1 [IU] via SUBCUTANEOUS
  Administered 2023-02-14 (×2): 2 [IU] via SUBCUTANEOUS
  Administered 2023-02-14 – 2023-02-15 (×2): 1 [IU] via SUBCUTANEOUS
  Filled 2023-02-13 (×5): qty 1

## 2023-02-13 MED ORDER — DM-GUAIFENESIN ER 30-600 MG PO TB12: 1.0000 | ORAL_TABLET | Freq: Two times a day (BID) | ORAL | Status: DC | PRN

## 2023-02-13 MED ORDER — PRAVASTATIN SODIUM 20 MG PO TABS
20.0000 mg | ORAL_TABLET | Freq: Every day | ORAL | Status: DC
  Administered 2023-02-14 – 2023-02-15 (×2): 20 mg via ORAL
  Filled 2023-02-13 (×2): qty 1

## 2023-02-13 MED ORDER — INSULIN ASPART 100 UNIT/ML IJ SOLN
0.0000 [IU] | Freq: Every day | INTRAMUSCULAR | Status: DC
  Administered 2023-02-14: 0 [IU] via SUBCUTANEOUS

## 2023-02-13 MED ORDER — ENOXAPARIN SODIUM 120 MG/0.8ML IJ SOSY
120.0000 mg | PREFILLED_SYRINGE | Freq: Two times a day (BID) | INTRAMUSCULAR | Status: DC
  Administered 2023-02-13 – 2023-02-14 (×2): 120 mg via SUBCUTANEOUS
  Filled 2023-02-13 (×3): qty 0.8

## 2023-02-13 MED ORDER — METOPROLOL TARTRATE 5 MG/5ML IV SOLN: 5.0000 mg | INTRAVENOUS | Status: DC | PRN

## 2023-02-13 MED ORDER — METOPROLOL TARTRATE 25 MG PO TABS
12.5000 mg | ORAL_TABLET | Freq: Four times a day (QID) | ORAL | Status: DC
  Administered 2023-02-13 – 2023-02-15 (×7): 12.5 mg via ORAL
  Filled 2023-02-13 (×8): qty 1

## 2023-02-13 MED ORDER — MAGNESIUM CHLORIDE 64 MG PO TBEC
1.0000 | DELAYED_RELEASE_TABLET | Freq: Two times a day (BID) | ORAL | Status: DC
  Administered 2023-02-13 – 2023-02-15 (×4): 64 mg via ORAL
  Filled 2023-02-13 (×4): qty 1

## 2023-02-13 MED ORDER — TRIAMCINOLONE ACETONIDE 0.1 % EX CREA: 1.0000 | TOPICAL_CREAM | Freq: Three times a day (TID) | CUTANEOUS | Status: DC | PRN

## 2023-02-13 MED ORDER — LORAZEPAM 0.5 MG PO TABS: 0.5000 mg | ORAL_TABLET | Freq: Three times a day (TID) | ORAL | Status: DC | PRN

## 2023-02-13 MED ORDER — IOHEXOL 350 MG/ML SOLN
75.0000 mL | Freq: Once | INTRAVENOUS | Status: AC | PRN
  Administered 2023-02-13: 50 mL via INTRAVENOUS

## 2023-02-13 MED ORDER — AMLODIPINE BESYLATE 5 MG PO TABS
10.0000 mg | ORAL_TABLET | Freq: Every day | ORAL | Status: DC
  Filled 2023-02-13: qty 2

## 2023-02-13 MED ORDER — HYDRALAZINE HCL 20 MG/ML IJ SOLN: 5.0000 mg | INTRAMUSCULAR | Status: DC | PRN

## 2023-02-13 MED ORDER — FUROSEMIDE 10 MG/ML IJ SOLN
40.0000 mg | Freq: Two times a day (BID) | INTRAMUSCULAR | Status: DC
  Administered 2023-02-13 – 2023-02-15 (×4): 40 mg via INTRAVENOUS
  Filled 2023-02-13 (×4): qty 4

## 2023-02-13 MED ORDER — POTASSIUM CHLORIDE 20 MEQ PO PACK
40.0000 meq | PACK | ORAL | Status: AC
  Administered 2023-02-13: 40 meq via ORAL
  Filled 2023-02-13: qty 2

## 2023-02-13 MED ORDER — ONDANSETRON HCL 4 MG/2ML IJ SOLN: 4.0000 mg | Freq: Three times a day (TID) | INTRAMUSCULAR | Status: DC | PRN

## 2023-02-13 MED ORDER — OYSTER SHELL CALCIUM/D3 500-5 MG-MCG PO TABS
1.0000 | ORAL_TABLET | Freq: Every day | ORAL | Status: DC
  Administered 2023-02-13 – 2023-02-15 (×3): 1 via ORAL
  Filled 2023-02-13 (×2): qty 1

## 2023-02-13 NOTE — ED Provider Triage Note (Signed)
Emergency Medicine Provider Triage Evaluation Note  Christopher Marsh , a 62 y.o. male   with complaint of tachycardia. Pt is coming from cancer center after abnormal vital signs and EKG. Denies chest pain and SOB at this time. Pt has hx of pneumonia and DVT.  Review of Systems  Positive: dizziness  Negative: Weakness, fever, palpitations  Physical Exam  BP (!) 151/77   Pulse (!) 134   Temp 97.7 F (36.5 C)   Resp 20   SpO2 99%  Gen:   Awake, no distress  Alert and able to answer questions in triage room   Resp:  Normal effort lungs clear bilaterally  MSK:   Moves extremities without difficulty  Other:  Tachycardia on auscultation. Peripheral pulses 2+, irregular  rhythm bilaterally on radial pulse.  Medical Decision Making  Medically screening exam initiated at 9:51 AM.  Appropriate orders placed.  Naftali Carchi Kresse was informed that the remainder of the evaluation will be completed by another provider, this initial triage assessment does not replace that evaluation, and the importance of remaining in the ED until their evaluation is complete.     Romeo Apple, Darden Flemister A, PA-C 02/13/23 1007

## 2023-02-13 NOTE — Assessment & Plan Note (Addendum)
#  Recurrent lower extremity DVT, secondary to antiphospholipid syndrome.-.  Persistent lupus anticoagulant positive.Recommend life time anticoagulation Patient was previously on Coumadin now on Lovenox. Recent US reviewed. Bilateral lower extremity DVT is likely due to non therapeutic dose of Lovenox.[Self decreased] Now he is back on Lovenox 1 mg/kg twice daily, continue.

## 2023-02-13 NOTE — Assessment & Plan Note (Addendum)
EKG was performed in the clinic, reviewed by me. A Fib w RVR Recommend patient to go to ER for further evaluation.  Discussed with ER physician. I agree repeating CTA chest, cardiology evaluation.

## 2023-02-13 NOTE — Consult Note (Signed)
Cardiology Consultation   Patient ID: Christopher Marsh MRN: [id number]; DOB: [birth year]  Admit date: 02/13/2023 Date of Consult: 02/13/2023  PCP:  Nelwyn Salisbury, MD   [facility] Providers Cardiologist:  Maisie Fus, MD        Patient Profile:   Christopher Marsh is a 63 y.o. male with a hx of arthritis, type II diabetes, DVT on chronic anticoagulant with Lovenox, dyspnea, antiphospholipid syndrome, hypertension, bilateral lower extremity edema, multinodular thyroid, metastatic cholangiocarcinoma, who is being seen 02/13/2023 for the evaluation of new onset atrial flutter at the request of Dr. Clyde Lundborg.  History of Present Illness:   Christopher Marsh is a 62 year old male with previously mentioned past medical history.  He was last hospitalized at [facility] 09/15/2022 - 09/17/2022, where he had presented with complaints of shortness of breath on exertion, had a history of pneumonia approximately a month prior, and chest x-ray on presentation showed multifocal pneumonia.  CT angio of the chest showed progression of bilateral multifocal patchy groundglass and interstitial opacities, left greater than right, concerning for multifocal pneumonia.  Patient's hospital course remained uneventful.  He was started on broad-spectrum antibiotics with significant improvement.  Currently was not requiring oxygen therapy maintaining saturations on room air.  He is considered medically stable for discharge with oral antibiotics 09/17/2022.  Patient also previously reported history of lower extremity DVT in 2002 and was initiated on Lovenox and then bridged to Coumadin therapy.  Patient is taking warfarin for 2 years before anticoagulation was stopped.  In 12/03/2021 the patient presented to the emergency room for evaluation of right lower extremity pain and swelling for about a week.  It started on the inside of the right thigh and migrated to the right calf.  Right lower extremity ultrasound showed occlusive DVT  extending from the mid aspect of the femoral vein through the tibial vein.  He was started on Xarelto.  Establish care with vascular surgery in 11/2021 and the decision was made not to proceed with an embolectomy and continue anticoagulation.  He was then referred to hematology oncology for further evaluation.  Patient is continued on systemic chemotherapy with cisplatin/gemcitabine/durvalumab as well as receiving XRT to bone lesions.  He had presented for blood work outpatient with his oncologist and was sent to the emergency department because of concern for elevated heart rate.  He reported to the emergency department with reports of shortness of breath for the last day or 2.  He has continued swelling to both of his bilateral lower extremities.  He has known DVTs in both lower extremities and currently was on Lovenox twice daily.  Unfortunately he stated that he developed a nosebleed and decrease his Lovenox to once daily for quite some time on his own but has increased his dosing back to twice daily for the last couple of days.  He reports that he was just recently started on furosemide approximately 3 days prior when he saw his pulmonologist.  He currently denies any chest pain, palpitations, lightheadedness or dizziness.  Does have shortness of breath and swelling to his bilateral lower extremities.  Denies any previous cardiac history.  Initial vitals: Blood pressure 151/77, pulse 134, respirations 20, temperature 97.7  Pertinent labs: BNP 119.8, TSH 0.269, high-sensitivity troponin of 11, WBCs of 3.7, hemoglobin 8.5, hematocrit 27.0, potassium 3.4, blood glucose of 113, BUN is 7, serum creatinine 0.8, calcium 8.3, Mg 1.5  Imaging: CT angio of the chest shows no acute pulmonary embolism, cardiomegaly with coronary  artery calcifications, bilateral pleural effusion,  Medications administered in the emergency department: Potassium chloride and magnesium IVPB  Cardiology was consulted for new onset  atrial flutter noted on EKG  Past Medical History:  Diagnosis Date   Antiphospholipid antibody syndrome    Arthritis    Cancer    Diabetes mellitus without complication    DVT (deep venous thrombosis)    Lt leg   Dyspnea    Elevated lipids    Erythrocytosis    Hyperlipidemia    Hypertension    Lower extremity edema    Multinodular thyroid    Post-thrombotic syndrome     Past Surgical History:  Procedure Laterality Date   COLONOSCOPY WITH PROPOFOL N/A 09/23/2018   Per Dr. Norma Fredrickson, polyp, repeat in 3 yrs    EYE MUSCLE SURGERY     JOINT REPLACEMENT Left 07/22/2017   PORTA CATH INSERTION N/A 12/24/2022   Procedure: PORTA CATH INSERTION;  Surgeon: Annice Needy, MD;  Location: [facility] INVASIVE CV LAB;  Service: Cardiovascular;  Laterality: N/A;   TOTAL HIP ARTHROPLASTY Left 07/22/2017   Procedure: TOTAL HIP ARTHROPLASTY;  Surgeon: Donato Heinz, MD;  Location: [facility] ORS;  Service: Orthopedics;  Laterality: Left;     Home Medications:  Prior to Admission medications   Medication Sig Start Date End Date Taking? Authorizing Provider  albuterol (VENTOLIN HFA) 108 (90 Base) MCG/ACT inhaler TAKE 2 PUFFS BY MOUTH EVERY 4 HOURS AS NEEDED 01/02/23  Yes Nelwyn Salisbury, MD  amLODipine (NORVASC) 10 MG tablet Take 1 tablet (10 mg total) by mouth daily. 09/18/22  Yes Burnadette Pop, MD  Calcium Carb-Cholecalciferol (CALCIUM 500 + D PO) Take 1 tablet by mouth daily.   Yes [provider]  dexamethasone (DECADRON) 4 MG tablet TAKE 2 TABLETS DAILY X 3 DAYS STARTING THE DAY AFTER CISPLATIN CHEMOTHERAPY. TAKE WITH FOOD. 12/14/22  Yes [name], MD  enoxaparin (LOVENOX) 120 MG/0.8ML injection INJECT 0.8 MLS (120 MG TOTAL) INTO THE SKIN EVERY 12 (TWELVE) HOURS FOR 28 DAYS. 01/11/23  Yes [name], MD  furosemide (LASIX) 20 MG tablet Take 1 tablet (20 mg total) by mouth daily as needed for edema. 02/11/23  Yes Borders, Daryl Eastern, NP  glipiZIDE (GLUCOTROL) 10 MG tablet Take 1 tablet (10 mg total) by mouth 2  (two) times daily before a meal. Patient taking differently: Take 10 mg by mouth daily before breakfast. 08/08/22  Yes Nelwyn Salisbury, MD  lidocaine-prilocaine (EMLA) cream Apply 1 Application topically as needed. 12/24/22  Yes [name], MD  LORazepam (ATIVAN) 0.5 MG tablet TAKE 1 TABLET (0.5 MG TOTAL) BY MOUTH EVERY 8 (EIGHT) HOURS AS NEEDED FOR ANXIETY (NAUSEA VOMITING). 01/29/23  Yes [name], MD  magnesium chloride (SLOW-MAG) 64 MG TBEC SR tablet Take 1 tablet (64 mg total) by mouth daily. Patient taking differently: Take 1 tablet by mouth 2 (two) times daily. 12/20/22  Yes [name], MD  metFORMIN (GLUCOPHAGE) 500 MG tablet TAKE TWO TABLETS BY MOUTH EVERY MORNING WITH BREAKFAST 10/25/22  Yes Nelwyn Salisbury, MD  MUCINEX D MAX STRENGTH [phone] MG TB12 Take 1,200 mg by mouth 2 (two) times daily as needed (for congestion or to loosen mucous in the chest).   Yes [provider]  ondansetron (ZOFRAN) 8 MG tablet Take 1 tablet (8 mg total) by mouth every 8 (eight) hours as needed for nausea or vomiting. Start on the third day after cisplatin. 01/02/23  Yes [name], MD  pravastatin (PRAVACHOL) 20 MG tablet Take  1 tablet (20 mg total) by mouth daily. 08/08/22  Yes Nelwyn Salisbury, MD  prochlorperazine (COMPAZINE) 10 MG tablet TAKE 1 TABLET (10 MG TOTAL) BY MOUTH EVERY 6 HOURS AS NEEDED FOR NAUSEA AND VOMITING 01/02/23  Yes [name], MD  tadalafil (CIALIS) 20 MG tablet Take 0.5-1 tablets (10-20 mg total) by mouth every other day as needed for erectile dysfunction. 08/08/22  Yes Nelwyn Salisbury, MD  triamcinolone cream (KENALOG) 0.1 % Apply 1 Application topically 3 (three) times daily. Patient taking differently: Apply 1 Application topically 3 (three) times daily as needed (for bilateral, below-the-knees skin irritation). 07/03/22  Yes Nelwyn Salisbury, MD  glucose blood test strip 1 each by Other route as needed for other. accu chek aviva plusUse as instructed    [provider]  levofloxacin  (LEVAQUIN) 750 MG tablet Take 1 tablet (750 mg total) by mouth daily. Patient not taking: Reported on 02/11/2023 02/01/23   Bevelyn Ngo, NP    Inpatient Medications: Scheduled Meds:  insulin aspart  0-5 Units Subcutaneous QHS   insulin aspart  0-9 Units Subcutaneous TID WC   metoprolol tartrate  12.5 mg Oral Q6H   Continuous Infusions:  magnesium sulfate bolus IVPB     PRN Meds: acetaminophen, albuterol, dextromethorphan-guaiFENesin, hydrALAZINE, metoprolol tartrate, ondansetron (ZOFRAN) IV  Allergies:   No Known Allergies  Social History:   Social History   Socioeconomic History   Marital status: Married    Spouse name: Not on file   Number of children: Not on file   Years of education: Not on file   Highest education level: Not on file  Occupational History   Not on file  Tobacco Use   Smoking status: Former    Packs/day: 0.50    Years: 6.00    Additional pack years: 0.00    Total pack years: 3.00    Types: Cigarettes    Quit date: 28    Years since quitting: 42.3   Smokeless tobacco: Never   Tobacco comments:    Pt states when he did smoke he smoked at most 1 ppd. ALS 09/26/2022  Vaping Use   Vaping Use: Never used  Substance and Sexual Activity   Alcohol use: Yes    Comment: occ   Drug use: No   Sexual activity: Not on file  Other Topics Concern   Not on file  Social History Narrative   Not on file   Social Determinants of Health   Financial Resource Strain: Low Risk  (08/27/2022)   Overall Financial Resource Strain (CARDIA)    Difficulty of Paying Living Expenses: Not very hard  Food Insecurity: No Food Insecurity (09/15/2022)   Hunger Vital Sign    Worried About Running Out of Food in the Last Year: Never true    Ran Out of Food in the Last Year: Never true  Transportation Needs: No Transportation Needs (09/15/2022)   PRAPARE - Administrator, Civil Service (Medical): No    Lack of Transportation (Non-Medical): No  Physical  Activity: Insufficiently Active (08/27/2022)   Exercise Vital Sign    Days of Exercise per Week: 3 days    Minutes of Exercise per Session: 30 min  Stress: Stress Concern Present (08/27/2022)   [employer] of Occupational Health - Occupational Stress Questionnaire    Feeling of Stress : Rather much  Social Connections: Socially Integrated (08/27/2022)   Social Connection and Isolation Panel [NHANES]    Frequency of Communication with Friends and  Family: More than three times a week    Frequency of Social Gatherings with Friends and Family: More than three times a week    Attends Religious Services: 1 to 4 times per year    Active Member of [employer] or Organizations: Yes    Attends Banker Meetings: Never    Marital Status: Married  Catering manager Violence: Not At Risk (09/15/2022)   Humiliation, Afraid, Rape, and Kick questionnaire    Fear of Current or Ex-Partner: No    Emotionally Abused: No    Physically Abused: No    Sexually Abused: No    Family History:    Family History  Problem Relation Age of Onset   Breast cancer Mother    Emphysema Father    Colon cancer Maternal Grandmother      ROS:  Please see the history of present illness.  Review of Systems  Constitutional:  Positive for malaise/fatigue.  HENT:  Positive for nosebleeds.   Respiratory:  Positive for shortness of breath.   Cardiovascular:  Positive for leg swelling.  Gastrointestinal:  Positive for abdominal pain.  Genitourinary:  Positive for frequency.  Neurological:  Positive for weakness.    All other ROS reviewed and negative.     Physical Exam/Data:   Vitals:   02/13/23 0944 02/13/23 1208 02/13/23 1400  BP: (!) 151/77 (!) 149/81 (!) 163/77  Pulse: (!) 134 (!) 102 95  Resp: 20 (!) 21 16  Temp: 97.7 F (36.5 C)    SpO2: 99% 99% 100%   No intake or output data in the 24 hours ending 02/13/23 1612    02/13/2023    8:29 AM 02/11/2023   11:30 AM 02/04/2023    1:46 PM  Last 3  Weights  Weight (lbs) 309 lb 1.6 oz 308 lb 1.6 oz 311 lb 3.2 oz  Weight (kg) 140.207 kg 139.753 kg 141.159 kg     There is no height or weight on file to calculate BMI.  General:  Well nourished, well developed, in no acute distress HEENT: normal Neck: no JVD Vascular: No carotid bruits; Distal pulses 2+ bilaterally Cardiac:  normal S1, S2; R IR; tachycardic, no murmur  Lungs:  clear to auscultation bilaterally, no wheezing, rhonchi or rales, respirations are unlabored at rest on room air Abd: soft, nontender, obese, no hepatomegaly  Ext: 3+ edema Musculoskeletal:  No deformities, BUE and BLE strength normal and equal Skin: warm and dry, implanted port that is accessed to the right upper chest Neuro:  CNs 2-12 intact, no focal abnormalities noted Psych:  Normal affect   EKG:  The EKG was personally reviewed and demonstrates: Atrial flutter with a rate of 123 with PVC or aberrancy Telemetry:  Telemetry was personally reviewed and demonstrates: Atrial flutter with variable AV block rates of 90s to 140  Relevant CV Studies: TTE 08/19/22 1. Left ventricular ejection fraction, by estimation, is 60 to 65%. The  left ventricle has normal function. The left ventricle has no regional  wall motion abnormalities. Left ventricular diastolic parameters were  normal.   2. Right ventricular systolic function is normal. The right ventricular  size is mildly enlarged. Tricuspid regurgitation signal is inadequate for  assessing PA pressure.   3. No valvular stenoses or regurgitation noted.   Laboratory Data:  High Sensitivity Troponin:   Recent Labs  Lab 02/13/23 1010  TROPONINIHS 11     Chemistry Recent Labs  Lab 02/11/23 1129 02/13/23 0808  NA 136 137  K 3.6  3.4*  CL 103 106  CO2 22 23  GLUCOSE 107* 113*  BUN 7* 7*  CREATININE 0.84 0.89  CALCIUM 8.7* 8.3*  MG 1.6* 1.5*  GFRNONAA >60 >60  ANIONGAP 11 8    Recent Labs  Lab 02/11/23 1129 02/13/23 0808  PROT 6.2* 6.0*   ALBUMIN 3.4* 3.5  AST 26 24  ALT 17 16  ALKPHOS 50 52  BILITOT 0.6 0.4   Lipids No results for input(s): "CHOL", "TRIG", "HDL", "LABVLDL", "LDLCALC", "CHOLHDL" in the last 168 hours.  Hematology Recent Labs  Lab 02/11/23 1130 02/13/23 0808  WBC 2.4* 3.7*  RBC 2.63* 2.60*  HGB 8.6* 8.5*  HCT 27.0* 27.0*  MCV 102.7* 103.8*  MCH 32.7 32.7  MCHC 31.9 31.5  RDW 16.0* 15.9*  PLT 88* 186   Thyroid  Recent Labs  Lab 02/13/23 1010  TSH 0.269*    BNP Recent Labs  Lab 02/11/23 1129 02/13/23 1101  BNP 180.2* 119.8*    DDimer No results for input(s): "DDIMER" in the last 168 hours.   Radiology/Studies:  CT Angio Chest PE W and/or Wo Contrast  Result Date: 02/13/2023 CLINICAL DATA:  Pulmonary embolism suspected. High probability DVT. Dyspnea. EXAM: CT ANGIOGRAPHY CHEST WITH CONTRAST TECHNIQUE: Multidetector CT imaging of the chest was performed using the standard protocol during bolus administration of intravenous contrast. Multiplanar CT image reconstructions and MIPs were obtained to evaluate the vascular anatomy. RADIATION DOSE REDUCTION: This exam was performed according to the departmental dose-optimization program which includes automated exposure control, adjustment of the mA and/or kV according to patient size and/or use of iterative reconstruction technique. CONTRAST:  50mL OMNIPAQUE IOHEXOL 350 MG/ML SOLN COMPARISON:  02/01/2023 FINDINGS: Cardiovascular: Heart is mildly enlarged. No pericardial effusion. There are coronary artery calcifications. The pulmonary arteries are moderately well opacified and there is no acute pulmonary embolus. RIGHT-sided port tip extends to the level of the LOWER superior vena cava/UPPER RIGHT atrium. Mediastinum/Nodes: The LEFT lobe of the thyroid gland is enlarged and nodular, consistent with goiter. The appearance is unchanged. Esophagus is unremarkable. There are scattered sub centimeter mediastinal and axillary lymph nodes. Lungs/Pleura:  Airways are patent. There are bilateral pleural effusions. LEFT LOWER lobe airspace filling and atelectasis are noted, slightly improved compared to prior study. There has been improvement in RIGHT UPPER and LOWER lobe aeration compared to prior study. Upper Abdomen: There is marked heterogeneous appearance of the liver. The mass previously noted in the RIGHT hepatic lobe is estimated to measure 7.7 centimeters. Cirrhotic morphology of the liver. There is adenopathy in the gastrohepatic ligament, largest node measuring 1 6 centimeters in short axis. Musculoskeletal: There are degenerative changes in the spine. A sclerotic lesion in the UPPER sternum is 7 millimeters, suspicious for metastatic disease. Review of the MIP images confirms the above findings. IMPRESSION: 1. Technically adequate exam showing no acute pulmonary embolus. 2. Cardiomegaly. Coronary artery calcifications. 3. Bilateral pleural effusions. 4. Improved aeration of the RIGHT UPPER and LOWER lobes compared to prior study. 5. LEFT LOWER lobe and lingula airspace filling and atelectasis, slightly improved compared to prior study. 6. Cirrhotic morphology of the liver. 7.7 centimeters mass in the RIGHT hepatic lobe. 7. Gastrohepatic ligament adenopathy. 8. A 7 millimeters sclerotic lesion in the UPPER sternum is suspicious for metastatic disease. 9. LEFT thyroid goiter. Electronically Signed   By: Norva Pavlov M.D.   On: 02/13/2023 12:09   DG Chest 2 View  Result Date: 02/11/2023 CLINICAL DATA:  SOB EXAM: CHEST - 2 VIEW  COMPARISON:  09/15/2022 FINDINGS: Hyperinflated lungs consistent with COPD. Enlarged cardiac silhouette. Bibasilar minimal consolidation volume loss. Bilateral small pleural effusions. Thoracic degenerative changes. Right-sided Port-A-Cath tip distal SVC. IMPRESSION: Enlarged cardiac silhouette. Hyperinflated lungs consistent with COPD. Small pleural effusions and minimal bibasilar consolidation. Electronically Signed   By:  Layla Maw M.D.   On: 02/11/2023 15:22   US Venous Img Lower Bilateral  Result Date: 02/11/2023 CLINICAL DATA:  History of DVT, bilateral lower extremity edema EXAM: BILATERAL LOWER EXTREMITY VENOUS DOPPLER ULTRASOUND TECHNIQUE: Gray-scale sonography with compression, as well as color and duplex ultrasound, were performed to evaluate the deep venous system(s) from the level of the common femoral vein through the popliteal and proximal calf veins. COMPARISON:  None Available. FINDINGS: VENOUS Normal compressibility of the common femoral, and superficial femoral veins. There is nonocclusive thrombus identified within the popliteal veins bilaterally. The posterior tibial veins of the right catheter largely occluded. Left posterior tibial veins are patent. Visualized portions of profunda femoral vein and great saphenous vein unremarkable. Doppler waveforms show normal direction of venous flow, normal respiratory plasticity and response to augmentation. OTHER None. Limitations: none IMPRESSION: 1. Nonocclusive DVT within the popliteal veins bilaterally. 2. Occlusive DVT within the posterior tibial veins of the right calf. 3. These results will be called to the ordering clinician or representative by the Radiologist Assistant, and communication documented in the PACS or [employer]. Electronically Signed   By: Helyn Numbers M.D.   On: 02/11/2023 15:18     Assessment and Plan:   New onset atrial flutter -Remains in atrial flutter on telemetry monitoring -Has been continued on full dose anticoagulation with Lovenox injections twice daily -Previously had been on xarelto and warfarin -Started on metoprolol to tartrate 12.5 mg every 6 hours for better rate control -If unable to achieve adequate rate control, consideration for elective cardioversion -Continue on cardiac monitor  Shortness of breath -Has been ongoing for the last several days -CT of the chest negative for PE but shows pleural  effusions -He recently followed up outpatient with his pulmonologist who started him on oral furosemide 20 mg daily -Continue furosemide -Echocardiogram ordered and pending with further recommendations to follow -Continue oxygen saturations without oxygen therapy -BNP 119.8 -Started on furosemide 40 mg IV twice daily -Daily BMP while on diuretic therapy -Monitor urine output  Chemotherapy-induced anemia -Hemoglobin 8.5 -Recommend transfusion for anything less than 8 -Daily CBC  Bilateral DVT -Continued on Lovenox subcu twice daily -CTA of the chest negative for PE  Essential hypertension -Blood pressure 163/77 -Started on metoprolol 12.5 every 6 hours and furosemide -PTA amlodipine 10 mg daily is on hold -Vital signs per unit protocol  Hyperlipidemia -Continue on statin therapy  Type 2 diabetes -Management per IM  Hypomagnesia, mild hypokalemia -Magnesium and potassium found to be subtherapeutic -Serum magnesium level of 1.5 received 1 g of magnesium IVPB -Serum potassium 3.4 given 40 mEq of potassium chloride -Daily BMP -Monitor/trend/replete electrolytes as needed  Coronary artery calcifications noted on CT of the chest -No prior cardiac history -Continue on statin therapy -Continued on Lovenox and low-dose aspirin  Obesity -BMI 38.63 -Recommend continue to work on weight loss with dietary lifestyle modification  Cholangiocarcinoma with bone lesions -Continued management by oncology   Risk Assessment/Risk Scores:        New York Heart Association (NYHA) Functional Class NYHA Class II  CHA2DS2-VASc Score = 3   This indicates a 3.2% annual risk of stroke. The patient's score is based upon: CHF  History: 0 HTN History: 1 Diabetes History: 1 Stroke History: 0 Vascular Disease History: 1 Age Score: 0 Gender Score: 0         For questions or updates, please contact [facility] Please consult [email or web address] for contact info under     Signed, Anderia Lorenzo, NP  02/13/2023 4:12 PM

## 2023-02-13 NOTE — Assessment & Plan Note (Addendum)
Follow up with radiation oncology for palliative radiation to bone lesions.  Consider bisphosphonate after dental clearance is obtained. 

## 2023-02-13 NOTE — Assessment & Plan Note (Signed)
Hemoglobin trended down.  Close monitor weekly.  Possible need of blood transfusion if hemoglobin drops below 8. 

## 2023-02-13 NOTE — Assessment & Plan Note (Signed)
-   Continue calcium supplementation. 

## 2023-02-13 NOTE — ED Provider Notes (Signed)
[facility] Provider Note    Event Date/Time   First MD Initiated Contact with Patient 02/13/23 1037     (approximate)   History   Tachycardia   HPI  Christopher Marsh is a 62 y.o. male  per oncology on 4/15 'antiphospholipid syndrome with history of recurrent DVTs on Lovenox, stage IV cholangiocarcinoma with bone metastasis.  Patient is on systemic chemotherapy with cisplatin/gemcitabine/Durvalumab.  Additionally, he is receiving XRT to bone lesions '  Patient reports he went to chemotherapy for follow-up today, and saw his oncologist.  She sent him to the ER because of concern for elevated heart rate.    He reports some shortness of breath for the last day or 2.  Ongoing swelling in both of his legs.  He has known DVTs in both legs currently on Lovenox twice daily injections.  He denies chest pain.  He reports he did not really notice anything unusual except for the amount of swelling and a slight shortness of breath, and reports the swelling is been present he started Lasix 3 days ago, saw his pulmonologist, had ultrasound showing blood clots.  Now oncologist is recommended come to the ER for further workup  Physical Exam   Triage Vital Signs: ED Triage Vitals [02/13/23 0944]  Enc Vitals Group     BP (!) 151/77     Pulse Rate (!) 134     Resp 20     Temp 97.7 F (36.5 C)     Temp src      SpO2 99 %     Weight      Height      Head Circumference      Peak Flow      Pain Score      Pain Loc      Pain Edu?      Excl. in GC?     Most recent vital signs: Vitals:   02/13/23 1208 02/13/23 1400  BP: (!) 149/81 (!) 163/77  Pulse: (!) 102 95  Resp: (!) 21 16  Temp:    SpO2: 99% 100%     General: Awake, no distress.  Very pleasant.  Requesting to sit up straighter CV:  Good peripheral perfusion.  Moderate to severe bilateral lower extremity edema to about the level of the knees.  Some venous congestion findings noted over the left posterior  calf.  Heart rate is somewhat irregular, presently about 90 bpm.  On telemetry appears to show occasional flutter waves Resp:  Normal effort.  Diminished in the bases bilaterally.  Questionably some very faint wheezing in the bases.  Upper lobes are clear. Abd:  No distention.  Other:  Right upper chest port accessed   ED Results / Procedures / Treatments   Labs (all labs ordered are listed, but only abnormal results are displayed) Labs Reviewed  BRAIN NATRIURETIC PEPTIDE - Abnormal; Notable for the following components:      Result Value   B Natriuretic Peptide 119.8 (*)    All other components within normal limits  TSH - Abnormal; Notable for the following components:   TSH 0.269 (*)    All other components within normal limits  PHOSPHORUS  TROPONIN I (HIGH SENSITIVITY)     EKG  Reviewed inter by me at 940 heart rate 120 QRS 100 QTc 440 Atrial flutter with intermittent conduction rate approximately 120.  Variable conduction.  No obvious ischemic abnormality noted.   RADIOLOGY  CT Angio Chest PE W and/or  Wo Contrast  Result Date: 02/13/2023 CLINICAL DATA:  Pulmonary embolism suspected. High probability DVT. Dyspnea. EXAM: CT ANGIOGRAPHY CHEST WITH CONTRAST TECHNIQUE: Multidetector CT imaging of the chest was performed using the standard protocol during bolus administration of intravenous contrast. Multiplanar CT image reconstructions and MIPs were obtained to evaluate the vascular anatomy. RADIATION DOSE REDUCTION: This exam was performed according to the departmental dose-optimization program which includes automated exposure control, adjustment of the mA and/or kV according to patient size and/or use of iterative reconstruction technique. CONTRAST:  50mL OMNIPAQUE IOHEXOL 350 MG/ML SOLN COMPARISON:  02/01/2023 FINDINGS: Cardiovascular: Heart is mildly enlarged. No pericardial effusion. There are coronary artery calcifications. The pulmonary arteries are moderately well opacified  and there is no acute pulmonary embolus. RIGHT-sided port tip extends to the level of the LOWER superior vena cava/UPPER RIGHT atrium. Mediastinum/Nodes: The LEFT lobe of the thyroid gland is enlarged and nodular, consistent with goiter. The appearance is unchanged. Esophagus is unremarkable. There are scattered sub centimeter mediastinal and axillary lymph nodes. Lungs/Pleura: Airways are patent. There are bilateral pleural effusions. LEFT LOWER lobe airspace filling and atelectasis are noted, slightly improved compared to prior study. There has been improvement in RIGHT UPPER and LOWER lobe aeration compared to prior study. Upper Abdomen: There is marked heterogeneous appearance of the liver. The mass previously noted in the RIGHT hepatic lobe is estimated to measure 7.7 centimeters. Cirrhotic morphology of the liver. There is adenopathy in the gastrohepatic ligament, largest node measuring 1 6 centimeters in short axis. Musculoskeletal: There are degenerative changes in the spine. A sclerotic lesion in the UPPER sternum is 7 millimeters, suspicious for metastatic disease. Review of the MIP images confirms the above findings. IMPRESSION: 1. Technically adequate exam showing no acute pulmonary embolus. 2. Cardiomegaly. Coronary artery calcifications. 3. Bilateral pleural effusions. 4. Improved aeration of the RIGHT UPPER and LOWER lobes compared to prior study. 5. LEFT LOWER lobe and lingula airspace filling and atelectasis, slightly improved compared to prior study. 6. Cirrhotic morphology of the liver. 7.7 centimeters mass in the RIGHT hepatic lobe. 7. Gastrohepatic ligament adenopathy. 8. A 7 millimeters sclerotic lesion in the UPPER sternum is suspicious for metastatic disease. 9. LEFT thyroid goiter. Electronically Signed   By: Norva Pavlov M.D.   On: 02/13/2023 12:09      PROCEDURES:  Critical Care performed: No  Procedures   MEDICATIONS ORDERED IN ED: Medications  magnesium sulfate IVPB 1 g  100 mL (has no administration in time range)  ondansetron (ZOFRAN) injection 4 mg (has no administration in time range)  hydrALAZINE (APRESOLINE) injection 5 mg (has no administration in time range)  acetaminophen (TYLENOL) tablet 650 mg (has no administration in time range)  albuterol (PROVENTIL) (2.5 MG/3ML) 0.083% nebulizer solution 2.5 mg (has no administration in time range)  dextromethorphan-guaiFENesin (MUCINEX DM) 30-600 MG per 12 hr tablet 1 tablet (has no administration in time range)  insulin aspart (novoLOG) injection 0-5 Units (has no administration in time range)  insulin aspart (novoLOG) injection 0-9 Units (has no administration in time range)  metoprolol tartrate (LOPRESSOR) injection 5 mg (has no administration in time range)  metoprolol tartrate (LOPRESSOR) tablet 12.5 mg (has no administration in time range)  magnesium sulfate IVPB 1 g 100 mL (0 g Intravenous Stopped 02/13/23 1532)  potassium chloride (KLOR-CON) packet 40 mEq (40 mEq Oral Given 02/13/23 1100)  iohexol (OMNIPAQUE) 350 MG/ML injection 75 mL (50 mLs Intravenous Contrast Given 02/13/23 1130)     IMPRESSION / MDM / ASSESSMENT AND  PLAN / ED COURSE  I reviewed the triage vital signs and the nursing notes.                              Differential diagnosis includes, but is not limited to, volume overload, congestive heart failure, pulmonary embolism, volume overload, electrolyte abnormality, medication reaction etc.  The differential diagnosis quite broad but it does appear the patient has increasing dyspnea, significant lower extremity edema, and despite being on Lasix continues to report increased swelling and weight gain.  He does not have any acute respiratory distress.  His previous chest x-ray is noted to have small pleural effusions, and I question if this may be progressive in nature today.  Given the associated DVTs I have ordered a CT to exclude pulmonary embolism.  He is compliant with Lovenox  anticoagulation at this time twice daily.  He has no chest pain.  His EKG shows new onset atrial dysrhythmia which I suspect is atrial flutter.  He is supposed to have an echo done in May, at this point though given his symptomatology I am concerned he is developing signs or symptoms consistent with possible CHF or other pulmonary edema, this could represent an acute cardiopulmonary process.  Discussed with patient I suspect will require admission.  However wish to exclude PE in the interim.  Troponin is normal.  Labs interpreted as relatively stable anemia, leukopenia.  No infectious symptoms no fever.  Chemistry shows hypomagnesemia and mild hypokalemia which we will replete.  Patient's presentation is most consistent with acute complicated illness / injury requiring diagnostic workup.  The patient is on the cardiac monitor to evaluate for evidence of arrhythmia and/or significant heart rate changes.  Labs notable for somewhat now chronic neutropenia on chemotherapy as well as anemia.  Mildly elevated BNP.  Normal troponin.  Patient accepted to the hospital service under the care of Dr. Clyde Lundborg after consulting with him.  Also discussed with her oncologist Dr.Yu. Both understanding agreeable with and Dr. Cathie Hoops recommending patient be admitted for further workup due to the nature of new onset flutter with rapid ventricular response.  Additionally, believe patient would benefit from an echocardiogram and further workup possible cardiology consult.  Defer to hospitalist service and oncology for further care  Patient is wife both understand agreeable with plan for admission       FINAL CLINICAL IMPRESSION(S) / ED DIAGNOSES   Final diagnoses:  Atrial flutter with rapid ventricular response  Pleural effusion  Deep vein thrombosis (DVT) of proximal vein of both lower extremities, unspecified chronicity  Dyspnea, unspecified type     Rx / DC Orders   ED Discharge Orders     None         Note:  This document was prepared using Dragon voice recognition software and may include unintentional dictation errors.   Sharyn Creamer, MD 02/13/23 [phone]

## 2023-02-13 NOTE — H&P (Signed)
History and Physical    [name]:[id number] DOB: June 18, 1961 DOA: 02/13/2023  Referring MD/NP/PA:   PCP: Nelwyn Salisbury, MD   Patient coming from:  The patient is coming from home.  Chief Complaint: Tachycardia and SOB  HPI: Christopher Marsh is a 62 y.o. male with medical history significant of antiphospholipid syndrome with recurrent DVTs on full dose of Lovenox, stage IV cholangiocarcinoma with bone metastasis on chemotherapy and with XRT to bone lesions, HTN, HLD, DM, who presents with tachycardia and SOB  Patient had follow-up with his oncologist, Dr. Cathie Hoops today and found to have tachycardia. EKG done in office showed A flutter with variable A-V block.  Heart rate up to 140.  Patient was sent to ED for further evaluation and treatment.  Patient states that he has SOB, but no chest pain, palpitation or heart racing.  No cough, fever or chills.  Denies nausea, vomiting, diarrhea or abdominal pain.  No symptoms of UTI.  Patient has severe bilateral lower leg edema.  He has 14 pounds of weight gain in the past 2 weeks.  Of note, pt has known DVTs in both legs currently on Lovenox twice daily injections.    Data reviewed independently and ED Course: pt was found to have BNP 119.8, WBC 3.7, hemoglobin 8.5 (8.6 on 02/10/2023, and 15.4 on 11/08/2022), GFR> 60, troponin level 11, potassium 3.4, temperature normal, blood pressure 149/81, RR 21, oxygen saturation 99% on room air.  Patient is admitted to PCU as inpatient.  Dr. Cathie Hoops of oncology and Dr. Okey Dupre of cardiology are consulted.  CTA: 1. Technically adequate exam showing no acute pulmonary embolus. 2. Cardiomegaly. Coronary artery calcifications. 3. Bilateral pleural effusions. 4. Improved aeration of the RIGHT UPPER and LOWER lobes compared to prior study. 5. LEFT LOWER lobe and lingula airspace filling and atelectasis, slightly improved compared to prior study. 6. Cirrhotic morphology of the liver. 7.7 centimeters mass in the RIGHT  hepatic lobe. 7. Gastrohepatic ligament adenopathy. 8. A 7 millimeters sclerotic lesion in the UPPER sternum is suspicious for metastatic disease. 9. LEFT thyroid goiter.    EKG: I have personally reviewed. Atrial flutter with variable AV block, QTc 432 heart rate 123, low voltage, Q-wave in lead III, poor R wave progression, occasional PVC.   Review of Systems:   General: no fevers, chills, no body weight gain, has fatigue HEENT: no blurry vision, hearing changes or sore throat Respiratory: has dyspnea, no coughing, wheezing CV: no chest pain, no palpitations GI: no nausea, vomiting, abdominal pain, diarrhea, constipation GU: no dysuria, burning on urination, increased urinary frequency, hematuria  Ext: has leg edema Neuro: no unilateral weakness, numbness, or tingling, no vision change or hearing loss Skin: no rash, no skin tear. MSK: No muscle spasm, no deformity, no limitation of range of movement in spin Heme: No easy bruising.  Travel history: No recent long distant travel.   Allergy: No Known Allergies  Past Medical History:  Diagnosis Date   Antiphospholipid antibody syndrome    Arthritis    Cancer    Diabetes mellitus without complication    DVT (deep venous thrombosis)    Lt leg   Dyspnea    Elevated lipids    Erythrocytosis    Hyperlipidemia    Hypertension    Lower extremity edema    Multinodular thyroid    Post-thrombotic syndrome     Past Surgical History:  Procedure Laterality Date   COLONOSCOPY WITH PROPOFOL N/A 09/23/2018   Per Dr. Norma Fredrickson,  polyp, repeat in 3 yrs    EYE MUSCLE SURGERY     JOINT REPLACEMENT Left 07/22/2017   PORTA CATH INSERTION N/A 12/24/2022   Procedure: PORTA CATH INSERTION;  Surgeon: Annice Needy, MD;  Location: [facility];  Service: Cardiovascular;  Laterality: N/A;   TOTAL HIP ARTHROPLASTY Left 07/22/2017   Procedure: TOTAL HIP ARTHROPLASTY;  Surgeon: Donato Heinz, MD;  Location: [facility];  Service: Orthopedics;   Laterality: Left;    Social History:  reports that he quit smoking about 42 years ago. His smoking use included cigarettes. He has a 3.00 pack-year smoking history. He has never used smokeless tobacco. He reports current alcohol use. He reports that he does not use drugs.  Family History:  Family History  Problem Relation Age of Onset   Breast cancer Mother    Emphysema Father    Colon cancer Maternal Grandmother      Prior to Admission medications   Medication Sig Start Date End Date Taking? Authorizing Provider  albuterol (VENTOLIN HFA) 108 (90 Base) MCG/ACT inhaler TAKE 2 PUFFS BY MOUTH EVERY 4 HOURS AS NEEDED 01/02/23   Nelwyn Salisbury, MD  amLODipine (NORVASC) 10 MG tablet Take 1 tablet (10 mg total) by mouth daily. 09/18/22   Burnadette Pop, MD  Calcium Carb-Cholecalciferol (CALCIUM 500 + D PO) Take 1 tablet by mouth daily.    [provider]  dexamethasone (DECADRON) 4 MG tablet TAKE 2 TABLETS DAILY X 3 DAYS STARTING THE DAY AFTER CISPLATIN CHEMOTHERAPY. TAKE WITH FOOD. Patient not taking: Reported on 02/11/2023 12/14/22   [name], MD  enoxaparin (LOVENOX) 120 MG/0.8ML injection INJECT 0.8 MLS (120 MG TOTAL) INTO THE SKIN EVERY 12 (TWELVE) HOURS FOR 28 DAYS. 01/11/23   [name], MD  furosemide (LASIX) 20 MG tablet Take 1 tablet (20 mg total) by mouth daily as needed for edema. 02/11/23   Borders, Daryl Eastern, NP  glipiZIDE (GLUCOTROL) 10 MG tablet Take 1 tablet (10 mg total) by mouth 2 (two) times daily before a meal. Patient taking differently: Take 10 mg by mouth daily before breakfast. 08/08/22   Nelwyn Salisbury, MD  glucose blood test strip 1 each by Other route as needed for other. accu chek aviva plusUse as instructed    [provider]  levofloxacin (LEVAQUIN) 750 MG tablet Take 1 tablet (750 mg total) by mouth daily. Patient not taking: Reported on 02/11/2023 02/01/23   Bevelyn Ngo, NP  lidocaine-prilocaine (EMLA) cream Apply 1 Application topically as needed.  12/24/22   [name], MD  LORazepam (ATIVAN) 0.5 MG tablet TAKE 1 TABLET (0.5 MG TOTAL) BY MOUTH EVERY 8 (EIGHT) HOURS AS NEEDED FOR ANXIETY (NAUSEA VOMITING). 01/29/23   [name], MD  magnesium chloride (SLOW-MAG) 64 MG TBEC SR tablet Take 1 tablet (64 mg total) by mouth daily. Patient taking differently: Take 1 tablet by mouth 2 (two) times daily. 12/20/22   [name], MD  metFORMIN (GLUCOPHAGE) 500 MG tablet TAKE TWO TABLETS BY MOUTH EVERY MORNING WITH BREAKFAST 10/25/22   Nelwyn Salisbury, MD  MUCINEX D MAX STRENGTH [phone] MG TB12 Take 1,200 mg by mouth 2 (two) times daily as needed (for congestion or to loosen mucous in the chest). Patient not taking: Reported on 02/11/2023    [provider]  ondansetron (ZOFRAN) 8 MG tablet Take 1 tablet (8 mg total) by mouth every 8 (eight) hours as needed for nausea or vomiting. Start on the third day after cisplatin. 01/02/23  Yu, Zhou, MD  pravastatin (PRAVACHOL) 20 MG tablet Take 1 tablet (20 mg total) by mouth daily. 08/08/22   Nelwyn Salisbury, MD  prochlorperazine (COMPAZINE) 10 MG tablet TAKE 1 TABLET (10 MG TOTAL) BY MOUTH EVERY 6 HOURS AS NEEDED FOR NAUSEA AND VOMITING Patient not taking: Reported on 02/11/2023 01/02/23   [name], MD  tadalafil (CIALIS) 20 MG tablet Take 0.5-1 tablets (10-20 mg total) by mouth every other day as needed for erectile dysfunction. 08/08/22   Nelwyn Salisbury, MD  triamcinolone cream (KENALOG) 0.1 % Apply 1 Application topically 3 (three) times daily. Patient taking differently: Apply 1 Application topically 3 (three) times daily as needed (for bilateral, below-the-knees skin irritation). 07/03/22   Nelwyn Salisbury, MD    Physical Exam: Vitals:   02/13/23 1208 02/13/23 1400 02/13/23 1659 02/13/23 1700  BP: (!) 149/81 (!) 163/77  (!) 151/90  Pulse: (!) 102 95  (!) 102  Resp: (!) Temp:   98.3 F (36.8 C)   TempSrc:   Oral   SpO2: 99% 100%  100%   General: Not in acute distress HEENT:       Eyes: PERRL,  EOMI, no scleral icterus.       ENT: No discharge from the ears and nose, no pharynx injection, no tonsillar enlargement.        Neck: No JVD, no bruit, no mass felt. Heme: No neck lymph node enlargement. Cardiac: S1/S2, RRR, No murmurs, No gallops or rubs. Respiratory: No rales, wheezing, rhonchi or rubs. GI: Soft, nondistended, nontender, no rebound pain, no organomegaly, BS present. GU: No hematuria Ext: 3+ pitting leg edema bilaterally, with mild erythema in both legs. Musculoskeletal: No joint deformities, No joint redness or warmth, no limitation of ROM in spin. Skin: No rashes.  Neuro: Alert, oriented X3, cranial nerves II-XII grossly intact, moves all extremities normally. Psych: Patient is not psychotic, no suicidal or hemocidal ideation.  Labs on Admission: I have personally reviewed following labs and imaging studies  CBC: Recent Labs  Lab 02/11/23 1130 02/13/23 0808  WBC 2.4* 3.7*  NEUTROABS 1.7 2.5  HGB 8.6* 8.5*  HCT 27.0* 27.0*  MCV 102.7* 103.8*  PLT 88* 186   Basic Metabolic Panel: Recent Labs  Lab 02/11/23 1129 02/13/23 0808 02/13/23 1010  NA 136 137  --   K 3.6 3.4*  --   CL 103 106  --   CO2 22 23  --   GLUCOSE 107* 113*  --   BUN 7* 7*  --   CREATININE 0.84 0.89  --   CALCIUM 8.7* 8.3*  --   MG 1.6* 1.5*  --   PHOS  --   --  3.3   GFR: Estimated Creatinine Clearance: 128.3 mL/min (by C-G formula based on SCr of 0.89 mg/dL). Liver Function Tests: Recent Labs  Lab 02/11/23 1129 02/13/23 0808  AST 26 24  ALT 17 16  ALKPHOS 50 52  BILITOT 0.6 0.4  PROT 6.2* 6.0*  ALBUMIN 3.4* 3.5   No results for input(s): "LIPASE", "AMYLASE" in the last 168 hours. No results for input(s): "AMMONIA" in the last 168 hours. Coagulation Profile: No results for input(s): "INR", "PROTIME" in the last 168 hours. Cardiac Enzymes: No results for input(s): "CKTOTAL", "CKMB", "CKMBINDEX", "TROPONINI" in the last 168 hours. BNP (last 3 results) No results for  input(s): "PROBNP" in the last 8760 hours.Rickard PatienceC: No results for input(s): "HGBA1C" in the last 72 hours. CBG: Recent  Labs  Lab 02/13/23 1637  GLUCAP 153*   Lipid Profile: No results for input(s): "CHOL", "HDL", "LDLCALC", "TRIG", "CHOLHDL", "LDLDIRECT" in the last 72 hours. Thyroid Function Tests: Recent Labs    02/13/23 1010  TSH 0.269*   Anemia Panel: No results for input(s): "VITAMINB12", "FOLATE", "FERRITIN", "TIBC", "IRON", "RETICCTPCT" in the last 72 hours. Urine analysis:    Component Value Date/Time   COLORURINE YELLOW 09/15/2022 1549   APPEARANCEUR CLEAR 09/15/2022 1549   LABSPEC 1.020 09/15/2022 1549   PHURINE 5.0 09/15/2022 1549   GLUCOSEU NEGATIVE 09/15/2022 1549   HGBUR NEGATIVE 09/15/2022 1549   HGBUR negative 11/23/2009 0844   BILIRUBINUR NEGATIVE 09/15/2022 1549   BILIRUBINUR 1+ 11/18/2017 1600   KETONESUR NEGATIVE 09/15/2022 1549   PROTEINUR NEGATIVE 09/15/2022 1549   UROBILINOGEN 4.0 (A) 11/18/2017 1600   UROBILINOGEN 0.2 11/23/2009 0844   NITRITE NEGATIVE 09/15/2022 1549   LEUKOCYTESUR NEGATIVE 09/15/2022 1549   Sepsis Labs: @LABRCNTIP (procalcitonin:4,lacticidven:4) )No results found for this or any previous visit (from the past 240 hour(s)).   Radiological Exams on Admission: CT Angio Chest PE W and/or Wo Contrast  Result Date: 02/13/2023 CLINICAL DATA:  Pulmonary embolism suspected. High probability DVT. Dyspnea. EXAM: CT ANGIOGRAPHY CHEST WITH CONTRAST TECHNIQUE: Multidetector CT imaging of the chest was performed using the standard protocol during bolus administration of intravenous contrast. Multiplanar CT image reconstructions and MIPs were obtained to evaluate the vascular anatomy. RADIATION DOSE REDUCTION: This exam was performed according to the departmental dose-optimization program which includes automated exposure control, adjustment of the mA and/or kV according to patient size and/or use of iterative reconstruction technique. CONTRAST:   50mL OMNIPAQUE IOHEXOL 350 MG/ML SOLN COMPARISON:  02/01/2023 FINDINGS: Cardiovascular: Heart is mildly enlarged. No pericardial effusion. There are coronary artery calcifications. The pulmonary arteries are moderately well opacified and there is no acute pulmonary embolus. RIGHT-sided port tip extends to the level of the LOWER superior vena cava/UPPER RIGHT atrium. Mediastinum/Nodes: The LEFT lobe of the thyroid gland is enlarged and nodular, consistent with goiter. The appearance is unchanged. Esophagus is unremarkable. There are scattered sub centimeter mediastinal and axillary lymph nodes. Lungs/Pleura: Airways are patent. There are bilateral pleural effusions. LEFT LOWER lobe airspace filling and atelectasis are noted, slightly improved compared to prior study. There has been improvement in RIGHT UPPER and LOWER lobe aeration compared to prior study. Upper Abdomen: There is marked heterogeneous appearance of the liver. The mass previously noted in the RIGHT hepatic lobe is estimated to measure 7.7 centimeters. Cirrhotic morphology of the liver. There is adenopathy in the gastrohepatic ligament, largest node measuring 1 6 centimeters in short axis. Musculoskeletal: There are degenerative changes in the spine. A sclerotic lesion in the UPPER sternum is 7 millimeters, suspicious for metastatic disease. Review of the MIP images confirms the above findings. IMPRESSION: 1. Technically adequate exam showing no acute pulmonary embolus. 2. Cardiomegaly. Coronary artery calcifications. 3. Bilateral pleural effusions. 4. Improved aeration of the RIGHT UPPER and LOWER lobes compared to prior study. 5. LEFT LOWER lobe and lingula airspace filling and atelectasis, slightly improved compared to prior study. 6. Cirrhotic morphology of the liver. 7.7 centimeters mass in the RIGHT hepatic lobe. 7. Gastrohepatic ligament adenopathy. 8. A 7 millimeters sclerotic lesion in the UPPER sternum is suspicious for metastatic disease. 9.  LEFT thyroid goiter. Electronically Signed   By: Norva Pavlov M.D.   On: 02/13/2023 12:09      Assessment/Plan Principal Problem:   Atrial flutter with rapid ventricular response Active Problems:  Cholangiocarcinoma   Antiphospholipid syndrome   DVT (deep venous thrombosis)   Hyperlipemia   Essential hypertension   Hypomagnesemia   Hypokalemia   Macrocytic anemia   Diabetes mellitus without complication   SOB (shortness of breath)   Obesity (BMI 30-39.9)   Assessment and Plan:  Atrial flutter with rapid ventricular response: HT up to 140s --> 90s now. Consulted Dr. Okey Dupre of card.  -admitted to PCU as inpt -Metoprolol titrate, 12.5 mg every 6 hours -As needed IV metoprolol 5 mg every 2 hour for heart rate> 125 -Continue subcutaneous Lovenox 120 mg twice daily which is also for DVT  Cholangiocarcinoma: pt is on chemotherapy -consulted Dr. Cathie Hoops of oncology.  Antiphospholipid syndrome and DVT (deep venous thrombosis) -continue home Lovenox 120 mg twice daily  Hyperlipemia -Pravastatin  Essential hypertension -IV hydralazine as needed -Metoprolol -Amlodipine  Hypomagnesemia and hypokalemia: Potassium 3.4, magnesium 1.8 -Repleted both -Check phosphorus level --> 3.3  Macrocytic anemia: Hemoglobin stable 8.5 (8.6 on 02/11/2023) -Follow-up with CBC  Diabetes mellitus without complication: Recent A1c 5.8.  Well-controlled.  Patient taking metformin and glipizide at home -Sliding scale insulin  SOB (shortness of breath): Etiology is not clear.  CTA negative for PE.  No signs of pneumonia.  BNP 119.  Patient has 3+ bilateral leg edema.  Patient may have underlying CHF.  2D echo on 08/19/2022 showed EF of 60 to 65%. -Started Lasix 40 mg twice daily per cards -Follow-up 2D echo  Obesity (BMI 30-39.9): Body weight 140.2 kg, BMI 38.63 -Encourage losing weight -Exercise and healthy diet     DVT ppx: on full dose of Lovenox  Code Status: Full code  Family  Communication: I offered to call his family, but patient states that his wife was with him earlier, who knows what is going on for him.  He said I do not need to call his family.  Disposition Plan:  Anticipate discharge back to previous environment  Consults called:  Dr. Cathie Hoops of oncology and Dr. Okey Dupre of cardiology are consulted.  Admission status and Level of care: Progressive:   as inpt      Dispo: The patient is from: Home              Anticipated d/c is to: Home              Anticipated d/c date is: 2 days              Patient currently is not medically stable to d/c.    Severity of Illness:  The appropriate patient status for this patient is INPATIENT. Inpatient status is judged to be reasonable and necessary in order to provide the required intensity of service to ensure the patient's safety. The patient's presenting symptoms, physical exam findings, and initial radiographic and laboratory data in the context of their chronic comorbidities is felt to place them at high risk for further clinical deterioration. Furthermore, it is not anticipated that the patient will be medically stable for discharge from the hospital within 2 midnights of admission.   * I certify that at the point of admission it is my clinical judgment that the patient will require inpatient hospital care spanning beyond 2 midnights from the point of admission due to high intensity of service, high risk for further deterioration and high frequency of surveillance required.*       Date of Service 02/13/2023    Lorretta Harp [facility]   If 7PM-7AM, please contact night-coverage [email or web address] 02/13/2023, 6:34 PM

## 2023-02-13 NOTE — Progress Notes (Signed)
Hematology/Oncology Progress note [phone] [phone]    CHIEF COMPLAINTS/REASON FOR VISIT:  Follow up for right lower extremity DVT, antiphospholipid syndrome, metastatic cholangiocarcinoma.  ASSESSMENT & PLAN:   Cancer Staging  Cholangiocarcinoma Staging form: Intrahepatic Bile Duct, AJCC 8th Edition - Clinical stage from 08/27/2022: Stage IV (cT2, cN1, cM1) - Signed by Christopher Patience, MD on 09/13/2022   Cholangiocarcinoma [facility]) Imaging findings and pathology results were reviewed with patient.  Clinical diagnosis is cholangiocarcinoma, although atypical with both normal CEA and CA 19.9 Interim PET scan after 3 cycles of treatment showed mixed response- decreased hypermetabolism but increased size.   He has had new bone lesions. Refer to radiation oncology for feasibility of treating new bone lesions.  For now I will continue current regimen with short-term repeat imaging. Labs are reviewed and discussed with patient. S/p 6 cycles  Gemcitabine, Cisplatin and Durvalumab.  Overall he tolerates very well.  Hold off treatment today due to tachycardia. Consider repeat Abdomen Pelvis for treatment response assessment.    NGS-Tempus liquid biopsy FGFR2 -ADK mutation, TMB 0,  2nd line options include Futibatinib, or Pemigatinib or Infigratinib-discussed about NGS results with [facility] oncology Dr. Hulen Marsh   Antiphospholipid syndrome [facility]) #Recurrent lower extremity DVT, secondary to antiphospholipid syndrome.-.  Persistent lupus anticoagulant positive.Recommend life time anticoagulation Patient was previously on Coumadin now on Lovenox. Recent US reviewed. Bilateral lower extremity DVT is likely due to non therapeutic dose of Lovenox.[Self decreased] Now he is back on Lovenox 1 mg/kg twice daily, continue.   Metastasis to bone [facility]) Follow up with radiation oncology for palliative radiation to bone lesions.  Consider bisphosphonate after dental clearance is  obtained.   Antineoplastic chemotherapy induced anemia Hemoglobin trended down.  Close monitor weekly.  Possible need of blood transfusion if hemoglobin drops below 8.  Lung infiltrate on CT He follows up with pulmonology.  DDX includes organizing pneumonia, ILD, aspiration, lymphatic spread, etc. He declined repeat bronchoscopy/biopsy.  Worse infiltrates on most recent CT scan.  Continue to monitor.   Hypocalcemia Continue calcium supplementation.   Tachycardia EKG was performed in the clinic, reviewed by me. A Fib w RVR Recommend patient to go to ER for further evaluation.  Discussed with ER physician. I agree repeating CTA chest, cardiology evaluation.    Orders Placed This Encounter  Procedures   Cancer antigen 19-9    Standing Status:   Future    Standing Expiration Date:   02/20/2024   CEA    Standing Status:   Future    Standing Expiration Date:   02/20/2024   CBC with Differential    Standing Status:   Future    Standing Expiration Date:   02/20/2024   Comprehensive metabolic panel    Standing Status:   Future    Standing Expiration Date:   02/20/2024   Magnesium    Standing Status:   Future    Standing Expiration Date:   02/20/2024   EKG 12-Lead    Standing Status:   Future    Number of Occurrences:   1    Standing Expiration Date:   02/13/2024     Follow up Per LOS  All questions were answered. The patient knows to call the clinic with any problems, questions or concerns.  Christopher Patience, MD, PhD [facility] Hematology Oncology 02/13/2023    HISTORY OF PRESENTING ILLNESS:  Patient reports remote history of left lower extremity DVT in 2002.  He was initiated on Lovenox and bridged to Coumadin.  Patient  took warfarin for 2 years before anticoagulation was stopped. 12/03/2021, patient presented to emergency room for evaluation of right lower extremity pain and swelling for about a week.  Started on the inner side of right thigh and migrated to the right calf. +  Associated with swelling.  Denies any recent injury, hospitalization, surgery.  He first noticed the symptoms after playing basketball with his grandson.   12/03/2021, right lower extremity ultrasound showed occlusive DVT extending from the mid aspect of the femoral vein through the imaged tibial vein.  Age-indeterminate. Patient was started on Xarelto. He was referred to establish care with vascular surgeon and was seen by Christopher Marsh on 12/06/2021.  Shared decision was made not to proceed with embolectomy.  Continue anticoagulation. Patient was referred to hematology oncology for further evaluation.  Patient denies any family history of blood clots.  Denies any unintentional weight loss, fever or night sweats. 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  He sometimes stay in his car while waiting for next assessment appointment.  He reports the right lower extremity symptom has improved since the start of Xarelto.  No active bleeding events.  #12/18/2021 hypercoagulable work-up showed JAK2 V617F mutation negative, with reflex to other mutations CALR, MPL, JAK 2 Ex 12-15 mutations negative, negative anticardiolipin IgG and IgM antibodies, positive lupus anticoagulant, negative factor V Leiden mutation, negative prothrombin gene mutation, normal protein C activity, normal protein S antigen level. Patient was recommended to switch to Coumadin with INR goal of 2-3.  #03/15/2022, repeat lupus anticoagulant is persistently positive- + antiphospholipid syndrome.  Patient is currently on Lovenox 1 mg/kg twice daily.  admitted due to multifocal pneumonia, treated with Influenza panel negative, COVID-negative, HIV negative, Complete course of antibiotics with Zithromax, Vantin   + HCV antibody positive, HCV RNA quantification not detected. HCV RT PCR not detected.   INTERVAL HISTORY Christopher Marsh is a 62 y.o. male  who has above history reviewed by me today presents for follow up visit for management of right lower extremity DVT and antiphospholipid syndrome, metastatic cholangiocarcinoma.  Oncology History  Cholangiocarcinoma  10/30/2021 Imaging   CT chest wo contrast 1. Heterogeneous bilateral ground-glass disease, multilobar but worst in the right middle lobe, lingula and lower lobes. There is progression of the abnormality since 10/12/2022. Though pneumonia could produce this appearance, given persistence and fluctuating appearance since October, consider alternative entities such as hypersensitivity pneumonitis or idiopathic interstitial pneumonia. 2. Slightly enlarged pulmonary trunk, possible arterial hypertension 3. Redemonstrated large hypodense right hepatic lobe mass.   08/18/2022 Imaging   CT chest w contrast showed 1. Multifocal airspace disease in both lungs, left side greater than right. Findings are most compatible with multifocal pneumonia. 2. Multiple new hepatic lesions with enlarging lymph nodes in the upper abdomen and chest. Findings are concerning for metastatic disease. Based on the multifocal pneumonia, hepatic abscesses would also be in the differential diagnosis. Recommend further characterization of the abdomen and pelvis with CT with IV contrast. 3. Enlargement of the main pulmonary artery could be associated with pulmonary hypertension. 4. Coronary artery calcifications. 5. Multinodular goiter. Patient has known thyroid nodules and previous thyroid ultrasound.    08/18/2022 Imaging   CT abdomen pelvis w contrast  Interval development of 6.5 x 5.2 cm heterogeneously enhancing mass in right hepatic lobe. This is highly concerning for neoplasm or malignancy, and further evaluation with MRI is recommended.   Mildly enlarged periaortic  adenopathy is noted, with the largest lymph node measuring 12 mm. Metastatic disease cannot be excluded. Mildly enlarged adenopathy is also noted in  the gastrohepatic ligament, but this is unchanged compared to prior exam.   Stable bibasilar lung opacities are noted concerning for inflammation, atelectasis or possibly scarring.   Grossly stable multi-septated cystic lesion is seen involving upper pole of right kidney with peripheral calcifications compared to prior exam of 2019, most likely representing benign etiology.   Aortic Atherosclerosis    08/18/2022 - 08/21/2022 Hospital Admission   Admitted due to multifocal pneumonia, treated with Influenza panel negative, COVID-negative, HIV negative, Complete course of antibiotics with Zithromax, Vantin     08/19/2022 Imaging   MR abdomen w wo contrast  Marked caudate lobe hypertrophy, highly suspicious for hepatic cirrhosis.   Numerous hypervascular masses throughout the right hepatic lobe, highly suspicious for multifocal hepatocellular carcinoma. Recommend correlation with AFP and consider tissue sampling.   Mild abdominal lymphadenopathy, with differential diagnosis including metastatic disease and reactive lymphadenopathy in setting of cirrhosis.   Bilateral lower lobe infiltrates, as better demonstrated on recent CT.    08/19/2022 Tumor Marker   AFP 4.1   08/27/2022 Initial Diagnosis   Cholangiocarcinoma  -08/20/22 Liver mass biopsy showed poorly differentiated carcinoma Immunohistochemical stains show that the poorly differentiated carcinoma is positive for CK7 and MOC-31, suggestive of a poorly differentiated adenocarcinoma.  Immunohistochemical stains for CK20, CDX2, HepPar 1 and arginase are negative.  Immunostain for glypican-3 shows very focal  likely nonspecific labeling.  This immunoprofile is nonspecific but can be compatible with a poorly differentiated primary cholangiocarcinoma in absence of any other lesions.   NGS showed BAP 1 mutation.  Case was presented at tumor board, I have also discussed with pathologist Dr.Rubinas,  IHC pattern is more consistent with  adenocarcinoma, unlikely HCC [due to negative HepPar 1 and arginase], cholangiocarcinoma is favored, although this is a diagnosis of exclusion. Second opinion ar [facility]   08/27/2022 Cancer Staging   Staging form: Intrahepatic Bile Duct, AJCC 8th Edition - Clinical stage from 08/27/2022: Stage IV (cT2, cN1, cM1) - Signed by Christopher Patience, MD on 09/13/2022 Stage prefix: Initial diagnosis   08/27/2022 Tumor Marker   CA 19.9  16 CEA 2.8   09/12/2022 Imaging   PET scan showed 1. Large right hepatic lobe mass is hypermetabolic and consistent with known cholangiocarcinoma. 2. Scattered borderline enlarged upper abdominal lymph nodes do not show any significant FDG uptake. 3. 4 hypermetabolic bone lesions consistent with metastatic disease. 4. Progressive diffuse airspace process in the left lung could be due to atypical pneumonia, pulmonary hemorrhage or left-sided aspiration. 5. Aortic atherosclerosis.  09/12/2022 I had a phone discussion with patient after his PET scan resulted.  Recommend systemic chemotherapy.  Patient would like to defer until his second opinion visit at [facility].   09/15/2022 Imaging   CT angio chest pulmonary embolism protocol showed 1. No evidence for pulmonary embolism. 2. Progression of bilateral multifocal patchy ground-glass and interstitial opacities, left greater than right. Findings are concerning for multifocal pneumonia. 3. Air-fluid levels in the left lower lobe bronchi worrisome for aspiration. 4. Mediastinal and hilar lymphadenopathy has increased in size and number when compared to the prior study, likely reactive.5. Cardiomegaly. 6. Findings compatible with pulmonary artery hypertension.7. Stable right liver mass and complex right renal cystic lesion. 8. Stable enlargement of the left thyroid.   09/15/2022 - 09/17/2022 Hospital Admission   Patient was admitted due to shortness of breath, CT showed progression of  bilateral multifocal patchy groundglass and  interstitial opacities.  Left greater than right.  Concerning for multifocal pneumonia.  Patient was started on broad-spectrum antibiotics with significant improvement.  He was discharged on oral antibiotics.   09/19/2022 Miscellaneous   Patient went to [facility] for second opinion.  He was seen by Shirlee Latch, who felt that the presentation is consistent with metastatic cholangiocarcinoma, and agrees with the plan of cisplatin/gemcitabine/durvalumab..    10/11/2022 -  Chemotherapy   Patient is on Treatment Plan : Cisplatin D1,8 + Gemcitabine + Durvalumab  D1,8 q21d x     10/12/2022 Imaging   CT chest w contrast 1. Bilateral airspace and ground-glass opacities have mildly decreased in amount and density when compared to the prior study. Findings are compatible with resolving multifocal pneumonia. Continued follow-up imaging recommended. 2. Stable enlarged heterogeneous left thyroid gland with 3.2 cm nodule. This can be further evaluated with dedicated ultrasound. 3. Stable hepatic and right renal lesions, incompletely evaluated.   Aortic Atherosclerosis (ICD10-I70.0).   12/11/2022 Imaging   PET showed  1. There is been mild increase in size of the tracer avid mass within right lobe of liver compatible with known cholangiocarcinoma. The degree of tracer uptake however is slightly decreased in the interval. 2. Multifocal tracer avid bone metastases are again noted. When compared with the previous exam there has been an mixed interval response to therapy. Specifically, there has been interval increase in size and FDG uptake associated with the right ischial tuberosity lesion and there is a new lesion within the posterior left iliac bone. Decreased tracer uptake is associated with the left scapular lesion and there is stable tracer uptake associated with the right T1 transverse process lesion. 3. Persistent ground-glass and airspace densities within the left upper lobe and left lower lobe compatible  with inflammatory or infectious process. Interval resolution of previous right lung ground-glass and airspace densities.    12/24/2022 Procedure   Medi port placement by Dr. Wyn Quaker   01/22/2023 Imaging   CT chest abdomen pelvis with contrast 1. Hypodense lesion of the right lobe of the liver, hepatic segment VII/VIII is slightly diminished in size, consistent with treatment response. 2. No evident change in additional smaller hypodense lesions scattered throughout the right lobe of the liver, these more difficult to see on prior noncontrast PET-CT. 3. Unchanged enlarged celiac axis, gastrohepatic ligament, and retroperitoneal lymph nodes. Unchanged prominent high right paratracheal lymph nodes. 4. Unchanged osseous metastases. 5. Slightly improved, although still extensive ground-glass airspace opacity primarily seen throughout the left lower lobe and lingula,nonspecific and infectious or inflammatory. 6. Coronary artery disease. 7. Emphysema.    INTERVAL HISTORY Christopher Marsh is a 62 y.o. male who has above history reviewed by me today presents for follow up visit for  Cholangiocarcinoma, antiphospholipid syndrome, thrombosis He tolerated chemotherapy.   + lower extremity swelling, he self decreased Lovenox to once daily due to recent nose bleeding.  + [place] lower extremity showed bilateral nonocclusive DVT within the popliteal veins bilaterally, Occlusive DVT within the posterior tibial veins of the right calf. He has been recommended to resume Lovenox /kg BID.  + tachycardia in the clinic.   He saw pulmonology recently. 02/01/23 Repeat CTA showed no PE, airspace disease in the lingula and left lower lobe, progressed since prior study. He was recommended to finish a course of Levaquin  daily x 5. He has finished and felt cough is better.     Review of Systems  Constitutional:  Positive for fatigue.  Negative for appetite change, chills and fever.  HENT:   Negative for hearing loss  and voice change.   Eyes:  Negative for eye problems.  Respiratory:  Positive for shortness of breath. Negative for chest tightness and cough.   Cardiovascular:  Positive for leg swelling. Negative for chest pain.  Gastrointestinal:  Negative for abdominal distention, abdominal pain and blood in stool.  Endocrine: Negative for hot flashes.  Genitourinary:  Negative for difficulty urinating and frequency.   Musculoskeletal:  Negative for arthralgias.  Skin:  Negative for itching and rash.  Neurological:  Negative for extremity weakness.  Hematological:  Negative for adenopathy. Bruises/bleeds easily.  Psychiatric/Behavioral:  Negative for confusion.     MEDICAL HISTORY:  Past Medical History:  Diagnosis Date   Antiphospholipid antibody syndrome    Arthritis    Cancer    Diabetes mellitus without complication    DVT (deep venous thrombosis)    Lt leg   Dyspnea    Elevated lipids    Erythrocytosis    Hyperlipidemia    Hypertension    Lower extremity edema    Multinodular thyroid    Post-thrombotic syndrome     SURGICAL HISTORY: Past Surgical History:  Procedure Laterality Date   COLONOSCOPY WITH PROPOFOL N/A 09/23/2018   Per Dr. Norma Fredrickson, polyp, repeat in 3 yrs    EYE MUSCLE SURGERY     JOINT REPLACEMENT Left 07/22/2017   PORTA CATH INSERTION N/A 12/24/2022   Procedure: PORTA CATH INSERTION;  Surgeon: Annice Needy, MD;  Location: [facility] INVASIVE CV LAB;  Service: Cardiovascular;  Laterality: N/A;   TOTAL HIP ARTHROPLASTY Left 07/22/2017   Procedure: TOTAL HIP ARTHROPLASTY;  Surgeon: Donato Heinz, MD;  Location: [facility] ORS;  Service: Orthopedics;  Laterality: Left;    SOCIAL HISTORY: Social History   Socioeconomic History   Marital status: Married    Spouse name: Not on file   Number of children: Not on file   Years of education: Not on file   Highest education level: Not on file  Occupational History   Not on file  Tobacco Use   Smoking status: Former    Packs/day:  0.50    Years: 6.00    Additional pack years: 0.00    Total pack years: 3.00    Types: Cigarettes    Quit date: 85    Years since quitting: 42.3   Smokeless tobacco: Never   Tobacco comments:    Pt states when he did smoke he smoked at most 1 ppd. ALS 09/26/2022  Vaping Use   Vaping Use: Never used  Substance and Sexual Activity   Alcohol use: Yes    Comment: occ   Drug use: No   Sexual activity: Not on file  Other Topics Concern   Not on file  Social History Narrative   Not on file   Social Determinants of Health   Financial Resource Strain: Low Risk  (08/27/2022)   Overall Financial Resource Strain (CARDIA)    Difficulty of Paying Living Expenses: Not very hard  Food Insecurity: No Food Insecurity (09/15/2022)   Hunger Vital Sign    Worried About Running Out of Food in the Last Year: Never true    Ran Out of Food in the Last Year: Never true  Transportation Needs: No Transportation Needs (09/15/2022)   PRAPARE - Administrator, Civil Service (Medical): No    Lack of Transportation (Non-Medical): No  Physical Activity: Insufficiently Active (08/27/2022)   Exercise Vital  Sign    Days of Exercise per Week: 3 days    Minutes of Exercise per Session: 30 min  Stress: Stress Concern Present (08/27/2022)   [employer] of Occupational Health - Occupational Stress Questionnaire    Feeling of Stress : Rather much  Social Connections: Socially Integrated (08/27/2022)   Social Connection and Isolation Panel [NHANES]    Frequency of Communication with Friends and Family: More than three times a week    Frequency of Social Gatherings with Friends and Family: More than three times a week    Attends Religious Services: 1 to 4 times per year    Active Member of [employer] or Organizations: Yes    Attends Banker Meetings: Never    Marital Status: Married  Catering manager Violence: Not At Risk (09/15/2022)   Humiliation, Afraid, Rape, and Kick  questionnaire    Fear of Current or Ex-Partner: No    Emotionally Abused: No    Physically Abused: No    Sexually Abused: No    FAMILY HISTORY: Family History  Problem Relation Age of Onset   Breast cancer Mother    Emphysema Father    Colon cancer Maternal Grandmother     ALLERGIES:  has No Known Allergies.  MEDICATIONS:  No current facility-administered medications for this visit.   Current Outpatient Medications  Medication Sig Dispense Refill   albuterol (VENTOLIN HFA) 108 (90 Base) MCG/ACT inhaler TAKE 2 PUFFS BY MOUTH EVERY 4 HOURS AS NEEDED 8.5 each 1   amLODipine (NORVASC) 10 MG tablet Take 1 tablet (10 mg total) by mouth daily. 30 tablet 0   Calcium Carb-Cholecalciferol (CALCIUM 500 + D PO) Take 1 tablet by mouth daily.     enoxaparin (LOVENOX) 120 MG/0.8ML injection INJECT 0.8 MLS (120 MG TOTAL) INTO THE SKIN EVERY 12 (TWELVE) HOURS FOR 28 DAYS. 44.8 mL 2   furosemide (LASIX) 20 MG tablet Take 1 tablet (20 mg total) by mouth daily as needed for edema. 30 tablet 1   glipiZIDE (GLUCOTROL) 10 MG tablet Take 1 tablet (10 mg total) by mouth 2 (two) times daily before a meal. (Patient taking differently: Take 10 mg by mouth daily before breakfast.) 180 tablet 3   glucose blood test strip 1 each by Other route as needed for other. accu chek aviva plusUse as instructed     lidocaine-prilocaine (EMLA) cream Apply 1 Application topically as needed. 30 g 5   LORazepam (ATIVAN) 0.5 MG tablet TAKE 1 TABLET (0.5 MG TOTAL) BY MOUTH EVERY 8 (EIGHT) HOURS AS NEEDED FOR ANXIETY (NAUSEA VOMITING). 60 tablet 0   magnesium chloride (SLOW-MAG) 64 MG TBEC SR tablet Take 1 tablet (64 mg total) by mouth daily. (Patient taking differently: Take 1 tablet by mouth 2 (two) times daily.) 30 tablet 3   metFORMIN (GLUCOPHAGE) 500 MG tablet TAKE TWO TABLETS BY MOUTH EVERY MORNING WITH BREAKFAST 180 tablet 1   ondansetron (ZOFRAN) 8 MG tablet Take 1 tablet (8 mg total) by mouth every 8 (eight) hours as  needed for nausea or vomiting. Start on the third day after cisplatin. 90 tablet 1   pravastatin (PRAVACHOL) 20 MG tablet Take 1 tablet (20 mg total) by mouth daily. 90 tablet 3   tadalafil (CIALIS) 20 MG tablet Take 0.5-1 tablets (10-20 mg total) by mouth every other day as needed for erectile dysfunction. 20 tablet 11   triamcinolone cream (KENALOG) 0.1 % Apply 1 Application topically 3 (three) times daily. (Patient taking differently: Apply 1 Application topically  3 (three) times daily as needed (for bilateral, below-the-knees skin irritation).) 453.6 g 1   dexamethasone (DECADRON) 4 MG tablet TAKE 2 TABLETS DAILY X 3 DAYS STARTING THE DAY AFTER CISPLATIN CHEMOTHERAPY. TAKE WITH FOOD. (Patient not taking: Reported on 02/11/2023) 30 tablet 1   levofloxacin (LEVAQUIN) 750 MG tablet Take 1 tablet (750 mg total) by mouth daily. (Patient not taking: Reported on 02/11/2023) 5 tablet 0   MUCINEX D MAX STRENGTH [phone] MG TB12 Take 1,200 mg by mouth 2 (two) times daily as needed (for congestion or to loosen mucous in the chest). (Patient not taking: Reported on 02/11/2023)     prochlorperazine (COMPAZINE) 10 MG tablet TAKE 1 TABLET (10 MG TOTAL) BY MOUTH EVERY 6 HOURS AS NEEDED FOR NAUSEA AND VOMITING (Patient not taking: Reported on 02/11/2023) 90 tablet 1   Facility-Administered Medications Ordered in Other Visits  Medication Dose Route Frequency Provider Last Rate Last Admin   magnesium sulfate IVPB 1 g 100 mL  1 g Intravenous Once Sharyn Creamer, MD 100 mL/hr at 02/13/23 1207 1 g at 02/13/23 1207     PHYSICAL EXAMINATION: ECOG PERFORMANCE STATUS: 1 - Symptomatic but completely ambulatory Vitals:   02/13/23 0829  BP: (!) 140/65  Pulse: (!) 140  Temp: (!) 97.2 F (36.2 C)  SpO2: 100%   Filed Weights   02/13/23 0829  Weight: (!) 309 lb 1.6 oz (140.2 kg)    Physical Exam Constitutional:      General: He is not in acute distress. HENT:     Head: Normocephalic and atraumatic.  Eyes:      General: No scleral icterus. Cardiovascular:     Rate and Rhythm: Normal rate.  Pulmonary:     Effort: Pulmonary effort is normal. No respiratory distress.     Breath sounds: No wheezing.  Abdominal:     General: Bowel sounds are normal. There is no distension.  Musculoskeletal:        General: Normal range of motion.     Cervical back: Normal range of motion and neck supple.     Right lower leg: No edema.     Comments: worse lower extremity swelling, varicose veins  Skin:    General: Skin is warm and dry.     Findings: No erythema or rash.  Neurological:     Mental Status: He is alert and oriented to person, place, and time. Mental status is at baseline.     Cranial Nerves: No cranial nerve deficit.  Psychiatric:        Mood and Affect: Mood normal.     LABORATORY DATA:  I have reviewed the data as listed    Latest Ref Rng & Units 02/13/2023    8:08 AM 02/11/2023   11:30 AM 01/30/2023    8:47 AM  CBC  WBC 4.0 - 10.5 K/uL 3.7  2.4  1.8   Hemoglobin 13.0 - 17.0 g/dL 8.5  8.6  9.2   Hematocrit 39.0 - 52.0 % 27.0  27.0  28.5   Platelets 150 - 400 K/uL 186  88  218       Latest Ref Rng & Units 02/13/2023    8:08 AM 02/11/2023   11:29 AM 01/30/2023    8:47 AM  CMP  Glucose 70 - 99 mg/dL 161  096  045   BUN 8 - 23 mg/dL 7  7  11    Creatinine 0.61 - 1.24 mg/dL 4.09  8.11  9.14   Sodium 135 - 145 mmol/L  137  136  134   Potassium 3.5 - 5.1 mmol/L 3.4  3.6  3.8   Chloride 98 - 111 mmol/L 106  103  102   CO2 22 - 32 mmol/L Calcium 8.9 - 10.3 mg/dL 8.3  8.7  8.1   Total Protein 6.5 - 8.1 g/dL 6.0  6.2  5.8   Total Bilirubin 0.3 - 1.2 mg/dL 0.4  0.6  0.3   Alkaline Phos 38 - 126 U/L 52  50  50   AST 15 - 41 U/L ALT 0 - 44 U/L RADIOGRAPHIC STUDIES: I have personally reviewed the radiological images as listed and agreed with the findings in the report. DG Chest 2 View  Result Date: 02/11/2023 CLINICAL DATA:  SOB EXAM: CHEST - 2 VIEW  COMPARISON:  09/15/2022 FINDINGS: Hyperinflated lungs consistent with COPD. Enlarged cardiac silhouette. Bibasilar minimal consolidation volume loss. Bilateral small pleural effusions. Thoracic degenerative changes. Right-sided Port-A-Cath tip distal SVC. IMPRESSION: Enlarged cardiac silhouette. Hyperinflated lungs consistent with COPD. Small pleural effusions and minimal bibasilar consolidation. Electronically Signed   By: Layla Maw M.D.   On: 02/11/2023 15:22   US Venous Img Lower Bilateral  Result Date: 02/11/2023 CLINICAL DATA:  History of DVT, bilateral lower extremity edema EXAM: BILATERAL LOWER EXTREMITY VENOUS DOPPLER ULTRASOUND TECHNIQUE: Gray-scale sonography with compression, as well as color and duplex ultrasound, were performed to evaluate the deep venous system(s) from the level of the common femoral vein through the popliteal and proximal calf veins. COMPARISON:  None Available. FINDINGS: VENOUS Normal compressibility of the common femoral, and superficial femoral veins. There is nonocclusive thrombus identified within the popliteal veins bilaterally. The posterior tibial veins of the right catheter largely occluded. Left posterior tibial veins are patent. Visualized portions of profunda femoral vein and great saphenous vein unremarkable. Doppler waveforms show normal direction of venous flow, normal respiratory plasticity and response to augmentation. OTHER None. Limitations: none IMPRESSION: 1. Nonocclusive DVT within the popliteal veins bilaterally. 2. Occlusive DVT within the posterior tibial veins of the right calf. 3. These results will be called to the ordering clinician or representative by the Radiologist Assistant, and communication documented in the PACS or [employer]. Electronically Signed   By: Helyn Numbers M.D.   On: 02/11/2023 15:18   CT Angio Chest Pulmonary Embolism (PE) W or WO Contrast  Result Date: 02/01/2023 CLINICAL DATA:  Shortness of breath EXAM: CT  ANGIOGRAPHY CHEST WITH CONTRAST TECHNIQUE: Multidetector CT imaging of the chest was performed using the standard protocol during bolus administration of intravenous contrast. Multiplanar CT image reconstructions and MIPs were obtained to evaluate the vascular anatomy. RADIATION DOSE REDUCTION: This exam was performed according to the departmental dose-optimization program which includes automated exposure control, adjustment of the mA and/or kV according to patient size and/or use of iterative reconstruction technique. CONTRAST:  80mL ISOVUE-370 IOPAMIDOL (ISOVUE-370) INJECTION 76% COMPARISON:  01/22/2023 FINDINGS: Cardiovascular: No filling defects in the pulmonary arteries to suggest pulmonary emboli. Heart is normal size. Aorta is normal caliber. Scattered coronary artery and aortic calcifications. Mediastinum/Nodes: No mediastinal, hilar, or axillary adenopathy. Trachea and esophagus are unremarkable. Large left thyroid goiter again noted, unchanged. Lungs/Pleura: Biapical scarring. Ground-glass airspace disease in the lingula and left lower lobe, progressed since prior study. Similar findings also now seen in the right lung base. Findings compatible with worsening pneumonia. No visible effusions or pneumothorax.  Upper Abdomen: Large right hepatic lobe mass again seen as on prior imaging compatible with patient's known cholangiocarcinoma. Musculoskeletal: No acute findings Review of the MIP images confirms the above findings. IMPRESSION: Airspace disease within the lingula and left lower lobe as well as right lung base compatible with pneumonia, worsening since prior study. No evidence of pulmonary embolus. Right hepatic lobe mass again noted as seen on recent imaging and cancer staging. Coronary artery disease. Aortic Atherosclerosis (ICD10-I70.0). Electronically Signed   By: Charlett Nose M.D.   On: 02/01/2023 15:46   CT CHEST ABDOMEN PELVIS W CONTRAST  Result Date: 01/23/2023 CLINICAL DATA:   Cholangiocarcinoma * Tracking Code: BO * EXAM: CT CHEST, ABDOMEN, AND PELVIS WITH CONTRAST TECHNIQUE: Multidetector CT imaging of the chest, abdomen and pelvis was performed following the standard protocol during bolus administration of intravenous contrast. RADIATION DOSE REDUCTION: This exam was performed according to the departmental dose-optimization program which includes automated exposure control, adjustment of the mA and/or kV according to patient size and/or use of iterative reconstruction technique. CONTRAST:  OMNIPAQUE IOHEXOL 300 MG/ML  SOLN COMPARISON:  PET-CT, 12/10/2022 FINDINGS: CT CHEST FINDINGS Cardiovascular: Right chest port catheter. Normal heart size. Scattered left and right coronary artery calcifications. No pericardial effusion. Mediastinum/Nodes: Unchanged prominent high right paratracheal lymph nodes (series 2, image 97). Unchanged heterogeneous left lobe thyroid goiter, no further follow-up or characterization is required in the setting of known metastatic primary malignancy (series 2, image 7). In the setting of significant comorbidities or limited life expectancy, no follow-up recommended (ref: J Am Coll Radiol. 2015 Feb;12(2): 143-50). Trachea, and esophagus demonstrate no significant findings. Lungs/Pleura: Slightly improved, although still extensive ground-glass airspace opacity primarily seen throughout the left lower lobe and lingula (series 3, image 115). Minimal paraseptal emphysema. No pleural effusion or pneumothorax. Musculoskeletal: No chest wall abnormality. No acute osseous findings. CT ABDOMEN PELVIS FINDINGS Hepatobiliary: Hypodense lesion of the right lobe of the liver, hepatic segment VII/VIII is slightly diminished in size measuring 6.8 x 6.3 cm, previously 8.3 x 6.1 cm when measured similarly (series 2, image 67). No evident change in additional smaller hypodense lesions scattered throughout the right lobe of the liver, these more difficult to see on prior  noncontrast PET-CT, for example in the superior liver dome, hepatic segment VIII measuring 1.6 x 1.5 cm (series 2, image 62). No gallstones, gallbladder wall thickening, or biliary dilatation. Pancreas: Unremarkable. No pancreatic ductal dilatation or surrounding inflammatory changes. Spleen: Normal in size without significant abnormality. Adrenals/Urinary Tract: Adrenal glands are unremarkable. Unchanged benign, thinly septated and rim calcified cyst of the superior pole of the right kidney, as well as additional simple bilateral renal cortical cysts, for which no further follow-up or characterization is required. Kidneys are otherwise normal, without renal calculi, solid lesion, or hydronephrosis. Bladder is unremarkable. Stomach/Bowel: Stomach is within normal limits. Appendix appears normal. No evidence of bowel wall thickening, distention, or inflammatory changes. Vascular/Lymphatic: Aortic atherosclerosis. Similar enlarged celiac axis, gastrohepatic ligament, and retroperitoneal lymph nodes, largest gastrohepatic ligament node measuring 2.6 x 1.5 cm (series 2, image 67). Reproductive: Prostatomegaly. Other: No abdominal wall hernia or abnormality. No ascites. Musculoskeletal: No acute osseous findings. Status post left hip total arthroplasty. Similar appearance of a lytic metastasis of the right ischial tuberosity (series 2, image 141). Similar sclerotic metastasis of the posterior left ilium (series 2, image 122). Similar mixed lytic and sclerotic metastasis of the left scapular body (series 2, image 9). Similar lytic metastasis of the right T1 transverse process (series  2, image 4). IMPRESSION: 1. Hypodense lesion of the right lobe of the liver, hepatic segment VII/VIII is slightly diminished in size, consistent with treatment response. 2. No evident change in additional smaller hypodense lesions scattered throughout the right lobe of the liver, these more difficult to see on prior noncontrast PET-CT. 3.  Unchanged enlarged celiac axis, gastrohepatic ligament, and retroperitoneal lymph nodes. Unchanged prominent high right paratracheal lymph nodes. 4. Unchanged osseous metastases. 5. Slightly improved, although still extensive ground-glass airspace opacity primarily seen throughout the left lower lobe and lingula, nonspecific and infectious or inflammatory. 6. Coronary artery disease. 7. Emphysema. Aortic Atherosclerosis (ICD10-I70.0) and Emphysema (ICD10-J43.9). Electronically Signed   By: Jearld Lesch M.D.   On: 01/23/2023 08:56

## 2023-02-13 NOTE — ED Triage Notes (Signed)
Pt via POV from cancer center. Pt was going for routine blood work. States that they took his VS and HR was in the 140s. Cancer center did an EKG and reports a fib RVR, pt denies of irregular heart rhythm. States he does take Eliquis for DVT and started that last week. States that he has been feeling faint.   Cancer center did do blood work this AM. [name] is accessed. Pt asking to wait to talk to the doctor for chest XR.

## 2023-02-13 NOTE — Assessment & Plan Note (Signed)
He follows up with pulmonology.  DDX includes organizing pneumonia, ILD, aspiration, lymphatic spread, etc. He declined repeat bronchoscopy/biopsy.  Worse infiltrates on most recent CT scan.  Continue to monitor.

## 2023-02-13 NOTE — Assessment & Plan Note (Addendum)
Imaging findings and pathology results were reviewed with patient.  Clinical diagnosis is cholangiocarcinoma, although atypical with both normal CEA and CA 19.9 Interim PET scan after 3 cycles of treatment showed mixed response- decreased hypermetabolism but increased size.   He has had new bone lesions. Refer to radiation oncology for feasibility of treating new bone lesions.  For now I will continue current regimen with short-term repeat imaging. Labs are reviewed and discussed with patient. S/p 6 cycles  Gemcitabine, Cisplatin and Durvalumab.  Overall he tolerates very well.  Hold off treatment today due to tachycardia. Consider repeat Abdomen Pelvis for treatment response assessment.    NGS-[facility] liquid biopsy FGFR2 -ADK mutation, TMB 0,  2nd line options include Futibatinib, or Pemigatinib or Infigratinib-discussed about NGS results with [facility] oncology Dr. Hulen Luster

## 2023-02-14 ENCOUNTER — Inpatient Hospital Stay: Payer: BC Managed Care – PPO

## 2023-02-14 ENCOUNTER — Encounter: Payer: Self-pay | Admitting: Internal Medicine

## 2023-02-14 ENCOUNTER — Ambulatory Visit: Payer: BC Managed Care – PPO

## 2023-02-14 DIAGNOSIS — I824Y3 Acute embolism and thrombosis of unspecified deep veins of proximal lower extremity, bilateral: Secondary | ICD-10-CM | POA: Diagnosis not present

## 2023-02-14 DIAGNOSIS — D6861 Antiphospholipid syndrome: Secondary | ICD-10-CM

## 2023-02-14 DIAGNOSIS — C221 Intrahepatic bile duct carcinoma: Secondary | ICD-10-CM | POA: Diagnosis not present

## 2023-02-14 DIAGNOSIS — I5031 Acute diastolic (congestive) heart failure: Secondary | ICD-10-CM

## 2023-02-14 DIAGNOSIS — I4892 Unspecified atrial flutter: Secondary | ICD-10-CM | POA: Diagnosis not present

## 2023-02-14 DIAGNOSIS — J90 Pleural effusion, not elsewhere classified: Secondary | ICD-10-CM

## 2023-02-14 LAB — BASIC METABOLIC PANEL
Anion gap: 9 (ref 5–15)
BUN: 6 mg/dL — ABNORMAL LOW (ref 8–23)
CO2: 25 mmol/L (ref 22–32)
Calcium: 8 mg/dL — ABNORMAL LOW (ref 8.9–10.3)
Chloride: 104 mmol/L (ref 98–111)
Creatinine, Ser: 0.76 mg/dL (ref 0.61–1.24)
GFR, Estimated: >60 mL/min (ref >60)
Glucose, Bld: 118 mg/dL — ABNORMAL HIGH (ref 70–99)
Potassium: 3.6 mmol/L (ref 3.5–5.1)
Sodium: 138 mmol/L (ref 135–145)

## 2023-02-14 LAB — CBC
HCT: 26.8 % — ABNORMAL LOW (ref 39.0–52.0)
Hemoglobin: 8.3 g/dL — ABNORMAL LOW (ref 13.0–17.0)
MCH: 32.2 pg (ref 26.0–34.0)
MCHC: 31 g/dL (ref 30.0–36.0)
MCV: 103.9 fL — ABNORMAL HIGH (ref 80.0–100.0)
Platelets: 212 10*3/uL (ref 150–400)
RBC: 2.58 MIL/uL — ABNORMAL LOW (ref 4.22–5.81)
RDW: 15.9 % — ABNORMAL HIGH (ref 11.5–15.5)
WBC: 2.6 10*3/uL — ABNORMAL LOW (ref 4.0–10.5)
nRBC: 0 % (ref 0.0–0.2)

## 2023-02-14 LAB — ECHOCARDIOGRAM COMPLETE
S' Lateral: 3.6 cm
Weight: 4945.6 oz

## 2023-02-14 LAB — CBG MONITORING, ED
Glucose-Capillary: 128 mg/dL — ABNORMAL HIGH (ref 70–99)
Glucose-Capillary: 191 mg/dL — ABNORMAL HIGH (ref 70–99)

## 2023-02-14 LAB — GLUCOSE, CAPILLARY
Glucose-Capillary: 170 mg/dL — ABNORMAL HIGH (ref 70–99)
Glucose-Capillary: 156 mg/dL — ABNORMAL HIGH (ref 70–99)

## 2023-02-14 LAB — T4, FREE: Free T4: 1.01 ng/dL (ref 0.61–1.12)

## 2023-02-14 MED ORDER — ENOXAPARIN SODIUM 150 MG/ML IJ SOSY
140.0000 mg | PREFILLED_SYRINGE | Freq: Two times a day (BID) | INTRAMUSCULAR | Status: DC
  Administered 2023-02-14 – 2023-02-15 (×2): 140 mg via SUBCUTANEOUS
  Filled 2023-02-14 (×4): qty 0.94

## 2023-02-14 NOTE — Progress Notes (Signed)
PROGRESS NOTE    Christopher Marsh   ZOX:[id number] DOB: 07/20/1961  DOA: 02/13/2023 Date of Service: 02/14/23 PCP: Nelwyn Salisbury, MD     Brief Narrative / Hospital Course:  Christopher Marsh is a 62 y.o. male with medical history significant of antiphospholipid syndrome with recurrent DVTs on full dose of Lovenox, stage IV cholangiocarcinoma with bone metastasis on chemotherapy and with XRT to bone lesions, HTN, HLD, DM, who presents to ED from oncology clinic on 02/13/2023 with tachycardia and SOB. EKG done in office showed A flutter with variable A-V block.  Heart rate up to 140.  04/17:  pt was found to have BNP 119.8, WBC 3.7, hemoglobin 8.5 (8.6 on 02/10/2023, and 15.4 on 11/08/2022), GFR> 60, troponin level 11, potassium 3.4, temperature normal, blood pressure 149/81, RR 21, oxygen saturation 99% on room air.  CTA: no PE, (+)cardiomegaly and CAD. Patient is admitted to PCU as inpatient.  Dr. Cathie Hoops of oncology and Dr. Okey Dupre of cardiology are consulted. HR improved to 90s. Metoprolol tartrate 12.5 mg q6h, IV BB prn, Echo ordered as well as diuresis. 04/18: Echo no overt CHF - EF 50-55 but LV low normal fxn no RWMA and indeterminate diastolic parameters. Telemetry no concerns. Cardiology recs adjustment for beta blocker to metoprolol succinate 50 mg bid. Onc to see today. Adjusting Lovenox - he was not using bid, pharmacy involved to get levels tonight and adjust dose as needed but goal about 140 mg bid    Consultants:  Cardiology Oncology  Procedures: None       ASSESSMENT & PLAN:   Principal Problem:   Atrial flutter with rapid ventricular response Active Problems:   Cholangiocarcinoma   Antiphospholipid syndrome   DVT (deep venous thrombosis)   Hyperlipemia   Essential hypertension   Hypomagnesemia   Hypokalemia   Macrocytic anemia   Diabetes mellitus without complication   SOB (shortness of breath)   Obesity (BMI 30-39.9)  Atrial flutter with rapid ventricular response:   HT up to 140s --> 90s now.  Cardiology to adjust rate control, consider rhythm control Metoprolol titrate, 12.5 mg every 6 hours --> 50 mg bid  As needed IV metoprolol 5 mg every 2 hour for heart rate> 125 Plan subcutaneous Lovenox 140 mg twice daily which is also for DVT   Cholangiocarcinoma:  pt is on chemotherapy consulted Dr. Cathie Hoops of oncology.   Antiphospholipid syndrome and DVT (deep venous thrombosis) continue home Lovenox 140 mg twice daily consulted Dr. Cathie Hoops of oncology.   Hyperlipemia Pravastatin   Essential hypertension IV hydralazine as needed Metoprolol Amlodipine   Hypomagnesemia and hypokalemia:  Potassium 3.4, magnesium 1.8 Repleted both Check phosphorus level --> 3.3   Macrocytic anemia:  Hemoglobin stable 8.5 (8.6 on 02/11/2023) Follow-up with CBC   Diabetes mellitus without complication:  Recent A1c 5.8.  Well-controlled.   Patient taking metformin and glipizide at home Sliding scale insulin   SOB (shortness of breath): improved  Etiology is not clear.  CTA negative for PE.  No signs of pneumonia.  BNP 119.  NO overt CHF on Echo   Obesity (BMI 30-39.9): Body weight 140.2 kg, BMI 38.63 Encourage losing weight Exercise and healthy diet           DVT prophylaxis: tx w/ full dose Lovenox  Pertinent IV fluids/nutrition: no continuous IV fluids  Central lines / invasive devices: R chest port   Code Status: FULL CODE per H&P ACP documents reviewed: none on file   Current Admission Status:  inpatient  TOC needs / Dispo plan: TBD, anticipate previous home environment Barriers to discharge / significant pending items: HR stabilization, cardiology recs, anticipate 1-2 more days inptatient              Subjective / Brief ROS:  Patient reports improvement compared to yesterday LE edema better but not resolved  Denies CP/SOB.  Pain controlled.  Denies new weakness.  Tolerating diet.  Reports no concerns w/ urination/defecation.   Family  Communication: wife is at bedside on rounds     Objective Findings:  Vitals:   02/14/23 0853 02/14/23 0900 02/14/23 1112 02/14/23 1239  BP:  (!) 145/75 127/63   Pulse:  94 95   Resp:  18 19   Temp: 98.2 F (36.8 C)   98.1 F (36.7 C)  TempSrc: Oral   Oral  SpO2:  96% 96%     Intake/Output Summary (Last 24 hours) at 02/14/2023 1408 Last data filed at 02/14/2023 0247 Gross per 24 hour  Intake 93.81 ml  Output --  Net 93.81 ml   There were no vitals filed for this visit.  Examination:  Physical Exam Constitutional:      General: He is not in acute distress. Cardiovascular:     Rate and Rhythm: Normal rate. Rhythm irregular.  Pulmonary:     Effort: Pulmonary effort is normal. No respiratory distress.  Musculoskeletal:     Right lower leg: Edema present.     Left lower leg: Edema present.  Skin:    General: Skin is warm.     Findings: Erythema (bilateral LE) present.  Neurological:     General: No focal deficit present.     Mental Status: He is alert and oriented to person, place, and time.  Psychiatric:        Mood and Affect: Mood normal.        Behavior: Behavior normal.          Scheduled Medications:   calcium-vitamin D  1 tablet Oral Daily   enoxaparin  140 mg Subcutaneous Q12H   furosemide  40 mg Intravenous BID   insulin aspart  0-5 Units Subcutaneous QHS   insulin aspart  0-9 Units Subcutaneous TID WC   magnesium chloride  1 tablet Oral BID   metoprolol tartrate  12.5 mg Oral Q6H   pravastatin  20 mg Oral Daily    Continuous Infusions:   PRN Medications:  acetaminophen, albuterol, dextromethorphan-guaiFENesin, hydrALAZINE, LORazepam, metoprolol tartrate, ondansetron (ZOFRAN) IV, triamcinolone cream  Antimicrobials from admission:  Anti-infectives (From admission, onward)    None           Data Reviewed:  I have personally reviewed the following...  CBC: Recent Labs  Lab 02/11/23 1130 02/13/23 0808 02/14/23 0540  WBC 2.4*  3.7* 2.6*  NEUTROABS 1.7 2.5  --   HGB 8.6* 8.5* 8.3*  HCT 27.0* 27.0* 26.8*  MCV 102.7* 103.8* 103.9*  PLT 88* 186 212   Basic Metabolic Panel: Recent Labs  Lab 02/11/23 1129 02/13/23 0808 02/13/23 1010 02/14/23 0540  NA 136 137  --  138  K 3.6 3.4*  --  3.6  CL 103 106  --  104  CO2 22 23  --  25  GLUCOSE 107* 113*  --  118*  BUN 7* 7*  --  6*  CREATININE 0.84 0.89  --  0.76  CALCIUM 8.7* 8.3*  --  8.0*  MG 1.6* 1.5*  --   --   PHOS  --   --  3.3  --    GFR: Estimated Creatinine Clearance: 142.8 mL/min (by C-G formula based on SCr of 0.76 mg/dL). Liver Function Tests: Recent Labs  Lab 02/11/23 1129 02/13/23 0808  AST 26 24  ALT 17 16  ALKPHOS 50 52  BILITOT 0.6 0.4  PROT 6.2* 6.0*  ALBUMIN 3.4* 3.5   No results for input(s): "LIPASE", "AMYLASE" in the last 168 hours. No results for input(s): "AMMONIA" in the last 168 hours. Coagulation Profile: No results for input(s): "INR", "PROTIME" in the last 168 hours. Cardiac Enzymes: No results for input(s): "CKTOTAL", "CKMB", "CKMBINDEX", "TROPONINI" in the last 168 hours. BNP (last 3 results) No results for input(s): "PROBNP" in the last 8760 hours. HbA1C: No results for input(s): "HGBA1C" in the last 72 hours. CBG: Recent Labs  Lab 02/13/23 1637 02/14/23 0745 02/14/23 1217  GLUCAP 153* 128* 191*   Lipid Profile: No results for input(s): "CHOL", "HDL", "LDLCALC", "TRIG", "CHOLHDL", "LDLDIRECT" in the last 72 hours. Thyroid Function Tests: Recent Labs    02/13/23 1010 02/14/23 0540  TSH 0.269*  --   FREET4  --  1.01   Anemia Panel: No results for input(s): "VITAMINB12", "FOLATE", "FERRITIN", "TIBC", "IRON", "RETICCTPCT" in the last 72 hours. Most Recent Urinalysis On File:     Component Value Date/Time   COLORURINE YELLOW 09/15/2022 1549   APPEARANCEUR CLEAR 09/15/2022 1549   LABSPEC 1.020 09/15/2022 1549   PHURINE 5.0 09/15/2022 1549   GLUCOSEU NEGATIVE 09/15/2022 1549   HGBUR NEGATIVE  09/15/2022 1549   HGBUR negative 11/23/2009 0844   BILIRUBINUR NEGATIVE 09/15/2022 1549   BILIRUBINUR 1+ 11/18/2017 1600   KETONESUR NEGATIVE 09/15/2022 1549   PROTEINUR NEGATIVE 09/15/2022 1549   UROBILINOGEN 4.0 (A) 11/18/2017 1600   UROBILINOGEN 0.2 11/23/2009 0844   NITRITE NEGATIVE 09/15/2022 1549   LEUKOCYTESUR NEGATIVE 09/15/2022 1549   Sepsis Labs: (procalcitonin:4,lacticidven:4) Microbiology: No results found for this or any previous visit (from the past 240 hour(s)).    Radiology Studies last 3 days: ECHOCARDIOGRAM COMPLETE  Result Date: 02/14/2023    ECHOCARDIOGRAM REPORT   Patient Name:   Christopher Marsh Date of Exam: 02/13/2023 Medical Rec #:  [id number]        Height:       75.0 in Accession #:    [id number]       Weight:       309.1 lb Date of Birth:  September 03, 1961         BSA:          2.640 m Patient Age:    61 years         BP:           151/77 mmHg Patient Gender: M                HR:           95 bpm. Exam Location:  [facility] Procedure: 2D Echo, Cardiac Doppler and Color Doppler Indications:     R06.00 Dyspnea  History:         Patient has prior history of Echocardiogram examinations, most                  recent 08/19/2022. Signs/Symptoms:Dyspnea; Risk                  Factors:Hypertension, Dyslipidemia and Diabetes. Lower                  extremity edema.  Sonographer:     NaTashia Rodgers-Jones RDCS  Referring Phys:  [id number] SHERI HAMMOCK Diagnosing Phys: Julien Nordmann MD IMPRESSIONS  1. Left ventricular ejection fraction, by estimation, is 50 to 55%. The left ventricle has low normal function. The left ventricle has no regional wall motion abnormalities. Left ventricular diastolic parameters are indeterminate.  2. Right ventricular systolic function is normal. The right ventricular size is normal. Tricuspid regurgitation signal is inadequate for assessing PA pressure.  3. The mitral valve is normal in structure. Mild mitral valve regurgitation. No evidence of mitral  stenosis.  4. The aortic valve has an indeterminant number of cusps. Aortic valve regurgitation is not visualized. Aortic valve sclerosis is present, with no evidence of aortic valve stenosis.  5. The inferior vena cava is dilated in size with <50% respiratory variability, suggesting right atrial pressure of 15 mmHg. FINDINGS  Left Ventricle: Left ventricular ejection fraction, by estimation, is 50 to 55%. The left ventricle has low normal function. The left ventricle has no regional wall motion abnormalities. The left ventricular internal cavity size was normal in size. There is no left ventricular hypertrophy. Left ventricular diastolic parameters are indeterminate. Right Ventricle: The right ventricular size is normal. No increase in right ventricular wall thickness. Right ventricular systolic function is normal. Tricuspid regurgitation signal is inadequate for assessing PA pressure. Left Atrium: Left atrial size was normal in size. Right Atrium: Right atrial size was normal in size. Pericardium: There is no evidence of pericardial effusion. Mitral Valve: The mitral valve is normal in structure. Mild mitral valve regurgitation. No evidence of mitral valve stenosis. Tricuspid Valve: The tricuspid valve is normal in structure. Tricuspid valve regurgitation is not demonstrated. No evidence of tricuspid stenosis. Aortic Valve: The aortic valve has an indeterminant number of cusps. Aortic valve regurgitation is not visualized. Aortic valve sclerosis is present, with no evidence of aortic valve stenosis. Pulmonic Valve: The pulmonic valve was normal in structure. Pulmonic valve regurgitation is not visualized. No evidence of pulmonic stenosis. Aorta: The aortic root is normal in size and structure. Venous: The inferior vena cava is dilated in size with less than 50% respiratory variability, suggesting right atrial pressure of 15 mmHg. IAS/Shunts: No atrial level shunt detected by color flow Doppler.  LEFT VENTRICLE  PLAX 2D LVIDd:         5.10 cm LVIDs:         3.60 cm LV PW:         0.80 cm LV IVS:        0.80 cm LVOT diam:     2.30 cm LV SV:         98 LV SV Index:   37 LVOT Area:     4.15 cm  RIGHT VENTRICLE             IVC RV Basal diam:  3.90 cm     IVC diam: 2.90 cm RV S prime:     16.80 cm/s TAPSE (M-mode): 2.2 cm LEFT ATRIUM             Index        RIGHT ATRIUM           Index LA diam:        4.80 cm 1.82 cm/m   RA Area:     19.10 cm LA Vol (A2C):   59.9 ml 22.69 ml/m  RA Volume:   52.80 ml  20.00 ml/m LA Vol (A4C):   75.2 ml 28.48 ml/m LA Biplane Vol: 70.3 ml 26.62 ml/m  AORTIC VALVE LVOT  Vmax:   133.00 cm/s LVOT Vmean:  96.950 cm/s LVOT VTI:    0.236 m  AORTA Ao Root diam: 3.90 cm Ao Asc diam:  3.30 cm MV E velocity: 126.25 cm/s                             SHUNTS                             Systemic VTI:  0.24 m                             Systemic Diam: 2.30 cm Julien Nordmann MD Electronically signed by Julien Nordmann MD Signature Date/Time: 02/14/2023/11:51:33 AM    Final    CT Angio Chest PE W and/or Wo Contrast  Result Date: 02/13/2023 CLINICAL DATA:  Pulmonary embolism suspected. High probability DVT. Dyspnea. EXAM: CT ANGIOGRAPHY CHEST WITH CONTRAST TECHNIQUE: Multidetector CT imaging of the chest was performed using the standard protocol during bolus administration of intravenous contrast. Multiplanar CT image reconstructions and MIPs were obtained to evaluate the vascular anatomy. RADIATION DOSE REDUCTION: This exam was performed according to the departmental dose-optimization program which includes automated exposure control, adjustment of the mA and/or kV according to patient size and/or use of iterative reconstruction technique. CONTRAST:  50mL OMNIPAQUE IOHEXOL 350 MG/ML SOLN COMPARISON:  02/01/2023 FINDINGS: Cardiovascular: Heart is mildly enlarged. No pericardial effusion. There are coronary artery calcifications. The pulmonary arteries are moderately well opacified and there is no acute  pulmonary embolus. RIGHT-sided port tip extends to the level of the LOWER superior vena cava/UPPER RIGHT atrium. Mediastinum/Nodes: The LEFT lobe of the thyroid gland is enlarged and nodular, consistent with goiter. The appearance is unchanged. Esophagus is unremarkable. There are scattered sub centimeter mediastinal and axillary lymph nodes. Lungs/Pleura: Airways are patent. There are bilateral pleural effusions. LEFT LOWER lobe airspace filling and atelectasis are noted, slightly improved compared to prior study. There has been improvement in RIGHT UPPER and LOWER lobe aeration compared to prior study. Upper Abdomen: There is marked heterogeneous appearance of the liver. The mass previously noted in the RIGHT hepatic lobe is estimated to measure 7.7 centimeters. Cirrhotic morphology of the liver. There is adenopathy in the gastrohepatic ligament, largest node measuring 1 6 centimeters in short axis. Musculoskeletal: There are degenerative changes in the spine. A sclerotic lesion in the UPPER sternum is 7 millimeters, suspicious for metastatic disease. Review of the MIP images confirms the above findings. IMPRESSION: 1. Technically adequate exam showing no acute pulmonary embolus. 2. Cardiomegaly. Coronary artery calcifications. 3. Bilateral pleural effusions. 4. Improved aeration of the RIGHT UPPER and LOWER lobes compared to prior study. 5. LEFT LOWER lobe and lingula airspace filling and atelectasis, slightly improved compared to prior study. 6. Cirrhotic morphology of the liver. 7.7 centimeters mass in the RIGHT hepatic lobe. 7. Gastrohepatic ligament adenopathy. 8. A 7 millimeters sclerotic lesion in the UPPER sternum is suspicious for metastatic disease. 9. LEFT thyroid goiter. Electronically Signed   By: Norva Pavlov M.D.   On: 02/13/2023 12:09   DG Chest 2 View  Result Date: 02/11/2023 CLINICAL DATA:  SOB EXAM: CHEST - 2 VIEW COMPARISON:  09/15/2022 FINDINGS: Hyperinflated lungs consistent with  COPD. Enlarged cardiac silhouette. Bibasilar minimal consolidation volume loss. Bilateral small pleural effusions. Thoracic degenerative changes. Right-sided Port-A-Cath tip distal SVC. IMPRESSION: Enlarged cardiac silhouette. Hyperinflated  lungs consistent with COPD. Small pleural effusions and minimal bibasilar consolidation. Electronically Signed   By: Layla Maw M.D.   On: 02/11/2023 15:22   US Venous Img Lower Bilateral  Result Date: 02/11/2023 CLINICAL DATA:  History of DVT, bilateral lower extremity edema EXAM: BILATERAL LOWER EXTREMITY VENOUS DOPPLER ULTRASOUND TECHNIQUE: Gray-scale sonography with compression, as well as color and duplex ultrasound, were performed to evaluate the deep venous system(s) from the level of the common femoral vein through the popliteal and proximal calf veins. COMPARISON:  None Available. FINDINGS: VENOUS Normal compressibility of the common femoral, and superficial femoral veins. There is nonocclusive thrombus identified within the popliteal veins bilaterally. The posterior tibial veins of the right catheter largely occluded. Left posterior tibial veins are patent. Visualized portions of profunda femoral vein and great saphenous vein unremarkable. Doppler waveforms show normal direction of venous flow, normal respiratory plasticity and response to augmentation. OTHER None. Limitations: none IMPRESSION: 1. Nonocclusive DVT within the popliteal veins bilaterally. 2. Occlusive DVT within the posterior tibial veins of the right calf. 3. These results will be called to the ordering clinician or representative by the Radiologist Assistant, and communication documented in the PACS or [employer]. Electronically Signed   By: Helyn Numbers M.D.   On: 02/11/2023 15:18             LOS: 1 day      Sunnie Nielsen, DO [facility] 02/14/2023, 2:08 PM    Dictation software may have been used to generate the above note. Typos may occur and escape  review in typed/dictated notes. Please contact Dr Lyn Hollingshead directly for clarity if needed.  Staff may message me via secure chat in Epic  but this may not receive an immediate response,  please page me for urgent matters!  If 7PM-7AM, please contact night coverage [email or web address]

## 2023-02-14 NOTE — Hospital Course (Addendum)
Christopher Marsh is a 62 y.o. male with medical history significant of antiphospholipid syndrome with recurrent DVTs on full dose of Lovenox, stage IV cholangiocarcinoma with bone metastasis on chemotherapy and with XRT to bone lesions, HTN, HLD, DM, who presents to ED from oncology clinic on 02/13/2023 with tachycardia and SOB. EKG done in office showed A flutter with variable A-V block.  Heart rate up to 140.  04/17:  pt was found to have BNP 119.8, WBC 3.7, hemoglobin 8.5 (8.6 on 02/10/2023, and 15.4 on 11/08/2022), GFR> 60, troponin level 11, potassium 3.4, temperature normal, blood pressure 149/81, RR 21, oxygen saturation 99% on room air.  CTA: no PE, (+)cardiomegaly and CAD. Patient is admitted to PCU as inpatient.  Dr. Cathie Hoops of oncology and Dr. Okey Dupre of cardiology are consulted. HR improved to 90s. Metoprolol tartrate 12.5 mg q6h, IV BB prn, Echo ordered as well as diuresis. 04/18: Echo no overt CHF - EF 50-55 but LV low normal fxn no RWMA and indeterminate diastolic parameters. Telemetry no concerns. Cardiology recs adjustment for beta blocker to metoprolol succinate 50 mg bid. Onc to see today. Adjusting Lovenox - he was not using bid, pharmacy involved to get levels tonight and adjust dose as needed but goal about 140 mg bid    Consultants:  Cardiology Oncology  Procedures: None       ASSESSMENT & PLAN:   Principal Problem:   Atrial flutter with rapid ventricular response Active Problems:   Cholangiocarcinoma   Antiphospholipid syndrome   DVT (deep venous thrombosis)   Hyperlipemia   Essential hypertension   Hypomagnesemia   Hypokalemia   Macrocytic anemia   Diabetes mellitus without complication   SOB (shortness of breath)   Obesity (BMI 30-39.9)  Atrial flutter with rapid ventricular response:  HT up to 140s --> 90s now.  Cardiology to adjust rate control, consider rhythm control Metoprolol titrate, 12.5 mg every 6 hours --> 50 mg bid  As needed IV metoprolol 5 mg every 2  hour for heart rate> 125 Plan subcutaneous Lovenox 140 mg twice daily which is also for DVT   Cholangiocarcinoma:  pt is on chemotherapy consulted Dr. Cathie Hoops of oncology.   Antiphospholipid syndrome and DVT (deep venous thrombosis) continue home Lovenox 140 mg twice daily consulted Dr. Cathie Hoops of oncology.   Hyperlipemia Pravastatin   Essential hypertension IV hydralazine as needed Metoprolol Amlodipine   Hypomagnesemia and hypokalemia:  Potassium 3.4, magnesium 1.8 Repleted both Check phosphorus level --> 3.3   Macrocytic anemia:  Hemoglobin stable 8.5 (8.6 on 02/11/2023) Follow-up with CBC   Diabetes mellitus without complication:  Recent A1c 5.8.  Well-controlled.   Patient taking metformin and glipizide at home Sliding scale insulin   SOB (shortness of breath): improved  Etiology is not clear.  CTA negative for PE.  No signs of pneumonia.  BNP 119.  NO overt CHF on Echo   Obesity (BMI 30-39.9): Body weight 140.2 kg, BMI 38.63 Encourage losing weight Exercise and healthy diet           DVT prophylaxis: tx w/ full dose Lovenox  Pertinent IV fluids/nutrition: no continuous IV fluids  Central lines / invasive devices: R chest port   Code Status: FULL CODE per H&P ACP documents reviewed: none on file   Current Admission Status: inpatient  TOC needs / Dispo plan: TBD, anticipate previous home environment Barriers to discharge / significant pending items: HR stabilization, cardiology recs, anticipate 1-2 more days inptatient

## 2023-02-14 NOTE — Consult Note (Signed)
Hematology/Oncology Consult note [phone] [phone]      Patient Care Team: Nelwyn Salisbury, MD as PCP - General (Family Medicine) Wyline Mood Alben Spittle, MD as PCP - Cardiology (Cardiology) [name], MD as Consulting Physician (Hematology and Oncology) Georgiana Spinner, NP as Nurse Practitioner (Vascular Surgery) Benita Gutter, RN as Oncology Nurse Navigator   Name of the patient: Christopher Marsh  [id number]  October 16, 1961   REASON FOR COSULTATION:  Cholangiocarcinoma, antiphospholipid syndrome, DVT History of presenting illness-  62 y.o. male with PMH listed at below who sent by me from clinic to emergency room due to tachycardia with heart rate of 140, EKG done at our clinic showed atrial fibrillation with rapid ventricular response. Patient has history of cholangiocarcinoma, on palliative chemotherapy.  He also has a history of antiphospholipid syndrome, history of DVT and supposed to take Lovenox 120 mg twice daily, recently self decreased to 120 mg daily after epistaxis. He has worsening of bilateral lower extremity swelling. 02/11/2023, bilateral lower extremity ultrasound showed nonocclusive DVT within the popliteal veins bilaterally.  Occlusive DVT within the posterior tibial veins of the right calf.  In the emergency room, troponin was negative, EKG showed A flutter with variable AV block.  Cardiology was consulted.  Repeat CTA showed no PE.  Liver mass increased in size to 7.7 cm.  Left lower lobe and lingular airspace filling and atelectasis slightly improved compared to prior study.  Improved aeration of the right upper and lower lobe comparing to prior study.  Patient was started on metoprolol.  Lovenox was continued at 120 mg twice daily.  Cardiology recommends Lasix, metoprolol for rate control holding amlodipine due to leg swelling.  Possible outpatient cardioversion.    No Known Allergies  Patient Active Problem List   Diagnosis Date Noted    Cholangiocarcinoma 08/27/2022    Priority: High   Tachycardia 02/13/2023    Priority: Medium    Antineoplastic chemotherapy induced anemia 01/23/2023    Priority: Medium    Hypomagnesemia 01/02/2023    Priority: Medium    Metastasis to bone 12/12/2022    Priority: Medium    Lung infiltrate on CT 10/10/2022    Priority: Medium    Hypocalcemia 10/10/2022    Priority: Medium    Antiphospholipid syndrome 08/30/2022    Priority: Medium    Extravasation accident 01/02/2023    Priority: Low   Sore throat 11/21/2022    Priority: Low   Rhinitis 11/21/2022    Priority: Low   Hyperkalemia 11/21/2022    Priority: Low   Goals of care, counseling/discussion 08/30/2022    Priority: Low   Pleural effusion 02/14/2023   Acute diastolic CHF (congestive heart failure) 02/14/2023   Atrial flutter with rapid ventricular response 02/13/2023   DVT (deep venous thrombosis) 02/13/2023   Hypokalemia 02/13/2023   Obesity (BMI 30-39.9) 02/13/2023   Diabetes mellitus without complication 02/13/2023   Macrocytic anemia 02/13/2023   Dyspnea 02/13/2023   Encounter for antineoplastic chemotherapy 10/10/2022   Multifocal pneumonia 08/19/2022   Hepatic lesion 08/19/2022   Snoring 08/19/2022   Primary insomnia 08/19/2022   Liver mass, right lobe 08/19/2022   Pneumonia 08/18/2022   Lymphedema 04/20/2022   Erythrocytosis 12/18/2021   COVID-19 virus infection 09/01/2020   Status post total replacement of hip 07/22/2017   Essential hypertension 08/28/2016   Actinic keratosis 11/30/2015   DM2 (diabetes mellitus, type 2) 02/15/2015   Morbid obesity (HCC) 06/18/2013   Left hip pain 04/02/2012   HSV (herpes simplex  virus) infection 04/02/2012   CONTACT DERMATITIS&OTHER ECZEMA DUE UNSPEC CAUSE 09/14/2009   ERECTILE DYSFUNCTION 12/08/2008   Venous (peripheral) insufficiency 12/08/2008   Hyperlipemia 11/24/2008   ESOPHAGITIS, REFLUX 11/18/2008     Past Medical History:  Diagnosis Date    Antiphospholipid antibody syndrome    Arthritis    Cancer    Diabetes mellitus without complication    DVT (deep venous thrombosis)    Lt leg   Dyspnea    Elevated lipids    Erythrocytosis    Hyperlipidemia    Hypertension    Lower extremity edema    Multinodular thyroid    Post-thrombotic syndrome      Past Surgical History:  Procedure Laterality Date   COLONOSCOPY WITH PROPOFOL N/A 09/23/2018   Per Dr. Norma Fredrickson, polyp, repeat in 3 yrs    EYE MUSCLE SURGERY     JOINT REPLACEMENT Left 07/22/2017   PORTA CATH INSERTION N/A 12/24/2022   Procedure: PORTA CATH INSERTION;  Surgeon: Annice Needy, MD;  Location: [facility];  Service: Cardiovascular;  Laterality: N/A;   TOTAL HIP ARTHROPLASTY Left 07/22/2017   Procedure: TOTAL HIP ARTHROPLASTY;  Surgeon: Donato Heinz, MD;  Location: [facility];  Service: Orthopedics;  Laterality: Left;    Social History   Socioeconomic History   Marital status: Married    Spouse name: Not on file   Number of children: Not on file   Years of education: Not on file   Highest education level: Not on file  Occupational History   Not on file  Tobacco Use   Smoking status: Former    Packs/day: 0.50    Years: 6.00    Additional pack years: 0.00    Total pack years: 3.00    Types: Cigarettes    Quit date: 31    Years since quitting: 42.3   Smokeless tobacco: Never   Tobacco comments:    Pt states when he did smoke he smoked at most 1 ppd. ALS 09/26/2022  Vaping Use   Vaping Use: Never used  Substance and Sexual Activity   Alcohol use: Yes    Comment: occ   Drug use: No   Sexual activity: Not on file  Other Topics Concern   Not on file  Social History Narrative   Not on file   Social Determinants of Health   Financial Resource Strain: Low Risk  (08/27/2022)   Overall Financial Resource Strain (CARDIA)    Difficulty of Paying Living Expenses: Not very hard  Food Insecurity: No Food Insecurity (02/14/2023)   Hunger Vital Sign     Worried About Running Out of Food in the Last Year: Never true    Ran Out of Food in the Last Year: Never true  Transportation Needs: No Transportation Needs (02/14/2023)   PRAPARE - Administrator, Civil Service (Medical): No    Lack of Transportation (Non-Medical): No  Physical Activity: Insufficiently Active (08/27/2022)   Exercise Vital Sign    Days of Exercise per Week: 3 days    Minutes of Exercise per Session: 30 min  Stress: Stress Concern Present (08/27/2022)   [employer] of Occupational Health - Occupational Stress Questionnaire    Feeling of Stress : Rather much  Social Connections: Socially Integrated (08/27/2022)   Social Connection and Isolation Panel [NHANES]    Frequency of Communication with Friends and Family: More than three times a week    Frequency of Social Gatherings with Friends and Family: More than  three times a week    Attends Religious Services: 1 to 4 times per year    Active Member of Clubs or Organizations: Yes    Attends Banker Meetings: Never    Marital Status: Married  Catering manager Violence: Not At Risk (02/14/2023)   Humiliation, Afraid, Rape, and Kick questionnaire    Fear of Current or Ex-Partner: No    Emotionally Abused: No    Physically Abused: No    Sexually Abused: No     Family History  Problem Relation Age of Onset   Breast cancer Mother    Emphysema Father    Colon cancer Maternal Grandmother      Current Facility-Administered Medications:    acetaminophen (TYLENOL) tablet 650 mg, 650 mg, Oral, Q6H PRN, Lorretta Harp, MD   albuterol (PROVENTIL) (2.5 MG/3ML) 0.083% nebulizer solution 2.5 mg, 2.5 mg, Inhalation, Q4H PRN, Lorretta Harp, MD   calcium-vitamin D (OSCAL WITH D) 500-5 MG-MCG per tablet 1 tablet, 1 tablet, Oral, Daily, Lorretta Harp, MD, 1 tablet at 02/14/23 1112   dextromethorphan-guaiFENesin (MUCINEX DM) 30-600 MG per 12 hr tablet 1 tablet, 1 tablet, Oral, BID PRN, Lorretta Harp, MD   enoxaparin  (LOVENOX) injection 140 mg, 140 mg, Subcutaneous, Q12H, [name], MD   furosemide (LASIX) injection 40 mg, 40 mg, Intravenous, BID, Hammock, Sheri, NP, 40 mg at 02/14/23 0853   hydrALAZINE (APRESOLINE) injection 5 mg, 5 mg, Intravenous, Q2H PRN, Lorretta Harp, MD   insulin aspart (novoLOG) injection 0-5 Units, 0-5 Units, Subcutaneous, QHS, Lorretta Harp, MD, 0 Units at 02/14/23 0012   insulin aspart (novoLOG) injection 0-9 Units, 0-9 Units, Subcutaneous, TID WC, Lorretta Harp, MD, 2 Units at 02/14/23 1237   LORazepam (ATIVAN) tablet 0.5 mg, 0.5 mg, Oral, Q8H PRN, Lorretta Harp, MD   magnesium chloride (SLOW-MAG) 64 MG SR tablet 64 mg, 1 tablet, Oral, BID, Lorretta Harp, MD, 64 mg at 02/14/23 1111   metoprolol tartrate (LOPRESSOR) injection 5 mg, 5 mg, Intravenous, Q3H PRN, Lorretta Harp, MD   metoprolol tartrate (LOPRESSOR) tablet 12.5 mg, 12.5 mg, Oral, Q6H, Hammock, Sheri, NP, 12.5 mg at 02/14/23 1505   ondansetron (ZOFRAN) injection 4 mg, 4 mg, Intravenous, Q8H PRN, Lorretta Harp, MD   pravastatin (PRAVACHOL) tablet 20 mg, 20 mg, Oral, Daily, Lorretta Harp, MD, 20 mg at 02/14/23 1112   triamcinolone cream (KENALOG) 0.1 % cream 1 Application, 1 Application, Topical, TID PRN, Lorretta Harp, MD  Review of Systems  Constitutional:  Positive for fatigue. Negative for chills and fever.  HENT:   Negative for hearing loss and voice change.   Eyes:  Negative for eye problems and icterus.  Respiratory:  Positive for shortness of breath. Negative for chest tightness and cough.   Cardiovascular:  Positive for leg swelling. Negative for chest pain.  Gastrointestinal:  Negative for abdominal distention and abdominal pain.  Endocrine: Negative for hot flashes.  Genitourinary:  Negative for difficulty urinating, dysuria and frequency.   Musculoskeletal:  Negative for arthralgias.  Skin:  Negative for itching and rash.  Neurological:  Negative for light-headedness and numbness.  Hematological:  Negative for adenopathy. Does not  bruise/bleed easily.  Psychiatric/Behavioral:  Negative for confusion.     PHYSICAL EXAM Vitals:   02/14/23 1112 02/14/23 1239 02/14/23 1504 02/14/23 1624  BP: 127/63  (!) 144/75 129/69  Pulse: 95  97 98  Resp: 19  18   Temp:  98.1 F (36.7 C) 98 F (36.7 C) 98 F (36.7 C)  TempSrc:  Oral Oral Oral  SpO2: 96%  96% 98%   Physical Exam Constitutional:      General: He is not in acute distress.    Appearance: He is obese. He is not diaphoretic.  HENT:     Head: Normocephalic and atraumatic.     Nose: Nose normal.     Mouth/Throat:     Pharynx: No oropharyngeal exudate.  Eyes:     General: No scleral icterus.    Pupils: Pupils are equal, round, and reactive to light.  Cardiovascular:     Rate and Rhythm: Normal rate.     Heart sounds: No murmur heard. Pulmonary:     Effort: Pulmonary effort is normal. No respiratory distress.  Abdominal:     General: There is no distension.     Palpations: Abdomen is soft.     Tenderness: There is no abdominal tenderness.  Musculoskeletal:        General: Normal range of motion.     Cervical back: Normal range of motion and neck supple.     Right lower leg: Edema present.     Left lower leg: Edema present.  Skin:    General: Skin is warm and dry.     Findings: No erythema.  Neurological:     Mental Status: He is alert and oriented to person, place, and time.     Cranial Nerves: No cranial nerve deficit.     Motor: No abnormal muscle tone.     Coordination: Coordination normal.  Psychiatric:        Mood and Affect: Affect normal.       LABORATORY STUDIES    Latest Ref Rng & Units 02/14/2023    5:40 AM 02/13/2023    8:08 AM 02/11/2023   11:30 AM  CBC  WBC 4.0 - 10.5 K/uL 2.6  3.7  2.4   Hemoglobin 13.0 - 17.0 g/dL 8.3  8.5  8.6   Hematocrit 39.0 - 52.0 % 26.8  27.0  27.0   Platelets 150 - 400 K/uL 212  186  88       Latest Ref Rng & Units 02/14/2023    5:40 AM 02/13/2023    8:08 AM 02/11/2023   11:29 AM  CMP  Glucose 70  - 99 mg/dL 161  096  045   BUN 8 - 23 mg/dL 6  7  7    Creatinine 0.61 - 1.24 mg/dL 4.09  8.11  9.14   Sodium 135 - 145 mmol/L 138  137  136   Potassium 3.5 - 5.1 mmol/L 3.6  3.4  3.6   Chloride 98 - 111 mmol/L 104  106  103   CO2 22 - 32 mmol/L 25  23  22    Calcium 8.9 - 10.3 mg/dL 8.0  8.3  8.7   Total Protein 6.5 - 8.1 g/dL  6.0  6.2   Total Bilirubin 0.3 - 1.2 mg/dL  0.4  0.6   Alkaline Phos 38 - 126 U/L  52  50   AST 15 - 41 U/L  24  26   ALT 0 - 44 U/L  16  17      RADIOGRAPHIC STUDIES: I have personally reviewed the radiological images as listed and agreed with the findings in the report. ECHOCARDIOGRAM COMPLETE  Result Date: 02/14/2023    ECHOCARDIOGRAM REPORT   Patient Name:   RAJVEER HANDLER Date of Exam: 02/13/2023 Medical Rec #:  [id number]        Height:  75.0 in Accession #:    [id number]       Weight:       309.1 lb Date of Birth:  November 24, 1960         BSA:          2.640 m Patient Age:    61 years         BP:           151/77 mmHg Patient Gender: M                HR:           95 bpm. Exam Location:  [facility] Procedure: 2D Echo, Cardiac Doppler and Color Doppler Indications:     R06.00 Dyspnea  History:         Patient has prior history of Echocardiogram examinations, most                  recent 08/19/2022. Signs/Symptoms:Dyspnea; Risk                  Factors:Hypertension, Dyslipidemia and Diabetes. Lower                  extremity edema.  Sonographer:     Daphine Deutscher RDCS Referring Phys:  [id number] SHERI HAMMOCK Diagnosing Phys: Julien Nordmann MD IMPRESSIONS  1. Left ventricular ejection fraction, by estimation, is 50 to 55%. The left ventricle has low normal function. The left ventricle has no regional wall motion abnormalities. Left ventricular diastolic parameters are indeterminate.  2. Right ventricular systolic function is normal. The right ventricular size is normal. Tricuspid regurgitation signal is inadequate for assessing PA pressure.  3. The mitral valve is  normal in structure. Mild mitral valve regurgitation. No evidence of mitral stenosis.  4. The aortic valve has an indeterminant number of cusps. Aortic valve regurgitation is not visualized. Aortic valve sclerosis is present, with no evidence of aortic valve stenosis.  5. The inferior vena cava is dilated in size with <50% respiratory variability, suggesting right atrial pressure of 15 mmHg. FINDINGS  Left Ventricle: Left ventricular ejection fraction, by estimation, is 50 to 55%. The left ventricle has low normal function. The left ventricle has no regional wall motion abnormalities. The left ventricular internal cavity size was normal in size. There is no left ventricular hypertrophy. Left ventricular diastolic parameters are indeterminate. Right Ventricle: The right ventricular size is normal. No increase in right ventricular wall thickness. Right ventricular systolic function is normal. Tricuspid regurgitation signal is inadequate for assessing PA pressure. Left Atrium: Left atrial size was normal in size. Right Atrium: Right atrial size was normal in size. Pericardium: There is no evidence of pericardial effusion. Mitral Valve: The mitral valve is normal in structure. Mild mitral valve regurgitation. No evidence of mitral valve stenosis. Tricuspid Valve: The tricuspid valve is normal in structure. Tricuspid valve regurgitation is not demonstrated. No evidence of tricuspid stenosis. Aortic Valve: The aortic valve has an indeterminant number of cusps. Aortic valve regurgitation is not visualized. Aortic valve sclerosis is present, with no evidence of aortic valve stenosis. Pulmonic Valve: The pulmonic valve was normal in structure. Pulmonic valve regurgitation is not visualized. No evidence of pulmonic stenosis. Aorta: The aortic root is normal in size and structure. Venous: The inferior vena cava is dilated in size with less than 50% respiratory variability, suggesting right atrial pressure of 15 mmHg.  IAS/Shunts: No atrial level shunt detected by color flow Doppler.  LEFT VENTRICLE PLAX 2D LVIDd:  5.10 cm LVIDs:         3.60 cm LV PW:         0.80 cm LV IVS:        0.80 cm LVOT diam:     2.30 cm LV SV:         98 LV SV Index:   37 LVOT Area:     4.15 cm  RIGHT VENTRICLE             IVC RV Basal diam:  3.90 cm     IVC diam: 2.90 cm RV S prime:     16.80 cm/s TAPSE (M-mode): 2.2 cm LEFT ATRIUM             Index        RIGHT ATRIUM           Index LA diam:        4.80 cm 1.82 cm/m   RA Area:     19.10 cm LA Vol (A2C):   59.9 ml 22.69 ml/m  RA Volume:   52.80 ml  20.00 ml/m LA Vol (A4C):   75.2 ml 28.48 ml/m LA Biplane Vol: 70.3 ml 26.62 ml/m  AORTIC VALVE LVOT Vmax:   133.00 cm/s LVOT Vmean:  96.950 cm/s LVOT VTI:    0.236 m  AORTA Ao Root diam: 3.90 cm Ao Asc diam:  3.30 cm MV E velocity: 126.25 cm/s                             SHUNTS                             Systemic VTI:  0.24 m                             Systemic Diam: 2.30 cm Julien Nordmann MD Electronically signed by Julien Nordmann MD Signature Date/Time: 02/14/2023/11:51:33 AM    Final    CT Angio Chest PE W and/or Wo Contrast  Result Date: 02/13/2023 CLINICAL DATA:  Pulmonary embolism suspected. High probability DVT. Dyspnea. EXAM: CT ANGIOGRAPHY CHEST WITH CONTRAST TECHNIQUE: Multidetector CT imaging of the chest was performed using the standard protocol during bolus administration of intravenous contrast. Multiplanar CT image reconstructions and MIPs were obtained to evaluate the vascular anatomy. RADIATION DOSE REDUCTION: This exam was performed according to the departmental dose-optimization program which includes automated exposure control, adjustment of the mA and/or kV according to patient size and/or use of iterative reconstruction technique. CONTRAST:  50mL OMNIPAQUE IOHEXOL 350 MG/ML SOLN COMPARISON:  02/01/2023 FINDINGS: Cardiovascular: Heart is mildly enlarged. No pericardial effusion. There are coronary artery  calcifications. The pulmonary arteries are moderately well opacified and there is no acute pulmonary embolus. RIGHT-sided port tip extends to the level of the LOWER superior vena cava/UPPER RIGHT atrium. Mediastinum/Nodes: The LEFT lobe of the thyroid gland is enlarged and nodular, consistent with goiter. The appearance is unchanged. Esophagus is unremarkable. There are scattered sub centimeter mediastinal and axillary lymph nodes. Lungs/Pleura: Airways are patent. There are bilateral pleural effusions. LEFT LOWER lobe airspace filling and atelectasis are noted, slightly improved compared to prior study. There has been improvement in RIGHT UPPER and LOWER lobe aeration compared to prior study. Upper Abdomen: There is marked heterogeneous appearance of the liver. The mass previously noted in the RIGHT hepatic lobe is estimated to  measure 7.7 centimeters. Cirrhotic morphology of the liver. There is adenopathy in the gastrohepatic ligament, largest node measuring 1 6 centimeters in short axis. Musculoskeletal: There are degenerative changes in the spine. A sclerotic lesion in the UPPER sternum is 7 millimeters, suspicious for metastatic disease. Review of the MIP images confirms the above findings. IMPRESSION: 1. Technically adequate exam showing no acute pulmonary embolus. 2. Cardiomegaly. Coronary artery calcifications. 3. Bilateral pleural effusions. 4. Improved aeration of the RIGHT UPPER and LOWER lobes compared to prior study. 5. LEFT LOWER lobe and lingula airspace filling and atelectasis, slightly improved compared to prior study. 6. Cirrhotic morphology of the liver. 7.7 centimeters mass in the RIGHT hepatic lobe. 7. Gastrohepatic ligament adenopathy. 8. A 7 millimeters sclerotic lesion in the UPPER sternum is suspicious for metastatic disease. 9. LEFT thyroid goiter. Electronically Signed   By: Norva Pavlov M.D.   On: 02/13/2023 12:09   DG Chest 2 View  Result Date: 02/11/2023 CLINICAL DATA:  SOB  EXAM: CHEST - 2 VIEW COMPARISON:  09/15/2022 FINDINGS: Hyperinflated lungs consistent with COPD. Enlarged cardiac silhouette. Bibasilar minimal consolidation volume loss. Bilateral small pleural effusions. Thoracic degenerative changes. Right-sided Port-A-Cath tip distal SVC. IMPRESSION: Enlarged cardiac silhouette. Hyperinflated lungs consistent with COPD. Small pleural effusions and minimal bibasilar consolidation. Electronically Signed   By: Layla Maw M.D.   On: 02/11/2023 15:22   US Venous Img Lower Bilateral  Result Date: 02/11/2023 CLINICAL DATA:  History of DVT, bilateral lower extremity edema EXAM: BILATERAL LOWER EXTREMITY VENOUS DOPPLER ULTRASOUND TECHNIQUE: Gray-scale sonography with compression, as well as color and duplex ultrasound, were performed to evaluate the deep venous system(s) from the level of the common femoral vein through the popliteal and proximal calf veins. COMPARISON:  None Available. FINDINGS: VENOUS Normal compressibility of the common femoral, and superficial femoral veins. There is nonocclusive thrombus identified within the popliteal veins bilaterally. The posterior tibial veins of the right catheter largely occluded. Left posterior tibial veins are patent. Visualized portions of profunda femoral vein and great saphenous vein unremarkable. Doppler waveforms show normal direction of venous flow, normal respiratory plasticity and response to augmentation. OTHER None. Limitations: none IMPRESSION: 1. Nonocclusive DVT within the popliteal veins bilaterally. 2. Occlusive DVT within the posterior tibial veins of the right calf. 3. These results will be called to the ordering clinician or representative by the Radiologist Assistant, and communication documented in the PACS or [employer]. Electronically Signed   By: Helyn Numbers M.D.   On: 02/11/2023 15:18   CT Angio Chest Pulmonary Embolism (PE) W or WO Contrast  Result Date: 02/01/2023 CLINICAL DATA:  Shortness of  breath EXAM: CT ANGIOGRAPHY CHEST WITH CONTRAST TECHNIQUE: Multidetector CT imaging of the chest was performed using the standard protocol during bolus administration of intravenous contrast. Multiplanar CT image reconstructions and MIPs were obtained to evaluate the vascular anatomy. RADIATION DOSE REDUCTION: This exam was performed according to the departmental dose-optimization program which includes automated exposure control, adjustment of the mA and/or kV according to patient size and/or use of iterative reconstruction technique. CONTRAST:  80mL ISOVUE-370 IOPAMIDOL (ISOVUE-370) INJECTION 76% COMPARISON:  01/22/2023 FINDINGS: Cardiovascular: No filling defects in the pulmonary arteries to suggest pulmonary emboli. Heart is normal size. Aorta is normal caliber. Scattered coronary artery and aortic calcifications. Mediastinum/Nodes: No mediastinal, hilar, or axillary adenopathy. Trachea and esophagus are unremarkable. Large left thyroid goiter again noted, unchanged. Lungs/Pleura: Biapical scarring. Ground-glass airspace disease in the lingula and left lower lobe, progressed since prior study. Similar findings  also now seen in the right lung base. Findings compatible with worsening pneumonia. No visible effusions or pneumothorax. Upper Abdomen: Large right hepatic lobe mass again seen as on prior imaging compatible with patient's known cholangiocarcinoma. Musculoskeletal: No acute findings Review of the MIP images confirms the above findings. IMPRESSION: Airspace disease within the lingula and left lower lobe as well as right lung base compatible with pneumonia, worsening since prior study. No evidence of pulmonary embolus. Right hepatic lobe mass again noted as seen on recent imaging and cancer staging. Coronary artery disease. Aortic Atherosclerosis (ICD10-I70.0). Electronically Signed   By: Charlett Nose M.D.   On: 02/01/2023 15:46   CT CHEST ABDOMEN PELVIS W CONTRAST  Result Date: 01/23/2023 CLINICAL  DATA:  Cholangiocarcinoma * Tracking Code: BO * EXAM: CT CHEST, ABDOMEN, AND PELVIS WITH CONTRAST TECHNIQUE: Multidetector CT imaging of the chest, abdomen and pelvis was performed following the standard protocol during bolus administration of intravenous contrast. RADIATION DOSE REDUCTION: This exam was performed according to the departmental dose-optimization program which includes automated exposure control, adjustment of the mA and/or kV according to patient size and/or use of iterative reconstruction technique. CONTRAST:  OMNIPAQUE IOHEXOL 300 MG/ML  SOLN COMPARISON:  PET-CT, 12/10/2022 FINDINGS: CT CHEST FINDINGS Cardiovascular: Right chest port catheter. Normal heart size. Scattered left and right coronary artery calcifications. No pericardial effusion. Mediastinum/Nodes: Unchanged prominent high right paratracheal lymph nodes (series 2, image 97). Unchanged heterogeneous left lobe thyroid goiter, no further follow-up or characterization is required in the setting of known metastatic primary malignancy (series 2, image 7). In the setting of significant comorbidities or limited life expectancy, no follow-up recommended (ref: J Am Coll Radiol. 2015 Feb;12(2): 143-50). Trachea, and esophagus demonstrate no significant findings. Lungs/Pleura: Slightly improved, although still extensive ground-glass airspace opacity primarily seen throughout the left lower lobe and lingula (series 3, image 115). Minimal paraseptal emphysema. No pleural effusion or pneumothorax. Musculoskeletal: No chest wall abnormality. No acute osseous findings. CT ABDOMEN PELVIS FINDINGS Hepatobiliary: Hypodense lesion of the right lobe of the liver, hepatic segment VII/VIII is slightly diminished in size measuring 6.8 x 6.3 cm, previously 8.3 x 6.1 cm when measured similarly (series 2, image 67). No evident change in additional smaller hypodense lesions scattered throughout the right lobe of the liver, these more difficult to see on  prior noncontrast PET-CT, for example in the superior liver dome, hepatic segment VIII measuring 1.6 x 1.5 cm (series 2, image 62). No gallstones, gallbladder wall thickening, or biliary dilatation. Pancreas: Unremarkable. No pancreatic ductal dilatation or surrounding inflammatory changes. Spleen: Normal in size without significant abnormality. Adrenals/Urinary Tract: Adrenal glands are unremarkable. Unchanged benign, thinly septated and rim calcified cyst of the superior pole of the right kidney, as well as additional simple bilateral renal cortical cysts, for which no further follow-up or characterization is required. Kidneys are otherwise normal, without renal calculi, solid lesion, or hydronephrosis. Bladder is unremarkable. Stomach/Bowel: Stomach is within normal limits. Appendix appears normal. No evidence of bowel wall thickening, distention, or inflammatory changes. Vascular/Lymphatic: Aortic atherosclerosis. Similar enlarged celiac axis, gastrohepatic ligament, and retroperitoneal lymph nodes, largest gastrohepatic ligament node measuring 2.6 x 1.5 cm (series 2, image 67). Reproductive: Prostatomegaly. Other: No abdominal wall hernia or abnormality. No ascites. Musculoskeletal: No acute osseous findings. Status post left hip total arthroplasty. Similar appearance of a lytic metastasis of the right ischial tuberosity (series 2, image 141). Similar sclerotic metastasis of the posterior left ilium (series 2, image 122). Similar mixed lytic and sclerotic metastasis of  the left scapular body (series 2, image 9). Similar lytic metastasis of the right T1 transverse process (series 2, image 4). IMPRESSION: 1. Hypodense lesion of the right lobe of the liver, hepatic segment VII/VIII is slightly diminished in size, consistent with treatment response. 2. No evident change in additional smaller hypodense lesions scattered throughout the right lobe of the liver, these more difficult to see on prior noncontrast PET-CT.  3. Unchanged enlarged celiac axis, gastrohepatic ligament, and retroperitoneal lymph nodes. Unchanged prominent high right paratracheal lymph nodes. 4. Unchanged osseous metastases. 5. Slightly improved, although still extensive ground-glass airspace opacity primarily seen throughout the left lower lobe and lingula, nonspecific and infectious or inflammatory. 6. Coronary artery disease. 7. Emphysema. Aortic Atherosclerosis (ICD10-I70.0) and Emphysema (ICD10-J43.9). Electronically Signed   By: Jearld Lesch M.D.   On: 01/23/2023 08:56   PERIPHERAL VASCULAR CATHETERIZATION  Result Date: 12/24/2022 See surgical note for result.  NM PET Image Restag (PS) Skull Base To Thigh  Result Date: 12/11/2022 CLINICAL DATA:  Subsequent treatment strategy for cholangiocarcinoma. EXAM: NUCLEAR MEDICINE PET SKULL BASE TO THIGH TECHNIQUE: 13.7 mCi F-18 FDG was injected intravenously. Full-ring PET imaging was performed from the skull base to thigh after the radiotracer. CT data was obtained and used for attenuation correction and anatomic localization. Fasting blood glucose: 146 mg/dl COMPARISON:  PET-CT 16/07/9603 FINDINGS: Mediastinal blood pool activity: SUV max 1.67 Liver activity: SUV max NA NECK: No hypermetabolic lymph nodes in the neck. Incidental CT findings: Heterogeneous and enlarged left lobe of thyroid gland is again noted. This has been evaluated on previous imaging. (ref: J Am Coll Radiol. 2015 Feb;12(2): 143-50). CHEST: No hypermetabolic mediastinal or hilar nodes. No suspicious pulmonary nodules on the CT scan. Incidental CT findings: Persistent diffuse ground-glass and airspace disease within the left upper lobe and left lower lobe identified. Interval resolution of previous right lung ground-glass and airspace densities. ABDOMEN/PELVIS: Right lobe of liver mass scratch set tracer avid lesion scratch set tracer avid mass within the right hepatic lobe is again seen. This is somewhat ill-defined measuring  approximately 8.2 x 5.6 cm within SUV max of 5.88, image 149/2. On the prior PET-CT this measured approximately 6.6 x 4.2 cm with SUV max of 7.09. No abnormal tracer uptake identified within the pancreas, spleen or adrenal glands. No tracer avid abdominopelvic lymph nodes. Incidental CT findings: Prominent upper abdominal nodes are again noted as seen previously, including reference gastrohepatic ligament lymph node measuring 1.7 cm with SUV max of 1.87, image, image 155/2. Previously this measured 1.9 cm with SUV max of 1.58. Unchanged right kidney cyst. No follow-up imaging recommended. SKELETON: Multifocal tracer avid bone metastases are again noted. Index lesions include: -expansile lytic lesion involving the right T1 transverse process with SUV max of 6.52, image 59/2. Previously this had an SUV max of 6.82. -Lytic lesion associated with the base of the left coracoid process has an SUV max of 2.67, image 67/2. Previously 4.25. -Expansile lesion involving the right ischial tuberosity measures 2.4 cm within SUV max of 6.41, image 268/2. On the prior exam this measured 1.3 cm within SUV max of 4.08. -Tracer avid lesion involving the left ischial tuberosity has an SUV max of 3.28, image 265/2 Unchanged from previous exam. -New lesion within the posterior left iliac bone has an SUV max of 5.67, image 243/2. Incidental CT findings: None. IMPRESSION: 1. There is been mild increase in size of the tracer avid mass within right lobe of liver compatible with known cholangiocarcinoma. The degree of  tracer uptake however is slightly decreased in the interval. 2. Multifocal tracer avid bone metastases are again noted. When compared with the previous exam there has been an mixed interval response to therapy. Specifically, there has been interval increase in size and FDG uptake associated with the right ischial tuberosity lesion and there is a new lesion within the posterior left iliac bone. Decreased tracer uptake is  associated with the left scapular lesion and there is stable tracer uptake associated with the right T1 transverse process lesion. 3. Persistent ground-glass and airspace densities within the left upper lobe and left lower lobe compatible with inflammatory or infectious process. Interval resolution of previous right lung ground-glass and airspace densities. Electronically Signed   By: Signa Kell M.D.   On: 12/11/2022 06:37     Assessment and plan-   # Atrial flutter, Cardiology recommendation reviewed.  Recommend metoprolol for rate control, therapeutic Lovenox subcutaneous twice daily for months then consideration of cardioversion.  Diuretics for leg swelling/diastolic CHF.  # Recent lower extremity DVT secondary to Lovenox noncompliance, Antiphospholipid syndrome. Continue on Lovenox 1 mg/kg subcutaneous twice daily-adjust dose 240 mg twice daily due to recent weight gain.  Will further titrate dose with his weight changes.  # Anemia, likely secondary to chemotherapy treatments.  Continue close monitor. # Cholangiocarcinoma, recent CT scan showed likely increased liver mass. Further confirmed with radiology. Recommend to hold off palliative chemotherapy cisplatin gemcitabine Durvalumab. Patient has FGFR2 -ADK mutation, will check insurance coverage for Futibatinib, or Pemigatinib or Infigratinib-he has follow-up appointment with me next week  # Bone metastasis, follow-up with radiation oncology for palliative radiation to bone lesions. # Lung infiltrates on CT scan, continue follow-up with pulmonology.  Suspected aspiration.  DDX includes organizing pneumonia, ILD, aspiration, lymphatic spread, etc. He declined repeat bronchoscopy/biopsy.  Recently finished a course of Levaquin.   Thank you for allowing me to participate in the care of this patient.   [name], MD, PhD Hematology Oncology 02/14/2023

## 2023-02-14 NOTE — ED Notes (Signed)
ED TO INPATIENT HANDOFF REPORT  ED Nurse Name and Phone #: Jen Mow [phone]  S Name/Age/Gender Christopher Marsh 62 y.o. male Room/Bed: ED38A/ED38A  Code Status   Code Status: Full Code  Home/SNF/Other Home Patient oriented to: self, place, time, and situation Is this baseline? Yes   Triage Complete: Triage complete  Chief Complaint Atrial flutter with rapid ventricular response [I48.92]  Triage Note Pt via POV from cancer center. Pt was going for routine blood work. States that they took his VS and HR was in the 140s. Cancer center did an EKG and reports a fib RVR, pt denies of irregular heart rhythm. States he does take Eliquis for DVT and started that last week. States that he has been feeling faint.   Cancer center did do blood work this AM. [name] is accessed. Pt asking to wait to talk to the doctor for chest XR.    Allergies No Known Allergies  Level of Care/Admitting Diagnosis ED Disposition     ED Disposition  Admit   Condition  --   Comment  Hospital Area: [facility] [[id number]]  Level of Care: Progressive [102]  Admit to Progressive based on following criteria: CARDIOVASCULAR & THORACIC of moderate stability with acute coronary syndrome symptoms/low risk myocardial infarction/hypertensive urgency/arrhythmias/heart failure potentially compromising stability and stable post cardiovascular intervention patients.  Covid Evaluation: Asymptomatic - no recent exposure (last 10 days) testing not required  Diagnosis: Atrial flutter with rapid ventricular response [454098]  Admitting Physician: Lorretta Harp [[id number]]  Attending Physician: Lorretta Harp [phone]  Certification:: I certify this patient will need inpatient services for at least 2 midnights          B Medical/Surgery History Past Medical History:  Diagnosis Date   Antiphospholipid antibody syndrome    Arthritis    Cancer    Diabetes mellitus without complication    DVT (deep venous  thrombosis)    Lt leg   Dyspnea    Elevated lipids    Erythrocytosis    Hyperlipidemia    Hypertension    Lower extremity edema    Multinodular thyroid    Post-thrombotic syndrome    Past Surgical History:  Procedure Laterality Date   COLONOSCOPY WITH PROPOFOL N/A 09/23/2018   Per Dr. Norma Fredrickson, polyp, repeat in 3 yrs    EYE MUSCLE SURGERY     JOINT REPLACEMENT Left 07/22/2017   PORTA CATH INSERTION N/A 12/24/2022   Procedure: PORTA CATH INSERTION;  Surgeon: Annice Needy, MD;  Location: [facility] INVASIVE CV LAB;  Service: Cardiovascular;  Laterality: N/A;   TOTAL HIP ARTHROPLASTY Left 07/22/2017   Procedure: TOTAL HIP ARTHROPLASTY;  Surgeon: Donato Heinz, MD;  Location: [facility] ORS;  Service: Orthopedics;  Laterality: Left;     A IV Location/Drains/Wounds Patient Lines/Drains/Airways Status     Active Line/Drains/Airways     Name Placement date Placement time Site Days   Implanted Port 12/24/22 Right Chest 12/24/22  4782  Chest  52            Intake/Output Last 24 hours  Intake/Output Summary (Last 24 hours) at 02/14/2023 1536 Last data filed at 02/14/2023 0247 Gross per 24 hour  Intake 93.81 ml  Output --  Net 93.81 ml    Labs/Imaging Results for orders placed or performed during the hospital encounter of 02/13/23 (from the past 48 hour(s))  Troponin I (High Sensitivity)     Status: None   Collection Time: 02/13/23 10:10 AM  Result Value Ref Range  Troponin I (High Sensitivity) 11 <18 ng/L    Comment: (NOTE) Elevated high sensitivity troponin I (hsTnI) values and significant  changes across serial measurements may suggest ACS but many other  chronic and acute conditions are known to elevate hsTnI results.  Refer to the "Links" section for chest pain algorithms and additional  guidance. Performed at [facility], [address] [place], [place] [address]   Phosphorus     Status: None   Collection Time: 02/13/23 10:10 AM  Result Value Ref Range    Phosphorus 3.3 2.5 - 4.6 mg/dL    Comment: Performed at [facility], [address] [place], [place] [address]  TSH     Status: Abnormal   Collection Time: 02/13/23 10:10 AM  Result Value Ref Range   TSH 0.269 (L) 0.350 - 4.500 uIU/mL    Comment: Performed by a 3rd Generation assay with a functional sensitivity of <=0.01 uIU/mL. Performed at [facility], [address] [place], [place] [address]   Brain natriuretic peptide     Status: Abnormal   Collection Time: 02/13/23 11:01 AM  Result Value Ref Range   B Natriuretic Peptide 119.8 (H) 0.0 - 100.0 pg/mL    Comment: Performed at [facility], [address] [place], [place] [address]  CBG monitoring, ED     Status: Abnormal   Collection Time: 02/13/23  4:37 PM  Result Value Ref Range   Glucose-Capillary 153 (H) 70 - 99 mg/dL    Comment: Glucose reference range applies only to samples taken after fasting for at least 8 hours.  Basic metabolic panel     Status: Abnormal   Collection Time: 02/14/23  5:40 AM  Result Value Ref Range   Sodium 138 135 - 145 mmol/L   Potassium 3.6 3.5 - 5.1 mmol/L   Chloride 104 98 - 111 mmol/L   CO2 25 22 - 32 mmol/L   Glucose, Bld 118 (H) 70 - 99 mg/dL    Comment: Glucose reference range applies only to samples taken after fasting for at least 8 hours.   BUN 6 (L) 8 - 23 mg/dL   Creatinine, Ser 2.95 0.61 - 1.24 mg/dL   Calcium 8.0 (L) 8.9 - 10.3 mg/dL   GFR, Estimated >62 >13 mL/min    Comment: (NOTE) Calculated using the CKD-EPI Creatinine Equation (2021)    Anion gap 9 5 - 15    Comment: Performed at [facility], [address] [place], [place] [address]  CBC     Status: Abnormal   Collection Time: 02/14/23  5:40 AM  Result Value Ref Range   WBC 2.6 (L) 4.0 - 10.5 K/uL   RBC 2.58 (L) 4.22 - 5.81 MIL/uL   Hemoglobin 8.3 (L) 13.0 - 17.0 g/dL   HCT 84.6 (L) 96.2 - 95.2 %   MCV 103.9 (H) 80.0 - 100.0 fL   MCH 32.2 26.0 - 34.0 pg   MCHC 31.0 30.0 -  36.0 g/dL   RDW 84.1 (H) 32.4 - 40.1 %   Platelets 212 150 - 400 K/uL   nRBC 0.0 0.0 - 0.2 %    Comment: Performed at [facility], [address] [place], [place] [address]  T4, free     Status: None   Collection Time: 02/14/23  5:40 AM  Result Value Ref Range   Free T4 1.01 0.61 - 1.12 ng/dL    Comment: (NOTE) Biotin ingestion may interfere with free T4 tests. If the results  are inconsistent with the TSH level, previous test results, or the clinical presentation, then consider biotin interference. If needed, order repeat testing after stopping biotin. Performed at [facility], [address] [place], [place] [address]   CBG monitoring, ED     Status: Abnormal   Collection Time: 02/14/23  7:45 AM  Result Value Ref Range   Glucose-Capillary 128 (H) 70 - 99 mg/dL    Comment: Glucose reference range applies only to samples taken after fasting for at least 8 hours.  CBG monitoring, ED     Status: Abnormal   Collection Time: 02/14/23 12:17 PM  Result Value Ref Range   Glucose-Capillary 191 (H) 70 - 99 mg/dL    Comment: Glucose reference range applies only to samples taken after fasting for at least 8 hours.   ECHOCARDIOGRAM COMPLETE  Result Date: 02/14/2023    ECHOCARDIOGRAM REPORT   Patient Name:   Christopher Marsh Date of Exam: 02/13/2023 Medical Rec #:  [id number]        Height:       75.0 in Accession #:    [id number]       Weight:       309.1 lb Date of Birth:  08-24-1961         BSA:          2.640 m Patient Age:    61 years         BP:           151/77 mmHg Patient Gender: M                HR:           95 bpm. Exam Location:  [facility] Procedure: 2D Echo, Cardiac Doppler and Color Doppler Indications:     R06.00 Dyspnea  History:         Patient has prior history of Echocardiogram examinations, most                  recent 08/19/2022. Signs/Symptoms:Dyspnea; Risk                  Factors:Hypertension, Dyslipidemia and Diabetes. Lower                  extremity edema.   Sonographer:     Daphine Deutscher RDCS Referring Phys:  [id number] SHERI HAMMOCK Diagnosing Phys: Julien Nordmann MD IMPRESSIONS  1. Left ventricular ejection fraction, by estimation, is 50 to 55%. The left ventricle has low normal function. The left ventricle has no regional wall motion abnormalities. Left ventricular diastolic parameters are indeterminate.  2. Right ventricular systolic function is normal. The right ventricular size is normal. Tricuspid regurgitation signal is inadequate for assessing PA pressure.  3. The mitral valve is normal in structure. Mild mitral valve regurgitation. No evidence of mitral stenosis.  4. The aortic valve has an indeterminant number of cusps. Aortic valve regurgitation is not visualized. Aortic valve sclerosis is present, with no evidence of aortic valve stenosis.  5. The inferior vena cava is dilated in size with <50% respiratory variability, suggesting right atrial pressure of 15 mmHg. FINDINGS  Left Ventricle: Left ventricular ejection fraction, by estimation, is 50 to 55%. The left ventricle has low normal function. The left ventricle has no regional wall motion abnormalities. The left ventricular internal cavity size was normal in size. There is no left ventricular hypertrophy. Left ventricular diastolic parameters are indeterminate. Right Ventricle: The right ventricular size is normal. No increase in  right ventricular wall thickness. Right ventricular systolic function is normal. Tricuspid regurgitation signal is inadequate for assessing PA pressure. Left Atrium: Left atrial size was normal in size. Right Atrium: Right atrial size was normal in size. Pericardium: There is no evidence of pericardial effusion. Mitral Valve: The mitral valve is normal in structure. Mild mitral valve regurgitation. No evidence of mitral valve stenosis. Tricuspid Valve: The tricuspid valve is normal in structure. Tricuspid valve regurgitation is not demonstrated. No evidence of tricuspid  stenosis. Aortic Valve: The aortic valve has an indeterminant number of cusps. Aortic valve regurgitation is not visualized. Aortic valve sclerosis is present, with no evidence of aortic valve stenosis. Pulmonic Valve: The pulmonic valve was normal in structure. Pulmonic valve regurgitation is not visualized. No evidence of pulmonic stenosis. Aorta: The aortic root is normal in size and structure. Venous: The inferior vena cava is dilated in size with less than 50% respiratory variability, suggesting right atrial pressure of 15 mmHg. IAS/Shunts: No atrial level shunt detected by color flow Doppler.  LEFT VENTRICLE PLAX 2D LVIDd:         5.10 cm LVIDs:         3.60 cm LV PW:         0.80 cm LV IVS:        0.80 cm LVOT diam:     2.30 cm LV SV:         98 LV SV Index:   37 LVOT Area:     4.15 cm  RIGHT VENTRICLE             IVC RV Basal diam:  3.90 cm     IVC diam: 2.90 cm RV S prime:     16.80 cm/s TAPSE (M-mode): 2.2 cm LEFT ATRIUM             Index        RIGHT ATRIUM           Index LA diam:        4.80 cm 1.82 cm/m   RA Area:     19.10 cm LA Vol (A2C):   59.9 ml 22.69 ml/m  RA Volume:   52.80 ml  20.00 ml/m LA Vol (A4C):   75.2 ml 28.48 ml/m LA Biplane Vol: 70.3 ml 26.62 ml/m  AORTIC VALVE LVOT Vmax:   133.00 cm/s LVOT Vmean:  96.950 cm/s LVOT VTI:    0.236 m  AORTA Ao Root diam: 3.90 cm Ao Asc diam:  3.30 cm MV E velocity: 126.25 cm/s                             SHUNTS                             Systemic VTI:  0.24 m                             Systemic Diam: 2.30 cm Julien Nordmann MD Electronically signed by Julien Nordmann MD Signature Date/Time: 02/14/2023/11:51:33 AM    Final    CT Angio Chest PE W and/or Wo Contrast  Result Date: 02/13/2023 CLINICAL DATA:  Pulmonary embolism suspected. High probability DVT. Dyspnea. EXAM: CT ANGIOGRAPHY CHEST WITH CONTRAST TECHNIQUE: Multidetector CT imaging of the chest was performed using the standard protocol during bolus administration of intravenous contrast.  Multiplanar CT image reconstructions  and MIPs were obtained to evaluate the vascular anatomy. RADIATION DOSE REDUCTION: This exam was performed according to the departmental dose-optimization program which includes automated exposure control, adjustment of the mA and/or kV according to patient size and/or use of iterative reconstruction technique. CONTRAST:  50mL OMNIPAQUE IOHEXOL 350 MG/ML SOLN COMPARISON:  02/01/2023 FINDINGS: Cardiovascular: Heart is mildly enlarged. No pericardial effusion. There are coronary artery calcifications. The pulmonary arteries are moderately well opacified and there is no acute pulmonary embolus. RIGHT-sided port tip extends to the level of the LOWER superior vena cava/UPPER RIGHT atrium. Mediastinum/Nodes: The LEFT lobe of the thyroid gland is enlarged and nodular, consistent with goiter. The appearance is unchanged. Esophagus is unremarkable. There are scattered sub centimeter mediastinal and axillary lymph nodes. Lungs/Pleura: Airways are patent. There are bilateral pleural effusions. LEFT LOWER lobe airspace filling and atelectasis are noted, slightly improved compared to prior study. There has been improvement in RIGHT UPPER and LOWER lobe aeration compared to prior study. Upper Abdomen: There is marked heterogeneous appearance of the liver. The mass previously noted in the RIGHT hepatic lobe is estimated to measure 7.7 centimeters. Cirrhotic morphology of the liver. There is adenopathy in the gastrohepatic ligament, largest node measuring 1 6 centimeters in short axis. Musculoskeletal: There are degenerative changes in the spine. A sclerotic lesion in the UPPER sternum is 7 millimeters, suspicious for metastatic disease. Review of the MIP images confirms the above findings. IMPRESSION: 1. Technically adequate exam showing no acute pulmonary embolus. 2. Cardiomegaly. Coronary artery calcifications. 3. Bilateral pleural effusions. 4. Improved aeration of the RIGHT UPPER and LOWER  lobes compared to prior study. 5. LEFT LOWER lobe and lingula airspace filling and atelectasis, slightly improved compared to prior study. 6. Cirrhotic morphology of the liver. 7.7 centimeters mass in the RIGHT hepatic lobe. 7. Gastrohepatic ligament adenopathy. 8. A 7 millimeters sclerotic lesion in the UPPER sternum is suspicious for metastatic disease. 9. LEFT thyroid goiter. Electronically Signed   By: Norva Pavlov M.D.   On: 02/13/2023 12:09    Pending Labs Unresulted Labs (From admission, onward)     Start     Ordered   02/15/23 0500  Basic metabolic panel  Tomorrow morning,   R        02/14/23 1157   02/15/23 0500  Magnesium  Tomorrow morning,   R        02/14/23 1157            Vitals/Pain Today's Vitals   02/14/23 0900 02/14/23 1112 02/14/23 1239 02/14/23 1504  BP: (!) 145/75 127/63  (!) 144/75  Pulse: 94 95  97  Resp: 18 19  18   Temp:   98.1 F (36.7 C) 98 F (36.7 C)  TempSrc:   Oral Oral  SpO2: 96% 96%  96%    Isolation Precautions No active isolations  Medications Medications  ondansetron (ZOFRAN) injection 4 mg (has no administration in time range)  hydrALAZINE (APRESOLINE) injection 5 mg (has no administration in time range)  acetaminophen (TYLENOL) tablet 650 mg (has no administration in time range)  albuterol (PROVENTIL) (2.5 MG/3ML) 0.083% nebulizer solution 2.5 mg (has no administration in time range)  dextromethorphan-guaiFENesin (MUCINEX DM) 30-600 MG per 12 hr tablet 1 tablet (has no administration in time range)  insulin aspart (novoLOG) injection 0-5 Units (0 Units Subcutaneous Given 02/14/23 0012)  insulin aspart (novoLOG) injection 0-9 Units (2 Units Subcutaneous Given 02/14/23 1237)  metoprolol tartrate (LOPRESSOR) injection 5 mg (has no administration in time range)  metoprolol  tartrate (LOPRESSOR) tablet 12.5 mg (12.5 mg Oral Given 02/14/23 1505)  furosemide (LASIX) injection 40 mg (40 mg Intravenous Given 02/14/23 0853)  pravastatin  (PRAVACHOL) tablet 20 mg (20 mg Oral Given 02/14/23 1112)  LORazepam (ATIVAN) tablet 0.5 mg (has no administration in time range)  calcium-vitamin D (OSCAL WITH D) 500-5 MG-MCG per tablet 1 tablet (1 tablet Oral Given 02/14/23 1112)  magnesium chloride (SLOW-MAG) 64 MG SR tablet 64 mg (64 mg Oral Given 02/14/23 1111)  triamcinolone cream (KENALOG) 0.1 % cream 1 Application (has no administration in time range)  enoxaparin (LOVENOX) injection 140 mg (has no administration in time range)  magnesium sulfate IVPB 1 g 100 mL (0 g Intravenous Stopped 02/13/23 1532)  potassium chloride (KLOR-CON) packet 40 mEq (40 mEq Oral Given 02/13/23 1100)  iohexol (OMNIPAQUE) 350 MG/ML injection 75 mL (50 mLs Intravenous Contrast Given 02/13/23 1130)  magnesium sulfate IVPB 1 g 100 mL (0 g Intravenous Stopped 02/14/23 0247)    Mobility walks     Focused Assessments     R Recommendations: See Admitting Provider Note  Report given to:   Additional Notes:

## 2023-02-14 NOTE — ED Notes (Signed)
Pt cbg 128. Will notify RN, Genevie Cheshire.

## 2023-02-14 NOTE — Progress Notes (Addendum)
Progress Note  Patient Name: Christopher Marsh Date of Encounter: 02/14/2023  Primary Cardiologist: Carolan Clines  Subjective   No chest pain or dyspnea. Remains in atrial flutter with improved ventricular rates in the 90s bpm. No documented UOP for review.   Inpatient Medications    Scheduled Meds:  amLODipine  10 mg Oral Daily   calcium-vitamin D  1 tablet Oral Daily   enoxaparin  120 mg Subcutaneous Q12H   furosemide  40 mg Intravenous BID   insulin aspart  0-5 Units Subcutaneous QHS   insulin aspart  0-9 Units Subcutaneous TID WC   magnesium chloride  1 tablet Oral BID   metoprolol tartrate  12.5 mg Oral Q6H   pravastatin  20 mg Oral Daily   Continuous Infusions:  PRN Meds: acetaminophen, albuterol, dextromethorphan-guaiFENesin, hydrALAZINE, LORazepam, metoprolol tartrate, ondansetron (ZOFRAN) IV, triamcinolone cream   Vital Signs    Vitals:   02/14/23 0325 02/14/23 0500 02/14/23 0800 02/14/23 0853  BP:  136/68 136/65   Pulse:  95 94   Resp:  16 13   Temp: 98.1 F (36.7 C)   98.2 F (36.8 C)  TempSrc: Oral   Oral  SpO2:  94% 96%     Intake/Output Summary (Last 24 hours) at 02/14/2023 1048 Last data filed at 02/14/2023 0247 Gross per 24 hour  Intake 93.81 ml  Output --  Net 93.81 ml   There were no vitals filed for this visit.  Telemetry    Atrial flutter with rates in the mid 90s bpm - Personally Reviewed  ECG    No new tracings - Personally Reviewed  Physical Exam   GEN: No acute distress.   Neck: No JVD. Cardiac: RRR, no murmurs, rubs, or gallops.  Respiratory: Clear to auscultation bilaterally.  GI: Soft, nontender, non-distended.   MS: Bilateral lower extremity edema; No deformity. Neuro:  Alert and oriented x 3; Nonfocal.  Psych: Normal affect.  Labs    Chemistry Recent Labs  Lab 02/11/23 1129 02/13/23 0808 02/14/23 0540  NA 136 137 138  K 3.6 3.4* 3.6  CL 103 106 104  CO2 GLUCOSE 107* 113* 118*  BUN 7* 7* 6*   CREATININE 0.84 0.89 0.76  CALCIUM 8.7* 8.3* 8.0*  PROT 6.2* 6.0*  --   ALBUMIN 3.4* 3.5  --   AST 26 24  --   ALT 17 16  --   ALKPHOS 50 52  --   BILITOT 0.6 0.4  --   GFRNONAA >60 >60 >60  ANIONGAP Hematology Recent Labs  Lab 02/11/23 1130 02/13/23 0808 02/14/23 0540  WBC 2.4* 3.7* 2.6*  RBC 2.63* 2.60* 2.58*  HGB 8.6* 8.5* 8.3*  HCT 27.0* 27.0* 26.8*  MCV 102.7* 103.8* 103.9*  MCH 32.7 32.7 32.2  MCHC 31.9 31.5 31.0  RDW 16.0* 15.9* 15.9*  PLT 88* 186 212    Cardiac EnzymesNo results for input(s): "TROPONINI" in the last 168 hours. No results for input(s): "TROPIPOC" in the last 168 hours.   BNP Recent Labs  Lab 02/11/23 1129 02/13/23 1101  BNP 180.2* 119.8*     DDimer No results for input(s): "DDIMER" in the last 168 hours.   Radiology    CT Angio Chest PE W and/or Wo Contrast  Result Date: 02/13/2023 IMPRESSION: 1. Technically adequate exam showing no acute pulmonary embolus. 2. Cardiomegaly. Coronary artery calcifications. 3. Bilateral pleural effusions. 4. Improved aeration of the RIGHT UPPER and  LOWER lobes compared to prior study. 5. LEFT LOWER lobe and lingula airspace filling and atelectasis, slightly improved compared to prior study. 6. Cirrhotic morphology of the liver. 7.7 centimeters mass in the RIGHT hepatic lobe. 7. Gastrohepatic ligament adenopathy. 8. A 7 millimeters sclerotic lesion in the UPPER sternum is suspicious for metastatic disease. 9. LEFT thyroid goiter. Electronically Signed   By: Norva Pavlov M.D.   On: 02/13/2023 12:09    Cardiac Studies   2D echo pending __________  2D echo 08/19/2022: 1. Left ventricular ejection fraction, by estimation, is 60 to 65%. The  left ventricle has normal function. The left ventricle has no regional  wall motion abnormalities. Left ventricular diastolic parameters were  normal.   2. Right ventricular systolic function is normal. The right ventricular  size is mildly enlarged.  Tricuspid regurgitation signal is inadequate for  assessing PA pressure.   3. No valvular stenoses or regurgitation noted.   Patient Profile     62 y.o. male with history of metastatic cholangiocarcinoma complicated by recurrent DVT on Lovenox as outpatient (failed Xarelto, not on Coumadin due to chemotherapy) and was taking Lovenox once daily rather than twice daily secondary to epistaxis, antiphospholipid syndrome, multinodular goiter, HTN, and DM2 who we are seeing for new onset atrial flutter.   Assessment & Plan    1. Atrial flutter: -Remains in atrial flutter with rates in the 90s bpm -Consolidate Lopressor to 50 mg bid, titrate as indicated  -Due to chemotherapy, hematology has recommended therapeutic Lovenox historically  -Remains on therapeutic Lovenox, IM to discuss anticoagulation options with hematology/oncology  -Failed Xarelto previously due to recurrent DVT -Will need a minimum of 4 weeks of uninterrupted therapeutic anticoagulation prior to pursuing DCCV -If ventricular rates are difficult to control, may need to pursue TEE-guided DCCV prior -Echo pending -TSH suppressed, add on free T4, possibly contributing to atrial arrhythmia -CHADS2VASc 3 (HTN, DM, vascular disease)  2. Recurrent DVT with antiphospholipid syndrome: -Had only been using Lovenox once daily rather than twice daily (self reduced secondary to epistaxis) -Therapeutic Lovenox  3. Lower extremity swelling with history of lymphedema: -Possibly exacerbated by atrial flutter with RVR -Amlodipine may be contributing as well, would stop and titrate metoprolol for added rate control -Preliminary echo with dilated IVC -IV Lasix 40 mg bid, MD recommends torsemide 40 mg daily for now at home with KCl repletion  -Hold lymphedema pumps for now with DVT  4. Abnormal TSH with multinodular thyroid: -Add on free T4  5. Macrocytic anemia: -Hgb has trended from the mid 10 range to 8.3 over the past 6 weeks (on  chemotherapy) -Per hematology/oncology  6. Epistaxis: -May need ENT referral as outpatient       For questions or updates, please contact [facility] Please consult [email or web address] for contact info under Cardiology/STEMI.    Signed, Eula Listen, PA-C [facility] Pager: [phone] 02/14/2023, 10:48 AM

## 2023-02-15 ENCOUNTER — Other Ambulatory Visit: Payer: Self-pay

## 2023-02-15 ENCOUNTER — Other Ambulatory Visit: Payer: Self-pay | Admitting: Oncology

## 2023-02-15 DIAGNOSIS — I5031 Acute diastolic (congestive) heart failure: Secondary | ICD-10-CM | POA: Diagnosis not present

## 2023-02-15 DIAGNOSIS — I4892 Unspecified atrial flutter: Secondary | ICD-10-CM | POA: Diagnosis not present

## 2023-02-15 DIAGNOSIS — J90 Pleural effusion, not elsewhere classified: Secondary | ICD-10-CM | POA: Diagnosis not present

## 2023-02-15 DIAGNOSIS — C221 Intrahepatic bile duct carcinoma: Secondary | ICD-10-CM

## 2023-02-15 DIAGNOSIS — I824Y3 Acute embolism and thrombosis of unspecified deep veins of proximal lower extremity, bilateral: Secondary | ICD-10-CM | POA: Diagnosis not present

## 2023-02-15 DIAGNOSIS — I82493 Acute embolism and thrombosis of other specified deep vein of lower extremity, bilateral: Secondary | ICD-10-CM

## 2023-02-15 LAB — GLUCOSE, CAPILLARY
Glucose-Capillary: 144 mg/dL — ABNORMAL HIGH (ref 70–99)
Glucose-Capillary: 178 mg/dL — ABNORMAL HIGH (ref 70–99)

## 2023-02-15 LAB — BASIC METABOLIC PANEL
Anion gap: 9 (ref 5–15)
BUN: 7 mg/dL — ABNORMAL LOW (ref 8–23)
CO2: 26 mmol/L (ref 22–32)
Calcium: 8.1 mg/dL — ABNORMAL LOW (ref 8.9–10.3)
Chloride: 102 mmol/L (ref 98–111)
Creatinine, Ser: 0.69 mg/dL (ref 0.61–1.24)
GFR, Estimated: >60 mL/min (ref >60)
Glucose, Bld: 130 mg/dL — ABNORMAL HIGH (ref 70–99)
Potassium: 3.4 mmol/L — ABNORMAL LOW (ref 3.5–5.1)
Sodium: 137 mmol/L (ref 135–145)

## 2023-02-15 LAB — CANCER ANTIGEN 19-9: CA 19-9: 119 U/mL — ABNORMAL HIGH (ref 0–35)

## 2023-02-15 LAB — CEA: CEA: 11.9 ng/mL — ABNORMAL HIGH (ref 0.0–4.7)

## 2023-02-15 LAB — MAGNESIUM: Magnesium: 1.7 mg/dL (ref 1.7–2.4)

## 2023-02-15 MED ORDER — HEPARIN SOD (PORK) LOCK FLUSH 100 UNIT/ML IV SOLN
500.0000 [IU] | INTRAVENOUS | Status: AC | PRN
  Administered 2023-02-15: 500 [IU]
  Filled 2023-02-15: qty 5

## 2023-02-15 MED ORDER — METOPROLOL TARTRATE 50 MG PO TABS: 50.0000 mg | ORAL_TABLET | Freq: Two times a day (BID) | ORAL | Status: DC

## 2023-02-15 MED ORDER — TORSEMIDE 40 MG PO TABS: 40.0000 mg | ORAL_TABLET | Freq: Every day | ORAL | 0 refills | Status: DC

## 2023-02-15 MED ORDER — POTASSIUM CHLORIDE CRYS ER 20 MEQ PO TBCR: 40.0000 meq | EXTENDED_RELEASE_TABLET | Freq: Every day | ORAL | 0 refills | Status: DC

## 2023-02-15 MED ORDER — CHLORHEXIDINE GLUCONATE CLOTH 2 % EX PADS
6.0000 | MEDICATED_PAD | Freq: Every day | CUTANEOUS | Status: DC
  Administered 2023-02-15: 6 via TOPICAL

## 2023-02-15 MED ORDER — METOPROLOL TARTRATE 50 MG PO TABS
75.0000 mg | ORAL_TABLET | Freq: Two times a day (BID) | ORAL | Status: DC
  Administered 2023-02-15: 75 mg via ORAL
  Filled 2023-02-15: qty 1

## 2023-02-15 MED ORDER — POTASSIUM CHLORIDE CRYS ER 20 MEQ PO TBCR
40.0000 meq | EXTENDED_RELEASE_TABLET | Freq: Three times a day (TID) | ORAL | Status: DC
  Administered 2023-02-15: 40 meq via ORAL
  Filled 2023-02-15: qty 2

## 2023-02-15 MED ORDER — TORSEMIDE 20 MG PO TABS: 20.0000 mg | ORAL_TABLET | Freq: Every day | ORAL | 0 refills | Status: DC

## 2023-02-15 MED ORDER — METOPROLOL TARTRATE 75 MG PO TABS: 75.0000 mg | ORAL_TABLET | Freq: Two times a day (BID) | ORAL | 0 refills | Status: DC

## 2023-02-15 MED ORDER — ENOXAPARIN SODIUM 150 MG/ML IJ SOSY: 140.0000 mg | PREFILLED_SYRINGE | Freq: Two times a day (BID) | INTRAMUSCULAR | 0 refills | Status: DC

## 2023-02-15 NOTE — Progress Notes (Signed)
Progress Note  Patient Name: Christopher Marsh Date of Encounter: 02/15/2023  Primary Cardiologist: Carolan Clines  Subjective   No chest pain or dyspnea. Remains in atrial flutter with ventricular rates in the 90s bpm with brief episodes into the 140s bpm. No documented UOP for review. Hopeful for discharge today.   Inpatient Medications    Scheduled Meds:  calcium-vitamin D  1 tablet Oral Daily   Chlorhexidine Gluconate Cloth  6 each Topical Daily   enoxaparin  140 mg Subcutaneous Q12H   furosemide  40 mg Intravenous BID   insulin aspart  0-5 Units Subcutaneous QHS   insulin aspart  0-9 Units Subcutaneous TID WC   magnesium chloride  1 tablet Oral BID   metoprolol tartrate  50 mg Oral BID   pravastatin  20 mg Oral Daily   Continuous Infusions:  PRN Meds: acetaminophen, albuterol, dextromethorphan-guaiFENesin, hydrALAZINE, LORazepam, metoprolol tartrate, ondansetron (ZOFRAN) IV, triamcinolone cream   Vital Signs    Vitals:   02/14/23 2111 02/15/23 0028 02/15/23 0457 02/15/23 0458  BP: (!) 164/64 (!) 145/75  (!) 134/91  Pulse: 96 96  66  Resp: Temp: 98.5 F (36.9 C) 98 F (36.7 C)  98.4 F (36.9 C)  TempSrc:      SpO2: 97% 93%  95%  Weight:   135.2 kg   Height:       No intake or output data in the 24 hours ending 02/15/23 0948  Filed Weights   02/14/23 1721 02/15/23 0457  Weight: (!) 136.6 kg 135.2 kg    Telemetry    Atrial flutter with rates in the mid 90s bpm with brief episodes into the 140s bpm - Personally Reviewed  ECG    No new tracings - Personally Reviewed  Physical Exam   GEN: No acute distress.   Neck: No JVD. Cardiac: RR, no murmurs, rubs, or gallops.  Respiratory: Clear to auscultation bilaterally.  GI: Soft, nontender, non-distended.   MS: Bilateral lower extremity edema; No deformity. Neuro:  Alert and oriented x 3; Nonfocal.  Psych: Normal affect.  Labs    Chemistry Recent Labs  Lab 02/11/23 1129 02/13/23 0808  02/14/23 0540 02/15/23 0447  NA 136 137 138 137  K 3.6 3.4* 3.6 3.4*  CL 103 106 104 102  CO2 GLUCOSE 107* 113* 118* 130*  BUN 7* 7* 6* 7*  CREATININE 0.84 0.89 0.76 0.69  CALCIUM 8.7* 8.3* 8.0* 8.1*  PROT 6.2* 6.0*  --   --   ALBUMIN 3.4* 3.5  --   --   AST 26 24  --   --   ALT 17 16  --   --   ALKPHOS 50 52  --   --   BILITOT 0.6 0.4  --   --   GFRNONAA >60 >60 >60 >60  ANIONGAP Hematology Recent Labs  Lab 02/11/23 1130 02/13/23 0808 02/14/23 0540  WBC 2.4* 3.7* 2.6*  RBC 2.63* 2.60* 2.58*  HGB 8.6* 8.5* 8.3*  HCT 27.0* 27.0* 26.8*  MCV 102.7* 103.8* 103.9*  MCH 32.7 32.7 32.2  MCHC 31.9 31.5 31.0  RDW 16.0* 15.9* 15.9*  PLT 88* 186 212     Cardiac EnzymesNo results for input(s): "TROPONINI" in the last 168 hours. No results for input(s): "TROPIPOC" in the last 168 hours.   BNP Recent Labs  Lab 02/11/23 1129 02/13/23 1101  BNP 180.2* 119.8*  DDimer No results for input(s): "DDIMER" in the last 168 hours.   Radiology    CT Angio Chest PE W and/or Wo Contrast  Result Date: 02/13/2023 IMPRESSION: 1. Technically adequate exam showing no acute pulmonary embolus. 2. Cardiomegaly. Coronary artery calcifications. 3. Bilateral pleural effusions. 4. Improved aeration of the RIGHT UPPER and LOWER lobes compared to prior study. 5. LEFT LOWER lobe and lingula airspace filling and atelectasis, slightly improved compared to prior study. 6. Cirrhotic morphology of the liver. 7.7 centimeters mass in the RIGHT hepatic lobe. 7. Gastrohepatic ligament adenopathy. 8. A 7 millimeters sclerotic lesion in the UPPER sternum is suspicious for metastatic disease. 9. LEFT thyroid goiter. Electronically Signed   By: Norva Pavlov M.D.   On: 02/13/2023 12:09    Cardiac Studies   2D echo 02/13/2023: 1. Left ventricular ejection fraction, by estimation, is 50 to 55%. The  left ventricle has low normal function. The left ventricle has no regional   wall motion abnormalities. Left ventricular diastolic parameters are  indeterminate.   2. Right ventricular systolic function is normal. The right ventricular  size is normal. Tricuspid regurgitation signal is inadequate for assessing  PA pressure.   3. The mitral valve is normal in structure. Mild mitral valve  regurgitation. No evidence of mitral stenosis.   4. The aortic valve has an indeterminant number of cusps. Aortic valve  regurgitation is not visualized. Aortic valve sclerosis is present, with  no evidence of aortic valve stenosis.   5. The inferior vena cava is dilated in size with <50% respiratory  variability, suggesting right atrial pressure of 15 mmHg.  __________  2D echo 08/19/2022: 1. Left ventricular ejection fraction, by estimation, is 60 to 65%. The  left ventricle has normal function. The left ventricle has no regional  wall motion abnormalities. Left ventricular diastolic parameters were  normal.   2. Right ventricular systolic function is normal. The right ventricular  size is mildly enlarged. Tricuspid regurgitation signal is inadequate for  assessing PA pressure.   3. No valvular stenoses or regurgitation noted.   Patient Profile     62 y.o. male with history of metastatic cholangiocarcinoma complicated by recurrent DVT on Lovenox as outpatient (failed Xarelto, not on Coumadin due to chemotherapy) and was taking Lovenox once daily rather than twice daily secondary to epistaxis, antiphospholipid syndrome, multinodular goiter, HTN, and DM2 who we are seeing for new onset atrial flutter.   Assessment & Plan    1. Atrial flutter: -Remains in atrial flutter with rates in the 90s bpm -Consolidate Lopressor to titrated dose of 75 mg bid, titrate as indicated  -Hematology/oncology has recommended therapeutic Lovenox historically  -Remains on therapeutic Lovenox, twice daily dosing -Failed Xarelto previously due to recurrent DVT -Will need a minimum of 4 weeks of  uninterrupted therapeutic anticoagulation prior to pursuing DCCV -If ventricular rates are difficult to control, may need to pursue TEE-guided DCCV prior -Echo with low normal LV systolic function  -TSH suppressed, free T4 normal -CHADS2VASc 3 (HTN, DM, vascular disease)  2. Recurrent DVT with antiphospholipid syndrome: -Had only been using Lovenox once daily rather than twice daily (self reduced secondary to epistaxis) -Therapeutic Lovenox, twice daily dosing  3. Lower extremity swelling with history of lymphedema and DVT: -Possibly exacerbated by atrial flutter with RVR -Amlodipine may be contributing as well, has been stopped -Preliminary echo with dilated IVC -IV Lasix 40 mg bid, MD recommends torsemide 40 mg daily at home with KCl repletion  -Hold lymphedema pumps for  now with DVT  4. Abnormal TSH with multinodular thyroid: -Normal free T4  5. Macrocytic anemia: -Hgb has trended from the mid 10 range to 8.3 over the past 6 weeks (on chemotherapy) -Per hematology/oncology  6. Epistaxis: -May need ENT referral as outpatient       For questions or updates, please contact [facility] Please consult [email or web address] for contact info under Cardiology/STEMI.    Signed, Eula Listen, PA-C [facility] Pager: [phone] 02/15/2023, 9:48 AM

## 2023-02-15 NOTE — Discharge Summary (Signed)
Physician Discharge Summary   Patient: Christopher Marsh MRN: [id number]  DOB: 09/12/1961   Admit:     Date of Admission: 02/13/2023 Admitted from: home   Discharge: Date of discharge: 02/15/23 Disposition: Home Condition at discharge: good  CODE STATUS: FULL CODE     Discharge Physician: Sunnie Nielsen, DO [facility]     PCP: Nelwyn Salisbury, MD  Recommendations for Outpatient Follow-up:  Follow up with PCP Nelwyn Salisbury, MD in 1-2 weeks Please obtain labs/tests: CBC, BMP, TSH in 1-2 weeks Follow as directed with cardiology and oncology  Please follow up on the following pending results: none PCP AND OTHER OUTPATIENT PROVIDERS: SEE BELOW FOR SPECIFIC DISCHARGE INSTRUCTIONS PRINTED FOR PATIENT IN ADDITION TO GENERIC AVS PATIENT INFO    Discharge Instructions     Call MD for:  persistant dizziness or light-headedness   Complete by: As directed    Call MD for:  severe uncontrolled pain   Complete by: As directed    Diet - low sodium heart healthy   Complete by: As directed    Increase activity slowly   Complete by: As directed          Discharge Diagnoses: Principal Problem:   Atrial flutter with rapid ventricular response Active Problems:   Cholangiocarcinoma   Antiphospholipid syndrome   DVT (deep venous thrombosis)   Hyperlipemia   Essential hypertension   Hypomagnesemia   Hypokalemia   Macrocytic anemia   Diabetes mellitus without complication   Dyspnea   Obesity (BMI 30-39.9)   Pleural effusion   Acute diastolic CHF (congestive heart failure)       Hospital Course: Christopher Marsh is a 62 y.o. male with medical history significant of antiphospholipid syndrome with recurrent DVTs on full dose of Lovenox, stage IV cholangiocarcinoma with bone metastasis on chemotherapy and with XRT to bone lesions, HTN, HLD, DM, who presents to ED from oncology clinic on 02/13/2023 with tachycardia and SOB. EKG done in office showed A flutter with  variable A-V block.  Heart rate up to 140.  04/17:  pt was found to have BNP 119.8, WBC 3.7, hemoglobin 8.5 (8.6 on 02/10/2023, and 15.4 on 11/08/2022), GFR> 60, troponin level 11, potassium 3.4, temperature normal, blood pressure 149/81, RR 21, oxygen saturation 99% on room air.  CTA: no PE, (+)cardiomegaly and CAD. Patient is admitted to PCU as inpatient.  Dr. Cathie Hoops of oncology and Dr. Okey Dupre of cardiology are consulted. HR improved to 90s. Metoprolol tartrate 12.5 mg q6h, IV BB prn, Echo ordered as well as diuresis. 04/18: Echo no overt CHF - EF 50-55 but LV low normal fxn no RWMA and indeterminate diastolic parameters. Telemetry no concerns. Cardiology recs adjustment for beta blocker to metoprolol succinate 50 mg bid. Onc to see today. Adjusting Lovenox - he was not using bid, pharmacy involved to get levels tonight and adjust dose as needed but goal about 140 mg bid    Consultants:  Cardiology Oncology  Procedures: None       ASSESSMENT & PLAN:   Atrial flutter with rapid ventricular response CHADS2VASc 3 (HTN, DM, vascular disease)  HT up to 140s --> 90s now.  Cardiology to adjust rate control, consider rhythm control Metoprolol titrate, 12.5 mg every 6 hours --> 50 mg bid -->  to titrated dose of 75 mg bid, titrate as indicated  As needed IV metoprolol 5 mg every 2 hour for heart rate> 125 Plan subcutaneous Lovenox 140 mg twice daily which  is also for DVT, minimum of 4 weeks of uninterrupted therapeutic anticoagulation prior to pursuing DCCV    Lower extremity edema d/t DVT Low normal LV fxn  Torsemide   Cholangiocarcinoma:  pt is on chemotherapy Follow w/ Dr. Cathie Hoops of oncology.   Antiphospholipid syndrome and DVT (deep venous thrombosis) continue home Lovenox 140 mg twice daily --> 14 DAYS RX GIVEN THEN NEED TO RECALCULATE WEIGHT BASED DOSE  Follow w/ Dr. Cathie Hoops of oncology.   Hyperlipemia Pravastatin   Essential hypertension IV hydralazine as needed Metoprolol Amlodipine    Hypomagnesemia and hypokalemia:  Potassium 3.4, magnesium 1.8 Repleted both Check phosphorus level --> 3.3   Macrocytic anemia:  Hemoglobin stable 8.5 (8.6 on 02/11/2023) Follow-up with CBC   Diabetes mellitus without complication:  Recent A1c 5.8.  Well-controlled.   Patient taking metformin and glipizide at home Sliding scale insulin   SOB (shortness of breath): improved  Etiology is not clear.  CTA negative for PE.  No signs of pneumonia.  BNP 119.  NO overt CHF on Echo   Obesity (BMI 30-39.9): Body weight 140.2 kg, BMI 38.63 Encourage losing weight Exercise and healthy diet                 Discharge Instructions  Allergies as of 02/15/2023   No Known Allergies      Medication List     STOP taking these medications    amLODipine 10 MG tablet Commonly known as: NORVASC   furosemide 20 MG tablet Commonly known as: LASIX   levofloxacin 750 MG tablet Commonly known as: LEVAQUIN       TAKE these medications    albuterol 108 (90 Base) MCG/ACT inhaler Commonly known as: VENTOLIN HFA TAKE 2 PUFFS BY MOUTH EVERY 4 HOURS AS NEEDED   CALCIUM 500 + D PO Take 1 tablet by mouth daily.   dexamethasone 4 MG tablet Commonly known as: DECADRON TAKE 2 TABLETS DAILY X 3 DAYS STARTING THE DAY AFTER CISPLATIN CHEMOTHERAPY. TAKE WITH FOOD.   enoxaparin 150 MG/ML injection Commonly known as: LOVENOX Inject 0.94 mLs (140 mg total) into the skin every 12 (twelve) hours for 14 days. What changed:  medication strength See the new instructions.   glipiZIDE 10 MG tablet Commonly known as: GLUCOTROL Take 1 tablet (10 mg total) by mouth 2 (two) times daily before a meal. What changed: when to take this   glucose blood test strip 1 each by Other route as needed for other. accu chek aviva plusUse as instructed   lidocaine-prilocaine cream Commonly known as: EMLA Apply 1 Application topically as needed.   LORazepam 0.5 MG tablet Commonly known as:  ATIVAN TAKE 1 TABLET (0.5 MG TOTAL) BY MOUTH EVERY 8 (EIGHT) HOURS AS NEEDED FOR ANXIETY (NAUSEA VOMITING).   magnesium chloride 64 MG Tbec SR tablet Commonly known as: SLOW-MAG Take 1 tablet (64 mg total) by mouth daily. What changed: when to take this   metFORMIN 500 MG tablet Commonly known as: GLUCOPHAGE TAKE TWO TABLETS BY MOUTH EVERY MORNING WITH BREAKFAST   Metoprolol Tartrate 75 MG Tabs Take 1 tablet (75 mg total) by mouth 2 (two) times daily.   Mucinex D Max Strength [phone] MG Tb12 Generic drug: Pseudoephedrine-Guaifenesin Take 1,200 mg by mouth 2 (two) times daily as needed (for congestion or to loosen mucous in the chest).   ondansetron 8 MG tablet Commonly known as: ZOFRAN Take 1 tablet (8 mg total) by mouth every 8 (eight) hours as needed for nausea or vomiting.  Start on the third day after cisplatin.   potassium chloride SA 20 MEQ tablet Commonly known as: KLOR-CON M Take 2 tablets (40 mEq total) by mouth daily for 3 doses.   pravastatin 20 MG tablet Commonly known as: PRAVACHOL Take 1 tablet (20 mg total) by mouth daily.   prochlorperazine 10 MG tablet Commonly known as: COMPAZINE TAKE 1 TABLET (10 MG TOTAL) BY MOUTH EVERY 6 HOURS AS NEEDED FOR NAUSEA AND VOMITING   tadalafil 20 MG tablet Commonly known as: CIALIS Take 0.5-1 tablets (10-20 mg total) by mouth every other day as needed for erectile dysfunction.   Torsemide 40 MG Tabs Take 40 mg by mouth daily. Increase to 1 tablet (40 mg total) by mouth TWICE daily (total daily dose 80 mg) as needed for up to 3 days for increased leg swelling, shortness of breath, weight gain 5+ lbs over 1-2 days. Seek medical care if these symptoms are not improving with increased dose.   triamcinolone cream 0.1 % Commonly known as: KENALOG Apply 1 Application topically 3 (three) times daily. What changed:  when to take this reasons to take this          No Known Allergies   Subjective: pt with no concerns  today, no CP/palpitations, no SOB, edema is improved, decent UOP   Discharge Exam: BP 110/67   Pulse 96   Temp 98.4 F (36.9 C)   Resp 18   Ht 6\' 3"  (1.905 m)   Wt 135.2 kg   SpO2 95%   BMI 37.26 kg/m  General: Pt is alert, awake, not in acute distress Cardiovascular: RRR, S1/S2 +, no rubs, no gallops Respiratory: CTA bilaterally, no wheezing, no rhonchi Abdominal: Soft, NT, ND, bowel sounds + Extremities: + LE edema, no cyanosis      The results of significant diagnostics from this hospitalization (including imaging, microbiology, ancillary and laboratory) are listed below for reference.     Microbiology: No results found for this or any previous visit (from the past 240 hour(s)).   Labs: BNP (last 3 results) Recent Labs    09/15/22 1645 02/11/23 1129 02/13/23 1101  BNP 29.2 180.2* 119.8*   Basic Metabolic Panel: Recent Labs  Lab 02/11/23 1129 02/13/23 0808 02/13/23 1010 02/14/23 0540 02/15/23 0447  NA 136 137  --  138 137  K 3.6 3.4*  --  3.6 3.4*  CL 103 106  --  104 102  CO2 22 23  --  25 26  GLUCOSE 107* 113*  --  118* 130*  BUN 7* 7*  --  6* 7*  CREATININE 0.84 0.89  --  0.76 0.69  CALCIUM 8.7* 8.3*  --  8.0* 8.1*  MG 1.6* 1.5*  --   --  1.7  PHOS  --   --  3.3  --   --    Liver Function Tests: Recent Labs  Lab 02/11/23 1129 02/13/23 0808  AST 26 24  ALT 17 16  ALKPHOS 50 52  BILITOT 0.6 0.4  PROT 6.2* 6.0*  ALBUMIN 3.4* 3.5   No results for input(s): "LIPASE", "AMYLASE" in the last 168 hours. No results for input(s): "AMMONIA" in the last 168 hours. CBC: Recent Labs  Lab 02/11/23 1130 02/13/23 0808 02/14/23 0540  WBC 2.4* 3.7* 2.6*  NEUTROABS 1.7 2.5  --   HGB 8.6* 8.5* 8.3*  HCT 27.0* 27.0* 26.8*  MCV 102.7* 103.8* 103.9*  PLT 88* 186 212   Cardiac Enzymes: No results for input(s): "CKTOTAL", "CKMB", "  CKMBINDEX", "TROPONINI" in the last 168 hours. BNP: Invalid input(s): "POCBNP" CBG: Recent Labs  Lab 02/14/23 0745  02/14/23 1217 02/14/23 1742 02/14/23 2110 02/15/23 0854  GLUCAP 128* 191* 170* 156* 144*   D-Dimer No results for input(s): "DDIMER" in the last 72 hours. Hgb A1c No results for input(s): "HGBA1C" in the last 72 hours. Lipid Profile No results for input(s): "CHOL", "HDL", "LDLCALC", "TRIG", "CHOLHDL", "LDLDIRECT" in the last 72 hours. Thyroid function studies Recent Labs    02/13/23 1010  TSH 0.269*   Anemia work up No results for input(s): "VITAMINB12", "FOLATE", "FERRITIN", "TIBC", "IRON", "RETICCTPCT" in the last 72 hours. Urinalysis    Component Value Date/Time   COLORURINE YELLOW 09/15/2022 1549   APPEARANCEUR CLEAR 09/15/2022 1549   LABSPEC 1.020 09/15/2022 1549   PHURINE 5.0 09/15/2022 1549   GLUCOSEU NEGATIVE 09/15/2022 1549   HGBUR NEGATIVE 09/15/2022 1549   HGBUR negative 11/23/2009 0844   BILIRUBINUR NEGATIVE 09/15/2022 1549   BILIRUBINUR 1+ 11/18/2017 1600   KETONESUR NEGATIVE 09/15/2022 1549   PROTEINUR NEGATIVE 09/15/2022 1549   UROBILINOGEN 4.0 (A) 11/18/2017 1600   UROBILINOGEN 0.2 11/23/2009 0844   NITRITE NEGATIVE 09/15/2022 1549   LEUKOCYTESUR NEGATIVE 09/15/2022 1549   Sepsis Labs Recent Labs  Lab 02/11/23 1130 02/13/23 0808 02/14/23 0540  WBC 2.4* 3.7* 2.6*   Microbiology No results found for this or any previous visit (from the past 240 hour(s)). Imaging ECHOCARDIOGRAM COMPLETE  Result Date: 02/14/2023    ECHOCARDIOGRAM REPORT   Patient Name:   TIMMEY LAMBA Date of Exam: 02/13/2023 Medical Rec #:  [id number]        Height:       75.0 in Accession #:    [id number]       Weight:       309.1 lb Date of Birth:  [birth year]         BSA:          2.640 m Patient Age:    61 years         BP:           151/77 mmHg Patient Gender: M                HR:           95 bpm. Exam Location:  [facility] Procedure: 2D Echo, Cardiac Doppler and Color Doppler Indications:     R06.00 Dyspnea  History:         Patient has prior history of Echocardiogram examinations,  most                  recent 08/19/2022. Signs/Symptoms:Dyspnea; Risk                  Factors:Hypertension, Dyslipidemia and Diabetes. Lower                  extremity edema.  Sonographer:     Daphine Deutscher RDCS Referring Phys:  [id number] SHERI HAMMOCK Diagnosing Phys: Julien Nordmann MD IMPRESSIONS  1. Left ventricular ejection fraction, by estimation, is 50 to 55%. The left ventricle has low normal function. The left ventricle has no regional wall motion abnormalities. Left ventricular diastolic parameters are indeterminate.  2. Right ventricular systolic function is normal. The right ventricular size is normal. Tricuspid regurgitation signal is inadequate for assessing PA pressure.  3. The mitral valve is normal in structure. Mild mitral valve regurgitation. No evidence of mitral stenosis.  4. The aortic valve has an indeterminant number of  cusps. Aortic valve regurgitation is not visualized. Aortic valve sclerosis is present, with no evidence of aortic valve stenosis.  5. The inferior vena cava is dilated in size with <50% respiratory variability, suggesting right atrial pressure of 15 mmHg. FINDINGS  Left Ventricle: Left ventricular ejection fraction, by estimation, is 50 to 55%. The left ventricle has low normal function. The left ventricle has no regional wall motion abnormalities. The left ventricular internal cavity size was normal in size. There is no left ventricular hypertrophy. Left ventricular diastolic parameters are indeterminate. Right Ventricle: The right ventricular size is normal. No increase in right ventricular wall thickness. Right ventricular systolic function is normal. Tricuspid regurgitation signal is inadequate for assessing PA pressure. Left Atrium: Left atrial size was normal in size. Right Atrium: Right atrial size was normal in size. Pericardium: There is no evidence of pericardial effusion. Mitral Valve: The mitral valve is normal in structure. Mild mitral valve regurgitation.  No evidence of mitral valve stenosis. Tricuspid Valve: The tricuspid valve is normal in structure. Tricuspid valve regurgitation is not demonstrated. No evidence of tricuspid stenosis. Aortic Valve: The aortic valve has an indeterminant number of cusps. Aortic valve regurgitation is not visualized. Aortic valve sclerosis is present, with no evidence of aortic valve stenosis. Pulmonic Valve: The pulmonic valve was normal in structure. Pulmonic valve regurgitation is not visualized. No evidence of pulmonic stenosis. Aorta: The aortic root is normal in size and structure. Venous: The inferior vena cava is dilated in size with less than 50% respiratory variability, suggesting right atrial pressure of 15 mmHg. IAS/Shunts: No atrial level shunt detected by color flow Doppler.  LEFT VENTRICLE PLAX 2D LVIDd:         5.10 cm LVIDs:         3.60 cm LV PW:         0.80 cm LV IVS:        0.80 cm LVOT diam:     2.30 cm LV SV:         98 LV SV Index:   37 LVOT Area:     4.15 cm  RIGHT VENTRICLE             IVC RV Basal diam:  3.90 cm     IVC diam: 2.90 cm RV S prime:     16.80 cm/s TAPSE (M-mode): 2.2 cm LEFT ATRIUM             Index        RIGHT ATRIUM           Index LA diam:        4.80 cm 1.82 cm/m   RA Area:     19.10 cm LA Vol (A2C):   59.9 ml 22.69 ml/m  RA Volume:   52.80 ml  20.00 ml/m LA Vol (A4C):   75.2 ml 28.48 ml/m LA Biplane Vol: 70.3 ml 26.62 ml/m  AORTIC VALVE LVOT Vmax:   133.00 cm/s LVOT Vmean:  96.950 cm/s LVOT VTI:    0.236 m  AORTA Ao Root diam: 3.90 cm Ao Asc diam:  3.30 cm MV E velocity: 126.25 cm/s                             SHUNTS                             Systemic VTI:  0.24 m  Systemic Diam: 2.30 cm Julien Nordmann MD Electronically signed by Julien Nordmann MD Signature Date/Time: 02/14/2023/11:51:33 AM    Final    CT Angio Chest PE W and/or Wo Contrast  Result Date: 02/13/2023 CLINICAL DATA:  Pulmonary embolism suspected. High probability DVT. Dyspnea. EXAM: CT  ANGIOGRAPHY CHEST WITH CONTRAST TECHNIQUE: Multidetector CT imaging of the chest was performed using the standard protocol during bolus administration of intravenous contrast. Multiplanar CT image reconstructions and MIPs were obtained to evaluate the vascular anatomy. RADIATION DOSE REDUCTION: This exam was performed according to the departmental dose-optimization program which includes automated exposure control, adjustment of the mA and/or kV according to patient size and/or use of iterative reconstruction technique. CONTRAST:  50mL OMNIPAQUE IOHEXOL 350 MG/ML SOLN COMPARISON:  02/01/2023 FINDINGS: Cardiovascular: Heart is mildly enlarged. No pericardial effusion. There are coronary artery calcifications. The pulmonary arteries are moderately well opacified and there is no acute pulmonary embolus. RIGHT-sided port tip extends to the level of the LOWER superior vena cava/UPPER RIGHT atrium. Mediastinum/Nodes: The LEFT lobe of the thyroid gland is enlarged and nodular, consistent with goiter. The appearance is unchanged. Esophagus is unremarkable. There are scattered sub centimeter mediastinal and axillary lymph nodes. Lungs/Pleura: Airways are patent. There are bilateral pleural effusions. LEFT LOWER lobe airspace filling and atelectasis are noted, slightly improved compared to prior study. There has been improvement in RIGHT UPPER and LOWER lobe aeration compared to prior study. Upper Abdomen: There is marked heterogeneous appearance of the liver. The mass previously noted in the RIGHT hepatic lobe is estimated to measure 7.7 centimeters. Cirrhotic morphology of the liver. There is adenopathy in the gastrohepatic ligament, largest node measuring 1 6 centimeters in short axis. Musculoskeletal: There are degenerative changes in the spine. A sclerotic lesion in the UPPER sternum is 7 millimeters, suspicious for metastatic disease. Review of the MIP images confirms the above findings. IMPRESSION: 1. Technically  adequate exam showing no acute pulmonary embolus. 2. Cardiomegaly. Coronary artery calcifications. 3. Bilateral pleural effusions. 4. Improved aeration of the RIGHT UPPER and LOWER lobes compared to prior study. 5. LEFT LOWER lobe and lingula airspace filling and atelectasis, slightly improved compared to prior study. 6. Cirrhotic morphology of the liver. 7.7 centimeters mass in the RIGHT hepatic lobe. 7. Gastrohepatic ligament adenopathy. 8. A 7 millimeters sclerotic lesion in the UPPER sternum is suspicious for metastatic disease. 9. LEFT thyroid goiter. Electronically Signed   By: Norva Pavlov M.D.   On: 02/13/2023 12:09      Time coordinating discharge: over 30 minutes  SIGNED:  Sunnie Nielsen DO [facility]

## 2023-02-15 NOTE — TOC Initial Note (Signed)
Transition of Care [facility]) - Initial/Assessment Note    Patient Details  Name: Christopher Marsh MRN: [id number] Date of Birth: 1961/06/13  Transition of Care [facility]) CM/SW Contact:    Truddie Hidden, RN Phone Number: 02/15/2023, 11:09 AM  Clinical Narrative:                 Risk assessment completed   Admitted WUJ:[id number] and SOB related to A Flutter and Atrial flutter with rapid ventricular response  Admitted from: Home. Lives with his wife  PCP: Gershon Crane  Pharmacy: [facility]  Current home health/prior home health/DME: [facility]   Expected Discharge Plan: Home/Self Care Barriers to Discharge: Barriers Resolved   Patient Goals and CMS Choice Patient states their goals for this hospitalization and ongoing recovery are:: HOME          Expected Discharge Plan and Services       Living arrangements for the past 2 months: Single Family Home Expected Discharge Date: 02/15/23                                    Prior Living Arrangements/Services Living arrangements for the past 2 months: Single Family Home Lives with:: Spouse Patient language and need for interpreter reviewed:: Yes Do you feel safe going back to the place where you live?: Yes      Need for Family Participation in Patient Care: Yes (Comment) Care giver support system in place?: Yes (comment)   Criminal Activity/Legal Involvement Pertinent to Current Situation/Hospitalization: No - Comment as needed  Activities of Daily Living Home Assistive Devices/Equipment: Eyeglasses ADL Screening (condition at time of admission) Patient's cognitive ability adequate to safely complete daily activities?: Yes Is the patient deaf or have difficulty hearing?: No Does the patient have difficulty seeing, even when wearing glasses/contacts?: No Does the patient have difficulty concentrating, remembering, or making decisions?: No Patient able to express need for assistance with ADLs?: Yes Does the patient  have difficulty dressing or bathing?: No Independently performs ADLs?: Yes (appropriate for developmental age) Does the patient have difficulty walking or climbing stairs?: No Weakness of Legs: None Weakness of Arms/Hands: None  Permission Sought/Granted                  Emotional Assessment   Attitude/Demeanor/Rapport: Gracious, Engaged Affect (typically observed): Accepting Orientation: : Oriented to Self, Oriented to Place, Oriented to  Time, Oriented to Situation Alcohol / Substance Use: Not Applicable Psych Involvement: No (comment)  Admission diagnosis:  Pleural effusion [J90] Atrial flutter with rapid ventricular response [I48.92] Dyspnea, unspecified type [R06.00] Deep vein thrombosis (DVT) of proximal vein of both lower extremities, unspecified chronicity [I82.4Y3] Patient Active Problem List   Diagnosis Date Noted   Pleural effusion 02/14/2023   Acute diastolic CHF (congestive heart failure) 02/14/2023   Tachycardia 02/13/2023   Atrial flutter with rapid ventricular response 02/13/2023   DVT (deep venous thrombosis) 02/13/2023   Hypokalemia 02/13/2023   Obesity (BMI 30-39.9) 02/13/2023   Diabetes mellitus without complication 02/13/2023   Macrocytic anemia 02/13/2023   Dyspnea 02/13/2023   Antineoplastic chemotherapy induced anemia 01/23/2023   Extravasation accident 01/02/2023   Hypomagnesemia 01/02/2023   Metastasis to bone 12/12/2022   Sore throat 11/21/2022   Rhinitis 11/21/2022   Hyperkalemia 11/21/2022   Encounter for antineoplastic chemotherapy 10/10/2022   Lung infiltrate on CT 10/10/2022   Hypocalcemia 10/10/2022   Goals of care, counseling/discussion 08/30/2022  Antiphospholipid syndrome 08/30/2022   Cholangiocarcinoma 08/27/2022   Multifocal pneumonia 08/19/2022   Hepatic lesion 08/19/2022   Snoring 08/19/2022   Primary insomnia 08/19/2022   Liver mass, right lobe 08/19/2022   Pneumonia 08/18/2022   Lymphedema 04/20/2022   Erythrocytosis  12/18/2021   COVID-19 virus infection 09/01/2020   Status post total replacement of hip 07/22/2017   Essential hypertension 08/28/2016   Actinic keratosis 11/30/2015   DM2 (diabetes mellitus, type 2) 02/15/2015   Morbid obesity (HCC) 06/18/2013   Left hip pain 04/02/2012   HSV (herpes simplex virus) infection 04/02/2012   CONTACT DERMATITIS&OTHER ECZEMA DUE UNSPEC CAUSE 09/14/2009   ERECTILE DYSFUNCTION 12/08/2008   Venous (peripheral) insufficiency 12/08/2008   Hyperlipemia 11/24/2008   ESOPHAGITIS, REFLUX 11/18/2008   PCP:  Nelwyn Salisbury, MD Pharmacy:   [phone] [place], [place] - [address] [place] [address] Phone: [phone] Fax: [phone]     Social Determinants of Health (SDOH) Social History: SDOH Screenings   Food Insecurity: No Food Insecurity (02/14/2023)  Housing: Low Risk  (02/14/2023)  Transportation Needs: No Transportation Needs (02/14/2023)  Utilities: Not At Risk (02/14/2023)  Alcohol Screen: Low Risk  (08/27/2022)  Depression (PHQ2-9): Low Risk  (08/27/2022)  Financial Resource Strain: Low Risk  (08/27/2022)  Physical Activity: Insufficiently Active (08/27/2022)  Social Connections: Socially Integrated (08/27/2022)  Stress: Stress Concern Present (08/27/2022)  Tobacco Use: Medium Risk (02/14/2023)   SDOH Interventions:     Readmission Risk Interventions    02/15/2023   11:06 AM 09/17/2022    8:56 AM 09/17/2022    8:51 AM  Readmission Risk Prevention Plan  Transportation Screening Complete Complete Complete  PCP or Specialist Appt within 5-7 Days  Complete Complete  PCP or Specialist Appt within 3-5 Days Complete    Home Care Screening  Complete Complete  Medication Review (RN CM)  Complete Complete  HRI or Home Care Consult Complete    Social Work Consult for Recovery Care Planning/Counseling Complete    Palliative Care Screening Not Applicable    Medication Review Oceanographer) Complete

## 2023-02-18 ENCOUNTER — Telehealth: Payer: Self-pay

## 2023-02-18 ENCOUNTER — Encounter: Payer: Self-pay | Admitting: Oncology

## 2023-02-18 ENCOUNTER — Telehealth: Payer: Self-pay | Admitting: Pharmacist

## 2023-02-18 ENCOUNTER — Other Ambulatory Visit (HOSPITAL_COMMUNITY): Payer: Self-pay

## 2023-02-18 DIAGNOSIS — C221 Intrahepatic bile duct carcinoma: Secondary | ICD-10-CM

## 2023-02-18 NOTE — Telephone Encounter (Signed)
Oral Oncology Patient Advocate Encounter   Was successful in obtaining a copay card for Lytgobi.  This copay card will make the patients copay as low as $0.00.  The billing information is as follows and has been shared with [facility].   RxBin: [id number] PCN: [id number] Member ID: [id number] Group ID: [id number]   Ardeen Fillers, CPhT Oncology Pharmacy Patient Advocate  [facility]  [phone] (phone) [phone] (fax) 02/18/2023 9:47 AM

## 2023-02-18 NOTE — Telephone Encounter (Signed)
Oral Oncology Patient Advocate Encounter  New authorization   Received notification that prior authorization for Lytgobi is required.   PA submitted on 02/18/23  Key [id number]  Status is pending     Ardeen Fillers, CPhT Oncology Pharmacy Patient Advocate  [facility]  [phone] (phone) [phone] (fax) 02/18/2023 8:17 AM

## 2023-02-18 NOTE — Telephone Encounter (Signed)
Dr. Cathie Hoops has adjusted IS for lab/MD only this week, he will not get tx.   Please schedule lab to be same day as MD on 4/24 and inform pt of updated appts. Per Dr. Cathie Hoops, pt is aware of change of plans.

## 2023-02-18 NOTE — Telephone Encounter (Addendum)
Oral Oncology Pharmacist Encounter  Received new prescription for futibatinib for the treatment of metastatic cholangiocarcinoma with FGFR2-ADK mutation, planned duration until disease progression or unacceptable toxicity.  Labs from 02/15/23 assessed, appropriate for initiation. No interventions needed. Prescription dose and frequency assessed for appropriateness.    Current medication list in Epic reviewed, no DDIs identified.  Evaluated chart and no patient barriers to medication adherence noted.   Patient agreement for treatment documented in MD note on 02/12/26.  Prescription has been e-scribed to the [facility] for benefits analysis and approval.  Oral Oncology Clinic will continue to follow for insurance authorization, copayment issues, initial counseling and start date.  Lennie Muckle, PharmD Candidate 02/18/2023 2:11 PM Oral Oncology Clinic [phone]

## 2023-02-18 NOTE — Transitions of Care (Post Inpatient/ED Visit) (Signed)
   02/18/2023  Name: Christopher Marsh MRN: [id number] DOB: January 20, 1961  Today's TOC FU Call Status: Today's TOC FU Call Status:: Successful TOC FU Call Competed TOC FU Call Complete Date: 02/18/23  Transition Care Management Follow-up Telephone Call Date of Discharge: 02/15/23 Discharge Facility: [facility]) Type of Discharge: Inpatient Admission Primary Inpatient Discharge Diagnosis:: "atrial flutter w/ RVR" How have you been since you were released from the hospital?: Better (Pt states he is "still a ltitle tired but feeling better overall today."he is resting well. Appetitie fair. LBM yest. He has had no cardiac issues-no swelling.) Any questions or concerns?: Yes Patient Questions/Concerns:: Pt wanting to know if Torsemide should be taken daily or as needed. Patient Questions/Concerns Addressed: Other: (Reviewed d/c instructions with med review and cardiology inpatient note advising to take Torsemide daily-pt voiced understanding and has been doing so. He does have a scale in the home but does not weigh often-encouraged to do so.)  Items Reviewed: Did you receive and understand the discharge instructions provided?: Yes Medications obtained and verified?: Yes (Medications Reviewed) Any new allergies since your discharge?: No Dietary orders reviewed?: Yes Type of Diet Ordered:: low salt/heart healthy Do you have support at home?: Yes People in Home: spouse Name of Support/Comfort Primary Source: [facility] and Equipment/Supplies: Were Home Health Services Ordered?: NA Any new equipment or medical supplies ordered?: NA  Functional Questionnaire: Do you need assistance with bathing/showering or dressing?: No Do you need assistance with meal preparation?: No Do you need assistance with eating?: No Do you have difficulty maintaining continence: No Do you need assistance with getting out of bed/getting out of a chair/moving?: No Do you have  difficulty managing or taking your medications?: No  Follow up appointments reviewed: PCP Follow-up appointment confirmed?: No MD Provider Line Number:[phone] Given: No Specialist Hospital Follow-up appointment confirmed?: Yes Date of Specialist follow-up appointment?: 02/20/23 Follow-Up Specialty Provider:: Dr. Cathie Hoops, Dr Gollan-03/13/23 Do you need transportation to your follow-up appointment?: No Do you understand care options if your condition(s) worsen?: Yes-patient verbalized understanding  SDOH Interventions Today    Flowsheet Row Most Recent Value  SDOH Interventions   Food Insecurity Interventions Intervention Not Indicated  Transportation Interventions Intervention Not Indicated       Interventions Today    Flowsheet Row Most Recent Value  Chronic Disease   Chronic disease during today's visit Other  [atrial flutter]  General Interventions   General Interventions Discussed/Reviewed General Interventions Discussed, Doctor Visits  Doctor Visits Discussed/Reviewed Doctor Visits Discussed, PCP, Specialist  [pt declined scheudlign PCP appt-states he is seeing oncology and cardiology who are managing his care at this time]  PCP/Specialist Visits Compliance with follow-up visit  Education Interventions   Education Provided Provided Education  Provided Verbal Education On Nutrition, When to see the doctor, Medication, Other  [wgt monitoring, s/s of fluid retention and when to notify MD]  Nutrition Interventions   Nutrition Discussed/Reviewed Nutrition Discussed, Adding fruits and vegetables, Decreasing salt  Pharmacy Interventions   Pharmacy Dicussed/Reviewed Pharmacy Topics Discussed, Medications and their functions  Safety Interventions   Safety Discussed/Reviewed Safety Discussed       Alessandra Grout [facility] Care Management Care Management Community Coordinator Direct Phone: [phone] Toll Free: [phone] Fax: [phone]

## 2023-02-18 NOTE — Telephone Encounter (Signed)
-----   Message from [name], MD sent at 02/15/2023  8:49 PM EDT ----- Cyndra Numbers,  I plan to start him on FGFR inhibitors, either Futibatinib, or Pemigatinib or Infigratinib, whichever is covered by his insurance. My preference is  Futibatinib>Pemigatinib = Infigratinib  Team, he will not get treatment next week. Keep lab MD appt. I will update chemo IS

## 2023-02-19 ENCOUNTER — Other Ambulatory Visit: Payer: Self-pay

## 2023-02-19 ENCOUNTER — Encounter: Payer: Self-pay | Admitting: Oncology

## 2023-02-19 ENCOUNTER — Inpatient Hospital Stay: Payer: BC Managed Care – PPO

## 2023-02-19 ENCOUNTER — Other Ambulatory Visit (HOSPITAL_COMMUNITY): Payer: Self-pay

## 2023-02-19 ENCOUNTER — Ambulatory Visit
Admission: RE | Admit: 2023-02-19 | Discharge: 2023-02-19 | Disposition: A | Discharge status: Home / Self Care | Payer: BC Managed Care – PPO | Source: Ambulatory Visit | Attending: Radiation Oncology | Admitting: Radiation Oncology

## 2023-02-19 ENCOUNTER — Other Ambulatory Visit: Payer: Self-pay | Admitting: Oncology

## 2023-02-19 DIAGNOSIS — C221 Intrahepatic bile duct carcinoma: Secondary | ICD-10-CM

## 2023-02-19 DIAGNOSIS — D6861 Antiphospholipid syndrome: Secondary | ICD-10-CM | POA: Diagnosis not present

## 2023-02-19 DIAGNOSIS — Z5111 Encounter for antineoplastic chemotherapy: Secondary | ICD-10-CM | POA: Diagnosis not present

## 2023-02-19 DIAGNOSIS — Z510 Encounter for antineoplastic radiation therapy: Secondary | ICD-10-CM | POA: Diagnosis not present

## 2023-02-19 DIAGNOSIS — C7951 Secondary malignant neoplasm of bone: Secondary | ICD-10-CM | POA: Diagnosis not present

## 2023-02-19 DIAGNOSIS — D6481 Anemia due to antineoplastic chemotherapy: Secondary | ICD-10-CM | POA: Diagnosis not present

## 2023-02-19 DIAGNOSIS — R0602 Shortness of breath: Secondary | ICD-10-CM | POA: Diagnosis not present

## 2023-02-19 DIAGNOSIS — Z86718 Personal history of other venous thrombosis and embolism: Secondary | ICD-10-CM | POA: Diagnosis not present

## 2023-02-19 DIAGNOSIS — R600 Localized edema: Secondary | ICD-10-CM | POA: Diagnosis not present

## 2023-02-19 DIAGNOSIS — R978 Other abnormal tumor markers: Secondary | ICD-10-CM | POA: Diagnosis not present

## 2023-02-19 LAB — CBC WITH DIFFERENTIAL/PLATELET
Abs Immature Granulocytes: 0.02 10*3/uL (ref 0.00–0.07)
Basophils Absolute: 0 10*3/uL (ref 0.0–0.1)
Basophils Relative: 1 %
Eosinophils Absolute: 0 10*3/uL (ref 0.0–0.5)
Eosinophils Relative: 0 %
HCT: 32 % — ABNORMAL LOW (ref 39.0–52.0)
Hemoglobin: 9.9 g/dL — ABNORMAL LOW (ref 13.0–17.0)
Immature Granulocytes: 1 %
Lymphocytes Relative: 11 %
Lymphs Abs: 0.3 10*3/uL — ABNORMAL LOW (ref 0.7–4.0)
MCH: 31.3 pg (ref 26.0–34.0)
MCHC: 30.9 g/dL (ref 30.0–36.0)
MCV: 101.3 fL — ABNORMAL HIGH (ref 80.0–100.0)
Monocytes Absolute: 0.8 10*3/uL (ref 0.1–1.0)
Monocytes Relative: 28 %
Neutro Abs: 1.8 10*3/uL (ref 1.7–7.7)
Neutrophils Relative %: 59 %
Platelets: 385 10*3/uL (ref 150–400)
RBC: 3.16 MIL/uL — ABNORMAL LOW (ref 4.22–5.81)
RDW: 14.6 % (ref 11.5–15.5)
WBC: 2.9 10*3/uL — ABNORMAL LOW (ref 4.0–10.5)
nRBC: 0 % (ref 0.0–0.2)

## 2023-02-19 LAB — RAD ONC ARIA SESSION SUMMARY
Course Elapsed Days: 43
Plan Fractions Treated to Date: 4
Plan Prescribed Dose Per Fraction: 6 Gy
Plan Total Fractions Prescribed: 5
Plan Total Prescribed Dose: 30 Gy
Reference Point Dosage Given to Date: 24 Gy
Reference Point Session Dosage Given: 6 Gy
Session Number: 9

## 2023-02-19 LAB — COMPREHENSIVE METABOLIC PANEL
ALT: 14 U/L (ref 0–44)
AST: 26 U/L (ref 15–41)
Albumin: 3.5 g/dL (ref 3.5–5.0)
Alkaline Phosphatase: 49 U/L (ref 38–126)
Anion gap: 13 (ref 5–15)
BUN: 9 mg/dL (ref 8–23)
CO2: 23 mmol/L (ref 22–32)
Calcium: 8.1 mg/dL — ABNORMAL LOW (ref 8.9–10.3)
Chloride: 99 mmol/L (ref 98–111)
Creatinine, Ser: 0.93 mg/dL (ref 0.61–1.24)
GFR, Estimated: >60 mL/min (ref >60)
Glucose, Bld: 141 mg/dL — ABNORMAL HIGH (ref 70–99)
Potassium: 3.7 mmol/L (ref 3.5–5.1)
Sodium: 135 mmol/L (ref 135–145)
Total Bilirubin: 0.6 mg/dL (ref 0.3–1.2)
Total Protein: 6.4 g/dL — ABNORMAL LOW (ref 6.5–8.1)

## 2023-02-19 LAB — RETIC PANEL
Immature Retic Fract: 25.2 % — ABNORMAL HIGH (ref 2.3–15.9)
RBC.: 3.2 MIL/uL — ABNORMAL LOW (ref 4.22–5.81)
Retic Count, Absolute: 164.8 10*3/uL (ref 19.0–186.0)
Retic Ct Pct: 5.2 % — ABNORMAL HIGH (ref 0.4–3.1)
Reticulocyte Hemoglobin: 25.6 pg — ABNORMAL LOW (ref >27.9)

## 2023-02-19 LAB — MAGNESIUM: Magnesium: 1.5 mg/dL — ABNORMAL LOW (ref 1.7–2.4)

## 2023-02-19 LAB — PHOSPHORUS: Phosphorus: 3.3 mg/dL (ref 2.5–4.6)

## 2023-02-19 MED ORDER — FUTIBATINIB (20 MG DAILY DOSE) 4 MG PO TBPK
20.0000 mg | ORAL_TABLET | Freq: Every day | ORAL | 1 refills | Status: DC
  Filled 2023-02-19 (×3): qty 70, 14d supply, fill #0
  Filled 2023-03-04: qty 70, 14d supply, fill #1

## 2023-02-19 NOTE — Telephone Encounter (Signed)
Oral Chemotherapy Pharmacist Encounter  Patient Education I spoke with patient for overview of new oral chemotherapy medication:  futibatinib for the treatment of metastatic cholangiocarcinoma with FGFR2-ADK mutation, planned duration until disease progression or unacceptable toxicity    Counseled patient on administration, dosing, side effects, monitoring, drug-food interactions, safe handling, storage, and disposal. Patient will take 5 tablets (20 mg total) by mouth daily.  Side effects include but not limited to: hyperphosphatemia, diarrhea or constipation, nausea, hand-foot syndrome, nail changes, fatigue, mouth sores.    Reviewed with patient importance of keeping a medication schedule and plan for any missed doses.  After discussion with patient no patient barriers to medication adherence identified.   [name] voiced understanding and appreciation. All questions answered. Medication handout provided.  Provided patient with Oral Chemotherapy Navigation Clinic phone number. Patient knows to call the office with questions or concerns. Oral Chemotherapy Navigation Clinic will continue to follow.  Christopher Marsh, PharmD, BCPS, BCOP, CPP Hematology/Oncology Clinical Pharmacist Practitioner [place]/DB/AP Oral Chemotherapy Navigation Clinic [phone]  02/19/2023 2:08 PM

## 2023-02-19 NOTE — Telephone Encounter (Signed)
Patient successfully OnBoarded and drug education provided by pharmacist. Christopher Marsh scheduled to be shipped on 02/20/23 for delivery on 02/21/23 from [facility] to patient's address. Patient knows to call me at [phone] with any questions or concerns regarding receiving medication or if there are any unexpected co-pay changes.    Ardeen Fillers, CPhT Oncology Pharmacy Patient Advocate  [facility]  [phone] (phone) [phone] (fax) 02/19/2023 2:12 PM

## 2023-02-19 NOTE — Telephone Encounter (Signed)
Oral Oncology Patient Advocate Encounter  Prior Authorization for Orlando Penner has been approved.    PA# [id number]  Effective dates: 02/18/23 through 02/18/24  Patients co-pay is $0.00.  Patient must fill 14 day supply at a time for the first 3 months per insurance. Rob Bunting of 70 tablets for 14 day supply.)    Ardeen Fillers, CPhT Oncology Pharmacy Patient Advocate  [facility]  [phone] (phone) [phone] (fax) 02/19/2023 8:01 AM

## 2023-02-19 NOTE — Telephone Encounter (Signed)
Per Dr. Cathie Hoops, IS has been updated. Pt to get labs tomorrow with MD visit, no infusion.

## 2023-02-20 ENCOUNTER — Inpatient Hospital Stay (HOSPITAL_BASED_OUTPATIENT_CLINIC_OR_DEPARTMENT_OTHER): Payer: BC Managed Care – PPO | Admitting: Oncology

## 2023-02-20 ENCOUNTER — Ambulatory Visit: Payer: BC Managed Care – PPO

## 2023-02-20 ENCOUNTER — Inpatient Hospital Stay: Payer: BC Managed Care – PPO

## 2023-02-20 ENCOUNTER — Other Ambulatory Visit: Payer: Self-pay

## 2023-02-20 ENCOUNTER — Encounter: Payer: Self-pay | Admitting: Cardiovascular Disease

## 2023-02-20 ENCOUNTER — Encounter: Payer: Self-pay | Admitting: Oncology

## 2023-02-20 ENCOUNTER — Other Ambulatory Visit (HOSPITAL_COMMUNITY): Payer: Self-pay

## 2023-02-20 VITALS — BP 126/78 | HR 94 | Temp 98.1°F | Resp 18 | Wt 294.5 lb

## 2023-02-20 DIAGNOSIS — C7951 Secondary malignant neoplasm of bone: Secondary | ICD-10-CM | POA: Diagnosis not present

## 2023-02-20 DIAGNOSIS — D6861 Antiphospholipid syndrome: Secondary | ICD-10-CM

## 2023-02-20 DIAGNOSIS — D6481 Anemia due to antineoplastic chemotherapy: Secondary | ICD-10-CM

## 2023-02-20 DIAGNOSIS — Z86718 Personal history of other venous thrombosis and embolism: Secondary | ICD-10-CM | POA: Diagnosis not present

## 2023-02-20 DIAGNOSIS — C221 Intrahepatic bile duct carcinoma: Secondary | ICD-10-CM | POA: Diagnosis not present

## 2023-02-20 DIAGNOSIS — Z510 Encounter for antineoplastic radiation therapy: Secondary | ICD-10-CM | POA: Diagnosis not present

## 2023-02-20 DIAGNOSIS — I4891 Unspecified atrial fibrillation: Secondary | ICD-10-CM | POA: Insufficient documentation

## 2023-02-20 DIAGNOSIS — Z5111 Encounter for antineoplastic chemotherapy: Secondary | ICD-10-CM | POA: Diagnosis not present

## 2023-02-20 DIAGNOSIS — R978 Other abnormal tumor markers: Secondary | ICD-10-CM | POA: Diagnosis not present

## 2023-02-20 DIAGNOSIS — R600 Localized edema: Secondary | ICD-10-CM | POA: Diagnosis not present

## 2023-02-20 DIAGNOSIS — I4819 Other persistent atrial fibrillation: Secondary | ICD-10-CM

## 2023-02-20 DIAGNOSIS — R918 Other nonspecific abnormal finding of lung field: Secondary | ICD-10-CM | POA: Diagnosis not present

## 2023-02-20 DIAGNOSIS — T451X5A Adverse effect of antineoplastic and immunosuppressive drugs, initial encounter: Secondary | ICD-10-CM

## 2023-02-20 DIAGNOSIS — R0602 Shortness of breath: Secondary | ICD-10-CM | POA: Diagnosis not present

## 2023-02-20 LAB — IRON AND TIBC
Iron: 45 ug/dL (ref 45–182)
Saturation Ratios: 13 % — ABNORMAL LOW (ref 17.9–39.5)
TIBC: 339 ug/dL (ref 250–450)
UIBC: 294 ug/dL

## 2023-02-20 LAB — FERRITIN: Ferritin: 494 ng/mL — ABNORMAL HIGH (ref 24–336)

## 2023-02-20 MED ORDER — HEPARIN SOD (PORK) LOCK FLUSH 100 UNIT/ML IV SOLN
500.0000 [IU] | Freq: Once | INTRAVENOUS | Status: AC
  Administered 2023-02-20: 500 [IU] via INTRAVENOUS
  Filled 2023-02-20: qty 5

## 2023-02-20 MED ORDER — CALCIUM CARBONATE 500 MG PO CHEW: 1000.0000 mg | CHEWABLE_TABLET | Freq: Every day | ORAL | 0 refills | Status: DC

## 2023-02-20 MED ORDER — SODIUM CHLORIDE 0.9% FLUSH
10.0000 mL | Freq: Once | INTRAVENOUS | Status: AC
  Administered 2023-02-20: 10 mL via INTRAVENOUS
  Filled 2023-02-20: qty 10

## 2023-02-20 MED ORDER — SODIUM CHLORIDE 0.9 % IV SOLN
INTRAVENOUS | Status: DC
  Filled 2023-02-20: qty 250

## 2023-02-20 MED ORDER — MAGNESIUM SULFATE 2 GM/50ML IV SOLN
2.0000 g | Freq: Once | INTRAVENOUS | Status: AC
  Administered 2023-02-20: 2 g via INTRAVENOUS
  Filled 2023-02-20: qty 50

## 2023-02-20 MED ORDER — MAGNESIUM CHLORIDE 64 MG PO TBEC: 1.0000 | DELAYED_RELEASE_TABLET | Freq: Two times a day (BID) | ORAL | 0 refills | Status: DC

## 2023-02-20 MED ORDER — ENOXAPARIN SODIUM 150 MG/ML IJ SOSY: 1.0000 mg/kg | PREFILLED_SYRINGE | Freq: Two times a day (BID) | INTRAMUSCULAR | 0 refills | Status: DC

## 2023-02-20 NOTE — Progress Notes (Signed)
Hematology/Oncology Progress note [phone] [phone]    CHIEF COMPLAINTS/REASON FOR VISIT:  Follow up for right lower extremity DVT, antiphospholipid syndrome, metastatic cholangiocarcinoma.  ASSESSMENT & PLAN:   Cancer Staging  Cholangiocarcinoma Staging form: Intrahepatic Bile Duct, AJCC 8th Edition - Clinical stage from 08/27/2022: Stage IV (cT2, cN1, cM1) - Signed by Christopher Patience, MD on 09/13/2022   Cholangiocarcinoma Imaging findings and pathology results were reviewed with patient.  Clinical diagnosis is cholangiocarcinoma, although atypical with both normal CEA and CA 19.9 Interim PET scan after 3 cycles of treatment showed mixed response- decreased hypermetabolism but increased size.   He has had new bone lesions. Refer to radiation oncology for feasibility of treating new bone lesions.  For now I will continue current regimen with short-term repeat imaging. Labs are reviewed and discussed with patient. S/p 6 cycles  Gemcitabine, Cisplatin and Durvalumab, CT showed disease progression - enlarged liver lesions.   NGS-Tempus liquid biopsy FGFR2 -ADK mutation, TMB 0,  Recommend 2nd line Futibatinib 20mg  daily. Rationale and side effects were reviewed with patient. He agrees.  Urgent baseline ophthalmology evaluation, referral was sent.    Antiphospholipid syndrome #Recurrent lower extremity DVT, secondary to antiphospholipid syndrome.-.  Persistent lupus anticoagulant positive. Need  life time anticoagulation Recent US reviewed. Bilateral lower extremity DVT is likely due to non therapeutic dose of Lovenox.[Self decreased] Continue  Lovenox 1 mg/kg twice daily- I have discussed in details with patient regarding dosage adjustment based on his weight which may fluctuate due to being on diuretics.   Lung infiltrate on CT He follows up with pulmonology.  DDX includes organizing pneumonia, ILD, aspiration, lymphatic cancer spread, etc. He declined repeat  bronchoscopy/biopsy.     Hypocalcemia Continue calcium supplementation, increase to 1000mg  daily.  Recommend Vitamin D 1000 units daily.   Metastasis to bone [facility]) Follow up with radiation oncology for palliative radiation to bone lesions.  Consider bisphosphonate after dental clearance is obtained.   Hypomagnesemia Increase slow Mag to twice daily IV Mag 2g x 1 today  Antineoplastic chemotherapy induced anemia Hemoglobin is stable.  Check iron panel.   Atrial fibrillation And cardiomyopathy, follow up with cardiology Rate controlled.    Orders Placed This Encounter  Procedures   Ferritin    Standing Status:   Future    Number of Occurrences:   1    Standing Expiration Date:   02/20/2024   Iron and TIBC    Standing Status:   Future    Number of Occurrences:   1    Standing Expiration Date:   02/20/2024   Ambulatory referral to Ophthalmology    Referral Priority:   Urgent    Referral Type:   Consultation    Referral Reason:   Specialty Services Required    Requested Specialty:   Ophthalmology    Number of Visits Requested:   1   Follow up TBD- plan to see patient 7-10 days after he starts treatment.   All questions were answered. The patient knows to call the clinic with any problems, questions or concerns.  Christopher Patience, MD, PhD [facility] Hematology Oncology 02/20/2023    HISTORY OF PRESENTING ILLNESS:  Patient reports remote history of left lower extremity DVT in 2002.  He was initiated on Lovenox and bridged to Coumadin.  Patient took warfarin for 2 years before anticoagulation was stopped. 12/03/2021, patient presented to emergency room for evaluation of right lower extremity pain and swelling for about a week.  Started on the inner side  of right thigh and migrated to the right calf. + Associated with swelling.  Denies any recent injury, hospitalization, surgery.  He first noticed the symptoms after playing basketball with his grandson.   12/03/2021, right lower  extremity ultrasound showed occlusive DVT extending from the mid aspect of the femoral vein through the imaged tibial vein.  Age-indeterminate. Patient was started on Xarelto. He was referred to establish care with vascular surgeon and was seen by Sheppard Plumber on 12/06/2021.  Shared decision was made not to proceed with embolectomy.  Continue anticoagulation. Patient was referred to hematology oncology for further evaluation.  Patient denies any family history of blood clots.  Denies any unintentional weight loss, fever or night sweats. 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  He sometimes stay in his car while waiting for next assessment appointment.  He reports the right lower extremity symptom has improved since the start of Xarelto.  No active bleeding events.  #12/18/2021 hypercoagulable work-up showed JAK2 V617F mutation negative, with reflex to other mutations CALR, MPL, JAK 2 Ex 12-15 mutations negative, negative anticardiolipin IgG and IgM antibodies, positive lupus anticoagulant, negative factor V Leiden mutation, negative prothrombin gene mutation, normal protein C activity, normal protein S antigen level. Patient was recommended to switch to Coumadin with INR goal of 2-3.  #03/15/2022, repeat lupus anticoagulant is persistently positive- + antiphospholipid syndrome.  Patient is currently on Lovenox 1 mg/kg twice daily.  admitted due to multifocal pneumonia, treated with Influenza panel negative, COVID-negative, HIV negative, Complete course of antibiotics with Zithromax, Vantin   + HCV antibody positive, HCV RNA quantification not detected. HCV RT PCR not detected.   INTERVAL HISTORY Christopher Marsh is a 62 y.o. male who has above history reviewed by me today presents for follow up visit for management of right lower extremity DVT and antiphospholipid syndrome, metastatic  cholangiocarcinoma.  Oncology History  Cholangiocarcinoma  10/30/2021 Imaging   CT chest wo contrast 1. Heterogeneous bilateral ground-glass disease, multilobar but worst in the right middle lobe, lingula and lower lobes. There is progression of the abnormality since 10/12/2022. Though pneumonia could produce this appearance, given persistence and fluctuating appearance since October, consider alternative entities such as hypersensitivity pneumonitis or idiopathic interstitial pneumonia. 2. Slightly enlarged pulmonary trunk, possible arterial hypertension 3. Redemonstrated large hypodense right hepatic lobe mass.   08/18/2022 Imaging   CT chest w contrast showed 1. Multifocal airspace disease in both lungs, left side greater than right. Findings are most compatible with multifocal pneumonia. 2. Multiple new hepatic lesions with enlarging lymph nodes in the upper abdomen and chest. Findings are concerning for metastatic disease. Based on the multifocal pneumonia, hepatic abscesses would also be in the differential diagnosis. Recommend further characterization of the abdomen and pelvis with CT with IV contrast. 3. Enlargement of the main pulmonary artery could be associated with pulmonary hypertension. 4. Coronary artery calcifications. 5. Multinodular goiter. Patient has known thyroid nodules and previous thyroid ultrasound.    08/18/2022 Imaging   CT abdomen pelvis w contrast  Interval development of 6.5 x 5.2 cm heterogeneously enhancing mass in right hepatic lobe. This is highly concerning for neoplasm or malignancy, and further evaluation with MRI is recommended.   Mildly enlarged periaortic adenopathy is noted, with the largest lymph node measuring 12 mm. Metastatic disease cannot be excluded. Mildly enlarged adenopathy is also noted in the gastrohepatic ligament, but this is unchanged compared to prior exam.  Stable bibasilar lung opacities are noted concerning for inflammation,  atelectasis or possibly scarring.   Grossly stable multi-septated cystic lesion is seen involving upper pole of right kidney with peripheral calcifications compared to prior exam of 2019, most likely representing benign etiology.   Aortic Atherosclerosis    08/18/2022 - 08/21/2022 Hospital Admission   Admitted due to multifocal pneumonia, treated with Influenza panel negative, COVID-negative, HIV negative, Complete course of antibiotics with Zithromax, Vantin     08/19/2022 Imaging   MR abdomen w wo contrast  Marked caudate lobe hypertrophy, highly suspicious for hepatic cirrhosis.   Numerous hypervascular masses throughout the right hepatic lobe, highly suspicious for multifocal hepatocellular carcinoma. Recommend correlation with AFP and consider tissue sampling.   Mild abdominal lymphadenopathy, with differential diagnosis including metastatic disease and reactive lymphadenopathy in setting of cirrhosis.   Bilateral lower lobe infiltrates, as better demonstrated on recent CT.    08/19/2022 Tumor Marker   AFP 4.1   08/27/2022 Initial Diagnosis   Cholangiocarcinoma  -08/20/22 Liver mass biopsy showed poorly differentiated carcinoma Immunohistochemical stains show that the poorly differentiated carcinoma is positive for CK7 and MOC-31, suggestive of a poorly differentiated adenocarcinoma.  Immunohistochemical stains for CK20, CDX2, HepPar 1 and arginase are negative.  Immunostain for glypican-3 shows very focal  likely nonspecific labeling.  This immunoprofile is nonspecific but can be compatible with a poorly differentiated primary cholangiocarcinoma in absence of any other lesions.   NGS showed BAP 1 mutation.  Case was presented at tumor board, I have also discussed with pathologist Dr.Rubinas,  IHC pattern is more consistent with adenocarcinoma, unlikely HCC [due to negative HepPar 1 and arginase], cholangiocarcinoma is favored, although this is a diagnosis of exclusion. Second  opinion ar [facility]   08/27/2022 Cancer Staging   Staging form: Intrahepatic Bile Duct, AJCC 8th Edition - Clinical stage from 08/27/2022: Stage IV (cT2, cN1, cM1) - Signed by Christopher Patience, MD on 09/13/2022 Stage prefix: Initial diagnosis   08/27/2022 Tumor Marker   CA 19.9  16 CEA 2.8   09/12/2022 Imaging   PET scan showed 1. Large right hepatic lobe mass is hypermetabolic and consistent with known cholangiocarcinoma. 2. Scattered borderline enlarged upper abdominal lymph nodes do not show any significant FDG uptake. 3. 4 hypermetabolic bone lesions consistent with metastatic disease. 4. Progressive diffuse airspace process in the left lung could be due to atypical pneumonia, pulmonary hemorrhage or left-sided aspiration. 5. Aortic atherosclerosis.  09/12/2022 I had a phone discussion with patient after his PET scan resulted.  Recommend systemic chemotherapy.  Patient would like to defer until his second opinion visit at [facility].   09/15/2022 Imaging   CT angio chest pulmonary embolism protocol showed 1. No evidence for pulmonary embolism. 2. Progression of bilateral multifocal patchy ground-glass and interstitial opacities, left greater than right. Findings are concerning for multifocal pneumonia. 3. Air-fluid levels in the left lower lobe bronchi worrisome for aspiration. 4. Mediastinal and hilar lymphadenopathy has increased in size and number when compared to the prior study, likely reactive.5. Cardiomegaly. 6. Findings compatible with pulmonary artery hypertension.7. Stable right liver mass and complex right renal cystic lesion. 8. Stable enlargement of the left thyroid.   09/15/2022 - 09/17/2022 Hospital Admission   Patient was admitted due to shortness of breath, CT showed progression of bilateral multifocal patchy groundglass and interstitial opacities.  Left greater than right.  Concerning for multifocal pneumonia.  Patient was started on broad-spectrum antibiotics with significant  improvement.  He was discharged on oral antibiotics.  09/19/2022 Miscellaneous   Patient went to [facility] for second opinion.  He was seen by Shirlee Latch, who felt that the presentation is consistent with metastatic cholangiocarcinoma, and agrees with the plan of cisplatin/gemcitabine/durvalumab..    10/11/2022 -  Chemotherapy   Patient is on Treatment Plan : Cisplatin D1,8 + Gemcitabine + Durvalumab  D1,8 q21d x     10/12/2022 Imaging   CT chest w contrast 1. Bilateral airspace and ground-glass opacities have mildly decreased in amount and density when compared to the prior study. Findings are compatible with resolving multifocal pneumonia. Continued follow-up imaging recommended. 2. Stable enlarged heterogeneous left thyroid gland with 3.2 cm nodule. This can be further evaluated with dedicated ultrasound. 3. Stable hepatic and right renal lesions, incompletely evaluated.   Aortic Atherosclerosis (ICD10-I70.0).   12/11/2022 Imaging   PET showed  1. There is been mild increase in size of the tracer avid mass within right lobe of liver compatible with known cholangiocarcinoma. The degree of tracer uptake however is slightly decreased in the interval. 2. Multifocal tracer avid bone metastases are again noted. When compared with the previous exam there has been an mixed interval response to therapy. Specifically, there has been interval increase in size and FDG uptake associated with the right ischial tuberosity lesion and there is a new lesion within the posterior left iliac bone. Decreased tracer uptake is associated with the left scapular lesion and there is stable tracer uptake associated with the right T1 transverse process lesion. 3. Persistent ground-glass and airspace densities within the left upper lobe and left lower lobe compatible with inflammatory or infectious process. Interval resolution of previous right lung ground-glass and airspace densities.    12/24/2022 Procedure   Medi port  placement by Dr. Wyn Quaker   01/22/2023 Imaging   CT chest abdomen pelvis with contrast 1. Hypodense lesion of the right lobe of the liver, hepatic segment VII/VIII is slightly diminished in size, consistent with treatment response. 2. No evident change in additional smaller hypodense lesions scattered throughout the right lobe of the liver, these more difficult to see on prior noncontrast PET-CT. 3. Unchanged enlarged celiac axis, gastrohepatic ligament, and retroperitoneal lymph nodes. Unchanged prominent high right paratracheal lymph nodes. 4. Unchanged osseous metastases. 5. Slightly improved, although still extensive ground-glass airspace opacity primarily seen throughout the left lower lobe and lingula,nonspecific and infectious or inflammatory. 6. Coronary artery disease. 7. Emphysema.   02/11/2023 Imaging   Patient self decreased Lovenox dosage to  once daily.  Increased lower extremity swelling.   02/11/23 [place] bilateral lower extremity showed 1. Nonocclusive DVT within the popliteal veins bilaterally. 2. Occlusive DVT within the posterior tibial veins of the right calf.       02/13/2023 Imaging   CT chest Angiogram  1. Technically adequate exam showing no acute pulmonary embolus. 2. Cardiomegaly. Coronary artery calcifications. 3. Bilateral pleural effusions. 4. Improved aeration of the RIGHT UPPER and LOWER lobes compared to prior study. 5. LEFT LOWER lobe and lingula airspace filling and atelectasis, slightly improved compared to prior study. 6. Cirrhotic morphology of the liver. 7.7 centimeters mass in the RIGHT hepatic lobe. 7. Gastrohepatic ligament adenopathy. 8. A 7 millimeters sclerotic lesion in the UPPER sternum is suspicious for metastatic disease. 9. LEFT thyroid goiter.   02/13/2023 - 02/15/2023 Hospital Admission   A flutter with variable A-V block. Heart rate up to 140.  BNP 119.8 04/18: Echo no overt CHF - EF 50-55  Cardiology recs beta blocker to metoprolol  succinate 50 mg  bid, Torsemid 40mg  daily Adjusting Lovenox 14mg  BID.     INTERVAL HISTORY Christopher Marsh is a 61 y.o. male who has above history reviewed by me today presents for follow up visit for  Cholangiocarcinoma, antiphospholipid syndrome, thrombosis He tolerated chemotherapy.   + lower extremity swelling improved.  He takes Lovenox 140mg  BID currently.  On Torsemide treatments, recommended and managed   Review of Systems  Constitutional:  Positive for fatigue. Negative for appetite change, chills and fever.  HENT:   Negative for hearing loss and voice change.   Eyes:  Negative for eye problems.  Respiratory:  Positive for shortness of breath. Negative for chest tightness and cough.   Cardiovascular:  Positive for leg swelling. Negative for chest pain.  Gastrointestinal:  Negative for abdominal distention, abdominal pain and blood in stool.  Endocrine: Negative for hot flashes.  Genitourinary:  Negative for difficulty urinating and frequency.   Musculoskeletal:  Negative for arthralgias.  Skin:  Negative for itching and rash.  Neurological:  Negative for extremity weakness.  Hematological:  Negative for adenopathy. Bruises/bleeds easily.  Psychiatric/Behavioral:  Negative for confusion.     MEDICAL HISTORY:  Past Medical History:  Diagnosis Date   Antiphospholipid antibody syndrome    Arthritis    Cancer    Diabetes mellitus without complication    DVT (deep venous thrombosis)    Lt leg   Dyspnea    Elevated lipids    Erythrocytosis    Hyperlipidemia    Hypertension    Lower extremity edema    Multinodular thyroid    Post-thrombotic syndrome     SURGICAL HISTORY: Past Surgical History:  Procedure Laterality Date   COLONOSCOPY WITH PROPOFOL N/A 09/23/2018   Per Dr. Norma Fredrickson, polyp, repeat in 3 yrs    EYE MUSCLE SURGERY     JOINT REPLACEMENT Left 07/22/2017   PORTA CATH INSERTION N/A 12/24/2022   Procedure: PORTA CATH INSERTION;  Surgeon: Annice Needy, MD;   Location: [facility] INVASIVE CV LAB;  Service: Cardiovascular;  Laterality: N/A;   TOTAL HIP ARTHROPLASTY Left 07/22/2017   Procedure: TOTAL HIP ARTHROPLASTY;  Surgeon: Donato Heinz, MD;  Location: [facility] ORS;  Service: Orthopedics;  Laterality: Left;    SOCIAL HISTORY: Social History   Socioeconomic History   Marital status: Married    Spouse name: Not on file   Number of children: Not on file   Years of education: Not on file   Highest education level: Not on file  Occupational History   Not on file  Tobacco Use   Smoking status: Former    Packs/day: 0.50    Years: 6.00    Additional pack years: 0.00    Total pack years: 3.00    Types: Cigarettes    Quit date: 68    Years since quitting: 42.3   Smokeless tobacco: Never   Tobacco comments:    Pt states when he did smoke he smoked at most 1 ppd. ALS 09/26/2022  Vaping Use   Vaping Use: Never used  Substance and Sexual Activity   Alcohol use: Yes    Comment: occ   Drug use: No   Sexual activity: Not on file  Other Topics Concern   Not on file  Social History Narrative   Not on file   Social Determinants of Health   Financial Resource Strain: Low Risk  (08/27/2022)   Overall Financial Resource Strain (CARDIA)    Difficulty of Paying Living Expenses: Not very hard  Food Insecurity:  No Food Insecurity (02/18/2023)   Hunger Vital Sign    Worried About Running Out of Food in the Last Year: Never true    Ran Out of Food in the Last Year: Never true  Transportation Needs: No Transportation Needs (02/18/2023)   PRAPARE - Administrator, Civil Service (Medical): No    Lack of Transportation (Non-Medical): No  Physical Activity: Insufficiently Active (08/27/2022)   Exercise Vital Sign    Days of Exercise per Week: 3 days    Minutes of Exercise per Session: 30 min  Stress: Stress Concern Present (08/27/2022)   [employer] of Occupational Health - Occupational Stress Questionnaire    Feeling of Stress :  Rather much  Social Connections: Socially Integrated (08/27/2022)   Social Connection and Isolation Panel [NHANES]    Frequency of Communication with Friends and Family: More than three times a week    Frequency of Social Gatherings with Friends and Family: More than three times a week    Attends Religious Services: 1 to 4 times per year    Active Member of [employer] or Organizations: Yes    Attends Banker Meetings: Never    Marital Status: Married  Catering manager Violence: Not At Risk (02/14/2023)   Humiliation, Afraid, Rape, and Kick questionnaire    Fear of Current or Ex-Partner: No    Emotionally Abused: No    Physically Abused: No    Sexually Abused: No    FAMILY HISTORY: Family History  Problem Relation Age of Onset   Breast cancer Mother    Emphysema Father    Colon cancer Maternal Grandmother     ALLERGIES:  has No Known Allergies.  MEDICATIONS:  Current Outpatient Medications  Medication Sig Dispense Refill   albuterol (VENTOLIN HFA) 108 (90 Base) MCG/ACT inhaler TAKE 2 PUFFS BY MOUTH EVERY 4 HOURS AS NEEDED 8.5 each 1   glipiZIDE (GLUCOTROL) 10 MG tablet Take 1 tablet (10 mg total) by mouth 2 (two) times daily before a meal. (Patient taking differently: Take 10 mg by mouth daily before breakfast.) 180 tablet 3   glucose blood test strip 1 each by Other route as needed for other. accu chek aviva plusUse as instructed     lidocaine-prilocaine (EMLA) cream Apply 1 Application topically as needed. 30 g 5   LORazepam (ATIVAN) 0.5 MG tablet TAKE 1 TABLET (0.5 MG TOTAL) BY MOUTH EVERY 8 (EIGHT) HOURS AS NEEDED FOR ANXIETY (NAUSEA VOMITING). 60 tablet 0   metFORMIN (GLUCOPHAGE) 500 MG tablet TAKE TWO TABLETS BY MOUTH EVERY MORNING WITH BREAKFAST 180 tablet 1   Metoprolol Tartrate 75 MG TABS Take 1 tablet (75 mg total) by mouth 2 (two) times daily. 60 tablet 0   MUCINEX D MAX STRENGTH [phone] MG TB12 Take 1,200 mg by mouth 2 (two) times daily as needed (for  congestion or to loosen mucous in the chest).     ondansetron (ZOFRAN) 8 MG tablet Take 1 tablet (8 mg total) by mouth every 8 (eight) hours as needed for nausea or vomiting. Start on the third day after cisplatin. 90 tablet 1   pravastatin (PRAVACHOL) 20 MG tablet Take 1 tablet (20 mg total) by mouth daily. 90 tablet 3   prochlorperazine (COMPAZINE) 10 MG tablet TAKE 1 TABLET (10 MG TOTAL) BY MOUTH EVERY 6 HOURS AS NEEDED FOR NAUSEA AND VOMITING 90 tablet 1   tadalafil (CIALIS) 20 MG tablet Take 0.5-1 tablets (10-20 mg total) by mouth every other day as needed for  erectile dysfunction. 20 tablet 11   torsemide 40 MG TABS Take 40 mg by mouth daily. Increase to 1 tablet (40 mg total) by mouth TWICE daily (total daily dose 80 mg) as needed for up to 3 days for increased leg swelling, shortness of breath, weight gain 5+ lbs over 1-2 days. Seek medical care if these symptoms are not improving with increased dose. (Patient taking differently: Take 20 mg by mouth daily. Increase to 1 tablet (40 mg total) by mouth TWICE daily (total daily dose 80 mg) as needed for up to 3 days for increased leg swelling, shortness of breath, weight gain 5+ lbs over 1-2 days. Seek medical care if these symptoms are not improving with increased dose.) 45 tablet 0   triamcinolone cream (KENALOG) 0.1 % Apply 1 Application topically 3 (three) times daily. (Patient taking differently: Apply 1 Application topically 3 (three) times daily as needed (for bilateral, below-the-knees skin irritation).) 453.6 g 1   Calcium Carbonate 500 MG CHEW Chew 2 tablets (1,000 mg total) by mouth daily. 60 tablet 0   dexamethasone (DECADRON) 4 MG tablet TAKE 2 TABLETS DAILY X 3 DAYS STARTING THE DAY AFTER CISPLATIN CHEMOTHERAPY. TAKE WITH FOOD. (Patient not taking: Reported on 02/20/2023) 30 tablet 1   enoxaparin (LOVENOX) 150 MG/ML injection Inject 0.9 mLs (135 mg total) into the skin every 12 (twelve) hours for 14 days. 25.2 mL 0   futibatinib, 20 mg  daily dose, (LYTGOBI, 20 MG DAILY DOSE,) 4 MG tablet Take 5 tablets (20 mg total) by mouth daily. (Patient not taking: Reported on 02/20/2023) 70 tablet 1   magnesium chloride (SLOW-MAG) 64 MG TBEC SR tablet Take 1 tablet (64 mg total) by mouth 2 (two) times daily. 60 tablet 0   potassium chloride SA (KLOR-CON M) 20 MEQ tablet Take 2 tablets (40 mEq total) by mouth daily for 3 doses. (Patient not taking: Reported on 02/20/2023) 6 tablet 0   No current facility-administered medications for this visit.   Facility-Administered Medications Ordered in Other Visits  Medication Dose Route Frequency Provider Last Rate Last Admin   0.9 %  sodium chloride infusion   Intravenous Continuous Christopher Patience, MD 10 mL/hr at 02/20/23 0934 New Bag at 02/20/23 0934   [COMPLETED] heparin lock flush 100 unit/mL  500 Units Intravenous Once Christopher Patience, MD   500 Units at 02/20/23 1045     PHYSICAL EXAMINATION: ECOG PERFORMANCE STATUS: 1 - Symptomatic but completely ambulatory Vitals:   02/20/23 0854  BP: 126/78  Pulse: 94  Resp: 18  Temp: 98.1 F (36.7 C)   Filed Weights   02/20/23 0854  Weight: 294 lb 8 oz (133.6 kg)    Physical Exam Constitutional:      General: He is not in acute distress. HENT:     Head: Normocephalic and atraumatic.  Eyes:     General: No scleral icterus. Cardiovascular:     Rate and Rhythm: Normal rate.  Pulmonary:     Effort: Pulmonary effort is normal. No respiratory distress.     Breath sounds: No wheezing.  Abdominal:     General: Bowel sounds are normal. There is no distension.  Musculoskeletal:        General: Normal range of motion.     Cervical back: Normal range of motion and neck supple.     Right lower leg: Edema present.     Left lower leg: Edema present.  Skin:    General: Skin is warm and dry.  Findings: No erythema or rash.  Neurological:     Mental Status: He is alert and oriented to person, place, and time. Mental status is at baseline.     Cranial  Nerves: No cranial nerve deficit.  Psychiatric:        Mood and Affect: Mood normal.     LABORATORY DATA:  I have reviewed the data as listed    Latest Ref Rng & Units 02/19/2023    9:54 AM 02/14/2023    5:40 AM 02/13/2023    8:08 AM  CBC  WBC 4.0 - 10.5 K/uL 2.9  2.6  3.7   Hemoglobin 13.0 - 17.0 g/dL 9.9  8.3  8.5   Hematocrit 39.0 - 52.0 % 32.0  26.8  27.0   Platelets 150 - 400 K/uL 385  212  186       Latest Ref Rng & Units 02/19/2023    9:54 AM 02/15/2023    4:47 AM 02/14/2023    5:40 AM  CMP  Glucose 70 - 99 mg/dL 161  096  045   BUN 8 - 23 mg/dL 9  7  6    Creatinine 0.61 - 1.24 mg/dL 4.09  8.11  9.14   Sodium 135 - 145 mmol/L 135  137  138   Potassium 3.5 - 5.1 mmol/L 3.7  3.4  3.6   Chloride 98 - 111 mmol/L 99  102  104   CO2 22 - 32 mmol/L 23  26  25    Calcium 8.9 - 10.3 mg/dL 8.1  8.1  8.0   Total Protein 6.5 - 8.1 g/dL 6.4     Total Bilirubin 0.3 - 1.2 mg/dL 0.6     Alkaline Phos 38 - 126 U/L 49     AST 15 - 41 U/L 26     ALT 0 - 44 U/L 14       RADIOGRAPHIC STUDIES: I have personally reviewed the radiological images as listed and agreed with the findings in the report. ECHOCARDIOGRAM COMPLETE  Result Date: 02/14/2023    ECHOCARDIOGRAM REPORT   Patient Name:   Christopher Marsh Date of Exam: 02/13/2023 Medical Rec #:  [id number]        Height:       75.0 in Accession #:    [id number]       Weight:       309.1 lb Date of Birth:  May 03, 1961         BSA:          2.640 m Patient Age:    61 years         BP:           151/77 mmHg Patient Gender: M                HR:           95 bpm. Exam Location:  [facility] Procedure: 2D Echo, Cardiac Doppler and Color Doppler Indications:     R06.00 Dyspnea  History:         Patient has prior history of Echocardiogram examinations, most                  recent 08/19/2022. Signs/Symptoms:Dyspnea; Risk                  Factors:Hypertension, Dyslipidemia and Diabetes. Lower                  extremity edema.  Sonographer:     Sedonia Small  Rodgers-Jones  RDCS Referring Phys:  [id number] SHERI HAMMOCK Diagnosing Phys: Julien Nordmann MD IMPRESSIONS  1. Left ventricular ejection fraction, by estimation, is 50 to 55%. The left ventricle has low normal function. The left ventricle has no regional wall motion abnormalities. Left ventricular diastolic parameters are indeterminate.  2. Right ventricular systolic function is normal. The right ventricular size is normal. Tricuspid regurgitation signal is inadequate for assessing PA pressure.  3. The mitral valve is normal in structure. Mild mitral valve regurgitation. No evidence of mitral stenosis.  4. The aortic valve has an indeterminant number of cusps. Aortic valve regurgitation is not visualized. Aortic valve sclerosis is present, with no evidence of aortic valve stenosis.  5. The inferior vena cava is dilated in size with <50% respiratory variability, suggesting right atrial pressure of 15 mmHg. FINDINGS  Left Ventricle: Left ventricular ejection fraction, by estimation, is 50 to 55%. The left ventricle has low normal function. The left ventricle has no regional wall motion abnormalities. The left ventricular internal cavity size was normal in size. There is no left ventricular hypertrophy. Left ventricular diastolic parameters are indeterminate. Right Ventricle: The right ventricular size is normal. No increase in right ventricular wall thickness. Right ventricular systolic function is normal. Tricuspid regurgitation signal is inadequate for assessing PA pressure. Left Atrium: Left atrial size was normal in size. Right Atrium: Right atrial size was normal in size. Pericardium: There is no evidence of pericardial effusion. Mitral Valve: The mitral valve is normal in structure. Mild mitral valve regurgitation. No evidence of mitral valve stenosis. Tricuspid Valve: The tricuspid valve is normal in structure. Tricuspid valve regurgitation is not demonstrated. No evidence of tricuspid stenosis. Aortic Valve: The aortic valve  has an indeterminant number of cusps. Aortic valve regurgitation is not visualized. Aortic valve sclerosis is present, with no evidence of aortic valve stenosis. Pulmonic Valve: The pulmonic valve was normal in structure. Pulmonic valve regurgitation is not visualized. No evidence of pulmonic stenosis. Aorta: The aortic root is normal in size and structure. Venous: The inferior vena cava is dilated in size with less than 50% respiratory variability, suggesting right atrial pressure of 15 mmHg. IAS/Shunts: No atrial level shunt detected by color flow Doppler.  LEFT VENTRICLE PLAX 2D LVIDd:         5.10 cm LVIDs:         3.60 cm LV PW:         0.80 cm LV IVS:        0.80 cm LVOT diam:     2.30 cm LV SV:         98 LV SV Index:   37 LVOT Area:     4.15 cm  RIGHT VENTRICLE             IVC RV Basal diam:  3.90 cm     IVC diam: 2.90 cm RV S prime:     16.80 cm/s TAPSE (M-mode): 2.2 cm LEFT ATRIUM             Index        RIGHT ATRIUM           Index LA diam:        4.80 cm 1.82 cm/m   RA Area:     19.10 cm LA Vol (A2C):   59.9 ml 22.69 ml/m  RA Volume:   52.80 ml  20.00 ml/m LA Vol (A4C):   75.2 ml 28.48 ml/m LA Biplane Vol: 70.3 ml 26.62 ml/m  AORTIC  VALVE LVOT Vmax:   133.00 cm/s LVOT Vmean:  96.950 cm/s LVOT VTI:    0.236 m  AORTA Ao Root diam: 3.90 cm Ao Asc diam:  3.30 cm MV E velocity: 126.25 cm/s                             SHUNTS                             Systemic VTI:  0.24 m                             Systemic Diam: 2.30 cm Julien Nordmann MD Electronically signed by Julien Nordmann MD Signature Date/Time: 02/14/2023/11:51:33 AM    Final    CT Angio Chest PE W and/or Wo Contrast  Result Date: 02/13/2023 CLINICAL DATA:  Pulmonary embolism suspected. High probability DVT. Dyspnea. EXAM: CT ANGIOGRAPHY CHEST WITH CONTRAST TECHNIQUE: Multidetector CT imaging of the chest was performed using the standard protocol during bolus administration of intravenous contrast. Multiplanar CT image reconstructions and  MIPs were obtained to evaluate the vascular anatomy. RADIATION DOSE REDUCTION: This exam was performed according to the departmental dose-optimization program which includes automated exposure control, adjustment of the mA and/or kV according to patient size and/or use of iterative reconstruction technique. CONTRAST:  50mL OMNIPAQUE IOHEXOL 350 MG/ML SOLN COMPARISON:  02/01/2023 FINDINGS: Cardiovascular: Heart is mildly enlarged. No pericardial effusion. There are coronary artery calcifications. The pulmonary arteries are moderately well opacified and there is no acute pulmonary embolus. RIGHT-sided port Christopher extends to the level of the LOWER superior vena cava/UPPER RIGHT atrium. Mediastinum/Nodes: The LEFT lobe of the thyroid gland is enlarged and nodular, consistent with goiter. The appearance is unchanged. Esophagus is unremarkable. There are scattered sub centimeter mediastinal and axillary lymph nodes. Lungs/Pleura: Airways are patent. There are bilateral pleural effusions. LEFT LOWER lobe airspace filling and atelectasis are noted, slightly improved compared to prior study. There has been improvement in RIGHT UPPER and LOWER lobe aeration compared to prior study. Upper Abdomen: There is marked heterogeneous appearance of the liver. The mass previously noted in the RIGHT hepatic lobe is estimated to measure 7.7 centimeters. Cirrhotic morphology of the liver. There is adenopathy in the gastrohepatic ligament, largest node measuring 1 6 centimeters in short axis. Musculoskeletal: There are degenerative changes in the spine. A sclerotic lesion in the UPPER sternum is 7 millimeters, suspicious for metastatic disease. Review of the MIP images confirms the above findings. IMPRESSION: 1. Technically adequate exam showing no acute pulmonary embolus. 2. Cardiomegaly. Coronary artery calcifications. 3. Bilateral pleural effusions. 4. Improved aeration of the RIGHT UPPER and LOWER lobes compared to prior study. 5. LEFT  LOWER lobe and lingula airspace filling and atelectasis, slightly improved compared to prior study. 6. Cirrhotic morphology of the liver. 7.7 centimeters mass in the RIGHT hepatic lobe. 7. Gastrohepatic ligament adenopathy. 8. A 7 millimeters sclerotic lesion in the UPPER sternum is suspicious for metastatic disease. 9. LEFT thyroid goiter. Electronically Signed   By: Norva Pavlov M.D.   On: 02/13/2023 12:09   DG Chest 2 View  Result Date: 02/11/2023 CLINICAL DATA:  SOB EXAM: CHEST - 2 VIEW COMPARISON:  09/15/2022 FINDINGS: Hyperinflated lungs consistent with COPD. Enlarged cardiac silhouette. Bibasilar minimal consolidation volume loss. Bilateral small pleural effusions. Thoracic degenerative changes. Right-sided Port-A-Cath Christopher distal SVC. IMPRESSION: Enlarged cardiac  silhouette. Hyperinflated lungs consistent with COPD. Small pleural effusions and minimal bibasilar consolidation. Electronically Signed   By: Layla Maw M.D.   On: 02/11/2023 15:22   US Venous Img Lower Bilateral  Result Date: 02/11/2023 CLINICAL DATA:  History of DVT, bilateral lower extremity edema EXAM: BILATERAL LOWER EXTREMITY VENOUS DOPPLER ULTRASOUND TECHNIQUE: Gray-scale sonography with compression, as well as color and duplex ultrasound, were performed to evaluate the deep venous system(s) from the level of the common femoral vein through the popliteal and proximal calf veins. COMPARISON:  None Available. FINDINGS: VENOUS Normal compressibility of the common femoral, and superficial femoral veins. There is nonocclusive thrombus identified within the popliteal veins bilaterally. The posterior tibial veins of the right catheter largely occluded. Left posterior tibial veins are patent. Visualized portions of profunda femoral vein and great saphenous vein unremarkable. Doppler waveforms show normal direction of venous flow, normal respiratory plasticity and response to augmentation. OTHER None. Limitations: none IMPRESSION: 1.  Nonocclusive DVT within the popliteal veins bilaterally. 2. Occlusive DVT within the posterior tibial veins of the right calf. 3. These results will be called to the ordering clinician or representative by the Radiologist Assistant, and communication documented in the PACS or [employer]. Electronically Signed   By: Helyn Numbers M.D.   On: 02/11/2023 15:18   CT Angio Chest Pulmonary Embolism (PE) W or WO Contrast  Result Date: 02/01/2023 CLINICAL DATA:  Shortness of breath EXAM: CT ANGIOGRAPHY CHEST WITH CONTRAST TECHNIQUE: Multidetector CT imaging of the chest was performed using the standard protocol during bolus administration of intravenous contrast. Multiplanar CT image reconstructions and MIPs were obtained to evaluate the vascular anatomy. RADIATION DOSE REDUCTION: This exam was performed according to the departmental dose-optimization program which includes automated exposure control, adjustment of the mA and/or kV according to patient size and/or use of iterative reconstruction technique. CONTRAST:  80mL ISOVUE-370 IOPAMIDOL (ISOVUE-370) INJECTION 76% COMPARISON:  01/22/2023 FINDINGS: Cardiovascular: No filling defects in the pulmonary arteries to suggest pulmonary emboli. Heart is normal size. Aorta is normal caliber. Scattered coronary artery and aortic calcifications. Mediastinum/Nodes: No mediastinal, hilar, or axillary adenopathy. Trachea and esophagus are unremarkable. Large left thyroid goiter again noted, unchanged. Lungs/Pleura: Biapical scarring. Ground-glass airspace disease in the lingula and left lower lobe, progressed since prior study. Similar findings also now seen in the right lung base. Findings compatible with worsening pneumonia. No visible effusions or pneumothorax. Upper Abdomen: Large right hepatic lobe mass again seen as on prior imaging compatible with patient's known cholangiocarcinoma. Musculoskeletal: No acute findings Review of the MIP images confirms the above  findings. IMPRESSION: Airspace disease within the lingula and left lower lobe as well as right lung base compatible with pneumonia, worsening since prior study. No evidence of pulmonary embolus. Right hepatic lobe mass again noted as seen on recent imaging and cancer staging. Coronary artery disease. Aortic Atherosclerosis (ICD10-I70.0). Electronically Signed   By: Charlett Nose M.D.   On: 02/01/2023 15:46   CT CHEST ABDOMEN PELVIS W CONTRAST  Result Date: 01/23/2023 CLINICAL DATA:  Cholangiocarcinoma * Tracking Code: BO * EXAM: CT CHEST, ABDOMEN, AND PELVIS WITH CONTRAST TECHNIQUE: Multidetector CT imaging of the chest, abdomen and pelvis was performed following the standard protocol during bolus administration of intravenous contrast. RADIATION DOSE REDUCTION: This exam was performed according to the departmental dose-optimization program which includes automated exposure control, adjustment of the mA and/or kV according to patient size and/or use of iterative reconstruction technique. CONTRAST:  OMNIPAQUE IOHEXOL 300 MG/ML  SOLN COMPARISON:  PET-CT, 12/10/2022 FINDINGS: CT CHEST FINDINGS Cardiovascular: Right chest port catheter. Normal heart size. Scattered left and right coronary artery calcifications. No pericardial effusion. Mediastinum/Nodes: Unchanged prominent high right paratracheal lymph nodes (series 2, image 97). Unchanged heterogeneous left lobe thyroid goiter, no further follow-up or characterization is required in the setting of known metastatic primary malignancy (series 2, image 7). In the setting of significant comorbidities or limited life expectancy, no follow-up recommended (ref: J Am Coll Radiol. 2015 Feb;12(2): 143-50). Trachea, and esophagus demonstrate no significant findings. Lungs/Pleura: Slightly improved, although still extensive ground-glass airspace opacity primarily seen throughout the left lower lobe and lingula (series 3, image 115). Minimal paraseptal emphysema. No  pleural effusion or pneumothorax. Musculoskeletal: No chest wall abnormality. No acute osseous findings. CT ABDOMEN PELVIS FINDINGS Hepatobiliary: Hypodense lesion of the right lobe of the liver, hepatic segment VII/VIII is slightly diminished in size measuring 6.8 x 6.3 cm, previously 8.3 x 6.1 cm when measured similarly (series 2, image 67). No evident change in additional smaller hypodense lesions scattered throughout the right lobe of the liver, these more difficult to see on prior noncontrast PET-CT, for example in the superior liver dome, hepatic segment VIII measuring 1.6 x 1.5 cm (series 2, image 62). No gallstones, gallbladder wall thickening, or biliary dilatation. Pancreas: Unremarkable. No pancreatic ductal dilatation or surrounding inflammatory changes. Spleen: Normal in size without significant abnormality. Adrenals/Urinary Tract: Adrenal glands are unremarkable. Unchanged benign, thinly septated and rim calcified cyst of the superior pole of the right kidney, as well as additional simple bilateral renal cortical cysts, for which no further follow-up or characterization is required. Kidneys are otherwise normal, without renal calculi, solid lesion, or hydronephrosis. Bladder is unremarkable. Stomach/Bowel: Stomach is within normal limits. Appendix appears normal. No evidence of bowel wall thickening, distention, or inflammatory changes. Vascular/Lymphatic: Aortic atherosclerosis. Similar enlarged celiac axis, gastrohepatic ligament, and retroperitoneal lymph nodes, largest gastrohepatic ligament node measuring 2.6 x 1.5 cm (series 2, image 67). Reproductive: Prostatomegaly. Other: No abdominal wall hernia or abnormality. No ascites. Musculoskeletal: No acute osseous findings. Status post left hip total arthroplasty. Similar appearance of a lytic metastasis of the right ischial tuberosity (series 2, image 141). Similar sclerotic metastasis of the posterior left ilium (series 2, image 122). Similar mixed  lytic and sclerotic metastasis of the left scapular body (series 2, image 9). Similar lytic metastasis of the right T1 transverse process (series 2, image 4). IMPRESSION: 1. Hypodense lesion of the right lobe of the liver, hepatic segment VII/VIII is slightly diminished in size, consistent with treatment response. 2. No evident change in additional smaller hypodense lesions scattered throughout the right lobe of the liver, these more difficult to see on prior noncontrast PET-CT. 3. Unchanged enlarged celiac axis, gastrohepatic ligament, and retroperitoneal lymph nodes. Unchanged prominent high right paratracheal lymph nodes. 4. Unchanged osseous metastases. 5. Slightly improved, although still extensive ground-glass airspace opacity primarily seen throughout the left lower lobe and lingula, nonspecific and infectious or inflammatory. 6. Coronary artery disease. 7. Emphysema. Aortic Atherosclerosis (ICD10-I70.0) and Emphysema (ICD10-J43.9). Electronically Signed   By: Jearld Lesch M.D.   On: 01/23/2023 08:56

## 2023-02-20 NOTE — Assessment & Plan Note (Signed)
Follow up with radiation oncology for palliative radiation to bone lesions.  Consider bisphosphonate after dental clearance is obtained. 

## 2023-02-20 NOTE — Assessment & Plan Note (Signed)
And cardiomyopathy, follow up with cardiology Rate controlled.

## 2023-02-20 NOTE — Assessment & Plan Note (Signed)
Continue calcium supplementation, increase to  daily.  Recommend Vitamin D 1000 units daily.

## 2023-02-20 NOTE — Assessment & Plan Note (Signed)
He follows up with pulmonology.  DDX includes organizing pneumonia, ILD, aspiration, lymphatic cancer spread, etc. He declined repeat bronchoscopy/biopsy.

## 2023-02-20 NOTE — Assessment & Plan Note (Addendum)
Imaging findings and pathology results were reviewed with patient.  Clinical diagnosis is cholangiocarcinoma, although atypical with both normal CEA and CA 19.9 Interim PET scan after 3 cycles of treatment showed mixed response- decreased hypermetabolism but increased size.   He has had new bone lesions. Refer to radiation oncology for feasibility of treating new bone lesions.  For now I will continue current regimen with short-term repeat imaging. Labs are reviewed and discussed with patient. S/p 6 cycles  Gemcitabine, Cisplatin and Durvalumab, CT showed disease progression - enlarged liver lesions.   NGS-Tempus liquid biopsy FGFR2 -ADK mutation, TMB 0,  Recommend 2nd line Futibatinib  daily. Rationale and side effects were reviewed with patient. He agrees.  Urgent baseline ophthalmology evaluation, referral was sent.

## 2023-02-20 NOTE — Assessment & Plan Note (Signed)
Hemoglobin is stable.  Check iron panel.

## 2023-02-20 NOTE — Progress Notes (Signed)
Pt here for follow up. Pt has questions about Torsemide, she

## 2023-02-20 NOTE — Assessment & Plan Note (Addendum)
Increase slow Mag to twice daily IV Mag 2g x 1 today

## 2023-02-20 NOTE — Assessment & Plan Note (Signed)
#  Recurrent lower extremity DVT, secondary to antiphospholipid syndrome.-.  Persistent lupus anticoagulant positive. Need  life time anticoagulation Recent US reviewed. Bilateral lower extremity DVT is likely due to non therapeutic dose of Lovenox.[Self decreased] Continue  Lovenox 1 mg/kg twice daily- I have discussed in details with patient regarding dosage adjustment based on his weight which may fluctuate due to being on diuretics.

## 2023-02-21 ENCOUNTER — Other Ambulatory Visit: Payer: BC Managed Care – PPO

## 2023-02-21 ENCOUNTER — Ambulatory Visit: Payer: BC Managed Care – PPO

## 2023-02-21 ENCOUNTER — Ambulatory Visit
Admission: RE | Admit: 2023-02-21 | Discharge: 2023-02-21 | Disposition: A | Discharge status: Home / Self Care | Payer: BC Managed Care – PPO | Source: Ambulatory Visit | Attending: Radiation Oncology | Admitting: Radiation Oncology

## 2023-02-21 ENCOUNTER — Other Ambulatory Visit: Payer: Self-pay

## 2023-02-21 DIAGNOSIS — C7951 Secondary malignant neoplasm of bone: Secondary | ICD-10-CM | POA: Diagnosis not present

## 2023-02-21 DIAGNOSIS — Z86718 Personal history of other venous thrombosis and embolism: Secondary | ICD-10-CM | POA: Diagnosis not present

## 2023-02-21 DIAGNOSIS — D6861 Antiphospholipid syndrome: Secondary | ICD-10-CM | POA: Diagnosis not present

## 2023-02-21 DIAGNOSIS — R600 Localized edema: Secondary | ICD-10-CM | POA: Diagnosis not present

## 2023-02-21 DIAGNOSIS — R0602 Shortness of breath: Secondary | ICD-10-CM | POA: Diagnosis not present

## 2023-02-21 DIAGNOSIS — Z510 Encounter for antineoplastic radiation therapy: Secondary | ICD-10-CM | POA: Diagnosis not present

## 2023-02-21 DIAGNOSIS — R978 Other abnormal tumor markers: Secondary | ICD-10-CM | POA: Diagnosis not present

## 2023-02-21 DIAGNOSIS — Z5111 Encounter for antineoplastic chemotherapy: Secondary | ICD-10-CM | POA: Diagnosis not present

## 2023-02-21 DIAGNOSIS — D6481 Anemia due to antineoplastic chemotherapy: Secondary | ICD-10-CM | POA: Diagnosis not present

## 2023-02-21 DIAGNOSIS — C221 Intrahepatic bile duct carcinoma: Secondary | ICD-10-CM | POA: Diagnosis not present

## 2023-02-21 LAB — RAD ONC ARIA SESSION SUMMARY
Course Elapsed Days: 45
Plan Fractions Treated to Date: 5
Plan Prescribed Dose Per Fraction: 6 Gy
Plan Total Fractions Prescribed: 5
Plan Total Prescribed Dose: 30 Gy
Reference Point Dosage Given to Date: 30 Gy
Reference Point Session Dosage Given: 6 Gy
Session Number: 10

## 2023-02-22 ENCOUNTER — Telehealth: Payer: Self-pay

## 2023-02-22 NOTE — Telephone Encounter (Signed)
Called and spoke to [place] at [facility] center inquiring about urgent referral. She states that mssg was left for pt to return call but they have not heard back. Will send pt Mychart message.

## 2023-02-26 ENCOUNTER — Other Ambulatory Visit (HOSPITAL_COMMUNITY): Payer: Self-pay

## 2023-02-26 ENCOUNTER — Other Ambulatory Visit: Payer: Self-pay

## 2023-02-26 MED ORDER — ENOXAPARIN SODIUM 150 MG/ML IJ SOSY: 1.0000 mg/kg | PREFILLED_SYRINGE | Freq: Two times a day (BID) | INTRAMUSCULAR | 0 refills | Status: DC

## 2023-02-27 ENCOUNTER — Ambulatory Visit: Payer: BC Managed Care – PPO

## 2023-02-27 DIAGNOSIS — Z79899 Other long term (current) drug therapy: Secondary | ICD-10-CM | POA: Diagnosis not present

## 2023-02-27 DIAGNOSIS — H04123 Dry eye syndrome of bilateral lacrimal glands: Secondary | ICD-10-CM | POA: Diagnosis not present

## 2023-02-27 DIAGNOSIS — Z0100 Encounter for examination of eyes and vision without abnormal findings: Secondary | ICD-10-CM | POA: Diagnosis not present

## 2023-02-27 DIAGNOSIS — H2513 Age-related nuclear cataract, bilateral: Secondary | ICD-10-CM | POA: Diagnosis not present

## 2023-02-27 LAB — HM DIABETES EYE EXAM

## 2023-02-28 ENCOUNTER — Other Ambulatory Visit (HOSPITAL_COMMUNITY): Payer: Self-pay

## 2023-02-28 ENCOUNTER — Telehealth: Payer: Self-pay

## 2023-02-28 ENCOUNTER — Other Ambulatory Visit: Payer: Self-pay

## 2023-02-28 ENCOUNTER — Ambulatory Visit: Payer: BC Managed Care – PPO

## 2023-02-28 DIAGNOSIS — C221 Intrahepatic bile duct carcinoma: Secondary | ICD-10-CM

## 2023-02-28 NOTE — Telephone Encounter (Signed)
Dr. Cathie Hoops has reviewed [facility] notes and is ok with pt to proceed with LYTGOBI.   Pt informed and and states that he would like to start on Sunday 5/5 due to personal reasons. MD aware and is ok with this.   Please schedule lab/MD approx 7-10 days from 5/5. Please inform pt of appt, he is aware of follow up plan.

## 2023-02-28 NOTE — Telephone Encounter (Signed)
-----   Message from [facility] sent at 02/28/2023 11:18 AM EDT ----- Regarding: [facility]

## 2023-03-04 ENCOUNTER — Other Ambulatory Visit (HOSPITAL_COMMUNITY): Payer: Self-pay

## 2023-03-06 ENCOUNTER — Other Ambulatory Visit: Payer: Self-pay

## 2023-03-07 ENCOUNTER — Encounter: Payer: Self-pay | Admitting: Oncology

## 2023-03-07 ENCOUNTER — Ambulatory Visit
Admission: RE | Admit: 2023-03-07 | Discharge: 2023-03-07 | Disposition: A | Discharge status: Home / Self Care | Payer: BC Managed Care – PPO | Source: Ambulatory Visit | Attending: Radiation Oncology | Admitting: Radiation Oncology

## 2023-03-07 ENCOUNTER — Other Ambulatory Visit: Payer: Self-pay | Admitting: Oncology

## 2023-03-07 ENCOUNTER — Encounter: Payer: Self-pay | Admitting: Radiation Oncology

## 2023-03-07 VITALS — BP 128/58 | HR 93 | Temp 96.6°F | Resp 19 | Wt 287.3 lb

## 2023-03-07 DIAGNOSIS — C7951 Secondary malignant neoplasm of bone: Secondary | ICD-10-CM

## 2023-03-07 MED ORDER — LACTULOSE 20 GM/30ML PO SOLN: 20.0000 g | Freq: Every day | ORAL | 0 refills | Status: DC | PRN

## 2023-03-07 NOTE — Progress Notes (Signed)
Radiation Oncology Follow up Note  Name: Christopher Marsh   Date:   03/07/2023 MRN:  [id number] DOB: Dec 25, 1960    This 62 y.o. male presents to the clinic today for 1 month follow-up status post SBRT treatment to the area of T1 transverse process involvement as well as right ischial tuberosity in patient with known stage IV cholangiocarcinoma.  REFERRING PROVIDER: Nelwyn Salisbury, MD  HPI: Patient is a 62 year old male now out 1 month having completed SBRT palliative treatment to area of T1 transverse process involvement as well as right ischial tuberosity with concern for ventral pathologic fracture and patient with known stage IV cholangiocarcinoma.  Seen today in follow-up he is doing fairly well.  He states the pain has improved.  He is ready to try some golf next week.  He has been recommended for.2nd line Futibatinib   COMPLICATIONS OF TREATMENT: none  FOLLOW UP COMPLIANCE: keeps appointments   PHYSICAL EXAM:  BP (!) 128/58   Pulse 93   Temp (!) 96.6 F (35.9 C)   Resp 19   Wt 287 lb 4.8 oz (130.3 kg)   SpO2 97%   BMI 35.91 kg/m  The palpation of the spine does not elicit pain.  Range of motion is lower extremities does not elicit pain.  Well-developed well-nourished patient in NAD. HEENT reveals PERLA, EOMI, discs not visualized.  Oral cavity is clear. No oral mucosal lesions are identified. Neck is clear without evidence of cervical or supraclavicular adenopathy. Lungs are clear to A&P. Cardiac examination is essentially unremarkable with regular rate and rhythm without murmur rub or thrill. Abdomen is benign with no organomegaly or masses noted. Motor sensory and DTR levels are equal and symmetric in the upper and lower extremities. Cranial nerves II through XII are grossly intact. Proprioception is intact. No peripheral adenopathy or edema is identified. No motor or sensory levels are noted. Crude visual fields are within normal range.  RADIOLOGY RESULTS: No current films for  review  PLAN: 1 time he is under good pain control at this time.  I am going to turn follow-up care over to medical oncology.  I be happy to reevaluate the patient at any time should that be indicated.  Patient is to call with any concerns.  I would like to take this opportunity to thank you for allowing me to participate in the care of your patient.Carmina Miller, MD

## 2023-03-11 NOTE — Progress Notes (Unsigned)
Cardiology Office Note  Date:  03/13/2023   ID:  OSEI Christopher Marsh, DOB Nov 27, 1960, MRN [id number]  PCP:  Nelwyn Salisbury, MD   Chief Complaint  Patient presents with   [name] follow up     Patient c/o shortness of breath & dizziness for the past two days. Medications reviewed by the patient verbally.     HPI:  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 Recurrent DVT and antiphospholipid syndrome on Lovenox Lymphedema, uses lymphedema compression pumps Who presents following recent hospitalization for atrial flutter  Getting over from pneumonia over the past several months In the hospital April 2024 after presenting to oncology with heart rate 140 bpm Diagnosed with DVTs, atrial flutter, leg swelling, anemia As outpatient had no improvement in leg swelling and shortness of breath on Lasix  In the ER noted to be in atrial fibrillation with RVR Started on metoprolol 12.5 every 6 hours Placed on rate control, anticoagulation/Lovenox  Since d/c, has had anorexia Got constipated Started laxatives, now with diarrhea, 3 days Orthostatic symptoms today, blood pressure low  EKG personally reviewed by myself on todays visit Atrial flutter ventricular rate 85 bpm bpm nonspecific ST abnormality   PMH:   has a past medical history of Antiphospholipid antibody syndrome (HCC), Arthritis, Cancer (HCC), Diabetes mellitus without complication (HCC), DVT (deep venous thrombosis) (HCC), Dyspnea, Elevated lipids, Erythrocytosis, Hyperlipidemia, Hypertension, Lower extremity edema, Multinodular thyroid, and Post-thrombotic syndrome.  PSH:    Past Surgical History:  Procedure Laterality Date   COLONOSCOPY WITH PROPOFOL N/A 09/23/2018   Per Dr. Norma Fredrickson, polyp, repeat in 3 yrs    EYE MUSCLE SURGERY     JOINT REPLACEMENT Left 07/22/2017   PORTA CATH INSERTION N/A 12/24/2022   Procedure: PORTA CATH INSERTION;  Surgeon: Annice Needy, MD;  Location: [facility] INVASIVE CV LAB;  Service: Cardiovascular;  Laterality: N/A;   TOTAL HIP ARTHROPLASTY Left 07/22/2017   Procedure: TOTAL HIP ARTHROPLASTY;  Surgeon: Donato Heinz, MD;  Location: [facility] ORS;  Service: Orthopedics;  Laterality: Left;    Current Outpatient Medications  Medication Sig Dispense Refill   albuterol (VENTOLIN HFA) 108 (90 Base) MCG/ACT inhaler TAKE 2 PUFFS BY MOUTH EVERY 4 HOURS AS NEEDED 8.5 each 1   Calcium Carbonate 500 MG CHEW Chew 2 tablets (1,000 mg total) by mouth daily. 60 tablet 0   enoxaparin (LOVENOX) 150 MG/ML injection Inject 0.86 mLs (129 mg total) into the skin every 12 (twelve) hours. 60 mL 0   futibatinib, 16 mg daily dose, (LYTGOBI) 4 MG tablet Take 4 tablets (16 mg total) by mouth daily. 112 tablet 0   glipiZIDE (GLUCOTROL) 10 MG tablet Take 1 tablet (10 mg total) by mouth 2 (two) times daily before a meal. 180 tablet 3   glucose blood test strip 1 each by Other route as needed for other. accu chek aviva plusUse as instructed     Lactulose 20 GM/30ML SOLN Take 30 mLs (20 g total) by mouth daily as needed (constipation.). 450 mL 0   lidocaine-prilocaine (EMLA) cream Apply 1 Application topically as needed. 30 g 5   LORazepam (ATIVAN) 0.5 MG tablet TAKE 1 TABLET (0.5 MG TOTAL) BY MOUTH EVERY 8 (EIGHT) HOURS AS NEEDED FOR ANXIETY (NAUSEA VOMITING). 60 tablet 0   magnesium chloride (SLOW-MAG) 64 MG TBEC SR tablet Take 1 tablet (64 mg total) by mouth 2 (two) times daily. 60 tablet 0   metFORMIN (GLUCOPHAGE) 500 MG  tablet TAKE TWO TABLETS BY MOUTH EVERY MORNING WITH BREAKFAST 180 tablet 1   Metoprolol Tartrate 75 MG TABS Take 1 tablet (75 mg total) by mouth 2 (two) times daily. 60 tablet 0   MUCINEX D MAX STRENGTH [id number] MG TB12 Take 1,200 mg by mouth 2 (two) times daily as needed (for congestion or to loosen mucous in the chest).     ondansetron (ZOFRAN) 8 MG tablet Take 1 tablet (8 mg total) by mouth every 8 (eight) hours as needed for nausea or  vomiting. Start on the third day after cisplatin. 90 tablet 1   pravastatin (PRAVACHOL) 20 MG tablet Take 1 tablet (20 mg total) by mouth daily. 90 tablet 3   prochlorperazine (COMPAZINE) 10 MG tablet TAKE 1 TABLET (10 MG TOTAL) BY MOUTH EVERY 6 HOURS AS NEEDED FOR NAUSEA AND VOMITING 90 tablet 1   tadalafil (CIALIS) 20 MG tablet Take 0.5-1 tablets (10-20 mg total) by mouth every other day as needed for erectile dysfunction. 20 tablet 11   torsemide 40 MG TABS Take 40 mg by mouth daily. Increase to 1 tablet (40 mg total) by mouth TWICE daily (total daily dose 80 mg) as needed for up to 3 days for increased leg swelling, shortness of breath, weight gain 5+ lbs over 1-2 days. Seek medical care if these symptoms are not improving with increased dose. (Patient taking differently: Take 20 mg by mouth daily. Increase to 1 tablet (40 mg total) by mouth TWICE daily (total daily dose 80 mg) as needed for up to 3 days for increased leg swelling, shortness of breath, weight gain 5+ lbs over 1-2 days. Seek medical care if these symptoms are not improving with increased dose.) 45 tablet 0   triamcinolone cream (KENALOG) 0.1 % Apply 1 Application topically 3 (three) times daily. (Patient taking differently: Apply 1 Application topically 3 (three) times daily as needed (for bilateral, below-the-knees skin irritation).) 453.6 g 1   dexamethasone (DECADRON) 4 MG tablet TAKE 2 TABLETS DAILY X 3 DAYS STARTING THE DAY AFTER CISPLATIN CHEMOTHERAPY. TAKE WITH FOOD. (Patient not taking: Reported on 02/20/2023) 30 tablet 1   No current facility-administered medications for this visit.    Allergies:   Patient has no known allergies.   Social History:  The patient  reports that he quit smoking about 42 years ago. His smoking use included cigarettes. He has a 3.00 pack-year smoking history. He has never used smokeless tobacco. He reports that he does not currently use alcohol. He reports that he does not use drugs.   Family  History:   family history includes Breast cancer in his mother; Colon cancer in his maternal grandmother; Emphysema in his father.    Review of Systems: Review of Systems  Constitutional: Negative.   HENT: Negative.    Respiratory: Negative.    Cardiovascular: Negative.   Gastrointestinal: Negative.   Musculoskeletal: Negative.   Neurological:  Positive for dizziness.  Psychiatric/Behavioral: Negative.    All other systems reviewed and are negative.   PHYSICAL EXAM: VS:  BP (!) 88/70 (BP Location: Left Arm, Patient Position: Sitting, Cuff Size: Normal)   Pulse 85   Ht 6\' 3"  (1.905 m)   Wt 286 lb 2 oz (129.8 kg)   SpO2 95%   BMI 35.76 kg/m  , BMI Body mass index is 35.76 kg/m. GEN: Well nourished, well developed, in no acute distress HEENT: normal Neck: no JVD, carotid bruits, or masses Cardiac: Irregularly irregular; no murmurs, rubs, or gallops,no edema  Respiratory:  clear to auscultation bilaterally, normal work of breathing GI: soft, nontender, nondistended, + BS MS: no deformity or atrophy Skin: warm and dry, no rash Neuro:  Strength and sensation are intact Psych: euthymic mood, full affect  Recent Labs: 02/13/2023: B Natriuretic Peptide 119.8; TSH 0.269 03/12/2023: ALT 12; BUN 9; Creatinine 1.05; Hemoglobin 11.6; Magnesium 1.7; Platelet Count 170; Potassium 3.5; Sodium 136    Lipid Panel Lab Results  Component Value Date   CHOL 172 08/08/2022   HDL 74.30 08/08/2022   LDLCALC 84 08/08/2022   TRIG 66.0 08/08/2022      Wt Readings from Last 3 Encounters:  03/13/23 286 lb 2 oz (129.8 kg)  03/12/23 286 lb (129.7 kg)  03/07/23 287 lb 4.8 oz (130.3 kg)    ASSESSMENT AND PLAN:  Problem List Items Addressed This Visit       Cardiology Problems   Atrial fibrillation (HCC) (Chronic)   Acute diastolic CHF (congestive heart failure) (HCC)   Hyperlipemia   Essential hypertension   Atrial flutter with rapid ventricular response (HCC)     Other    Cholangiocarcinoma (HCC) - Primary (Chronic)   Other Visit Diagnoses     Anticoagulant long-term use       DVT of deep femoral vein, right (HCC)          Atrial flutter On Lovenox Rate relatively well-controlled on metoprolol Plan for cardioversion next week   Lymphedema/leg swelling Has history of lymphedema and bilateral nonocclusive popliteal DVTs, with occlusive DVT popliteal and tibial vein on right Long history of lower extremity edema, Continue compression hose  hold off on lymphedema compression pumps in the setting of DVTs    Abnormal TSH with multinodular thyroid Low TSH, normal T4   Anemia Hemoglobin trending back upwards now 11.6  on Lovenox SQ twice daily Followed by hematology oncology   Diastolic CHF EF 50 to 55%, dilated IVC Likely exacerbated by anemia, atrial flutter with rapid rate Rate control with metoprolol tartrate as above, In the setting of diarrhea and hypotension/orthostasis today, recommend he hold torsemide for at least 3 days until diarrhea resolves and he is hydrated   Orthostatic Blood pressure down to 88/52 with standing, feeling dizzy In the setting of torsemide, diarrhea the past 4 days Plan as above, will hold torsemide 3 days, hydrate, once diarrhea resolves could restart torsemide 20 daily   Total encounter time more than 40 minutes  Greater than 50% was spent in counseling and coordination of care with the patient    Signed, Dossie Arbour, M.D., Ph.D. [facility] [place], [place] [phone]

## 2023-03-11 NOTE — H&P (View-Only) (Signed)
Cardiology Office Note  Date:  03/13/2023   ID:  Christopher Marsh, DOB 07/19/1961, MRN [id number]  PCP:  Fry, Stephen A, MD   Chief Complaint  Patient presents with   [facility] follow up     Patient c/o shortness of breath & dizziness for the past two days. Medications reviewed by the patient verbally.     HPI:  Christopher Marsh is a 62-year-old gentleman with past medical history of Cholangiocarcinoma with metastasis to bone Recurrent DVT and antiphospholipid syndrome on Lovenox Lymphedema, uses lymphedema compression pumps Who presents following recent hospitalization for atrial flutter  Getting over from pneumonia over the past several months In the hospital April 2024 after presenting to oncology with heart rate 140 bpm Diagnosed with DVTs, atrial flutter, leg swelling, anemia As outpatient had no improvement in leg swelling and shortness of breath on Lasix  In the ER noted to be in atrial fibrillation with RVR Started on metoprolol 12.5 every 6 hours Placed on rate control, anticoagulation/Lovenox  Since d/c, has had anorexia Got constipated Started laxatives, now with diarrhea, 3 days Orthostatic symptoms today, blood pressure low  EKG personally reviewed by myself on todays visit Atrial flutter ventricular rate 85 bpm bpm nonspecific ST abnormality   PMH:   has a past medical history of Antiphospholipid antibody syndrome (HCC), Arthritis, Cancer (HCC), Diabetes mellitus without complication (HCC), DVT (deep venous thrombosis) (HCC), Dyspnea, Elevated lipids, Erythrocytosis, Hyperlipidemia, Hypertension, Lower extremity edema, Multinodular thyroid, and Post-thrombotic syndrome.  PSH:    Past Surgical History:  Procedure Laterality Date   COLONOSCOPY WITH PROPOFOL N/A 09/23/2018   Per Dr. Toledo, polyp, repeat in 3 yrs    EYE MUSCLE SURGERY     JOINT REPLACEMENT Left 07/22/2017   PORTA CATH INSERTION N/A 12/24/2022   Procedure: PORTA CATH INSERTION;  Surgeon: Dew, Jason  S, MD;  Location: [facility] INVASIVE CV LAB;  Service: Cardiovascular;  Laterality: N/A;   TOTAL HIP ARTHROPLASTY Left 07/22/2017   Procedure: TOTAL HIP ARTHROPLASTY;  Surgeon: Hooten, James P, MD;  Location: [facility] ORS;  Service: Orthopedics;  Laterality: Left;    Current Outpatient Medications  Medication Sig Dispense Refill   albuterol (VENTOLIN HFA) 108 (90 Base) MCG/ACT inhaler TAKE 2 PUFFS BY MOUTH EVERY 4 HOURS AS NEEDED 8.5 each 1   Calcium Carbonate 500 MG CHEW Chew 2 tablets (1,000 mg total) by mouth daily. 60 tablet 0   enoxaparin (LOVENOX) 150 MG/ML injection Inject 0.86 mLs (129 mg total) into the skin every 12 (twelve) hours. 60 mL 0   futibatinib, 16 mg daily dose, (LYTGOBI) 4 MG tablet Take 4 tablets (16 mg total) by mouth daily. 112 tablet 0   glipiZIDE (GLUCOTROL) 10 MG tablet Take 1 tablet (10 mg total) by mouth 2 (two) times daily before a meal. 180 tablet 3   glucose blood test strip 1 each by Other route as needed for other. accu chek aviva plusUse as instructed     Lactulose 20 GM/30ML SOLN Take 30 mLs (20 g total) by mouth daily as needed (constipation.). 450 mL 0   lidocaine-prilocaine (EMLA) cream Apply 1 Application topically as needed. 30 g 5   LORazepam (ATIVAN) 0.5 MG tablet TAKE 1 TABLET (0.5 MG TOTAL) BY MOUTH EVERY 8 (EIGHT) HOURS AS NEEDED FOR ANXIETY (NAUSEA VOMITING). 60 tablet 0   magnesium chloride (SLOW-MAG) 64 MG TBEC SR tablet Take 1 tablet (64 mg total) by mouth 2 (two) times daily. 60 tablet 0   metFORMIN (GLUCOPHAGE) 500 MG   tablet TAKE TWO TABLETS BY MOUTH EVERY MORNING WITH BREAKFAST 180 tablet 1   Metoprolol Tartrate 75 MG TABS Take 1 tablet (75 mg total) by mouth 2 (two) times daily. 60 tablet 0   MUCINEX D MAX STRENGTH 120-1200 MG TB12 Take 1,200 mg by mouth 2 (two) times daily as needed (for congestion or to loosen mucous in the chest).     ondansetron (ZOFRAN) 8 MG tablet Take 1 tablet (8 mg total) by mouth every 8 (eight) hours as needed for nausea or  vomiting. Start on the third day after cisplatin. 90 tablet 1   pravastatin (PRAVACHOL) 20 MG tablet Take 1 tablet (20 mg total) by mouth daily. 90 tablet 3   prochlorperazine (COMPAZINE) 10 MG tablet TAKE 1 TABLET (10 MG TOTAL) BY MOUTH EVERY 6 HOURS AS NEEDED FOR NAUSEA AND VOMITING 90 tablet 1   tadalafil (CIALIS) 20 MG tablet Take 0.5-1 tablets (10-20 mg total) by mouth every other day as needed for erectile dysfunction. 20 tablet 11   torsemide 40 MG TABS Take 40 mg by mouth daily. Increase to 1 tablet (40 mg total) by mouth TWICE daily (total daily dose 80 mg) as needed for up to 3 days for increased leg swelling, shortness of breath, weight gain 5+ lbs over 1-2 days. Seek medical care if these symptoms are not improving with increased dose. (Patient taking differently: Take 20 mg by mouth daily. Increase to 1 tablet (40 mg total) by mouth TWICE daily (total daily dose 80 mg) as needed for up to 3 days for increased leg swelling, shortness of breath, weight gain 5+ lbs over 1-2 days. Seek medical care if these symptoms are not improving with increased dose.) 45 tablet 0   triamcinolone cream (KENALOG) 0.1 % Apply 1 Application topically 3 (three) times daily. (Patient taking differently: Apply 1 Application topically 3 (three) times daily as needed (for bilateral, below-the-knees skin irritation).) 453.6 g 1   dexamethasone (DECADRON) 4 MG tablet TAKE 2 TABLETS DAILY X 3 DAYS STARTING THE DAY AFTER CISPLATIN CHEMOTHERAPY. TAKE WITH FOOD. (Patient not taking: Reported on 02/20/2023) 30 tablet 1   No current facility-administered medications for this visit.    Allergies:   Patient has no known allergies.   Social History:  The patient  reports that he quit smoking about 42 years ago. His smoking use included cigarettes. He has a 3.00 pack-year smoking history. He has never used smokeless tobacco. He reports that he does not currently use alcohol. He reports that he does not use drugs.   Family  History:   family history includes Breast cancer in his mother; Colon cancer in his maternal grandmother; Emphysema in his father.    Review of Systems: Review of Systems  Constitutional: Negative.   HENT: Negative.    Respiratory: Negative.    Cardiovascular: Negative.   Gastrointestinal: Negative.   Musculoskeletal: Negative.   Neurological:  Positive for dizziness.  Psychiatric/Behavioral: Negative.    All other systems reviewed and are negative.   PHYSICAL EXAM: VS:  BP (!) 88/70 (BP Location: Left Arm, Patient Position: Sitting, Cuff Size: Normal)   Pulse 85   Ht 6' 3" (1.905 m)   Wt 286 lb 2 oz (129.8 kg)   SpO2 95%   BMI 35.76 kg/m  , BMI Body mass index is 35.76 kg/m. GEN: Well nourished, well developed, in no acute distress HEENT: normal Neck: no JVD, carotid bruits, or masses Cardiac: Irregularly irregular; no murmurs, rubs, or gallops,no edema    Respiratory:  clear to auscultation bilaterally, normal work of breathing GI: soft, nontender, nondistended, + BS MS: no deformity or atrophy Skin: warm and dry, no rash Neuro:  Strength and sensation are intact Psych: euthymic mood, full affect  Recent Labs: 02/13/2023: B Natriuretic Peptide 119.8; TSH 0.269 03/12/2023: ALT 12; BUN 9; Creatinine 1.05; Hemoglobin 11.6; Magnesium 1.7; Platelet Count 170; Potassium 3.5; Sodium 136    Lipid Panel Lab Results  Component Value Date   CHOL 172 08/08/2022   HDL 74.30 08/08/2022   LDLCALC 84 08/08/2022   TRIG 66.0 08/08/2022      Wt Readings from Last 3 Encounters:  03/13/23 286 lb 2 oz (129.8 kg)  03/12/23 286 lb (129.7 kg)  03/07/23 287 lb 4.8 oz (130.3 kg)    ASSESSMENT AND PLAN:  Problem List Items Addressed This Visit       Cardiology Problems   Atrial fibrillation (HCC) (Chronic)   Acute diastolic CHF (congestive heart failure) (HCC)   Hyperlipemia   Essential hypertension   Atrial flutter with rapid ventricular response (HCC)     Other    Cholangiocarcinoma (HCC) - Primary (Chronic)   Other Visit Diagnoses     Anticoagulant long-term use       DVT of deep femoral vein, right (HCC)          Atrial flutter On Lovenox Rate relatively well-controlled on metoprolol Plan for cardioversion next week   Lymphedema/leg swelling Has history of lymphedema and bilateral nonocclusive popliteal DVTs, with occlusive DVT popliteal and tibial vein on right Long history of lower extremity edema, Continue compression hose  hold off on lymphedema compression pumps in the setting of DVTs    Abnormal TSH with multinodular thyroid Low TSH, normal T4   Anemia Hemoglobin trending back upwards now 11.6  on Lovenox SQ twice daily Followed by hematology oncology   Diastolic CHF EF 50 to 55%, dilated IVC Likely exacerbated by anemia, atrial flutter with rapid rate Rate control with metoprolol tartrate as above, In the setting of diarrhea and hypotension/orthostasis today, recommend he hold torsemide for at least 3 days until diarrhea resolves and he is hydrated   Orthostatic Blood pressure down to 88/52 with standing, feeling dizzy In the setting of torsemide, diarrhea the past 4 days Plan as above, will hold torsemide 3 days, hydrate, once diarrhea resolves could restart torsemide 20 daily   Total encounter time more than 40 minutes  Greater than 50% was spent in counseling and coordination of care with the patient    Signed, Tim Nicci Vaughan, M.D., Ph.D. [facility], [place] [phone] 

## 2023-03-12 ENCOUNTER — Encounter: Payer: Self-pay | Admitting: Oncology

## 2023-03-12 ENCOUNTER — Other Ambulatory Visit: Payer: Self-pay

## 2023-03-12 ENCOUNTER — Inpatient Hospital Stay (HOSPITAL_BASED_OUTPATIENT_CLINIC_OR_DEPARTMENT_OTHER): Payer: BC Managed Care – PPO | Admitting: Oncology

## 2023-03-12 ENCOUNTER — Inpatient Hospital Stay: Payer: BC Managed Care – PPO | Attending: Oncology

## 2023-03-12 VITALS — BP 114/76 | HR 96 | Temp 97.5°F | Resp 18 | Wt 286.0 lb

## 2023-03-12 DIAGNOSIS — R918 Other nonspecific abnormal finding of lung field: Secondary | ICD-10-CM | POA: Diagnosis not present

## 2023-03-12 DIAGNOSIS — K5909 Other constipation: Secondary | ICD-10-CM

## 2023-03-12 DIAGNOSIS — D6861 Antiphospholipid syndrome: Secondary | ICD-10-CM | POA: Diagnosis not present

## 2023-03-12 DIAGNOSIS — T451X5A Adverse effect of antineoplastic and immunosuppressive drugs, initial encounter: Secondary | ICD-10-CM

## 2023-03-12 DIAGNOSIS — K5900 Constipation, unspecified: Secondary | ICD-10-CM | POA: Diagnosis not present

## 2023-03-12 DIAGNOSIS — D6481 Anemia due to antineoplastic chemotherapy: Secondary | ICD-10-CM | POA: Diagnosis not present

## 2023-03-12 DIAGNOSIS — C7951 Secondary malignant neoplasm of bone: Secondary | ICD-10-CM | POA: Insufficient documentation

## 2023-03-12 DIAGNOSIS — C221 Intrahepatic bile duct carcinoma: Secondary | ICD-10-CM | POA: Insufficient documentation

## 2023-03-12 DIAGNOSIS — I429 Cardiomyopathy, unspecified: Secondary | ICD-10-CM | POA: Diagnosis not present

## 2023-03-12 DIAGNOSIS — I4819 Other persistent atrial fibrillation: Secondary | ICD-10-CM

## 2023-03-12 DIAGNOSIS — I4891 Unspecified atrial fibrillation: Secondary | ICD-10-CM | POA: Diagnosis not present

## 2023-03-12 DIAGNOSIS — R978 Other abnormal tumor markers: Secondary | ICD-10-CM | POA: Diagnosis not present

## 2023-03-12 LAB — CBC WITH DIFFERENTIAL (CANCER CENTER ONLY)
Abs Immature Granulocytes: 0.01 10*3/uL (ref 0.00–0.07)
Basophils Absolute: 0 10*3/uL (ref 0.0–0.1)
Basophils Relative: 1 %
Eosinophils Absolute: 0 10*3/uL (ref 0.0–0.5)
Eosinophils Relative: 1 %
HCT: 36.3 % — ABNORMAL LOW (ref 39.0–52.0)
Hemoglobin: 11.6 g/dL — ABNORMAL LOW (ref 13.0–17.0)
Immature Granulocytes: 0 %
Lymphocytes Relative: 10 %
Lymphs Abs: 0.6 10*3/uL — ABNORMAL LOW (ref 0.7–4.0)
MCH: 30.6 pg (ref 26.0–34.0)
MCHC: 32 g/dL (ref 30.0–36.0)
MCV: 95.8 fL (ref 80.0–100.0)
Monocytes Absolute: 0.8 10*3/uL (ref 0.1–1.0)
Monocytes Relative: 13 %
Neutro Abs: 4.5 10*3/uL (ref 1.7–7.7)
Neutrophils Relative %: 75 %
Platelet Count: 170 10*3/uL (ref 150–400)
RBC: 3.79 MIL/uL — ABNORMAL LOW (ref 4.22–5.81)
RDW: 14.5 % (ref 11.5–15.5)
WBC Count: 5.9 10*3/uL (ref 4.0–10.5)
nRBC: 0 % (ref 0.0–0.2)

## 2023-03-12 LAB — CMP (CANCER CENTER ONLY)
ALT: 12 U/L (ref 0–44)
AST: 23 U/L (ref 15–41)
Albumin: 3.3 g/dL — ABNORMAL LOW (ref 3.5–5.0)
Alkaline Phosphatase: 48 U/L (ref 38–126)
Anion gap: 13 (ref 5–15)
BUN: 9 mg/dL (ref 8–23)
CO2: 23 mmol/L (ref 22–32)
Calcium: 8.9 mg/dL (ref 8.9–10.3)
Chloride: 100 mmol/L (ref 98–111)
Creatinine: 1.05 mg/dL (ref 0.61–1.24)
GFR, Estimated: >60 mL/min (ref >60)
Glucose, Bld: 96 mg/dL (ref 70–99)
Potassium: 3.5 mmol/L (ref 3.5–5.1)
Sodium: 136 mmol/L (ref 135–145)
Total Bilirubin: 0.6 mg/dL (ref 0.3–1.2)
Total Protein: 6 g/dL — ABNORMAL LOW (ref 6.5–8.1)

## 2023-03-12 LAB — PHOSPHORUS: Phosphorus: 7.3 mg/dL — ABNORMAL HIGH (ref 2.5–4.6)

## 2023-03-12 LAB — MAGNESIUM: Magnesium: 1.7 mg/dL (ref 1.7–2.4)

## 2023-03-12 MED ORDER — ENOXAPARIN SODIUM 150 MG/ML IJ SOSY: 1.0000 mg/kg | PREFILLED_SYRINGE | Freq: Two times a day (BID) | INTRAMUSCULAR | 0 refills | Status: DC

## 2023-03-12 NOTE — Assessment & Plan Note (Addendum)
#  Recurrent lower extremity DVT, secondary to antiphospholipid syndrome.-. Need  life time anticoagulation Continue  Lovenox 1 mg/kg twice daily, as of today, recommend 130mg  BID as he has lost weight.  I have discussed in details with patient regarding dosage adjustment based on his weight

## 2023-03-12 NOTE — Assessment & Plan Note (Signed)
Hemoglobin has improved.

## 2023-03-12 NOTE — Assessment & Plan Note (Signed)
Discussed about bowel regimen.  

## 2023-03-12 NOTE — Progress Notes (Addendum)
Hematology/Oncology Progress note [phone] [phone]    CHIEF COMPLAINTS/REASON FOR VISIT:  Follow up for right lower extremity DVT, antiphospholipid syndrome, metastatic cholangiocarcinoma.  ASSESSMENT & PLAN:   Cancer Staging  Cholangiocarcinoma [facility]) Staging form: Intrahepatic Bile Duct, AJCC 8th Edition - Clinical stage from 08/27/2022: Stage IV (cT2, cN1, cM1) - Signed by [name], MD on 09/13/2022   Cholangiocarcinoma (HCC) Cholangiocarcinoma, NGS-Tempus liquid biopsy FGFR2 -ADK mutation, TMB 0,  S/p Gemcitabine, Cisplatin and Durvalumab x 6 , 01/2023 CT progression/enlarged liver lesions --. 03/03/23 2nd line Futibatinib 20mg  daily Overall he tolerates well.  Recommend patient to see ophthalmologist for his eye complaints.    Antiphospholipid syndrome (HCC) #Recurrent lower extremity DVT, secondary to antiphospholipid syndrome.-. Need  life time anticoagulation Continue  Lovenox 1 mg/kg twice daily, as of today, recommend 130mg  BID as he has lost weight.  I have discussed in details with patient regarding dosage adjustment based on his weight  Lung infiltrate on CT He follows up with pulmonology.  DDX includes organizing pneumonia, ILD, aspiration, lymphatic cancer spread, etc. He declined repeat bronchoscopy/biopsy.     Hypocalcemia Continue calcium supplementation, increase to 1000mg  daily.  Recommend Vitamin D 1000 units daily.   Metastasis to bone [facility]) S/p palliative  radiation  Consider bisphosphonate after dental clearance is obtained.   Antineoplastic chemotherapy induced anemia Hemoglobin has improved.    Atrial fibrillation (HCC) And cardiomyopathy, follow up with cardiology Rate controlled.   Hypomagnesemia Continue slow Mag to twice daily   Constipation Discussed about bowel regimen.    Hyperphosphatemia Side effects from Futibatinib.  Recommend increase calcium carbonate 500mg  to three times daily. [ He is currently  on 2 tabs daily]  Recommend low phosphorus diet- refer to nutritionist for further education.  repeat phosphorus early next week.     Orders Placed This Encounter  Procedures   Magnesium    Standing Status:   Future    Number of Occurrences:   1    Standing Expiration Date:   03/11/2024   Phosphorus    Standing Status:   Future    Number of Occurrences:   1    Standing Expiration Date:   03/11/2024   CBC with Differential (Cancer Center Only)    Standing Status:   Future    Standing Expiration Date:   03/11/2024   CMP (Cancer Center only)    Standing Status:   Future    Standing Expiration Date:   03/11/2024   Phosphorus    Standing Status:   Future    Standing Expiration Date:   03/11/2024   Magnesium    Standing Status:   Future    Standing Expiration Date:   03/11/2024   CEA    Standing Status:   Future    Standing Expiration Date:   03/11/2024   Cancer antigen 19-9    Standing Status:   Future    Standing Expiration Date:   03/11/2024   Ambulatory Referral to [facility] Nutrition    Referral Priority:   Routine    Referral Type:   Consultation    Referral Reason:   Specialty Services Required    Number of Visits Requested:   1   Follow up 2 weeks  All questions were answered. The patient knows to call the clinic with any problems, questions or concerns.  [name], MD, PhD [facility] Hematology Oncology 03/12/2023    HISTORY OF PRESENTING ILLNESS:  Patient reports remote history of left lower extremity DVT  in 2002.  He was initiated on Lovenox and bridged to Coumadin.  Patient took warfarin for 2 years before anticoagulation was stopped. 12/03/2021, patient presented to emergency room for evaluation of right lower extremity pain and swelling for about a week.  Started on the inner side of right thigh and migrated to the right calf. + Associated with swelling.  Denies any recent injury, hospitalization, surgery.  He first noticed the symptoms after playing basketball with his  grandson.   12/03/2021, right lower extremity ultrasound showed occlusive DVT extending from the mid aspect of the femoral vein through the imaged tibial vein.  Age-indeterminate. Patient was started on Xarelto. He was referred to establish care with vascular surgeon and was seen by Sheppard Plumber on 12/06/2021.  Shared decision was made not to proceed with embolectomy.  Continue anticoagulation. Patient was referred to hematology oncology for further evaluation.  Patient denies any family history of blood clots.  Denies any unintentional weight loss, fever or night sweats. 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  He sometimes stay in his car while waiting for next assessment appointment.  He reports the right lower extremity symptom has improved since the start of Xarelto.  No active bleeding events.  #12/18/2021 hypercoagulable work-up showed JAK2 V617F mutation negative, with reflex to other mutations CALR, MPL, JAK 2 Ex 12-15 mutations negative, negative anticardiolipin IgG and IgM antibodies, positive lupus anticoagulant, negative factor V Leiden mutation, negative prothrombin gene mutation, normal protein C activity, normal protein S antigen level. Patient was recommended to switch to Coumadin with INR goal of 2-3.  #03/15/2022, repeat lupus anticoagulant is persistently positive- + antiphospholipid syndrome.  Patient is currently on Lovenox 1 mg/kg twice daily.  admitted due to multifocal pneumonia, treated with Influenza panel negative, COVID-negative, HIV negative, Complete course of antibiotics with Zithromax, Vantin   + HCV antibody positive, HCV RNA quantification not detected. HCV RT PCR not detected.   INTERVAL HISTORY SHAH CHARON is a 62 y.o. male who has above history reviewed by me today presents for follow up visit for management of right lower extremity DVT and antiphospholipid  syndrome, metastatic cholangiocarcinoma.  Oncology History  Cholangiocarcinoma (HCC)  10/30/2021 Imaging   CT chest wo contrast 1. Heterogeneous bilateral ground-glass disease, multilobar but worst in the right middle lobe, lingula and lower lobes. There is progression of the abnormality since 10/12/2022. Though pneumonia could produce this appearance, given persistence and fluctuating appearance since October, consider alternative entities such as hypersensitivity pneumonitis or idiopathic interstitial pneumonia. 2. Slightly enlarged pulmonary trunk, possible arterial hypertension 3. Redemonstrated large hypodense right hepatic lobe mass.   08/18/2022 Imaging   CT chest w contrast showed 1. Multifocal airspace disease in both lungs, left side greater than right. Findings are most compatible with multifocal pneumonia. 2. Multiple new hepatic lesions with enlarging lymph nodes in the upper abdomen and chest. Findings are concerning for metastatic disease. Based on the multifocal pneumonia, hepatic abscesses would also be in the differential diagnosis. Recommend further characterization of the abdomen and pelvis with CT with IV contrast. 3. Enlargement of the main pulmonary artery could be associated with pulmonary hypertension. 4. Coronary artery calcifications. 5. Multinodular goiter. Patient has known thyroid nodules and previous thyroid ultrasound.    08/18/2022 Imaging   CT abdomen pelvis w contrast  Interval development of 6.5 x 5.2 cm heterogeneously enhancing mass in right hepatic lobe. This is highly concerning for  neoplasm or malignancy, and further evaluation with MRI is recommended.   Mildly enlarged periaortic adenopathy is noted, with the largest lymph node measuring 12 mm. Metastatic disease cannot be excluded. Mildly enlarged adenopathy is also noted in the gastrohepatic ligament, but this is unchanged compared to prior exam.   Stable bibasilar lung opacities are noted concerning  for inflammation, atelectasis or possibly scarring.   Grossly stable multi-septated cystic lesion is seen involving upper pole of right kidney with peripheral calcifications compared to prior exam of 2019, most likely representing benign etiology.   Aortic Atherosclerosis    08/18/2022 - 08/21/2022 Hospital Admission   Admitted due to multifocal pneumonia, treated with Influenza panel negative, COVID-negative, HIV negative, Complete course of antibiotics with Zithromax, Vantin     08/19/2022 Imaging   MR abdomen w wo contrast  Marked caudate lobe hypertrophy, highly suspicious for hepatic cirrhosis.   Numerous hypervascular masses throughout the right hepatic lobe, highly suspicious for multifocal hepatocellular carcinoma. Recommend correlation with AFP and consider tissue sampling.   Mild abdominal lymphadenopathy, with differential diagnosis including metastatic disease and reactive lymphadenopathy in setting of cirrhosis.   Bilateral lower lobe infiltrates, as better demonstrated on recent CT.    08/19/2022 Tumor Marker   AFP 4.1   08/27/2022 Initial Diagnosis   Cholangiocarcinoma  -08/20/22 Liver mass biopsy showed poorly differentiated carcinoma Immunohistochemical stains show that the poorly differentiated carcinoma is positive for CK7 and MOC-31, suggestive of a poorly differentiated adenocarcinoma.  Immunohistochemical stains for CK20, CDX2, HepPar 1 and arginase are negative.  Immunostain for glypican-3 shows very focal  likely nonspecific labeling.  This immunoprofile is nonspecific but can be compatible with a poorly differentiated primary cholangiocarcinoma in absence of any other lesions.   NGS showed BAP 1 mutation.  Case was presented at tumor board, I have also discussed with pathologist Dr.Rubinas,  IHC pattern is more consistent with adenocarcinoma, unlikely HCC [due to negative HepPar 1 and arginase], cholangiocarcinoma is favored, although this is a diagnosis of  exclusion. Second opinion ar [facility]   08/27/2022 Cancer Staging   Staging form: Intrahepatic Bile Duct, AJCC 8th Edition - Clinical stage from 08/27/2022: Stage IV (cT2, cN1, cM1) - Signed by [name], MD on 09/13/2022 Stage prefix: Initial diagnosis   08/27/2022 Tumor Marker   CA 19.9  16 CEA 2.8   09/12/2022 Imaging   PET scan showed 1. Large right hepatic lobe mass is hypermetabolic and consistent with known cholangiocarcinoma. 2. Scattered borderline enlarged upper abdominal lymph nodes do not show any significant FDG uptake. 3. 4 hypermetabolic bone lesions consistent with metastatic disease. 4. Progressive diffuse airspace process in the left lung could be due to atypical pneumonia, pulmonary hemorrhage or left-sided aspiration. 5. Aortic atherosclerosis.  09/12/2022 I had a phone discussion with patient after his PET scan resulted.  Recommend systemic chemotherapy.  Patient would like to defer until his second opinion visit at [facility].   09/15/2022 Imaging   CT angio chest pulmonary embolism protocol showed 1. No evidence for pulmonary embolism. 2. Progression of bilateral multifocal patchy ground-glass and interstitial opacities, left greater than right. Findings are concerning for multifocal pneumonia. 3. Air-fluid levels in the left lower lobe bronchi worrisome for aspiration. 4. Mediastinal and hilar lymphadenopathy has increased in size and number when compared to the prior study, likely reactive.5. Cardiomegaly. 6. Findings compatible with pulmonary artery hypertension.7. Stable right liver mass and complex right renal cystic lesion. 8. Stable enlargement of the left thyroid.   09/15/2022 - 09/17/2022 Hospital  Admission   Patient was admitted due to shortness of breath, CT showed progression of bilateral multifocal patchy groundglass and interstitial opacities.  Left greater than right.  Concerning for multifocal pneumonia.  Patient was started on broad-spectrum antibiotics with  significant improvement.  He was discharged on oral antibiotics.   09/19/2022 Miscellaneous   Patient went to [facility] for second opinion.  He was seen by Shirlee Latch, who felt that the presentation is consistent with metastatic cholangiocarcinoma, and agrees with the plan of cisplatin/gemcitabine/durvalumab..    10/11/2022 -  Chemotherapy   Patient is on Treatment Plan : Cisplatin D1,8 + Gemcitabine + Durvalumab  D1,8 q21d x     10/12/2022 Imaging   CT chest w contrast 1. Bilateral airspace and ground-glass opacities have mildly decreased in amount and density when compared to the prior study. Findings are compatible with resolving multifocal pneumonia. Continued follow-up imaging recommended. 2. Stable enlarged heterogeneous left thyroid gland with 3.2 cm nodule. This can be further evaluated with dedicated ultrasound. 3. Stable hepatic and right renal lesions, incompletely evaluated.   Aortic Atherosclerosis (ICD10-I70.0).   12/11/2022 Imaging   PET showed  1. There is been mild increase in size of the tracer avid mass within right lobe of liver compatible with known cholangiocarcinoma. The degree of tracer uptake however is slightly decreased in the interval. 2. Multifocal tracer avid bone metastases are again noted. When compared with the previous exam there has been an mixed interval response to therapy. Specifically, there has been interval increase in size and FDG uptake associated with the right ischial tuberosity lesion and there is a new lesion within the posterior left iliac bone. Decreased tracer uptake is associated with the left scapular lesion and there is stable tracer uptake associated with the right T1 transverse process lesion. 3. Persistent ground-glass and airspace densities within the left upper lobe and left lower lobe compatible with inflammatory or infectious process. Interval resolution of previous right lung ground-glass and airspace densities.    12/24/2022 Procedure    Medi port placement by Dr. Wyn Quaker   01/22/2023 Imaging   CT chest abdomen pelvis with contrast 1. Hypodense lesion of the right lobe of the liver, hepatic segment VII/VIII is slightly diminished in size, consistent with treatment response. 2. No evident change in additional smaller hypodense lesions scattered throughout the right lobe of the liver, these more difficult to see on prior noncontrast PET-CT. 3. Unchanged enlarged celiac axis, gastrohepatic ligament, and retroperitoneal lymph nodes. Unchanged prominent high right paratracheal lymph nodes. 4. Unchanged osseous metastases. 5. Slightly improved, although still extensive ground-glass airspace opacity primarily seen throughout the left lower lobe and lingula,nonspecific and infectious or inflammatory. 6. Coronary artery disease. 7. Emphysema.   02/11/2023 Imaging   Patient self decreased Lovenox dosage to 120mg  once daily.  Increased lower extremity swelling.   02/11/23 [place] bilateral lower extremity showed 1. Nonocclusive DVT within the popliteal veins bilaterally. 2. Occlusive DVT within the posterior tibial veins of the right calf.       02/13/2023 Imaging   CT chest Angiogram  1. Technically adequate exam showing no acute pulmonary embolus. 2. Cardiomegaly. Coronary artery calcifications. 3. Bilateral pleural effusions. 4. Improved aeration of the RIGHT UPPER and LOWER lobes compared to prior study. 5. LEFT LOWER lobe and lingula airspace filling and atelectasis, slightly improved compared to prior study. 6. Cirrhotic morphology of the liver. 7.7 centimeters mass in the RIGHT hepatic lobe. 7. Gastrohepatic ligament adenopathy. 8. A 7 millimeters sclerotic lesion in the UPPER  sternum is suspicious for metastatic disease. 9. LEFT thyroid goiter.   02/13/2023 - 02/15/2023 Hospital Admission   A flutter with variable A-V block. Heart rate up to 140.  BNP 119.8 04/18: Echo no overt CHF - EF 50-55  Cardiology recs beta blocker  to metoprolol succinate 50 mg bid, Torsemid 40mg  daily Adjusting Lovenox 14mg  BID.     INTERVAL HISTORY WOODS MASCOLA is a 62 y.o. male who has above history reviewed by me today presents for follow up visit for  Cholangiocarcinoma, antiphospholipid syndrome, thrombosis He tolerated  Futibatinib chemotherapy.  He has developed constipation.  He uses his bowel regimen and finally has been able to get good bowel movements.  He has not utilized lactulose yet. He takes Lovenox 140mg  BID currently.  On Torsemide, he has an upcoming follow-up appointment with cardiology.  Appetite is fair.  He has lost some weight. He has noticed some vision changes since the start of medication.  Everything is more bright.  Review of Systems  Constitutional:  Positive for fatigue. Negative for appetite change, chills and fever.  HENT:   Negative for hearing loss and voice change.   Eyes:  Positive for eye problems.  Respiratory:  Negative for chest tightness, cough and shortness of breath.   Cardiovascular:  Positive for leg swelling. Negative for chest pain.  Gastrointestinal:  Positive for constipation. Negative for abdominal distention, abdominal pain and blood in stool.  Endocrine: Negative for hot flashes.  Genitourinary:  Negative for difficulty urinating and frequency.   Musculoskeletal:  Negative for arthralgias.  Skin:  Negative for itching and rash.  Neurological:  Negative for extremity weakness.  Hematological:  Negative for adenopathy. Bruises/bleeds easily.  Psychiatric/Behavioral:  Negative for confusion.     MEDICAL HISTORY:  Past Medical History:  Diagnosis Date   Antiphospholipid antibody syndrome (HCC)    Arthritis    Cancer (HCC)    Diabetes mellitus without complication (HCC)    DVT (deep venous thrombosis) (HCC)    Lt leg   Dyspnea    Elevated lipids    Erythrocytosis    Hyperlipidemia    Hypertension    Lower extremity edema    Multinodular thyroid    Post-thrombotic  syndrome     SURGICAL HISTORY: Past Surgical History:  Procedure Laterality Date   COLONOSCOPY WITH PROPOFOL N/A 09/23/2018   Per Dr. Norma Fredrickson, polyp, repeat in 3 yrs    EYE MUSCLE SURGERY     JOINT REPLACEMENT Left 07/22/2017   PORTA CATH INSERTION N/A 12/24/2022   Procedure: PORTA CATH INSERTION;  Surgeon: Annice Needy, MD;  Location: [facility] INVASIVE CV LAB;  Service: Cardiovascular;  Laterality: N/A;   TOTAL HIP ARTHROPLASTY Left 07/22/2017   Procedure: TOTAL HIP ARTHROPLASTY;  Surgeon: Donato Heinz, MD;  Location: [facility] ORS;  Service: Orthopedics;  Laterality: Left;    SOCIAL HISTORY: Social History   Socioeconomic History   Marital status: Married    Spouse name: Not on file   Number of children: Not on file   Years of education: Not on file   Highest education level: Not on file  Occupational History   Not on file  Tobacco Use   Smoking status: Former    Packs/day: 0.50    Years: 6.00    Additional pack years: 0.00    Total pack years: 3.00    Types: Cigarettes    Quit date: 83    Years since quitting: 42.3   Smokeless tobacco: Never   Tobacco  comments:    Pt states when he did smoke he smoked at most 1 ppd. ALS 09/26/2022  Vaping Use   Vaping Use: Never used  Substance and Sexual Activity   Alcohol use: Yes    Comment: occ   Drug use: No   Sexual activity: Not on file  Other Topics Concern   Not on file  Social History Narrative   Not on file   Social Determinants of Health   Financial Resource Strain: Low Risk  (08/27/2022)   Overall Financial Resource Strain (CARDIA)    Difficulty of Paying Living Expenses: Not very hard  Food Insecurity: No Food Insecurity (02/18/2023)   Hunger Vital Sign    Worried About Running Out of Food in the Last Year: Never true    Ran Out of Food in the Last Year: Never true  Transportation Needs: No Transportation Needs (02/18/2023)   PRAPARE - Administrator, Civil Service (Medical): No    Lack of  Transportation (Non-Medical): No  Physical Activity: Insufficiently Active (08/27/2022)   Exercise Vital Sign    Days of Exercise per Week: 3 days    Minutes of Exercise per Session: 30 min  Stress: Stress Concern Present (08/27/2022)   [employer] of Occupational Health - Occupational Stress Questionnaire    Feeling of Stress : Rather much  Social Connections: Socially Integrated (08/27/2022)   Social Connection and Isolation Panel [NHANES]    Frequency of Communication with Friends and Family: More than three times a week    Frequency of Social Gatherings with Friends and Family: More than three times a week    Attends Religious Services: 1 to 4 times per year    Active Member of [employer] or Organizations: Yes    Attends Banker Meetings: Never    Marital Status: Married  Catering manager Violence: Not At Risk (02/14/2023)   Humiliation, Afraid, Rape, and Kick questionnaire    Fear of Current or Ex-Partner: No    Emotionally Abused: No    Physically Abused: No    Sexually Abused: No    FAMILY HISTORY: Family History  Problem Relation Age of Onset   Breast cancer Mother    Emphysema Father    Colon cancer Maternal Grandmother     ALLERGIES:  has No Known Allergies.  MEDICATIONS:  Current Outpatient Medications  Medication Sig Dispense Refill   albuterol (VENTOLIN HFA) 108 (90 Base) MCG/ACT inhaler TAKE 2 PUFFS BY MOUTH EVERY 4 HOURS AS NEEDED 8.5 each 1   Calcium Carbonate 500 MG CHEW Chew 2 tablets (1,000 mg total) by mouth daily. 60 tablet 0   glucose blood test strip 1 each by Other route as needed for other. accu chek aviva plusUse as instructed     Lactulose 20 GM/30ML SOLN Take 30 mLs (20 g total) by mouth daily as needed (constipation.). 450 mL 0   lidocaine-prilocaine (EMLA) cream Apply 1 Application topically as needed. 30 g 5   LORazepam (ATIVAN) 0.5 MG tablet TAKE 1 TABLET (0.5 MG TOTAL) BY MOUTH EVERY 8 (EIGHT) HOURS AS NEEDED FOR ANXIETY (NAUSEA  VOMITING). 60 tablet 0   magnesium chloride (SLOW-MAG) 64 MG TBEC SR tablet Take 1 tablet (64 mg total) by mouth 2 (two) times daily. 60 tablet 0   metFORMIN (GLUCOPHAGE) 500 MG tablet TAKE TWO TABLETS BY MOUTH EVERY MORNING WITH BREAKFAST 180 tablet 1   Metoprolol Tartrate 75 MG TABS Take 1 tablet (75 mg total) by mouth 2 (two) times  daily. 60 tablet 0   MUCINEX D MAX STRENGTH [phone] MG TB12 Take 1,200 mg by mouth 2 (two) times daily as needed (for congestion or to loosen mucous in the chest).     ondansetron (ZOFRAN) 8 MG tablet Take 1 tablet (8 mg total) by mouth every 8 (eight) hours as needed for nausea or vomiting. Start on the third day after cisplatin. 90 tablet 1   pravastatin (PRAVACHOL) 20 MG tablet Take 1 tablet (20 mg total) by mouth daily. 90 tablet 3   prochlorperazine (COMPAZINE) 10 MG tablet TAKE 1 TABLET (10 MG TOTAL) BY MOUTH EVERY 6 HOURS AS NEEDED FOR NAUSEA AND VOMITING 90 tablet 1   tadalafil (CIALIS) 20 MG tablet Take 0.5-1 tablets (10-20 mg total) by mouth every other day as needed for erectile dysfunction. 20 tablet 11   torsemide 40 MG TABS Take 40 mg by mouth daily. Increase to 1 tablet (40 mg total) by mouth TWICE daily (total daily dose 80 mg) as needed for up to 3 days for increased leg swelling, shortness of breath, weight gain 5+ lbs over 1-2 days. Seek medical care if these symptoms are not improving with increased dose. (Patient taking differently: Take 20 mg by mouth daily. Increase to 1 tablet (40 mg total) by mouth TWICE daily (total daily dose 80 mg) as needed for up to 3 days for increased leg swelling, shortness of breath, weight gain 5+ lbs over 1-2 days. Seek medical care if these symptoms are not improving with increased dose.) 45 tablet 0   triamcinolone cream (KENALOG) 0.1 % Apply 1 Application topically 3 (three) times daily. (Patient taking differently: Apply 1 Application topically 3 (three) times daily as needed (for bilateral, below-the-knees skin  irritation).) 453.6 g 1   dexamethasone (DECADRON) 4 MG tablet TAKE 2 TABLETS DAILY X 3 DAYS STARTING THE DAY AFTER CISPLATIN CHEMOTHERAPY. TAKE WITH FOOD. (Patient not taking: Reported on 02/20/2023) 30 tablet 1   enoxaparin (LOVENOX) 150 MG/ML injection Inject 0.86 mLs (129 mg total) into the skin every 12 (twelve) hours. 60 mL 0   futibatinib, 20 mg daily dose, (LYTGOBI, 20 MG DAILY DOSE,) 4 MG tablet Take 5 tablets (20 mg total) by mouth daily. (Patient not taking: Reported on 02/20/2023) 70 tablet 1   glipiZIDE (GLUCOTROL) 10 MG tablet Take 1 tablet (10 mg total) by mouth 2 (two) times daily before a meal. (Patient not taking: Reported on 03/12/2023) 180 tablet 3   No current facility-administered medications for this visit.     PHYSICAL EXAMINATION: ECOG PERFORMANCE STATUS: 1 - Symptomatic but completely ambulatory Vitals:   03/12/23 0947  BP: 114/76  Pulse: 96  Resp: 18  Temp: (!) 97.5 F (36.4 C)  SpO2: 94%   Filed Weights   03/12/23 0947  Weight: 286 lb (129.7 kg)    Physical Exam Constitutional:      General: He is not in acute distress. HENT:     Head: Normocephalic and atraumatic.  Eyes:     General: No scleral icterus. Cardiovascular:     Rate and Rhythm: Normal rate.  Pulmonary:     Effort: Pulmonary effort is normal. No respiratory distress.     Breath sounds: No wheezing.  Abdominal:     General: Bowel sounds are normal. There is no distension.  Musculoskeletal:        General: Normal range of motion.     Cervical back: Normal range of motion and neck supple.     Right lower leg:  Edema present.     Left lower leg: Edema present.  Skin:    General: Skin is warm and dry.     Findings: No erythema or rash.  Neurological:     Mental Status: He is alert and oriented to person, place, and time. Mental status is at baseline.     Cranial Nerves: No cranial nerve deficit.  Psychiatric:        Mood and Affect: Mood normal.     LABORATORY DATA:  I have  reviewed the data as listed    Latest Ref Rng & Units 03/12/2023    9:19 AM 02/19/2023    9:54 AM 02/14/2023    5:40 AM  CBC  WBC 4.0 - 10.5 K/uL 5.9  2.9  2.6   Hemoglobin 13.0 - 17.0 g/dL 16.1  9.9  8.3   Hematocrit 39.0 - 52.0 % 36.3  32.0  26.8   Platelets 150 - 400 K/uL 170  385  212       Latest Ref Rng & Units 03/12/2023    9:19 AM 02/19/2023    9:54 AM 02/15/2023    4:47 AM  CMP  Glucose 70 - 99 mg/dL 96  096  045   BUN 8 - 23 mg/dL 9  9  7    Creatinine 0.61 - 1.24 mg/dL 4.09  8.11  9.14   Sodium 135 - 145 mmol/L 136  135  137   Potassium 3.5 - 5.1 mmol/L 3.5  3.7  3.4   Chloride 98 - 111 mmol/L 100  99  102   CO2 22 - 32 mmol/L 23  23  26    Calcium 8.9 - 10.3 mg/dL 8.9  8.1  8.1   Total Protein 6.5 - 8.1 g/dL 6.0  6.4    Total Bilirubin 0.3 - 1.2 mg/dL 0.6  0.6    Alkaline Phos 38 - 126 U/L 48  49    AST 15 - 41 U/L 23  26    ALT 0 - 44 U/L 12  14      RADIOGRAPHIC STUDIES: I have personally reviewed the radiological images as listed and agreed with the findings in the report. ECHOCARDIOGRAM COMPLETE  Result Date: 02/14/2023    ECHOCARDIOGRAM REPORT   Patient Name:   QUADREE ASBERRY Date of Exam: 02/13/2023 Medical Rec #:  [id number]        Height:       75.0 in Accession #:    [id number]       Weight:       309.1 lb Date of Birth:  Mar 23, 1961         BSA:          2.640 m Patient Age:    61 years         BP:           151/77 mmHg Patient Gender: M                HR:           95 bpm. Exam Location:  [facility] Procedure: 2D Echo, Cardiac Doppler and Color Doppler Indications:     R06.00 Dyspnea  History:         Patient has prior history of Echocardiogram examinations, most                  recent 08/19/2022. Signs/Symptoms:Dyspnea; Risk  Factors:Hypertension, Dyslipidemia and Diabetes. Lower                  extremity edema.  Sonographer:     Daphine Deutscher RDCS Referring Phys:  [id number] SHERI HAMMOCK Diagnosing Phys: Julien Nordmann MD IMPRESSIONS  1. Left  ventricular ejection fraction, by estimation, is 50 to 55%. The left ventricle has low normal function. The left ventricle has no regional wall motion abnormalities. Left ventricular diastolic parameters are indeterminate.  2. Right ventricular systolic function is normal. The right ventricular size is normal. Tricuspid regurgitation signal is inadequate for assessing PA pressure.  3. The mitral valve is normal in structure. Mild mitral valve regurgitation. No evidence of mitral stenosis.  4. The aortic valve has an indeterminant number of cusps. Aortic valve regurgitation is not visualized. Aortic valve sclerosis is present, with no evidence of aortic valve stenosis.  5. The inferior vena cava is dilated in size with <50% respiratory variability, suggesting right atrial pressure of 15 mmHg. FINDINGS  Left Ventricle: Left ventricular ejection fraction, by estimation, is 50 to 55%. The left ventricle has low normal function. The left ventricle has no regional wall motion abnormalities. The left ventricular internal cavity size was normal in size. There is no left ventricular hypertrophy. Left ventricular diastolic parameters are indeterminate. Right Ventricle: The right ventricular size is normal. No increase in right ventricular wall thickness. Right ventricular systolic function is normal. Tricuspid regurgitation signal is inadequate for assessing PA pressure. Left Atrium: Left atrial size was normal in size. Right Atrium: Right atrial size was normal in size. Pericardium: There is no evidence of pericardial effusion. Mitral Valve: The mitral valve is normal in structure. Mild mitral valve regurgitation. No evidence of mitral valve stenosis. Tricuspid Valve: The tricuspid valve is normal in structure. Tricuspid valve regurgitation is not demonstrated. No evidence of tricuspid stenosis. Aortic Valve: The aortic valve has an indeterminant number of cusps. Aortic valve regurgitation is not visualized. Aortic valve  sclerosis is present, with no evidence of aortic valve stenosis. Pulmonic Valve: The pulmonic valve was normal in structure. Pulmonic valve regurgitation is not visualized. No evidence of pulmonic stenosis. Aorta: The aortic root is normal in size and structure. Venous: The inferior vena cava is dilated in size with less than 50% respiratory variability, suggesting right atrial pressure of 15 mmHg. IAS/Shunts: No atrial level shunt detected by color flow Doppler.  LEFT VENTRICLE PLAX 2D LVIDd:         5.10 cm LVIDs:         3.60 cm LV PW:         0.80 cm LV IVS:        0.80 cm LVOT diam:     2.30 cm LV SV:         98 LV SV Index:   37 LVOT Area:     4.15 cm  RIGHT VENTRICLE             IVC RV Basal diam:  3.90 cm     IVC diam: 2.90 cm RV S prime:     16.80 cm/s TAPSE (M-mode): 2.2 cm LEFT ATRIUM             Index        RIGHT ATRIUM           Index LA diam:        4.80 cm 1.82 cm/m   RA Area:     19.10 cm LA Vol (A2C):   59.9  ml 22.69 ml/m  RA Volume:   52.80 ml  20.00 ml/m LA Vol (A4C):   75.2 ml 28.48 ml/m LA Biplane Vol: 70.3 ml 26.62 ml/m  AORTIC VALVE LVOT Vmax:   133.00 cm/s LVOT Vmean:  96.950 cm/s LVOT VTI:    0.236 m  AORTA Ao Root diam: 3.90 cm Ao Asc diam:  3.30 cm MV E velocity: 126.25 cm/s                             SHUNTS                             Systemic VTI:  0.24 m                             Systemic Diam: 2.30 cm Julien Nordmann MD Electronically signed by Julien Nordmann MD Signature Date/Time: 02/14/2023/11:51:33 AM    Final    CT Angio Chest PE W and/or Wo Contrast  Result Date: 02/13/2023 CLINICAL DATA:  Pulmonary embolism suspected. High probability DVT. Dyspnea. EXAM: CT ANGIOGRAPHY CHEST WITH CONTRAST TECHNIQUE: Multidetector CT imaging of the chest was performed using the standard protocol during bolus administration of intravenous contrast. Multiplanar CT image reconstructions and MIPs were obtained to evaluate the vascular anatomy. RADIATION DOSE REDUCTION: This exam was  performed according to the departmental dose-optimization program which includes automated exposure control, adjustment of the mA and/or kV according to patient size and/or use of iterative reconstruction technique. CONTRAST:  50mL OMNIPAQUE IOHEXOL 350 MG/ML SOLN COMPARISON:  02/01/2023 FINDINGS: Cardiovascular: Heart is mildly enlarged. No pericardial effusion. There are coronary artery calcifications. The pulmonary arteries are moderately well opacified and there is no acute pulmonary embolus. RIGHT-sided port tip extends to the level of the LOWER superior vena cava/UPPER RIGHT atrium. Mediastinum/Nodes: The LEFT lobe of the thyroid gland is enlarged and nodular, consistent with goiter. The appearance is unchanged. Esophagus is unremarkable. There are scattered sub centimeter mediastinal and axillary lymph nodes. Lungs/Pleura: Airways are patent. There are bilateral pleural effusions. LEFT LOWER lobe airspace filling and atelectasis are noted, slightly improved compared to prior study. There has been improvement in RIGHT UPPER and LOWER lobe aeration compared to prior study. Upper Abdomen: There is marked heterogeneous appearance of the liver. The mass previously noted in the RIGHT hepatic lobe is estimated to measure 7.7 centimeters. Cirrhotic morphology of the liver. There is adenopathy in the gastrohepatic ligament, largest node measuring 1 6 centimeters in short axis. Musculoskeletal: There are degenerative changes in the spine. A sclerotic lesion in the UPPER sternum is 7 millimeters, suspicious for metastatic disease. Review of the MIP images confirms the above findings. IMPRESSION: 1. Technically adequate exam showing no acute pulmonary embolus. 2. Cardiomegaly. Coronary artery calcifications. 3. Bilateral pleural effusions. 4. Improved aeration of the RIGHT UPPER and LOWER lobes compared to prior study. 5. LEFT LOWER lobe and lingula airspace filling and atelectasis, slightly improved compared to prior  study. 6. Cirrhotic morphology of the liver. 7.7 centimeters mass in the RIGHT hepatic lobe. 7. Gastrohepatic ligament adenopathy. 8. A 7 millimeters sclerotic lesion in the UPPER sternum is suspicious for metastatic disease. 9. LEFT thyroid goiter. Electronically Signed   By: Norva Pavlov M.D.   On: 02/13/2023 12:09   DG Chest 2 View  Result Date: 02/11/2023 CLINICAL DATA:  SOB EXAM: CHEST - 2 VIEW COMPARISON:  09/15/2022 FINDINGS: Hyperinflated lungs consistent with COPD. Enlarged cardiac silhouette. Bibasilar minimal consolidation volume loss. Bilateral small pleural effusions. Thoracic degenerative changes. Right-sided Port-A-Cath tip distal SVC. IMPRESSION: Enlarged cardiac silhouette. Hyperinflated lungs consistent with COPD. Small pleural effusions and minimal bibasilar consolidation. Electronically Signed   By: Layla Maw M.D.   On: 02/11/2023 15:22   US Venous Img Lower Bilateral  Result Date: 02/11/2023 CLINICAL DATA:  History of DVT, bilateral lower extremity edema EXAM: BILATERAL LOWER EXTREMITY VENOUS DOPPLER ULTRASOUND TECHNIQUE: Gray-scale sonography with compression, as well as color and duplex ultrasound, were performed to evaluate the deep venous system(s) from the level of the common femoral vein through the popliteal and proximal calf veins. COMPARISON:  None Available. FINDINGS: VENOUS Normal compressibility of the common femoral, and superficial femoral veins. There is nonocclusive thrombus identified within the popliteal veins bilaterally. The posterior tibial veins of the right catheter largely occluded. Left posterior tibial veins are patent. Visualized portions of profunda femoral vein and great saphenous vein unremarkable. Doppler waveforms show normal direction of venous flow, normal respiratory plasticity and response to augmentation. OTHER None. Limitations: none IMPRESSION: 1. Nonocclusive DVT within the popliteal veins bilaterally. 2. Occlusive DVT within the  posterior tibial veins of the right calf. 3. These results will be called to the ordering clinician or representative by the Radiologist Assistant, and communication documented in the PACS or [employer]. Electronically Signed   By: Helyn Numbers M.D.   On: 02/11/2023 15:18

## 2023-03-12 NOTE — Assessment & Plan Note (Signed)
He follows up with pulmonology.  DDX includes organizing pneumonia, ILD, aspiration, lymphatic cancer spread, etc. He declined repeat bronchoscopy/biopsy.    

## 2023-03-12 NOTE — Assessment & Plan Note (Addendum)
Continue slow Mag to twice daily

## 2023-03-12 NOTE — Assessment & Plan Note (Addendum)
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 03/03/23 2nd line Futibatinib 20mg  daily Overall he tolerates well.  Recommend patient to see ophthalmologist for his eye complaints.

## 2023-03-12 NOTE — Assessment & Plan Note (Signed)
Side effects from Futibatinib.  Recommend increase calcium carbonate 500mg  to three times daily. [ He is currently on 2 tabs daily]  Recommend low phosphorus diet- refer to nutritionist for further education.  repeat phosphorus early next week.

## 2023-03-12 NOTE — Assessment & Plan Note (Signed)
And cardiomyopathy, follow up with cardiology Rate controlled.  

## 2023-03-12 NOTE — Assessment & Plan Note (Signed)
Continue calcium supplementation, increase to 1000mg daily.  Recommend Vitamin D 1000 units daily.  

## 2023-03-12 NOTE — Assessment & Plan Note (Addendum)
S/p palliative  radiation  Consider bisphosphonate after dental clearance is obtained.

## 2023-03-13 ENCOUNTER — Other Ambulatory Visit: Payer: Self-pay

## 2023-03-13 ENCOUNTER — Encounter: Payer: Self-pay | Admitting: Cardiovascular Disease

## 2023-03-13 ENCOUNTER — Other Ambulatory Visit (HOSPITAL_COMMUNITY): Payer: Self-pay

## 2023-03-13 ENCOUNTER — Telehealth: Payer: Self-pay

## 2023-03-13 ENCOUNTER — Ambulatory Visit: Payer: BC Managed Care – PPO | Attending: Cardiovascular Disease | Admitting: Cardiovascular Disease

## 2023-03-13 VITALS — BP 88/70 | HR 85 | Ht 75.0 in | Wt 286.1 lb

## 2023-03-13 DIAGNOSIS — Z7901 Long term (current) use of anticoagulants: Secondary | ICD-10-CM

## 2023-03-13 DIAGNOSIS — C221 Intrahepatic bile duct carcinoma: Secondary | ICD-10-CM | POA: Diagnosis not present

## 2023-03-13 DIAGNOSIS — I5031 Acute diastolic (congestive) heart failure: Secondary | ICD-10-CM

## 2023-03-13 DIAGNOSIS — I10 Essential (primary) hypertension: Secondary | ICD-10-CM | POA: Diagnosis not present

## 2023-03-13 DIAGNOSIS — I4819 Other persistent atrial fibrillation: Secondary | ICD-10-CM

## 2023-03-13 DIAGNOSIS — I82411 Acute embolism and thrombosis of right femoral vein: Secondary | ICD-10-CM | POA: Diagnosis not present

## 2023-03-13 DIAGNOSIS — I4892 Unspecified atrial flutter: Secondary | ICD-10-CM | POA: Diagnosis not present

## 2023-03-13 DIAGNOSIS — E7800 Pure hypercholesterolemia, unspecified: Secondary | ICD-10-CM

## 2023-03-13 MED ORDER — TORSEMIDE 20 MG PO TABS: 20.0000 mg | ORAL_TABLET | Freq: Two times a day (BID) | ORAL | 3 refills | Status: DC | PRN

## 2023-03-13 MED ORDER — METOPROLOL TARTRATE 75 MG PO TABS: 75.0000 mg | ORAL_TABLET | Freq: Two times a day (BID) | ORAL | 6 refills | Status: DC

## 2023-03-13 MED ORDER — FUTIBATINIB (16 MG DAILY DOSE) 4 MG PO TBPK
16.0000 mg | ORAL_TABLET | Freq: Every day | ORAL | 0 refills | Status: DC
  Filled 2023-03-13: qty 112, 28d supply, fill #0
  Filled 2023-03-21: qty 56, 14d supply, fill #0
  Filled 2023-04-02: qty 56, 14d supply, fill #1

## 2023-03-13 NOTE — Addendum Note (Signed)
Addended by: [name] on: 03/13/2023 08:32 AM   Modules accepted: Orders

## 2023-03-13 NOTE — Telephone Encounter (Signed)
Please schedule lab only- early next week.

## 2023-03-13 NOTE — Telephone Encounter (Signed)
Spoke to pt. He confirmed that he got the message. Informed him of lab appt details.

## 2023-03-13 NOTE — Telephone Encounter (Signed)
-----   Message from [name], MD sent at 03/12/2023  7:55 PM EDT ----- Please let patient know that his phosphorus is elevated, this is a very common side effect of his chemotherapy.  Recommend him to stop his nutrition supplementation if he is on.  Recommend him to take calcium carbonate 500mg  three times daily. [ He is currently on 2 tabs daily]  Recommend low phosphorus diet- refer to [facility] for further education.  Please arrange him to repeat phosphorus early next week.

## 2023-03-13 NOTE — Patient Instructions (Addendum)
Cardioversion for atrial flutter next week  You are scheduled for a Cardioversion on Friday May 24th with Dr. Mariah Milling.    Please arrive at the [facility] Entrance of [facility], [address], [place] [address] at 6:30 am (This is 1 hour prior to your procedure time).  Proceed to the Check-In Desk directly inside the entrance.  Procedure Parking: Use the entrance off of the [address] side of the hospital. Turn right upon entering and follow the driveway to parking that is directly in front of the [facility] Vascular Center. There is no valet parking available at this entrance, however there is an awning directly in front of the [facility] Vascular Center for drop off/ pick up for patients  DIET: Nothing to eat or drink after midnight except a sip of water with medications (see medication instructions below)  Medication Instructions: Hold metFORMIN (GLUCOPHAGE) 500 MG tablet and glipiZIDE (GLUCOTROL) 10 MG tablet the morning of  Continue your anticoagulant: enoxaparin (LOVENOX) 150 MG/ML injection If you miss a dose, please call us at [phone] You will need to continue your anticoagulant after your procedure until you are told by your provider that it is safe to stop.   Labs: If patient is on Coumadin, patient needs pt/INR, CBC, BMET within 3 days (No pt/INR needed for patients taking Xarelto, Eliquis, Pradaxa) For patients receiving anesthesia for TEE and all Cardioversion patients: BMET, CBC within 30 days  FYI: For your safety, and to allow [place] to monitor your vital signs accurately during the surgery/procedure we request that if you have artificial nails, gel coating, SNS etc. Please have those removed prior to your surgery/procedure. Not having the nail coverings /polish removed may result in cancellation or delay of your surgery/procedure.  You must have a responsible person to drive you home and stay in the waiting area during your procedure. Failure to do so could result  in cancellation.  Bring your insurance cards.  If you have any questions after you get home, please call the office at [phone]  *Special Note: Every effort is made to have your procedure done on time. Occasionally there are emergencies that occur at the hospital that may cause delays. Please be patient if a delay does occur.          Medication Instructions:  Stop torsemide up pressure >110 and diarrhea resolved, Likely hold until weekend  Probiotics  Stay on metoprolol twice a day   If you need a refill on your cardiac medications before your next appointment, please call your pharmacy.   Lab work: No new labs needed  Testing/Procedures: No new testing needed  Follow-Up: At [facility], you and your health needs are our priority.  As part of our continuing mission to provide you with exceptional heart care, we have created designated Provider Care Teams.  These Care Teams include your primary Cardiologist (physician) and Advanced Practice Providers (APPs -  Physician Assistants and Nurse Practitioners) who all work together to provide you with the care you need, when you need it.  You will need a follow up appointment in 6 weeks   Providers on your designated Care Team:   Nicolasa Ducking, NP Eula Listen, PA-C Cadence Fransico Michael, [place]  COVID-19 Vaccine Information can be found at: [email or web address] For questions related to vaccine distribution or appointments, please email [email or web address] or call [phone].

## 2023-03-14 ENCOUNTER — Other Ambulatory Visit: Payer: Self-pay

## 2023-03-19 ENCOUNTER — Inpatient Hospital Stay: Payer: BC Managed Care – PPO

## 2023-03-19 DIAGNOSIS — C221 Intrahepatic bile duct carcinoma: Secondary | ICD-10-CM | POA: Diagnosis not present

## 2023-03-19 DIAGNOSIS — D6481 Anemia due to antineoplastic chemotherapy: Secondary | ICD-10-CM | POA: Diagnosis not present

## 2023-03-19 DIAGNOSIS — K5900 Constipation, unspecified: Secondary | ICD-10-CM | POA: Diagnosis not present

## 2023-03-19 DIAGNOSIS — D6861 Antiphospholipid syndrome: Secondary | ICD-10-CM | POA: Diagnosis not present

## 2023-03-19 DIAGNOSIS — I429 Cardiomyopathy, unspecified: Secondary | ICD-10-CM | POA: Diagnosis not present

## 2023-03-19 DIAGNOSIS — R978 Other abnormal tumor markers: Secondary | ICD-10-CM | POA: Diagnosis not present

## 2023-03-19 DIAGNOSIS — I4891 Unspecified atrial fibrillation: Secondary | ICD-10-CM | POA: Diagnosis not present

## 2023-03-19 DIAGNOSIS — C7951 Secondary malignant neoplasm of bone: Secondary | ICD-10-CM | POA: Diagnosis not present

## 2023-03-19 LAB — PHOSPHORUS: Phosphorus: 6.5 mg/dL — ABNORMAL HIGH (ref 2.5–4.6)

## 2023-03-21 ENCOUNTER — Other Ambulatory Visit (HOSPITAL_COMMUNITY): Payer: Self-pay

## 2023-03-21 ENCOUNTER — Other Ambulatory Visit: Payer: Self-pay | Admitting: Cardiovascular Disease

## 2023-03-21 DIAGNOSIS — I4819 Other persistent atrial fibrillation: Secondary | ICD-10-CM

## 2023-03-21 MED ORDER — SODIUM CHLORIDE 0.9 % IV SOLN: INTRAVENOUS | Status: DC

## 2023-03-22 ENCOUNTER — Ambulatory Visit: Payer: BC Managed Care – PPO | Admitting: Certified Registered"

## 2023-03-22 ENCOUNTER — Encounter: Payer: Self-pay | Admitting: Cardiovascular Disease

## 2023-03-22 ENCOUNTER — Ambulatory Visit
Admission: RE | Admit: 2023-03-22 | Discharge: 2023-03-22 | Disposition: A | Discharge status: Home / Self Care | Payer: BC Managed Care – PPO | Attending: Cardiovascular Disease | Admitting: Cardiovascular Disease

## 2023-03-22 ENCOUNTER — Other Ambulatory Visit: Payer: Self-pay

## 2023-03-22 ENCOUNTER — Other Ambulatory Visit: Payer: Self-pay | Admitting: Cardiovascular Disease

## 2023-03-22 ENCOUNTER — Encounter
Admission: RE | Disposition: A | Discharge status: Home / Self Care | Payer: Self-pay | Source: Home / Self Care | Attending: Cardiovascular Disease

## 2023-03-22 ENCOUNTER — Other Ambulatory Visit (HOSPITAL_COMMUNITY): Payer: Self-pay

## 2023-03-22 DIAGNOSIS — I890 Lymphedema, not elsewhere classified: Secondary | ICD-10-CM | POA: Insufficient documentation

## 2023-03-22 DIAGNOSIS — Z7901 Long term (current) use of anticoagulants: Secondary | ICD-10-CM | POA: Insufficient documentation

## 2023-03-22 DIAGNOSIS — D6861 Antiphospholipid syndrome: Secondary | ICD-10-CM | POA: Insufficient documentation

## 2023-03-22 DIAGNOSIS — I5032 Chronic diastolic (congestive) heart failure: Secondary | ICD-10-CM | POA: Diagnosis not present

## 2023-03-22 DIAGNOSIS — I483 Typical atrial flutter: Secondary | ICD-10-CM | POA: Diagnosis not present

## 2023-03-22 DIAGNOSIS — R197 Diarrhea, unspecified: Secondary | ICD-10-CM | POA: Diagnosis not present

## 2023-03-22 DIAGNOSIS — Z01810 Encounter for preprocedural cardiovascular examination: Secondary | ICD-10-CM | POA: Diagnosis not present

## 2023-03-22 DIAGNOSIS — I480 Paroxysmal atrial fibrillation: Secondary | ICD-10-CM | POA: Diagnosis not present

## 2023-03-22 DIAGNOSIS — I4891 Unspecified atrial fibrillation: Secondary | ICD-10-CM | POA: Diagnosis not present

## 2023-03-22 DIAGNOSIS — C7951 Secondary malignant neoplasm of bone: Secondary | ICD-10-CM | POA: Diagnosis not present

## 2023-03-22 DIAGNOSIS — I82411 Acute embolism and thrombosis of right femoral vein: Secondary | ICD-10-CM | POA: Insufficient documentation

## 2023-03-22 DIAGNOSIS — E042 Nontoxic multinodular goiter: Secondary | ICD-10-CM | POA: Insufficient documentation

## 2023-03-22 DIAGNOSIS — Z87891 Personal history of nicotine dependence: Secondary | ICD-10-CM | POA: Insufficient documentation

## 2023-03-22 DIAGNOSIS — E785 Hyperlipidemia, unspecified: Secondary | ICD-10-CM | POA: Diagnosis not present

## 2023-03-22 DIAGNOSIS — I4819 Other persistent atrial fibrillation: Secondary | ICD-10-CM

## 2023-03-22 DIAGNOSIS — I951 Orthostatic hypotension: Secondary | ICD-10-CM | POA: Diagnosis not present

## 2023-03-22 DIAGNOSIS — C221 Intrahepatic bile duct carcinoma: Secondary | ICD-10-CM | POA: Insufficient documentation

## 2023-03-22 DIAGNOSIS — E119 Type 2 diabetes mellitus without complications: Secondary | ICD-10-CM | POA: Insufficient documentation

## 2023-03-22 DIAGNOSIS — I110 Hypertensive heart disease with heart failure: Secondary | ICD-10-CM | POA: Insufficient documentation

## 2023-03-22 DIAGNOSIS — Z9861 Coronary angioplasty status: Secondary | ICD-10-CM | POA: Diagnosis not present

## 2023-03-22 DIAGNOSIS — D649 Anemia, unspecified: Secondary | ICD-10-CM | POA: Diagnosis not present

## 2023-03-22 DIAGNOSIS — Z79899 Other long term (current) drug therapy: Secondary | ICD-10-CM | POA: Insufficient documentation

## 2023-03-22 DIAGNOSIS — R0602 Shortness of breath: Secondary | ICD-10-CM | POA: Diagnosis present

## 2023-03-22 HISTORY — PX: CARDIOVERSION: SHX1299

## 2023-03-22 LAB — GLUCOSE, CAPILLARY: Glucose-Capillary: 114 mg/dL — ABNORMAL HIGH (ref 70–99)

## 2023-03-22 SURGERY — CARDIOVERSION
Anesthesia: General

## 2023-03-22 MED ORDER — PROPOFOL 10 MG/ML IV BOLUS
INTRAVENOUS | Status: DC | PRN
  Administered 2023-03-22: 30 mg via INTRAVENOUS
  Administered 2023-03-22: 50 mg via INTRAVENOUS

## 2023-03-22 MED ORDER — LIDOCAINE HCL (CARDIAC) PF 50 MG/5ML IV SOSY
PREFILLED_SYRINGE | INTRAVENOUS | Status: DC | PRN
  Administered 2023-03-22: 50 mg via INTRAVENOUS

## 2023-03-22 MED ORDER — POTASSIUM CHLORIDE ER 10 MEQ PO TBCR: 10.0000 meq | EXTENDED_RELEASE_TABLET | Freq: Every day | ORAL | 3 refills | Status: DC

## 2023-03-22 NOTE — CV Procedure (Signed)
Cardioversion procedure note For atrial flutter, typical  Procedure Details:  Consent: Risks of procedure as well as the alternatives and risks of each were explained to the (patient/caregiver).  Consent for procedure obtained.  Time Out: Verified patient identification, verified procedure, site/side was marked, verified correct patient position, special equipment/implants available, medications/allergies/relevent history reviewed, required imaging and test results available.  Performed  Patient placed on cardiac monitor, pulse oximetry, supplemental oxygen as necessary.   Sedation given: propofol IV, Dr. Randa Ngo Pacer pads placed anterior and posterior chest.   Cardioverted 2 time(s).   Cardioverted at  150J, 200J. Synchronized biphasic Converted to NSR   Evaluation: Findings: Post procedure EKG shows: NSR Complications: None Patient did tolerate procedure well.  Time Spent Directly with the Patient:  45 minutes   Dossie Arbour, M.D., Ph.D.

## 2023-03-22 NOTE — Anesthesia Preprocedure Evaluation (Addendum)
Anesthesia Evaluation  Patient identified by MRN, date of birth, ID band Patient awake    Reviewed: Allergy & Precautions, NPO status , Patient's Chart, lab work & pertinent test results  History of Anesthesia Complications Negative for: history of anesthetic complications  Airway Mallampati: III  TM Distance: <3 FB Neck ROM: full    Dental  (+) Chipped, Poor Dentition   Pulmonary shortness of breath, pneumonia, unresolved, former smoker   + rhonchi        Cardiovascular hypertension, +CHF  + dysrhythmias Atrial Fibrillation  Rhythm:irregular Rate:Tachycardia     Neuro/Psych negative neurological ROS  negative psych ROS   GI/Hepatic Neg liver ROS,GERD  Medicated and Controlled,,  Endo/Other  diabetes, Type 2    Renal/GU negative Renal ROS  negative genitourinary   Musculoskeletal  (+) Arthritis ,    Abdominal   Peds  Hematology negative hematology ROS (+)   Anesthesia Other Findings Patient is NPO appropriate and reports no nausea or vomiting today.  Past Medical History: No date: Antiphospholipid antibody syndrome (HCC) No date: Arthritis No date: Cancer (HCC) No date: Diabetes mellitus without complication (HCC) No date: DVT (deep venous thrombosis) (HCC)     Comment:  Lt leg No date: Dyspnea No date: Elevated lipids No date: Erythrocytosis No date: Hyperlipidemia No date: Hypertension No date: Lower extremity edema No date: Multinodular thyroid No date: Post-thrombotic syndrome  Past Surgical History: 09/23/2018: COLONOSCOPY WITH PROPOFOL; N/A     Comment:  Per Dr. Norma Fredrickson, polyp, repeat in 3 yrs  No date: EYE MUSCLE SURGERY 07/22/2017: JOINT REPLACEMENT; Left 12/24/2022: PORTA CATH INSERTION; N/A     Comment:  Procedure: PORTA CATH INSERTION;  Surgeon: Annice Needy,              MD;  Location: [facility] INVASIVE CV LAB;  Service:               Cardiovascular;  Laterality: N/A; 07/22/2017: TOTAL HIP  ARTHROPLASTY; Left     Comment:  Procedure: TOTAL HIP ARTHROPLASTY;  Surgeon: Donato Heinz, MD;  Location: [facility] ORS;  Service: Orthopedics;               Laterality: Left;  BMI    Body Mass Index: 35.50 kg/m      Reproductive/Obstetrics negative OB ROS                             Anesthesia Physical Anesthesia Plan  ASA: 3  Anesthesia Plan: General   Post-op Pain Management:    Induction: Intravenous  PONV Risk Score and Plan: Propofol infusion and TIVA  Airway Management Planned: Natural Airway and Nasal Cannula  Additional Equipment:   Intra-op Plan:   Post-operative Plan:   Informed Consent: I have reviewed the patients History and Physical, chart, labs and discussed the procedure including the risks, benefits and alternatives for the proposed anesthesia with the patient or authorized representative who has indicated his/her understanding and acceptance.     Dental Advisory Given  Plan Discussed with: Anesthesiologist, CRNA and Surgeon  Anesthesia Plan Comments: (Patient consented for risks of anesthesia including but not limited to:  - adverse reactions to medications - risk of airway placement if required - damage to eyes, teeth, lips or other oral mucosa - nerve damage due to positioning  - sore throat or hoarseness - Damage to heart, brain, nerves,  lungs, other parts of body or loss of life  Patient voiced understanding.)       Anesthesia Quick Evaluation

## 2023-03-22 NOTE — Transfer of Care (Signed)
Immediate Anesthesia Transfer of Care Note  Patient: Christopher Marsh  Procedure(s) Performed: CARDIOVERSION  Patient Location: [facility]  Anesthesia Type:General  Level of Consciousness: awake, alert , and oriented  Airway & Oxygen Therapy: Patient Spontanous Breathing  Post-op Assessment: Report given to RN and Post -op Vital signs reviewed and stable  Post vital signs: stable  Last Vitals:  Vitals Value Taken Time  BP 151/69 03/22/23 0751  Temp    Pulse 76 03/22/23 0753  Resp 21 03/22/23 0753  SpO2 95 % 03/22/23 0753  Vitals shown include unvalidated device data.  Last Pain:  Vitals:   03/22/23 0656  TempSrc: Oral  PainSc: 0-No pain         Complications: No notable events documented.

## 2023-03-23 NOTE — Anesthesia Postprocedure Evaluation (Signed)
Anesthesia Post Note  Patient: Christopher Marsh  Procedure(s) Performed: CARDIOVERSION  Patient location during evaluation: [facility] Anesthesia Type: General Level of consciousness: awake and alert Pain management: pain level controlled Vital Signs Assessment: post-procedure vital signs reviewed and stable Respiratory status: spontaneous breathing, nonlabored ventilation, respiratory function stable and patient connected to nasal cannula oxygen Cardiovascular status: blood pressure returned to baseline and stable Postop Assessment: no apparent nausea or vomiting Anesthetic complications: no   There were no known notable events for this encounter.   Last Vitals:  Vitals:   03/22/23 0815 03/22/23 0830  BP: (!) 146/82 (!) 157/89  Pulse: 86 83  Resp: 19 (!) 22  Temp:    SpO2: 95% 95%    Last Pain:  Vitals:   03/22/23 0830  TempSrc:   PainSc: 0-No pain                 Cleda Mccreedy [name]

## 2023-03-25 NOTE — H&P (Signed)
H&P Addendum, pre-cardioversion ° °Patient was seen and evaluated prior to -cardioversion procedure °Symptoms, prior testing details again confirmed with the patient °Patient examined, no significant change from prior exam °Lab work reviewed in detail personally by myself °Patient understands risk and benefit of the procedure,  °The risks (stroke, cardiac arrhythmias rarely resulting in the need for a temporary or permanent pacemaker, skin irritation or burns and complications associated with conscious sedation including aspiration, arrhythmia, respiratory failure and death), benefits (restoration of normal sinus rhythm) and alternatives of a direct current cardioversion were explained in detail °Patient willing to proceed. ° °Signed, °Tim Josephanthony Tindel, MD, Ph.D °[facility]  °

## 2023-03-25 NOTE — Interval H&P Note (Signed)
History and Physical Interval Note:  03/25/2023 9:24 AM  Christopher Marsh  has presented today for surgery, with the diagnosis of Cardioversion  Afib.  The various methods of treatment have been discussed with the patient and family. After consideration of risks, benefits and other options for treatment, the patient has consented to  Procedure(s): CARDIOVERSION (N/A) as a surgical intervention.  The patient's history has been reviewed, patient examined, no change in status, stable for surgery.  I have reviewed the patient's chart and labs.  Questions were answered to the patient's satisfaction.     Julien Nordmann

## 2023-03-26 ENCOUNTER — Other Ambulatory Visit (HOSPITAL_COMMUNITY): Payer: Self-pay

## 2023-03-26 ENCOUNTER — Other Ambulatory Visit: Payer: Self-pay

## 2023-03-27 ENCOUNTER — Other Ambulatory Visit: Payer: Self-pay | Admitting: Oncology

## 2023-03-27 ENCOUNTER — Inpatient Hospital Stay: Payer: BC Managed Care – PPO

## 2023-03-27 ENCOUNTER — Ambulatory Visit: Payer: BC Managed Care – PPO | Admitting: Oncology

## 2023-03-27 ENCOUNTER — Inpatient Hospital Stay (HOSPITAL_BASED_OUTPATIENT_CLINIC_OR_DEPARTMENT_OTHER): Payer: BC Managed Care – PPO | Admitting: Oncology

## 2023-03-27 ENCOUNTER — Other Ambulatory Visit: Payer: BC Managed Care – PPO

## 2023-03-27 ENCOUNTER — Encounter: Payer: Self-pay | Admitting: Oncology

## 2023-03-27 ENCOUNTER — Ambulatory Visit: Payer: BC Managed Care – PPO | Admitting: Radiation Oncology

## 2023-03-27 VITALS — BP 129/52 | HR 80 | Temp 97.5°F | Resp 18 | Wt 287.2 lb

## 2023-03-27 DIAGNOSIS — C221 Intrahepatic bile duct carcinoma: Secondary | ICD-10-CM

## 2023-03-27 DIAGNOSIS — C7951 Secondary malignant neoplasm of bone: Secondary | ICD-10-CM | POA: Diagnosis not present

## 2023-03-27 DIAGNOSIS — D6481 Anemia due to antineoplastic chemotherapy: Secondary | ICD-10-CM | POA: Diagnosis not present

## 2023-03-27 DIAGNOSIS — D6861 Antiphospholipid syndrome: Secondary | ICD-10-CM

## 2023-03-27 DIAGNOSIS — R978 Other abnormal tumor markers: Secondary | ICD-10-CM | POA: Diagnosis not present

## 2023-03-27 DIAGNOSIS — T451X5A Adverse effect of antineoplastic and immunosuppressive drugs, initial encounter: Secondary | ICD-10-CM

## 2023-03-27 DIAGNOSIS — I4819 Other persistent atrial fibrillation: Secondary | ICD-10-CM

## 2023-03-27 DIAGNOSIS — I429 Cardiomyopathy, unspecified: Secondary | ICD-10-CM | POA: Diagnosis not present

## 2023-03-27 DIAGNOSIS — K5900 Constipation, unspecified: Secondary | ICD-10-CM | POA: Diagnosis not present

## 2023-03-27 DIAGNOSIS — I4891 Unspecified atrial fibrillation: Secondary | ICD-10-CM | POA: Diagnosis not present

## 2023-03-27 DIAGNOSIS — K521 Toxic gastroenteritis and colitis: Secondary | ICD-10-CM | POA: Insufficient documentation

## 2023-03-27 LAB — CBC WITH DIFFERENTIAL (CANCER CENTER ONLY)
Abs Immature Granulocytes: 0.01 10*3/uL (ref 0.00–0.07)
Basophils Absolute: 0 10*3/uL (ref 0.0–0.1)
Basophils Relative: 1 %
Eosinophils Absolute: 0 10*3/uL (ref 0.0–0.5)
Eosinophils Relative: 1 %
HCT: 34.5 % — ABNORMAL LOW (ref 39.0–52.0)
Hemoglobin: 11.1 g/dL — ABNORMAL LOW (ref 13.0–17.0)
Immature Granulocytes: 0 %
Lymphocytes Relative: 11 %
Lymphs Abs: 0.5 10*3/uL — ABNORMAL LOW (ref 0.7–4.0)
MCH: 30 pg (ref 26.0–34.0)
MCHC: 32.2 g/dL (ref 30.0–36.0)
MCV: 93.2 fL (ref 80.0–100.0)
Monocytes Absolute: 0.6 10*3/uL (ref 0.1–1.0)
Monocytes Relative: 14 %
Neutro Abs: 3.1 10*3/uL (ref 1.7–7.7)
Neutrophils Relative %: 73 %
Platelet Count: 169 10*3/uL (ref 150–400)
RBC: 3.7 MIL/uL — ABNORMAL LOW (ref 4.22–5.81)
RDW: 13.8 % (ref 11.5–15.5)
WBC Count: 4.2 10*3/uL (ref 4.0–10.5)
nRBC: 0 % (ref 0.0–0.2)

## 2023-03-27 LAB — PHOSPHORUS: Phosphorus: 5.6 mg/dL — ABNORMAL HIGH (ref 2.5–4.6)

## 2023-03-27 LAB — CMP (CANCER CENTER ONLY)
ALT: 10 U/L (ref 0–44)
AST: 25 U/L (ref 15–41)
Albumin: 3.1 g/dL — ABNORMAL LOW (ref 3.5–5.0)
Alkaline Phosphatase: 51 U/L (ref 38–126)
Anion gap: 11 (ref 5–15)
BUN: 7 mg/dL — ABNORMAL LOW (ref 8–23)
CO2: 21 mmol/L — ABNORMAL LOW (ref 22–32)
Calcium: 8.6 mg/dL — ABNORMAL LOW (ref 8.9–10.3)
Chloride: 100 mmol/L (ref 98–111)
Creatinine: 0.82 mg/dL (ref 0.61–1.24)
GFR, Estimated: >60 mL/min (ref >60)
Glucose, Bld: 165 mg/dL — ABNORMAL HIGH (ref 70–99)
Potassium: 3.4 mmol/L — ABNORMAL LOW (ref 3.5–5.1)
Sodium: 132 mmol/L — ABNORMAL LOW (ref 135–145)
Total Bilirubin: 0.2 mg/dL — ABNORMAL LOW (ref 0.3–1.2)
Total Protein: 6.1 g/dL — ABNORMAL LOW (ref 6.5–8.1)

## 2023-03-27 LAB — MAGNESIUM: Magnesium: 1.6 mg/dL — ABNORMAL LOW (ref 1.7–2.4)

## 2023-03-27 MED ORDER — HEPARIN SOD (PORK) LOCK FLUSH 100 UNIT/ML IV SOLN
500.0000 [IU] | Freq: Once | INTRAVENOUS | Status: AC
  Administered 2023-03-27: 500 [IU] via INTRAVENOUS
  Filled 2023-03-27: qty 5

## 2023-03-27 MED ORDER — DIPHENOXYLATE-ATROPINE 2.5-0.025 MG PO TABS: 1.0000 | ORAL_TABLET | Freq: Four times a day (QID) | ORAL | 3 refills | Status: DC | PRN

## 2023-03-27 MED ORDER — MAGNESIUM SULFATE 2 GM/50ML IV SOLN
2.0000 g | Freq: Once | INTRAVENOUS | Status: AC
  Administered 2023-03-27: 2 g via INTRAVENOUS
  Filled 2023-03-27: qty 50

## 2023-03-27 MED ORDER — SODIUM CHLORIDE 0.9 % IV SOLN
INTRAVENOUS | Status: DC
  Filled 2023-03-27: qty 250

## 2023-03-27 MED ORDER — SODIUM CHLORIDE 0.9% FLUSH
10.0000 mL | Freq: Once | INTRAVENOUS | Status: AC
  Administered 2023-03-27: 10 mL via INTRAVENOUS
  Filled 2023-03-27: qty 10

## 2023-03-27 NOTE — Assessment & Plan Note (Addendum)
Continue calcium supplementation, currently on 1100 mg twice daily.  Calcium has improved.  Stable. Recommend Vitamin D 1000 units daily.

## 2023-03-27 NOTE — Assessment & Plan Note (Signed)
Continue slow Mag to twice daily IV magnesium 2 g x 1 today.

## 2023-03-27 NOTE — Progress Notes (Signed)
Nutrition Assessment   Reason for Assessment:  Low phosphorus diet education   ASSESSMENT:  62 year old male with cholangiocarcinoma.  Patient has started second line treatment of futibatinib.  Past medical history of antiphospholipid syndrome, DM, DVT, HLD, HTN, lower extremity edema.    Met with patient and wife.  Patient reports decreased appetite and diarrhea for the past 3 weeks. MD calling in lomotil for patient today.  Reports that he eats one meal a day mostly.  Works in [employer] and is on the road during the day.  Eats at home typically 4 nights a week.  Breakfast usually during the week in banana and pack of nabs. On the weekend will go out to eat and have eggs, bacon/sausage, grits.  Sometimes will have a late lunch consisting of vegetable plate or chicken salad with pasta salad.  If has late lunch will not eat evening meal.  Likes to snack on fruit but not eating much due to diarrhea.  Not current drinking oral nutrition supplement    Nutrition Focused Physical Exam:   Orbital Region: normal Buccal Region: mild Upper Arm Region: normal Thoracic and Lumbar Region: normal Temple Region: normal Clavicle Bone Region: normal Shoulder and Acromion Bone Region: mild Scapular Bone Region: normal Dorsal Hand: normal Patellar Region: normal Anterior Thigh Region: Lower extremity edema Posterior Calf Region: Lower extremity edema    Medications: calcium carbonate, KCL, glipizide, compazine, lomotil, metformin   Labs: Na 132, K 3.4, glucose 165, calcium 8.6, phosphorus 5.6, Mag 1.6 (supplemented today)   Anthropometrics:   Height: 75 inches Weight: 287 lb 3.2 oz (has been off fluid pill for about week) 311 lb 3.2 oz on 4/8 BMI: 35  8% weight loss in the last 2 months, concerning   Estimated Energy Needs  Kcals: 2860-3200 Protein: 143-160 g Fluid: > 2860 ml   NUTRITION DIAGNOSIS: Food and nutrition related knowledge deficit related to cancer treatment (elevated  phosphorus) as evidenced by diet education on low phosphorus diet.   INTERVENTION:  Educated patient on low phosphorus diet.  Handout provided to patient and wife.  Stressed importance of reading food labels for "phos" being added to foods as body will absorb 100% of this.   Concerned about nutritional status due to poor appetite, weight loss and diarrhea.   Discussed nepro as low phosphorus containing oral nutrition supplement.  Patient concerned about drinking supplement and diarrhea.  Will try lomotil first.  Discussed foods to choose with diarrhea.   Contact information provided    MONITORING, EVALUATION, GOAL: weight trends, intake   Next Visit: phone call Tuesday, June 18  Mouhamed Glassco B. Freida Busman, RD, LDN Registered Dietitian [phone]

## 2023-03-27 NOTE — Assessment & Plan Note (Signed)
Hemoglobin has improved.

## 2023-03-27 NOTE — Assessment & Plan Note (Addendum)
Cannot tolerate Imodium.  Due to lower extremity cramps. Recommend Lomotil QID, stop Lactulose He will return to clinic next week for reassessment.

## 2023-03-27 NOTE — Assessment & Plan Note (Signed)
S/p palliative  radiation  Consider bisphosphonate after dental clearance is obtained.  

## 2023-03-27 NOTE — Assessment & Plan Note (Addendum)
Side effects from Futibatinib.  calcium carbonate 1100 mg to twice daily. Recommend low phosphorus diet- refer to nutritionist for further education.  Phosphorus level has decreased to 5.6.

## 2023-03-27 NOTE — Assessment & Plan Note (Signed)
And cardiomyopathy, follow up with cardiology Rate controlled. S/p ablation on 03/26/23

## 2023-03-27 NOTE — Progress Notes (Signed)
Hematology/Oncology Progress note [phone] [phone]    CHIEF COMPLAINTS/REASON FOR VISIT:  Follow up for right lower extremity DVT, antiphospholipid syndrome, metastatic cholangiocarcinoma.  ASSESSMENT & PLAN:   Cancer Staging  Cholangiocarcinoma [facility]) Staging form: Intrahepatic Bile Duct, AJCC 8th Edition - Clinical stage from 08/27/2022: Stage IV (cT2, cN1, cM1) - Signed by [name], MD on 09/13/2022   Cholangiocarcinoma (HCC) Cholangiocarcinoma, NGS-Tempus liquid biopsy FGFR2 -ADK mutation, TMB 0,  S/p Gemcitabine, Cisplatin and Durvalumab x 6 , 01/2023 CT progression/enlarged liver lesions --. 03/03/23 2nd line Futibatinib 20mg  daily Overall he tolerates well.  Dose reduce to 16mg  daily due to hyperphosphatemia.  Continue. Recommend patient to see ophthalmologist for his eye complaints.    Antiphospholipid syndrome (HCC) #Recurrent lower extremity DVT, secondary to antiphospholipid syndrome.-. Need  life time anticoagulation Continue  Lovenox 1 mg/kg twice daily, as of today, recommend 130mg  BID as he has lost weight.  Currently his platelet count is stable, off cytoreductive chemotherapy. Recommend patient to switch to Coumadin with INR goal of 2-3.  Referral to Coumadin clinic.  He agrees with the plan.  Hypocalcemia Continue calcium supplementation, currently on 1100 mg twice daily.  Calcium has improved.  Stable. Recommend Vitamin D 1000 units daily.   Metastasis to bone [facility]) S/p palliative  radiation  Consider bisphosphonate after dental clearance is obtained.   Atrial fibrillation (HCC) And cardiomyopathy, follow up with cardiology Rate controlled. S/p ablation on 03/26/23   Hyperphosphatemia Side effects from Futibatinib.  calcium carbonate 1100 mg to twice daily. Recommend low phosphorus diet- refer to nutritionist for further education.  Phosphorus level has decreased to 5.6.  Hypomagnesemia Continue slow Mag to twice daily IV  magnesium 2 g x 1 today.   Antineoplastic chemotherapy induced anemia Hemoglobin has improved.    Chemotherapy induced diarrhea Cannot tolerate Imodium.  Due to lower extremity cramps. Recommend Lomotil QID, stop Lactulose He will return to clinic next week for reassessment.    Orders Placed This Encounter  Procedures   Magnesium    Standing Status:   Future    Standing Expiration Date:   03/26/2024   Phosphorus    Standing Status:   Future    Standing Expiration Date:   03/26/2024   CBC with Differential (Cancer Center Only)    Standing Status:   Future    Standing Expiration Date:   03/26/2024   CMP (Cancer Center only)    Standing Status:   Future    Standing Expiration Date:   03/26/2024   Cancer antigen 19-9    Standing Status:   Future    Standing Expiration Date:   03/26/2024   CEA    Standing Status:   Future    Standing Expiration Date:   03/26/2024   Ambulatory referral to Cardiology    Referral Priority:   Routine    Referral Type:   Consultation    Referral Reason:   Specialty Services Required    Number of Visits Requested:   1   Follow up 2 weeks  All questions were answered. The patient knows to call the clinic with any problems, questions or concerns.  [name], MD, PhD [facility] Hematology Oncology 03/27/2023    HISTORY OF PRESENTING ILLNESS:  Patient reports remote history of left lower extremity DVT in 2002.  He was initiated on Lovenox and bridged to Coumadin.  Patient took warfarin for 2 years before anticoagulation was stopped. 12/03/2021, patient presented to emergency room for evaluation of right lower  extremity pain and swelling for about a week.  Started on the inner side of right thigh and migrated to the right calf. + Associated with swelling.  Denies any recent injury, hospitalization, surgery.  He first noticed the symptoms after playing basketball with his grandson.   12/03/2021, right lower extremity ultrasound showed occlusive DVT extending  from the mid aspect of the femoral vein through the imaged tibial vein.  Age-indeterminate. Patient was started on Xarelto. He was referred to establish care with vascular surgeon and was seen by Sheppard Plumber on 12/06/2021.  Shared decision was made not to proceed with embolectomy.  Continue anticoagulation. Patient was referred to hematology oncology for further evaluation.  Patient denies any family history of blood clots.  Denies any unintentional weight loss, fever or night sweats. 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  He sometimes stay in his car while waiting for next assessment appointment.  He reports the right lower extremity symptom has improved since the start of Xarelto.  No active bleeding events.  #12/18/2021 hypercoagulable work-up showed JAK2 V617F mutation negative, with reflex to other mutations CALR, MPL, JAK 2 Ex 12-15 mutations negative, negative anticardiolipin IgG and IgM antibodies, positive lupus anticoagulant, negative factor V Leiden mutation, negative prothrombin gene mutation, normal protein C activity, normal protein S antigen level. Patient was recommended to switch to Coumadin with INR goal of 2-3.  #03/15/2022, repeat lupus anticoagulant is persistently positive- + antiphospholipid syndrome.  Patient is currently on Lovenox 1 mg/kg twice daily.  admitted due to multifocal pneumonia, treated with Influenza panel negative, COVID-negative, HIV negative, Complete course of antibiotics with Zithromax, Vantin   + HCV antibody positive, HCV RNA quantification not detected. HCV RT PCR not detected.   INTERVAL HISTORY Christopher Marsh is a 62 y.o. male who has above history reviewed by me today presents for follow up visit for management of right lower extremity DVT and antiphospholipid syndrome, metastatic cholangiocarcinoma.  Oncology History  Cholangiocarcinoma (HCC)   10/30/2021 Imaging   CT chest wo contrast 1. Heterogeneous bilateral ground-glass disease, multilobar but worst in the right middle lobe, lingula and lower lobes. There is progression of the abnormality since 10/12/2022. Though pneumonia could produce this appearance, given persistence and fluctuating appearance since October, consider alternative entities such as hypersensitivity pneumonitis or idiopathic interstitial pneumonia. 2. Slightly enlarged pulmonary trunk, possible arterial hypertension 3. Redemonstrated large hypodense right hepatic lobe mass.   08/18/2022 Imaging   CT chest w contrast showed 1. Multifocal airspace disease in both lungs, left side greater than right. Findings are most compatible with multifocal pneumonia. 2. Multiple new hepatic lesions with enlarging lymph nodes in the upper abdomen and chest. Findings are concerning for metastatic disease. Based on the multifocal pneumonia, hepatic abscesses would also be in the differential diagnosis. Recommend further characterization of the abdomen and pelvis with CT with IV contrast. 3. Enlargement of the main pulmonary artery could be associated with pulmonary hypertension. 4. Coronary artery calcifications. 5. Multinodular goiter. Patient has known thyroid nodules and previous thyroid ultrasound.    08/18/2022 Imaging   CT abdomen pelvis w contrast  Interval development of 6.5 x 5.2 cm heterogeneously enhancing mass in right hepatic lobe. This is highly concerning for neoplasm or malignancy, and further evaluation with MRI is recommended.   Mildly enlarged periaortic adenopathy is noted, with the largest lymph node measuring 12 mm. Metastatic disease cannot be excluded. Mildly enlarged adenopathy  is also noted in the gastrohepatic ligament, but this is unchanged compared to prior exam.   Stable bibasilar lung opacities are noted concerning for inflammation, atelectasis or possibly scarring.   Grossly stable multi-septated  cystic lesion is seen involving upper pole of right kidney with peripheral calcifications compared to prior exam of 2019, most likely representing benign etiology.   Aortic Atherosclerosis    08/18/2022 - 08/21/2022 Hospital Admission   Admitted due to multifocal pneumonia, treated with Influenza panel negative, COVID-negative, HIV negative, Complete course of antibiotics with Zithromax, Vantin     08/19/2022 Imaging   MR abdomen w wo contrast  Marked caudate lobe hypertrophy, highly suspicious for hepatic cirrhosis.   Numerous hypervascular masses throughout the right hepatic lobe, highly suspicious for multifocal hepatocellular carcinoma. Recommend correlation with AFP and consider tissue sampling.   Mild abdominal lymphadenopathy, with differential diagnosis including metastatic disease and reactive lymphadenopathy in setting of cirrhosis.   Bilateral lower lobe infiltrates, as better demonstrated on recent CT.    08/19/2022 Tumor Marker   AFP 4.1   08/27/2022 Initial Diagnosis   Cholangiocarcinoma  -08/20/22 Liver mass biopsy showed poorly differentiated carcinoma Immunohistochemical stains show that the poorly differentiated carcinoma is positive for CK7 and MOC-31, suggestive of a poorly differentiated adenocarcinoma.  Immunohistochemical stains for CK20, CDX2, HepPar 1 and arginase are negative.  Immunostain for glypican-3 shows very focal  likely nonspecific labeling.  This immunoprofile is nonspecific but can be compatible with a poorly differentiated primary cholangiocarcinoma in absence of any other lesions.   NGS showed BAP 1 mutation.  Case was presented at tumor board, I have also discussed with pathologist Dr.Rubinas,  IHC pattern is more consistent with adenocarcinoma, unlikely HCC [due to negative HepPar 1 and arginase], cholangiocarcinoma is favored, although this is a diagnosis of exclusion. Second opinion ar [facility]   08/27/2022 Cancer Staging   Staging form:  Intrahepatic Bile Duct, AJCC 8th Edition - Clinical stage from 08/27/2022: Stage IV (cT2, cN1, cM1) - Signed by [name], MD on 09/13/2022 Stage prefix: Initial diagnosis   08/27/2022 Tumor Marker   CA 19.9  16 CEA 2.8   09/12/2022 Imaging   PET scan showed 1. Large right hepatic lobe mass is hypermetabolic and consistent with known cholangiocarcinoma. 2. Scattered borderline enlarged upper abdominal lymph nodes do not show any significant FDG uptake. 3. 4 hypermetabolic bone lesions consistent with metastatic disease. 4. Progressive diffuse airspace process in the left lung could be due to atypical pneumonia, pulmonary hemorrhage or left-sided aspiration. 5. Aortic atherosclerosis.  09/12/2022 I had a phone discussion with patient after his PET scan resulted.  Recommend systemic chemotherapy.  Patient would like to defer until his second opinion visit at [facility].   09/15/2022 Imaging   CT angio chest pulmonary embolism protocol showed 1. No evidence for pulmonary embolism. 2. Progression of bilateral multifocal patchy ground-glass and interstitial opacities, left greater than right. Findings are concerning for multifocal pneumonia. 3. Air-fluid levels in the left lower lobe bronchi worrisome for aspiration. 4. Mediastinal and hilar lymphadenopathy has increased in size and number when compared to the prior study, likely reactive.5. Cardiomegaly. 6. Findings compatible with pulmonary artery hypertension.7. Stable right liver mass and complex right renal cystic lesion. 8. Stable enlargement of the left thyroid.   09/15/2022 - 09/17/2022 Hospital Admission   Patient was admitted due to shortness of breath, CT showed progression of bilateral multifocal patchy groundglass and interstitial opacities.  Left greater than right.  Concerning for multifocal pneumonia.  Patient  was started on broad-spectrum antibiotics with significant improvement.  He was discharged on oral antibiotics.   09/19/2022  Miscellaneous   Patient went to [facility] for second opinion.  He was seen by Shirlee Latch, who felt that the presentation is consistent with metastatic cholangiocarcinoma, and agrees with the plan of cisplatin/gemcitabine/durvalumab..    10/11/2022 -  Chemotherapy   Patient is on Treatment Plan : Cisplatin D1,8 + Gemcitabine + Durvalumab  D1,8 q21d x     10/12/2022 Imaging   CT chest w contrast 1. Bilateral airspace and ground-glass opacities have mildly decreased in amount and density when compared to the prior study. Findings are compatible with resolving multifocal pneumonia. Continued follow-up imaging recommended. 2. Stable enlarged heterogeneous left thyroid gland with 3.2 cm nodule. This can be further evaluated with dedicated ultrasound. 3. Stable hepatic and right renal lesions, incompletely evaluated.   Aortic Atherosclerosis (ICD10-I70.0).   12/11/2022 Imaging   PET showed  1. There is been mild increase in size of the tracer avid mass within right lobe of liver compatible with known cholangiocarcinoma. The degree of tracer uptake however is slightly decreased in the interval. 2. Multifocal tracer avid bone metastases are again noted. When compared with the previous exam there has been an mixed interval response to therapy. Specifically, there has been interval increase in size and FDG uptake associated with the right ischial tuberosity lesion and there is a new lesion within the posterior left iliac bone. Decreased tracer uptake is associated with the left scapular lesion and there is stable tracer uptake associated with the right T1 transverse process lesion. 3. Persistent ground-glass and airspace densities within the left upper lobe and left lower lobe compatible with inflammatory or infectious process. Interval resolution of previous right lung ground-glass and airspace densities.    12/24/2022 Procedure   Medi port placement by Dr. Wyn Quaker   01/22/2023 Imaging   CT chest abdomen  pelvis with contrast 1. Hypodense lesion of the right lobe of the liver, hepatic segment VII/VIII is slightly diminished in size, consistent with treatment response. 2. No evident change in additional smaller hypodense lesions scattered throughout the right lobe of the liver, these more difficult to see on prior noncontrast PET-CT. 3. Unchanged enlarged celiac axis, gastrohepatic ligament, and retroperitoneal lymph nodes. Unchanged prominent high right paratracheal lymph nodes. 4. Unchanged osseous metastases. 5. Slightly improved, although still extensive ground-glass airspace opacity primarily seen throughout the left lower lobe and lingula,nonspecific and infectious or inflammatory. 6. Coronary artery disease. 7. Emphysema.   02/11/2023 Imaging   Patient self decreased Lovenox dosage to 120mg  once daily.  Increased lower extremity swelling.   02/11/23 [place] bilateral lower extremity showed 1. Nonocclusive DVT within the popliteal veins bilaterally. 2. Occlusive DVT within the posterior tibial veins of the right calf.       02/13/2023 Imaging   CT chest Angiogram  1. Technically adequate exam showing no acute pulmonary embolus. 2. Cardiomegaly. Coronary artery calcifications. 3. Bilateral pleural effusions. 4. Improved aeration of the RIGHT UPPER and LOWER lobes compared to prior study. 5. LEFT LOWER lobe and lingula airspace filling and atelectasis, slightly improved compared to prior study. 6. Cirrhotic morphology of the liver. 7.7 centimeters mass in the RIGHT hepatic lobe. 7. Gastrohepatic ligament adenopathy. 8. A 7 millimeters sclerotic lesion in the UPPER sternum is suspicious for metastatic disease. 9. LEFT thyroid goiter.   02/13/2023 - 02/15/2023 Hospital Admission   A flutter with variable A-V block. Heart rate up to 140.  BNP 119.8 04/18:  Echo no overt CHF - EF 50-55  Cardiology recs beta blocker to metoprolol succinate 50 mg bid, Torsemid 40mg  daily Adjusting Lovenox  14mg  BID.     INTERVAL HISTORY MARIS HASTON is a 62 y.o. male who has above history reviewed by me today presents for follow up visit for  Cholangiocarcinoma, antiphospholipid syndrome, thrombosis He tolerated  Futibatinib chemotherapy.  Reports diarrhea since last visit. He has stopped using bowel regimen.  He has taken Imodium which he cannot tolerate due to leg cramps. He takes Lovenox 130mg  BID currently.  On Torsemide, Status post ablation for atrial fibrillation.  Tolerated procedure well.   Review of Systems  Constitutional:  Positive for fatigue. Negative for appetite change, chills and fever.  HENT:   Negative for hearing loss and voice change.   Eyes:  Positive for eye problems.  Respiratory:  Negative for chest tightness, cough and shortness of breath.   Cardiovascular:  Positive for leg swelling. Negative for chest pain.  Gastrointestinal:  Positive for diarrhea. Negative for abdominal distention, abdominal pain and blood in stool.  Endocrine: Negative for hot flashes.  Genitourinary:  Negative for difficulty urinating and frequency.   Musculoskeletal:  Negative for arthralgias.  Skin:  Negative for itching and rash.  Neurological:  Negative for extremity weakness.  Hematological:  Negative for adenopathy. Bruises/bleeds easily.  Psychiatric/Behavioral:  Negative for confusion.     MEDICAL HISTORY:  Past Medical History:  Diagnosis Date   Antiphospholipid antibody syndrome (HCC)    Arthritis    Cancer (HCC)    Diabetes mellitus without complication (HCC)    DVT (deep venous thrombosis) (HCC)    Lt leg   Dyspnea    Elevated lipids    Erythrocytosis    Hyperlipidemia    Hypertension    Lower extremity edema    Multinodular thyroid    Post-thrombotic syndrome     SURGICAL HISTORY: Past Surgical History:  Procedure Laterality Date   CARDIOVERSION N/A 03/22/2023   Procedure: CARDIOVERSION;  Surgeon: Antonieta Iba, MD;  Location: [facility] ORS;  Service:  Cardiovascular;  Laterality: N/A;   COLONOSCOPY WITH PROPOFOL N/A 09/23/2018   Per Dr. Norma Fredrickson, polyp, repeat in 3 yrs    EYE MUSCLE SURGERY     JOINT REPLACEMENT Left 07/22/2017   PORTA CATH INSERTION N/A 12/24/2022   Procedure: PORTA CATH INSERTION;  Surgeon: Annice Needy, MD;  Location: [facility] INVASIVE CV LAB;  Service: Cardiovascular;  Laterality: N/A;   TOTAL HIP ARTHROPLASTY Left 07/22/2017   Procedure: TOTAL HIP ARTHROPLASTY;  Surgeon: Donato Heinz, MD;  Location: [facility] ORS;  Service: Orthopedics;  Laterality: Left;    SOCIAL HISTORY: Social History   Socioeconomic History   Marital status: Married    Spouse name: Not on file   Number of children: Not on file   Years of education: Not on file   Highest education level: Not on file  Occupational History   Not on file  Tobacco Use   Smoking status: Former    Packs/day: 0.50    Years: 6.00    Additional pack years: 0.00    Total pack years: 3.00    Types: Cigarettes    Quit date: 50    Years since quitting: 42.4   Smokeless tobacco: Never   Tobacco comments:    Pt states when he did smoke he smoked at most 1 ppd. ALS 09/26/2022  Vaping Use   Vaping Use: Never used  Substance and Sexual Activity   Alcohol  use: Not Currently    Comment: occ   Drug use: No   Sexual activity: Not on file  Other Topics Concern   Not on file  Social History Narrative   Not on file   Social Determinants of Health   Financial Resource Strain: Low Risk  (08/27/2022)   Overall Financial Resource Strain (CARDIA)    Difficulty of Paying Living Expenses: Not very hard  Food Insecurity: No Food Insecurity (02/18/2023)   Hunger Vital Sign    Worried About Running Out of Food in the Last Year: Never true    Ran Out of Food in the Last Year: Never true  Transportation Needs: No Transportation Needs (02/18/2023)   PRAPARE - Administrator, Civil Service (Medical): No    Lack of Transportation (Non-Medical): No  Physical Activity:  Insufficiently Active (08/27/2022)   Exercise Vital Sign    Days of Exercise per Week: 3 days    Minutes of Exercise per Session: 30 min  Stress: Stress Concern Present (08/27/2022)   [employer] of Occupational Health - Occupational Stress Questionnaire    Feeling of Stress : Rather much  Social Connections: Socially Integrated (08/27/2022)   Social Connection and Isolation Panel [NHANES]    Frequency of Communication with Friends and Family: More than three times a week    Frequency of Social Gatherings with Friends and Family: More than three times a week    Attends Religious Services: 1 to 4 times per year    Active Member of [employer] or Organizations: Yes    Attends Banker Meetings: Never    Marital Status: Married  Catering manager Violence: Not At Risk (02/14/2023)   Humiliation, Afraid, Rape, and Kick questionnaire    Fear of Current or Ex-Partner: No    Emotionally Abused: No    Physically Abused: No    Sexually Abused: No    FAMILY HISTORY: Family History  Problem Relation Age of Onset   Breast cancer Mother    Emphysema Father    Colon cancer Maternal Grandmother     ALLERGIES:  has No Known Allergies.  MEDICATIONS:  Current Outpatient Medications  Medication Sig Dispense Refill   albuterol (VENTOLIN HFA) 108 (90 Base) MCG/ACT inhaler TAKE 2 PUFFS BY MOUTH EVERY 4 HOURS AS NEEDED 8.5 each 1   Calcium Carbonate 500 MG CHEW Chew 2 tablets (1,000 mg total) by mouth daily. 60 tablet 0   diphenoxylate-atropine (LOMOTIL) 2.5-0.025 MG tablet Take 1 tablet by mouth 4 (four) times daily as needed for diarrhea or loose stools. 120 tablet 3   enoxaparin (LOVENOX) 150 MG/ML injection Inject 0.86 mLs (129 mg total) into the skin every 12 (twelve) hours. 60 mL 0   futibatinib, 16 mg daily dose, (LYTGOBI) 4 MG tablet Take 4 tablets (16 mg total) by mouth daily. 112 tablet 0   glipiZIDE (GLUCOTROL) 10 MG tablet Take 1 tablet (10 mg total) by mouth 2 (two) times  daily before a meal. 180 tablet 3   glucose blood test strip 1 each by Other route as needed for other. accu chek aviva plusUse as instructed     Lactulose 20 GM/30ML SOLN Take 30 mLs (20 g total) by mouth daily as needed (constipation.). 450 mL 0   lidocaine-prilocaine (EMLA) cream Apply 1 Application topically as needed. 30 g 5   LORazepam (ATIVAN) 0.5 MG tablet TAKE 1 TABLET (0.5 MG TOTAL) BY MOUTH EVERY 8 (EIGHT) HOURS AS NEEDED FOR ANXIETY (NAUSEA VOMITING). 60 tablet  0   magnesium chloride (SLOW-MAG) 64 MG TBEC SR tablet Take 1 tablet (64 mg total) by mouth 2 (two) times daily. 60 tablet 0   metFORMIN (GLUCOPHAGE) 500 MG tablet TAKE TWO TABLETS BY MOUTH EVERY MORNING WITH BREAKFAST 180 tablet 1   Metoprolol Tartrate 75 MG TABS Take 1 tablet (75 mg total) by mouth 2 (two) times daily. 60 tablet 6   MUCINEX D MAX STRENGTH [phone] MG TB12 Take 1,200 mg by mouth 2 (two) times daily as needed (for congestion or to loosen mucous in the chest).     ondansetron (ZOFRAN) 8 MG tablet Take 1 tablet (8 mg total) by mouth every 8 (eight) hours as needed for nausea or vomiting. Start on the third day after cisplatin. 90 tablet 1   potassium chloride (KLOR-CON) 10 MEQ tablet Take 1 tablet (10 mEq total) by mouth daily. 90 tablet 3   pravastatin (PRAVACHOL) 20 MG tablet Take 1 tablet (20 mg total) by mouth daily. 90 tablet 3   prochlorperazine (COMPAZINE) 10 MG tablet TAKE 1 TABLET (10 MG TOTAL) BY MOUTH EVERY 6 HOURS AS NEEDED FOR NAUSEA AND VOMITING 90 tablet 1   tadalafil (CIALIS) 20 MG tablet Take 0.5-1 tablets (10-20 mg total) by mouth every other day as needed for erectile dysfunction. 20 tablet 11   torsemide (DEMADEX) 20 MG tablet Take 1 tablet (20 mg total) by mouth 2 (two) times daily as needed. 180 tablet 3   triamcinolone cream (KENALOG) 0.1 % Apply 1 Application topically 3 (three) times daily. (Patient taking differently: Apply 1 Application topically 3 (three) times daily as needed (for  bilateral, below-the-knees skin irritation).) 453.6 g 1   dexamethasone (DECADRON) 4 MG tablet TAKE 2 TABLETS DAILY X 3 DAYS STARTING THE DAY AFTER CISPLATIN CHEMOTHERAPY. TAKE WITH FOOD. (Patient not taking: Reported on 02/20/2023) 30 tablet 1   No current facility-administered medications for this visit.   Facility-Administered Medications Ordered in Other Visits  Medication Dose Route Frequency Provider Last Rate Last Admin   0.9 %  sodium chloride infusion   Intravenous Continuous [name], MD   Stopped at 03/27/23 1606     PHYSICAL EXAMINATION: ECOG PERFORMANCE STATUS: 1 - Symptomatic but completely ambulatory Vitals:   03/27/23 1323  BP: (!) 129/52  Pulse: 80  Resp: 18  Temp: (!) 97.5 F (36.4 C)  SpO2: 96%   Filed Weights   03/27/23 1323  Weight: 287 lb 3.2 oz (130.3 kg)    Physical Exam Constitutional:      General: He is not in acute distress. HENT:     Head: Normocephalic and atraumatic.  Eyes:     General: No scleral icterus. Cardiovascular:     Rate and Rhythm: Normal rate.  Pulmonary:     Effort: Pulmonary effort is normal. No respiratory distress.     Breath sounds: No wheezing.  Abdominal:     General: Bowel sounds are normal. There is no distension.  Musculoskeletal:        General: Normal range of motion.     Cervical back: Normal range of motion and neck supple.     Right lower leg: Edema present.     Left lower leg: Edema present.  Skin:    General: Skin is warm and dry.     Findings: No erythema or rash.  Neurological:     Mental Status: He is alert and oriented to person, place, and time. Mental status is at baseline.     Cranial Nerves:  No cranial nerve deficit.  Psychiatric:        Mood and Affect: Mood normal.     LABORATORY DATA:  I have reviewed the data as listed    Latest Ref Rng & Units 03/27/2023   12:52 PM 03/12/2023    9:19 AM 02/19/2023    9:54 AM  CBC  WBC 4.0 - 10.5 K/uL 4.2  5.9  2.9   Hemoglobin 13.0 - 17.0 g/dL 16.1   09.6  9.9   Hematocrit 39.0 - 52.0 % 34.5  36.3  32.0   Platelets 150 - 400 K/uL 169  170  385       Latest Ref Rng & Units 03/27/2023   12:52 PM 03/12/2023    9:19 AM 02/19/2023    9:54 AM  CMP  Glucose 70 - 99 mg/dL 045  96  409   BUN 8 - 23 mg/dL 7  9  9    Creatinine 0.61 - 1.24 mg/dL 8.11  9.14  7.82   Sodium 135 - 145 mmol/L 132  136  135   Potassium 3.5 - 5.1 mmol/L 3.4  3.5  3.7   Chloride 98 - 111 mmol/L 100  100  99   CO2 22 - 32 mmol/L 21  23  23    Calcium 8.9 - 10.3 mg/dL 8.6  8.9  8.1   Total Protein 6.5 - 8.1 g/dL 6.1  6.0  6.4   Total Bilirubin 0.3 - 1.2 mg/dL 0.2  0.6  0.6   Alkaline Phos 38 - 126 U/L 51  48  49   AST 15 - 41 U/L 25  23  26    ALT 0 - 44 U/L 10  12  14      RADIOGRAPHIC STUDIES: I have personally reviewed the radiological images as listed and agreed with the findings in the report. No results found.

## 2023-03-27 NOTE — Assessment & Plan Note (Addendum)
#  Recurrent lower extremity DVT, secondary to antiphospholipid syndrome.-. Need  life time anticoagulation Continue  Lovenox 1 mg/kg twice daily, as of today, recommend 130mg  BID as he has lost weight.  Currently his platelet count is stable, off cytoreductive chemotherapy. Recommend patient to switch to Coumadin with INR goal of 2-3.  Referral to Coumadin clinic.  He agrees with the plan.

## 2023-03-27 NOTE — Assessment & Plan Note (Addendum)
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 03/03/23 2nd line Futibatinib 20mg  daily Overall he tolerates well.  Dose reduce to 16mg  daily due to hyperphosphatemia.  Continue. Recommend patient to see ophthalmologist for his eye complaints.

## 2023-03-28 ENCOUNTER — Other Ambulatory Visit: Payer: Self-pay | Admitting: Oncology

## 2023-03-28 DIAGNOSIS — Z96642 Presence of left artificial hip joint: Secondary | ICD-10-CM | POA: Diagnosis not present

## 2023-03-28 DIAGNOSIS — C221 Intrahepatic bile duct carcinoma: Secondary | ICD-10-CM | POA: Diagnosis not present

## 2023-03-28 DIAGNOSIS — C7951 Secondary malignant neoplasm of bone: Secondary | ICD-10-CM | POA: Diagnosis not present

## 2023-03-28 LAB — CEA: CEA: 5 ng/mL — ABNORMAL HIGH (ref 0.0–4.7)

## 2023-03-28 LAB — CANCER ANTIGEN 19-9: CA 19-9: 52 U/mL — ABNORMAL HIGH (ref 0–35)

## 2023-04-02 ENCOUNTER — Other Ambulatory Visit (HOSPITAL_COMMUNITY): Payer: Self-pay

## 2023-04-03 ENCOUNTER — Encounter: Payer: Self-pay | Admitting: Medical Oncology

## 2023-04-03 ENCOUNTER — Inpatient Hospital Stay: Payer: BC Managed Care – PPO

## 2023-04-03 ENCOUNTER — Other Ambulatory Visit: Payer: Self-pay

## 2023-04-03 ENCOUNTER — Other Ambulatory Visit (HOSPITAL_COMMUNITY): Payer: Self-pay

## 2023-04-03 ENCOUNTER — Inpatient Hospital Stay (HOSPITAL_BASED_OUTPATIENT_CLINIC_OR_DEPARTMENT_OTHER): Payer: BC Managed Care – PPO | Admitting: Medical Oncology

## 2023-04-03 ENCOUNTER — Inpatient Hospital Stay: Payer: BC Managed Care – PPO | Attending: Oncology

## 2023-04-03 VITALS — BP 117/56 | HR 68 | Temp 98.0°F | Wt 284.0 lb

## 2023-04-03 DIAGNOSIS — Z7901 Long term (current) use of anticoagulants: Secondary | ICD-10-CM | POA: Diagnosis not present

## 2023-04-03 DIAGNOSIS — C7951 Secondary malignant neoplasm of bone: Secondary | ICD-10-CM | POA: Diagnosis not present

## 2023-04-03 DIAGNOSIS — D6861 Antiphospholipid syndrome: Secondary | ICD-10-CM

## 2023-04-03 DIAGNOSIS — E875 Hyperkalemia: Secondary | ICD-10-CM

## 2023-04-03 DIAGNOSIS — E042 Nontoxic multinodular goiter: Secondary | ICD-10-CM | POA: Insufficient documentation

## 2023-04-03 DIAGNOSIS — I2510 Atherosclerotic heart disease of native coronary artery without angina pectoris: Secondary | ICD-10-CM | POA: Insufficient documentation

## 2023-04-03 DIAGNOSIS — K521 Toxic gastroenteritis and colitis: Secondary | ICD-10-CM

## 2023-04-03 DIAGNOSIS — C221 Intrahepatic bile duct carcinoma: Secondary | ICD-10-CM

## 2023-04-03 DIAGNOSIS — I4891 Unspecified atrial fibrillation: Secondary | ICD-10-CM | POA: Insufficient documentation

## 2023-04-03 DIAGNOSIS — Z86718 Personal history of other venous thrombosis and embolism: Secondary | ICD-10-CM | POA: Diagnosis not present

## 2023-04-03 DIAGNOSIS — Z95828 Presence of other vascular implants and grafts: Secondary | ICD-10-CM

## 2023-04-03 DIAGNOSIS — Z452 Encounter for adjustment and management of vascular access device: Secondary | ICD-10-CM | POA: Insufficient documentation

## 2023-04-03 DIAGNOSIS — T451X5A Adverse effect of antineoplastic and immunosuppressive drugs, initial encounter: Secondary | ICD-10-CM

## 2023-04-03 LAB — CMP (CANCER CENTER ONLY)
ALT: 13 U/L (ref 0–44)
AST: 21 U/L (ref 15–41)
Albumin: 3.2 g/dL — ABNORMAL LOW (ref 3.5–5.0)
Alkaline Phosphatase: 54 U/L (ref 38–126)
Anion gap: 11 (ref 5–15)
BUN: 7 mg/dL — ABNORMAL LOW (ref 8–23)
CO2: 23 mmol/L (ref 22–32)
Calcium: 8.6 mg/dL — ABNORMAL LOW (ref 8.9–10.3)
Chloride: 100 mmol/L (ref 98–111)
Creatinine: 0.84 mg/dL (ref 0.61–1.24)
GFR, Estimated: >60 mL/min (ref >60)
Glucose, Bld: 165 mg/dL — ABNORMAL HIGH (ref 70–99)
Potassium: 3.7 mmol/L (ref 3.5–5.1)
Sodium: 134 mmol/L — ABNORMAL LOW (ref 135–145)
Total Bilirubin: 0.3 mg/dL (ref 0.3–1.2)
Total Protein: 6.1 g/dL — ABNORMAL LOW (ref 6.5–8.1)

## 2023-04-03 LAB — CBC WITH DIFFERENTIAL (CANCER CENTER ONLY)
Abs Immature Granulocytes: 0.01 10*3/uL (ref 0.00–0.07)
Basophils Absolute: 0 10*3/uL (ref 0.0–0.1)
Basophils Relative: 1 %
Eosinophils Absolute: 0 10*3/uL (ref 0.0–0.5)
Eosinophils Relative: 1 %
HCT: 36.1 % — ABNORMAL LOW (ref 39.0–52.0)
Hemoglobin: 11.3 g/dL — ABNORMAL LOW (ref 13.0–17.0)
Immature Granulocytes: 0 %
Lymphocytes Relative: 13 %
Lymphs Abs: 0.6 10*3/uL — ABNORMAL LOW (ref 0.7–4.0)
MCH: 29.4 pg (ref 26.0–34.0)
MCHC: 31.3 g/dL (ref 30.0–36.0)
MCV: 93.8 fL (ref 80.0–100.0)
Monocytes Absolute: 0.5 10*3/uL (ref 0.1–1.0)
Monocytes Relative: 12 %
Neutro Abs: 3.3 10*3/uL (ref 1.7–7.7)
Neutrophils Relative %: 73 %
Platelet Count: 195 10*3/uL (ref 150–400)
RBC: 3.85 MIL/uL — ABNORMAL LOW (ref 4.22–5.81)
RDW: 13.8 % (ref 11.5–15.5)
WBC Count: 4.4 10*3/uL (ref 4.0–10.5)
nRBC: 0 % (ref 0.0–0.2)

## 2023-04-03 LAB — PHOSPHORUS: Phosphorus: 5.6 mg/dL — ABNORMAL HIGH (ref 2.5–4.6)

## 2023-04-03 LAB — MAGNESIUM: Magnesium: 1.8 mg/dL (ref 1.7–2.4)

## 2023-04-03 MED ORDER — HEPARIN SOD (PORK) LOCK FLUSH 100 UNIT/ML IV SOLN
500.0000 [IU] | Freq: Once | INTRAVENOUS | Status: AC
  Administered 2023-04-03: 500 [IU]
  Filled 2023-04-03: qty 5

## 2023-04-03 MED ORDER — SODIUM CHLORIDE 0.9% FLUSH
10.0000 mL | Freq: Once | INTRAVENOUS | Status: AC
  Administered 2023-04-03: 10 mL via INTRAVENOUS
  Filled 2023-04-03: qty 10

## 2023-04-03 NOTE — Progress Notes (Signed)
Hematology/Oncology Progress note [phone] [phone]    CHIEF COMPLAINTS/REASON FOR VISIT:  Radiation/drug induced colitis follow up  HISTORY OF PRESENTING ILLNESS:  Patient reports remote history of left lower extremity DVT in 2002.  He was initiated on Lovenox and bridged to Coumadin.  Patient took warfarin for 2 years before anticoagulation was stopped. 12/03/2021, patient presented to emergency room for evaluation of right lower extremity pain and swelling for about a week.  Started on the inner side of right thigh and migrated to the right calf. + Associated with swelling.  Denies any recent injury, hospitalization, surgery.  He first noticed the symptoms after playing basketball with his grandson.   12/03/2021, right lower extremity ultrasound showed occlusive DVT extending from the mid aspect of the femoral vein through the imaged tibial vein.  Age-indeterminate. Patient was started on Xarelto. He was referred to establish care with vascular surgeon and was seen by Sheppard Plumber on 12/06/2021.  Shared decision was made not to proceed with embolectomy.  Continue anticoagulation. Patient was referred to hematology oncology for further evaluation.  Patient denies any family history of blood clots.  Denies any unintentional weight loss, fever or night sweats. 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  He sometimes stay in his car while waiting for next assessment appointment.  He reports the right lower extremity symptom has improved since the start of Xarelto.  No active bleeding events.  #12/18/2021 hypercoagulable work-up showed JAK2 V617F mutation negative, with reflex to other mutations CALR, MPL, JAK 2 Ex 12-15 mutations negative, negative anticardiolipin IgG and IgM antibodies, positive lupus anticoagulant, negative factor V Leiden mutation, negative prothrombin gene mutation,  normal protein C activity, normal protein S antigen level. Patient was recommended to switch to Coumadin with INR goal of 2-3.  #03/15/2022, repeat lupus anticoagulant is persistently positive- + antiphospholipid syndrome.  Patient is currently on Lovenox 1 mg/kg twice daily.  admitted due to multifocal pneumonia, treated with Influenza panel negative, COVID-negative, HIV negative, Complete course of antibiotics with Zithromax, Vantin   + HCV antibody positive, HCV RNA quantification not detected. HCV RT PCR not detected.   Oncology History  Cholangiocarcinoma (HCC)  10/30/2021 Imaging   CT chest wo contrast 1. Heterogeneous bilateral ground-glass disease, multilobar but worst in the right middle lobe, lingula and lower lobes. There is progression of the abnormality since 10/12/2022. Though pneumonia could produce this appearance, given persistence and fluctuating appearance since October, consider alternative entities such as hypersensitivity pneumonitis or idiopathic interstitial pneumonia. 2. Slightly enlarged pulmonary trunk, possible arterial hypertension 3. Redemonstrated large hypodense right hepatic lobe mass.   08/18/2022 Imaging   CT chest w contrast showed 1. Multifocal airspace disease in both lungs, left side greater than right. Findings are most compatible with multifocal pneumonia. 2. Multiple new hepatic lesions with enlarging lymph nodes in the upper abdomen and chest. Findings are concerning for metastatic disease. Based on the multifocal pneumonia, hepatic abscesses would also be in the differential diagnosis. Recommend further characterization of the abdomen and pelvis with CT with IV contrast. 3. Enlargement of the main pulmonary artery could be associated with pulmonary hypertension. 4. Coronary artery calcifications. 5. Multinodular goiter. Patient has known thyroid nodules and previous thyroid ultrasound.    08/18/2022 Imaging   CT abdomen pelvis w contrast  Interval  development of 6.5 x 5.2 cm heterogeneously enhancing mass in right hepatic lobe. This is highly concerning for neoplasm  or malignancy, and further evaluation with MRI is recommended.   Mildly enlarged periaortic adenopathy is noted, with the largest lymph node measuring 12 mm. Metastatic disease cannot be excluded. Mildly enlarged adenopathy is also noted in the gastrohepatic ligament, but this is unchanged compared to prior exam.   Stable bibasilar lung opacities are noted concerning for inflammation, atelectasis or possibly scarring.   Grossly stable multi-septated cystic lesion is seen involving upper pole of right kidney with peripheral calcifications compared to prior exam of 2019, most likely representing benign etiology.   Aortic Atherosclerosis    08/18/2022 - 08/21/2022 Hospital Admission   Admitted due to multifocal pneumonia, treated with Influenza panel negative, COVID-negative, HIV negative, Complete course of antibiotics with Zithromax, Vantin     08/19/2022 Imaging   MR abdomen w wo contrast  Marked caudate lobe hypertrophy, highly suspicious for hepatic cirrhosis.   Numerous hypervascular masses throughout the right hepatic lobe, highly suspicious for multifocal hepatocellular carcinoma. Recommend correlation with AFP and consider tissue sampling.   Mild abdominal lymphadenopathy, with differential diagnosis including metastatic disease and reactive lymphadenopathy in setting of cirrhosis.   Bilateral lower lobe infiltrates, as better demonstrated on recent CT.    08/19/2022 Tumor Marker   AFP 4.1   08/27/2022 Initial Diagnosis   Cholangiocarcinoma  -08/20/22 Liver mass biopsy showed poorly differentiated carcinoma Immunohistochemical stains show that the poorly differentiated carcinoma is positive for CK7 and MOC-31, suggestive of a poorly differentiated adenocarcinoma.  Immunohistochemical stains for CK20, CDX2, HepPar 1 and arginase are negative.  Immunostain for  glypican-3 shows very focal  likely nonspecific labeling.  This immunoprofile is nonspecific but can be compatible with a poorly differentiated primary cholangiocarcinoma in absence of any other lesions.   NGS showed BAP 1 mutation.  Case was presented at tumor board, I have also discussed with pathologist Dr.Rubinas,  IHC pattern is more consistent with adenocarcinoma, unlikely HCC [due to negative HepPar 1 and arginase], cholangiocarcinoma is favored, although this is a diagnosis of exclusion. Second opinion ar [facility]   08/27/2022 Cancer Staging   Staging form: Intrahepatic Bile Duct, AJCC 8th Edition - Clinical stage from 08/27/2022: Stage IV (cT2, cN1, cM1) - Signed by [name], MD on 09/13/2022 Stage prefix: Initial diagnosis   08/27/2022 Tumor Marker   CA 19.9  16 CEA 2.8   09/12/2022 Imaging   PET scan showed 1. Large right hepatic lobe mass is hypermetabolic and consistent with known cholangiocarcinoma. 2. Scattered borderline enlarged upper abdominal lymph nodes do not show any significant FDG uptake. 3. 4 hypermetabolic bone lesions consistent with metastatic disease. 4. Progressive diffuse airspace process in the left lung could be due to atypical pneumonia, pulmonary hemorrhage or left-sided aspiration. 5. Aortic atherosclerosis.  09/12/2022 I had a phone discussion with patient after his PET scan resulted.  Recommend systemic chemotherapy.  Patient would like to defer until his second opinion visit at [facility].   09/15/2022 Imaging   CT angio chest pulmonary embolism protocol showed 1. No evidence for pulmonary embolism. 2. Progression of bilateral multifocal patchy ground-glass and interstitial opacities, left greater than right. Findings are concerning for multifocal pneumonia. 3. Air-fluid levels in the left lower lobe bronchi worrisome for aspiration. 4. Mediastinal and hilar lymphadenopathy has increased in size and number when compared to the prior study, likely reactive.5.  Cardiomegaly. 6. Findings compatible with pulmonary artery hypertension.7. Stable right liver mass and complex right renal cystic lesion. 8. Stable enlargement of the left thyroid.   09/15/2022 - 09/17/2022 Hospital Admission  Patient was admitted due to shortness of breath, CT showed progression of bilateral multifocal patchy groundglass and interstitial opacities.  Left greater than right.  Concerning for multifocal pneumonia.  Patient was started on broad-spectrum antibiotics with significant improvement.  He was discharged on oral antibiotics.   09/19/2022 Miscellaneous   Patient went to [facility] for second opinion.  He was seen by Shirlee Latch, who felt that the presentation is consistent with metastatic cholangiocarcinoma, and agrees with the plan of cisplatin/gemcitabine/durvalumab..    10/11/2022 -  Chemotherapy   Patient is on Treatment Plan : Cisplatin D1,8 + Gemcitabine + Durvalumab  D1,8 q21d x     10/12/2022 Imaging   CT chest w contrast 1. Bilateral airspace and ground-glass opacities have mildly decreased in amount and density when compared to the prior study. Findings are compatible with resolving multifocal pneumonia. Continued follow-up imaging recommended. 2. Stable enlarged heterogeneous left thyroid gland with 3.2 cm nodule. This can be further evaluated with dedicated ultrasound. 3. Stable hepatic and right renal lesions, incompletely evaluated.   Aortic Atherosclerosis (ICD10-I70.0).   12/11/2022 Imaging   PET showed  1. There is been mild increase in size of the tracer avid mass within right lobe of liver compatible with known cholangiocarcinoma. The degree of tracer uptake however is slightly decreased in the interval. 2. Multifocal tracer avid bone metastases are again noted. When compared with the previous exam there has been an mixed interval response to therapy. Specifically, there has been interval increase in size and FDG uptake associated with the right ischial  tuberosity lesion and there is a new lesion within the posterior left iliac bone. Decreased tracer uptake is associated with the left scapular lesion and there is stable tracer uptake associated with the right T1 transverse process lesion. 3. Persistent ground-glass and airspace densities within the left upper lobe and left lower lobe compatible with inflammatory or infectious process. Interval resolution of previous right lung ground-glass and airspace densities.    12/24/2022 Procedure   Medi port placement by Dr. Wyn Quaker   01/22/2023 Imaging   CT chest abdomen pelvis with contrast 1. Hypodense lesion of the right lobe of the liver, hepatic segment VII/VIII is slightly diminished in size, consistent with treatment response. 2. No evident change in additional smaller hypodense lesions scattered throughout the right lobe of the liver, these more difficult to see on prior noncontrast PET-CT. 3. Unchanged enlarged celiac axis, gastrohepatic ligament, and retroperitoneal lymph nodes. Unchanged prominent high right paratracheal lymph nodes. 4. Unchanged osseous metastases. 5. Slightly improved, although still extensive ground-glass airspace opacity primarily seen throughout the left lower lobe and lingula,nonspecific and infectious or inflammatory. 6. Coronary artery disease. 7. Emphysema.   02/11/2023 Imaging   Patient self decreased Lovenox dosage to 120mg  once daily.  Increased lower extremity swelling.   02/11/23 [place] bilateral lower extremity showed 1. Nonocclusive DVT within the popliteal veins bilaterally. 2. Occlusive DVT within the posterior tibial veins of the right calf.       02/13/2023 Imaging   CT chest Angiogram  1. Technically adequate exam showing no acute pulmonary embolus. 2. Cardiomegaly. Coronary artery calcifications. 3. Bilateral pleural effusions. 4. Improved aeration of the RIGHT UPPER and LOWER lobes compared to prior study. 5. LEFT LOWER lobe and lingula airspace filling  and atelectasis, slightly improved compared to prior study. 6. Cirrhotic morphology of the liver. 7.7 centimeters mass in the RIGHT hepatic lobe. 7. Gastrohepatic ligament adenopathy. 8. A 7 millimeters sclerotic lesion in the UPPER sternum is suspicious  for metastatic disease. 9. LEFT thyroid goiter.   02/13/2023 - 02/15/2023 Hospital Admission   A flutter with variable A-V block. Heart rate up to 140.  BNP 119.8 04/18: Echo no overt CHF - EF 50-55  Cardiology recs beta blocker to metoprolol succinate 50 mg bid, Torsemid 40mg  daily Adjusting Lovenox 14mg  BID.    03/03/2023 -  Chemotherapy   Futibatinib 20mg   03/12/23 Decreased to 16mg  daily    INTERVAL HISTORY Christopher Marsh is a 62 y.o. male who has above history reviewed by me today presents for discussion of his radiation colitis:  Patient reports that he has had colitis during his course of radiation. This has been an ongoing problem but recently returned. He states that in the past he has tried imodium but it causes abdominal cramping so he stopped this medication. He then tried Lomotil which he tolerated well at 2 pills per day. At 2 pills per day his loose stools were occurring about 2-3 times per day. He wanted them to stop altogether so he tried 4 pills per day which then caused abdominal cramps. He stopped the Lomotil and stools are now occurring about 2-4 times per day. Loose to slightly watery. Non-bloody and without fatty deposits. Occurs during the day and at night. No fevers, vomiting, nausea, current abdominal pains.    Review of Systems  Constitutional:  Positive for fatigue. Negative for appetite change, chills and fever.  HENT:   Negative for hearing loss and voice change.   Eyes:  Negative for eye problems.  Respiratory:  Negative for chest tightness, cough and shortness of breath.   Cardiovascular:  Positive for leg swelling (chronic). Negative for chest pain.  Gastrointestinal:  Positive for diarrhea. Negative for  abdominal distention, abdominal pain and blood in stool.  Endocrine: Negative for hot flashes.  Genitourinary:  Negative for difficulty urinating and frequency.   Musculoskeletal:  Negative for arthralgias.  Skin:  Negative for itching and rash.  Neurological:  Negative for extremity weakness.  Hematological:  Negative for adenopathy. Bruises/bleeds easily.  Psychiatric/Behavioral:  Negative for confusion.     MEDICAL HISTORY:  Past Medical History:  Diagnosis Date   Antiphospholipid antibody syndrome (HCC)    Arthritis    Cancer (HCC)    Diabetes mellitus without complication (HCC)    DVT (deep venous thrombosis) (HCC)    Lt leg   Dyspnea    Elevated lipids    Erythrocytosis    Hyperlipidemia    Hypertension    Lower extremity edema    Multinodular thyroid    Post-thrombotic syndrome     SURGICAL HISTORY: Past Surgical History:  Procedure Laterality Date   CARDIOVERSION N/A 03/22/2023   Procedure: CARDIOVERSION;  Surgeon: Antonieta Iba, MD;  Location: [facility] ORS;  Service: Cardiovascular;  Laterality: N/A;   COLONOSCOPY WITH PROPOFOL N/A 09/23/2018   Per Dr. Norma Fredrickson, polyp, repeat in 3 yrs    EYE MUSCLE SURGERY     JOINT REPLACEMENT Left 07/22/2017   PORTA CATH INSERTION N/A 12/24/2022   Procedure: PORTA CATH INSERTION;  Surgeon: Annice Needy, MD;  Location: [facility] INVASIVE CV LAB;  Service: Cardiovascular;  Laterality: N/A;   TOTAL HIP ARTHROPLASTY Left 07/22/2017   Procedure: TOTAL HIP ARTHROPLASTY;  Surgeon: Donato Heinz, MD;  Location: [facility] ORS;  Service: Orthopedics;  Laterality: Left;    SOCIAL HISTORY: Social History   Socioeconomic History   Marital status: Married    Spouse name: Not on file   Number of children:  Not on file   Years of education: Not on file   Highest education level: Not on file  Occupational History   Not on file  Tobacco Use   Smoking status: Former    Packs/day: 0.50    Years: 6.00    Additional pack years: 0.00    Total pack  years: 3.00    Types: Cigarettes    Quit date: 74    Years since quitting: 42.4   Smokeless tobacco: Never   Tobacco comments:    Pt states when he did smoke he smoked at most 1 ppd. ALS 09/26/2022  Vaping Use   Vaping Use: Never used  Substance and Sexual Activity   Alcohol use: Not Currently    Comment: occ   Drug use: No   Sexual activity: Not on file  Other Topics Concern   Not on file  Social History Narrative   Not on file   Social Determinants of Health   Financial Resource Strain: Low Risk  (08/27/2022)   Overall Financial Resource Strain (CARDIA)    Difficulty of Paying Living Expenses: Not very hard  Food Insecurity: No Food Insecurity (02/18/2023)   Hunger Vital Sign    Worried About Running Out of Food in the Last Year: Never true    Ran Out of Food in the Last Year: Never true  Transportation Needs: No Transportation Needs (02/18/2023)   PRAPARE - Administrator, Civil Service (Medical): No    Lack of Transportation (Non-Medical): No  Physical Activity: Insufficiently Active (08/27/2022)   Exercise Vital Sign    Days of Exercise per Week: 3 days    Minutes of Exercise per Session: 30 min  Stress: Stress Concern Present (08/27/2022)   [employer] of Occupational Health - Occupational Stress Questionnaire    Feeling of Stress : Rather much  Social Connections: Socially Integrated (08/27/2022)   Social Connection and Isolation Panel [NHANES]    Frequency of Communication with Friends and Family: More than three times a week    Frequency of Social Gatherings with Friends and Family: More than three times a week    Attends Religious Services: 1 to 4 times per year    Active Member of [employer] or Organizations: Yes    Attends Banker Meetings: Never    Marital Status: Married  Catering manager Violence: Not At Risk (02/14/2023)   Humiliation, Afraid, Rape, and Kick questionnaire    Fear of Current or Ex-Partner: No    Emotionally  Abused: No    Physically Abused: No    Sexually Abused: No    FAMILY HISTORY: Family History  Problem Relation Age of Onset   Breast cancer Mother    Emphysema Father    Colon cancer Maternal Grandmother     ALLERGIES:  has No Known Allergies.  MEDICATIONS:  Current Outpatient Medications  Medication Sig Dispense Refill   albuterol (VENTOLIN HFA) 108 (90 Base) MCG/ACT inhaler TAKE 2 PUFFS BY MOUTH EVERY 4 HOURS AS NEEDED 8.5 each 1   Calcium Carbonate 500 MG CHEW Chew 2 tablets (1,000 mg total) by mouth daily. 60 tablet 0   diphenoxylate-atropine (LOMOTIL) 2.5-0.025 MG tablet Take 1 tablet by mouth 4 (four) times daily as needed for diarrhea or loose stools. 120 tablet 3   enoxaparin (LOVENOX) 150 MG/ML injection Inject 0.86 mLs (129 mg total) into the skin every 12 (twelve) hours. 60 mL 0   futibatinib, 16 mg daily dose, (LYTGOBI) 4 MG tablet Take 4  tablets (16 mg total) by mouth daily. 112 tablet 0   glipiZIDE (GLUCOTROL) 10 MG tablet Take 1 tablet (10 mg total) by mouth 2 (two) times daily before a meal. 180 tablet 3   glucose blood test strip 1 each by Other route as needed for other. accu chek aviva plusUse as instructed     Lactulose 20 GM/30ML SOLN Take 30 mLs (20 g total) by mouth daily as needed (constipation.). 450 mL 0   lidocaine-prilocaine (EMLA) cream Apply 1 Application topically as needed. 30 g 5   LORazepam (ATIVAN) 0.5 MG tablet TAKE 1 TABLET (0.5 MG TOTAL) BY MOUTH EVERY 8 (EIGHT) HOURS AS NEEDED FOR ANXIETY (NAUSEA VOMITING). 60 tablet 0   magnesium chloride (SLOW-MAG) 64 MG TBEC SR tablet Take 1 tablet (64 mg total) by mouth 2 (two) times daily. 60 tablet 0   metFORMIN (GLUCOPHAGE) 500 MG tablet TAKE TWO TABLETS BY MOUTH EVERY MORNING WITH BREAKFAST 180 tablet 1   Metoprolol Tartrate 75 MG TABS Take 1 tablet (75 mg total) by mouth 2 (two) times daily. 60 tablet 6   MUCINEX D MAX STRENGTH 559 334 0737 MG TB12 Take 1,200 mg by mouth 2 (two) times daily as needed (for  congestion or to loosen mucous in the chest).     ondansetron (ZOFRAN) 8 MG tablet Take 1 tablet (8 mg total) by mouth every 8 (eight) hours as needed for nausea or vomiting. Start on the third day after cisplatin. 90 tablet 1   potassium chloride (KLOR-CON) 10 MEQ tablet Take 1 tablet (10 mEq total) by mouth daily. 90 tablet 3   pravastatin (PRAVACHOL) 20 MG tablet Take 1 tablet (20 mg total) by mouth daily. 90 tablet 3   prochlorperazine (COMPAZINE) 10 MG tablet TAKE 1 TABLET (10 MG TOTAL) BY MOUTH EVERY 6 HOURS AS NEEDED FOR NAUSEA AND VOMITING 90 tablet 1   tadalafil (CIALIS) 20 MG tablet Take 0.5-1 tablets (10-20 mg total) by mouth every other day as needed for erectile dysfunction. 20 tablet 11   torsemide (DEMADEX) 20 MG tablet Take 1 tablet (20 mg total) by mouth 2 (two) times daily as needed. 180 tablet 3   triamcinolone cream (KENALOG) 0.1 % Apply 1 Application topically 3 (three) times daily. (Patient taking differently: Apply 1 Application topically 3 (three) times daily as needed (for bilateral, below-the-knees skin irritation).) 453.6 g 1   dexamethasone (DECADRON) 4 MG tablet TAKE 2 TABLETS DAILY X 3 DAYS STARTING THE DAY AFTER CISPLATIN CHEMOTHERAPY. TAKE WITH FOOD. (Patient not taking: Reported on 02/20/2023) 30 tablet 1   No current facility-administered medications for this visit.     PHYSICAL EXAMINATION: ECOG PERFORMANCE STATUS: 1 - Symptomatic but completely ambulatory Vitals:   04/03/23 1345  BP: (!) 117/56  Pulse: 68  Temp: 98 F (36.7 C)   Filed Weights   04/03/23 1345  Weight: 284 lb (128.8 kg)    Physical Exam Constitutional:      General: He is not in acute distress. HENT:     Head: Normocephalic and atraumatic.  Eyes:     General: No scleral icterus. Cardiovascular:     Rate and Rhythm: Normal rate.  Pulmonary:     Effort: Pulmonary effort is normal. No respiratory distress.     Breath sounds: No wheezing.  Abdominal:     General: Bowel sounds are  normal. There is no distension.  Musculoskeletal:        General: Normal range of motion.     Cervical back: Normal  range of motion and neck supple.     Right lower leg: Edema (2+ pitting) present.     Left lower leg: Edema (2+ pitting) present.  Skin:    General: Skin is warm and dry.     Findings: No erythema or rash.  Neurological:     Mental Status: He is alert and oriented to person, place, and time. Mental status is at baseline.     Cranial Nerves: No cranial nerve deficit.  Psychiatric:        Mood and Affect: Mood normal.     LABORATORY DATA:  I have reviewed the data as listed    Latest Ref Rng & Units 03/27/2023   12:52 PM 03/12/2023    9:19 AM 02/19/2023    9:54 AM  CBC  WBC 4.0 - 10.5 K/uL 4.2  5.9  2.9   Hemoglobin 13.0 - 17.0 g/dL 10.9  60.4  9.9   Hematocrit 39.0 - 52.0 % 34.5  36.3  32.0   Platelets 150 - 400 K/uL 169  170  385       Latest Ref Rng & Units 03/27/2023   12:52 PM 03/12/2023    9:19 AM 02/19/2023    9:54 AM  CMP  Glucose 70 - 99 mg/dL 540  96  981   BUN 8 - 23 mg/dL 7  9  9    Creatinine 0.61 - 1.24 mg/dL 1.91  4.78  2.95   Sodium 135 - 145 mmol/L 132  136  135   Potassium 3.5 - 5.1 mmol/L 3.4  3.5  3.7   Chloride 98 - 111 mmol/L 100  100  99   CO2 22 - 32 mmol/L 21  23  23    Calcium 8.9 - 10.3 mg/dL 8.6  8.9  8.1   Total Protein 6.5 - 8.1 g/dL 6.1  6.0  6.4   Total Bilirubin 0.3 - 1.2 mg/dL 0.2  0.6  0.6   Alkaline Phos 38 - 126 U/L 51  48  49   AST 15 - 41 U/L 25  23  26    ALT 0 - 44 U/L 10  12  14      Assessment and Plan:  Encounter Diagnoses  Name Primary?   Cholangiocarcinoma (HCC) Yes   Chemotherapy induced diarrhea    Hypomagnesemia    Hyperphosphatemia    Hyperkalemia      Cancer Staging  Cholangiocarcinoma [facility]) Staging form: Intrahepatic Bile Duct, AJCC 8th Edition - Clinical stage from 08/27/2022: Stage IV (cT2, cN1, cM1) - Signed by [name], MD on 09/13/2022  No change in his plan oncological plan  today   Chemotherapy/radiation induced diarrhea: Mild. I have suggested he trial 3 lomotil per day since 2 is not as effective as he prefers and 4 causes stomach cramping. If this is ineffective he may benefit from steroids/holding XRT. No IVF needed at this time after discussion of symptoms and review of labs. Has follow up next week with Dr. Cathie Hoops.   Hypomagnesemia:Previously low secondary to GI loss now normal. Will continue to monitor.   Hyperphosphatemia: Chronic and stable. Will continue monitoring and having him work with our team further. He is not using any phosphate containing laxatives or enemas. He is not on vitamin D supplementation. If hyponatremia worsens I would suggest work up for potential hypoparathyroidism.   Hyperkalemia: Previously low due to GI loss normal normal. We will continue to monitor.   Disposition: No IVF needed today Trial 3 lomotils per day  RTC next week with Dr. Cathie Hoops as previously planned  Rushie Chestnut PA-C

## 2023-04-04 ENCOUNTER — Other Ambulatory Visit (HOSPITAL_COMMUNITY): Payer: Self-pay

## 2023-04-05 ENCOUNTER — Other Ambulatory Visit (HOSPITAL_COMMUNITY): Payer: Self-pay

## 2023-04-05 ENCOUNTER — Encounter: Payer: Self-pay | Admitting: Oncology

## 2023-04-06 ENCOUNTER — Other Ambulatory Visit (HOSPITAL_COMMUNITY): Payer: Self-pay

## 2023-04-08 ENCOUNTER — Ambulatory Visit: Payer: BC Managed Care – PPO | Admitting: Radiation Oncology

## 2023-04-08 ENCOUNTER — Other Ambulatory Visit: Payer: Self-pay

## 2023-04-09 ENCOUNTER — Other Ambulatory Visit: Payer: Self-pay | Admitting: Oncology

## 2023-04-11 ENCOUNTER — Other Ambulatory Visit: Payer: Self-pay

## 2023-04-11 ENCOUNTER — Inpatient Hospital Stay: Payer: BC Managed Care – PPO

## 2023-04-11 ENCOUNTER — Inpatient Hospital Stay (HOSPITAL_BASED_OUTPATIENT_CLINIC_OR_DEPARTMENT_OTHER): Payer: BC Managed Care – PPO | Admitting: Oncology

## 2023-04-11 VITALS — BP 136/78 | HR 66 | Temp 97.3°F | Resp 18 | Wt 275.7 lb

## 2023-04-11 DIAGNOSIS — Z452 Encounter for adjustment and management of vascular access device: Secondary | ICD-10-CM | POA: Diagnosis not present

## 2023-04-11 DIAGNOSIS — E042 Nontoxic multinodular goiter: Secondary | ICD-10-CM | POA: Diagnosis not present

## 2023-04-11 DIAGNOSIS — I2510 Atherosclerotic heart disease of native coronary artery without angina pectoris: Secondary | ICD-10-CM | POA: Diagnosis not present

## 2023-04-11 DIAGNOSIS — C221 Intrahepatic bile duct carcinoma: Secondary | ICD-10-CM

## 2023-04-11 DIAGNOSIS — C7951 Secondary malignant neoplasm of bone: Secondary | ICD-10-CM | POA: Diagnosis not present

## 2023-04-11 DIAGNOSIS — K521 Toxic gastroenteritis and colitis: Secondary | ICD-10-CM

## 2023-04-11 DIAGNOSIS — D6861 Antiphospholipid syndrome: Secondary | ICD-10-CM | POA: Diagnosis not present

## 2023-04-11 DIAGNOSIS — I4891 Unspecified atrial fibrillation: Secondary | ICD-10-CM | POA: Diagnosis not present

## 2023-04-11 DIAGNOSIS — Z86718 Personal history of other venous thrombosis and embolism: Secondary | ICD-10-CM | POA: Diagnosis not present

## 2023-04-11 DIAGNOSIS — I4819 Other persistent atrial fibrillation: Secondary | ICD-10-CM | POA: Insufficient documentation

## 2023-04-11 DIAGNOSIS — T451X5A Adverse effect of antineoplastic and immunosuppressive drugs, initial encounter: Secondary | ICD-10-CM

## 2023-04-11 DIAGNOSIS — E875 Hyperkalemia: Secondary | ICD-10-CM | POA: Diagnosis not present

## 2023-04-11 DIAGNOSIS — Z7901 Long term (current) use of anticoagulants: Secondary | ICD-10-CM | POA: Diagnosis not present

## 2023-04-11 LAB — CBC WITH DIFFERENTIAL (CANCER CENTER ONLY)
Abs Immature Granulocytes: 0.01 10*3/uL (ref 0.00–0.07)
Basophils Absolute: 0 10*3/uL (ref 0.0–0.1)
Basophils Relative: 1 %
Eosinophils Absolute: 0 10*3/uL (ref 0.0–0.5)
Eosinophils Relative: 1 %
HCT: 36.8 % — ABNORMAL LOW (ref 39.0–52.0)
Hemoglobin: 11.6 g/dL — ABNORMAL LOW (ref 13.0–17.0)
Immature Granulocytes: 0 %
Lymphocytes Relative: 11 %
Lymphs Abs: 0.4 10*3/uL — ABNORMAL LOW (ref 0.7–4.0)
MCH: 29.2 pg (ref 26.0–34.0)
MCHC: 31.5 g/dL (ref 30.0–36.0)
MCV: 92.7 fL (ref 80.0–100.0)
Monocytes Absolute: 0.5 10*3/uL (ref 0.1–1.0)
Monocytes Relative: 12 %
Neutro Abs: 3 10*3/uL (ref 1.7–7.7)
Neutrophils Relative %: 75 %
Platelet Count: 185 10*3/uL (ref 150–400)
RBC: 3.97 MIL/uL — ABNORMAL LOW (ref 4.22–5.81)
RDW: 13.6 % (ref 11.5–15.5)
WBC Count: 4 10*3/uL (ref 4.0–10.5)
nRBC: 0 % (ref 0.0–0.2)

## 2023-04-11 LAB — CMP (CANCER CENTER ONLY)
ALT: 11 U/L (ref 0–44)
AST: 19 U/L (ref 15–41)
Albumin: 3.3 g/dL — ABNORMAL LOW (ref 3.5–5.0)
Alkaline Phosphatase: 61 U/L (ref 38–126)
Anion gap: 9 (ref 5–15)
BUN: 8 mg/dL (ref 8–23)
CO2: 23 mmol/L (ref 22–32)
Calcium: 9.1 mg/dL (ref 8.9–10.3)
Chloride: 104 mmol/L (ref 98–111)
Creatinine: 1.02 mg/dL (ref 0.61–1.24)
GFR, Estimated: >60 mL/min (ref >60)
Glucose, Bld: 109 mg/dL — ABNORMAL HIGH (ref 70–99)
Potassium: 4 mmol/L (ref 3.5–5.1)
Sodium: 136 mmol/L (ref 135–145)
Total Bilirubin: 0.2 mg/dL — ABNORMAL LOW (ref 0.3–1.2)
Total Protein: 6.2 g/dL — ABNORMAL LOW (ref 6.5–8.1)

## 2023-04-11 LAB — PHOSPHORUS: Phosphorus: 6.7 mg/dL — ABNORMAL HIGH (ref 2.5–4.6)

## 2023-04-11 MED ORDER — SODIUM CHLORIDE 0.9% FLUSH
10.0000 mL | Freq: Once | INTRAVENOUS | Status: AC
  Administered 2023-04-11: 10 mL via INTRAVENOUS
  Filled 2023-04-11: qty 10

## 2023-04-11 MED ORDER — TRAZODONE HCL 50 MG PO TABS: 50.0000 mg | ORAL_TABLET | Freq: Every evening | ORAL | 1 refills | Status: DC | PRN

## 2023-04-11 MED ORDER — HEPARIN SOD (PORK) LOCK FLUSH 100 UNIT/ML IV SOLN
500.0000 [IU] | Freq: Once | INTRAVENOUS | Status: AC
  Administered 2023-04-11: 500 [IU] via INTRAVENOUS
  Filled 2023-04-11: qty 5

## 2023-04-11 MED ORDER — HEPARIN SOD (PORK) LOCK FLUSH 100 UNIT/ML IV SOLN
500.0000 [IU] | Freq: Once | INTRAVENOUS | Status: DC
  Filled 2023-04-11: qty 5

## 2023-04-11 NOTE — Assessment & Plan Note (Addendum)
Side effects from Futibatinib.  calcium carbonate 1100 mg to twice daily. Recommend low phosphorus diet- refer to nutritionist for further education.  Phosphorus level 6.7, repeat in 1 week.  Consider adding sevelamer

## 2023-04-11 NOTE — Assessment & Plan Note (Addendum)
Continue Lomotil QID PRN, stop Lactulose

## 2023-04-11 NOTE — Assessment & Plan Note (Signed)
#  Recurrent lower extremity DVT, secondary to antiphospholipid syndrome.-. Need  life time anticoagulation Continue  Lovenox 1 mg/kg twice daily, as of today, recommend 130mg BID as he has lost weight.  Currently his platelet count is stable, off cytoreductive chemotherapy. Recommend patient to switch to Coumadin with INR goal of 2-3.  Referral to Coumadin clinic.  He agrees with the plan. 

## 2023-04-11 NOTE — Assessment & Plan Note (Addendum)
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  03/03/23 2nd line Futibatinib 20mg  daily- dose reduced to 16mg   Continue Futibatinib 16mg  daily Recommend ophthalmology follow up for blurry vision.  CT for evaluation of treatment response.

## 2023-04-11 NOTE — Assessment & Plan Note (Signed)
And cardiomyopathy, follow up with cardiology Rate controlled. S/p ablation on 03/26/23  

## 2023-04-11 NOTE — Assessment & Plan Note (Addendum)
S/p palliative  radiation  Consider bisphosphonate after dental clearance is obtained. He will see Radonc again for evaluation of left hip lesion radiation due to concern of pathological fracture.

## 2023-04-11 NOTE — Assessment & Plan Note (Addendum)
Continue calcium supplementation, currently on 1100 mg twice daily.   Recommend Vitamin D 1000 units daily.

## 2023-04-11 NOTE — Progress Notes (Signed)
Hematology/Oncology Progress note [phone] [phone]    CHIEF COMPLAINTS/REASON FOR VISIT:  Follow up for right lower extremity DVT, antiphospholipid syndrome, metastatic cholangiocarcinoma.  ASSESSMENT & PLAN:   Cancer Staging  Cholangiocarcinoma [facility]) Staging form: Intrahepatic Bile Duct, AJCC 8th Edition - Clinical stage from 08/27/2022: Stage IV (cT2, cN1, cM1) - Signed by [name], MD on 09/13/2022   Cholangiocarcinoma (HCC) Cholangiocarcinoma, NGS-Tempus liquid biopsy FGFR2 -ADK mutation, TMB 0,  S/p Gemcitabine, Cisplatin and Durvalumab x 6 , 01/2023 CT progression/enlarged liver lesions --.  03/03/23 2nd line Futibatinib 20mg  daily- dose reduced to 16mg   Continue Futibatinib 16mg  daily Recommend ophthalmology follow up for blurry vision.  CT for evaluation of treatment response.   Hypocalcemia Continue calcium supplementation, currently on 1100 mg twice daily.   Recommend Vitamin D 1000 units daily.   Antiphospholipid syndrome (HCC) #Recurrent lower extremity DVT, secondary to antiphospholipid syndrome.-. Need  life time anticoagulation Continue  Lovenox 1 mg/kg twice daily, as of today, recommend 130mg  BID as he has lost weight.  Currently his platelet count is stable, off cytoreductive chemotherapy. Recommend patient to switch to Coumadin with INR goal of 2-3.  Referral to Coumadin clinic.  He agrees with the plan.  Metastasis to bone [facility]) S/p palliative  radiation  Consider bisphosphonate after dental clearance is obtained. He will see Radonc again for evaluation of left hip lesion radiation due to concern of pathological fracture.    Hypomagnesemia Continue slow Mag to twice daily    Atrial fibrillation (HCC) And cardiomyopathy, follow up with cardiology Rate controlled. S/p ablation on 03/26/23   Hyperphosphatemia Side effects from Futibatinib.  calcium carbonate 1100 mg to twice daily. Recommend low phosphorus diet- refer to  nutritionist for further education.  Phosphorus level 6.7, repeat in 1 week.  Consider adding sevelamer   Chemotherapy induced diarrhea Continue Lomotil QID PRN, stop Lactulose     Orders Placed This Encounter  Procedures   CT CHEST ABDOMEN PELVIS W CONTRAST    Standing Status:   Future    Standing Expiration Date:   04/10/2024    Order Specific Question:   If indicated for the ordered procedure, I authorize the administration of contrast media per Radiology protocol    Answer:   Yes    Order Specific Question:   Does the patient have a contrast media/X-ray dye allergy?    Answer:   No    Order Specific Question:   Preferred imaging location?    Answer:   [facility]    Order Specific Question:   If indicated for the ordered procedure, I authorize the administration of oral contrast media per Radiology protocol    Answer:   Yes   Phosphorus    Standing Status:   Future    Number of Occurrences:   1    Standing Expiration Date:   04/10/2024   CBC with Differential (Cancer Center Only)    Standing Status:   Future    Standing Expiration Date:   04/10/2024   CMP (Cancer Center only)    Standing Status:   Future    Standing Expiration Date:   04/10/2024   Phosphorus    Standing Status:   Future    Standing Expiration Date:   04/10/2024   Magnesium    Standing Status:   Future    Standing Expiration Date:   04/10/2024   Ambulatory referral to Anticoagulation Monitoring    Referral Priority:   Routine    Referral Type:  Consultation    Referral Reason:   Specialty Services Required    Number of Visits Requested:   1   Follow up 3 weeks  All questions were answered. The patient knows to call the clinic with any problems, questions or concerns.  [name], MD, PhD [facility] Hematology Oncology 04/11/2023    HISTORY OF PRESENTING ILLNESS:  Patient reports remote history of left lower extremity DVT in 2002.  He was initiated on Lovenox and bridged to Coumadin.  Patient  took warfarin for 2 years before anticoagulation was stopped. 12/03/2021, patient presented to emergency room for evaluation of right lower extremity pain and swelling for about a week.  Started on the inner side of right thigh and migrated to the right calf. + Associated with swelling.  Denies any recent injury, hospitalization, surgery.  He first noticed the symptoms after playing basketball with his grandson.   12/03/2021, right lower extremity ultrasound showed occlusive DVT extending from the mid aspect of the femoral vein through the imaged tibial vein.  Age-indeterminate. Patient was started on Xarelto. He was referred to establish care with vascular surgeon and was seen by Sheppard Plumber on 12/06/2021.  Shared decision was made not to proceed with embolectomy.  Continue anticoagulation. Patient was referred to hematology oncology for further evaluation.  Patient denies any family history of blood clots.  Denies any unintentional weight loss, fever or night sweats. 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  He sometimes stay in his car while waiting for next assessment appointment.  He reports the right lower extremity symptom has improved since the start of Xarelto.  No active bleeding events.  #12/18/2021 hypercoagulable work-up showed JAK2 V617F mutation negative, with reflex to other mutations CALR, MPL, JAK 2 Ex 12-15 mutations negative, negative anticardiolipin IgG and IgM antibodies, positive lupus anticoagulant, negative factor V Leiden mutation, negative prothrombin gene mutation, normal protein C activity, normal protein S antigen level. Patient was recommended to switch to Coumadin with INR goal of 2-3.  #03/15/2022, repeat lupus anticoagulant is persistently positive- + antiphospholipid syndrome.  Patient is currently on Lovenox 1 mg/kg twice daily.  admitted due to multifocal pneumonia, treated  with Influenza panel negative, COVID-negative, HIV negative, Complete course of antibiotics with Zithromax, Vantin   + HCV antibody positive, HCV RNA quantification not detected. HCV RT PCR not detected.   INTERVAL HISTORY Christopher Marsh is a 62 y.o. Marsh who has above history reviewed by me today presents for follow up visit for management of right lower extremity DVT and antiphospholipid syndrome, metastatic cholangiocarcinoma.  Oncology History  Cholangiocarcinoma (HCC)  10/30/2021 Imaging   CT chest wo contrast 1. Heterogeneous bilateral ground-glass disease, multilobar but worst in the right middle lobe, lingula and lower lobes. There is progression of the abnormality since 10/12/2022. Though pneumonia could produce this appearance, given persistence and fluctuating appearance since October, consider alternative entities such as hypersensitivity pneumonitis or idiopathic interstitial pneumonia. 2. Slightly enlarged pulmonary trunk, possible arterial hypertension 3. Redemonstrated large hypodense right hepatic lobe mass.   08/18/2022 Imaging   CT chest w contrast showed 1. Multifocal airspace disease in both lungs, left side greater than right. Findings are most compatible with multifocal pneumonia. 2. Multiple new hepatic lesions with enlarging lymph nodes in the upper abdomen and chest. Findings are concerning for metastatic disease. Based on the multifocal pneumonia, hepatic abscesses would also be in the differential diagnosis. Recommend further  characterization of the abdomen and pelvis with CT with IV contrast. 3. Enlargement of the main pulmonary artery could be associated with pulmonary hypertension. 4. Coronary artery calcifications. 5. Multinodular goiter. Patient has known thyroid nodules and previous thyroid ultrasound.    08/18/2022 Imaging   CT abdomen pelvis w contrast  Interval development of 6.5 x 5.2 cm heterogeneously enhancing mass in right hepatic lobe. This is  highly concerning for neoplasm or malignancy, and further evaluation with MRI is recommended.   Mildly enlarged periaortic adenopathy is noted, with the largest lymph node measuring 12 mm. Metastatic disease cannot be excluded. Mildly enlarged adenopathy is also noted in the gastrohepatic ligament, but this is unchanged compared to prior exam.   Stable bibasilar lung opacities are noted concerning for inflammation, atelectasis or possibly scarring.   Grossly stable multi-septated cystic lesion is seen involving upper pole of right kidney with peripheral calcifications compared to prior exam of 2019, most likely representing benign etiology.   Aortic Atherosclerosis    08/18/2022 - 08/21/2022 Hospital Admission   Admitted due to multifocal pneumonia, treated with Influenza panel negative, COVID-negative, HIV negative, Complete course of antibiotics with Zithromax, Vantin     08/19/2022 Imaging   MR abdomen w wo contrast  Marked caudate lobe hypertrophy, highly suspicious for hepatic cirrhosis.   Numerous hypervascular masses throughout the right hepatic lobe, highly suspicious for multifocal hepatocellular carcinoma. Recommend correlation with AFP and consider tissue sampling.   Mild abdominal lymphadenopathy, with differential diagnosis including metastatic disease and reactive lymphadenopathy in setting of cirrhosis.   Bilateral lower lobe infiltrates, as better demonstrated on recent CT.    08/19/2022 Tumor Marker   AFP 4.1   08/27/2022 Initial Diagnosis   Cholangiocarcinoma  -08/20/22 Liver mass biopsy showed poorly differentiated carcinoma Immunohistochemical stains show that the poorly differentiated carcinoma is positive for CK7 and MOC-31, suggestive of a poorly differentiated adenocarcinoma.  Immunohistochemical stains for CK20, CDX2, HepPar 1 and arginase are negative.  Immunostain for glypican-3 shows very focal  likely nonspecific labeling.  This immunoprofile is  nonspecific but can be compatible with a poorly differentiated primary cholangiocarcinoma in absence of any other lesions.   NGS showed BAP 1 mutation.  Case was presented at tumor board, I have also discussed with pathologist Dr.Rubinas,  IHC pattern is more consistent with adenocarcinoma, unlikely HCC [due to negative HepPar 1 and arginase], cholangiocarcinoma is favored, although this is a diagnosis of exclusion. Second opinion ar [facility]   08/27/2022 Cancer Staging   Staging form: Intrahepatic Bile Duct, AJCC 8th Edition - Clinical stage from 08/27/2022: Stage IV (cT2, cN1, cM1) - Signed by [name], MD on 09/13/2022 Stage prefix: Initial diagnosis   08/27/2022 Tumor Marker   CA 19.9  16 CEA 2.8   09/12/2022 Imaging   PET scan showed 1. Large right hepatic lobe mass is hypermetabolic and consistent with known cholangiocarcinoma. 2. Scattered borderline enlarged upper abdominal lymph nodes do not show any significant FDG uptake. 3. 4 hypermetabolic bone lesions consistent with metastatic disease. 4. Progressive diffuse airspace process in the left lung could be due to atypical pneumonia, pulmonary hemorrhage or left-sided aspiration. 5. Aortic atherosclerosis.  09/12/2022 I had a phone discussion with patient after his PET scan resulted.  Recommend systemic chemotherapy.  Patient would like to defer until his second opinion visit at [facility].   09/15/2022 Imaging   CT angio chest pulmonary embolism protocol showed 1. No evidence for pulmonary embolism. 2. Progression of bilateral multifocal patchy ground-glass and interstitial opacities,  left greater than right. Findings are concerning for multifocal pneumonia. 3. Air-fluid levels in the left lower lobe bronchi worrisome for aspiration. 4. Mediastinal and hilar lymphadenopathy has increased in size and number when compared to the prior study, likely reactive.5. Cardiomegaly. 6. Findings compatible with pulmonary artery hypertension.7.  Stable right liver mass and complex right renal cystic lesion. 8. Stable enlargement of the left thyroid.   09/15/2022 - 09/17/2022 Hospital Admission   Patient was admitted due to shortness of breath, CT showed progression of bilateral multifocal patchy groundglass and interstitial opacities.  Left greater than right.  Concerning for multifocal pneumonia.  Patient was started on broad-spectrum antibiotics with significant improvement.  He was discharged on oral antibiotics.   09/19/2022 Miscellaneous   Patient went to [facility] for second opinion.  He was seen by Shirlee Latch, who felt that the presentation is consistent with metastatic cholangiocarcinoma, and agrees with the plan of cisplatin/gemcitabine/durvalumab..    10/11/2022 -  Chemotherapy   Patient is on Treatment Plan : Cisplatin D1,8 + Gemcitabine + Durvalumab  D1,8 q21d x     10/12/2022 Imaging   CT chest w contrast 1. Bilateral airspace and ground-glass opacities have mildly decreased in amount and density when compared to the prior study. Findings are compatible with resolving multifocal pneumonia. Continued follow-up imaging recommended. 2. Stable enlarged heterogeneous left thyroid gland with 3.2 cm nodule. This can be further evaluated with dedicated ultrasound. 3. Stable hepatic and right renal lesions, incompletely evaluated.   Aortic Atherosclerosis (ICD10-I70.0).   12/11/2022 Imaging   PET showed  1. There is been mild increase in size of the tracer avid mass within right lobe of liver compatible with known cholangiocarcinoma. The degree of tracer uptake however is slightly decreased in the interval. 2. Multifocal tracer avid bone metastases are again noted. When compared with the previous exam there has been an mixed interval response to therapy. Specifically, there has been interval increase in size and FDG uptake associated with the right ischial tuberosity lesion and there is a new lesion within the posterior left iliac  bone. Decreased tracer uptake is associated with the left scapular lesion and there is stable tracer uptake associated with the right T1 transverse process lesion. 3. Persistent ground-glass and airspace densities within the left upper lobe and left lower lobe compatible with inflammatory or infectious process. Interval resolution of previous right lung ground-glass and airspace densities.    12/24/2022 Procedure   Medi port placement by Dr. Wyn Quaker   01/22/2023 Imaging   CT chest abdomen pelvis with contrast 1. Hypodense lesion of the right lobe of the liver, hepatic segment VII/VIII is slightly diminished in size, consistent with treatment response. 2. No evident change in additional smaller hypodense lesions scattered throughout the right lobe of the liver, these more difficult to see on prior noncontrast PET-CT. 3. Unchanged enlarged celiac axis, gastrohepatic ligament, and retroperitoneal lymph nodes. Unchanged prominent high right paratracheal lymph nodes. 4. Unchanged osseous metastases. 5. Slightly improved, although still extensive ground-glass airspace opacity primarily seen throughout the left lower lobe and lingula,nonspecific and infectious or inflammatory. 6. Coronary artery disease. 7. Emphysema.   02/11/2023 Imaging   Patient self decreased Lovenox dosage to 120mg  once daily.  Increased lower extremity swelling.   02/11/23 [place] bilateral lower extremity showed 1. Nonocclusive DVT within the popliteal veins bilaterally. 2. Occlusive DVT within the posterior tibial veins of the right calf.       02/13/2023 Imaging   CT chest Angiogram  1. Technically adequate  exam showing no acute pulmonary embolus. 2. Cardiomegaly. Coronary artery calcifications. 3. Bilateral pleural effusions. 4. Improved aeration of the RIGHT UPPER and LOWER lobes compared to prior study. 5. LEFT LOWER lobe and lingula airspace filling and atelectasis, slightly improved compared to prior study. 6. Cirrhotic  morphology of the liver. 7.7 centimeters mass in the RIGHT hepatic lobe. 7. Gastrohepatic ligament adenopathy. 8. A 7 millimeters sclerotic lesion in the UPPER sternum is suspicious for metastatic disease. 9. LEFT thyroid goiter.   02/13/2023 - 02/15/2023 Hospital Admission   A flutter with variable A-V block. Heart rate up to 140.  BNP 119.8 04/18: Echo no overt CHF - EF 50-55  Cardiology recs beta blocker to metoprolol succinate 50 mg bid, Torsemid 40mg  daily Adjusting Lovenox 14mg  BID.    03/03/2023 -  Chemotherapy   Futibatinib 20mg   03/12/23 Decreased to 16mg  daily    INTERVAL HISTORY DEMARQUISE WEATHERLEY is a 62 y.o. Marsh who has above history reviewed by me today presents for follow up visit for  Cholangiocarcinoma, antiphospholipid syndrome, thrombosis He tolerated  Futibatinib chemotherapy with mild difficulties.  + Fatigue, diarrhea is better controlled. Appetite is not good. He eats small portion of his meals.  Lost weight  He takes Lovenox 130mg  BID currently.  On Torsemide, Status post ablation for atrial fibrillation.  Tolerated procedure well.   Review of Systems  Constitutional:  Positive for fatigue. Negative for appetite change, chills and fever.  HENT:   Negative for hearing loss and voice change.   Eyes:  Positive for eye problems.  Respiratory:  Negative for chest tightness, cough and shortness of breath.   Cardiovascular:  Positive for leg swelling. Negative for chest pain.  Gastrointestinal:  Positive for diarrhea. Negative for abdominal distention, abdominal pain and blood in stool.  Endocrine: Negative for hot flashes.  Genitourinary:  Negative for difficulty urinating and frequency.   Musculoskeletal:  Negative for arthralgias.  Skin:  Negative for itching and rash.  Neurological:  Negative for extremity weakness.  Hematological:  Negative for adenopathy. Bruises/bleeds easily.  Psychiatric/Behavioral:  Negative for confusion.     MEDICAL HISTORY:  Past  Medical History:  Diagnosis Date   Antiphospholipid antibody syndrome (HCC)    Arthritis    Cancer (HCC)    Diabetes mellitus without complication (HCC)    DVT (deep venous thrombosis) (HCC)    Lt leg   Dyspnea    Elevated lipids    Erythrocytosis    Hyperlipidemia    Hypertension    Lower extremity edema    Multinodular thyroid    Post-thrombotic syndrome     SURGICAL HISTORY: Past Surgical History:  Procedure Laterality Date   CARDIOVERSION N/A 03/22/2023   Procedure: CARDIOVERSION;  Surgeon: Antonieta Iba, MD;  Location: [facility] ORS;  Service: Cardiovascular;  Laterality: N/A;   COLONOSCOPY WITH PROPOFOL N/A 09/23/2018   Per Dr. Norma Fredrickson, polyp, repeat in 3 yrs    EYE MUSCLE SURGERY     JOINT REPLACEMENT Left 07/22/2017   PORTA CATH INSERTION N/A 12/24/2022   Procedure: PORTA CATH INSERTION;  Surgeon: Annice Needy, MD;  Location: [facility] INVASIVE CV LAB;  Service: Cardiovascular;  Laterality: N/A;   TOTAL HIP ARTHROPLASTY Left 07/22/2017   Procedure: TOTAL HIP ARTHROPLASTY;  Surgeon: Donato Heinz, MD;  Location: [facility] ORS;  Service: Orthopedics;  Laterality: Left;    SOCIAL HISTORY: Social History   Socioeconomic History   Marital status: Married    Spouse name: Not on file   Number  of children: Not on file   Years of education: Not on file   Highest education level: Not on file  Occupational History   Not on file  Tobacco Use   Smoking status: Former    Packs/day: 0.50    Years: 6.00    Additional pack years: 0.00    Total pack years: 3.00    Types: Cigarettes    Quit date: 14    Years since quitting: 42.4   Smokeless tobacco: Never   Tobacco comments:    Pt states when he did smoke he smoked at most 1 ppd. ALS 09/26/2022  Vaping Use   Vaping Use: Never used  Substance and Sexual Activity   Alcohol use: Not Currently    Comment: occ   Drug use: No   Sexual activity: Not on file  Other Topics Concern   Not on file  Social History Narrative   Not on  file   Social Determinants of Health   Financial Resource Strain: Low Risk  (08/27/2022)   Overall Financial Resource Strain (CARDIA)    Difficulty of Paying Living Expenses: Not very hard  Food Insecurity: No Food Insecurity (02/18/2023)   Hunger Vital Sign    Worried About Running Out of Food in the Last Year: Never true    Ran Out of Food in the Last Year: Never true  Transportation Needs: No Transportation Needs (02/18/2023)   PRAPARE - Administrator, Civil Service (Medical): No    Lack of Transportation (Non-Medical): No  Physical Activity: Insufficiently Active (08/27/2022)   Exercise Vital Sign    Days of Exercise per Week: 3 days    Minutes of Exercise per Session: 30 min  Stress: Stress Concern Present (08/27/2022)   [employer] of Occupational Health - Occupational Stress Questionnaire    Feeling of Stress : Rather much  Social Connections: Socially Integrated (08/27/2022)   Social Connection and Isolation Panel [NHANES]    Frequency of Communication with Friends and Family: More than three times a week    Frequency of Social Gatherings with Friends and Family: More than three times a week    Attends Religious Services: 1 to 4 times per year    Active Member of [employer] or Organizations: Yes    Attends Banker Meetings: Never    Marital Status: Married  Catering manager Violence: Not At Risk (02/14/2023)   Humiliation, Afraid, Rape, and Kick questionnaire    Fear of Current or Ex-Partner: No    Emotionally Abused: No    Physically Abused: No    Sexually Abused: No    FAMILY HISTORY: Family History  Problem Relation Age of Onset   Breast cancer Mother    Emphysema Father    Colon cancer Maternal Grandmother     ALLERGIES:  has No Known Allergies.  MEDICATIONS:  Current Outpatient Medications  Medication Sig Dispense Refill   albuterol (VENTOLIN HFA) 108 (90 Base) MCG/ACT inhaler TAKE 2 PUFFS BY MOUTH EVERY 4 HOURS AS NEEDED 8.5 each  1   Calcium Carbonate 500 MG CHEW Chew 2 tablets (1,000 mg total) by mouth daily. 60 tablet 0   diphenoxylate-atropine (LOMOTIL) 2.5-0.025 MG tablet Take 1 tablet by mouth 4 (four) times daily as needed for diarrhea or loose stools. 120 tablet 3   enoxaparin (LOVENOX) 150 MG/ML injection INJECT 0.86 MLS (129 MG TOTAL) INTO THE SKIN EVERY 12 (TWELVE) HOURS. 60 mL 0   futibatinib, 16 mg daily dose, (LYTGOBI) 4 MG tablet  Take 4 tablets (16 mg total) by mouth daily. 112 tablet 0   glipiZIDE (GLUCOTROL) 10 MG tablet Take 1 tablet (10 mg total) by mouth 2 (two) times daily before a meal. 180 tablet 3   glucose blood test strip 1 each by Other route as needed for other. accu chek aviva plusUse as instructed     Lactulose 20 GM/30ML SOLN Take 30 mLs (20 g total) by mouth daily as needed (constipation.). 450 mL 0   lidocaine-prilocaine (EMLA) cream Apply 1 Application topically as needed. 30 g 5   LORazepam (ATIVAN) 0.5 MG tablet TAKE 1 TABLET (0.5 MG TOTAL) BY MOUTH EVERY 8 (EIGHT) HOURS AS NEEDED FOR ANXIETY (NAUSEA VOMITING). 60 tablet 0   magnesium chloride (SLOW-MAG) 64 MG TBEC SR tablet Take 1 tablet (64 mg total) by mouth 2 (two) times daily. 60 tablet 0   metFORMIN (GLUCOPHAGE) 500 MG tablet TAKE TWO TABLETS BY MOUTH EVERY MORNING WITH BREAKFAST 180 tablet 1   Metoprolol Tartrate 75 MG TABS Take 1 tablet (75 mg total) by mouth 2 (two) times daily. 60 tablet 6   MUCINEX D MAX STRENGTH [phone] MG TB12 Take 1,200 mg by mouth 2 (two) times daily as needed (for congestion or to loosen mucous in the chest).     ondansetron (ZOFRAN) 8 MG tablet Take 1 tablet (8 mg total) by mouth every 8 (eight) hours as needed for nausea or vomiting. Start on the third day after cisplatin. 90 tablet 1   potassium chloride (KLOR-CON) 10 MEQ tablet Take 1 tablet (10 mEq total) by mouth daily. 90 tablet 3   pravastatin (PRAVACHOL) 20 MG tablet Take 1 tablet (20 mg total) by mouth daily. 90 tablet 3   prochlorperazine  (COMPAZINE) 10 MG tablet TAKE 1 TABLET (10 MG TOTAL) BY MOUTH EVERY 6 HOURS AS NEEDED FOR NAUSEA AND VOMITING 90 tablet 1   tadalafil (CIALIS) 20 MG tablet Take 0.5-1 tablets (10-20 mg total) by mouth every other day as needed for erectile dysfunction. 20 tablet 11   torsemide (DEMADEX) 20 MG tablet Take 1 tablet (20 mg total) by mouth 2 (two) times daily as needed. 180 tablet 3   traZODone (DESYREL) 50 MG tablet Take 1 tablet (50 mg total) by mouth at bedtime as needed for sleep. 30 tablet 1   triamcinolone cream (KENALOG) 0.1 % Apply 1 Application topically 3 (three) times daily. (Patient taking differently: Apply 1 Application topically 3 (three) times daily as needed (for bilateral, below-the-knees skin irritation).) 453.6 g 1   dexamethasone (DECADRON) 4 MG tablet TAKE 2 TABLETS DAILY X 3 DAYS STARTING THE DAY AFTER CISPLATIN CHEMOTHERAPY. TAKE WITH FOOD. (Patient not taking: Reported on 02/20/2023) 30 tablet 1   No current facility-administered medications for this visit.     PHYSICAL EXAMINATION: ECOG PERFORMANCE STATUS: 1 - Symptomatic but completely ambulatory Vitals:   04/11/23 1417  BP: 136/78  Pulse: 66  Resp: 18  Temp: (!) 97.3 F (36.3 C)   Filed Weights   04/11/23 1417  Weight: 275 lb 11.2 oz (125.1 kg)    Physical Exam Constitutional:      General: He is not in acute distress. HENT:     Head: Normocephalic and atraumatic.  Eyes:     General: No scleral icterus. Cardiovascular:     Rate and Rhythm: Normal rate.  Pulmonary:     Effort: Pulmonary effort is normal. No respiratory distress.     Breath sounds: No wheezing.  Abdominal:  General: Bowel sounds are normal. There is no distension.  Musculoskeletal:        General: Normal range of motion.     Cervical back: Normal range of motion and neck supple.     Right lower leg: Edema present.     Left lower leg: Edema present.  Skin:    General: Skin is warm and dry.     Findings: No erythema or rash.   Neurological:     Mental Status: He is alert and oriented to person, place, and time. Mental status is at baseline.     Cranial Nerves: No cranial nerve deficit.  Psychiatric:        Mood and Affect: Mood normal.     LABORATORY DATA:  I have reviewed the data as listed    Latest Ref Rng & Units 04/11/2023    2:05 PM 04/03/2023    1:29 PM 03/27/2023   12:52 PM  CBC  WBC 4.0 - 10.5 K/uL 4.0  4.4  4.2   Hemoglobin 13.0 - 17.0 g/dL 40.9  81.1  91.4   Hematocrit 39.0 - 52.0 % 36.8  36.1  34.5   Platelets 150 - 400 K/uL 185  195  169       Latest Ref Rng & Units 04/11/2023    2:05 PM 04/03/2023    1:29 PM 03/27/2023   12:52 PM  CMP  Glucose 70 - 99 mg/dL 782  956  213   BUN 8 - 23 mg/dL 8  7  7    Creatinine 0.61 - 1.24 mg/dL 0.86  5.78  4.69   Sodium 135 - 145 mmol/L 136  134  132   Potassium 3.5 - 5.1 mmol/L 4.0  3.7  3.4   Chloride 98 - 111 mmol/L 104  100  100   CO2 22 - 32 mmol/L 23  23  21    Calcium 8.9 - 10.3 mg/dL 9.1  8.6  8.6   Total Protein 6.5 - 8.1 g/dL 6.2  6.1  6.1   Total Bilirubin 0.3 - 1.2 mg/dL 0.2  0.3  0.2   Alkaline Phos 38 - 126 U/L 61  54  51   AST 15 - 41 U/L 19  21  25    ALT 0 - 44 U/L 11  13  10      RADIOGRAPHIC STUDIES: I have personally reviewed the radiological images as listed and agreed with the findings in the report. No results found.

## 2023-04-11 NOTE — Assessment & Plan Note (Signed)
Continue slow Mag to twice daily  

## 2023-04-12 ENCOUNTER — Telehealth: Payer: Self-pay

## 2023-04-12 ENCOUNTER — Other Ambulatory Visit: Payer: Self-pay

## 2023-04-12 NOTE — Telephone Encounter (Signed)
-----   Message from [name], MD sent at 04/11/2023  8:44 PM EDT ----- Please arrange him to repeat phosphrous level in 1 week.

## 2023-04-12 NOTE — Telephone Encounter (Signed)
Called patient and informed him that his phosphrous is high and Dr. Cathie Hoops would like to recheck it next week.I told patient that I would have scheduling schedule and notify him.

## 2023-04-13 LAB — CEA: CEA: 4.6 ng/mL (ref 0.0–4.7)

## 2023-04-13 LAB — CANCER ANTIGEN 19-9: CA 19-9: 45 U/mL — ABNORMAL HIGH (ref 0–35)

## 2023-04-16 ENCOUNTER — Other Ambulatory Visit: Payer: Self-pay | Admitting: Oncology

## 2023-04-16 ENCOUNTER — Ambulatory Visit
Admission: RE | Admit: 2023-04-16 | Discharge: 2023-04-16 | Disposition: A | Discharge status: Home / Self Care | Payer: BC Managed Care – PPO | Source: Ambulatory Visit | Attending: Radiation Oncology | Admitting: Radiation Oncology

## 2023-04-16 ENCOUNTER — Inpatient Hospital Stay: Payer: BC Managed Care – PPO

## 2023-04-16 ENCOUNTER — Other Ambulatory Visit: Payer: Self-pay

## 2023-04-16 ENCOUNTER — Encounter: Payer: Self-pay | Admitting: Radiation Oncology

## 2023-04-16 ENCOUNTER — Other Ambulatory Visit (HOSPITAL_COMMUNITY): Payer: Self-pay

## 2023-04-16 VITALS — BP 108/64 | HR 72 | Temp 98.6°F | Resp 16 | Wt 277.0 lb

## 2023-04-16 DIAGNOSIS — C7951 Secondary malignant neoplasm of bone: Secondary | ICD-10-CM | POA: Insufficient documentation

## 2023-04-16 DIAGNOSIS — C221 Intrahepatic bile duct carcinoma: Secondary | ICD-10-CM | POA: Diagnosis not present

## 2023-04-16 NOTE — Progress Notes (Signed)
Radiation Oncology Follow up Note  Name: MAZI RAUSCH   Date:   04/16/2023 MRN:  [id number] DOB: [birth year]    This 62 y.o. male presents to the clinic today for evaluation of radiation therapy to his l left hip and patient with known stage IV cholangiocarcinoma  REFERRING PROVIDER: Nelwyn Salisbury, MD  HPI: Patient is a 62 year old male previously treated to the T1 transverse process as well as his right ischial tuberosity for palliation and patient with known stage IV cholangiocarcinoma.  Recently saw Dr. Ernest Pine who was concerned about his left ischium and his surgically repaired left hip.  On review there seems to be progressive lytic destruction of this region and he is now referred referred for palliative radiation to this area.  He states he is at not having any significant pain in this area he is ambulating well.  Other areas of palliation with radiation have responded well.Marland Kitchen  He is currently onFutibatinib which is causing some diarrhea and possible blurring of vision.  COMPLICATIONS OF TREATMENT: none  FOLLOW UP COMPLIANCE: keeps appointments   PHYSICAL EXAM:  BP 108/64   Pulse 72   Temp 98.6 F (37 C) (Tympanic)   Resp 16   Wt 277 lb (125.6 kg)   BMI 34.62 kg/m  Range of motion of his lower extremities does not elicit pain motor and sensory levels are equal and symmetric in lower extremities proprioception appears intact.  Well-developed well-nourished patient in NAD. HEENT reveals PERLA, EOMI, discs not visualized.  Oral cavity is clear. No oral mucosal lesions are identified. Neck is clear without evidence of cervical or supraclavicular adenopathy. Lungs are clear to A&P. Cardiac examination is essentially unremarkable with regular rate and rhythm without murmur rub or thrill. Abdomen is benign with no organomegaly or masses noted. Motor sensory and DTR levels are equal and symmetric in the upper and lower extremities. Cranial nerves II through XII are grossly intact.  Proprioception is intact. No peripheral adenopathy or edema is identified. No motor or sensory levels are noted. Crude visual fields are within normal range.  RADIOLOGY RESULTS: PET CT and CT scans reviewed compatible with above-stated findings  PLAN: This time elected ahead with palliative radiation therapy to his left hip including the area of the left ischium.  Would plan on delivering 30 Gray in 10 fractions.  Risks and benefits of treatment including possible skin reaction fatigue all were reviewed in detail with the patient.  I personally set up and ordered CT simulation for later this week.  I would like to take this opportunity to thank you for allowing me to participate in the care of your patient.Carmina Miller, MD

## 2023-04-16 NOTE — Progress Notes (Signed)
Nutrition Follow-up:  Patient with cholangiocarcinoma.  Patient receiving second line treatment of futibatinib.    Met with patient following radiation consult.  Patient reports that he will be starting 10 radiation treatments soon.  Appetite is still about the same.  Had a episode of feeling bloated on Sunday after big Father's Day meal.  Often has feeling of bloating.  Reports loose stool about 2 times a day with taking 2 lomotil.  Reports eating a halo orange today and 1/2 waffle with fruit and nuts at [employer].  Patient reports that he and wife have been reading labels and avoiding foods that contain phosphorus.   Medications: reviewed  Labs: phosphorus 6.7 on 6/13  Anthropometrics:   Weight 275 lb 11.2 oz on 6/13 287 lb 3.2 oz on 5/29 311 lb 3.2 oz on 4/8    NUTRITION DIAGNOSIS: Food and nutrition related knowledge deficit has improved    INTERVENTION:  Recommend starting nepro 1-2 times per day for added calories and protein (low in phosphorus).  Discussed with patient can purchase on [place].  Encouraged small frequent meals. Continue to read ingredient labels for phosphorus ingredients Continue antidiarrheal medications     MONITORING, EVALUATION, GOAL: weight trends, intake   NEXT VISIT: to be determined with radiation treatments  Bertine Schlottman B. Freida Busman, RD, LDN Registered Dietitian [phone]

## 2023-04-17 ENCOUNTER — Inpatient Hospital Stay: Payer: BC Managed Care – PPO

## 2023-04-17 ENCOUNTER — Other Ambulatory Visit: Payer: Self-pay

## 2023-04-17 ENCOUNTER — Other Ambulatory Visit (HOSPITAL_COMMUNITY): Payer: Self-pay

## 2023-04-17 DIAGNOSIS — C7951 Secondary malignant neoplasm of bone: Secondary | ICD-10-CM | POA: Diagnosis not present

## 2023-04-17 DIAGNOSIS — Z95828 Presence of other vascular implants and grafts: Secondary | ICD-10-CM

## 2023-04-17 LAB — PHOSPHORUS: Phosphorus: 5.6 mg/dL — ABNORMAL HIGH (ref 2.5–4.6)

## 2023-04-17 MED ORDER — FUTIBATINIB (16 MG DAILY DOSE) 4 MG PO TBPK
16.0000 mg | ORAL_TABLET | Freq: Every day | ORAL | 0 refills | Status: DC
  Filled 2023-04-19: qty 56, 14d supply, fill #0

## 2023-04-17 MED ORDER — SODIUM CHLORIDE 0.9% FLUSH
10.0000 mL | Freq: Once | INTRAVENOUS | Status: AC
  Administered 2023-04-17: 10 mL via INTRAVENOUS
  Filled 2023-04-17: qty 10

## 2023-04-17 MED ORDER — HEPARIN SOD (PORK) LOCK FLUSH 100 UNIT/ML IV SOLN
500.0000 [IU] | Freq: Once | INTRAVENOUS | Status: AC
  Administered 2023-04-17: 500 [IU] via INTRAVENOUS
  Filled 2023-04-17: qty 5

## 2023-04-18 ENCOUNTER — Ambulatory Visit
Admission: RE | Admit: 2023-04-18 | Discharge: 2023-04-18 | Disposition: A | Discharge status: Home / Self Care | Payer: BC Managed Care – PPO | Source: Ambulatory Visit | Attending: Radiation Oncology | Admitting: Radiation Oncology

## 2023-04-18 ENCOUNTER — Other Ambulatory Visit: Payer: Self-pay

## 2023-04-18 DIAGNOSIS — C7951 Secondary malignant neoplasm of bone: Secondary | ICD-10-CM | POA: Diagnosis not present

## 2023-04-18 DIAGNOSIS — C221 Intrahepatic bile duct carcinoma: Secondary | ICD-10-CM | POA: Diagnosis not present

## 2023-04-19 ENCOUNTER — Other Ambulatory Visit: Payer: Self-pay

## 2023-04-19 ENCOUNTER — Other Ambulatory Visit (HOSPITAL_COMMUNITY): Payer: Self-pay

## 2023-04-19 DIAGNOSIS — C221 Intrahepatic bile duct carcinoma: Secondary | ICD-10-CM | POA: Diagnosis not present

## 2023-04-19 DIAGNOSIS — C7951 Secondary malignant neoplasm of bone: Secondary | ICD-10-CM | POA: Diagnosis not present

## 2023-04-19 NOTE — Progress Notes (Unsigned)
Cardiology Office Note    Date:  04/24/2023   ID:  Christopher Marsh, DOB 18-May-1961, MRN [id number]  PCP:  Nelwyn Salisbury, MD  Cardiologist:  Julien Nordmann, MD  Electrophysiologist:  None   Chief Complaint: Follow-up  History of Present Illness:   Christopher Marsh is a 62 y.o. male with history of coronary artery calcification noted on CT imaging, persistent atrial flutter, HFpEF, cholangiocarcinoma with metastasis to the bone, recurrent DVT with antiphospholipid syndrome on Lovenox (failed Xarelto, not on Coumadin due to chemotherapy), multinodular goiter, DM2, HTN, lower extremity swelling with lymphedema, and anemia who presents for follow-up of cardioversion.  He was admitted to the hospital in 01/2023 with after presenting to oncology with heart rate of 140 bpm.  During admission, he was noted to be in atrial flutter with RVR and diagnosed with recurrent DVTs complicated by macrocytic anemia.  At that time, he was taking Lovenox once daily rather than twice daily secondary to epistaxis.  Echo during that admission demonstrated an EF of 50 to 55%, no regional wall motion abnormalities, normal RV systolic function and ventricular cavity size, mild mitral regurgitation, aortic valve sclerosis without evidence of stenosis, and an estimated right atrial pressure of 15 mmHg.  He was diuresed and rate controlled recommendation to continue therapeutic Lovenox.  High-sensitivity troponin negative.  He followed up with primary cardiologist in 02/2023 and remained in atrial flutter with controlled ventricular response.  He was adherent to Lovenox.  He underwent successful DCCV following 2 total shocks at 150 J and 200 J with conversion to sinus rhythm on 03/22/2023.  He comes in doing well from a cardiac perspective and is without symptoms of angina or cardiac decompensation.  No palpitations, presyncope, or syncope.  He does note some positional dizziness and ongoing fatigue.  He tries to stay active as  best he can.  Likes to play golf, though this is somewhat limited in this by fatigue and dizziness.  Adherent to Lovenox.  No falls, hematochezia, or melena.  Chronic lower extremity swelling stable.  Has a follow-up CT with the cancer center tomorrow.   Labs independently reviewed: 03/2023 - Hgb 11.6, PLT 185, potassium 4.0, BUN 8, serum creatinine 1.02, albumin 3.3, AST/ALT normal, magnesium 1.8 01/2023 - Free T4 normal, TSH 0.269, BNP 119, high-sensitivity troponin negative 07/2022 - TC 172, TG 66, HDL 74, LDL 84, A1c 5.8  Past Medical History:  Diagnosis Date   Antiphospholipid antibody syndrome (HCC)    Arthritis    Cancer (HCC)    Diabetes mellitus without complication (HCC)    DVT (deep venous thrombosis) (HCC)    Lt leg   Dyspnea    Elevated lipids    Erythrocytosis    Hyperlipidemia    Hypertension    Lower extremity edema    Multinodular thyroid    Post-thrombotic syndrome     Past Surgical History:  Procedure Laterality Date   CARDIOVERSION N/A 03/22/2023   Procedure: CARDIOVERSION;  Surgeon: Antonieta Iba, MD;  Location: [facility] ORS;  Service: Cardiovascular;  Laterality: N/A;   COLONOSCOPY WITH PROPOFOL N/A 09/23/2018   Per Dr. Norma Fredrickson, polyp, repeat in 3 yrs    EYE MUSCLE SURGERY     JOINT REPLACEMENT Left 07/22/2017   PORTA CATH INSERTION N/A 12/24/2022   Procedure: PORTA CATH INSERTION;  Surgeon: Annice Needy, MD;  Location: [facility] INVASIVE CV LAB;  Service: Cardiovascular;  Laterality: N/A;   TOTAL HIP ARTHROPLASTY Left 07/22/2017   Procedure: TOTAL HIP  ARTHROPLASTY;  Surgeon: Donato Heinz, MD;  Location: [facility] ORS;  Service: Orthopedics;  Laterality: Left;    Current Medications: Current Meds  Medication Sig   metoprolol tartrate (LOPRESSOR) 50 MG tablet Take 1 tablet (50 mg total) by mouth 2 (two) times daily.    Allergies:   Patient has no known allergies.   Social History   Socioeconomic History   Marital status: Married    Spouse name: Not on file    Number of children: Not on file   Years of education: Not on file   Highest education level: Not on file  Occupational History   Not on file  Tobacco Use   Smoking status: Former    Packs/day: 0.50    Years: 6.00    Additional pack years: 0.00    Total pack years: 3.00    Types: Cigarettes    Quit date: 10    Years since quitting: 42.5   Smokeless tobacco: Never   Tobacco comments:    Pt states when he did smoke he smoked at most 1 ppd. ALS 09/26/2022  Vaping Use   Vaping Use: Never used  Substance and Sexual Activity   Alcohol use: Not Currently    Comment: occ   Drug use: No   Sexual activity: Not on file  Other Topics Concern   Not on file  Social History Narrative   Not on file   Social Determinants of Health   Financial Resource Strain: Low Risk  (08/27/2022)   Overall Financial Resource Strain (CARDIA)    Difficulty of Paying Living Expenses: Not very hard  Food Insecurity: No Food Insecurity (02/18/2023)   Hunger Vital Sign    Worried About Running Out of Food in the Last Year: Never true    Ran Out of Food in the Last Year: Never true  Transportation Needs: No Transportation Needs (02/18/2023)   PRAPARE - Administrator, Civil Service (Medical): No    Lack of Transportation (Non-Medical): No  Physical Activity: Insufficiently Active (08/27/2022)   Exercise Vital Sign    Days of Exercise per Week: 3 days    Minutes of Exercise per Session: 30 min  Stress: Stress Concern Present (08/27/2022)   [employer] of Occupational Health - Occupational Stress Questionnaire    Feeling of Stress : Rather much  Social Connections: Socially Integrated (08/27/2022)   Social Connection and Isolation Panel [NHANES]    Frequency of Communication with Friends and Family: More than three times a week    Frequency of Social Gatherings with Friends and Family: More than three times a week    Attends Religious Services: 1 to 4 times per year    Active Member  of [employer] or Organizations: Yes    Attends Banker Meetings: Never    Marital Status: Married     Family History:  The patient's family history includes Breast cancer in his mother; Colon cancer in his maternal grandmother; Emphysema in his father.  ROS:   12-point review of systems is negative unless otherwise noted in the HPI.   EKGs/Labs/Other Studies Reviewed:    Studies reviewed were summarized above. The additional studies were reviewed today:  2D echo 02/13/2023: 1. Left ventricular ejection fraction, by estimation, is 50 to 55%. The  left ventricle has low normal function. The left ventricle has no regional  wall motion abnormalities. Left ventricular diastolic parameters are  indeterminate.   2. Right ventricular systolic function is normal. The right ventricular  size is normal. Tricuspid regurgitation signal is inadequate for assessing  PA pressure.   3. The mitral valve is normal in structure. Mild mitral valve  regurgitation. No evidence of mitral stenosis.   4. The aortic valve has an indeterminant number of cusps. Aortic valve  regurgitation is not visualized. Aortic valve sclerosis is present, with  no evidence of aortic valve stenosis.   5. The inferior vena cava is dilated in size with <50% respiratory  variability, suggesting right atrial pressure of 15 mmHg.  __________  2D echo 08/19/2022: 1. Left ventricular ejection fraction, by estimation, is 60 to 65%. The  left ventricle has normal function. The left ventricle has no regional  wall motion abnormalities. Left ventricular diastolic parameters were  normal.   2. Right ventricular systolic function is normal. The right ventricular  size is mildly enlarged. Tricuspid regurgitation signal is inadequate for  assessing PA pressure.   3. No valvular stenoses or regurgitation noted.    EKG:  EKG is ordered today.  The EKG ordered today demonstrates sinus bradycardia, 56 bpm, first-degree AV  block, nonspecific IVCD, no acute ST-T changes  Recent Labs: 02/13/2023: B Natriuretic Peptide 119.8; TSH 0.269 04/03/2023: Magnesium 1.8 04/11/2023: ALT 11; BUN 8; Creatinine 1.02; Hemoglobin 11.6; Platelet Count 185; Potassium 4.0; Sodium 136  Recent Lipid Panel    Component Value Date/Time   CHOL 172 08/08/2022 1055   TRIG 66.0 08/08/2022 1055   HDL 74.30 08/08/2022 1055   CHOLHDL 2 08/08/2022 1055   VLDL 13.2 08/08/2022 1055   LDLCALC 84 08/08/2022 1055    PHYSICAL EXAM:    VS:  BP 118/60 (BP Location: Left Arm, Patient Position: Sitting, Cuff Size: Large)   Pulse (!) 56   Ht 6\' 3"  (1.905 m)   Wt 276 lb 6.4 oz (125.4 kg)   SpO2 98%   BMI 34.55 kg/m   BMI: Body mass index is 34.55 kg/m.  Physical Exam Vitals reviewed.  Constitutional:      Appearance: He is well-developed.  HENT:     Head: Normocephalic and atraumatic.  Eyes:     General:        Right eye: No discharge.        Left eye: No discharge.  Neck:     Vascular: No JVD.  Cardiovascular:     Rate and Rhythm: Regular rhythm. Bradycardia present.     Heart sounds: Normal heart sounds, S1 normal and S2 normal. Heart sounds not distant. No midsystolic click and no opening snap. No murmur heard.    No friction rub.  Pulmonary:     Effort: Pulmonary effort is normal. No respiratory distress.     Breath sounds: Normal breath sounds. No decreased breath sounds, wheezing or rales.  Chest:     Chest wall: No tenderness.  Abdominal:     General: There is no distension.  Musculoskeletal:     Cervical back: Normal range of motion.     Right lower leg: Edema present.     Left lower leg: Edema present.  Skin:    General: Skin is warm and dry.     Nails: There is no clubbing.  Neurological:     Mental Status: He is alert and oriented to person, place, and time.  Psychiatric:        Speech: Speech normal.        Behavior: Behavior normal.        Thought Content: Thought content normal.  Judgment: Judgment  normal.     Wt Readings from Last 3 Encounters:  04/24/23 276 lb 6.4 oz (125.4 kg)  04/16/23 277 lb (125.6 kg)  04/11/23 275 lb 11.2 oz (125.1 kg)     ASSESSMENT & PLAN:   Persistent atrial flutter: Maintaining sinus rhythm with a mildly bradycardic rate following DCCV.  Decrease Lopressor to 50 mg twice daily.  CHADS2VASC at least 4 (CHF, HTN, DM, vascular disease).  He remains on twice daily dosing Lovenox given concern with Coumadin in the setting of chemotherapy with fluctuating diet/appetite.  No falls or symptoms concerning for bleeding.  Recent Hgb stable.  HFpEF: Previously exacerbated by anemia and atrial flutter.  Remains on as needed torsemide.  Not currently on SGLT2 inhibitor or MRA, deferred given comorbidities.  Coronary artery calcification/HLD: No symptoms suggestive of angina or cardiac decompensation.  LDL 84 in 07/2022.  Remains on pravastatin.  Heart healthy diet recommended.  Lymphedema: Not currently using compression hose for compression pumps in the setting of DVTs.  Leg elevation recommended.  Anemia: Improving.  Followed by hematology/oncology.    Disposition: F/u with Dr. Mariah Milling or an APP in 4 months.   Medication Adjustments/Labs and Tests Ordered: Current medicines are reviewed at length with the patient today.  Concerns regarding medicines are outlined above. Medication changes, Labs and Tests ordered today are summarized above and listed in the Patient Instructions accessible in Encounters.   SignedEula Listen, PA-C 04/24/2023 10:07 AM     [facility] [place] [address] [place], [place] [address] [phone]

## 2023-04-22 ENCOUNTER — Other Ambulatory Visit: Payer: Self-pay

## 2023-04-22 ENCOUNTER — Other Ambulatory Visit (HOSPITAL_COMMUNITY): Payer: Self-pay

## 2023-04-23 ENCOUNTER — Ambulatory Visit: Admission: RE | Admit: 2023-04-23 | Payer: BC Managed Care – PPO | Source: Ambulatory Visit

## 2023-04-23 DIAGNOSIS — Z510 Encounter for antineoplastic radiation therapy: Secondary | ICD-10-CM | POA: Diagnosis not present

## 2023-04-23 DIAGNOSIS — C7951 Secondary malignant neoplasm of bone: Secondary | ICD-10-CM | POA: Diagnosis not present

## 2023-04-23 DIAGNOSIS — C221 Intrahepatic bile duct carcinoma: Secondary | ICD-10-CM | POA: Diagnosis not present

## 2023-04-24 ENCOUNTER — Ambulatory Visit
Admission: RE | Admit: 2023-04-24 | Discharge: 2023-04-24 | Disposition: A | Discharge status: Home / Self Care | Payer: BC Managed Care – PPO | Source: Ambulatory Visit | Attending: Radiation Oncology | Admitting: Radiation Oncology

## 2023-04-24 ENCOUNTER — Other Ambulatory Visit: Payer: Self-pay

## 2023-04-24 ENCOUNTER — Ambulatory Visit: Payer: BC Managed Care – PPO | Attending: Physician Assistant | Admitting: Physician Assistant

## 2023-04-24 ENCOUNTER — Other Ambulatory Visit (HOSPITAL_COMMUNITY): Payer: Self-pay

## 2023-04-24 ENCOUNTER — Encounter: Payer: Self-pay | Admitting: Physician Assistant

## 2023-04-24 VITALS — BP 118/60 | HR 56 | Ht 75.0 in | Wt 276.4 lb

## 2023-04-24 DIAGNOSIS — I2510 Atherosclerotic heart disease of native coronary artery without angina pectoris: Secondary | ICD-10-CM

## 2023-04-24 DIAGNOSIS — I890 Lymphedema, not elsewhere classified: Secondary | ICD-10-CM

## 2023-04-24 DIAGNOSIS — E785 Hyperlipidemia, unspecified: Secondary | ICD-10-CM | POA: Diagnosis not present

## 2023-04-24 DIAGNOSIS — I4892 Unspecified atrial flutter: Secondary | ICD-10-CM

## 2023-04-24 DIAGNOSIS — C7951 Secondary malignant neoplasm of bone: Secondary | ICD-10-CM | POA: Diagnosis not present

## 2023-04-24 DIAGNOSIS — I5032 Chronic diastolic (congestive) heart failure: Secondary | ICD-10-CM | POA: Diagnosis not present

## 2023-04-24 DIAGNOSIS — I2584 Coronary atherosclerosis due to calcified coronary lesion: Secondary | ICD-10-CM

## 2023-04-24 LAB — RAD ONC ARIA SESSION SUMMARY
Course Elapsed Days: 0
Plan Fractions Treated to Date: 1
Plan Prescribed Dose Per Fraction: 3 Gy
Plan Total Fractions Prescribed: 10
Plan Total Prescribed Dose: 30 Gy
Reference Point Dosage Given to Date: 3 Gy
Reference Point Session Dosage Given: 3 Gy
Session Number: 1

## 2023-04-24 MED ORDER — METOPROLOL TARTRATE 50 MG PO TABS: 50.0000 mg | ORAL_TABLET | Freq: Two times a day (BID) | ORAL | 3 refills | Status: DC

## 2023-04-24 NOTE — Patient Instructions (Signed)
Medication Instructions:  Your physician has recommended you make the following change in your medication:   DECREASE Metoprolol tartrate to 50 mg twice a day  *If you need a refill on your cardiac medications before your next appointment, please call your pharmacy*   Lab Work: None  If you have labs (blood work) drawn today and your tests are completely normal, you will receive your results only by: MyChart Message (if you have MyChart) OR A paper copy in the mail If you have any lab test that is abnormal or we need to change your treatment, we will call you to review the results.   Testing/Procedures: None   Follow-Up: At [facility], you and your health needs are our priority.  As part of our continuing mission to provide you with exceptional heart care, we have created designated Provider Care Teams.  These Care Teams include your primary Cardiologist (physician) and Advanced Practice Providers (APPs -  Physician Assistants and Nurse Practitioners) who all work together to provide you with the care you need, when you need it.   Your next appointment:   4 month(s)  Provider:   Julien Nordmann, MD or Eula Listen, PA-C

## 2023-04-25 ENCOUNTER — Other Ambulatory Visit: Payer: Self-pay

## 2023-04-25 ENCOUNTER — Ambulatory Visit
Admission: RE | Admit: 2023-04-25 | Discharge: 2023-04-25 | Disposition: A | Discharge status: Home / Self Care | Payer: BC Managed Care – PPO | Source: Ambulatory Visit | Attending: Radiation Oncology | Admitting: Radiation Oncology

## 2023-04-25 ENCOUNTER — Ambulatory Visit
Admission: RE | Admit: 2023-04-25 | Discharge: 2023-04-25 | Disposition: A | Discharge status: Home / Self Care | Payer: BC Managed Care – PPO | Source: Ambulatory Visit | Attending: Oncology | Admitting: Oncology

## 2023-04-25 DIAGNOSIS — I700 Atherosclerosis of aorta: Secondary | ICD-10-CM | POA: Diagnosis not present

## 2023-04-25 DIAGNOSIS — C221 Intrahepatic bile duct carcinoma: Secondary | ICD-10-CM | POA: Diagnosis not present

## 2023-04-25 DIAGNOSIS — C7951 Secondary malignant neoplasm of bone: Secondary | ICD-10-CM | POA: Diagnosis not present

## 2023-04-25 DIAGNOSIS — R160 Hepatomegaly, not elsewhere classified: Secondary | ICD-10-CM | POA: Diagnosis not present

## 2023-04-25 LAB — RAD ONC ARIA SESSION SUMMARY
Course Elapsed Days: 1
Plan Fractions Treated to Date: 2
Plan Prescribed Dose Per Fraction: 3 Gy
Plan Total Fractions Prescribed: 10
Plan Total Prescribed Dose: 30 Gy
Reference Point Dosage Given to Date: 6 Gy
Reference Point Session Dosage Given: 3 Gy
Session Number: 2

## 2023-04-25 MED ORDER — HEPARIN SOD (PORK) LOCK FLUSH 100 UNIT/ML IV SOLN
500.0000 [IU] | Freq: Once | INTRAVENOUS | Status: AC
  Administered 2023-04-25: 500 [IU] via INTRAVENOUS
  Filled 2023-04-25: qty 5

## 2023-04-25 MED ORDER — IOHEXOL 300 MG/ML  SOLN
100.0000 mL | Freq: Once | INTRAMUSCULAR | Status: AC | PRN
  Administered 2023-04-25: 100 mL via INTRAVENOUS

## 2023-04-25 MED ORDER — HEPARIN SOD (PORK) LOCK FLUSH 100 UNIT/ML IV SOLN
INTRAVENOUS | Status: AC
  Filled 2023-04-25: qty 5

## 2023-04-26 ENCOUNTER — Encounter: Payer: Self-pay | Admitting: Oncology

## 2023-04-26 ENCOUNTER — Other Ambulatory Visit: Payer: Self-pay

## 2023-04-26 ENCOUNTER — Ambulatory Visit
Admission: RE | Admit: 2023-04-26 | Discharge: 2023-04-26 | Disposition: A | Discharge status: Home / Self Care | Payer: BC Managed Care – PPO | Source: Ambulatory Visit | Attending: Radiation Oncology | Admitting: Radiation Oncology

## 2023-04-26 DIAGNOSIS — C7951 Secondary malignant neoplasm of bone: Secondary | ICD-10-CM | POA: Diagnosis not present

## 2023-04-26 LAB — RAD ONC ARIA SESSION SUMMARY
Course Elapsed Days: 2
Plan Fractions Treated to Date: 3
Plan Prescribed Dose Per Fraction: 3 Gy
Plan Total Fractions Prescribed: 10
Plan Total Prescribed Dose: 30 Gy
Reference Point Dosage Given to Date: 9 Gy
Reference Point Session Dosage Given: 3 Gy
Session Number: 3

## 2023-04-29 ENCOUNTER — Ambulatory Visit: Payer: BC Managed Care – PPO

## 2023-04-30 ENCOUNTER — Other Ambulatory Visit: Payer: BC Managed Care – PPO

## 2023-04-30 ENCOUNTER — Other Ambulatory Visit: Payer: Self-pay

## 2023-04-30 ENCOUNTER — Inpatient Hospital Stay: Payer: BC Managed Care – PPO

## 2023-04-30 ENCOUNTER — Other Ambulatory Visit (HOSPITAL_COMMUNITY): Payer: Self-pay

## 2023-04-30 ENCOUNTER — Ambulatory Visit
Admission: RE | Admit: 2023-04-30 | Discharge: 2023-04-30 | Disposition: A | Discharge status: Home / Self Care | Payer: BC Managed Care – PPO | Source: Ambulatory Visit | Attending: Radiation Oncology | Admitting: Radiation Oncology

## 2023-04-30 DIAGNOSIS — C221 Intrahepatic bile duct carcinoma: Secondary | ICD-10-CM | POA: Diagnosis not present

## 2023-04-30 DIAGNOSIS — C7951 Secondary malignant neoplasm of bone: Secondary | ICD-10-CM | POA: Insufficient documentation

## 2023-04-30 DIAGNOSIS — Z452 Encounter for adjustment and management of vascular access device: Secondary | ICD-10-CM | POA: Insufficient documentation

## 2023-04-30 DIAGNOSIS — D6861 Antiphospholipid syndrome: Secondary | ICD-10-CM | POA: Diagnosis not present

## 2023-04-30 DIAGNOSIS — R197 Diarrhea, unspecified: Secondary | ICD-10-CM | POA: Insufficient documentation

## 2023-04-30 DIAGNOSIS — Z86718 Personal history of other venous thrombosis and embolism: Secondary | ICD-10-CM | POA: Diagnosis not present

## 2023-04-30 DIAGNOSIS — I4891 Unspecified atrial fibrillation: Secondary | ICD-10-CM | POA: Insufficient documentation

## 2023-04-30 DIAGNOSIS — R21 Rash and other nonspecific skin eruption: Secondary | ICD-10-CM | POA: Insufficient documentation

## 2023-04-30 DIAGNOSIS — Z510 Encounter for antineoplastic radiation therapy: Secondary | ICD-10-CM | POA: Insufficient documentation

## 2023-04-30 DIAGNOSIS — I429 Cardiomyopathy, unspecified: Secondary | ICD-10-CM | POA: Diagnosis not present

## 2023-04-30 DIAGNOSIS — Z5111 Encounter for antineoplastic chemotherapy: Secondary | ICD-10-CM | POA: Diagnosis not present

## 2023-04-30 LAB — RAD ONC ARIA SESSION SUMMARY
Course Elapsed Days: 6
Plan Fractions Treated to Date: 4
Plan Prescribed Dose Per Fraction: 3 Gy
Plan Total Fractions Prescribed: 10
Plan Total Prescribed Dose: 30 Gy
Reference Point Dosage Given to Date: 12 Gy
Reference Point Session Dosage Given: 3 Gy
Session Number: 4

## 2023-05-01 ENCOUNTER — Other Ambulatory Visit: Payer: Self-pay

## 2023-05-01 ENCOUNTER — Ambulatory Visit
Admission: RE | Admit: 2023-05-01 | Discharge: 2023-05-01 | Disposition: A | Discharge status: Home / Self Care | Payer: BC Managed Care – PPO | Source: Ambulatory Visit | Attending: Radiation Oncology | Admitting: Radiation Oncology

## 2023-05-01 DIAGNOSIS — C221 Intrahepatic bile duct carcinoma: Secondary | ICD-10-CM | POA: Diagnosis not present

## 2023-05-01 DIAGNOSIS — R197 Diarrhea, unspecified: Secondary | ICD-10-CM | POA: Diagnosis not present

## 2023-05-01 DIAGNOSIS — Z452 Encounter for adjustment and management of vascular access device: Secondary | ICD-10-CM | POA: Diagnosis not present

## 2023-05-01 DIAGNOSIS — Z86718 Personal history of other venous thrombosis and embolism: Secondary | ICD-10-CM | POA: Diagnosis not present

## 2023-05-01 DIAGNOSIS — Z510 Encounter for antineoplastic radiation therapy: Secondary | ICD-10-CM | POA: Diagnosis not present

## 2023-05-01 DIAGNOSIS — D6861 Antiphospholipid syndrome: Secondary | ICD-10-CM | POA: Diagnosis not present

## 2023-05-01 DIAGNOSIS — I4891 Unspecified atrial fibrillation: Secondary | ICD-10-CM | POA: Diagnosis not present

## 2023-05-01 DIAGNOSIS — Z5111 Encounter for antineoplastic chemotherapy: Secondary | ICD-10-CM | POA: Diagnosis not present

## 2023-05-01 DIAGNOSIS — I429 Cardiomyopathy, unspecified: Secondary | ICD-10-CM | POA: Diagnosis not present

## 2023-05-01 DIAGNOSIS — R21 Rash and other nonspecific skin eruption: Secondary | ICD-10-CM | POA: Diagnosis not present

## 2023-05-01 DIAGNOSIS — C7951 Secondary malignant neoplasm of bone: Secondary | ICD-10-CM | POA: Diagnosis not present

## 2023-05-01 LAB — RAD ONC ARIA SESSION SUMMARY
Course Elapsed Days: 7
Plan Fractions Treated to Date: 5
Plan Prescribed Dose Per Fraction: 3 Gy
Plan Total Fractions Prescribed: 10
Plan Total Prescribed Dose: 30 Gy
Reference Point Dosage Given to Date: 15 Gy
Reference Point Session Dosage Given: 3 Gy
Session Number: 5

## 2023-05-02 ENCOUNTER — Encounter: Payer: Self-pay | Admitting: Family Medicine

## 2023-05-03 ENCOUNTER — Telehealth: Payer: Self-pay

## 2023-05-03 ENCOUNTER — Inpatient Hospital Stay: Payer: BC Managed Care – PPO | Attending: Oncology | Admitting: Oncology

## 2023-05-03 ENCOUNTER — Inpatient Hospital Stay: Payer: BC Managed Care – PPO

## 2023-05-03 ENCOUNTER — Encounter: Payer: Self-pay | Admitting: Oncology

## 2023-05-03 ENCOUNTER — Ambulatory Visit
Admission: RE | Admit: 2023-05-03 | Discharge: 2023-05-03 | Disposition: A | Discharge status: Home / Self Care | Payer: BC Managed Care – PPO | Source: Ambulatory Visit | Attending: Radiation Oncology | Admitting: Radiation Oncology

## 2023-05-03 ENCOUNTER — Other Ambulatory Visit: Payer: Self-pay

## 2023-05-03 ENCOUNTER — Ambulatory Visit
Admission: RE | Admit: 2023-05-03 | Discharge: 2023-05-03 | Disposition: A | Discharge status: Home / Self Care | Payer: BC Managed Care – PPO | Source: Ambulatory Visit | Attending: Oncology | Admitting: Oncology

## 2023-05-03 VITALS — BP 114/52 | HR 53 | Temp 97.9°F | Resp 18 | Ht 75.0 in | Wt 271.0 lb

## 2023-05-03 DIAGNOSIS — C221 Intrahepatic bile duct carcinoma: Secondary | ICD-10-CM | POA: Diagnosis not present

## 2023-05-03 DIAGNOSIS — C7951 Secondary malignant neoplasm of bone: Secondary | ICD-10-CM

## 2023-05-03 DIAGNOSIS — Z5111 Encounter for antineoplastic chemotherapy: Secondary | ICD-10-CM | POA: Diagnosis not present

## 2023-05-03 DIAGNOSIS — I4891 Unspecified atrial fibrillation: Secondary | ICD-10-CM | POA: Diagnosis not present

## 2023-05-03 DIAGNOSIS — I4819 Other persistent atrial fibrillation: Secondary | ICD-10-CM

## 2023-05-03 DIAGNOSIS — M1611 Unilateral primary osteoarthritis, right hip: Secondary | ICD-10-CM | POA: Diagnosis not present

## 2023-05-03 DIAGNOSIS — Z95828 Presence of other vascular implants and grafts: Secondary | ICD-10-CM

## 2023-05-03 DIAGNOSIS — K521 Toxic gastroenteritis and colitis: Secondary | ICD-10-CM

## 2023-05-03 DIAGNOSIS — M79651 Pain in right thigh: Secondary | ICD-10-CM | POA: Diagnosis not present

## 2023-05-03 DIAGNOSIS — Z510 Encounter for antineoplastic radiation therapy: Secondary | ICD-10-CM | POA: Diagnosis not present

## 2023-05-03 DIAGNOSIS — M898X5 Other specified disorders of bone, thigh: Secondary | ICD-10-CM | POA: Diagnosis not present

## 2023-05-03 DIAGNOSIS — Z452 Encounter for adjustment and management of vascular access device: Secondary | ICD-10-CM | POA: Diagnosis not present

## 2023-05-03 DIAGNOSIS — Z86718 Personal history of other venous thrombosis and embolism: Secondary | ICD-10-CM | POA: Diagnosis not present

## 2023-05-03 DIAGNOSIS — R197 Diarrhea, unspecified: Secondary | ICD-10-CM | POA: Diagnosis not present

## 2023-05-03 DIAGNOSIS — D6861 Antiphospholipid syndrome: Secondary | ICD-10-CM | POA: Diagnosis not present

## 2023-05-03 DIAGNOSIS — R21 Rash and other nonspecific skin eruption: Secondary | ICD-10-CM | POA: Diagnosis not present

## 2023-05-03 DIAGNOSIS — I429 Cardiomyopathy, unspecified: Secondary | ICD-10-CM | POA: Diagnosis not present

## 2023-05-03 DIAGNOSIS — T451X5A Adverse effect of antineoplastic and immunosuppressive drugs, initial encounter: Secondary | ICD-10-CM

## 2023-05-03 LAB — CMP (CANCER CENTER ONLY)
ALT: 9 U/L (ref 0–44)
AST: 20 U/L (ref 15–41)
Albumin: 3 g/dL — ABNORMAL LOW (ref 3.5–5.0)
Alkaline Phosphatase: 81 U/L (ref 38–126)
Anion gap: 11 (ref 5–15)
BUN: 5 mg/dL — ABNORMAL LOW (ref 8–23)
CO2: 22 mmol/L (ref 22–32)
Calcium: 8.8 mg/dL — ABNORMAL LOW (ref 8.9–10.3)
Chloride: 103 mmol/L (ref 98–111)
Creatinine: 0.84 mg/dL (ref 0.61–1.24)
GFR, Estimated: >60 mL/min (ref >60)
Glucose, Bld: 66 mg/dL — ABNORMAL LOW (ref 70–99)
Potassium: 3.6 mmol/L (ref 3.5–5.1)
Sodium: 136 mmol/L (ref 135–145)
Total Bilirubin: 0.3 mg/dL (ref 0.3–1.2)
Total Protein: 5.8 g/dL — ABNORMAL LOW (ref 6.5–8.1)

## 2023-05-03 LAB — CBC WITH DIFFERENTIAL (CANCER CENTER ONLY)
Abs Immature Granulocytes: 0 10*3/uL (ref 0.00–0.07)
Basophils Absolute: 0 10*3/uL (ref 0.0–0.1)
Basophils Relative: 0 %
Eosinophils Absolute: 0 10*3/uL (ref 0.0–0.5)
Eosinophils Relative: 1 %
HCT: 35.4 % — ABNORMAL LOW (ref 39.0–52.0)
Hemoglobin: 11.3 g/dL — ABNORMAL LOW (ref 13.0–17.0)
Immature Granulocytes: 0 %
Lymphocytes Relative: 8 %
Lymphs Abs: 0.3 10*3/uL — ABNORMAL LOW (ref 0.7–4.0)
MCH: 28.7 pg (ref 26.0–34.0)
MCHC: 31.9 g/dL (ref 30.0–36.0)
MCV: 89.8 fL (ref 80.0–100.0)
Monocytes Absolute: 0.5 10*3/uL (ref 0.1–1.0)
Monocytes Relative: 14 %
Neutro Abs: 3 10*3/uL (ref 1.7–7.7)
Neutrophils Relative %: 77 %
Platelet Count: 188 10*3/uL (ref 150–400)
RBC: 3.94 MIL/uL — ABNORMAL LOW (ref 4.22–5.81)
RDW: 14.1 % (ref 11.5–15.5)
WBC Count: 3.9 10*3/uL — ABNORMAL LOW (ref 4.0–10.5)
nRBC: 0 % (ref 0.0–0.2)

## 2023-05-03 LAB — RAD ONC ARIA SESSION SUMMARY
Course Elapsed Days: 9
Plan Fractions Treated to Date: 6
Plan Prescribed Dose Per Fraction: 3 Gy
Plan Total Fractions Prescribed: 10
Plan Total Prescribed Dose: 30 Gy
Reference Point Dosage Given to Date: 18 Gy
Reference Point Session Dosage Given: 3 Gy
Session Number: 6

## 2023-05-03 LAB — MAGNESIUM: Magnesium: 2 mg/dL (ref 1.7–2.4)

## 2023-05-03 LAB — PHOSPHORUS: Phosphorus: 5.6 mg/dL — ABNORMAL HIGH (ref 2.5–4.6)

## 2023-05-03 MED ORDER — HEPARIN SOD (PORK) LOCK FLUSH 100 UNIT/ML IV SOLN
500.0000 [IU] | Freq: Once | INTRAVENOUS | Status: AC
  Administered 2023-05-03: 500 [IU] via INTRAVENOUS
  Filled 2023-05-03: qty 5

## 2023-05-03 MED ORDER — SODIUM CHLORIDE 0.9% FLUSH
10.0000 mL | Freq: Once | INTRAVENOUS | Status: AC
  Administered 2023-05-03: 10 mL via INTRAVENOUS
  Filled 2023-05-03: qty 10

## 2023-05-03 MED ORDER — OXYCODONE HCL 5 MG PO TABS: 5.0000 mg | ORAL_TABLET | ORAL | 0 refills | Status: DC | PRN

## 2023-05-03 NOTE — Telephone Encounter (Signed)
-----   Message from Reggy Eye sent at 05/03/2023  1:00 PM EDT ----- Regarding: PRIOR AUTH STARETED- COVER MY MEDS PRIOR AUTH STARETED- COVER MY MEDS sent to his chart. (Oxycodone)

## 2023-05-03 NOTE — Assessment & Plan Note (Signed)
Side effects from Futibatinib which is being stopped. Expect to normalize.

## 2023-05-03 NOTE — Assessment & Plan Note (Signed)
S/p palliative  radiation  Consider bisphosphonate after dental clearance is obtained. He will see Radonc again for evaluation of left hip lesion radiation due to concern of pathological fracture.   

## 2023-05-03 NOTE — Assessment & Plan Note (Signed)
Continue Lomotil QID PRN, stop Lactulose Expect to improve with the discontinuation of Futibatinib

## 2023-05-03 NOTE — Assessment & Plan Note (Signed)
#  Recurrent lower extremity DVT, secondary to antiphospholipid syndrome.-. Need  life time anticoagulation Continue  Lovenox 1 mg/kg twice daily, as of today, recommend 125 mg BID as he has lost weight.  Currently his platelet count is stable, off cytoreductive chemotherapy. Recommend patient to switch to Coumadin with INR goal of 2-3.  Referral to Coumadin clinic.  He has not established care yet.

## 2023-05-03 NOTE — Assessment & Plan Note (Signed)
Continue slow Mag to twice daily  

## 2023-05-03 NOTE — Telephone Encounter (Signed)
PA for Oxycodone submitted in covermymeds portal  Key: [id number]

## 2023-05-03 NOTE — Assessment & Plan Note (Signed)
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  03/03/23 2nd line Futibatinib 20mg  daily- dose reduced to 16mg   Continue Futibatinib 16mg  daily Recommend ophthalmology follow up for blurry vision.  CT showed interval progression  Recommend patient to stop Futibatinib.  Consider next line treatment with 5-FU contained regimen, if no clinical trials. He will see [facility] oncology next week.

## 2023-05-03 NOTE — Assessment & Plan Note (Signed)
And cardiomyopathy, follow up with cardiology Rate controlled. S/p ablation on 03/26/23  

## 2023-05-03 NOTE — Progress Notes (Signed)
Hematology/Oncology Progress note [phone] [phone]    CHIEF COMPLAINTS/REASON FOR VISIT:  Follow up for right lower extremity DVT, antiphospholipid syndrome, metastatic cholangiocarcinoma.  ASSESSMENT & PLAN:   Cancer Staging  Cholangiocarcinoma [facility]) Staging form: Intrahepatic Bile Duct, AJCC 8th Edition - Clinical stage from 08/27/2022: Stage IV (cT2, cN1, cM1) - Signed by [name], MD on 09/13/2022   Cholangiocarcinoma (HCC) Cholangiocarcinoma, NGS-Tempus liquid biopsy FGFR2 -ADK mutation, TMB 0,  S/p Gemcitabine, Cisplatin and Durvalumab x 6 , 01/2023 CT progression/enlarged liver lesions --.  03/03/23 2nd line Futibatinib 20mg  daily- dose reduced to 16mg   Continue Futibatinib 16mg  daily Recommend ophthalmology follow up for blurry vision.  CT showed interval progression  Recommend patient to stop Futibatinib.  Consider next line treatment with 5-FU contained regimen, if no clinical trials. He will see [facility] oncology next week.   Antiphospholipid syndrome (HCC) #Recurrent lower extremity DVT, secondary to antiphospholipid syndrome.-. Need  life time anticoagulation Continue  Lovenox 1 mg/kg twice daily, as of today, recommend 125 mg BID as he has lost weight.  Currently his platelet count is stable, off cytoreductive chemotherapy. Recommend patient to switch to Coumadin with INR goal of 2-3.  Referral to Coumadin clinic.  He has not established care yet.   Metastasis to bone [facility]) S/p palliative  radiation  Consider bisphosphonate after dental clearance is obtained. He will see Radonc again for evaluation of left hip lesion radiation due to concern of pathological fracture.    Hypomagnesemia Continue slow Mag to twice daily    Atrial fibrillation (HCC) And cardiomyopathy, follow up with cardiology Rate controlled. S/p ablation on 03/26/23   Hyperphosphatemia Side effects from Futibatinib which is being stopped. Expect to  normalize.   Chemotherapy induced diarrhea Continue Lomotil QID PRN, stop Lactulose Expect to improve with the discontinuation of Futibatinib    Orders Placed This Encounter  Procedures   DG FEMUR, MIN 2 VIEWS RIGHT    Standing Status:   Future    Number of Occurrences:   1    Standing Expiration Date:   05/02/2024    Order Specific Question:   Reason for Exam (SYMPTOM  OR DIAGNOSIS REQUIRED)    Answer:   right thigh pain    Order Specific Question:   Preferred imaging location?    Answer:   [facility]   Follow up 3 weeks  All questions were answered. The patient knows to call the clinic with any problems, questions or concerns.  [name], MD, PhD [facility] Hematology Oncology 05/03/2023    HISTORY OF PRESENTING ILLNESS:  Patient reports remote history of left lower extremity DVT in 2002.  He was initiated on Lovenox and bridged to Coumadin.  Patient took warfarin for 2 years before anticoagulation was stopped. 12/03/2021, patient presented to emergency room for evaluation of right lower extremity pain and swelling for about a week.  Started on the inner side of right thigh and migrated to the right calf. + Associated with swelling.  Denies any recent injury, hospitalization, surgery.  He first noticed the symptoms after playing basketball with his grandson.   12/03/2021, right lower extremity ultrasound showed occlusive DVT extending from the mid aspect of the femoral vein through the imaged tibial vein.  Age-indeterminate. Patient was started on Xarelto. He was referred to establish care with vascular surgeon and was seen by Sheppard Plumber on 12/06/2021.  Shared decision was made not to proceed with embolectomy.  Continue anticoagulation. Patient was referred to hematology oncology for further  evaluation.  Patient denies any family history of blood clots.  Denies any unintentional weight loss, fever or night sweats. 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  He sometimes stay in his car while waiting for next assessment appointment.  He reports the right lower extremity symptom has improved since the start of Xarelto.  No active bleeding events.  #12/18/2021 hypercoagulable work-up showed JAK2 V617F mutation negative, with reflex to other mutations CALR, MPL, JAK 2 Ex 12-15 mutations negative, negative anticardiolipin IgG and IgM antibodies, positive lupus anticoagulant, negative factor V Leiden mutation, negative prothrombin gene mutation, normal protein C activity, normal protein S antigen level. Patient was recommended to switch to Coumadin with INR goal of 2-3.  #03/15/2022, repeat lupus anticoagulant is persistently positive- + antiphospholipid syndrome.  Patient is currently on Lovenox 1 mg/kg twice daily.  admitted due to multifocal pneumonia, treated with Influenza panel negative, COVID-negative, HIV negative, Complete course of antibiotics with Zithromax, Vantin   + HCV antibody positive, HCV RNA quantification not detected. HCV RT PCR not detected.   INTERVAL HISTORY ABDULHAMID UPPAL is a 62 y.o. male who has above history reviewed by me today presents for follow up visit for management of right lower extremity DVT and antiphospholipid syndrome, metastatic cholangiocarcinoma.  Oncology History  Cholangiocarcinoma (HCC)  10/30/2021 Imaging   CT chest wo contrast 1. Heterogeneous bilateral ground-glass disease, multilobar but worst in the right middle lobe, lingula and lower lobes. There is progression of the abnormality since 10/12/2022. Though pneumonia could produce this appearance, given persistence and fluctuating appearance since October, consider alternative entities such as hypersensitivity pneumonitis or idiopathic interstitial pneumonia. 2. Slightly enlarged pulmonary trunk, possible arterial hypertension 3. Redemonstrated large hypodense right hepatic lobe  mass.   08/18/2022 Imaging   CT chest w contrast showed 1. Multifocal airspace disease in both lungs, left side greater than right. Findings are most compatible with multifocal pneumonia. 2. Multiple new hepatic lesions with enlarging lymph nodes in the upper abdomen and chest. Findings are concerning for metastatic disease. Based on the multifocal pneumonia, hepatic abscesses would also be in the differential diagnosis. Recommend further characterization of the abdomen and pelvis with CT with IV contrast. 3. Enlargement of the main pulmonary artery could be associated with pulmonary hypertension. 4. Coronary artery calcifications. 5. Multinodular goiter. Patient has known thyroid nodules and previous thyroid ultrasound.    08/18/2022 Imaging   CT abdomen pelvis w contrast  Interval development of 6.5 x 5.2 cm heterogeneously enhancing mass in right hepatic lobe. This is highly concerning for neoplasm or malignancy, and further evaluation with MRI is recommended.   Mildly enlarged periaortic adenopathy is noted, with the largest lymph node measuring 12 mm. Metastatic disease cannot be excluded. Mildly enlarged adenopathy is also noted in the gastrohepatic ligament, but this is unchanged compared to prior exam.   Stable bibasilar lung opacities are noted concerning for inflammation, atelectasis or possibly scarring.   Grossly stable multi-septated cystic lesion is seen involving upper pole of right kidney with peripheral calcifications compared to prior exam of 2019, most likely representing benign etiology.   Aortic Atherosclerosis    08/18/2022 - 08/21/2022 Hospital Admission   Admitted due to multifocal pneumonia, treated with Influenza panel negative, COVID-negative, HIV negative, Complete course of antibiotics with Zithromax, Vantin     08/19/2022 Imaging   MR abdomen w wo contrast  Marked caudate lobe hypertrophy, highly suspicious for hepatic  cirrhosis.   Numerous hypervascular  masses throughout the right hepatic lobe, highly suspicious for multifocal hepatocellular carcinoma. Recommend correlation with AFP and consider tissue sampling.   Mild abdominal lymphadenopathy, with differential diagnosis including metastatic disease and reactive lymphadenopathy in setting of cirrhosis.   Bilateral lower lobe infiltrates, as better demonstrated on recent CT.    08/19/2022 Tumor Marker   AFP 4.1   08/27/2022 Initial Diagnosis   Cholangiocarcinoma  -08/20/22 Liver mass biopsy showed poorly differentiated carcinoma Immunohistochemical stains show that the poorly differentiated carcinoma is positive for CK7 and MOC-31, suggestive of a poorly differentiated adenocarcinoma.  Immunohistochemical stains for CK20, CDX2, HepPar 1 and arginase are negative.  Immunostain for glypican-3 shows very focal  likely nonspecific labeling.  This immunoprofile is nonspecific but can be compatible with a poorly differentiated primary cholangiocarcinoma in absence of any other lesions.   NGS showed BAP 1 mutation.  Case was presented at tumor board, I have also discussed with pathologist Dr.Rubinas,  IHC pattern is more consistent with adenocarcinoma, unlikely HCC [due to negative HepPar 1 and arginase], cholangiocarcinoma is favored, although this is a diagnosis of exclusion. Second opinion ar [facility]   08/27/2022 Cancer Staging   Staging form: Intrahepatic Bile Duct, AJCC 8th Edition - Clinical stage from 08/27/2022: Stage IV (cT2, cN1, cM1) - Signed by [name], MD on 09/13/2022 Stage prefix: Initial diagnosis   08/27/2022 Tumor Marker   CA 19.9  16 CEA 2.8   09/12/2022 Imaging   PET scan showed 1. Large right hepatic lobe mass is hypermetabolic and consistent with known cholangiocarcinoma. 2. Scattered borderline enlarged upper abdominal lymph nodes do not show any significant FDG uptake. 3. 4 hypermetabolic bone lesions consistent with metastatic disease. 4. Progressive diffuse  airspace process in the left lung could be due to atypical pneumonia, pulmonary hemorrhage or left-sided aspiration. 5. Aortic atherosclerosis.  09/12/2022 I had a phone discussion with patient after his PET scan resulted.  Recommend systemic chemotherapy.  Patient would like to defer until his second opinion visit at [facility].   09/15/2022 Imaging   CT angio chest pulmonary embolism protocol showed 1. No evidence for pulmonary embolism. 2. Progression of bilateral multifocal patchy ground-glass and interstitial opacities, left greater than right. Findings are concerning for multifocal pneumonia. 3. Air-fluid levels in the left lower lobe bronchi worrisome for aspiration. 4. Mediastinal and hilar lymphadenopathy has increased in size and number when compared to the prior study, likely reactive.5. Cardiomegaly. 6. Findings compatible with pulmonary artery hypertension.7. Stable right liver mass and complex right renal cystic lesion. 8. Stable enlargement of the left thyroid.   09/15/2022 - 09/17/2022 Hospital Admission   Patient was admitted due to shortness of breath, CT showed progression of bilateral multifocal patchy groundglass and interstitial opacities.  Left greater than right.  Concerning for multifocal pneumonia.  Patient was started on broad-spectrum antibiotics with significant improvement.  He was discharged on oral antibiotics.   09/19/2022 Miscellaneous   Patient went to [facility] for second opinion.  He was seen by Shirlee Latch, who felt that the presentation is consistent with metastatic cholangiocarcinoma, and agrees with the plan of cisplatin/gemcitabine/durvalumab..    10/11/2022 -  Chemotherapy   Patient is on Treatment Plan : Cisplatin D1,8 + Gemcitabine + Durvalumab  D1,8 q21d x     10/12/2022 Imaging   CT chest w contrast 1. Bilateral airspace and ground-glass opacities have mildly decreased in amount and density when compared to the prior study. Findings are compatible with  resolving multifocal  pneumonia. Continued follow-up imaging recommended. 2. Stable enlarged heterogeneous left thyroid gland with 3.2 cm nodule. This can be further evaluated with dedicated ultrasound. 3. Stable hepatic and right renal lesions, incompletely evaluated.   Aortic Atherosclerosis (ICD10-I70.0).   12/11/2022 Imaging   PET showed  1. There is been mild increase in size of the tracer avid mass within right lobe of liver compatible with known cholangiocarcinoma. The degree of tracer uptake however is slightly decreased in the interval. 2. Multifocal tracer avid bone metastases are again noted. When compared with the previous exam there has been an mixed interval response to therapy. Specifically, there has been interval increase in size and FDG uptake associated with the right ischial tuberosity lesion and there is a new lesion within the posterior left iliac bone. Decreased tracer uptake is associated with the left scapular lesion and there is stable tracer uptake associated with the right T1 transverse process lesion. 3. Persistent ground-glass and airspace densities within the left upper lobe and left lower lobe compatible with inflammatory or infectious process. Interval resolution of previous right lung ground-glass and airspace densities.    12/24/2022 Procedure   Medi port placement by Dr. Wyn Quaker   01/22/2023 Imaging   CT chest abdomen pelvis with contrast 1. Hypodense lesion of the right lobe of the liver, hepatic segment VII/VIII is slightly diminished in size, consistent with treatment response. 2. No evident change in additional smaller hypodense lesions scattered throughout the right lobe of the liver, these more difficult to see on prior noncontrast PET-CT. 3. Unchanged enlarged celiac axis, gastrohepatic ligament, and retroperitoneal lymph nodes. Unchanged prominent high right paratracheal lymph nodes. 4. Unchanged osseous metastases. 5. Slightly improved, although still  extensive ground-glass airspace opacity primarily seen throughout the left lower lobe and lingula,nonspecific and infectious or inflammatory. 6. Coronary artery disease. 7. Emphysema.   02/11/2023 Imaging   Patient self decreased Lovenox dosage to 120mg  once daily.  Increased lower extremity swelling.   02/11/23 [place] bilateral lower extremity showed 1. Nonocclusive DVT within the popliteal veins bilaterally. 2. Occlusive DVT within the posterior tibial veins of the right calf.       02/13/2023 Imaging   CT chest Angiogram  1. Technically adequate exam showing no acute pulmonary embolus. 2. Cardiomegaly. Coronary artery calcifications. 3. Bilateral pleural effusions. 4. Improved aeration of the RIGHT UPPER and LOWER lobes compared to prior study. 5. LEFT LOWER lobe and lingula airspace filling and atelectasis, slightly improved compared to prior study. 6. Cirrhotic morphology of the liver. 7.7 centimeters mass in the RIGHT hepatic lobe. 7. Gastrohepatic ligament adenopathy. 8. A 7 millimeters sclerotic lesion in the UPPER sternum is suspicious for metastatic disease. 9. LEFT thyroid goiter.   02/13/2023 - 02/15/2023 Hospital Admission   A flutter with variable A-V block. Heart rate up to 140.  BNP 119.8 04/18: Echo no overt CHF - EF 50-55  Cardiology recs beta blocker to metoprolol succinate 50 mg bid, Torsemid 40mg  daily Adjusting Lovenox 14mg  BID.    03/03/2023 -  Chemotherapy   Futibatinib 20mg   03/12/23 Decreased to 16mg  daily    INTERVAL HISTORY DERAK FENSTERMAKER is a 62 y.o. male who has above history reviewed by me today presents for follow up visit for  Cholangiocarcinoma, antiphospholipid syndrome, thrombosis + Fatigue, diarrhea is better controlled. Appetite is not good. He eats small portion of his meals.  Lost 5 pounds since last visit  He takes Lovenox 130mg  BID currently.  On Torsemide,    Review of Systems  Constitutional:  Positive for fatigue. Negative for  appetite change, chills and fever.  HENT:   Negative for hearing loss and voice change.   Eyes:  Positive for eye problems.  Respiratory:  Negative for chest tightness, cough and shortness of breath.   Cardiovascular:  Positive for leg swelling. Negative for chest pain.  Gastrointestinal:  Positive for diarrhea. Negative for abdominal distention, abdominal pain and blood in stool.  Endocrine: Negative for hot flashes.  Genitourinary:  Negative for difficulty urinating and frequency.   Musculoskeletal:  Negative for arthralgias.  Skin:  Negative for itching and rash.  Neurological:  Negative for extremity weakness.  Hematological:  Negative for adenopathy. Bruises/bleeds easily.  Psychiatric/Behavioral:  Negative for confusion.     MEDICAL HISTORY:  Past Medical History:  Diagnosis Date   Antiphospholipid antibody syndrome (HCC)    Arthritis    Cancer (HCC)    Diabetes mellitus without complication (HCC)    DVT (deep venous thrombosis) (HCC)    Lt leg   Dyspnea    Elevated lipids    Erythrocytosis    Hyperlipidemia    Hypertension    Lower extremity edema    Multinodular thyroid    Post-thrombotic syndrome     SURGICAL HISTORY: Past Surgical History:  Procedure Laterality Date   CARDIOVERSION N/A 03/22/2023   Procedure: CARDIOVERSION;  Surgeon: Antonieta Iba, MD;  Location: [facility] ORS;  Service: Cardiovascular;  Laterality: N/A;   COLONOSCOPY WITH PROPOFOL N/A 09/23/2018   Per Dr. Norma Fredrickson, polyp, repeat in 3 yrs    EYE MUSCLE SURGERY     JOINT REPLACEMENT Left 07/22/2017   PORTA CATH INSERTION N/A 12/24/2022   Procedure: PORTA CATH INSERTION;  Surgeon: Annice Needy, MD;  Location: [facility] INVASIVE CV LAB;  Service: Cardiovascular;  Laterality: N/A;   TOTAL HIP ARTHROPLASTY Left 07/22/2017   Procedure: TOTAL HIP ARTHROPLASTY;  Surgeon: Donato Heinz, MD;  Location: [facility] ORS;  Service: Orthopedics;  Laterality: Left;    SOCIAL HISTORY: Social History   Socioeconomic  History   Marital status: Married    Spouse name: Not on file   Number of children: Not on file   Years of education: Not on file   Highest education level: Not on file  Occupational History   Not on file  Tobacco Use   Smoking status: Former    Packs/day: 0.50    Years: 6.00    Additional pack years: 0.00    Total pack years: 3.00    Types: Cigarettes    Quit date: 24    Years since quitting: 42.5   Smokeless tobacco: Never   Tobacco comments:    Pt states when he did smoke he smoked at most 1 ppd. ALS 09/26/2022  Vaping Use   Vaping Use: Never used  Substance and Sexual Activity   Alcohol use: Not Currently    Comment: occ   Drug use: No   Sexual activity: Not on file  Other Topics Concern   Not on file  Social History Narrative   Not on file   Social Determinants of Health   Financial Resource Strain: Low Risk  (08/27/2022)   Overall Financial Resource Strain (CARDIA)    Difficulty of Paying Living Expenses: Not very hard  Food Insecurity: No Food Insecurity (02/18/2023)   Hunger Vital Sign    Worried About Running Out of Food in the Last Year: Never true    Ran Out of Food in the Last Year: Never true  Transportation  Needs: No Transportation Needs (02/18/2023)   PRAPARE - Administrator, Civil Service (Medical): No    Lack of Transportation (Non-Medical): No  Physical Activity: Insufficiently Active (08/27/2022)   Exercise Vital Sign    Days of Exercise per Week: 3 days    Minutes of Exercise per Session: 30 min  Stress: Stress Concern Present (08/27/2022)   [employer] of Occupational Health - Occupational Stress Questionnaire    Feeling of Stress : Rather much  Social Connections: Socially Integrated (08/27/2022)   Social Connection and Isolation Panel [NHANES]    Frequency of Communication with Friends and Family: More than three times a week    Frequency of Social Gatherings with Friends and Family: More than three times a week     Attends Religious Services: 1 to 4 times per year    Active Member of [employer] or Organizations: Yes    Attends Banker Meetings: Never    Marital Status: Married  Catering manager Violence: Not At Risk (02/14/2023)   Humiliation, Afraid, Rape, and Kick questionnaire    Fear of Current or Ex-Partner: No    Emotionally Abused: No    Physically Abused: No    Sexually Abused: No    FAMILY HISTORY: Family History  Problem Relation Age of Onset   Breast cancer Mother    Emphysema Father    Colon cancer Maternal Grandmother     ALLERGIES:  has No Known Allergies.  MEDICATIONS:  Current Outpatient Medications  Medication Sig Dispense Refill   albuterol (VENTOLIN HFA) 108 (90 Base) MCG/ACT inhaler TAKE 2 PUFFS BY MOUTH EVERY 4 HOURS AS NEEDED 8.5 each 1   Calcium Carbonate 500 MG CHEW Chew 2 tablets (1,000 mg total) by mouth daily. 60 tablet 0   diphenoxylate-atropine (LOMOTIL) 2.5-0.025 MG tablet Take 1 tablet by mouth 4 (four) times daily as needed for diarrhea or loose stools. 120 tablet 3   enoxaparin (LOVENOX) 150 MG/ML injection INJECT 0.86 MLS (129 MG TOTAL) INTO THE SKIN EVERY 12 (TWELVE) HOURS. 60 mL 0   glipiZIDE (GLUCOTROL) 10 MG tablet Take 1 tablet (10 mg total) by mouth 2 (two) times daily before a meal. 180 tablet 3   glucose blood test strip 1 each by Other route as needed for other. accu chek aviva plusUse as instructed     Lactulose 20 GM/30ML SOLN Take 30 mLs (20 g total) by mouth daily as needed (constipation.). 450 mL 0   lidocaine-prilocaine (EMLA) cream Apply 1 Application topically as needed. 30 g 5   LORazepam (ATIVAN) 0.5 MG tablet TAKE 1 TABLET (0.5 MG TOTAL) BY MOUTH EVERY 8 (EIGHT) HOURS AS NEEDED FOR ANXIETY (NAUSEA VOMITING). 60 tablet 0   magnesium chloride (SLOW-MAG) 64 MG TBEC SR tablet Take 1 tablet (64 mg total) by mouth 2 (two) times daily. 60 tablet 0   metFORMIN (GLUCOPHAGE) 500 MG tablet TAKE TWO TABLETS BY MOUTH EVERY MORNING WITH BREAKFAST  180 tablet 1   metoprolol tartrate (LOPRESSOR) 50 MG tablet Take 1 tablet (50 mg total) by mouth 2 (two) times daily. 180 tablet 3   MUCINEX D MAX STRENGTH [phone] MG TB12 Take 1,200 mg by mouth 2 (two) times daily as needed (for congestion or to loosen mucous in the chest).     ondansetron (ZOFRAN) 8 MG tablet Take 1 tablet (8 mg total) by mouth every 8 (eight) hours as needed for nausea or vomiting. Start on the third day after cisplatin. 90 tablet 1  oxyCODONE (OXY IR/ROXICODONE) 5 MG immediate release tablet Take 1 tablet (5 mg total) by mouth every 4 (four) hours as needed for severe pain. 60 tablet 0   potassium chloride (KLOR-CON) 10 MEQ tablet Take 1 tablet (10 mEq total) by mouth daily. 90 tablet 3   pravastatin (PRAVACHOL) 20 MG tablet Take 1 tablet (20 mg total) by mouth daily. 90 tablet 3   prochlorperazine (COMPAZINE) 10 MG tablet TAKE 1 TABLET (10 MG TOTAL) BY MOUTH EVERY 6 HOURS AS NEEDED FOR NAUSEA AND VOMITING 90 tablet 1   tadalafil (CIALIS) 20 MG tablet Take 0.5-1 tablets (10-20 mg total) by mouth every other day as needed for erectile dysfunction. 20 tablet 11   torsemide (DEMADEX) 20 MG tablet Take 1 tablet (20 mg total) by mouth 2 (two) times daily as needed. 180 tablet 3   traZODone (DESYREL) 50 MG tablet Take 1 tablet (50 mg total) by mouth at bedtime as needed for sleep. 30 tablet 1   triamcinolone cream (KENALOG) 0.1 % Apply 1 Application topically 3 (three) times daily. (Patient taking differently: Apply 1 Application topically 3 (three) times daily as needed (for bilateral, below-the-knees skin irritation).) 453.6 g 1   No current facility-administered medications for this visit.     PHYSICAL EXAMINATION: ECOG PERFORMANCE STATUS: 1 - Symptomatic but completely ambulatory Vitals:   05/03/23 1111  BP: (!) 114/52  Pulse: (!) 53  Resp: 18  Temp: 97.9 F (36.6 C)  SpO2: 97%   Filed Weights   05/03/23 1111  Weight: 271 lb (122.9 kg)    Physical  Exam Constitutional:      General: He is not in acute distress. HENT:     Head: Normocephalic and atraumatic.  Eyes:     General: No scleral icterus. Cardiovascular:     Rate and Rhythm: Normal rate.  Pulmonary:     Effort: Pulmonary effort is normal. No respiratory distress.     Breath sounds: No wheezing.  Abdominal:     General: Bowel sounds are normal. There is no distension.  Musculoskeletal:        General: Normal range of motion.     Cervical back: Normal range of motion and neck supple.     Right lower leg: Edema present.     Left lower leg: Edema present.  Skin:    General: Skin is warm and dry.     Findings: No erythema or rash.  Neurological:     Mental Status: He is alert and oriented to person, place, and time. Mental status is at baseline.     Cranial Nerves: No cranial nerve deficit.  Psychiatric:        Mood and Affect: Mood normal.     LABORATORY DATA:  I have reviewed the data as listed    Latest Ref Rng & Units 05/03/2023   10:46 AM 04/11/2023    2:05 PM 04/03/2023    1:29 PM  CBC  WBC 4.0 - 10.5 K/uL 3.9  4.0  4.4   Hemoglobin 13.0 - 17.0 g/dL 82.9  56.2  13.0   Hematocrit 39.0 - 52.0 % 35.4  36.8  36.1   Platelets 150 - 400 K/uL 188  185  195       Latest Ref Rng & Units 05/03/2023   10:46 AM 04/11/2023    2:05 PM 04/03/2023    1:29 PM  CMP  Glucose 70 - 99 mg/dL 66  865  784   BUN 8 - 23 mg/dL  5  8  7    Creatinine 0.61 - 1.24 mg/dL 1.61  0.96  0.45   Sodium 135 - 145 mmol/L 136  136  134   Potassium 3.5 - 5.1 mmol/L 3.6  4.0  3.7   Chloride 98 - 111 mmol/L 103  104  100   CO2 22 - 32 mmol/L 22  23  23    Calcium 8.9 - 10.3 mg/dL 8.8  9.1  8.6   Total Protein 6.5 - 8.1 g/dL 5.8  6.2  6.1   Total Bilirubin 0.3 - 1.2 mg/dL 0.3  0.2  0.3   Alkaline Phos 38 - 126 U/L 81  61  54   AST 15 - 41 U/L 20  19  21    ALT 0 - 44 U/L 9  11  13      RADIOGRAPHIC STUDIES: I have personally reviewed the radiological images as listed and agreed with the findings  in the report. CT CHEST ABDOMEN PELVIS W CONTRAST  Result Date: 04/26/2023 CLINICAL DATA:  Cholangiocarcinoma restaging, restaging, chemotherapy and radiation * Tracking Code: BO * EXAM: CT CHEST, ABDOMEN, AND PELVIS WITH CONTRAST TECHNIQUE: Multidetector CT imaging of the chest, abdomen and pelvis was performed following the standard protocol during bolus administration of intravenous contrast. RADIATION DOSE REDUCTION: This exam was performed according to the departmental dose-optimization program which includes automated exposure control, adjustment of the mA and/or kV according to patient size and/or use of iterative reconstruction technique. CONTRAST:  OMNIPAQUE IOHEXOL 300 MG/ML  SOLN COMPARISON:  CT chest, 02/13/2023 CT chest abdomen pelvis, 01/22/2023 FINDINGS: CT CHEST FINDINGS Cardiovascular: Scattered aortic atherosclerosis. Aortic valve calcifications. Normal heart size. Left and right coronary artery calcifications. No pericardial effusion. Mediastinum/Nodes: Unchanged enlarged mediastinal lymph nodes, right paratracheal/paraesophageal node measuring 1.5 x 1.1 cm (series 2, image 16). Unchanged heterogeneous nodule of the left lobe of the thyroid measuring at least 3.9 x 3.4 cm. In the setting of significant comorbidities or limited life expectancy, no follow-up recommended (ref: J Am Coll Radiol. 2015 Feb;12(2): 143-50). Trachea, and esophagus demonstrate no significant findings. Lungs/Pleura: Similar, extensive irregular interstitial and ground-glass airspace opacity throughout the left lung base (series 4, image 131). Interlobular septal thickening and scarring throughout the right lung base. No pleural effusion or pneumothorax. Musculoskeletal: No chest wall abnormality. No acute osseous findings. Interval enlargement of a sclerotic lesion of the mid sternal body, measuring 1.2 cm, previously 0.7 cm (series 6, image 77). New lytic lesions of T4 and the inferior endplate of T9 (series 6,  image 76). CT ABDOMEN PELVIS FINDINGS Hepatobiliary: Slight interval enlargement of a heterogeneous, hypodense mass of the liver dome, measuring 7.1 x 6.8 cm, previously 6.8 x 6.3 cm when measured similarly (series 2, image 64). Increased size and conspicuity of small adjacent satellite lesions, for example inferiorly measuring 1.4 x 1.2 cm, previously no greater than 0.5 cm (series 2, image 71), and inferiorly and posteriorly in hepatic segment VI, measuring 3.4 x 3.2 cm not clearly visualized on prior examination (series 2, image 68). No gallstones, gallbladder wall thickening, or biliary dilatation. Pancreas: Unremarkable. No pancreatic ductal dilatation or surrounding inflammatory changes. Spleen: Normal in size without significant abnormality. Adrenals/Urinary Tract: Adrenal glands are unremarkable. Multiseptated, rim calcified cyst of the peripheral superior pole of the right kidney, as well as multiple simple, benign bilateral renal cortical cysts. Kidneys are otherwise normal, without renal calculi, solid lesion, or hydronephrosis. Bladder is unremarkable. Stomach/Bowel: Stomach is within normal limits. Appendix appears normal. No evidence of  bowel wall thickening, distention, or inflammatory changes. Vascular/Lymphatic: Aortic atherosclerosis. Interval enlargement of gastrohepatic and celiac axis lymph nodes, largest measuring 3.9 x 2.3 cm, previously 2.6 x 1.5 cm (series 2, image 64). Reproductive: Prostatomegaly. Other: No abdominal wall hernia or abnormality. New small volume loculated appearing perihepatic ascites (series 2, image 65). Significant interval increase in diffuse, fine peritoneal thickening and fat stranding (series 2, image 96). Musculoskeletal: No acute osseous findings. Status post left hip total arthroplasty IMPRESSION: 1. Slight interval enlargement of a heterogeneous, hypodense mass of the liver dome as well as additional adjacent satellite lesions. 2. Interval enlargement of  gastrohepatic and celiac axis lymph nodes. 3. Significant interval increase in diffuse, fine peritoneal thickening and fat stranding. New small volume loculated appearing perihepatic ascites. 4. Interval enlargement of a sclerotic lesion of the mid sternal body, as well as new lytic lesions of T4 and the inferior endplate of T9. 5. Constellation of findings is consistent with worsened cholangiocarcinoma, as well as nodal, peritoneal, and osseous metastatic disease. 6. Similar, extensive irregular interstitial and ground-glass airspace opacity throughout the left lung base. Interlobular septal thickening and scarring throughout the right lung base. Findings are most consistent with sequelae of prior infection or inflammation, however lymphangitic metastatic disease is not excluded. 7. Coronary artery disease. 8. Aortic valve calcifications. Correlate for echocardiographic evidence of aortic valve dysfunction. Aortic Atherosclerosis (ICD10-I70.0). Electronically Signed   By: Jearld Lesch M.D.   On: 04/26/2023 11:42

## 2023-05-06 ENCOUNTER — Inpatient Hospital Stay: Payer: BC Managed Care – PPO | Admitting: Oncology

## 2023-05-06 ENCOUNTER — Telehealth: Payer: Self-pay

## 2023-05-06 ENCOUNTER — Inpatient Hospital Stay: Payer: BC Managed Care – PPO

## 2023-05-06 ENCOUNTER — Other Ambulatory Visit: Payer: Self-pay

## 2023-05-06 ENCOUNTER — Ambulatory Visit
Admission: RE | Admit: 2023-05-06 | Discharge: 2023-05-06 | Disposition: A | Discharge status: Home / Self Care | Payer: BC Managed Care – PPO | Source: Ambulatory Visit | Attending: Radiation Oncology | Admitting: Radiation Oncology

## 2023-05-06 DIAGNOSIS — Z452 Encounter for adjustment and management of vascular access device: Secondary | ICD-10-CM | POA: Diagnosis not present

## 2023-05-06 DIAGNOSIS — C7951 Secondary malignant neoplasm of bone: Secondary | ICD-10-CM | POA: Diagnosis not present

## 2023-05-06 DIAGNOSIS — R197 Diarrhea, unspecified: Secondary | ICD-10-CM | POA: Diagnosis not present

## 2023-05-06 DIAGNOSIS — I429 Cardiomyopathy, unspecified: Secondary | ICD-10-CM | POA: Diagnosis not present

## 2023-05-06 DIAGNOSIS — C221 Intrahepatic bile duct carcinoma: Secondary | ICD-10-CM | POA: Diagnosis not present

## 2023-05-06 DIAGNOSIS — R21 Rash and other nonspecific skin eruption: Secondary | ICD-10-CM | POA: Diagnosis not present

## 2023-05-06 DIAGNOSIS — I4891 Unspecified atrial fibrillation: Secondary | ICD-10-CM | POA: Diagnosis not present

## 2023-05-06 DIAGNOSIS — Z86718 Personal history of other venous thrombosis and embolism: Secondary | ICD-10-CM | POA: Diagnosis not present

## 2023-05-06 DIAGNOSIS — D6861 Antiphospholipid syndrome: Secondary | ICD-10-CM | POA: Diagnosis not present

## 2023-05-06 DIAGNOSIS — Z510 Encounter for antineoplastic radiation therapy: Secondary | ICD-10-CM | POA: Diagnosis not present

## 2023-05-06 DIAGNOSIS — Z5111 Encounter for antineoplastic chemotherapy: Secondary | ICD-10-CM | POA: Diagnosis not present

## 2023-05-06 LAB — RAD ONC ARIA SESSION SUMMARY
Course Elapsed Days: 12
Plan Fractions Treated to Date: 7
Plan Prescribed Dose Per Fraction: 3 Gy
Plan Total Fractions Prescribed: 10
Plan Total Prescribed Dose: 30 Gy
Reference Point Dosage Given to Date: 21 Gy
Reference Point Session Dosage Given: 3 Gy
Session Number: 7

## 2023-05-06 MED ORDER — KETOCONAZOLE 2 % EX CREA: 1.0000 | TOPICAL_CREAM | Freq: Two times a day (BID) | CUTANEOUS | 5 refills | Status: DC | PRN

## 2023-05-06 MED ORDER — TRIAMCINOLONE ACETONIDE 0.1 % EX CREA: 1.0000 | TOPICAL_CREAM | Freq: Three times a day (TID) | CUTANEOUS | 5 refills | Status: DC

## 2023-05-06 NOTE — Telephone Encounter (Signed)
I refilled both of them  

## 2023-05-06 NOTE — Telephone Encounter (Signed)
called [facility] to follow up on coumadin clinic referral.   Spoke to [facility] and states she did not see request. Referral has been faxed to [phone].   ph: [phone]

## 2023-05-07 ENCOUNTER — Ambulatory Visit: Admission: RE | Admit: 2023-05-07 | Payer: BC Managed Care – PPO | Source: Ambulatory Visit

## 2023-05-07 ENCOUNTER — Other Ambulatory Visit: Payer: Self-pay

## 2023-05-07 ENCOUNTER — Inpatient Hospital Stay: Payer: BC Managed Care – PPO

## 2023-05-07 DIAGNOSIS — C7951 Secondary malignant neoplasm of bone: Secondary | ICD-10-CM | POA: Diagnosis not present

## 2023-05-07 DIAGNOSIS — Z452 Encounter for adjustment and management of vascular access device: Secondary | ICD-10-CM | POA: Diagnosis not present

## 2023-05-07 DIAGNOSIS — Z5111 Encounter for antineoplastic chemotherapy: Secondary | ICD-10-CM | POA: Diagnosis not present

## 2023-05-07 DIAGNOSIS — I429 Cardiomyopathy, unspecified: Secondary | ICD-10-CM | POA: Diagnosis not present

## 2023-05-07 DIAGNOSIS — R197 Diarrhea, unspecified: Secondary | ICD-10-CM | POA: Diagnosis not present

## 2023-05-07 DIAGNOSIS — R21 Rash and other nonspecific skin eruption: Secondary | ICD-10-CM | POA: Diagnosis not present

## 2023-05-07 DIAGNOSIS — C221 Intrahepatic bile duct carcinoma: Secondary | ICD-10-CM | POA: Diagnosis not present

## 2023-05-07 DIAGNOSIS — D6861 Antiphospholipid syndrome: Secondary | ICD-10-CM | POA: Diagnosis not present

## 2023-05-07 DIAGNOSIS — I4891 Unspecified atrial fibrillation: Secondary | ICD-10-CM | POA: Diagnosis not present

## 2023-05-07 DIAGNOSIS — Z86718 Personal history of other venous thrombosis and embolism: Secondary | ICD-10-CM | POA: Diagnosis not present

## 2023-05-07 DIAGNOSIS — Z510 Encounter for antineoplastic radiation therapy: Secondary | ICD-10-CM | POA: Diagnosis not present

## 2023-05-07 LAB — RAD ONC ARIA SESSION SUMMARY
Course Elapsed Days: 13
Plan Fractions Treated to Date: 8
Plan Prescribed Dose Per Fraction: 3 Gy
Plan Total Fractions Prescribed: 10
Plan Total Prescribed Dose: 30 Gy
Reference Point Dosage Given to Date: 24 Gy
Reference Point Session Dosage Given: 3 Gy
Session Number: 8

## 2023-05-07 NOTE — Progress Notes (Signed)
Nutrition  Patient was scheduled for nutrition visit following radiation today but patient left.  Called patient for nutrition follow-up.  No answer.  Left message with call back number.  Mavi Un B. Freida Busman, RD, LDN Registered Dietitian [phone]

## 2023-05-08 ENCOUNTER — Ambulatory Visit: Payer: BC Managed Care – PPO

## 2023-05-08 ENCOUNTER — Ambulatory Visit
Admission: RE | Admit: 2023-05-08 | Discharge: 2023-05-08 | Disposition: A | Discharge status: Home / Self Care | Payer: BC Managed Care – PPO | Source: Ambulatory Visit | Attending: Radiation Oncology | Admitting: Radiation Oncology

## 2023-05-08 ENCOUNTER — Other Ambulatory Visit: Payer: Self-pay

## 2023-05-08 DIAGNOSIS — R197 Diarrhea, unspecified: Secondary | ICD-10-CM | POA: Diagnosis not present

## 2023-05-08 DIAGNOSIS — I4891 Unspecified atrial fibrillation: Secondary | ICD-10-CM | POA: Diagnosis not present

## 2023-05-08 DIAGNOSIS — C221 Intrahepatic bile duct carcinoma: Secondary | ICD-10-CM | POA: Diagnosis not present

## 2023-05-08 DIAGNOSIS — Z510 Encounter for antineoplastic radiation therapy: Secondary | ICD-10-CM | POA: Diagnosis not present

## 2023-05-08 DIAGNOSIS — C7951 Secondary malignant neoplasm of bone: Secondary | ICD-10-CM | POA: Diagnosis not present

## 2023-05-08 DIAGNOSIS — Z452 Encounter for adjustment and management of vascular access device: Secondary | ICD-10-CM | POA: Diagnosis not present

## 2023-05-08 DIAGNOSIS — D6861 Antiphospholipid syndrome: Secondary | ICD-10-CM | POA: Diagnosis not present

## 2023-05-08 DIAGNOSIS — I429 Cardiomyopathy, unspecified: Secondary | ICD-10-CM | POA: Diagnosis not present

## 2023-05-08 DIAGNOSIS — R21 Rash and other nonspecific skin eruption: Secondary | ICD-10-CM | POA: Diagnosis not present

## 2023-05-08 DIAGNOSIS — Z86718 Personal history of other venous thrombosis and embolism: Secondary | ICD-10-CM | POA: Diagnosis not present

## 2023-05-08 DIAGNOSIS — Z5111 Encounter for antineoplastic chemotherapy: Secondary | ICD-10-CM | POA: Diagnosis not present

## 2023-05-08 LAB — RAD ONC ARIA SESSION SUMMARY
Course Elapsed Days: 14
Plan Fractions Treated to Date: 9
Plan Prescribed Dose Per Fraction: 3 Gy
Plan Total Fractions Prescribed: 10
Plan Total Prescribed Dose: 30 Gy
Reference Point Dosage Given to Date: 27 Gy
Reference Point Session Dosage Given: 3 Gy
Session Number: 9

## 2023-05-08 NOTE — Telephone Encounter (Signed)
Oxycodone denied. Appeal form completed and faxed

## 2023-05-09 ENCOUNTER — Other Ambulatory Visit: Payer: Self-pay | Admitting: Oncology

## 2023-05-09 ENCOUNTER — Ambulatory Visit
Admission: RE | Admit: 2023-05-09 | Discharge: 2023-05-09 | Disposition: A | Discharge status: Home / Self Care | Payer: BC Managed Care – PPO | Source: Ambulatory Visit | Attending: Radiation Oncology | Admitting: Radiation Oncology

## 2023-05-09 ENCOUNTER — Encounter: Payer: Self-pay | Admitting: Oncology

## 2023-05-09 ENCOUNTER — Other Ambulatory Visit: Payer: Self-pay

## 2023-05-09 DIAGNOSIS — C221 Intrahepatic bile duct carcinoma: Secondary | ICD-10-CM | POA: Diagnosis not present

## 2023-05-09 DIAGNOSIS — D6861 Antiphospholipid syndrome: Secondary | ICD-10-CM | POA: Diagnosis not present

## 2023-05-09 DIAGNOSIS — R197 Diarrhea, unspecified: Secondary | ICD-10-CM | POA: Diagnosis not present

## 2023-05-09 DIAGNOSIS — Z510 Encounter for antineoplastic radiation therapy: Secondary | ICD-10-CM | POA: Diagnosis not present

## 2023-05-09 DIAGNOSIS — Z86718 Personal history of other venous thrombosis and embolism: Secondary | ICD-10-CM | POA: Diagnosis not present

## 2023-05-09 DIAGNOSIS — I4891 Unspecified atrial fibrillation: Secondary | ICD-10-CM | POA: Diagnosis not present

## 2023-05-09 DIAGNOSIS — Z5111 Encounter for antineoplastic chemotherapy: Secondary | ICD-10-CM | POA: Diagnosis not present

## 2023-05-09 DIAGNOSIS — Z452 Encounter for adjustment and management of vascular access device: Secondary | ICD-10-CM | POA: Diagnosis not present

## 2023-05-09 DIAGNOSIS — C7951 Secondary malignant neoplasm of bone: Secondary | ICD-10-CM | POA: Diagnosis not present

## 2023-05-09 DIAGNOSIS — I429 Cardiomyopathy, unspecified: Secondary | ICD-10-CM | POA: Diagnosis not present

## 2023-05-09 DIAGNOSIS — R21 Rash and other nonspecific skin eruption: Secondary | ICD-10-CM | POA: Diagnosis not present

## 2023-05-09 LAB — RAD ONC ARIA SESSION SUMMARY
Course Elapsed Days: 15
Plan Fractions Treated to Date: 10
Plan Prescribed Dose Per Fraction: 3 Gy
Plan Total Fractions Prescribed: 10
Plan Total Prescribed Dose: 30 Gy
Reference Point Dosage Given to Date: 30 Gy
Reference Point Session Dosage Given: 3 Gy
Session Number: 10

## 2023-05-09 NOTE — Progress Notes (Signed)
DISCONTINUE OFF PATHWAY REGIMEN - Other   [id number]:Cisplatin IV D1,8 + Durvalumab 20 mg/kg IV D1 + Gemcitabine IV D1,8 q21 Days for up to 8 Cycles Followed by Durvalumab 20 mg/kg IV D1 q28 Days:   Cycles 1 through up to 8: A cycle is every 21 days:     Durvalumab      Gemcitabine      Cisplatin    Cycles 9 and beyond: A cycle is every 28 days:     Durvalumab   **Always confirm dose/schedule in your pharmacy ordering system**  REASON: Disease Progression PRIOR TREATMENT: Cisplatin IV D1,8 + Durvalumab 20 mg/kg IV D1 + Gemcitabine IV D1,8 q21 Days for up to 8 Cycles Followed by Durvalumab 20 mg/kg IV D1 q28 Days TREATMENT RESPONSE: Progressive Disease (PD)  START OFF PATHWAY REGIMEN - Other   [id number] (Leucovorin IV D1 + Fluorouracil IV D1/CIV D1,2 + Oxaliplatin IV D1) q14 Days:   A cycle is every 14 days:     Oxaliplatin      Leucovorin      Fluorouracil      Fluorouracil   **Always confirm dose/schedule in your pharmacy ordering system**  Patient Characteristics: Intent of Therapy: Non-Curative / Palliative Intent, Discussed with Patient

## 2023-05-10 ENCOUNTER — Encounter: Payer: Self-pay | Admitting: Oncology

## 2023-05-10 ENCOUNTER — Other Ambulatory Visit: Payer: Self-pay | Admitting: Oncology

## 2023-05-10 DIAGNOSIS — C221 Intrahepatic bile duct carcinoma: Secondary | ICD-10-CM

## 2023-05-11 ENCOUNTER — Encounter: Payer: Self-pay | Admitting: Oncology

## 2023-05-14 NOTE — Telephone Encounter (Signed)
Referral was sent to [facility], per pt request. Spoke to Rep at [facility] and they said in order for pt to be managed by coumadin clinic there he would need to re-establish care with PCP, which looks like he does not have any future appt. Will check with pt to see if he would like to re-establish care.

## 2023-05-14 NOTE — Telephone Encounter (Signed)
PA for Oxycodone Hcl 5mg  re-submitted and Denied  Key : [id number] PA Case ID: [id number]  Appeal form refaxed.

## 2023-05-15 ENCOUNTER — Inpatient Hospital Stay: Payer: BC Managed Care – PPO

## 2023-05-15 ENCOUNTER — Encounter: Payer: Self-pay | Admitting: Oncology

## 2023-05-15 ENCOUNTER — Other Ambulatory Visit: Payer: BC Managed Care – PPO

## 2023-05-15 ENCOUNTER — Ambulatory Visit: Payer: BC Managed Care – PPO | Admitting: Oncology

## 2023-05-15 ENCOUNTER — Other Ambulatory Visit: Payer: Self-pay | Admitting: Oncology

## 2023-05-15 ENCOUNTER — Ambulatory Visit: Payer: BC Managed Care – PPO

## 2023-05-15 ENCOUNTER — Inpatient Hospital Stay: Payer: BC Managed Care – PPO | Admitting: Oncology

## 2023-05-15 DIAGNOSIS — C221 Intrahepatic bile duct carcinoma: Secondary | ICD-10-CM

## 2023-05-15 NOTE — Assessment & Plan Note (Deleted)
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 [facility] 2nd opinion was reviewed.  Recommend to proceed with 3rd line treatment with FOLFOX

## 2023-05-17 ENCOUNTER — Inpatient Hospital Stay: Payer: BC Managed Care – PPO

## 2023-05-17 MED FILL — Dexamethasone Sodium Phosphate Inj 100 MG/10ML: INTRAMUSCULAR | Qty: 1 | Status: AC

## 2023-05-20 ENCOUNTER — Inpatient Hospital Stay: Payer: BC Managed Care – PPO

## 2023-05-20 ENCOUNTER — Encounter: Payer: Self-pay | Admitting: Oncology

## 2023-05-20 ENCOUNTER — Inpatient Hospital Stay (HOSPITAL_BASED_OUTPATIENT_CLINIC_OR_DEPARTMENT_OTHER): Payer: BC Managed Care – PPO | Admitting: Oncology

## 2023-05-20 ENCOUNTER — Telehealth: Payer: Self-pay

## 2023-05-20 VITALS — BP 105/51 | HR 61 | Temp 96.9°F | Resp 18 | Wt 264.8 lb

## 2023-05-20 DIAGNOSIS — D6861 Antiphospholipid syndrome: Secondary | ICD-10-CM

## 2023-05-20 DIAGNOSIS — Z5111 Encounter for antineoplastic chemotherapy: Secondary | ICD-10-CM | POA: Diagnosis not present

## 2023-05-20 DIAGNOSIS — I4891 Unspecified atrial fibrillation: Secondary | ICD-10-CM | POA: Diagnosis not present

## 2023-05-20 DIAGNOSIS — C221 Intrahepatic bile duct carcinoma: Secondary | ICD-10-CM

## 2023-05-20 DIAGNOSIS — R21 Rash and other nonspecific skin eruption: Secondary | ICD-10-CM

## 2023-05-20 DIAGNOSIS — R197 Diarrhea, unspecified: Secondary | ICD-10-CM | POA: Diagnosis not present

## 2023-05-20 DIAGNOSIS — I429 Cardiomyopathy, unspecified: Secondary | ICD-10-CM | POA: Diagnosis not present

## 2023-05-20 DIAGNOSIS — Z86718 Personal history of other venous thrombosis and embolism: Secondary | ICD-10-CM | POA: Diagnosis not present

## 2023-05-20 DIAGNOSIS — Z510 Encounter for antineoplastic radiation therapy: Secondary | ICD-10-CM | POA: Diagnosis not present

## 2023-05-20 DIAGNOSIS — R634 Abnormal weight loss: Secondary | ICD-10-CM

## 2023-05-20 DIAGNOSIS — C7951 Secondary malignant neoplasm of bone: Secondary | ICD-10-CM

## 2023-05-20 DIAGNOSIS — K521 Toxic gastroenteritis and colitis: Secondary | ICD-10-CM

## 2023-05-20 DIAGNOSIS — L589 Radiodermatitis, unspecified: Secondary | ICD-10-CM | POA: Insufficient documentation

## 2023-05-20 DIAGNOSIS — I4819 Other persistent atrial fibrillation: Secondary | ICD-10-CM

## 2023-05-20 DIAGNOSIS — Z452 Encounter for adjustment and management of vascular access device: Secondary | ICD-10-CM | POA: Diagnosis not present

## 2023-05-20 DIAGNOSIS — T451X5A Adverse effect of antineoplastic and immunosuppressive drugs, initial encounter: Secondary | ICD-10-CM

## 2023-05-20 LAB — CMP (CANCER CENTER ONLY)
ALT: 13 U/L (ref 0–44)
AST: 23 U/L (ref 15–41)
Albumin: 3.2 g/dL — ABNORMAL LOW (ref 3.5–5.0)
Alkaline Phosphatase: 72 U/L (ref 38–126)
Anion gap: 8 (ref 5–15)
BUN: 9 mg/dL (ref 8–23)
CO2: 24 mmol/L (ref 22–32)
Calcium: 8.9 mg/dL (ref 8.9–10.3)
Chloride: 104 mmol/L (ref 98–111)
Creatinine: 0.88 mg/dL (ref 0.61–1.24)
GFR, Estimated: >60 mL/min (ref >60)
Glucose, Bld: 101 mg/dL — ABNORMAL HIGH (ref 70–99)
Potassium: 4.2 mmol/L (ref 3.5–5.1)
Sodium: 136 mmol/L (ref 135–145)
Total Bilirubin: 0.4 mg/dL (ref 0.3–1.2)
Total Protein: 5.8 g/dL — ABNORMAL LOW (ref 6.5–8.1)

## 2023-05-20 LAB — CBC WITH DIFFERENTIAL (CANCER CENTER ONLY)
Abs Immature Granulocytes: 0.05 10*3/uL (ref 0.00–0.07)
Basophils Absolute: 0 10*3/uL (ref 0.0–0.1)
Basophils Relative: 1 %
Eosinophils Absolute: 0 10*3/uL (ref 0.0–0.5)
Eosinophils Relative: 1 %
HCT: 36.4 % — ABNORMAL LOW (ref 39.0–52.0)
Hemoglobin: 11.2 g/dL — ABNORMAL LOW (ref 13.0–17.0)
Immature Granulocytes: 1 %
Lymphocytes Relative: 6 %
Lymphs Abs: 0.3 10*3/uL — ABNORMAL LOW (ref 0.7–4.0)
MCH: 28.2 pg (ref 26.0–34.0)
MCHC: 30.8 g/dL (ref 30.0–36.0)
MCV: 91.7 fL (ref 80.0–100.0)
Monocytes Absolute: 0.5 10*3/uL (ref 0.1–1.0)
Monocytes Relative: 11 %
Neutro Abs: 3.5 10*3/uL (ref 1.7–7.7)
Neutrophils Relative %: 80 %
Platelet Count: 148 10*3/uL — ABNORMAL LOW (ref 150–400)
RBC: 3.97 MIL/uL — ABNORMAL LOW (ref 4.22–5.81)
RDW: 14.8 % (ref 11.5–15.5)
WBC Count: 4.4 10*3/uL (ref 4.0–10.5)
nRBC: 0 % (ref 0.0–0.2)

## 2023-05-20 MED ORDER — OXALIPLATIN CHEMO INJECTION 100 MG/20ML
85.0000 mg/m2 | Freq: Once | INTRAVENOUS | Status: AC
  Administered 2023-05-20: 200 mg via INTRAVENOUS
  Filled 2023-05-20: qty 40

## 2023-05-20 MED ORDER — PALONOSETRON HCL INJECTION 0.25 MG/5ML
0.2500 mg | Freq: Once | INTRAVENOUS | Status: AC
  Administered 2023-05-20: 0.25 mg via INTRAVENOUS
  Filled 2023-05-20: qty 5

## 2023-05-20 MED ORDER — DEXTROSE 5 % IV SOLN
Freq: Once | INTRAVENOUS | Status: AC
  Filled 2023-05-20: qty 250

## 2023-05-20 MED ORDER — LEUCOVORIN CALCIUM INJECTION 350 MG
392.0000 mg/m2 | Freq: Once | INTRAVENOUS | Status: AC
  Administered 2023-05-20: 1000 mg via INTRAVENOUS
  Filled 2023-05-20: qty 50

## 2023-05-20 MED ORDER — FLUOROURACIL CHEMO INJECTION 2.5 GM/50ML
400.0000 mg/m2 | Freq: Once | INTRAVENOUS | Status: AC
  Administered 2023-05-20: 1000 mg via INTRAVENOUS
  Filled 2023-05-20: qty 20

## 2023-05-20 MED ORDER — SODIUM CHLORIDE 0.9 % IV SOLN
10.0000 mg | Freq: Once | INTRAVENOUS | Status: AC
  Administered 2023-05-20: 10 mg via INTRAVENOUS
  Filled 2023-05-20: qty 10

## 2023-05-20 MED ORDER — SODIUM CHLORIDE 0.9 % IV SOLN
2400.0000 mg/m2 | INTRAVENOUS | Status: DC
  Administered 2023-05-20: 6050 mg via INTRAVENOUS
  Filled 2023-05-20: qty 121

## 2023-05-20 NOTE — Assessment & Plan Note (Signed)
Follow with nutritionist.

## 2023-05-20 NOTE — Assessment & Plan Note (Signed)
And cardiomyopathy, follow up with cardiology Rate controlled. S/p ablation on 03/26/23  

## 2023-05-20 NOTE — Assessment & Plan Note (Signed)
S/p palliative  radiation  Consider bisphosphonate after dental clearance is obtained. S/p left ischium.  radiation due to concern of pathological fracture.

## 2023-05-20 NOTE — Progress Notes (Addendum)
Hematology/Oncology Progress note [phone] [phone]    CHIEF COMPLAINTS/REASON FOR VISIT:  Follow up for right lower extremity DVT, antiphospholipid syndrome, metastatic cholangiocarcinoma.  ASSESSMENT & PLAN:   Cancer Staging  Cholangiocarcinoma [facility]) Staging form: Intrahepatic Bile Duct, AJCC 8th Edition - Clinical stage from 08/27/2022: Stage IV (cT2, cN1, cM1) - Signed by [name], MD on 09/13/2022   Cholangiocarcinoma (HCC) Cholangiocarcinoma, NGS-Tempus liquid biopsy FGFR2 -ADK mutation, TMB 0,  S/p Gemcitabine, Cisplatin and Durvalumab x 6 , 01/2023 CT progression/enlarged liver lesions --03/03/23 2nd line Futibatinib- 04/26/23 CT showed interval progression  Recommend patient to stop Futibatinib. [facility] 2nd opinion was reviewed.  Recommend to proceed with 3rd line treatment with FOLFOX  Labs are reviewed and discussed with patient. Proceed with FOLFOX  Antiphospholipid syndrome (HCC) #Recurrent lower extremity DVT, secondary to antiphospholipid syndrome.-. Need  life time anticoagulation Continue  Lovenox 1 mg/kg twice daily, as of today, recommend 120 mg BID as he has lost weight.  Currently his platelet count is stable, off cytoreductive chemotherapy. Recommend patient to switch to Coumadin with INR goal of 2-3.  Referral to Coumadin clinic.  He has not established care yet.   Metastasis to bone [facility]) S/p palliative  radiation  Consider bisphosphonate after dental clearance is obtained. S/p left ischium.  radiation due to concern of pathological fracture.    Hypomagnesemia Continue slow Mag to twice daily    Atrial fibrillation (HCC) And cardiomyopathy, follow up with cardiology Rate controlled. S/p ablation on 03/26/23   Chemotherapy induced diarrhea Continue Lomotil QID PRN, stop Lactulose    Skin rash Skin purple discoloration/ rash Differential includes radiation dermatitis, early stage pressure ulcer, cellulitis.   Recommend patient to consider seat cushion, rotation position, monitor the area.    Weight loss Follow with nutritionist.     Orders Placed This Encounter  Procedures   CBC (Cancer Center Only)    Standing Status:   Future    Standing Expiration Date:   05/19/2024   CMP (Cancer Center only)    Standing Status:   Future    Standing Expiration Date:   05/19/2024   Cancer antigen 19-9    Standing Status:   Future    Standing Expiration Date:   06/02/2024   CEA    Standing Status:   Future    Standing Expiration Date:   06/02/2024   CBC with Differential (Cancer Center Only)    Standing Status:   Future    Standing Expiration Date:   06/02/2024   CMP (Cancer Center only)    Standing Status:   Future    Standing Expiration Date:   06/02/2024   Follow up 3 weeks  All questions were answered. The patient knows to call the clinic with any problems, questions or concerns.  [name], MD, PhD [facility] Hematology Oncology 05/20/2023    HISTORY OF PRESENTING ILLNESS:  Patient reports remote history of left lower extremity DVT in 2002.  He was initiated on Lovenox and bridged to Coumadin.  Patient took warfarin for 2 years before anticoagulation was stopped. 12/03/2021, patient presented to emergency room for evaluation of right lower extremity pain and swelling for about a week.  Started on the inner side of right thigh and migrated to the right calf. + Associated with swelling.  Denies any recent injury, hospitalization, surgery.  He first noticed the symptoms after playing basketball with his grandson.   12/03/2021, right lower extremity ultrasound showed occlusive DVT extending from the mid aspect  of the femoral vein through the imaged tibial vein.  Age-indeterminate. Patient was started on Xarelto. He was referred to establish care with vascular surgeon and was seen by Christopher Marsh on 12/06/2021.  Shared decision was made not to proceed with embolectomy.  Continue anticoagulation. Patient was  referred to hematology oncology for further evaluation.  Patient denies any family history of blood clots.  Denies any unintentional weight loss, fever or night sweats. 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  He sometimes stay in his car while waiting for next assessment appointment.  He reports the right lower extremity symptom has improved since the start of Xarelto.  No active bleeding events.  #12/18/2021 hypercoagulable work-up showed JAK2 V617F mutation negative, with reflex to other mutations CALR, MPL, JAK 2 Ex 12-15 mutations negative, negative anticardiolipin IgG and IgM antibodies, positive lupus anticoagulant, negative factor V Leiden mutation, negative prothrombin gene mutation, normal protein C activity, normal protein S antigen level. Patient was recommended to switch to Coumadin with INR goal of 2-3.  #03/15/2022, repeat lupus anticoagulant is persistently positive- + antiphospholipid syndrome.  Patient is currently on Lovenox 1 mg/kg twice daily.  admitted due to multifocal pneumonia, treated with Influenza panel negative, COVID-negative, HIV negative, Complete course of antibiotics with Zithromax, Vantin   + HCV antibody positive, HCV RNA quantification not detected. HCV RT PCR not detected.   INTERVAL HISTORY Christopher Marsh is a 62 y.o. male who has above history reviewed by me today presents for follow up visit for management of right lower extremity DVT and antiphospholipid syndrome, metastatic cholangiocarcinoma.  Oncology History  Cholangiocarcinoma (HCC)  10/30/2021 Imaging   CT chest wo contrast 1. Heterogeneous bilateral ground-glass disease, multilobar but worst in the right middle lobe, lingula and lower lobes. There is progression of the abnormality since 10/12/2022. Though pneumonia could produce this appearance, given persistence and fluctuating appearance since October,  consider alternative entities such as hypersensitivity pneumonitis or idiopathic interstitial pneumonia. 2. Slightly enlarged pulmonary trunk, possible arterial hypertension 3. Redemonstrated large hypodense right hepatic lobe mass.   08/18/2022 Imaging   CT chest w contrast showed 1. Multifocal airspace disease in both lungs, left side greater than right. Findings are most compatible with multifocal pneumonia. 2. Multiple new hepatic lesions with enlarging lymph nodes in the upper abdomen and chest. Findings are concerning for metastatic disease. Based on the multifocal pneumonia, hepatic abscesses would also be in the differential diagnosis. Recommend further characterization of the abdomen and pelvis with CT with IV contrast. 3. Enlargement of the main pulmonary artery could be associated with pulmonary hypertension. 4. Coronary artery calcifications. 5. Multinodular goiter. Patient has known thyroid nodules and previous thyroid ultrasound.    08/18/2022 Imaging   CT abdomen pelvis w contrast  Interval development of 6.5 x 5.2 cm heterogeneously enhancing mass in right hepatic lobe. This is highly concerning for neoplasm or malignancy, and further evaluation with MRI is recommended.   Mildly enlarged periaortic adenopathy is noted, with the largest lymph node measuring 12 mm. Metastatic disease cannot be excluded. Mildly enlarged adenopathy is also noted in the gastrohepatic ligament, but this is unchanged compared to prior exam.   Stable bibasilar lung opacities are noted concerning for inflammation, atelectasis or possibly scarring.   Grossly stable multi-septated cystic lesion is seen involving upper pole of right kidney with peripheral calcifications compared to prior exam of 2019, most likely representing benign etiology.  Aortic Atherosclerosis    08/18/2022 - 08/21/2022 Hospital Admission   Admitted due to multifocal pneumonia, treated with Influenza panel negative,  COVID-negative, HIV negative, Complete course of antibiotics with Zithromax, Vantin     08/19/2022 Imaging   MR abdomen w wo contrast  Marked caudate lobe hypertrophy, highly suspicious for hepatic cirrhosis.   Numerous hypervascular masses throughout the right hepatic lobe, highly suspicious for multifocal hepatocellular carcinoma. Recommend correlation with AFP and consider tissue sampling.   Mild abdominal lymphadenopathy, with differential diagnosis including metastatic disease and reactive lymphadenopathy in setting of cirrhosis.   Bilateral lower lobe infiltrates, as better demonstrated on recent CT.    08/19/2022 Tumor Marker   AFP 4.1   08/27/2022 Initial Diagnosis   Cholangiocarcinoma  -08/20/22 Liver mass biopsy showed poorly differentiated carcinoma Immunohistochemical stains show that the poorly differentiated carcinoma is positive for CK7 and MOC-31, suggestive of a poorly differentiated adenocarcinoma.  Immunohistochemical stains for CK20, CDX2, HepPar 1 and arginase are negative.  Immunostain for glypican-3 shows very focal  likely nonspecific labeling.  This immunoprofile is nonspecific but can be compatible with a poorly differentiated primary cholangiocarcinoma in absence of any other lesions.   NGS showed BAP 1 mutation.  Case was presented at tumor board, I have also discussed with pathologist Dr.Rubinas,  IHC pattern is more consistent with adenocarcinoma, unlikely HCC [due to negative HepPar 1 and arginase], cholangiocarcinoma is favored, although this is a diagnosis of exclusion. Second opinion ar [facility]   08/27/2022 Cancer Staging   Staging form: Intrahepatic Bile Duct, AJCC 8th Edition - Clinical stage from 08/27/2022: Stage IV (cT2, cN1, cM1) - Signed by [name], MD on 09/13/2022 Stage prefix: Initial diagnosis   08/27/2022 Tumor Marker   CA 19.9  16 CEA 2.8   09/12/2022 Imaging   PET scan showed 1. Large right hepatic lobe mass is hypermetabolic and  consistent with known cholangiocarcinoma. 2. Scattered borderline enlarged upper abdominal lymph nodes do not show any significant FDG uptake. 3. 4 hypermetabolic bone lesions consistent with metastatic disease. 4. Progressive diffuse airspace process in the left lung could be due to atypical pneumonia, pulmonary hemorrhage or left-sided aspiration. 5. Aortic atherosclerosis.  09/12/2022 I had a phone discussion with patient after his PET scan resulted.  Recommend systemic chemotherapy.  Patient would like to defer until his second opinion visit at [facility].   09/15/2022 Imaging   CT angio chest pulmonary embolism protocol showed 1. No evidence for pulmonary embolism. 2. Progression of bilateral multifocal patchy ground-glass and interstitial opacities, left greater than right. Findings are concerning for multifocal pneumonia. 3. Air-fluid levels in the left lower lobe bronchi worrisome for aspiration. 4. Mediastinal and hilar lymphadenopathy has increased in size and number when compared to the prior study, likely reactive.5. Cardiomegaly. 6. Findings compatible with pulmonary artery hypertension.7. Stable right liver mass and complex right renal cystic lesion. 8. Stable enlargement of the left thyroid.   09/15/2022 - 09/17/2022 Hospital Admission   Patient was admitted due to shortness of breath, CT showed progression of bilateral multifocal patchy groundglass and interstitial opacities.  Left greater than right.  Concerning for multifocal pneumonia.  Patient was started on broad-spectrum antibiotics with significant improvement.  He was discharged on oral antibiotics.   09/19/2022 Miscellaneous   Patient went to [facility] for second opinion.  He was seen by Shirlee Latch, who felt that the presentation is consistent with metastatic cholangiocarcinoma, and agrees with the plan of cisplatin/gemcitabine/durvalumab..    10/11/2022 - 01/31/2023 Chemotherapy  Patient is on Treatment Plan : Cisplatin D1,8  + Gemcitabine + Durvalumab  D1,8 q21d x     10/12/2022 Imaging   CT chest w contrast 1. Bilateral airspace and ground-glass opacities have mildly decreased in amount and density when compared to the prior study. Findings are compatible with resolving multifocal pneumonia. Continued follow-up imaging recommended. 2. Stable enlarged heterogeneous left thyroid gland with 3.2 cm nodule. This can be further evaluated with dedicated ultrasound. 3. Stable hepatic and right renal lesions, incompletely evaluated.   Aortic Atherosclerosis (ICD10-I70.0).   12/11/2022 Imaging   PET showed  1. There is been mild increase in size of the tracer avid mass within right lobe of liver compatible with known cholangiocarcinoma. The degree of tracer uptake however is slightly decreased in the interval. 2. Multifocal tracer avid bone metastases are again noted. When compared with the previous exam there has been an mixed interval response to therapy. Specifically, there has been interval increase in size and FDG uptake associated with the right ischial tuberosity lesion and there is a new lesion within the posterior left iliac bone. Decreased tracer uptake is associated with the left scapular lesion and there is stable tracer uptake associated with the right T1 transverse process lesion. 3. Persistent ground-glass and airspace densities within the left upper lobe and left lower lobe compatible with inflammatory or infectious process. Interval resolution of previous right lung ground-glass and airspace densities.    12/24/2022 Procedure   Medi port placement by Dr. Wyn Quaker   01/22/2023 Imaging   CT chest abdomen pelvis with contrast 1. Hypodense lesion of the right lobe of the liver, hepatic segment VII/VIII is slightly diminished in size, consistent with treatment response. 2. No evident change in additional smaller hypodense lesions scattered throughout the right lobe of the liver, these more difficult to see on prior  noncontrast PET-CT. 3. Unchanged enlarged celiac axis, gastrohepatic ligament, and retroperitoneal lymph nodes. Unchanged prominent high right paratracheal lymph nodes. 4. Unchanged osseous metastases. 5. Slightly improved, although still extensive ground-glass airspace opacity primarily seen throughout the left lower lobe and lingula,nonspecific and infectious or inflammatory. 6. Coronary artery disease. 7. Emphysema.   02/11/2023 Imaging   Patient self decreased Lovenox dosage to 120mg  once daily.  Increased lower extremity swelling.   02/11/23 [place] bilateral lower extremity showed 1. Nonocclusive DVT within the popliteal veins bilaterally. 2. Occlusive DVT within the posterior tibial veins of the right calf.       02/13/2023 Imaging   CT chest Angiogram  1. Technically adequate exam showing no acute pulmonary embolus. 2. Cardiomegaly. Coronary artery calcifications. 3. Bilateral pleural effusions. 4. Improved aeration of the RIGHT UPPER and LOWER lobes compared to prior study. 5. LEFT LOWER lobe and lingula airspace filling and atelectasis, slightly improved compared to prior study. 6. Cirrhotic morphology of the liver. 7.7 centimeters mass in the RIGHT hepatic lobe. 7. Gastrohepatic ligament adenopathy. 8. A 7 millimeters sclerotic lesion in the UPPER sternum is suspicious for metastatic disease. 9. LEFT thyroid goiter.   02/13/2023 - 02/15/2023 Hospital Admission   A flutter with variable A-V block. Heart rate up to 140.  BNP 119.8 04/18: Echo no overt CHF - EF 50-55  Cardiology recs beta blocker to metoprolol succinate 50 mg bid, Torsemid 40mg  daily Adjusting Lovenox 14mg  BID.    03/03/2023 -  Chemotherapy   Futibatinib 20mg   03/12/23 Decreased to 16mg  daily   05/20/2023 -  Chemotherapy   Patient is on Treatment Plan : COLORECTAL FOLFOX q14d  INTERVAL HISTORY Christopher Marsh is a 62 y.o. male who has above history reviewed by me today presents for follow up visit for   Cholangiocarcinoma, antiphospholipid syndrome, thrombosis + Fatigue, diarrhea is better controlled. Appetite is good. Lost weight.  He has been to [facility] for 2nd opinion.  He takes Lovenox 125mg  BID currently.  On Torsemide, Left butt "bruising".   Review of Systems  Constitutional:  Positive for fatigue. Negative for appetite change, chills and fever.  HENT:   Negative for hearing loss and voice change.   Eyes:  Positive for eye problems.  Respiratory:  Negative for chest tightness, cough and shortness of breath.   Cardiovascular:  Positive for leg swelling. Negative for chest pain.  Gastrointestinal:  Positive for diarrhea. Negative for abdominal distention, abdominal pain and blood in stool.  Endocrine: Negative for hot flashes.  Genitourinary:  Negative for difficulty urinating and frequency.   Musculoskeletal:  Negative for arthralgias.  Skin:  Positive for rash. Negative for itching.  Neurological:  Negative for extremity weakness.  Hematological:  Negative for adenopathy. Bruises/bleeds easily.  Psychiatric/Behavioral:  Negative for confusion.     MEDICAL HISTORY:  Past Medical History:  Diagnosis Date   Antiphospholipid antibody syndrome (HCC)    Arthritis    Cancer (HCC)    Diabetes mellitus without complication (HCC)    DVT (deep venous thrombosis) (HCC)    Lt leg   Dyspnea    Elevated lipids    Erythrocytosis    Hyperlipidemia    Hypertension    Lower extremity edema    Multinodular thyroid    Post-thrombotic syndrome     SURGICAL HISTORY: Past Surgical History:  Procedure Laterality Date   CARDIOVERSION N/A 03/22/2023   Procedure: CARDIOVERSION;  Surgeon: Antonieta Iba, MD;  Location: [facility] ORS;  Service: Cardiovascular;  Laterality: N/A;   COLONOSCOPY WITH PROPOFOL N/A 09/23/2018   Per Dr. Norma Fredrickson, polyp, repeat in 3 yrs    EYE MUSCLE SURGERY     JOINT REPLACEMENT Left 07/22/2017   PORTA CATH INSERTION N/A 12/24/2022   Procedure: PORTA CATH  INSERTION;  Surgeon: Annice Needy, MD;  Location: [facility] INVASIVE CV LAB;  Service: Cardiovascular;  Laterality: N/A;   TOTAL HIP ARTHROPLASTY Left 07/22/2017   Procedure: TOTAL HIP ARTHROPLASTY;  Surgeon: Donato Heinz, MD;  Location: [facility] ORS;  Service: Orthopedics;  Laterality: Left;    SOCIAL HISTORY: Social History   Socioeconomic History   Marital status: Married    Spouse name: Not on file   Number of children: Not on file   Years of education: Not on file   Highest education level: Not on file  Occupational History   Not on file  Tobacco Use   Smoking status: Former    Current packs/day: 0.00    Average packs/day: 0.5 packs/day for 6.0 years (3.0 ttl pk-yrs)    Types: Cigarettes    Start date: 39    Quit date: 74    Years since quitting: 42.5   Smokeless tobacco: Never   Tobacco comments:    Pt states when he did smoke he smoked at most 1 ppd. ALS 09/26/2022  Vaping Use   Vaping status: Never Used  Substance and Sexual Activity   Alcohol use: Not Currently    Comment: occ   Drug use: No   Sexual activity: Not on file  Other Topics Concern   Not on file  Social History Narrative   Not on file   Social Determinants  of Health   Financial Resource Strain: Low Risk  (08/27/2022)   Overall Financial Resource Strain (CARDIA)    Difficulty of Paying Living Expenses: Not very hard  Food Insecurity: No Food Insecurity (02/18/2023)   Hunger Vital Sign    Worried About Running Out of Food in the Last Year: Never true    Ran Out of Food in the Last Year: Never true  Transportation Needs: No Transportation Needs (02/18/2023)   PRAPARE - Administrator, Civil Service (Medical): No    Lack of Transportation (Non-Medical): No  Physical Activity: Insufficiently Active (08/27/2022)   Exercise Vital Sign    Days of Exercise per Week: 3 days    Minutes of Exercise per Session: 30 min  Stress: Stress Concern Present (08/27/2022)   [employer] of  Occupational Health - Occupational Stress Questionnaire    Feeling of Stress : Rather much  Social Connections: Socially Integrated (08/27/2022)   Social Connection and Isolation Panel [NHANES]    Frequency of Communication with Friends and Family: More than three times a week    Frequency of Social Gatherings with Friends and Family: More than three times a week    Attends Religious Services: 1 to 4 times per year    Active Member of [employer] or Organizations: Yes    Attends Banker Meetings: Never    Marital Status: Married  Catering manager Violence: Not At Risk (02/14/2023)   Humiliation, Afraid, Rape, and Kick questionnaire    Fear of Current or Ex-Partner: No    Emotionally Abused: No    Physically Abused: No    Sexually Abused: No    FAMILY HISTORY: Family History  Problem Relation Age of Onset   Breast cancer Mother    Emphysema Father    Colon cancer Maternal Grandmother     ALLERGIES:  has No Known Allergies.  MEDICATIONS:  Current Outpatient Medications  Medication Sig Dispense Refill   albuterol (VENTOLIN HFA) 108 (90 Base) MCG/ACT inhaler TAKE 2 PUFFS BY MOUTH EVERY 4 HOURS AS NEEDED 8.5 each 1   Calcium Carbonate 500 MG CHEW Chew 2 tablets (1,000 mg total) by mouth daily. 60 tablet 0   diphenoxylate-atropine (LOMOTIL) 2.5-0.025 MG tablet Take 1 tablet by mouth 4 (four) times daily as needed for diarrhea or loose stools. 120 tablet 3   enoxaparin (LOVENOX) 150 MG/ML injection INJECT 0.86 MLS (129 MG TOTAL) INTO THE SKIN EVERY 12 (TWELVE) HOURS. 60 mL 0   glipiZIDE (GLUCOTROL) 10 MG tablet Take 1 tablet (10 mg total) by mouth 2 (two) times daily before a meal. 180 tablet 3   glucose blood test strip 1 each by Other route as needed for other. accu chek aviva plusUse as instructed     ketoconazole (NIZORAL) 2 % cream Apply 1 Application topically 2 (two) times daily as needed for irritation. 60 g 5   Lactulose 20 GM/30ML SOLN Take 30 mLs (20 g total) by mouth  daily as needed (constipation.). 450 mL 0   lidocaine-prilocaine (EMLA) cream Apply 1 Application topically as needed. 30 g 5   LORazepam (ATIVAN) 0.5 MG tablet TAKE 1 TABLET (0.5 MG TOTAL) BY MOUTH EVERY 8 (EIGHT) HOURS AS NEEDED FOR ANXIETY (NAUSEA VOMITING). 60 tablet 0   magnesium chloride (SLOW-MAG) 64 MG TBEC SR tablet Take 1 tablet (64 mg total) by mouth 2 (two) times daily. 60 tablet 0   metFORMIN (GLUCOPHAGE) 500 MG tablet TAKE TWO TABLETS BY MOUTH EVERY MORNING WITH BREAKFAST 180  tablet 1   metoprolol tartrate (LOPRESSOR) 50 MG tablet Take 1 tablet (50 mg total) by mouth 2 (two) times daily. 180 tablet 3   MUCINEX D MAX STRENGTH [phone] MG TB12 Take 1,200 mg by mouth 2 (two) times daily as needed (for congestion or to loosen mucous in the chest).     potassium chloride (KLOR-CON) 10 MEQ tablet Take 1 tablet (10 mEq total) by mouth daily. 90 tablet 3   pravastatin (PRAVACHOL) 20 MG tablet Take 1 tablet (20 mg total) by mouth daily. 90 tablet 3   tadalafil (CIALIS) 20 MG tablet Take 0.5-1 tablets (10-20 mg total) by mouth every other day as needed for erectile dysfunction. 20 tablet 11   torsemide (DEMADEX) 20 MG tablet Take 1 tablet (20 mg total) by mouth 2 (two) times daily as needed. 180 tablet 3   traZODone (DESYREL) 50 MG tablet Take 1 tablet (50 mg total) by mouth at bedtime as needed for sleep. 30 tablet 1   triamcinolone cream (KENALOG) 0.1 % Apply 1 Application topically 3 (three) times daily. 80 g 5   oxyCODONE (OXY IR/ROXICODONE) 5 MG immediate release tablet Take 1 tablet (5 mg total) by mouth every 4 (four) hours as needed for severe pain. (Patient not taking: Reported on 05/20/2023) 60 tablet 0   No current facility-administered medications for this visit.   Facility-Administered Medications Ordered in Other Visits  Medication Dose Route Frequency Provider Last Rate Last Admin   fluorouracil (ADRUCIL) 6,050 mg in sodium chloride 0.9 % 129 mL chemo infusion  2,400 mg/m2  (Order-Specific) Intravenous 1 day or 1 dose [name], MD       fluorouracil (ADRUCIL) chemo injection 1,000 mg  400 mg/m2 (Order-Specific) Intravenous Once [name], MD       leucovorin 1,000 mg in dextrose 5 % 250 mL infusion  392 mg/m2 (Treatment Plan Recorded) Intravenous Once [name], MD 150 mL/hr at 05/20/23 1116 1,000 mg at 05/20/23 1116   oxaliplatin (ELOXATIN) 200 mg in dextrose 5 % 500 mL chemo infusion  85 mg/m2 (Order-Specific) Intravenous Once [name], MD 270 mL/hr at 05/20/23 1115 200 mg at 05/20/23 1115     PHYSICAL EXAMINATION: ECOG PERFORMANCE STATUS: 1 - Symptomatic but completely ambulatory Vitals:   05/20/23 0843  BP: (!) 105/51  Pulse: 61  Resp: 18  Temp: (!) 96.9 F (36.1 C)   Filed Weights   05/20/23 0843  Weight: 264 lb 12.8 oz (120.1 kg)    Physical Exam Constitutional:      General: He is not in acute distress. HENT:     Head: Normocephalic and atraumatic.  Eyes:     General: No scleral icterus. Cardiovascular:     Rate and Rhythm: Normal rate.  Pulmonary:     Effort: Pulmonary effort is normal. No respiratory distress.     Breath sounds: No wheezing.  Abdominal:     General: Bowel sounds are normal. There is no distension.  Musculoskeletal:        General: Normal range of motion.     Cervical back: Normal range of motion and neck supple.     Right lower leg: Edema present.     Left lower leg: Edema present.  Skin:    General: Skin is warm and dry.     Findings: No erythema.     Comments: Left butt skin purple discoloration, mild tenderness. Skin is intact.   Neurological:     Mental Status: He is alert and oriented to  person, place, and time. Mental status is at baseline.     Cranial Nerves: No cranial nerve deficit.  Psychiatric:        Mood and Affect: Mood normal.     LABORATORY DATA:  I have reviewed the data as listed    Latest Ref Rng & Units 05/20/2023    7:51 AM 05/03/2023   10:46 AM 04/11/2023    2:05 PM  CBC  WBC 4.0 -  10.5 K/uL 4.4  3.9  4.0   Hemoglobin 13.0 - 17.0 g/dL 16.1  09.6  04.5   Hematocrit 39.0 - 52.0 % 36.4  35.4  36.8   Platelets 150 - 400 K/uL 148  188  185       Latest Ref Rng & Units 05/20/2023    7:51 AM 05/03/2023   10:46 AM 04/11/2023    2:05 PM  CMP  Glucose 70 - 99 mg/dL 409  66  811   BUN 8 - 23 mg/dL 9  5  8    Creatinine 0.61 - 1.24 mg/dL 9.14  7.82  9.56   Sodium 135 - 145 mmol/L 136  136  136   Potassium 3.5 - 5.1 mmol/L 4.2  3.6  4.0   Chloride 98 - 111 mmol/L 104  103  104   CO2 22 - 32 mmol/L 24  22  23    Calcium 8.9 - 10.3 mg/dL 8.9  8.8  9.1   Total Protein 6.5 - 8.1 g/dL 5.8  5.8  6.2   Total Bilirubin 0.3 - 1.2 mg/dL 0.4  0.3  0.2   Alkaline Phos 38 - 126 U/L 72  81  61   AST 15 - 41 U/L 23  20  19    ALT 0 - 44 U/L 13  9  11      RADIOGRAPHIC STUDIES: I have personally reviewed the radiological images as listed and agreed with the findings in the report. DG FEMUR, MIN 2 VIEWS RIGHT  Result Date: 05/08/2023 CLINICAL DATA:  Right thigh pain EXAM: RIGHT FEMUR 2 VIEWS COMPARISON:  PET CT 12/10/2022 FINDINGS: No fracture or malalignment. Vascular calcifications. Lytic lesion involving the right ischial tuberosity corresponding to history of known metastatic disease. Mild to moderate arthritis of right hip with joint space narrowing and osteophytes. IMPRESSION: 1. No acute osseous abnormality. 2. Lytic lesion involving the right ischial tuberosity corresponding to history of known metastatic disease. 3. Mild to moderate arthritis of the right hip. Electronically Signed   By: Jasmine Pang M.D.   On: 05/08/2023 19:59   CT CHEST ABDOMEN PELVIS W CONTRAST  Result Date: 04/26/2023 CLINICAL DATA:  Cholangiocarcinoma restaging, restaging, chemotherapy and radiation * Tracking Code: BO * EXAM: CT CHEST, ABDOMEN, AND PELVIS WITH CONTRAST TECHNIQUE: Multidetector CT imaging of the chest, abdomen and pelvis was performed following the standard protocol during bolus administration of  intravenous contrast. RADIATION DOSE REDUCTION: This exam was performed according to the departmental dose-optimization program which includes automated exposure control, adjustment of the mA and/or kV according to patient size and/or use of iterative reconstruction technique. CONTRAST:  OMNIPAQUE IOHEXOL 300 MG/ML  SOLN COMPARISON:  CT chest, 02/13/2023 CT chest abdomen pelvis, 01/22/2023 FINDINGS: CT CHEST FINDINGS Cardiovascular: Scattered aortic atherosclerosis. Aortic valve calcifications. Normal heart size. Left and right coronary artery calcifications. No pericardial effusion. Mediastinum/Nodes: Unchanged enlarged mediastinal lymph nodes, right paratracheal/paraesophageal node measuring 1.5 x 1.1 cm (series 2, image 16). Unchanged heterogeneous nodule of the left lobe of the thyroid measuring at  least 3.9 x 3.4 cm. In the setting of significant comorbidities or limited life expectancy, no follow-up recommended (ref: J Am Coll Radiol. 2015 Feb;12(2): 143-50). Trachea, and esophagus demonstrate no significant findings. Lungs/Pleura: Similar, extensive irregular interstitial and ground-glass airspace opacity throughout the left lung base (series 4, image 131). Interlobular septal thickening and scarring throughout the right lung base. No pleural effusion or pneumothorax. Musculoskeletal: No chest wall abnormality. No acute osseous findings. Interval enlargement of a sclerotic lesion of the mid sternal body, measuring 1.2 cm, previously 0.7 cm (series 6, image 77). New lytic lesions of T4 and the inferior endplate of T9 (series 6, image 76). CT ABDOMEN PELVIS FINDINGS Hepatobiliary: Slight interval enlargement of a heterogeneous, hypodense mass of the liver dome, measuring 7.1 x 6.8 cm, previously 6.8 x 6.3 cm when measured similarly (series 2, image 64). Increased size and conspicuity of small adjacent satellite lesions, for example inferiorly measuring 1.4 x 1.2 cm, previously no greater than 0.5 cm  (series 2, image 71), and inferiorly and posteriorly in hepatic segment VI, measuring 3.4 x 3.2 cm not clearly visualized on prior examination (series 2, image 68). No gallstones, gallbladder wall thickening, or biliary dilatation. Pancreas: Unremarkable. No pancreatic ductal dilatation or surrounding inflammatory changes. Spleen: Normal in size without significant abnormality. Adrenals/Urinary Tract: Adrenal glands are unremarkable. Multiseptated, rim calcified cyst of the peripheral superior pole of the right kidney, as well as multiple simple, benign bilateral renal cortical cysts. Kidneys are otherwise normal, without renal calculi, solid lesion, or hydronephrosis. Bladder is unremarkable. Stomach/Bowel: Stomach is within normal limits. Appendix appears normal. No evidence of bowel wall thickening, distention, or inflammatory changes. Vascular/Lymphatic: Aortic atherosclerosis. Interval enlargement of gastrohepatic and celiac axis lymph nodes, largest measuring 3.9 x 2.3 cm, previously 2.6 x 1.5 cm (series 2, image 64). Reproductive: Prostatomegaly. Other: No abdominal wall hernia or abnormality. New small volume loculated appearing perihepatic ascites (series 2, image 65). Significant interval increase in diffuse, fine peritoneal thickening and fat stranding (series 2, image 96). Musculoskeletal: No acute osseous findings. Status post left hip total arthroplasty IMPRESSION: 1. Slight interval enlargement of a heterogeneous, hypodense mass of the liver dome as well as additional adjacent satellite lesions. 2. Interval enlargement of gastrohepatic and celiac axis lymph nodes. 3. Significant interval increase in diffuse, fine peritoneal thickening and fat stranding. New small volume loculated appearing perihepatic ascites. 4. Interval enlargement of a sclerotic lesion of the mid sternal body, as well as new lytic lesions of T4 and the inferior endplate of T9. 5. Constellation of findings is consistent with  worsened cholangiocarcinoma, as well as nodal, peritoneal, and osseous metastatic disease. 6. Similar, extensive irregular interstitial and ground-glass airspace opacity throughout the left lung base. Interlobular septal thickening and scarring throughout the right lung base. Findings are most consistent with sequelae of prior infection or inflammation, however lymphangitic metastatic disease is not excluded. 7. Coronary artery disease. 8. Aortic valve calcifications. Correlate for echocardiographic evidence of aortic valve dysfunction. Aortic Atherosclerosis (ICD10-I70.0). Electronically Signed   By: Jearld Lesch M.D.   On: 04/26/2023 11:42

## 2023-05-20 NOTE — Assessment & Plan Note (Addendum)
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 [facility] 2nd opinion was reviewed.  Recommend to proceed with 3rd line treatment with FOLFOX  Labs are reviewed and discussed with patient. Proceed with FOLFOX

## 2023-05-20 NOTE — Assessment & Plan Note (Addendum)
#  Recurrent lower extremity DVT, secondary to antiphospholipid syndrome.-. Need  life time anticoagulation Continue  Lovenox 1 mg/kg twice daily, as of today, recommend 120 mg BID as he has lost weight.  Currently his platelet count is stable, off cytoreductive chemotherapy. Recommend patient to switch to Coumadin with INR goal of 2-3.  Referral to Coumadin clinic.  He has not established care yet.

## 2023-05-20 NOTE — Patient Instructions (Signed)
[facility]  Discharge Instructions: Thank you for choosing [facility] to provide your oncology and hematology care.  If you have a lab appointment with the [facility], please go directly to the [facility] and check in at the registration area.  Wear comfortable clothing and clothing appropriate for easy access to any Portacath or PICC line.   We strive to give you quality time with your provider. You may need to reschedule your appointment if you arrive late (15 or more minutes).  Arriving late affects you and other patients whose appointments are after yours.  Also, if you miss three or more appointments without notifying the office, you may be dismissed from the clinic at the provider's discretion.      For prescription refill requests, have your pharmacy contact our office and allow 72 hours for refills to be completed.    Today you received the following chemotherapy and/or immunotherapy agents OXALIPLATIN, LEUCOVORIN, 5 FU      To help prevent nausea and vomiting after your treatment, we encourage you to take your nausea medication as directed.  BELOW ARE SYMPTOMS THAT SHOULD BE REPORTED IMMEDIATELY: *FEVER GREATER THAN 100.4 F (38 C) OR HIGHER *CHILLS OR SWEATING *NAUSEA AND VOMITING THAT IS NOT CONTROLLED WITH YOUR NAUSEA MEDICATION *UNUSUAL SHORTNESS OF BREATH *UNUSUAL BRUISING OR BLEEDING *URINARY PROBLEMS (pain or burning when urinating, or frequent urination) *BOWEL PROBLEMS (unusual diarrhea, constipation, pain near the anus) TENDERNESS IN MOUTH AND THROAT WITH OR WITHOUT PRESENCE OF ULCERS (sore throat, sores in mouth, or a toothache) UNUSUAL RASH, SWELLING OR PAIN  UNUSUAL VAGINAL DISCHARGE OR ITCHING   Items with * indicate a potential emergency and should be followed up as soon as possible or go to the Emergency Department if any problems should occur.  Please show the CHEMOTHERAPY ALERT CARD or IMMUNOTHERAPY ALERT  CARD at check-in to the Emergency Department and triage nurse.  Should you have questions after your visit or need to cancel or reschedule your appointment, please contact [facility]  [phone] and follow the prompts.  Office hours are 8:00 a.m. to 4:30 p.m. Monday - Friday. Please note that voicemails left after 4:00 p.m. may not be returned until the following business day.  We are closed weekends and major holidays. You have access to a nurse at all times for urgent questions. Please call the main number to the clinic [phone] and follow the prompts.  For any non-urgent questions, you may also contact your provider using MyChart. We now offer e-Visits for anyone 80 and older to request care online for non-urgent symptoms. For details visit [email or web address].   Also download the MyChart app! Go to the app store, search "MyChart", open the app, select [facility], and log in with your MyChart username and password.  Oxaliplatin Injection What is this medication? OXALIPLATIN (ox AL i PLA tin) treats colorectal cancer. It works by slowing down the growth of cancer cells. This medicine may be used for other purposes; ask your health care provider or pharmacist if you have questions. COMMON BRAND NAME(S): Eloxatin What should I tell my care team before I take this medication? They need to know if you have any of these conditions: Heart disease History of irregular heartbeat or rhythm Liver disease Low blood cell levels (white cells, red cells, and platelets) Lung or breathing disease, such as asthma Take medications that treat or prevent blood clots Tingling of the fingers, toes,  or other nerve disorder An unusual or allergic reaction to oxaliplatin, other medications, foods, dyes, or preservatives If you or your partner are pregnant or trying to get pregnant Breast-feeding How should I use this medication? This medication is injected into a  vein. It is given by your care team in a hospital or clinic setting. Talk to your care team about the use of this medication in children. Special care may be needed. Overdosage: If you think you have taken too much of this medicine contact a poison control [facility] or emergency room at once. NOTE: This medicine is only for you. Do not share this medicine with others. What if I miss a dose? Keep appointments for follow-up doses. It is important not to miss a dose. Call your care team if you are unable to keep an appointment. What may interact with this medication? Do not take this medication with any of the following: Cisapride Dronedarone Pimozide Thioridazine This medication may also interact with the following: Aspirin and aspirin-like medications Certain medications that treat or prevent blood clots, such as warfarin, apixaban, dabigatran, and rivaroxaban Cisplatin Cyclosporine Diuretics Medications for infection, such as acyclovir, adefovir, amphotericin B, bacitracin, cidofovir, foscarnet, ganciclovir, gentamicin, pentamidine, vancomycin NSAIDs, medications for pain and inflammation, such as ibuprofen or naproxen Other medications that cause heart rhythm changes Pamidronate Zoledronic acid This list may not describe all possible interactions. Give your health care provider a list of all the medicines, herbs, non-prescription drugs, or dietary supplements you use. Also tell them if you smoke, drink alcohol, or use illegal drugs. Some items may interact with your medicine. What should I watch for while using this medication? Your condition will be monitored carefully while you are receiving this medication. You may need blood work while taking this medication. This medication may make you feel generally unwell. This is not uncommon as chemotherapy can affect healthy cells as well as cancer cells. Report any side effects. Continue your course of treatment even though you feel ill unless  your care team tells you to stop. This medication may increase your risk of getting an infection. Call your care team for advice if you get a fever, chills, sore throat, or other symptoms of a cold or flu. Do not treat yourself. Try to avoid being around people who are sick. Avoid taking medications that contain aspirin, acetaminophen, ibuprofen, naproxen, or ketoprofen unless instructed by your care team. These medications may hide a fever. Be careful brushing or flossing your teeth or using a toothpick because you may get an infection or bleed more easily. If you have any dental work done, tell your dentist you are receiving this medication. This medication can make you more sensitive to cold. Do not drink cold drinks or use ice. Cover exposed skin before coming in contact with cold temperatures or cold objects. When out in cold weather wear warm clothing and cover your mouth and nose to warm the air that goes into your lungs. Tell your care team if you get sensitive to the cold. Talk to your care team if you or your partner are pregnant or think either of you might be pregnant. This medication can cause serious birth defects if taken during pregnancy and for 9 months after the last dose. A negative pregnancy test is required before starting this medication. A reliable form of contraception is recommended while taking this medication and for 9 months after the last dose. Talk to your care team about effective forms of contraception. Do not father  a child while taking this medication and for 6 months after the last dose. Use a condom while having sex during this time period. Do not breastfeed while taking this medication and for 3 months after the last dose. This medication may cause infertility. Talk to your care team if you are concerned about your fertility. What side effects may I notice from receiving this medication? Side effects that you should report to your care team as soon as possible: Allergic  reactions--skin rash, itching, hives, swelling of the face, lips, tongue, or throat Bleeding--bloody or black, tar-like stools, vomiting blood or brown material that looks like coffee grounds, red or dark brown urine, small red or purple spots on skin, unusual bruising or bleeding Dry cough, shortness of breath or trouble breathing Heart rhythm changes--fast or irregular heartbeat, dizziness, feeling faint or lightheaded, chest pain, trouble breathing Infection--fever, chills, cough, sore throat, wounds that don't heal, pain or trouble when passing urine, general feeling of discomfort or being unwell Liver injury--right upper belly pain, loss of appetite, nausea, light-colored stool, dark yellow or brown urine, yellowing skin or eyes, unusual weakness or fatigue Low red blood cell level--unusual weakness or fatigue, dizziness, headache, trouble breathing Muscle injury--unusual weakness or fatigue, muscle pain, dark yellow or brown urine, decrease in amount of urine Pain, tingling, or numbness in the hands or feet Sudden and severe headache, confusion, change in vision, seizures, which may be signs of posterior reversible encephalopathy syndrome (PRES) Unusual bruising or bleeding Side effects that usually do not require medical attention (report to your care team if they continue or are bothersome): Diarrhea Nausea Pain, redness, or swelling with sores inside the mouth or throat Unusual weakness or fatigue Vomiting This list may not describe all possible side effects. Call your doctor for medical advice about side effects. You may report side effects to FDA at 1-800-FDA-1088. Where should I keep my medication? This medication is given in a hospital or clinic. It will not be stored at home. NOTE: This sheet is a summary. It may not cover all possible information. If you have questions about this medicine, talk to your doctor, pharmacist, or health care provider.  2024 Elsevier/Gold Standard  (2022-07-31 00:00:00)  Leucovorin Injection What is this medication? LEUCOVORIN (loo koe VOR in) prevents side effects from certain medications, such as methotrexate. It works by increasing folate levels. This helps protect healthy cells in your body. It may also be used to treat anemia caused by low levels of folate. It can also be used with fluorouracil, a type of chemotherapy, to treat colorectal cancer. It works by increasing the effects of fluorouracil in the body. This medicine may be used for other purposes; ask your health care provider or pharmacist if you have questions. What should I tell my care team before I take this medication? They need to know if you have any of these conditions: Anemia from low levels of vitamin B12 in the blood An unusual or allergic reaction to leucovorin, folic acid, other medications, foods, dyes, or preservatives Pregnant or trying to get pregnant Breastfeeding How should I use this medication? This medication is injected into a vein or a muscle. It is given by your care team in a hospital or clinic setting. Talk to your care team about the use of this medication in children. Special care may be needed. Overdosage: If you think you have taken too much of this medicine contact a poison control [facility] or emergency room at once. NOTE: This medicine is  only for you. Do not share this medicine with others. What if I miss a dose? Keep appointments for follow-up doses. It is important not to miss your dose. Call your care team if you are unable to keep an appointment. What may interact with this medication? Capecitabine Fluorouracil Phenobarbital Phenytoin Primidone Trimethoprim;sulfamethoxazole This list may not describe all possible interactions. Give your health care provider a list of all the medicines, herbs, non-prescription drugs, or dietary supplements you use. Also tell them if you smoke, drink alcohol, or use illegal drugs. Some items may interact  with your medicine. What should I watch for while using this medication? Your condition will be monitored carefully while you are receiving this medication. This medication may increase the side effects of 5-fluorouracil. Tell your care team if you have diarrhea or mouth sores that do not get better or that get worse. What side effects may I notice from receiving this medication? Side effects that you should report to your care team as soon as possible: Allergic reactions--skin rash, itching, hives, swelling of the face, lips, tongue, or throat This list may not describe all possible side effects. Call your doctor for medical advice about side effects. You may report side effects to FDA at 1-800-FDA-1088. Where should I keep my medication? This medication is given in a hospital or clinic. It will not be stored at home. NOTE: This sheet is a summary. It may not cover all possible information. If you have questions about this medicine, talk to your doctor, pharmacist, or health care provider.  2024 Elsevier/Gold Standard (2022-03-20 00:00:00)  Fluorouracil Injection What is this medication? FLUOROURACIL (flure oh YOOR a sil) treats some types of cancer. It works by slowing down the growth of cancer cells. This medicine may be used for other purposes; ask your health care provider or pharmacist if you have questions. COMMON BRAND NAME(S): Adrucil What should I tell my care team before I take this medication? They need to know if you have any of these conditions: Blood disorders Dihydropyrimidine dehydrogenase (DPD) deficiency Infection, such as chickenpox, cold sores, herpes Kidney disease Liver disease Poor nutrition Recent or ongoing radiation therapy An unusual or allergic reaction to fluorouracil, other medications, foods, dyes, or preservatives If you or your partner are pregnant or trying to get pregnant Breast-feeding How should I use this medication? This medication is injected  into a vein. It is administered by your care team in a hospital or clinic setting. Talk to your care team about the use of this medication in children. Special care may be needed. Overdosage: If you think you have taken too much of this medicine contact a poison control [facility] or emergency room at once. NOTE: This medicine is only for you. Do not share this medicine with others. What if I miss a dose? Keep appointments for follow-up doses. It is important not to miss your dose. Call your care team if you are unable to keep an appointment. What may interact with this medication? Do not take this medication with any of the following: Live virus vaccines This medication may also interact with the following: Medications that treat or prevent blood clots, such as warfarin, enoxaparin, dalteparin This list may not describe all possible interactions. Give your health care provider a list of all the medicines, herbs, non-prescription drugs, or dietary supplements you use. Also tell them if you smoke, drink alcohol, or use illegal drugs. Some items may interact with your medicine. What should I watch for while using this  medication? Your condition will be monitored carefully while you are receiving this medication. This medication may make you feel generally unwell. This is not uncommon as chemotherapy can affect healthy cells as well as cancer cells. Report any side effects. Continue your course of treatment even though you feel ill unless your care team tells you to stop. In some cases, you may be given additional medications to help with side effects. Follow all directions for their use. This medication may increase your risk of getting an infection. Call your care team for advice if you get a fever, chills, sore throat, or other symptoms of a cold or flu. Do not treat yourself. Try to avoid being around people who are sick. This medication may increase your risk to bruise or bleed. Call your care team if  you notice any unusual bleeding. Be careful brushing or flossing your teeth or using a toothpick because you may get an infection or bleed more easily. If you have any dental work done, tell your dentist you are receiving this medication. Avoid taking medications that contain aspirin, acetaminophen, ibuprofen, naproxen, or ketoprofen unless instructed by your care team. These medications may hide a fever. Do not treat diarrhea with over the counter products. Contact your care team if you have diarrhea that lasts more than 2 days or if it is severe and watery. This medication can make you more sensitive to the sun. Keep out of the sun. If you cannot avoid being in the sun, wear protective clothing and sunscreen. Do not use sun lamps, tanning beds, or tanning booths. Talk to your care team if you or your partner wish to become pregnant or think you might be pregnant. This medication can cause serious birth defects if taken during pregnancy and for 3 months after the last dose. A reliable form of contraception is recommended while taking this medication and for 3 months after the last dose. Talk to your care team about effective forms of contraception. Do not father a child while taking this medication and for 3 months after the last dose. Use a condom while having sex during this time period. Do not breastfeed while taking this medication. This medication may cause infertility. Talk to your care team if you are concerned about your fertility. What side effects may I notice from receiving this medication? Side effects that you should report to your care team as soon as possible: Allergic reactions--skin rash, itching, hives, swelling of the face, lips, tongue, or throat Heart attack--pain or tightness in the chest, shoulders, arms, or jaw, nausea, shortness of breath, cold or clammy skin, feeling faint or lightheaded Heart failure--shortness of breath, swelling of the ankles, feet, or hands, sudden weight  gain, unusual weakness or fatigue Heart rhythm changes--fast or irregular heartbeat, dizziness, feeling faint or lightheaded, chest pain, trouble breathing High ammonia level--unusual weakness or fatigue, confusion, loss of appetite, nausea, vomiting, seizures Infection--fever, chills, cough, sore throat, wounds that don't heal, pain or trouble when passing urine, general feeling of discomfort or being unwell Low red blood cell level--unusual weakness or fatigue, dizziness, headache, trouble breathing Pain, tingling, or numbness in the hands or feet, muscle weakness, change in vision, confusion or trouble speaking, loss of balance or coordination, trouble walking, seizures Redness, swelling, and blistering of the skin over hands and feet Severe or prolonged diarrhea Unusual bruising or bleeding Side effects that usually do not require medical attention (report to your care team if they continue or are bothersome): Dry skin Headache Increased  tears Nausea Pain, redness, or swelling with sores inside the mouth or throat Sensitivity to light Vomiting This list may not describe all possible side effects. Call your doctor for medical advice about side effects. You may report side effects to FDA at 1-800-FDA-1088. Where should I keep my medication? This medication is given in a hospital or clinic. It will not be stored at home. NOTE: This sheet is a summary. It may not cover all possible information. If you have questions about this medicine, talk to your doctor, pharmacist, or health care provider.  2024 Elsevier/Gold Standard (2022-02-20 00:00:00)

## 2023-05-20 NOTE — Telephone Encounter (Signed)
Called and informed pt that Dr. Cathie Hoops recommends decreasing Lovenox to 120mg  BID. Pt verbalized understanding.

## 2023-05-20 NOTE — Assessment & Plan Note (Signed)
Skin purple discoloration/ rash Differential includes radiation dermatitis, early stage pressure ulcer, cellulitis.  Recommend patient to consider seat cushion, rotation position, monitor the area.

## 2023-05-20 NOTE — Assessment & Plan Note (Signed)
Continue Lomotil QID PRN, stop Lactulose

## 2023-05-20 NOTE — Assessment & Plan Note (Signed)
Continue slow Mag to twice daily

## 2023-05-20 NOTE — Progress Notes (Signed)
Pt here for follow up. Pt reports that he has a large bruise to left buttock and is unsure if it may be due to radiation. Pt also states that he had some bleeding from "sack" on Saturday. Has not happened again. Dermatologist removed 2 places on back.

## 2023-05-20 NOTE — Telephone Encounter (Signed)
Pt was in clinic today for MD visit. Informed him that in order for him to see coumadin clinic at [facility], he would need to re establish care with PCP there. He verbalized understanding and states he will call to set up appt.

## 2023-05-21 ENCOUNTER — Other Ambulatory Visit: Payer: Self-pay | Admitting: Family Medicine

## 2023-05-21 ENCOUNTER — Encounter: Payer: Self-pay | Admitting: Family Medicine

## 2023-05-21 ENCOUNTER — Other Ambulatory Visit: Payer: Self-pay

## 2023-05-21 ENCOUNTER — Other Ambulatory Visit: Payer: Self-pay | Admitting: Oncology

## 2023-05-21 DIAGNOSIS — E119 Type 2 diabetes mellitus without complications: Secondary | ICD-10-CM

## 2023-05-21 LAB — CANCER ANTIGEN 19-9: CA 19-9: 58 U/mL — ABNORMAL HIGH (ref 0–35)

## 2023-05-21 LAB — CEA: CEA: 7 ng/mL — ABNORMAL HIGH (ref 0.0–4.7)

## 2023-05-21 MED ORDER — ENOXAPARIN SODIUM 120 MG/0.8ML IJ SOSY: 120.0000 mg | PREFILLED_SYRINGE | Freq: Two times a day (BID) | INTRAMUSCULAR | 2 refills | Status: DC

## 2023-05-22 ENCOUNTER — Inpatient Hospital Stay: Payer: BC Managed Care – PPO

## 2023-05-22 VITALS — BP 155/65 | HR 66 | Temp 97.2°F | Resp 18

## 2023-05-22 DIAGNOSIS — Z452 Encounter for adjustment and management of vascular access device: Secondary | ICD-10-CM | POA: Diagnosis not present

## 2023-05-22 DIAGNOSIS — I4891 Unspecified atrial fibrillation: Secondary | ICD-10-CM | POA: Diagnosis not present

## 2023-05-22 DIAGNOSIS — R197 Diarrhea, unspecified: Secondary | ICD-10-CM | POA: Diagnosis not present

## 2023-05-22 DIAGNOSIS — I429 Cardiomyopathy, unspecified: Secondary | ICD-10-CM | POA: Diagnosis not present

## 2023-05-22 DIAGNOSIS — C7951 Secondary malignant neoplasm of bone: Secondary | ICD-10-CM | POA: Diagnosis not present

## 2023-05-22 DIAGNOSIS — C221 Intrahepatic bile duct carcinoma: Secondary | ICD-10-CM | POA: Diagnosis not present

## 2023-05-22 DIAGNOSIS — Z86718 Personal history of other venous thrombosis and embolism: Secondary | ICD-10-CM | POA: Diagnosis not present

## 2023-05-22 DIAGNOSIS — R21 Rash and other nonspecific skin eruption: Secondary | ICD-10-CM | POA: Diagnosis not present

## 2023-05-22 DIAGNOSIS — Z5111 Encounter for antineoplastic chemotherapy: Secondary | ICD-10-CM | POA: Diagnosis not present

## 2023-05-22 DIAGNOSIS — Z510 Encounter for antineoplastic radiation therapy: Secondary | ICD-10-CM | POA: Diagnosis not present

## 2023-05-22 DIAGNOSIS — D6861 Antiphospholipid syndrome: Secondary | ICD-10-CM | POA: Diagnosis not present

## 2023-05-22 MED ORDER — HEPARIN SOD (PORK) LOCK FLUSH 100 UNIT/ML IV SOLN
500.0000 [IU] | Freq: Once | INTRAVENOUS | Status: AC | PRN
  Administered 2023-05-22: 500 [IU]
  Filled 2023-05-22: qty 5

## 2023-05-22 MED ORDER — SODIUM CHLORIDE 0.9% FLUSH
10.0000 mL | INTRAVENOUS | Status: DC | PRN
  Administered 2023-05-22: 10 mL
  Filled 2023-05-22: qty 10

## 2023-05-23 ENCOUNTER — Telehealth: Payer: Self-pay

## 2023-05-23 NOTE — Telephone Encounter (Signed)
Message sent to Dr Fry for advise 

## 2023-05-24 NOTE — Telephone Encounter (Signed)
Yes I agree with him getting back on Warfarin. Set up an in person OV with me since I have not seen him since October

## 2023-05-27 ENCOUNTER — Inpatient Hospital Stay: Payer: BC Managed Care – PPO

## 2023-05-27 ENCOUNTER — Encounter: Payer: Self-pay | Admitting: Oncology

## 2023-05-27 ENCOUNTER — Inpatient Hospital Stay (HOSPITAL_BASED_OUTPATIENT_CLINIC_OR_DEPARTMENT_OTHER): Payer: BC Managed Care – PPO | Admitting: Oncology

## 2023-05-27 ENCOUNTER — Other Ambulatory Visit: Payer: Self-pay

## 2023-05-27 VITALS — BP 122/60 | HR 62 | Temp 96.8°F | Resp 18 | Wt 258.7 lb

## 2023-05-27 DIAGNOSIS — Z86718 Personal history of other venous thrombosis and embolism: Secondary | ICD-10-CM | POA: Diagnosis not present

## 2023-05-27 DIAGNOSIS — I429 Cardiomyopathy, unspecified: Secondary | ICD-10-CM | POA: Diagnosis not present

## 2023-05-27 DIAGNOSIS — Z5111 Encounter for antineoplastic chemotherapy: Secondary | ICD-10-CM | POA: Diagnosis not present

## 2023-05-27 DIAGNOSIS — R197 Diarrhea, unspecified: Secondary | ICD-10-CM | POA: Diagnosis not present

## 2023-05-27 DIAGNOSIS — I4891 Unspecified atrial fibrillation: Secondary | ICD-10-CM | POA: Diagnosis not present

## 2023-05-27 DIAGNOSIS — C221 Intrahepatic bile duct carcinoma: Secondary | ICD-10-CM

## 2023-05-27 DIAGNOSIS — I4819 Other persistent atrial fibrillation: Secondary | ICD-10-CM

## 2023-05-27 DIAGNOSIS — D6861 Antiphospholipid syndrome: Secondary | ICD-10-CM | POA: Diagnosis not present

## 2023-05-27 DIAGNOSIS — Z95828 Presence of other vascular implants and grafts: Secondary | ICD-10-CM

## 2023-05-27 DIAGNOSIS — L589 Radiodermatitis, unspecified: Secondary | ICD-10-CM

## 2023-05-27 DIAGNOSIS — R21 Rash and other nonspecific skin eruption: Secondary | ICD-10-CM

## 2023-05-27 DIAGNOSIS — C7951 Secondary malignant neoplasm of bone: Secondary | ICD-10-CM

## 2023-05-27 DIAGNOSIS — Z510 Encounter for antineoplastic radiation therapy: Secondary | ICD-10-CM | POA: Diagnosis not present

## 2023-05-27 DIAGNOSIS — R634 Abnormal weight loss: Secondary | ICD-10-CM

## 2023-05-27 DIAGNOSIS — Z452 Encounter for adjustment and management of vascular access device: Secondary | ICD-10-CM | POA: Diagnosis not present

## 2023-05-27 LAB — CBC (CANCER CENTER ONLY)
HCT: 33.7 % — ABNORMAL LOW (ref 39.0–52.0)
Hemoglobin: 10.7 g/dL — ABNORMAL LOW (ref 13.0–17.0)
MCH: 28.2 pg (ref 26.0–34.0)
MCHC: 31.8 g/dL (ref 30.0–36.0)
MCV: 88.9 fL (ref 80.0–100.0)
Platelet Count: 96 10*3/uL — ABNORMAL LOW (ref 150–400)
RBC: 3.79 MIL/uL — ABNORMAL LOW (ref 4.22–5.81)
RDW: 14.6 % (ref 11.5–15.5)
WBC Count: 3.1 10*3/uL — ABNORMAL LOW (ref 4.0–10.5)
nRBC: 0 % (ref 0.0–0.2)

## 2023-05-27 LAB — CMP (CANCER CENTER ONLY)
ALT: 16 U/L (ref 0–44)
AST: 25 U/L (ref 15–41)
Albumin: 3 g/dL — ABNORMAL LOW (ref 3.5–5.0)
Alkaline Phosphatase: 77 U/L (ref 38–126)
Anion gap: 8 (ref 5–15)
BUN: 8 mg/dL (ref 8–23)
CO2: 26 mmol/L (ref 22–32)
Calcium: 8.5 mg/dL — ABNORMAL LOW (ref 8.9–10.3)
Chloride: 106 mmol/L (ref 98–111)
Creatinine: 0.67 mg/dL (ref 0.61–1.24)
GFR, Estimated: >60 mL/min (ref >60)
Glucose, Bld: 115 mg/dL — ABNORMAL HIGH (ref 70–99)
Potassium: 3.7 mmol/L (ref 3.5–5.1)
Sodium: 140 mmol/L (ref 135–145)
Total Bilirubin: 0.5 mg/dL (ref 0.3–1.2)
Total Protein: 6.1 g/dL — ABNORMAL LOW (ref 6.5–8.1)

## 2023-05-27 LAB — CBC WITH DIFFERENTIAL (CANCER CENTER ONLY)
Abs Immature Granulocytes: 0.02 10*3/uL (ref 0.00–0.07)
Basophils Absolute: 0 10*3/uL (ref 0.0–0.1)
Basophils Relative: 1 %
Eosinophils Absolute: 0.1 10*3/uL (ref 0.0–0.5)
Eosinophils Relative: 3 %
HCT: 33.4 % — ABNORMAL LOW (ref 39.0–52.0)
Hemoglobin: 10.5 g/dL — ABNORMAL LOW (ref 13.0–17.0)
Immature Granulocytes: 1 %
Lymphocytes Relative: 6 %
Lymphs Abs: 0.2 10*3/uL — ABNORMAL LOW (ref 0.7–4.0)
MCH: 28.2 pg (ref 26.0–34.0)
MCHC: 31.4 g/dL (ref 30.0–36.0)
MCV: 89.5 fL (ref 80.0–100.0)
Monocytes Absolute: 0.2 10*3/uL (ref 0.1–1.0)
Monocytes Relative: 7 %
Neutro Abs: 2.5 10*3/uL (ref 1.7–7.7)
Neutrophils Relative %: 82 %
Platelet Count: 96 10*3/uL — ABNORMAL LOW (ref 150–400)
RBC: 3.73 MIL/uL — ABNORMAL LOW (ref 4.22–5.81)
RDW: 14.6 % (ref 11.5–15.5)
WBC Count: 3 10*3/uL — ABNORMAL LOW (ref 4.0–10.5)
nRBC: 0 % (ref 0.0–0.2)

## 2023-05-27 LAB — PHOSPHORUS: Phosphorus: 2.5 mg/dL (ref 2.5–4.6)

## 2023-05-27 MED ORDER — SODIUM CHLORIDE 0.9% FLUSH
10.0000 mL | Freq: Once | INTRAVENOUS | Status: AC
  Administered 2023-05-27: 10 mL via INTRAVENOUS
  Filled 2023-05-27: qty 10

## 2023-05-27 MED ORDER — AMOXICILLIN-POT CLAVULANATE 875-125 MG PO TABS: 1.0000 | ORAL_TABLET | Freq: Two times a day (BID) | ORAL | 0 refills | Status: DC

## 2023-05-27 MED ORDER — SILVER SULFADIAZINE 1 % EX CREA: 1.0000 | TOPICAL_CREAM | Freq: Every day | CUTANEOUS | 0 refills | Status: DC

## 2023-05-27 MED ORDER — FENTANYL 25 MCG/HR TD PT72: 1.0000 | MEDICATED_PATCH | TRANSDERMAL | 0 refills | Status: DC

## 2023-05-27 MED ORDER — HEPARIN SOD (PORK) LOCK FLUSH 100 UNIT/ML IV SOLN
500.0000 [IU] | Freq: Once | INTRAVENOUS | Status: AC
  Administered 2023-05-27: 500 [IU] via INTRAVENOUS
  Filled 2023-05-27: qty 5

## 2023-05-27 NOTE — Assessment & Plan Note (Signed)
#  Recurrent lower extremity DVT, secondary to antiphospholipid syndrome.-. Need  life time anticoagulation Continue  Lovenox 1 mg/kg twice daily, as of today, recommend 120 mg BID as he has lost weight.  Currently his platelet count is stable, off cytoreductive chemotherapy. Recommend patient to switch to Coumadin with INR goal of 2-3.  Referral to Coumadin clinic.  He has not established care yet.

## 2023-05-27 NOTE — Assessment & Plan Note (Signed)
radiation dermatitis, cellulitis  I will cover with a course of antibiotics.  Recommend topical Silver Sulfadiazine, non adhesive wound dressing.  Recommend patient to consider seat cushion, rotation position, monitor the area.

## 2023-05-27 NOTE — Assessment & Plan Note (Signed)
S/p palliative  radiation  Consider bisphosphonate after dental clearance is obtained. S/p left ischium.  radiation due to concern of pathological fracture.  Right thigh pain, will ask if Radon[facility] could provide palliative RT

## 2023-05-27 NOTE — Assessment & Plan Note (Signed)
Follow with nutritionist.

## 2023-05-27 NOTE — Assessment & Plan Note (Signed)
And cardiomyopathy, follow up with cardiology Rate controlled. S/p ablation on 03/26/23  

## 2023-05-27 NOTE — Assessment & Plan Note (Addendum)
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 [facility] 2nd opinion was reviewed.  Recommend to proceed with 3rd line treatment with FOLFOX  Labs are reviewed and discussed with patient. S/p 1 cycle of  FOLFOX, tolerated well.

## 2023-05-27 NOTE — Progress Notes (Signed)
Patient would like to discuss pressure sore that was looked at during last visit. It has now gotten bigger and irritated.  He also is complaining about his right thigh, it is painful.

## 2023-05-27 NOTE — Progress Notes (Signed)
Hematology/Oncology Progress note [phone] [phone]    CHIEF COMPLAINTS/REASON FOR VISIT:  Follow up for right lower extremity DVT, antiphospholipid syndrome, metastatic cholangiocarcinoma.  ASSESSMENT & PLAN:   Cancer Staging  Cholangiocarcinoma [facility]) Staging form: Intrahepatic Bile Duct, AJCC 8th Edition - Clinical stage from 08/27/2022: Stage IV (cT2, cN1, cM1) - Signed by [name], MD on 09/13/2022   Cholangiocarcinoma (HCC) Cholangiocarcinoma, NGS-Tempus liquid biopsy FGFR2 -ADK mutation, TMB 0,  S/p Gemcitabine, Cisplatin and Durvalumab x 6 , 01/2023 CT progression/enlarged liver lesions --03/03/23 2nd line Futibatinib- 04/26/23 CT showed interval progression  Recommend patient to stop Futibatinib. [facility] 2nd opinion was reviewed.  Recommend to proceed with 3rd line treatment with FOLFOX  Labs are reviewed and discussed with patient. S/p 1 cycle of  FOLFOX, tolerated well.   Antiphospholipid syndrome (HCC) #Recurrent lower extremity DVT, secondary to antiphospholipid syndrome.-. Need  life time anticoagulation Continue  Lovenox 1 mg/kg twice daily, as of today, recommend 120 mg BID as he has lost weight.  Currently his platelet count is stable, off cytoreductive chemotherapy. Recommend patient to switch to Coumadin with INR goal of 2-3.   Referral to Coumadin clinic.  He has not established care yet.   Hypocalcemia Continue calcium supplementation, currently on 1100 mg twice daily.   Recommend Vitamin D 1000 units daily.   Metastasis to bone [facility]) S/p palliative  radiation  Consider bisphosphonate after dental clearance is obtained. S/p left ischium.  radiation due to concern of pathological fracture.  Right thigh pain, will ask if Radonc could provide palliative RT   Atrial fibrillation (HCC) And cardiomyopathy, follow up with cardiology Rate controlled. S/p ablation on 03/26/23   Weight loss Follow with nutritionist.   Radiation  dermatitis radiation dermatitis, cellulitis  I will cover with a course of antibiotics.  Recommend topical Silver Sulfadiazine, non adhesive wound dressing.  Recommend patient to consider seat cushion, rotation position, monitor the area.      Orders Placed This Encounter  Procedures   CBC with Differential (Cancer Center Only)    Standing Status:   Future    Standing Expiration Date:   05/26/2024   Follow up 1 week   All questions were answered. The patient knows to call the clinic with any problems, questions or concerns.  [name], MD, PhD [facility] Hematology Oncology 05/27/2023    HISTORY OF PRESENTING ILLNESS:  Patient reports remote history of left lower extremity DVT in 2002.  He was initiated on Lovenox and bridged to Coumadin.  Patient took warfarin for 2 years before anticoagulation was stopped. 12/03/2021, patient presented to emergency room for evaluation of right lower extremity pain and swelling for about a week.  Started on the inner side of right thigh and migrated to the right calf. + Associated with swelling.  Denies any recent injury, hospitalization, surgery.  He first noticed the symptoms after playing basketball with his grandson.   12/03/2021, right lower extremity ultrasound showed occlusive DVT extending from the mid aspect of the femoral vein through the imaged tibial vein.  Age-indeterminate. Patient was started on Xarelto. He was referred to establish care with vascular surgeon and was seen by Sheppard Plumber on 12/06/2021.  Shared decision was made not to proceed with embolectomy.  Continue anticoagulation. Patient was referred to hematology oncology for further evaluation.  Patient denies any family history of blood clots.  Denies any unintentional weight loss, fever or night sweats. 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  He sometimes stay in his car while waiting  for next assessment appointment.  He reports the right lower extremity symptom has improved since the start of Xarelto.  No active bleeding events.  #12/18/2021 hypercoagulable work-up showed JAK2 V617F mutation negative, with reflex to other mutations CALR, MPL, JAK 2 Ex 12-15 mutations negative, negative anticardiolipin IgG and IgM antibodies, positive lupus anticoagulant, negative factor V Leiden mutation, negative prothrombin gene mutation, normal protein C activity, normal protein S antigen level. Patient was recommended to switch to Coumadin with INR goal of 2-3.  #03/15/2022, repeat lupus anticoagulant is persistently positive- + antiphospholipid syndrome.  Patient is currently on Lovenox 1 mg/kg twice daily.  admitted due to multifocal pneumonia, treated with Influenza panel negative, COVID-negative, HIV negative, Complete course of antibiotics with Zithromax, Vantin   + HCV antibody positive, HCV RNA quantification not detected. HCV RT PCR not detected.   INTERVAL HISTORY Christopher Marsh is a 62 y.o. male who has above history reviewed by me today presents for follow up visit for management of right lower extremity DVT and antiphospholipid syndrome, metastatic cholangiocarcinoma.  Oncology History  Cholangiocarcinoma (HCC)  10/30/2021 Imaging   CT chest wo contrast 1. Heterogeneous bilateral ground-glass disease, multilobar but worst in the right middle lobe, lingula and lower lobes. There is progression of the abnormality since 10/12/2022. Though pneumonia could produce this appearance, given persistence and fluctuating appearance since October, consider alternative entities such as hypersensitivity pneumonitis or idiopathic interstitial pneumonia. 2. Slightly enlarged pulmonary trunk, possible arterial hypertension 3. Redemonstrated large hypodense right hepatic lobe mass.   08/18/2022 Imaging   CT chest w contrast showed 1. Multifocal airspace disease in both lungs, left side  greater than right. Findings are most compatible with multifocal pneumonia. 2. Multiple new hepatic lesions with enlarging lymph nodes in the upper abdomen and chest. Findings are concerning for metastatic disease. Based on the multifocal pneumonia, hepatic abscesses would also be in the differential diagnosis. Recommend further characterization of the abdomen and pelvis with CT with IV contrast. 3. Enlargement of the main pulmonary artery could be associated with pulmonary hypertension. 4. Coronary artery calcifications. 5. Multinodular goiter. Patient has known thyroid nodules and previous thyroid ultrasound.    08/18/2022 Imaging   CT abdomen pelvis w contrast  Interval development of 6.5 x 5.2 cm heterogeneously enhancing mass in right hepatic lobe. This is highly concerning for neoplasm or malignancy, and further evaluation with MRI is recommended.   Mildly enlarged periaortic adenopathy is noted, with the largest lymph node measuring 12 mm. Metastatic disease cannot be excluded. Mildly enlarged adenopathy is also noted in the gastrohepatic ligament, but this is unchanged compared to prior exam.   Stable bibasilar lung opacities are noted concerning for inflammation, atelectasis or possibly scarring.   Grossly stable multi-septated cystic lesion is seen involving upper pole of right kidney with peripheral calcifications compared to prior exam of 2019, most likely representing benign etiology.   Aortic Atherosclerosis    08/18/2022 - 08/21/2022 Hospital Admission   Admitted due to multifocal pneumonia, treated with Influenza panel negative, COVID-negative, HIV negative, Complete course of antibiotics with Zithromax, Vantin     08/19/2022 Imaging   MR abdomen w wo contrast  Marked caudate lobe hypertrophy, highly suspicious for hepatic cirrhosis.   Numerous hypervascular masses throughout the right hepatic lobe, highly suspicious for multifocal hepatocellular carcinoma. Recommend  correlation with AFP and consider tissue sampling.   Mild abdominal lymphadenopathy, with  differential diagnosis including metastatic disease and reactive lymphadenopathy in setting of cirrhosis.   Bilateral lower lobe infiltrates, as better demonstrated on recent CT.    08/19/2022 Tumor Marker   AFP 4.1   08/27/2022 Initial Diagnosis   Cholangiocarcinoma  -08/20/22 Liver mass biopsy showed poorly differentiated carcinoma Immunohistochemical stains show that the poorly differentiated carcinoma is positive for CK7 and MOC-31, suggestive of a poorly differentiated adenocarcinoma.  Immunohistochemical stains for CK20, CDX2, HepPar 1 and arginase are negative.  Immunostain for glypican-3 shows very focal  likely nonspecific labeling.  This immunoprofile is nonspecific but can be compatible with a poorly differentiated primary cholangiocarcinoma in absence of any other lesions.   NGS showed BAP 1 mutation.  Case was presented at tumor board, I have also discussed with pathologist Dr.Rubinas,  IHC pattern is more consistent with adenocarcinoma, unlikely HCC [due to negative HepPar 1 and arginase], cholangiocarcinoma is favored, although this is a diagnosis of exclusion. Second opinion ar [facility]   08/27/2022 Cancer Staging   Staging form: Intrahepatic Bile Duct, AJCC 8th Edition - Clinical stage from 08/27/2022: Stage IV (cT2, cN1, cM1) - Signed by [name], MD on 09/13/2022 Stage prefix: Initial diagnosis   08/27/2022 Tumor Marker   CA 19.9  16 CEA 2.8   09/12/2022 Imaging   PET scan showed 1. Large right hepatic lobe mass is hypermetabolic and consistent with known cholangiocarcinoma. 2. Scattered borderline enlarged upper abdominal lymph nodes do not show any significant FDG uptake. 3. 4 hypermetabolic bone lesions consistent with metastatic disease. 4. Progressive diffuse airspace process in the left lung could be due to atypical pneumonia, pulmonary hemorrhage or left-sided aspiration.  5. Aortic atherosclerosis.  09/12/2022 I had a phone discussion with patient after his PET scan resulted.  Recommend systemic chemotherapy.  Patient would like to defer until his second opinion visit at [facility].   09/15/2022 Imaging   CT angio chest pulmonary embolism protocol showed 1. No evidence for pulmonary embolism. 2. Progression of bilateral multifocal patchy ground-glass and interstitial opacities, left greater than right. Findings are concerning for multifocal pneumonia. 3. Air-fluid levels in the left lower lobe bronchi worrisome for aspiration. 4. Mediastinal and hilar lymphadenopathy has increased in size and number when compared to the prior study, likely reactive.5. Cardiomegaly. 6. Findings compatible with pulmonary artery hypertension.7. Stable right liver mass and complex right renal cystic lesion. 8. Stable enlargement of the left thyroid.   09/15/2022 - 09/17/2022 Hospital Admission   Patient was admitted due to shortness of breath, CT showed progression of bilateral multifocal patchy groundglass and interstitial opacities.  Left greater than right.  Concerning for multifocal pneumonia.  Patient was started on broad-spectrum antibiotics with significant improvement.  He was discharged on oral antibiotics.   09/19/2022 Miscellaneous   Patient went to [facility] for [facility] opinion.  He was seen by Shirlee Latch, who felt that the presentation is consistent with metastatic cholangiocarcinoma, and agrees with the plan of cisplatin/gemcitabine/durvalumab..    10/11/2022 - 01/31/2023 Chemotherapy   Patient is on Treatment Plan : Cisplatin D1,8 + Gemcitabine + Durvalumab  D1,8 q21d x     10/12/2022 Imaging   CT chest w contrast 1. Bilateral airspace and ground-glass opacities have mildly decreased in amount and density when compared to the prior study. Findings are compatible with resolving multifocal pneumonia. Continued follow-up imaging recommended. 2. Stable enlarged heterogeneous  left thyroid gland with 3.2 cm nodule. This can be further evaluated with dedicated ultrasound. 3. Stable hepatic and right renal lesions, incompletely  evaluated.   Aortic Atherosclerosis (ICD10-I70.0).   12/11/2022 Imaging   PET showed  1. There is been mild increase in size of the tracer avid mass within right lobe of liver compatible with known cholangiocarcinoma. The degree of tracer uptake however is slightly decreased in the interval. 2. Multifocal tracer avid bone metastases are again noted. When compared with the previous exam there has been an mixed interval response to therapy. Specifically, there has been interval increase in size and FDG uptake associated with the right ischial tuberosity lesion and there is a new lesion within the posterior left iliac bone. Decreased tracer uptake is associated with the left scapular lesion and there is stable tracer uptake associated with the right T1 transverse process lesion. 3. Persistent ground-glass and airspace densities within the left upper lobe and left lower lobe compatible with inflammatory or infectious process. Interval resolution of previous right lung ground-glass and airspace densities.    12/24/2022 Procedure   Medi port placement by Dr. Wyn Quaker   01/22/2023 Imaging   CT chest abdomen pelvis with contrast 1. Hypodense lesion of the right lobe of the liver, hepatic segment VII/VIII is slightly diminished in size, consistent with treatment response. 2. No evident change in additional smaller hypodense lesions scattered throughout the right lobe of the liver, these more difficult to see on prior noncontrast PET-CT. 3. Unchanged enlarged celiac axis, gastrohepatic ligament, and retroperitoneal lymph nodes. Unchanged prominent high right paratracheal lymph nodes. 4. Unchanged osseous metastases. 5. Slightly improved, although still extensive ground-glass airspace opacity primarily seen throughout the left lower lobe and lingula,nonspecific and  infectious or inflammatory. 6. Coronary artery disease. 7. Emphysema.   02/11/2023 Imaging   Patient self decreased Lovenox dosage to 120mg  once daily.  Increased lower extremity swelling.   02/11/23 [place] bilateral lower extremity showed 1. Nonocclusive DVT within the popliteal veins bilaterally. 2. Occlusive DVT within the posterior tibial veins of the right calf.       02/13/2023 Imaging   CT chest Angiogram  1. Technically adequate exam showing no acute pulmonary embolus. 2. Cardiomegaly. Coronary artery calcifications. 3. Bilateral pleural effusions. 4. Improved aeration of the RIGHT UPPER and LOWER lobes compared to prior study. 5. LEFT LOWER lobe and lingula airspace filling and atelectasis, slightly improved compared to prior study. 6. Cirrhotic morphology of the liver. 7.7 centimeters mass in the RIGHT hepatic lobe. 7. Gastrohepatic ligament adenopathy. 8. A 7 millimeters sclerotic lesion in the UPPER sternum is suspicious for metastatic disease. 9. LEFT thyroid goiter.   02/13/2023 - 02/15/2023 Hospital Admission   A flutter with variable A-V block. Heart rate up to 140.  BNP 119.8 04/18: Echo no overt CHF - EF 50-55  Cardiology recs beta blocker to metoprolol succinate 50 mg bid, Torsemid 40mg  daily Adjusting Lovenox 14mg  BID.    03/03/2023 -  Chemotherapy   Futibatinib 20mg   03/12/23 Decreased to 16mg  daily   04/24/2023 - 04/30/2023 Radiation Therapy   left ischium radiation.    05/20/2023 -  Chemotherapy   Patient is on Treatment Plan : COLORECTAL FOLFOX q14d      INTERVAL HISTORY Christopher Marsh is a 62 y.o. male who has above history reviewed by me today presents for follow up visit for  Cholangiocarcinoma, antiphospholipid syndrome, thrombosis + Fatigue, diarrhea is better controlled. Appetite is good. Lost weight appetite is not good.  He takes Lovenox 120mg  BID currently.  Left butt blister "bursted", some tenderness.  Right upper thigh pain.    Review of  Systems  Constitutional:  Positive for fatigue. Negative for appetite change, chills and fever.  HENT:   Negative for hearing loss and voice change.   Eyes:  Positive for eye problems.  Respiratory:  Negative for chest tightness, cough and shortness of breath.   Cardiovascular:  Positive for leg swelling. Negative for chest pain.  Gastrointestinal:  Positive for diarrhea. Negative for abdominal distention, abdominal pain and blood in stool.  Endocrine: Negative for hot flashes.  Genitourinary:  Negative for difficulty urinating and frequency.   Musculoskeletal:  Negative for arthralgias.  Skin:  Negative for itching.       Left butt skin blister  Neurological:  Negative for extremity weakness.  Hematological:  Negative for adenopathy. Bruises/bleeds easily.  Psychiatric/Behavioral:  Negative for confusion.     MEDICAL HISTORY:  Past Medical History:  Diagnosis Date   Antiphospholipid antibody syndrome (HCC)    Arthritis    Cancer (HCC)    Diabetes mellitus without complication (HCC)    DVT (deep venous thrombosis) (HCC)    Lt leg   Dyspnea    Elevated lipids    Erythrocytosis    Hyperlipidemia    Hypertension    Lower extremity edema    Multinodular thyroid    Post-thrombotic syndrome     SURGICAL HISTORY: Past Surgical History:  Procedure Laterality Date   CARDIOVERSION N/A 03/22/2023   Procedure: CARDIOVERSION;  Surgeon: Antonieta Iba, MD;  Location: [facility] ORS;  Service: Cardiovascular;  Laterality: N/A;   COLONOSCOPY WITH PROPOFOL N/A 09/23/2018   Per Dr. Norma Fredrickson, polyp, repeat in 3 yrs    EYE MUSCLE SURGERY     JOINT REPLACEMENT Left 07/22/2017   PORTA CATH INSERTION N/A 12/24/2022   Procedure: PORTA CATH INSERTION;  Surgeon: Annice Needy, MD;  Location: [facility] INVASIVE CV LAB;  Service: Cardiovascular;  Laterality: N/A;   TOTAL HIP ARTHROPLASTY Left 07/22/2017   Procedure: TOTAL HIP ARTHROPLASTY;  Surgeon: Donato Heinz, MD;  Location: [facility] ORS;  Service:  Orthopedics;  Laterality: Left;    SOCIAL HISTORY: Social History   Socioeconomic History   Marital status: Married    Spouse name: Not on file   Number of children: Not on file   Years of education: Not on file   Highest education level: Not on file  Occupational History   Not on file  Tobacco Use   Smoking status: Former    Current packs/day: 0.00    Average packs/day: 0.5 packs/day for 6.0 years (3.0 ttl pk-yrs)    Types: Cigarettes    Start date: 34    Quit date: 4    Years since quitting: 42.6   Smokeless tobacco: Never   Tobacco comments:    Pt states when he did smoke he smoked at most 1 ppd. ALS 09/26/2022  Vaping Use   Vaping status: Never Used  Substance and Sexual Activity   Alcohol use: Not Currently    Comment: occ   Drug use: No   Sexual activity: Not on file  Other Topics Concern   Not on file  Social History Narrative   Not on file   Social Determinants of Health   Financial Resource Strain: Low Risk  (08/27/2022)   Overall Financial Resource Strain (CARDIA)    Difficulty of Paying Living Expenses: Not very hard  Food Insecurity: No Food Insecurity (02/18/2023)   Hunger Vital Sign    Worried About Running Out of Food in the Last Year: Never true    Ran Out of Food  in the Last Year: Never true  Transportation Needs: No Transportation Needs (02/18/2023)   PRAPARE - Administrator, Civil Service (Medical): No    Lack of Transportation (Non-Medical): No  Physical Activity: Insufficiently Active (08/27/2022)   Exercise Vital Sign    Days of Exercise per Week: 3 days    Minutes of Exercise per Session: 30 min  Stress: Stress Concern Present (08/27/2022)   [employer] of Occupational Health - Occupational Stress Questionnaire    Feeling of Stress : Rather much  Social Connections: Socially Integrated (08/27/2022)   Social Connection and Isolation Panel [NHANES]    Frequency of Communication with Friends and Family: More than  three times a week    Frequency of Social Gatherings with Friends and Family: More than three times a week    Attends Religious Services: 1 to 4 times per year    Active Member of [employer] or Organizations: Yes    Attends Banker Meetings: Never    Marital Status: Married  Catering manager Violence: Not At Risk (02/14/2023)   Humiliation, Afraid, Rape, and Kick questionnaire    Fear of Current or Ex-Partner: No    Emotionally Abused: No    Physically Abused: No    Sexually Abused: No    FAMILY HISTORY: Family History  Problem Relation Age of Onset   Breast cancer Mother    Emphysema Father    Colon cancer Maternal Grandmother     ALLERGIES:  has No Known Allergies.  MEDICATIONS:  Current Outpatient Medications  Medication Sig Dispense Refill   albuterol (VENTOLIN HFA) 108 (90 Base) MCG/ACT inhaler TAKE 2 PUFFS BY MOUTH EVERY 4 HOURS AS NEEDED 8.5 each 1   amoxicillin-clavulanate (AUGMENTIN) 875-125 MG tablet Take 1 tablet by mouth 2 (two) times daily. 10 tablet 0   Calcium Carbonate 500 MG CHEW Chew 2 tablets (1,000 mg total) by mouth daily. 60 tablet 0   diphenoxylate-atropine (LOMOTIL) 2.5-0.025 MG tablet Take 1 tablet by mouth 4 (four) times daily as needed for diarrhea or loose stools. 120 tablet 3   enoxaparin (LOVENOX) 120 MG/0.8ML injection Inject 0.8 mLs (120 mg total) into the skin every 12 (twelve) hours. 48 mL 2   fentaNYL (DURAGESIC) 25 MCG/HR Place 1 patch onto the skin every 3 (three) days. 5 patch 0   glipiZIDE (GLUCOTROL) 10 MG tablet Take 1 tablet (10 mg total) by mouth 2 (two) times daily before a meal. 180 tablet 3   glucose blood test strip 1 each by Other route as needed for other. accu chek aviva plusUse as instructed     ketoconazole (NIZORAL) 2 % cream Apply 1 Application topically 2 (two) times daily as needed for irritation. 60 g 5   Lactulose 20 GM/30ML SOLN Take 30 mLs (20 g total) by mouth daily as needed (constipation.). 450 mL 0    lidocaine-prilocaine (EMLA) cream Apply 1 Application topically as needed. 30 g 5   LORazepam (ATIVAN) 0.5 MG tablet TAKE 1 TABLET (0.5 MG TOTAL) BY MOUTH EVERY 8 (EIGHT) HOURS AS NEEDED FOR ANXIETY (NAUSEA VOMITING). 60 tablet 0   magnesium chloride (SLOW-MAG) 64 MG TBEC SR tablet Take 1 tablet (64 mg total) by mouth 2 (two) times daily. 60 tablet 0   metFORMIN (GLUCOPHAGE) 500 MG tablet TAKE TWO TABLETS BY MOUTH EVERY MORNING WITH BREAKFAST 180 tablet 1   metoprolol tartrate (LOPRESSOR) 50 MG tablet Take 1 tablet (50 mg total) by mouth 2 (two) times daily. 180 tablet 3  MUCINEX D MAX STRENGTH [id number] MG TB12 Take 1,200 mg by mouth 2 (two) times daily as needed (for congestion or to loosen mucous in the chest).     potassium chloride (KLOR-CON) 10 MEQ tablet Take 1 tablet (10 mEq total) by mouth daily. 90 tablet 3   pravastatin (PRAVACHOL) 20 MG tablet Take 1 tablet (20 mg total) by mouth daily. 90 tablet 3   tadalafil (CIALIS) 20 MG tablet Take 0.5-1 tablets (10-20 mg total) by mouth every other day as needed for erectile dysfunction. 20 tablet 11   traZODone (DESYREL) 50 MG tablet Take 1 tablet (50 mg total) by mouth at bedtime as needed for sleep. 30 tablet 1   triamcinolone cream (KENALOG) 0.1 % Apply 1 Application topically 3 (three) times daily. 80 g 5   ondansetron (ZOFRAN) 4 MG tablet Take by mouth.     oxyCODONE (OXY IR/ROXICODONE) 5 MG immediate release tablet Take 1 tablet (5 mg total) by mouth every 4 (four) hours as needed for severe pain. (Patient not taking: Reported on 05/20/2023) 60 tablet 0   torsemide (DEMADEX) 20 MG tablet Take 1 tablet (20 mg total) by mouth 2 (two) times daily as needed. (Patient not taking: Reported on 05/27/2023) 180 tablet 3   No current facility-administered medications for this visit.     PHYSICAL EXAMINATION: ECOG PERFORMANCE STATUS: 1 - Symptomatic but completely ambulatory Vitals:   05/27/23 0932  BP: 122/60  Pulse: 62  Resp: 18  Temp: (!)  96.8 F (36 C)  SpO2: 99%   Filed Weights   05/27/23 0932  Weight: 258 lb 11.2 oz (117.3 kg)    Physical Exam Constitutional:      General: He is not in acute distress. HENT:     Head: Normocephalic and atraumatic.  Eyes:     General: No scleral icterus. Cardiovascular:     Rate and Rhythm: Normal rate.  Pulmonary:     Effort: Pulmonary effort is normal. No respiratory distress.     Breath sounds: No wheezing.  Abdominal:     General: Bowel sounds are normal. There is no distension.  Musculoskeletal:        General: Normal range of motion.     Cervical back: Normal range of motion and neck supple.     Right lower leg: Edema present.     Left lower leg: Edema present.  Skin:    General: Skin is warm and dry.     Findings: No erythema.     Comments: Left butt discoloration with peeling skin.   Neurological:     Mental Status: He is alert and oriented to person, place, and time. Mental status is at baseline.     Cranial Nerves: No cranial nerve deficit.  Psychiatric:        Mood and Affect: Mood normal.     LABORATORY DATA:  I have reviewed the data as listed    Latest Ref Rng & Units 05/27/2023    9:37 AM 05/27/2023    9:23 AM 05/20/2023    7:51 AM  CBC  WBC 4.0 - 10.5 K/uL 3.0  3.1  4.4   Hemoglobin 13.0 - 17.0 g/dL 09.6  04.5  40.9   Hematocrit 39.0 - 52.0 % 33.4  33.7  36.4   Platelets 150 - 400 K/uL 96  96  148       Latest Ref Rng & Units 05/27/2023    9:23 AM 05/20/2023    7:51 AM 05/03/2023  10:46 AM  CMP  Glucose 70 - 99 mg/dL 536  644  66   BUN 8 - 23 mg/dL 8  9  5    Creatinine 0.61 - 1.24 mg/dL 0.34  7.42  5.95   Sodium 135 - 145 mmol/L 140  136  136   Potassium 3.5 - 5.1 mmol/L 3.7  4.2  3.6   Chloride 98 - 111 mmol/L 106  104  103   CO2 22 - 32 mmol/L 26  24  22    Calcium 8.9 - 10.3 mg/dL 8.5  8.9  8.8   Total Protein 6.5 - 8.1 g/dL 6.1  5.8  5.8   Total Bilirubin 0.3 - 1.2 mg/dL 0.5  0.4  0.3   Alkaline Phos 38 - 126 U/L 77  72  81   AST 15 -  41 U/L 25  23  20    ALT 0 - 44 U/L 16  13  9      RADIOGRAPHIC STUDIES: I have personally reviewed the radiological images as listed and agreed with the findings in the report. DG FEMUR, MIN 2 VIEWS RIGHT  Result Date: 05/08/2023 CLINICAL DATA:  Right thigh pain EXAM: RIGHT FEMUR 2 VIEWS COMPARISON:  PET CT 12/10/2022 FINDINGS: No fracture or malalignment. Vascular calcifications. Lytic lesion involving the right ischial tuberosity corresponding to history of known metastatic disease. Mild to moderate arthritis of right hip with joint space narrowing and osteophytes. IMPRESSION: 1. No acute osseous abnormality. 2. Lytic lesion involving the right ischial tuberosity corresponding to history of known metastatic disease. 3. Mild to moderate arthritis of the right hip. Electronically Signed   By: Jasmine Pang M.D.   On: 05/08/2023 19:59

## 2023-05-27 NOTE — Assessment & Plan Note (Signed)
Continue calcium supplementation, currently on 1100 mg twice daily.   Recommend Vitamin D 1000 units daily.

## 2023-05-29 ENCOUNTER — Encounter: Payer: Self-pay | Admitting: Radiation Oncology

## 2023-05-29 ENCOUNTER — Other Ambulatory Visit: Payer: Self-pay

## 2023-05-29 ENCOUNTER — Ambulatory Visit: Payer: BC Managed Care – PPO | Admitting: Radiation Oncology

## 2023-05-29 ENCOUNTER — Encounter: Payer: Self-pay | Admitting: Oncology

## 2023-05-29 ENCOUNTER — Telehealth: Payer: Self-pay

## 2023-05-29 VITALS — BP 138/65 | HR 70 | Temp 97.8°F | Resp 16

## 2023-05-29 DIAGNOSIS — I4891 Unspecified atrial fibrillation: Secondary | ICD-10-CM | POA: Diagnosis not present

## 2023-05-29 DIAGNOSIS — Z5111 Encounter for antineoplastic chemotherapy: Secondary | ICD-10-CM | POA: Diagnosis not present

## 2023-05-29 DIAGNOSIS — C7951 Secondary malignant neoplasm of bone: Secondary | ICD-10-CM | POA: Diagnosis not present

## 2023-05-29 DIAGNOSIS — D6861 Antiphospholipid syndrome: Secondary | ICD-10-CM | POA: Diagnosis not present

## 2023-05-29 DIAGNOSIS — I429 Cardiomyopathy, unspecified: Secondary | ICD-10-CM | POA: Diagnosis not present

## 2023-05-29 DIAGNOSIS — Z510 Encounter for antineoplastic radiation therapy: Secondary | ICD-10-CM | POA: Diagnosis not present

## 2023-05-29 DIAGNOSIS — R21 Rash and other nonspecific skin eruption: Secondary | ICD-10-CM | POA: Diagnosis not present

## 2023-05-29 DIAGNOSIS — R197 Diarrhea, unspecified: Secondary | ICD-10-CM | POA: Diagnosis not present

## 2023-05-29 DIAGNOSIS — Z86718 Personal history of other venous thrombosis and embolism: Secondary | ICD-10-CM | POA: Diagnosis not present

## 2023-05-29 DIAGNOSIS — Z452 Encounter for adjustment and management of vascular access device: Secondary | ICD-10-CM | POA: Diagnosis not present

## 2023-05-29 DIAGNOSIS — C221 Intrahepatic bile duct carcinoma: Secondary | ICD-10-CM | POA: Diagnosis not present

## 2023-05-29 MED ORDER — OXYCODONE HCL 5 MG PO TABS
5.0000 mg | ORAL_TABLET | ORAL | 0 refills | Status: DC | PRN
  Filled 2023-05-29: qty 60, 10d supply, fill #0

## 2023-05-29 NOTE — Progress Notes (Signed)
Radiation Oncology Follow up Note  Name: Christopher Marsh   Date:   05/29/2023 MRN:  [id number] DOB: 04-19-1961    This 62 y.o. male presents to the clinic today for 1 month follow-up status post palliative radiation therapy to his right hip as well as left ischium for metastatic stage IV cholangiocarcinoma.Marland Kitchen  REFERRING PROVIDER: Nelwyn Salisbury, MD  HPI: Patient is a 62 year old male now out 1 month having completed palliative radiation therapy to his left ischium.  He previously treated to T1 transverse process as well as his right ischial tuberosity.  He developed a recall skin reaction with moist desquamation in his buttocks about 2 weeks ago.  That is healing slowly although definitely improving.  He complains of right hip pain.  He had recent plain films showing advanced arthritis in the right hip.  His previous PET scan shows no evidence of disease in that region.  He is currently using Silvadene cream for his skin reaction..  COMPLICATIONS OF TREATMENT: none  FOLLOW UP COMPLIANCE: keeps appointments   PHYSICAL EXAM:  BP 138/65   Pulse 70   Temp 97.8 F (36.6 C) (Tympanic)   Resp 16  Area of slight moist desquamation which is healing nicely in the left buttocks.  Range of motion of the lower extremities does not elicit pain.  Motor and sensory levels are equal and symmetric in his lower extremities.  RADIOLOGY RESULTS: PET scan and plain films reviewed compatible with above-stated findings  PLAN: Present time I am not going to offer palliative radiation therapy I believe he has problems with significant arthritis in his right hip.  He is already had a hip replacement of his left hip.  He continues treatment through medical oncology for his stage IV cholangiocarcinoma.  I will turn follow-up care over to them I be happy to reevaluate him in the future should that be indicated.  I would like to take this opportunity to thank you for allowing me to participate in the care of your  patient.Carmina Miller, MD

## 2023-05-29 NOTE — Telephone Encounter (Signed)
Called [employer] to follow up on Oxycodone appeal and was told by pharmacy intake that there is a determination date of 06/18/23. Was transferred over to [facility] with appeal department to ask if determination can be expedited. She couldn't guarantee it but said she would work to see what she can do and move up determination date.

## 2023-05-29 NOTE — Addendum Note (Signed)
Addended by: [name] on: 05/29/2023 03:08 PM   Modules accepted: Orders

## 2023-05-30 ENCOUNTER — Inpatient Hospital Stay: Payer: BC Managed Care – PPO | Attending: Oncology

## 2023-05-30 DIAGNOSIS — L598 Other specified disorders of the skin and subcutaneous tissue related to radiation: Secondary | ICD-10-CM | POA: Insufficient documentation

## 2023-05-30 DIAGNOSIS — C221 Intrahepatic bile duct carcinoma: Secondary | ICD-10-CM | POA: Insufficient documentation

## 2023-05-30 DIAGNOSIS — C7951 Secondary malignant neoplasm of bone: Secondary | ICD-10-CM | POA: Insufficient documentation

## 2023-05-30 DIAGNOSIS — Z5111 Encounter for antineoplastic chemotherapy: Secondary | ICD-10-CM | POA: Insufficient documentation

## 2023-05-30 DIAGNOSIS — Z86718 Personal history of other venous thrombosis and embolism: Secondary | ICD-10-CM | POA: Insufficient documentation

## 2023-05-30 DIAGNOSIS — D6861 Antiphospholipid syndrome: Secondary | ICD-10-CM | POA: Insufficient documentation

## 2023-05-30 DIAGNOSIS — Z7901 Long term (current) use of anticoagulants: Secondary | ICD-10-CM | POA: Insufficient documentation

## 2023-05-30 DIAGNOSIS — Z452 Encounter for adjustment and management of vascular access device: Secondary | ICD-10-CM | POA: Insufficient documentation

## 2023-05-30 DIAGNOSIS — I4891 Unspecified atrial fibrillation: Secondary | ICD-10-CM | POA: Insufficient documentation

## 2023-05-30 DIAGNOSIS — R296 Repeated falls: Secondary | ICD-10-CM | POA: Insufficient documentation

## 2023-05-30 NOTE — Progress Notes (Signed)
Nutrition Follow-up:  Patient with cholangiocarcinoma.  Futibatinib has been discontinued and patient started on 3rd line treatment of folfox.    Met with patient in clinic.  Reports that his appetite is better. Has been in pain but recently able to get oxycodone from [employer] (staff working to get insurance to cover).  Ate [employer] from [employer] for lunch today (berries, nuts, chicken).  Last night for dinner ate salad with meatballs and pasta. Taste for meat is starting to return.   States that a few days after chemo appetite decreased but then improved.  Does not really like oral nutrition supplements. Reports that diarrhea is better.    Medications: oxycodone   Labs: Phosphorus 2.5 on 7/29  Anthropometrics:   Weight 258 lb on 7/29 275 lb 11.2 oz on 6/13 287 lb 3.2 oz on 5/29 311 lb on 4/8  6% weight loss in the last month, significant  NUTRITION DIAGNOSIS: Unintentional weight loss related to cancer and related treatment side effects as evidenced by 6% weight loss in the last month and decreased appetite during that time now improving   INTERVENTION:  Liberalize diet.  No longer need for phosphorus restriction as off futibatinib and phosphorus level is normal Encouraged protein food at every meal. Encouraged eating q 2-3 hours     MONITORING, EVALUATION, GOAL: weight trends, intake   NEXT VISIT: ~5 weeks  Sindia Kowalczyk B. Freida Busman, RD, LDN Registered Dietitian [phone]

## 2023-05-30 NOTE — Telephone Encounter (Signed)
Left message on machine for patient to return our call 

## 2023-05-31 NOTE — Telephone Encounter (Signed)
Looks like pt has been scheduled

## 2023-06-03 ENCOUNTER — Inpatient Hospital Stay (HOSPITAL_BASED_OUTPATIENT_CLINIC_OR_DEPARTMENT_OTHER): Payer: BC Managed Care – PPO | Admitting: Oncology

## 2023-06-03 ENCOUNTER — Encounter: Payer: Self-pay | Admitting: Oncology

## 2023-06-03 ENCOUNTER — Inpatient Hospital Stay: Payer: BC Managed Care – PPO

## 2023-06-03 VITALS — BP 111/54 | HR 61 | Temp 98.0°F | Resp 18 | Wt 257.8 lb

## 2023-06-03 DIAGNOSIS — D6861 Antiphospholipid syndrome: Secondary | ICD-10-CM | POA: Diagnosis not present

## 2023-06-03 DIAGNOSIS — I4819 Other persistent atrial fibrillation: Secondary | ICD-10-CM

## 2023-06-03 DIAGNOSIS — C7951 Secondary malignant neoplasm of bone: Secondary | ICD-10-CM

## 2023-06-03 DIAGNOSIS — M25561 Pain in right knee: Secondary | ICD-10-CM | POA: Insufficient documentation

## 2023-06-03 DIAGNOSIS — Z86718 Personal history of other venous thrombosis and embolism: Secondary | ICD-10-CM | POA: Diagnosis not present

## 2023-06-03 DIAGNOSIS — C221 Intrahepatic bile duct carcinoma: Secondary | ICD-10-CM

## 2023-06-03 DIAGNOSIS — R296 Repeated falls: Secondary | ICD-10-CM | POA: Diagnosis not present

## 2023-06-03 DIAGNOSIS — Z5111 Encounter for antineoplastic chemotherapy: Secondary | ICD-10-CM | POA: Diagnosis not present

## 2023-06-03 DIAGNOSIS — R634 Abnormal weight loss: Secondary | ICD-10-CM

## 2023-06-03 DIAGNOSIS — L589 Radiodermatitis, unspecified: Secondary | ICD-10-CM

## 2023-06-03 DIAGNOSIS — Z7901 Long term (current) use of anticoagulants: Secondary | ICD-10-CM | POA: Diagnosis not present

## 2023-06-03 DIAGNOSIS — L598 Other specified disorders of the skin and subcutaneous tissue related to radiation: Secondary | ICD-10-CM | POA: Diagnosis not present

## 2023-06-03 DIAGNOSIS — I4891 Unspecified atrial fibrillation: Secondary | ICD-10-CM | POA: Diagnosis not present

## 2023-06-03 DIAGNOSIS — Z452 Encounter for adjustment and management of vascular access device: Secondary | ICD-10-CM | POA: Diagnosis not present

## 2023-06-03 LAB — CBC WITH DIFFERENTIAL (CANCER CENTER ONLY)
Abs Immature Granulocytes: 0 10*3/uL (ref 0.00–0.07)
Basophils Absolute: 0 10*3/uL (ref 0.0–0.1)
Basophils Relative: 1 %
Eosinophils Absolute: 0 10*3/uL (ref 0.0–0.5)
Eosinophils Relative: 1 %
HCT: 33.1 % — ABNORMAL LOW (ref 39.0–52.0)
Hemoglobin: 10.4 g/dL — ABNORMAL LOW (ref 13.0–17.0)
Immature Granulocytes: 0 %
Lymphocytes Relative: 7 %
Lymphs Abs: 0.2 10*3/uL — ABNORMAL LOW (ref 0.7–4.0)
MCH: 28.5 pg (ref 26.0–34.0)
MCHC: 31.4 g/dL (ref 30.0–36.0)
MCV: 90.7 fL (ref 80.0–100.0)
Monocytes Absolute: 0.5 10*3/uL (ref 0.1–1.0)
Monocytes Relative: 16 %
Neutro Abs: 2.4 10*3/uL (ref 1.7–7.7)
Neutrophils Relative %: 75 %
Platelet Count: 131 10*3/uL — ABNORMAL LOW (ref 150–400)
RBC: 3.65 MIL/uL — ABNORMAL LOW (ref 4.22–5.81)
RDW: 15.7 % — ABNORMAL HIGH (ref 11.5–15.5)
WBC Count: 3.1 10*3/uL — ABNORMAL LOW (ref 4.0–10.5)
nRBC: 0 % (ref 0.0–0.2)

## 2023-06-03 LAB — CMP (CANCER CENTER ONLY)
ALT: 14 U/L (ref 0–44)
AST: 26 U/L (ref 15–41)
Albumin: 3.1 g/dL — ABNORMAL LOW (ref 3.5–5.0)
Alkaline Phosphatase: 70 U/L (ref 38–126)
Anion gap: 6 (ref 5–15)
BUN: 5 mg/dL — ABNORMAL LOW (ref 8–23)
CO2: 26 mmol/L (ref 22–32)
Calcium: 8.8 mg/dL — ABNORMAL LOW (ref 8.9–10.3)
Chloride: 102 mmol/L (ref 98–111)
Creatinine: 0.83 mg/dL (ref 0.61–1.24)
GFR, Estimated: >60 mL/min (ref >60)
Glucose, Bld: 98 mg/dL (ref 70–99)
Potassium: 3.5 mmol/L (ref 3.5–5.1)
Sodium: 134 mmol/L — ABNORMAL LOW (ref 135–145)
Total Bilirubin: 0.8 mg/dL (ref 0.3–1.2)
Total Protein: 5.8 g/dL — ABNORMAL LOW (ref 6.5–8.1)

## 2023-06-03 MED ORDER — FENTANYL 50 MCG/HR TD PT72: 1.0000 | MEDICATED_PATCH | TRANSDERMAL | 0 refills | Status: DC

## 2023-06-03 MED ORDER — SODIUM CHLORIDE 0.9 % IV SOLN
10.0000 mg | Freq: Once | INTRAVENOUS | Status: AC
  Administered 2023-06-03: 10 mg via INTRAVENOUS
  Filled 2023-06-03: qty 10

## 2023-06-03 MED ORDER — DEXTROSE 5 % IV SOLN
Freq: Once | INTRAVENOUS | Status: AC
  Filled 2023-06-03: qty 250

## 2023-06-03 MED ORDER — PALONOSETRON HCL INJECTION 0.25 MG/5ML
0.2500 mg | Freq: Once | INTRAVENOUS | Status: AC
  Administered 2023-06-03: 0.25 mg via INTRAVENOUS
  Filled 2023-06-03: qty 5

## 2023-06-03 MED ORDER — OXALIPLATIN CHEMO INJECTION 100 MG/20ML
85.0000 mg/m2 | Freq: Once | INTRAVENOUS | Status: AC
  Administered 2023-06-03: 200 mg via INTRAVENOUS
  Filled 2023-06-03: qty 40

## 2023-06-03 MED ORDER — SODIUM CHLORIDE 0.9 % IV SOLN
2400.0000 mg/m2 | INTRAVENOUS | Status: AC
  Administered 2023-06-03: 6050 mg via INTRAVENOUS
  Filled 2023-06-03: qty 121

## 2023-06-03 MED ORDER — LEUCOVORIN CALCIUM INJECTION 350 MG
400.0000 mg/m2 | Freq: Once | INTRAVENOUS | Status: AC
  Administered 2023-06-03: 1020 mg via INTRAVENOUS
  Filled 2023-06-03: qty 51

## 2023-06-03 MED ORDER — FLUOROURACIL CHEMO INJECTION 2.5 GM/50ML
400.0000 mg/m2 | Freq: Once | INTRAVENOUS | Status: AC
  Administered 2023-06-03: 1000 mg via INTRAVENOUS
  Filled 2023-06-03: qty 20

## 2023-06-03 NOTE — Assessment & Plan Note (Signed)
Likely due to recent fall. Recommend patient to get ortho evaluation.

## 2023-06-03 NOTE — Assessment & Plan Note (Signed)
Follow with nutritionist.

## 2023-06-03 NOTE — Assessment & Plan Note (Signed)
#

## 2023-06-03 NOTE — Addendum Note (Signed)
Addended by: [name] on: 06/03/2023 03:45 PM   Modules accepted: Orders

## 2023-06-03 NOTE — Assessment & Plan Note (Addendum)
S/p palliative  radiation  Consider bisphosphonate after dental clearance is obtained. S/p left ischium.  radiation due to concern of pathological fracture.  Right thigh pain, no RT per Radonc. Felt was due to arthritis. Recommend patient to discuss with Orthopedic Surgeon.

## 2023-06-03 NOTE — Assessment & Plan Note (Addendum)
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 [facility] 2nd opinion was reviewed.  Recommend to proceed with 3rd line treatment with FOLFOX  Labs are reviewed and discussed with patient. Proceed with FOLFOX

## 2023-06-03 NOTE — Progress Notes (Signed)
Pt here for follow up. Pt reports that pain to right leg continues but he is taking Oxycodone and it is helping. Pt reports dermatitis is healing well.

## 2023-06-03 NOTE — Assessment & Plan Note (Signed)
radiation dermatitis, cellulitis, finished antibiotics.  Continue topical  Silver Sulfadiazine, non adhesive wound dressing until skin is completely healed.  Use seat cushion, rotation position, monitor the area.

## 2023-06-03 NOTE — Patient Instructions (Signed)
[facility] [place] [facility]  Discharge Instructions: Thank you for choosing [facility] to provide your oncology and hematology care.  If you have a lab appointment with the Cancer Center, please go directly to the Cancer Center and check in at the registration area.  Wear comfortable clothing and clothing appropriate for easy access to any Portacath or PICC line.   We strive to give you quality time with your provider. You may need to reschedule your appointment if you arrive late (15 or more minutes).  Arriving late affects you and other patients whose appointments are after yours.  Also, if you miss three or more appointments without notifying the office, you may be dismissed from the clinic at the provider's discretion.      For prescription refill requests, have your pharmacy contact our office and allow 72 hours for refills to be completed.    Today you received the following chemotherapy and/or immunotherapy agents- oxaliplatin, leucovorin, 5FU      To help prevent nausea and vomiting after your treatment, we encourage you to take your nausea medication as directed.  BELOW ARE SYMPTOMS THAT SHOULD BE REPORTED IMMEDIATELY: *FEVER GREATER THAN 100.4 F (38 C) OR HIGHER *CHILLS OR SWEATING *NAUSEA AND VOMITING THAT IS NOT CONTROLLED WITH YOUR NAUSEA MEDICATION *UNUSUAL SHORTNESS OF BREATH *UNUSUAL BRUISING OR BLEEDING *URINARY PROBLEMS (pain or burning when urinating, or frequent urination) *BOWEL PROBLEMS (unusual diarrhea, constipation, pain near the anus) TENDERNESS IN MOUTH AND THROAT WITH OR WITHOUT PRESENCE OF ULCERS (sore throat, sores in mouth, or a toothache) UNUSUAL RASH, SWELLING OR PAIN  UNUSUAL VAGINAL DISCHARGE OR ITCHING   Items with * indicate a potential emergency and should be followed up as soon as possible or go to the Emergency Department if any problems should occur.  Please show the CHEMOTHERAPY ALERT CARD or IMMUNOTHERAPY ALERT  CARD at check-in to the Emergency Department and triage nurse.  Should you have questions after your visit or need to cancel or reschedule your appointment, please contact [facility] [place] [place] [facility]  [phone] and follow the prompts.  Office hours are 8:00 a.m. to 4:30 p.m. Monday - Friday. Please note that voicemails left after 4:00 p.m. may not be returned until the following business day.  We are closed weekends and major holidays. You have access to a nurse at all times for urgent questions. Please call the main number to the clinic [phone] and follow the prompts.  For any non-urgent questions, you may also contact your provider using MyChart. We now offer e-Visits for anyone 18 and older to request care online for non-urgent symptoms. For details visit [email or web address].   Also download the MyChart app! Go to the app store, search "MyChart", open the app, select , and log in with your MyChart username and password.    

## 2023-06-03 NOTE — Progress Notes (Signed)
Hematology/Oncology Progress note [phone] [phone]    CHIEF COMPLAINTS/REASON FOR VISIT:  Follow up for right lower extremity DVT, antiphospholipid syndrome, metastatic cholangiocarcinoma.  ASSESSMENT & PLAN:   Cancer Staging  Cholangiocarcinoma [facility]) Staging form: Intrahepatic Bile Duct, AJCC 8th Edition - Clinical stage from 08/27/2022: Stage IV (cT2, cN1, cM1) - Signed by [name], MD on 09/13/2022   Cholangiocarcinoma (HCC) Cholangiocarcinoma, NGS-Tempus liquid biopsy FGFR2 -ADK mutation, TMB 0,  S/p Gemcitabine, Cisplatin and Durvalumab x 6 , 01/2023 CT progression/enlarged liver lesions --03/03/23 2nd line Futibatinib- 04/26/23 CT showed interval progression  Recommend patient to stop Futibatinib. [facility] 2nd opinion was reviewed.  Recommend to proceed with 3rd line treatment with FOLFOX  Labs are reviewed and discussed with patient. Proceed with FOLFOX   Antiphospholipid syndrome (HCC) #Recurrent lower extremity DVT, secondary to antiphospholipid syndrome.-. Need  life time anticoagulation Continue  Lovenox 1 mg/kg twice daily, as of today, recommend 120 mg BID as he has lost weight.  Currently his platelet count is stable, off cytoreductive chemotherapy. Recommend patient to switch to Coumadin with INR goal of 2-3.   Referral to Coumadin clinic.  He has not established care yet.   Hypocalcemia Continue calcium supplementation, currently on 1100 mg twice daily.   Recommend Vitamin D 1000 units daily.   Metastasis to bone [facility]) S/p palliative  radiation  Consider bisphosphonate after dental clearance is obtained. S/p left ischium.  radiation due to concern of pathological fracture.  Right thigh pain, no RT per Radonc. Felt was due to arthritis. Recommend patient to discuss with Orthopedic Surgeon.    Atrial fibrillation (HCC) And cardiomyopathy, follow up with cardiology Rate controlled. S/p ablation on 03/26/23   Radiation  dermatitis radiation dermatitis, cellulitis, finished antibiotics.  Continue topical  Silver Sulfadiazine, non adhesive wound dressing until skin is completely healed.  Use seat cushion, rotation position, monitor the area.    Weight loss Follow with nutritionist.   Right knee pain Likely due to recent fall. Recommend patient to get ortho evaluation.   Orders Placed This Encounter  Procedures   Cancer antigen 19-9    Standing Status:   Future    Standing Expiration Date:   06/16/2024   CEA    Standing Status:   Future    Standing Expiration Date:   06/16/2024   CBC with Differential (Cancer Center Only)    Standing Status:   Future    Standing Expiration Date:   06/16/2024   CMP (Cancer Center only)    Standing Status:   Future    Standing Expiration Date:   06/16/2024   Cancer antigen 19-9    Standing Status:   Future    Standing Expiration Date:   06/30/2024   CEA    Standing Status:   Future    Standing Expiration Date:   06/30/2024   CBC with Differential (Cancer Center Only)    Standing Status:   Future    Standing Expiration Date:   06/30/2024   CMP (Cancer Center only)    Standing Status:   Future    Standing Expiration Date:   06/30/2024   Follow up 2 weeks.   All questions were answered. The patient knows to call the clinic with any problems, questions or concerns.  [name], MD, PhD [facility] Hematology Oncology 06/03/2023    HISTORY OF PRESENTING ILLNESS:  Patient reports remote history of left lower extremity DVT in 2002.  He was initiated on Lovenox and bridged to Coumadin.  Patient took warfarin for 2 years before anticoagulation was stopped. 12/03/2021, patient presented to emergency room for evaluation of right lower extremity pain and swelling for about a week.  Started on the inner side of right thigh and migrated to the right calf. + Associated with swelling.  Denies any recent injury, hospitalization, surgery.  He first noticed the symptoms after playing  basketball with his grandson.   12/03/2021, right lower extremity ultrasound showed occlusive DVT extending from the mid aspect of the femoral vein through the imaged tibial vein.  Age-indeterminate. Patient was started on Xarelto. He was referred to establish care with vascular surgeon and was seen by Sheppard Plumber on 12/06/2021.  Shared decision was made not to proceed with embolectomy.  Continue anticoagulation. Patient was referred to hematology oncology for further evaluation.  Patient denies any family history of blood clots.  Denies any unintentional weight loss, fever or night sweats. 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  He sometimes stay in his car while waiting for next assessment appointment.  He reports the right lower extremity symptom has improved since the start of Xarelto.  No active bleeding events.  #12/18/2021 hypercoagulable work-up showed JAK2 V617F mutation negative, with reflex to other mutations CALR, MPL, JAK 2 Ex 12-15 mutations negative, negative anticardiolipin IgG and IgM antibodies, positive lupus anticoagulant, negative factor V Leiden mutation, negative prothrombin gene mutation, normal protein C activity, normal protein S antigen level. Patient was recommended to switch to Coumadin with INR goal of 2-3.  #03/15/2022, repeat lupus anticoagulant is persistently positive- + antiphospholipid syndrome.  Patient is currently on Lovenox 1 mg/kg twice daily.  admitted due to multifocal pneumonia, treated with Influenza panel negative, COVID-negative, HIV negative, Complete course of antibiotics with Zithromax, Vantin   + HCV antibody positive, HCV RNA quantification not detected. HCV RT PCR not detected.   INTERVAL HISTORY Christopher Marsh is a 62 y.o. male who has above history reviewed by me today presents for follow up visit for management of right lower extremity DVT and  antiphospholipid syndrome, metastatic cholangiocarcinoma.  Oncology History  Cholangiocarcinoma (HCC)  10/30/2021 Imaging   CT chest wo contrast 1. Heterogeneous bilateral ground-glass disease, multilobar but worst in the right middle lobe, lingula and lower lobes. There is progression of the abnormality since 10/12/2022. Though pneumonia could produce this appearance, given persistence and fluctuating appearance since October, consider alternative entities such as hypersensitivity pneumonitis or idiopathic interstitial pneumonia. 2. Slightly enlarged pulmonary trunk, possible arterial hypertension 3. Redemonstrated large hypodense right hepatic lobe mass.   08/18/2022 Imaging   CT chest w contrast showed 1. Multifocal airspace disease in both lungs, left side greater than right. Findings are most compatible with multifocal pneumonia. 2. Multiple new hepatic lesions with enlarging lymph nodes in the upper abdomen and chest. Findings are concerning for metastatic disease. Based on the multifocal pneumonia, hepatic abscesses would also be in the differential diagnosis. Recommend further characterization of the abdomen and pelvis with CT with IV contrast. 3. Enlargement of the main pulmonary artery could be associated with pulmonary hypertension. 4. Coronary artery calcifications. 5. Multinodular goiter. Patient has known thyroid nodules and previous thyroid ultrasound.    08/18/2022 Imaging   CT abdomen pelvis w contrast  Interval development of 6.5 x 5.2 cm heterogeneously enhancing mass in right hepatic lobe. This is highly concerning for neoplasm or malignancy, and further evaluation with MRI is recommended.   Mildly  enlarged periaortic adenopathy is noted, with the largest lymph node measuring 12 mm. Metastatic disease cannot be excluded. Mildly enlarged adenopathy is also noted in the gastrohepatic ligament, but this is unchanged compared to prior exam.   Stable bibasilar lung opacities are  noted concerning for inflammation, atelectasis or possibly scarring.   Grossly stable multi-septated cystic lesion is seen involving upper pole of right kidney with peripheral calcifications compared to prior exam of 2019, most likely representing benign etiology.   Aortic Atherosclerosis    08/18/2022 - 08/21/2022 Hospital Admission   Admitted due to multifocal pneumonia, treated with Influenza panel negative, COVID-negative, HIV negative, Complete course of antibiotics with Zithromax, Vantin     08/19/2022 Imaging   MR abdomen w wo contrast  Marked caudate lobe hypertrophy, highly suspicious for hepatic cirrhosis.   Numerous hypervascular masses throughout the right hepatic lobe, highly suspicious for multifocal hepatocellular carcinoma. Recommend correlation with AFP and consider tissue sampling.   Mild abdominal lymphadenopathy, with differential diagnosis including metastatic disease and reactive lymphadenopathy in setting of cirrhosis.   Bilateral lower lobe infiltrates, as better demonstrated on recent CT.    08/19/2022 Tumor Marker   AFP 4.1   08/27/2022 Initial Diagnosis   Cholangiocarcinoma  -08/20/22 Liver mass biopsy showed poorly differentiated carcinoma Immunohistochemical stains show that the poorly differentiated carcinoma is positive for CK7 and MOC-31, suggestive of a poorly differentiated adenocarcinoma.  Immunohistochemical stains for CK20, CDX2, HepPar 1 and arginase are negative.  Immunostain for glypican-3 shows very focal  likely nonspecific labeling.  This immunoprofile is nonspecific but can be compatible with a poorly differentiated primary cholangiocarcinoma in absence of any other lesions.   NGS showed BAP 1 mutation.  Case was presented at tumor board, I have also discussed with pathologist Dr.Rubinas,  IHC pattern is more consistent with adenocarcinoma, unlikely HCC [due to negative HepPar 1 and arginase], cholangiocarcinoma is favored, although this is  a diagnosis of exclusion. Second opinion ar [facility]   08/27/2022 Cancer Staging   Staging form: Intrahepatic Bile Duct, AJCC 8th Edition - Clinical stage from 08/27/2022: Stage IV (cT2, cN1, cM1) - Signed by [name], MD on 09/13/2022 Stage prefix: Initial diagnosis   08/27/2022 Tumor Marker   CA 19.9  16 CEA 2.8   09/12/2022 Imaging   PET scan showed 1. Large right hepatic lobe mass is hypermetabolic and consistent with known cholangiocarcinoma. 2. Scattered borderline enlarged upper abdominal lymph nodes do not show any significant FDG uptake. 3. 4 hypermetabolic bone lesions consistent with metastatic disease. 4. Progressive diffuse airspace process in the left lung could be due to atypical pneumonia, pulmonary hemorrhage or left-sided aspiration. 5. Aortic atherosclerosis.  09/12/2022 I had a phone discussion with patient after his PET scan resulted.  Recommend systemic chemotherapy.  Patient would like to defer until his second opinion visit at [facility].   09/15/2022 Imaging   CT angio chest pulmonary embolism protocol showed 1. No evidence for pulmonary embolism. 2. Progression of bilateral multifocal patchy ground-glass and interstitial opacities, left greater than right. Findings are concerning for multifocal pneumonia. 3. Air-fluid levels in the left lower lobe bronchi worrisome for aspiration. 4. Mediastinal and hilar lymphadenopathy has increased in size and number when compared to the prior study, likely reactive.5. Cardiomegaly. 6. Findings compatible with pulmonary artery hypertension.7. Stable right liver mass and complex right renal cystic lesion. 8. Stable enlargement of the left thyroid.   09/15/2022 - 09/17/2022 Hospital Admission   Patient was admitted due to shortness of breath, CT showed  progression of bilateral multifocal patchy groundglass and interstitial opacities.  Left greater than right.  Concerning for multifocal pneumonia.  Patient was started on broad-spectrum  antibiotics with significant improvement.  He was discharged on oral antibiotics.   09/19/2022 Miscellaneous   Patient went to [facility] for second opinion.  He was seen by Shirlee Latch, who felt that the presentation is consistent with metastatic cholangiocarcinoma, and agrees with the plan of cisplatin/gemcitabine/durvalumab..    10/11/2022 - 01/31/2023 Chemotherapy   Patient is on Treatment Plan : Cisplatin D1,8 + Gemcitabine + Durvalumab  D1,8 q21d x     10/12/2022 Imaging   CT chest w contrast 1. Bilateral airspace and ground-glass opacities have mildly decreased in amount and density when compared to the prior study. Findings are compatible with resolving multifocal pneumonia. Continued follow-up imaging recommended. 2. Stable enlarged heterogeneous left thyroid gland with 3.2 cm nodule. This can be further evaluated with dedicated ultrasound. 3. Stable hepatic and right renal lesions, incompletely evaluated.   Aortic Atherosclerosis (ICD10-I70.0).   12/11/2022 Imaging   PET showed  1. There is been mild increase in size of the tracer avid mass within right lobe of liver compatible with known cholangiocarcinoma. The degree of tracer uptake however is slightly decreased in the interval. 2. Multifocal tracer avid bone metastases are again noted. When compared with the previous exam there has been an mixed interval response to therapy. Specifically, there has been interval increase in size and FDG uptake associated with the right ischial tuberosity lesion and there is a new lesion within the posterior left iliac bone. Decreased tracer uptake is associated with the left scapular lesion and there is stable tracer uptake associated with the right T1 transverse process lesion. 3. Persistent ground-glass and airspace densities within the left upper lobe and left lower lobe compatible with inflammatory or infectious process. Interval resolution of previous right lung ground-glass and airspace densities.     12/24/2022 Procedure   Medi port placement by Dr. Wyn Quaker   01/22/2023 Imaging   CT chest abdomen pelvis with contrast 1. Hypodense lesion of the right lobe of the liver, hepatic segment VII/VIII is slightly diminished in size, consistent with treatment response. 2. No evident change in additional smaller hypodense lesions scattered throughout the right lobe of the liver, these more difficult to see on prior noncontrast PET-CT. 3. Unchanged enlarged celiac axis, gastrohepatic ligament, and retroperitoneal lymph nodes. Unchanged prominent high right paratracheal lymph nodes. 4. Unchanged osseous metastases. 5. Slightly improved, although still extensive ground-glass airspace opacity primarily seen throughout the left lower lobe and lingula,nonspecific and infectious or inflammatory. 6. Coronary artery disease. 7. Emphysema.   02/11/2023 Imaging   Patient self decreased Lovenox dosage to 120mg  once daily.  Increased lower extremity swelling.   02/11/23 [place] bilateral lower extremity showed 1. Nonocclusive DVT within the popliteal veins bilaterally. 2. Occlusive DVT within the posterior tibial veins of the right calf.       02/13/2023 Imaging   CT chest Angiogram  1. Technically adequate exam showing no acute pulmonary embolus. 2. Cardiomegaly. Coronary artery calcifications. 3. Bilateral pleural effusions. 4. Improved aeration of the RIGHT UPPER and LOWER lobes compared to prior study. 5. LEFT LOWER lobe and lingula airspace filling and atelectasis, slightly improved compared to prior study. 6. Cirrhotic morphology of the liver. 7.7 centimeters mass in the RIGHT hepatic lobe. 7. Gastrohepatic ligament adenopathy. 8. A 7 millimeters sclerotic lesion in the UPPER sternum is suspicious for metastatic disease. 9. LEFT thyroid goiter.   02/13/2023 -  02/15/2023 Hospital Admission   A flutter with variable A-V block. Heart rate up to 140.  BNP 119.8 04/18: Echo no overt CHF - EF 50-55   Cardiology recs beta blocker to metoprolol succinate 50 mg bid, Torsemid 40mg  daily Adjusting Lovenox 14mg  BID.    03/03/2023 -  Chemotherapy   Futibatinib 20mg   03/12/23 Decreased to 16mg  daily   04/24/2023 - 04/30/2023 Radiation Therapy   left ischium radiation.    05/20/2023 -  Chemotherapy   Patient is on Treatment Plan : COLORECTAL FOLFOX q14d      INTERVAL HISTORY Christopher Marsh is a 62 y.o. male who has above history reviewed by me today presents for follow up visit for Cholangiocarcinoma, antiphospholipid syndrome, thrombosis + Fatigue, diarrhea is better controlled. Appetite is good. Lost weight appetite is not good.  He takes Lovenox 120mg  BID currently.  Left butt blister/cellulitis is much improved.  Right upper thigh pain, Radonc feels the pain is likely due to arthritis rather than bone lesion.  He had a fall recently, landed on his butt, and knee. Right knee pain  Review of Systems  Constitutional:  Positive for fatigue. Negative for appetite change, chills and fever.  HENT:   Negative for hearing loss and voice change.   Eyes:  Negative for eye problems.  Respiratory:  Negative for chest tightness, cough and shortness of breath.   Cardiovascular:  Positive for leg swelling. Negative for chest pain.  Gastrointestinal:  Negative for abdominal distention, abdominal pain, blood in stool and diarrhea.  Endocrine: Negative for hot flashes.  Genitourinary:  Negative for difficulty urinating and frequency.   Musculoskeletal:  Negative for arthralgias.  Skin:  Negative for itching.       Left butt blister is better.   Neurological:  Negative for extremity weakness.  Hematological:  Negative for adenopathy. Bruises/bleeds easily.  Psychiatric/Behavioral:  Negative for confusion.     MEDICAL HISTORY:  Past Medical History:  Diagnosis Date   Antiphospholipid antibody syndrome (HCC)    Arthritis    Cancer (HCC)    Diabetes mellitus without complication (HCC)    DVT (deep  venous thrombosis) (HCC)    Lt leg   Dyspnea    Elevated lipids    Erythrocytosis    Hyperlipidemia    Hypertension    Lower extremity edema    Multinodular thyroid    Post-thrombotic syndrome     SURGICAL HISTORY: Past Surgical History:  Procedure Laterality Date   CARDIOVERSION N/A 03/22/2023   Procedure: CARDIOVERSION;  Surgeon: Antonieta Iba, MD;  Location: [facility] ORS;  Service: Cardiovascular;  Laterality: N/A;   COLONOSCOPY WITH PROPOFOL N/A 09/23/2018   Per Dr. Norma Fredrickson, polyp, repeat in 3 yrs    EYE MUSCLE SURGERY     JOINT REPLACEMENT Left 07/22/2017   PORTA CATH INSERTION N/A 12/24/2022   Procedure: PORTA CATH INSERTION;  Surgeon: Annice Needy, MD;  Location: [facility] INVASIVE CV LAB;  Service: Cardiovascular;  Laterality: N/A;   TOTAL HIP ARTHROPLASTY Left 07/22/2017   Procedure: TOTAL HIP ARTHROPLASTY;  Surgeon: Donato Heinz, MD;  Location: [facility] ORS;  Service: Orthopedics;  Laterality: Left;    SOCIAL HISTORY: Social History   Socioeconomic History   Marital status: Married    Spouse name: Not on file   Number of children: Not on file   Years of education: Not on file   Highest education level: Not on file  Occupational History   Not on file  Tobacco Use   Smoking  status: Former    Current packs/day: 0.00    Average packs/day: 0.5 packs/day for 6.0 years (3.0 ttl pk-yrs)    Types: Cigarettes    Start date: 8    Quit date: 1982    Years since quitting: 42.6   Smokeless tobacco: Never   Tobacco comments:    Pt states when he did smoke he smoked at most 1 ppd. ALS 09/26/2022  Vaping Use   Vaping status: Never Used  Substance and Sexual Activity   Alcohol use: Not Currently    Comment: occ   Drug use: No   Sexual activity: Not on file  Other Topics Concern   Not on file  Social History Narrative   Not on file   Social Determinants of Health   Financial Resource Strain: Low Risk  (08/27/2022)   Overall Financial Resource Strain (CARDIA)     Difficulty of Paying Living Expenses: Not very hard  Food Insecurity: No Food Insecurity (02/18/2023)   Hunger Vital Sign    Worried About Running Out of Food in the Last Year: Never true    Ran Out of Food in the Last Year: Never true  Transportation Needs: No Transportation Needs (02/18/2023)   PRAPARE - Administrator, Civil Service (Medical): No    Lack of Transportation (Non-Medical): No  Physical Activity: Insufficiently Active (08/27/2022)   Exercise Vital Sign    Days of Exercise per Week: 3 days    Minutes of Exercise per Session: 30 min  Stress: Stress Concern Present (08/27/2022)   [employer] of Occupational Health - Occupational Stress Questionnaire    Feeling of Stress : Rather much  Social Connections: Socially Integrated (08/27/2022)   Social Connection and Isolation Panel [NHANES]    Frequency of Communication with Friends and Family: More than three times a week    Frequency of Social Gatherings with Friends and Family: More than three times a week    Attends Religious Services: 1 to 4 times per year    Active Member of [employer] or Organizations: Yes    Attends Banker Meetings: Never    Marital Status: Married  Catering manager Violence: Not At Risk (02/14/2023)   Humiliation, Afraid, Rape, and Kick questionnaire    Fear of Current or Ex-Partner: No    Emotionally Abused: No    Physically Abused: No    Sexually Abused: No    FAMILY HISTORY: Family History  Problem Relation Age of Onset   Breast cancer Mother    Emphysema Father    Colon cancer Maternal Grandmother     ALLERGIES:  has No Known Allergies.  MEDICATIONS:  Current Outpatient Medications  Medication Sig Dispense Refill   albuterol (VENTOLIN HFA) 108 (90 Base) MCG/ACT inhaler TAKE 2 PUFFS BY MOUTH EVERY 4 HOURS AS NEEDED 8.5 each 1   amoxicillin-clavulanate (AUGMENTIN) 875-125 MG tablet Take 1 tablet by mouth 2 (two) times daily. 10 tablet 0   Calcium Carbonate 500 MG  CHEW Chew 2 tablets (1,000 mg total) by mouth daily. 60 tablet 0   diphenoxylate-atropine (LOMOTIL) 2.5-0.025 MG tablet Take 1 tablet by mouth 4 (four) times daily as needed for diarrhea or loose stools. 120 tablet 3   enoxaparin (LOVENOX) 120 MG/0.8ML injection Inject 0.8 mLs (120 mg total) into the skin every 12 (twelve) hours. 48 mL 2   fentaNYL (DURAGESIC) 50 MCG/HR Place 1 patch onto the skin every 3 (three) days. 5 patch 0   glipiZIDE (GLUCOTROL) 10 MG tablet  Take 1 tablet (10 mg total) by mouth 2 (two) times daily before a meal. 180 tablet 3   glucose blood test strip 1 each by Other route as needed for other. accu chek aviva plusUse as instructed     ketoconazole (NIZORAL) 2 % cream Apply 1 Application topically 2 (two) times daily as needed for irritation. 60 g 5   Lactulose 20 GM/30ML SOLN Take 30 mLs (20 g total) by mouth daily as needed (constipation.). 450 mL 0   lidocaine-prilocaine (EMLA) cream Apply 1 Application topically as needed. 30 g 5   LORazepam (ATIVAN) 0.5 MG tablet TAKE 1 TABLET (0.5 MG TOTAL) BY MOUTH EVERY 8 (EIGHT) HOURS AS NEEDED FOR ANXIETY (NAUSEA VOMITING). 60 tablet 0   magnesium chloride (SLOW-MAG) 64 MG TBEC SR tablet Take 1 tablet (64 mg total) by mouth 2 (two) times daily. 60 tablet 0   metFORMIN (GLUCOPHAGE) 500 MG tablet TAKE TWO TABLETS BY MOUTH EVERY MORNING WITH BREAKFAST 180 tablet 1   metoprolol tartrate (LOPRESSOR) 50 MG tablet Take 1 tablet (50 mg total) by mouth 2 (two) times daily. 180 tablet 3   MUCINEX D MAX STRENGTH [id number] MG TB12 Take 1,200 mg by mouth 2 (two) times daily as needed (for congestion or to loosen mucous in the chest).     ondansetron (ZOFRAN) 4 MG tablet Take by mouth.     oxyCODONE (OXY IR/ROXICODONE) 5 MG immediate release tablet Take 1 tablet (5 mg total) by mouth every 4 (four) hours as needed for severe pain. 60 tablet 0   potassium chloride (KLOR-CON) 10 MEQ tablet Take 1 tablet (10 mEq total) by mouth daily. 90 tablet 3    pravastatin (PRAVACHOL) 20 MG tablet Take 1 tablet (20 mg total) by mouth daily. 90 tablet 3   silver sulfADIAZINE (SILVADENE) 1 % cream Apply 1 Application topically daily. 50 g 0   tadalafil (CIALIS) 20 MG tablet Take 0.5-1 tablets (10-20 mg total) by mouth every other day as needed for erectile dysfunction. 20 tablet 11   torsemide (DEMADEX) 20 MG tablet Take 1 tablet (20 mg total) by mouth 2 (two) times daily as needed. 180 tablet 3   traZODone (DESYREL) 50 MG tablet Take 1 tablet (50 mg total) by mouth at bedtime as needed for sleep. 30 tablet 1   triamcinolone cream (KENALOG) 0.1 % Apply 1 Application topically 3 (three) times daily. 80 g 5   No current facility-administered medications for this visit.   Facility-Administered Medications Ordered in Other Visits  Medication Dose Route Frequency Provider Last Rate Last Admin   fluorouracil (ADRUCIL) 6,050 mg in sodium chloride 0.9 % 129 mL chemo infusion  2,400 mg/m2 (Order-Specific) Intravenous 1 day or 1 dose [name], MD       fluorouracil (ADRUCIL) chemo injection 1,000 mg  400 mg/m2 (Order-Specific) Intravenous Once [name], MD       leucovorin 1,020 mg in dextrose 5 % 250 mL infusion  400 mg/m2 (Treatment Plan Recorded) Intravenous Once [name], MD 151 mL/hr at 06/03/23 1037 1,020 mg at 06/03/23 1037   oxaliplatin (ELOXATIN) 200 mg in dextrose 5 % 500 mL chemo infusion  85 mg/m2 (Order-Specific) Intravenous Once [name], MD 270 mL/hr at 06/03/23 1036 200 mg at 06/03/23 1036     PHYSICAL EXAMINATION: ECOG PERFORMANCE STATUS: 1 - Symptomatic but completely ambulatory Vitals:   06/03/23 0828  BP: (!) 111/54  Pulse: 61  Resp: 18  Temp: 98 F (36.7 C)   Filed Weights  06/03/23 0828  Weight: 257 lb 12.8 oz (116.9 kg)    Physical Exam Constitutional:      General: He is not in acute distress. HENT:     Head: Normocephalic and atraumatic.  Eyes:     General: No scleral icterus. Cardiovascular:     Rate and Rhythm:  Normal rate.  Pulmonary:     Effort: Pulmonary effort is normal. No respiratory distress.     Breath sounds: No wheezing.  Abdominal:     General: Bowel sounds are normal. There is no distension.  Musculoskeletal:        General: Normal range of motion.     Cervical back: Normal range of motion and neck supple.     Right lower leg: Edema present.     Left lower leg: Edema present.  Skin:    General: Skin is warm and dry.     Findings: No erythema.  Neurological:     Mental Status: He is alert and oriented to person, place, and time. Mental status is at baseline.     Cranial Nerves: No cranial nerve deficit.  Psychiatric:        Mood and Affect: Mood normal.     LABORATORY DATA:  I have reviewed the data as listed    Latest Ref Rng & Units 06/03/2023    8:03 AM 05/27/2023    9:37 AM 05/27/2023    9:23 AM  CBC  WBC 4.0 - 10.5 K/uL 3.1  3.0  3.1   Hemoglobin 13.0 - 17.0 g/dL 40.9  81.1  91.4   Hematocrit 39.0 - 52.0 % 33.1  33.4  33.7   Platelets 150 - 400 K/uL 131  96  96       Latest Ref Rng & Units 06/03/2023    8:03 AM 05/27/2023    9:23 AM 05/20/2023    7:51 AM  CMP  Glucose 70 - 99 mg/dL 98  782  956   BUN 8 - 23 mg/dL 5  8  9    Creatinine 0.61 - 1.24 mg/dL 2.13  0.86  5.78   Sodium 135 - 145 mmol/L 134  140  136   Potassium 3.5 - 5.1 mmol/L 3.5  3.7  4.2   Chloride 98 - 111 mmol/L 102  106  104   CO2 22 - 32 mmol/L 26  26  24    Calcium 8.9 - 10.3 mg/dL 8.8  8.5  8.9   Total Protein 6.5 - 8.1 g/dL 5.8  6.1  5.8   Total Bilirubin 0.3 - 1.2 mg/dL 0.8  0.5  0.4   Alkaline Phos 38 - 126 U/L 70  77  72   AST 15 - 41 U/L 26  25  23    ALT 0 - 44 U/L 14  16  13      RADIOGRAPHIC STUDIES: I have personally reviewed the radiological images as listed and agreed with the findings in the report. No results found.

## 2023-06-03 NOTE — Assessment & Plan Note (Signed)
And cardiomyopathy, follow up with cardiology Rate controlled. S/p ablation on 03/26/23  

## 2023-06-03 NOTE — Assessment & Plan Note (Signed)
Continue calcium supplementation, currently on 1100 mg twice daily.   Recommend Vitamin D 1000 units daily.

## 2023-06-04 ENCOUNTER — Ambulatory Visit (INDEPENDENT_AMBULATORY_CARE_PROVIDER_SITE_OTHER): Payer: BC Managed Care – PPO | Admitting: Family Medicine

## 2023-06-04 ENCOUNTER — Encounter: Payer: Self-pay | Admitting: Family Medicine

## 2023-06-04 VITALS — BP 130/60 | HR 71 | Temp 98.4°F | Wt 263.0 lb

## 2023-06-04 DIAGNOSIS — C221 Intrahepatic bile duct carcinoma: Secondary | ICD-10-CM

## 2023-06-04 DIAGNOSIS — I10 Essential (primary) hypertension: Secondary | ICD-10-CM

## 2023-06-04 DIAGNOSIS — E119 Type 2 diabetes mellitus without complications: Secondary | ICD-10-CM

## 2023-06-04 DIAGNOSIS — I480 Paroxysmal atrial fibrillation: Secondary | ICD-10-CM

## 2023-06-04 DIAGNOSIS — Z86718 Personal history of other venous thrombosis and embolism: Secondary | ICD-10-CM

## 2023-06-04 MED ORDER — WARFARIN SODIUM 5 MG PO TABS: 5.0000 mg | ORAL_TABLET | Freq: Every day | ORAL | 5 refills | Status: DC

## 2023-06-04 NOTE — Progress Notes (Signed)
   Subjective:    Patient ID: Christopher Marsh, male    DOB: 01/08/1961, 62 y.o.   MRN: [id number]  HPI Here to follow up and to discuss getting back on Coumadin. He sees Dr. 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 He saw her yesterday, and she recommended that he can now get back on Coumadin. He is quite pleased about this because the shots are hard to keep up with. His labs yesterday showed a fasting glucose of 98 and a creatinine of 0.83. He feels fairly well in general except for some fatigue and arthritis pain in the right hip. He has been seeing Cardiology for paroxysmal atrial fibrillation.    Review of Systems  Constitutional:  Positive for fatigue.  Respiratory: Negative.    Cardiovascular:  Positive for leg swelling. Negative for chest pain and palpitations.  Gastrointestinal: Negative.   Genitourinary: Negative.   Musculoskeletal:  Positive for arthralgias.       Objective:   Physical Exam Constitutional:      Appearance: Normal appearance.     Comments: Walks with a cane   Cardiovascular:     Rate and Rhythm: Normal rate and regular rhythm.     Pulses: Normal pulses.     Heart sounds: Normal heart sounds.  Pulmonary:     Effort: Pulmonary effort is normal.     Breath sounds: Normal breath sounds.  Musculoskeletal:     Comments: 4+ edema in  both lower legs   Neurological:     Mental Status: He is alert.           Assessment & Plan:  He seems to be responding to chemotherapy as well as can be expected. His BP is stable. He is in sinus rhythm today. His diabetes seems to be stable. I agree he will be on permanent anticoagulation. We will stop the Lovenox and will start him on Coumadin 5 mg daily. He will take the Lovenox for 3 more days before stopping it. He will follow up here with our Coumadin nurse in weeks. We spent a total of ( 35  ) minutes reviewing records and  discussing these issues.  Gershon Crane, MD

## 2023-06-05 ENCOUNTER — Inpatient Hospital Stay: Payer: BC Managed Care – PPO

## 2023-06-05 VITALS — BP 130/77 | HR 66 | Temp 98.0°F

## 2023-06-05 DIAGNOSIS — Z452 Encounter for adjustment and management of vascular access device: Secondary | ICD-10-CM | POA: Diagnosis not present

## 2023-06-05 DIAGNOSIS — L598 Other specified disorders of the skin and subcutaneous tissue related to radiation: Secondary | ICD-10-CM | POA: Diagnosis not present

## 2023-06-05 DIAGNOSIS — I4891 Unspecified atrial fibrillation: Secondary | ICD-10-CM | POA: Diagnosis not present

## 2023-06-05 DIAGNOSIS — Z86718 Personal history of other venous thrombosis and embolism: Secondary | ICD-10-CM | POA: Diagnosis not present

## 2023-06-05 DIAGNOSIS — C221 Intrahepatic bile duct carcinoma: Secondary | ICD-10-CM

## 2023-06-05 DIAGNOSIS — D6861 Antiphospholipid syndrome: Secondary | ICD-10-CM | POA: Diagnosis not present

## 2023-06-05 DIAGNOSIS — R296 Repeated falls: Secondary | ICD-10-CM | POA: Diagnosis not present

## 2023-06-05 DIAGNOSIS — C7951 Secondary malignant neoplasm of bone: Secondary | ICD-10-CM | POA: Diagnosis not present

## 2023-06-05 DIAGNOSIS — Z5111 Encounter for antineoplastic chemotherapy: Secondary | ICD-10-CM | POA: Diagnosis not present

## 2023-06-05 DIAGNOSIS — Z7901 Long term (current) use of anticoagulants: Secondary | ICD-10-CM | POA: Diagnosis not present

## 2023-06-05 MED ORDER — HEPARIN SOD (PORK) LOCK FLUSH 100 UNIT/ML IV SOLN
500.0000 [IU] | Freq: Once | INTRAVENOUS | Status: AC | PRN
  Administered 2023-06-05: 500 [IU]
  Filled 2023-06-05: qty 5

## 2023-06-05 MED ORDER — SODIUM CHLORIDE 0.9% FLUSH
10.0000 mL | INTRAVENOUS | Status: DC | PRN
  Administered 2023-06-05: 10 mL
  Filled 2023-06-05: qty 10

## 2023-06-05 NOTE — Patient Instructions (Signed)

## 2023-06-06 ENCOUNTER — Other Ambulatory Visit: Payer: Self-pay | Admitting: Oncology

## 2023-06-08 ENCOUNTER — Other Ambulatory Visit: Payer: Self-pay | Admitting: Oncology

## 2023-06-09 ENCOUNTER — Other Ambulatory Visit: Payer: Self-pay

## 2023-06-09 ENCOUNTER — Other Ambulatory Visit: Payer: Self-pay | Admitting: Oncology

## 2023-06-10 ENCOUNTER — Encounter: Payer: Self-pay | Admitting: Oncology

## 2023-06-10 ENCOUNTER — Other Ambulatory Visit: Payer: Self-pay

## 2023-06-11 ENCOUNTER — Other Ambulatory Visit: Payer: Self-pay

## 2023-06-11 ENCOUNTER — Encounter: Payer: Self-pay | Admitting: Oncology

## 2023-06-11 MED FILL — Oxycodone HCl Tab 5 MG: ORAL | 10 days supply | Qty: 60 | Fill #0 | Status: AC

## 2023-06-12 ENCOUNTER — Encounter: Payer: Self-pay | Admitting: Oncology

## 2023-06-13 ENCOUNTER — Other Ambulatory Visit: Payer: Self-pay

## 2023-06-13 NOTE — Progress Notes (Signed)
Error

## 2023-06-16 MED FILL — Dexamethasone Sodium Phosphate Inj 100 MG/10ML: INTRAMUSCULAR | Qty: 1 | Status: AC

## 2023-06-17 ENCOUNTER — Encounter: Payer: Self-pay | Admitting: Oncology

## 2023-06-17 ENCOUNTER — Other Ambulatory Visit: Payer: Self-pay

## 2023-06-17 ENCOUNTER — Inpatient Hospital Stay: Payer: BC Managed Care – PPO

## 2023-06-17 ENCOUNTER — Ambulatory Visit
Admission: RE | Admit: 2023-06-17 | Discharge: 2023-06-17 | Disposition: A | Discharge status: Home / Self Care | Payer: BC Managed Care – PPO | Source: Ambulatory Visit | Attending: Oncology | Admitting: Oncology

## 2023-06-17 ENCOUNTER — Ambulatory Visit: Payer: BC Managed Care – PPO | Admitting: Radiation Oncology

## 2023-06-17 ENCOUNTER — Telehealth: Payer: Self-pay

## 2023-06-17 ENCOUNTER — Ambulatory Visit (INDEPENDENT_AMBULATORY_CARE_PROVIDER_SITE_OTHER): Payer: BC Managed Care – PPO

## 2023-06-17 ENCOUNTER — Inpatient Hospital Stay (HOSPITAL_BASED_OUTPATIENT_CLINIC_OR_DEPARTMENT_OTHER): Payer: BC Managed Care – PPO | Admitting: Oncology

## 2023-06-17 VITALS — BP 145/89 | HR 63 | Temp 96.7°F | Resp 18 | Wt 249.4 lb

## 2023-06-17 DIAGNOSIS — M898X9 Other specified disorders of bone, unspecified site: Secondary | ICD-10-CM | POA: Diagnosis not present

## 2023-06-17 DIAGNOSIS — Z7901 Long term (current) use of anticoagulants: Secondary | ICD-10-CM

## 2023-06-17 DIAGNOSIS — C221 Intrahepatic bile duct carcinoma: Secondary | ICD-10-CM

## 2023-06-17 DIAGNOSIS — I480 Paroxysmal atrial fibrillation: Secondary | ICD-10-CM

## 2023-06-17 DIAGNOSIS — R296 Repeated falls: Secondary | ICD-10-CM

## 2023-06-17 DIAGNOSIS — L598 Other specified disorders of the skin and subcutaneous tissue related to radiation: Secondary | ICD-10-CM | POA: Diagnosis not present

## 2023-06-17 DIAGNOSIS — M25551 Pain in right hip: Secondary | ICD-10-CM | POA: Insufficient documentation

## 2023-06-17 DIAGNOSIS — C7951 Secondary malignant neoplasm of bone: Secondary | ICD-10-CM

## 2023-06-17 DIAGNOSIS — D6861 Antiphospholipid syndrome: Secondary | ICD-10-CM

## 2023-06-17 DIAGNOSIS — M898X5 Other specified disorders of bone, thigh: Secondary | ICD-10-CM

## 2023-06-17 DIAGNOSIS — Z5111 Encounter for antineoplastic chemotherapy: Secondary | ICD-10-CM | POA: Diagnosis not present

## 2023-06-17 DIAGNOSIS — I4891 Unspecified atrial fibrillation: Secondary | ICD-10-CM | POA: Diagnosis not present

## 2023-06-17 DIAGNOSIS — Z86718 Personal history of other venous thrombosis and embolism: Secondary | ICD-10-CM | POA: Diagnosis not present

## 2023-06-17 DIAGNOSIS — Z452 Encounter for adjustment and management of vascular access device: Secondary | ICD-10-CM | POA: Diagnosis not present

## 2023-06-17 DIAGNOSIS — R634 Abnormal weight loss: Secondary | ICD-10-CM

## 2023-06-17 DIAGNOSIS — M1611 Unilateral primary osteoarthritis, right hip: Secondary | ICD-10-CM | POA: Diagnosis not present

## 2023-06-17 DIAGNOSIS — Z96641 Presence of right artificial hip joint: Secondary | ICD-10-CM | POA: Diagnosis not present

## 2023-06-17 LAB — CBC WITH DIFFERENTIAL (CANCER CENTER ONLY)
Abs Immature Granulocytes: 0.02 10*3/uL (ref 0.00–0.07)
Basophils Absolute: 0 10*3/uL (ref 0.0–0.1)
Basophils Relative: 1 %
Eosinophils Absolute: 0 10*3/uL (ref 0.0–0.5)
Eosinophils Relative: 0 %
HCT: 34.8 % — ABNORMAL LOW (ref 39.0–52.0)
Hemoglobin: 11.1 g/dL — ABNORMAL LOW (ref 13.0–17.0)
Immature Granulocytes: 1 %
Lymphocytes Relative: 9 %
Lymphs Abs: 0.4 10*3/uL — ABNORMAL LOW (ref 0.7–4.0)
MCH: 28.9 pg (ref 26.0–34.0)
MCHC: 31.9 g/dL (ref 30.0–36.0)
MCV: 90.6 fL (ref 80.0–100.0)
Monocytes Absolute: 0.7 10*3/uL (ref 0.1–1.0)
Monocytes Relative: 17 %
Neutro Abs: 3 10*3/uL (ref 1.7–7.7)
Neutrophils Relative %: 72 %
Platelet Count: 116 10*3/uL — ABNORMAL LOW (ref 150–400)
RBC: 3.84 MIL/uL — ABNORMAL LOW (ref 4.22–5.81)
RDW: 16.9 % — ABNORMAL HIGH (ref 11.5–15.5)
WBC Count: 4.2 10*3/uL (ref 4.0–10.5)
nRBC: 0 % (ref 0.0–0.2)

## 2023-06-17 LAB — CMP (CANCER CENTER ONLY)
ALT: 19 U/L (ref 0–44)
AST: 38 U/L (ref 15–41)
Albumin: 3.3 g/dL — ABNORMAL LOW (ref 3.5–5.0)
Alkaline Phosphatase: 94 U/L (ref 38–126)
Anion gap: 7 (ref 5–15)
BUN: 9 mg/dL (ref 8–23)
CO2: 24 mmol/L (ref 22–32)
Calcium: 8.6 mg/dL — ABNORMAL LOW (ref 8.9–10.3)
Chloride: 100 mmol/L (ref 98–111)
Creatinine: 0.76 mg/dL (ref 0.61–1.24)
GFR, Estimated: >60 mL/min (ref >60)
Glucose, Bld: 57 mg/dL — ABNORMAL LOW (ref 70–99)
Potassium: 3.9 mmol/L (ref 3.5–5.1)
Sodium: 131 mmol/L — ABNORMAL LOW (ref 135–145)
Total Bilirubin: 0.9 mg/dL (ref 0.3–1.2)
Total Protein: 6.1 g/dL — ABNORMAL LOW (ref 6.5–8.1)

## 2023-06-17 LAB — PROTIME-INR
INR: 5.6 (ref 0.8–1.2)
Prothrombin Time: 50.6 s — ABNORMAL HIGH (ref 11.4–15.2)

## 2023-06-17 MED ORDER — DEXTROSE 5 % IV SOLN
Freq: Once | INTRAVENOUS | Status: AC
  Filled 2023-06-17: qty 250

## 2023-06-17 MED ORDER — HEPARIN SOD (PORK) LOCK FLUSH 100 UNIT/ML IV SOLN
500.0000 [IU] | Freq: Once | INTRAVENOUS | Status: AC | PRN
  Administered 2023-06-17: 500 [IU]
  Filled 2023-06-17: qty 5

## 2023-06-17 MED ORDER — SODIUM CHLORIDE 0.9% FLUSH
10.0000 mL | INTRAVENOUS | Status: DC | PRN
  Administered 2023-06-17: 10 mL
  Filled 2023-06-17: qty 10

## 2023-06-17 NOTE — Assessment & Plan Note (Signed)
And cardiomyopathy, follow up with cardiology Rate controlled. S/p ablation on 03/26/23  

## 2023-06-17 NOTE — Progress Notes (Signed)
No chemo treatment today per Dr. Cathie Hoops. Port deaccessed. Patient transported to medical mall for xray. Discharged from cancer center. Aware of appointments/ MD plan

## 2023-06-17 NOTE — Assessment & Plan Note (Addendum)
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 [facility] 2nd opinion was reviewed.  Recommend to proceed with 3rd line treatment with FOLFOX  Labs are reviewed and discussed with patient. Hold off FOLFOX due to supratheraputic INR and increased bleeding risk.  Re-evaluate in 1 week.

## 2023-06-17 NOTE — Assessment & Plan Note (Addendum)
S/p palliative  radiation  Consider bisphosphonate after dental clearance is obtained. S/p left ischium.  radiation due to concern of pathological fracture.  Right thigh pain, no RT per Radonc. Felt was due to arthritis.  Patient will follow up with Orthopedic Surgeon for steroid injection.   S/p fall yesterday, will obtain right femur and right hip to rule out fracture.

## 2023-06-17 NOTE — Telephone Encounter (Signed)
Called and Spoke to [employer] this morning to inform her of pt's critical INR 5.6. She stated she would contact pt this afternoon regarding coumadin.

## 2023-06-17 NOTE — Assessment & Plan Note (Signed)
Refer to physical therapy

## 2023-06-17 NOTE — Assessment & Plan Note (Signed)
 Follow with nutritionist.

## 2023-06-17 NOTE — Telephone Encounter (Signed)
Received call from Merleen Nicely, RN, with [facility] oncology infusion center. She reports pt received an infusion today and they also tested INR since pt was restarted on warfarin. Pt had OV with PCP on 8/6, and per oncology request, pt was restarted on warfarin, 5 mg daily.This nurse was not aware this pt was restarted on warfarin and was to f/u with the coumadin clinic. INR checked today at oncology apt was 5.6, per Lanora Manis. She reports [facility] coumadin clinic is to manage warfarin. Per PCP note on 8/6, pt was restarted on warfarin and was to have INR checked in 3 weeks.  Dr. Cathie Hoops, oncology provider, reported in OV note today that pt was to continue lovenox injections. Per PCP, on 8/6, pt was to continue lovenox injections for 3 days while starting warfarin, and then stop injections.  Contacted pt and he could not talk at the time because he was at a radiology apt for an x-ray. He requested to be called back. Advised this nurse will return call this afternoon. Advised do NOT take any warfarin until contacted by coumadin clinic. Pt verbalized understanding.   Pt is receiving several infusion with oncology treatment. Most do not interact but fluorouracil has a major interaction.

## 2023-06-17 NOTE — Assessment & Plan Note (Addendum)
#  Recurrent lower extremity DVT, secondary to antiphospholipid syndrome.-. Need  life time anticoagulation Switched to coumadin with INR goal of 2-3.   He has been on Coumadin 5mg  BID until 1 day ago, yesterday's dose was held.  INR supertheraputic. INR result was sent to PCP's office for coumadin dosing.

## 2023-06-17 NOTE — Telephone Encounter (Signed)
Contacted [facility] and spoke with Gabriel Rung, pharmacist, who reports insurance will not cover it until 8/30. He said pt can pay out of pocket for 15 tablets and it would be $13.99.  He also reported he does not need a new script, that the pt can ask for 15 day supply.  Contacted pt and advised. Pt verbalized understanding and was appreciative of the help.

## 2023-06-17 NOTE — Progress Notes (Signed)
Pt here for follow up. Reports that he had a fall over the weekend and had to take pain medication to help with the pain.

## 2023-06-17 NOTE — Telephone Encounter (Signed)
Contacted pt who reports he has been taking 2 tablets (10 mg) warfarin daily since prescribed by PCP on 8/6. He said he was confused and messed up the dosing. He knows now he is to take only 1 tablet (5 mg) daily. Pt stopped taking lovenox injections as instructed by PCP.   Pt reports next oncology apt is Monday, 8/26. Advised he could have INR checked at that apt and the nurse will forward the result.   Advised pt to hold dose today and hold dose tomorrow and then start 1 tablet (5 mg) daily until recheck on 8/26. Advised to hold dose on 8/26 until INR is checked and this nurse contacts him with dosing instructions. Pt denies any s/s of abnormal bruising or bleeding. Advised if any s/s to go to the ER. Pt verbalized understanding and read back dosing instructions. Gave pt direct number to coumadin clinic.

## 2023-06-17 NOTE — Progress Notes (Signed)
Hematology/Oncology Progress note [phone] [phone]    CHIEF COMPLAINTS/REASON FOR VISIT:  Follow up for right lower extremity DVT, antiphospholipid syndrome, metastatic cholangiocarcinoma.  ASSESSMENT & PLAN:   Cancer Staging  Cholangiocarcinoma [facility]) Staging form: Intrahepatic Bile Duct, AJCC 8th Edition - Clinical stage from 08/27/2022: Stage IV (cT2, cN1, cM1) - Signed by [name], MD on 09/13/2022   Cholangiocarcinoma (HCC) Cholangiocarcinoma, NGS-[facility]us liquid biopsy FGFR2 -ADK mutation, TMB 0,  S/p Gemcitabine, Cisplatin and Durvalumab x 6 , 01/2023 CT progression/enlarged liver lesions --03/03/23 2nd line Futibatinib- 04/26/23 CT showed interval progression  Recommend patient to stop Futibatinib. [facility] 2nd opinion was reviewed.  Recommend to proceed with 3rd line treatment with FOLFOX  Labs are reviewed and discussed with patient. Hold off FOLFOX due to supratheraputic INR and increased bleeding risk.  Re-evaluate in 1 week.   Hypocalcemia Continue calcium supplementation, currently on 1100 mg twice daily.   Recommend Vitamin D 1000 units daily.   Antiphospholipid syndrome (HCC) #Recurrent lower extremity DVT, secondary to antiphospholipid syndrome.-. Need  life time anticoagulation Switched to coumadin with INR goal of 2-3.   He has been on Coumadin 5mg  BID until 1 day ago, yesterday's dose was held.  INR supertheraputic. INR result was sent to PCP's office for coumadin dosing.    Metastasis to bone [facility]) S/p palliative  radiation  Consider bisphosphonate after dental clearance is obtained. S/p left ischium.  radiation due to concern of pathological fracture.  Right thigh pain, no RT per Radonc. Felt was due to arthritis.  Patient will follow up with Orthopedic Surgeon for steroid injection.   S/p fall yesterday, will obtain right femur and right hip to rule out fracture.    Atrial fibrillation (HCC) And cardiomyopathy, follow up  with cardiology Rate controlled. S/p ablation on 03/26/23   Weight loss Follow with nutritionist.   Frequent falls Refer to physical therapy  Orders Placed This Encounter  Procedures   DG HIP UNILAT W OR W/O PELVIS 2-3 VIEWS RIGHT    Standing Status:   Future    Number of Occurrences:   1    Standing Expiration Date:   06/16/2024    Order Specific Question:   Reason for Exam (SYMPTOM  OR DIAGNOSIS REQUIRED)    Answer:   right hip pain    Order Specific Question:   Preferred imaging location?    Answer:   [facility]   DG FEMUR, MIN 2 VIEWS RIGHT    Standing Status:   Future    Number of Occurrences:   1    Standing Expiration Date:   06/16/2024    Order Specific Question:   Reason for Exam (SYMPTOM  OR DIAGNOSIS REQUIRED)    Answer:   right femur pain    Order Specific Question:   Preferred imaging location?    Answer:   [facility]   Protime-INR    Standing Status:   Future    Number of Occurrences:   1    Standing Expiration Date:   06/16/2024   Protime-INR    Standing Status:   Future    Standing Expiration Date:   07/07/2024   Cancer antigen 19-9    Standing Status:   Future    Standing Expiration Date:   06/23/2024   CEA    Standing Status:   Future    Standing Expiration Date:   06/23/2024   CBC with Differential (Cancer Center Only)    Standing Status:   Future  Standing Expiration Date:   06/23/2024   CMP (Cancer Center only)    Standing Status:   Future    Standing Expiration Date:   06/23/2024   Ambulatory referral to Physical Therapy    Referral Priority:   Routine    Referral Type:   Physical Medicine    Referral Reason:   Specialty Services Required    Requested Specialty:   Physical Therapy    Number of Visits Requested:   1   Follow up 1 week  All questions were answered. The patient knows to call the clinic with any problems, questions or concerns.  [name], MD, PhD [facility] Hematology Oncology 06/17/2023    HISTORY OF PRESENTING  ILLNESS:  Patient reports remote history of left lower extremity DVT in 2002.  He was initiated on Lovenox and bridged to Coumadin.  Patient took warfarin for 2 years before anticoagulation was stopped. 12/03/2021, patient presented to emergency room for evaluation of right lower extremity pain and swelling for about a week.  Started on the inner side of right thigh and migrated to the right calf. + Associated with swelling.  Denies any recent injury, hospitalization, surgery.  He first noticed the symptoms after playing basketball with his grandson.   12/03/2021, right lower extremity ultrasound showed occlusive DVT extending from the mid aspect of the femoral vein through the imaged tibial vein.  Age-indeterminate. Patient was started on Xarelto. He was referred to establish care with vascular surgeon and was seen by Sheppard Plumber on 12/06/2021.  Shared decision was made not to proceed with embolectomy.  Continue anticoagulation. Patient was referred to hematology oncology for further evaluation.  Patient denies any family history of blood clots.  Denies any unintentional weight loss, fever or night sweats. 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  He sometimes stay in his car while waiting for next assessment appointment.  He reports the right lower extremity symptom has improved since the start of Xarelto.  No active bleeding events.  #12/18/2021 hypercoagulable work-up showed JAK2 V617F mutation negative, with reflex to other mutations CALR, MPL, JAK 2 Ex 12-15 mutations negative, negative anticardiolipin IgG and IgM antibodies, positive lupus anticoagulant, negative factor V Leiden mutation, negative prothrombin gene mutation, normal protein C activity, normal protein S antigen level. Patient was recommended to switch to Coumadin with INR goal of 2-3.  #03/15/2022, repeat lupus anticoagulant is persistently  positive- + antiphospholipid syndrome.  Patient is currently on Lovenox 1 mg/kg twice daily.  admitted due to multifocal pneumonia, treated with Influenza panel negative, COVID-negative, HIV negative, Complete course of antibiotics with Zithromax, Vantin   + HCV antibody positive, HCV RNA quantification not detected. HCV RT PCR not detected.   INTERVAL HISTORY Christopher Marsh is a 62 y.o. male who has above history reviewed by me today presents for follow up visit for management of right lower extremity DVT and antiphospholipid syndrome, metastatic cholangiocarcinoma.  Oncology History  Cholangiocarcinoma (HCC)  10/30/2021 Imaging   CT chest wo contrast 1. Heterogeneous bilateral ground-glass disease, multilobar but worst in the right middle lobe, lingula and lower lobes. There is progression of the abnormality since 10/12/2022. Though pneumonia could produce this appearance, given persistence and fluctuating appearance since October, consider alternative entities such as hypersensitivity pneumonitis or idiopathic interstitial pneumonia. 2. Slightly enlarged pulmonary trunk, possible arterial hypertension 3. Redemonstrated large hypodense right hepatic lobe mass.   08/18/2022 Imaging  CT chest w contrast showed 1. Multifocal airspace disease in both lungs, left side greater than right. Findings are most compatible with multifocal pneumonia. 2. Multiple new hepatic lesions with enlarging lymph nodes in the upper abdomen and chest. Findings are concerning for metastatic disease. Based on the multifocal pneumonia, hepatic abscesses would also be in the differential diagnosis. Recommend further characterization of the abdomen and pelvis with CT with IV contrast. 3. Enlargement of the main pulmonary artery could be associated with pulmonary hypertension. 4. Coronary artery calcifications. 5. Multinodular goiter. Patient has known thyroid nodules and previous thyroid ultrasound.    08/18/2022  Imaging   CT abdomen pelvis w contrast  Interval development of 6.5 x 5.2 cm heterogeneously enhancing mass in right hepatic lobe. This is highly concerning for neoplasm or malignancy, and further evaluation with MRI is recommended.   Mildly enlarged periaortic adenopathy is noted, with the largest lymph node measuring 12 mm. Metastatic disease cannot be excluded. Mildly enlarged adenopathy is also noted in the gastrohepatic ligament, but this is unchanged compared to prior exam.   Stable bibasilar lung opacities are noted concerning for inflammation, atelectasis or possibly scarring.   Grossly stable multi-septated cystic lesion is seen involving upper pole of right kidney with peripheral calcifications compared to prior exam of 2019, most likely representing benign etiology.   Aortic Atherosclerosis    08/18/2022 - 08/21/2022 Hospital Admission   Admitted due to multifocal pneumonia, treated with Influenza panel negative, COVID-negative, HIV negative, Complete course of antibiotics with Zithromax, Vantin     08/19/2022 Imaging   MR abdomen w wo contrast  Marked caudate lobe hypertrophy, highly suspicious for hepatic cirrhosis.   Numerous hypervascular masses throughout the right hepatic lobe, highly suspicious for multifocal hepatocellular carcinoma. Recommend correlation with AFP and consider tissue sampling.   Mild abdominal lymphadenopathy, with differential diagnosis including metastatic disease and reactive lymphadenopathy in setting of cirrhosis.   Bilateral lower lobe infiltrates, as better demonstrated on recent CT.    08/19/2022 Tumor Marker   AFP 4.1   08/27/2022 Initial Diagnosis   Cholangiocarcinoma  -08/20/22 Liver mass biopsy showed poorly differentiated carcinoma Immunohistochemical stains show that the poorly differentiated carcinoma is positive for CK7 and MOC-31, suggestive of a poorly differentiated adenocarcinoma.  Immunohistochemical stains for CK20, CDX2,  HepPar 1 and arginase are negative.  Immunostain for glypican-3 shows very focal  likely nonspecific labeling.  This immunoprofile is nonspecific but can be compatible with a poorly differentiated primary cholangiocarcinoma in absence of any other lesions.   NGS showed BAP 1 mutation.  Case was presented at tumor board, I have also discussed with pathologist Dr.Rubinas,  IHC pattern is more consistent with adenocarcinoma, unlikely HCC [due to negative HepPar 1 and arginase], cholangiocarcinoma is favored, although this is a diagnosis of exclusion. Second opinion ar [facility]   08/27/2022 Cancer Staging   Staging form: Intrahepatic Bile Duct, AJCC 8th Edition - Clinical stage from 08/27/2022: Stage IV (cT2, cN1, cM1) - Signed by [name], MD on 09/13/2022 Stage prefix: Initial diagnosis   08/27/2022 Tumor Marker   CA 19.9  16 CEA 2.8   09/12/2022 Imaging   PET scan showed 1. Large right hepatic lobe mass is hypermetabolic and consistent with known cholangiocarcinoma. 2. Scattered borderline enlarged upper abdominal lymph nodes do not show any significant FDG uptake. 3. 4 hypermetabolic bone lesions consistent with metastatic disease. 4. Progressive diffuse airspace process in the left lung could be due to atypical pneumonia, pulmonary hemorrhage or left-sided aspiration. 5. Aortic  atherosclerosis.  09/12/2022 I had a phone discussion with patient after his PET scan resulted.  Recommend systemic chemotherapy.  Patient would like to defer until his second opinion visit at [facility].   09/15/2022 Imaging   CT angio chest pulmonary embolism protocol showed 1. No evidence for pulmonary embolism. 2. Progression of bilateral multifocal patchy ground-glass and interstitial opacities, left greater than right. Findings are concerning for multifocal pneumonia. 3. Air-fluid levels in the left lower lobe bronchi worrisome for aspiration. 4. Mediastinal and hilar lymphadenopathy has increased in size and number  when compared to the prior study, likely reactive.5. Cardiomegaly. 6. Findings compatible with pulmonary artery hypertension.7. Stable right liver mass and complex right renal cystic lesion. 8. Stable enlargement of the left thyroid.   09/15/2022 - 09/17/2022 Hospital Admission   Patient was admitted due to shortness of breath, CT showed progression of bilateral multifocal patchy groundglass and interstitial opacities.  Left greater than right.  Concerning for multifocal pneumonia.  Patient was started on broad-spectrum antibiotics with significant improvement.  He was discharged on oral antibiotics.   09/19/2022 Miscellaneous   Patient went to [facility] for second opinion.  He was seen by Shirlee Latch, who felt that the presentation is consistent with metastatic cholangiocarcinoma, and agrees with the plan of cisplatin/gemcitabine/durvalumab..    10/11/2022 - 01/31/2023 Chemotherapy   Patient is on Treatment Plan : Cisplatin D1,8 + Gemcitabine + Durvalumab  D1,8 q21d x     10/12/2022 Imaging   CT chest w contrast 1. Bilateral airspace and ground-glass opacities have mildly decreased in amount and density when compared to the prior study. Findings are compatible with resolving multifocal pneumonia. Continued follow-up imaging recommended. 2. Stable enlarged heterogeneous left thyroid gland with 3.2 cm nodule. This can be further evaluated with dedicated ultrasound. 3. Stable hepatic and right renal lesions, incompletely evaluated.   Aortic Atherosclerosis (ICD10-I70.0).   12/11/2022 Imaging   PET showed  1. There is been mild increase in size of the tracer avid mass within right lobe of liver compatible with known cholangiocarcinoma. The degree of tracer uptake however is slightly decreased in the interval. 2. Multifocal tracer avid bone metastases are again noted. When compared with the previous exam there has been an mixed interval response to therapy. Specifically, there has been interval  increase in size and FDG uptake associated with the right ischial tuberosity lesion and there is a new lesion within the posterior left iliac bone. Decreased tracer uptake is associated with the left scapular lesion and there is stable tracer uptake associated with the right T1 transverse process lesion. 3. Persistent ground-glass and airspace densities within the left upper lobe and left lower lobe compatible with inflammatory or infectious process. Interval resolution of previous right lung ground-glass and airspace densities.    12/24/2022 Procedure   Medi port placement by Dr. Wyn Quaker   01/22/2023 Imaging   CT chest abdomen pelvis with contrast 1. Hypodense lesion of the right lobe of the liver, hepatic segment VII/VIII is slightly diminished in size, consistent with treatment response. 2. No evident change in additional smaller hypodense lesions scattered throughout the right lobe of the liver, these more difficult to see on prior noncontrast PET-CT. 3. Unchanged enlarged celiac axis, gastrohepatic ligament, and retroperitoneal lymph nodes. Unchanged prominent high right paratracheal lymph nodes. 4. Unchanged osseous metastases. 5. Slightly improved, although still extensive ground-glass airspace opacity primarily seen throughout the left lower lobe and lingula,nonspecific and infectious or inflammatory. 6. Coronary artery disease. 7. Emphysema.   02/11/2023 Imaging  Patient self decreased Lovenox dosage to 120mg  once daily.  Increased lower extremity swelling.   02/11/23 [place] bilateral lower extremity showed 1. Nonocclusive DVT within the popliteal veins bilaterally. 2. Occlusive DVT within the posterior tibial veins of the right calf.       02/13/2023 Imaging   CT chest Angiogram  1. Technically adequate exam showing no acute pulmonary embolus. 2. Cardiomegaly. Coronary artery calcifications. 3. Bilateral pleural effusions. 4. Improved aeration of the RIGHT UPPER and LOWER lobes  compared to prior study. 5. LEFT LOWER lobe and lingula airspace filling and atelectasis, slightly improved compared to prior study. 6. Cirrhotic morphology of the liver. 7.7 centimeters mass in the RIGHT hepatic lobe. 7. Gastrohepatic ligament adenopathy. 8. A 7 millimeters sclerotic lesion in the UPPER sternum is suspicious for metastatic disease. 9. LEFT thyroid goiter.   02/13/2023 - 02/15/2023 Hospital Admission   A flutter with variable A-V block. Heart rate up to 140.  BNP 119.8 04/18: Echo no overt CHF - EF 50-55  Cardiology recs beta blocker to metoprolol succinate 50 mg bid, Torsemid 40mg  daily Adjusting Lovenox 14mg  BID.    03/03/2023 - 05/03/2023 Chemotherapy   Futibatinib 20mg , 03/12/23 Decreased to 16mg  daily Stopped in early July due to cancer progression.   04/24/2023 - 04/30/2023 Radiation Therapy   left ischium radiation.    05/20/2023 -  Chemotherapy   Patient is on Treatment Plan : COLORECTAL FOLFOX q14d      INTERVAL HISTORY Christopher Marsh is a 62 y.o. male who has above history reviewed by me today presents for follow up visit for Cholangiocarcinoma, antiphospholipid syndrome, thrombosis + Fatigue, Appetite is good. Lost weight  He has started on Coumadin.  He misunderstood the Coumadin dosage instruction and has been taking 5 mg twice daily until yesterday he was advised to skip a dose. S/p fall yesterday, worse of right thigh/hip pain.   Review of Systems  Constitutional:  Positive for fatigue. Negative for appetite change, chills and fever.  HENT:   Negative for hearing loss and voice change.   Eyes:  Negative for eye problems.  Respiratory:  Negative for chest tightness, cough and shortness of breath.   Cardiovascular:  Positive for leg swelling. Negative for chest pain.  Gastrointestinal:  Negative for abdominal distention, abdominal pain, blood in stool and diarrhea.  Endocrine: Negative for hot flashes.  Genitourinary:  Negative for difficulty urinating and  frequency.   Musculoskeletal:  Negative for arthralgias.  Skin:  Negative for itching.       Left butt blister is better.   Neurological:  Negative for extremity weakness.  Hematological:  Negative for adenopathy. Bruises/bleeds easily.  Psychiatric/Behavioral:  Negative for confusion.     MEDICAL HISTORY:  Past Medical History:  Diagnosis Date   Antiphospholipid antibody syndrome (HCC)    Arthritis    Cancer (HCC)    Diabetes mellitus without complication (HCC)    DVT (deep venous thrombosis) (HCC)    Lt leg   Dyspnea    Elevated lipids    Erythrocytosis    Hyperlipidemia    Hypertension    Lower extremity edema    Multinodular thyroid    Post-thrombotic syndrome     SURGICAL HISTORY: Past Surgical History:  Procedure Laterality Date   CARDIOVERSION N/A 03/22/2023   Procedure: CARDIOVERSION;  Surgeon: Antonieta Iba, MD;  Location: [facility] ORS;  Service: Cardiovascular;  Laterality: N/A;   COLONOSCOPY WITH PROPOFOL N/A 09/23/2018   Per Dr. Norma Fredrickson, polyp, repeat in 3 yrs  EYE MUSCLE SURGERY     JOINT REPLACEMENT Left 07/22/2017   PORTA CATH INSERTION N/A 12/24/2022   Procedure: PORTA CATH INSERTION;  Surgeon: Annice Needy, MD;  Location: [facility] INVASIVE CV LAB;  Service: Cardiovascular;  Laterality: N/A;   TOTAL HIP ARTHROPLASTY Left 07/22/2017   Procedure: TOTAL HIP ARTHROPLASTY;  Surgeon: Donato Heinz, MD;  Location: [facility] ORS;  Service: Orthopedics;  Laterality: Left;    SOCIAL HISTORY: Social History   Socioeconomic History   Marital status: Married    Spouse name: Not on file   Number of children: Not on file   Years of education: Not on file   Highest education level: Not on file  Occupational History   Not on file  Tobacco Use   Smoking status: Former    Current packs/day: 0.00    Average packs/day: 0.5 packs/day for 6.0 years (3.0 ttl pk-yrs)    Types: Cigarettes    Start date: 35    Quit date: 54    Years since quitting: 42.6   Smokeless  tobacco: Never   Tobacco comments:    Pt states when he did smoke he smoked at most 1 ppd. ALS 09/26/2022  Vaping Use   Vaping status: Never Used  Substance and Sexual Activity   Alcohol use: Not Currently    Comment: occ   Drug use: No   Sexual activity: Not on file  Other Topics Concern   Not on file  Social History Narrative   Not on file   Social Determinants of Health   Financial Resource Strain: Low Risk  (08/27/2022)   Overall Financial Resource Strain (CARDIA)    Difficulty of Paying Living Expenses: Not very hard  Food Insecurity: No Food Insecurity (02/18/2023)   Hunger Vital Sign    Worried About Running Out of Food in the Last Year: Never true    Ran Out of Food in the Last Year: Never true  Transportation Needs: No Transportation Needs (02/18/2023)   PRAPARE - Administrator, Civil Service (Medical): No    Lack of Transportation (Non-Medical): No  Physical Activity: Insufficiently Active (08/27/2022)   Exercise Vital Sign    Days of Exercise per Week: 3 days    Minutes of Exercise per Session: 30 min  Stress: Stress Concern Present (08/27/2022)   [employer] of Occupational Health - Occupational Stress Questionnaire    Feeling of Stress : Rather much  Social Connections: Socially Integrated (08/27/2022)   Social Connection and Isolation Panel [NHANES]    Frequency of Communication with Friends and Family: More than three times a week    Frequency of Social Gatherings with Friends and Family: More than three times a week    Attends Religious Services: 1 to 4 times per year    Active Member of [employer] or Organizations: Yes    Attends Banker Meetings: Never    Marital Status: Married  Catering manager Violence: Not At Risk (02/14/2023)   Humiliation, Afraid, Rape, and Kick questionnaire    Fear of Current or Ex-Partner: No    Emotionally Abused: No    Physically Abused: No    Sexually Abused: No    FAMILY HISTORY: Family History   Problem Relation Age of Onset   Breast cancer Mother    Emphysema Father    Colon cancer Maternal Grandmother     ALLERGIES:  has No Known Allergies.  MEDICATIONS:  Current Outpatient Medications  Medication Sig Dispense Refill   albuterol (  VENTOLIN HFA) 108 (90 Base) MCG/ACT inhaler TAKE 2 PUFFS BY MOUTH EVERY 4 HOURS AS NEEDED 8.5 each 1   Calcium Carbonate 500 MG CHEW Chew 2 tablets (1,000 mg total) by mouth daily. 60 tablet 0   diphenoxylate-atropine (LOMOTIL) 2.5-0.025 MG tablet Take 1 tablet by mouth 4 (four) times daily as needed for diarrhea or loose stools. 120 tablet 3   enoxaparin (LOVENOX) 120 MG/0.8ML injection Inject 0.8 mLs (120 mg total) into the skin every 12 (twelve) hours. 48 mL 2   fentaNYL (DURAGESIC) 50 MCG/HR Place 1 patch onto the skin every 3 (three) days. 5 patch 0   glipiZIDE (GLUCOTROL) 10 MG tablet Take 1 tablet (10 mg total) by mouth 2 (two) times daily before a meal. 180 tablet 3   glucose blood test strip 1 each by Other route as needed for other. accu chek aviva plusUse as instructed     ketoconazole (NIZORAL) 2 % cream Apply 1 Application topically 2 (two) times daily as needed for irritation. 60 g 5   Lactulose 20 GM/30ML SOLN Take 30 mLs (20 g total) by mouth daily as needed (constipation.). 450 mL 0   lidocaine-prilocaine (EMLA) cream Apply 1 Application topically as needed. 30 g 5   LORazepam (ATIVAN) 0.5 MG tablet TAKE 1 TABLET (0.5 MG TOTAL) BY MOUTH EVERY 8 (EIGHT) HOURS AS NEEDED FOR ANXIETY (NAUSEA VOMITING). 60 tablet 0   magnesium chloride (SLOW-MAG) 64 MG TBEC SR tablet Take 1 tablet (64 mg total) by mouth 2 (two) times daily. 60 tablet 0   metFORMIN (GLUCOPHAGE) 500 MG tablet TAKE TWO TABLETS BY MOUTH EVERY MORNING WITH BREAKFAST 180 tablet 1   metoprolol tartrate (LOPRESSOR) 50 MG tablet Take 1 tablet (50 mg total) by mouth 2 (two) times daily. 180 tablet 3   MUCINEX D MAX STRENGTH 424 [id number] 1270 MG TB12 Take 1,200 mg by mouth 2 (two) times daily  as needed (for congestion or to loosen mucous in the chest).     ondansetron (ZOFRAN) 4 MG tablet Take by mouth.     oxyCODONE (OXY IR/ROXICODONE) 5 MG immediate release tablet Take 1 tablet (5 mg total) by mouth every 4 (four) hours as needed for severe pain. 60 tablet 0   potassium chloride (KLOR-CON) 10 MEQ tablet Take 1 tablet (10 mEq total) by mouth daily. 90 tablet 3   pravastatin (PRAVACHOL) 20 MG tablet Take 1 tablet (20 mg total) by mouth daily. 90 tablet 3   silver sulfADIAZINE (SILVADENE) 1 % cream Apply 1 Application topically daily. 50 g 0   tadalafil (CIALIS) 20 MG tablet Take 0.5-1 tablets (10-20 mg total) by mouth every other day as needed for erectile dysfunction. 20 tablet 11   torsemide (DEMADEX) 20 MG tablet Take 1 tablet (20 mg total) by mouth 2 (two) times daily as needed. 180 tablet 3   traZODone (DESYREL) 50 MG tablet TAKE 1 TABLET BY MOUTH AT BEDTIME AS NEEDED FOR SLEEP. 30 tablet 1   triamcinolone cream (KENALOG) 0.1 % Apply 1 Application topically 3 (three) times daily. 80 g 5   warfarin (COUMADIN) 5 MG tablet Take 1 tablet (5 mg total) by mouth daily. 30 tablet 5   No current facility-administered medications for this visit.     PHYSICAL EXAMINATION: ECOG PERFORMANCE STATUS: 1 - Symptomatic but completely ambulatory Vitals:   06/17/23 0856  BP: (!) 145/89  Pulse: 63  Resp: 18  Temp: (!) 96.7 F (35.9 C)   Filed Weights   06/17/23 0856  Weight:  249 lb 6.4 oz (113.1 kg)    Physical Exam Constitutional:      General: He is not in acute distress. HENT:     Head: Normocephalic and atraumatic.  Eyes:     General: No scleral icterus. Cardiovascular:     Rate and Rhythm: Normal rate.  Pulmonary:     Effort: Pulmonary effort is normal. No respiratory distress.     Breath sounds: No wheezing.  Abdominal:     General: Bowel sounds are normal. There is no distension.  Musculoskeletal:        General: Normal range of motion.     Cervical back: Normal  range of motion and neck supple.     Right lower leg: Edema present.     Left lower leg: Edema present.  Skin:    General: Skin is warm and dry.     Findings: No erythema.  Neurological:     Mental Status: He is alert and oriented to person, place, and time. Mental status is at baseline.     Cranial Nerves: No cranial nerve deficit.  Psychiatric:        Mood and Affect: Mood normal.     LABORATORY DATA:  I have reviewed the data as listed    Latest Ref Rng & Units 06/17/2023    8:43 AM 06/03/2023    8:03 AM 05/27/2023    9:37 AM  CBC  WBC 4.0 - 10.5 K/uL 4.2  3.1  3.0   Hemoglobin 13.0 - 17.0 g/dL 70.6  23.7  62.8   Hematocrit 39.0 - 52.0 % 34.8  33.1  33.4   Platelets 150 - 400 K/uL 116  131  96       Latest Ref Rng & Units 06/17/2023    8:43 AM 06/03/2023    8:03 AM 05/27/2023    9:23 AM  CMP  Glucose 70 - 99 mg/dL 57  98  315   BUN 8 - 23 mg/dL 9  5  8    Creatinine 0.61 - 1.24 mg/dL 1.76  1.60  7.37   Sodium 135 - 145 mmol/L 131  134  140   Potassium 3.5 - 5.1 mmol/L 3.9  3.5  3.7   Chloride 98 - 111 mmol/L 100  102  106   CO2 22 - 32 mmol/L 24  26  26    Calcium 8.9 - 10.3 mg/dL 8.6  8.8  8.5   Total Protein 6.5 - 8.1 g/dL 6.1  5.8  6.1   Total Bilirubin 0.3 - 1.2 mg/dL 0.9  0.8  0.5   Alkaline Phos 38 - 126 U/L 94  70  77   AST 15 - 41 U/L 38  26  25   ALT 0 - 44 U/L 19  14  16      RADIOGRAPHIC STUDIES: I have personally reviewed the radiological images as listed and agreed with the findings in the report. No results found.

## 2023-06-17 NOTE — Assessment & Plan Note (Signed)
Continue calcium supplementation, currently on 1100 mg twice daily.   Recommend Vitamin D 1000 units daily.

## 2023-06-17 NOTE — Progress Notes (Signed)
CRITICAL VALUE STICKER  CRITICAL VALUE: INR 5.55  DATE & TIME NOTIFIED: 06/17/23 @ 1055  MESSENGER (representative from lab): Kerry Hough  MD NOTIFIED: Dr. Cathie Hoops  TIME OF NOTIFICATION: 1058  RESPONSE:  Awaiting, see new orders

## 2023-06-17 NOTE — Patient Instructions (Addendum)
Pre visit review using our clinic review tool, if applicable. No additional management support is needed unless otherwise documented below in the visit note.  Hold warfarin today and hold warfarin tomorrow and then take 1 tablet daily until recheck on 8/26.

## 2023-06-17 NOTE — Progress Notes (Signed)
Received call from Merleen Nicely, RN, with [facility] oncology infusion center. She reports pt received an infusion today and they also tested INR since pt was restarted on warfarin. Pt had OV with PCP on 8/6, and per oncology request, pt was restarted on warfarin, 5 mg daily.This nurse was not aware this pt was restarted on warfarin and was to f/u with the coumadin clinic. INR checked today at oncology apt was 5.6, per Lanora Manis.  Pt currently under treatment for cholangiocarcinoma and is taking Folfox by infusion.  Pt reported he miss understood the dosing instructions for warfarin and has been taking 2 tablets (10 mg) daily since prescribed by PCP on 8/6. Pt has stopped lovenox injections. Pt denies any s/s of abnormal bruising or bleeding.  Contacted pt by phone and advised to hold warfarin today and hold warfarin tomorrow and then take 1 tablet daily until recheck on 8/26. Pt reports he tried to get a refill of warfarin but they told him it was too early to refill. This is when he realized he was taking it incorrectly. He requested this nurse contact pharmacy and advised them he needs a refill and to send in a 90 day supply instead of a 30 days supply. Advised this nurse will contact the pharmacy. Advised if any changes to contact the coumadin clinic. Advised if any s/s of abnormal bruising or bleeding to go to the ER. Pt verbalized understanding.  Sent msg to Lanora Manis, RN at oncology, to recheck INR on 8/26 and send result to this nurse.

## 2023-06-18 LAB — CANCER ANTIGEN 19-9: CA 19-9: 55 U/mL — ABNORMAL HIGH (ref 0–35)

## 2023-06-18 LAB — CEA: CEA: 8.8 ng/mL — ABNORMAL HIGH (ref 0.0–4.7)

## 2023-06-19 ENCOUNTER — Encounter: Payer: Self-pay | Admitting: Oncology

## 2023-06-19 ENCOUNTER — Encounter: Payer: Self-pay | Admitting: Family Medicine

## 2023-06-19 ENCOUNTER — Inpatient Hospital Stay: Payer: BC Managed Care – PPO

## 2023-06-19 NOTE — Telephone Encounter (Signed)
Received a call from [facility] with [employer] stating that pt has met requirements and should now be able to get Oxycodone filled. Per Orland Mustard Appeal will be withdrawn for future use if needed.   Ph: [phone]

## 2023-06-20 DIAGNOSIS — C221 Intrahepatic bile duct carcinoma: Secondary | ICD-10-CM | POA: Diagnosis not present

## 2023-06-20 NOTE — Telephone Encounter (Signed)
Spoke with pt stated that he just got off Lovenox injections. Pt states that he noticed his labs done on 06/17/23 [facility]) his glucose was 57. Pt states that he has not started monitoring his blood sugars at home but plans on starting soon. He is taking Glipizide 10 mg and Metformin 500 mg BID. Pt wants to now if he should hold any of the med's to avoid his blood glucose getting too low.

## 2023-06-20 NOTE — Telephone Encounter (Signed)
Reviewed Kandee Keen advise with pt verbalized understanding

## 2023-06-21 ENCOUNTER — Ambulatory Visit: Payer: BC Managed Care – PPO

## 2023-06-21 ENCOUNTER — Other Ambulatory Visit: Payer: BC Managed Care – PPO

## 2023-06-21 ENCOUNTER — Ambulatory Visit: Payer: BC Managed Care – PPO | Admitting: Oncology

## 2023-06-21 MED FILL — Dexamethasone Sodium Phosphate Inj 100 MG/10ML: INTRAMUSCULAR | Qty: 1 | Status: AC

## 2023-06-23 ENCOUNTER — Other Ambulatory Visit: Payer: Self-pay | Admitting: Oncology

## 2023-06-24 ENCOUNTER — Inpatient Hospital Stay: Payer: BC Managed Care – PPO

## 2023-06-24 ENCOUNTER — Ambulatory Visit: Payer: Self-pay

## 2023-06-24 ENCOUNTER — Encounter: Payer: Self-pay | Admitting: Oncology

## 2023-06-24 ENCOUNTER — Inpatient Hospital Stay (HOSPITAL_BASED_OUTPATIENT_CLINIC_OR_DEPARTMENT_OTHER): Payer: BC Managed Care – PPO | Admitting: Oncology

## 2023-06-24 VITALS — BP 113/62 | HR 59 | Temp 96.7°F | Resp 18 | Ht 75.0 in | Wt 246.0 lb

## 2023-06-24 DIAGNOSIS — Z452 Encounter for adjustment and management of vascular access device: Secondary | ICD-10-CM | POA: Diagnosis not present

## 2023-06-24 DIAGNOSIS — D6861 Antiphospholipid syndrome: Secondary | ICD-10-CM | POA: Diagnosis not present

## 2023-06-24 DIAGNOSIS — C221 Intrahepatic bile duct carcinoma: Secondary | ICD-10-CM

## 2023-06-24 DIAGNOSIS — I480 Paroxysmal atrial fibrillation: Secondary | ICD-10-CM

## 2023-06-24 DIAGNOSIS — L598 Other specified disorders of the skin and subcutaneous tissue related to radiation: Secondary | ICD-10-CM | POA: Diagnosis not present

## 2023-06-24 DIAGNOSIS — R634 Abnormal weight loss: Secondary | ICD-10-CM

## 2023-06-24 DIAGNOSIS — Z5111 Encounter for antineoplastic chemotherapy: Secondary | ICD-10-CM | POA: Diagnosis not present

## 2023-06-24 DIAGNOSIS — Z7901 Long term (current) use of anticoagulants: Secondary | ICD-10-CM | POA: Diagnosis not present

## 2023-06-24 DIAGNOSIS — I4891 Unspecified atrial fibrillation: Secondary | ICD-10-CM | POA: Diagnosis not present

## 2023-06-24 DIAGNOSIS — R296 Repeated falls: Secondary | ICD-10-CM

## 2023-06-24 DIAGNOSIS — C7951 Secondary malignant neoplasm of bone: Secondary | ICD-10-CM

## 2023-06-24 DIAGNOSIS — Z86718 Personal history of other venous thrombosis and embolism: Secondary | ICD-10-CM | POA: Diagnosis not present

## 2023-06-24 LAB — CBC WITH DIFFERENTIAL (CANCER CENTER ONLY)
Abs Immature Granulocytes: 0.01 10*3/uL (ref 0.00–0.07)
Basophils Absolute: 0 10*3/uL (ref 0.0–0.1)
Basophils Relative: 1 %
Eosinophils Absolute: 0 10*3/uL (ref 0.0–0.5)
Eosinophils Relative: 0 %
HCT: 34.9 % — ABNORMAL LOW (ref 39.0–52.0)
Hemoglobin: 11 g/dL — ABNORMAL LOW (ref 13.0–17.0)
Immature Granulocytes: 0 %
Lymphocytes Relative: 10 %
Lymphs Abs: 0.3 10*3/uL — ABNORMAL LOW (ref 0.7–4.0)
MCH: 29.3 pg (ref 26.0–34.0)
MCHC: 31.5 g/dL (ref 30.0–36.0)
MCV: 92.8 fL (ref 80.0–100.0)
Monocytes Absolute: 0.7 10*3/uL (ref 0.1–1.0)
Monocytes Relative: 24 %
Neutro Abs: 1.7 10*3/uL (ref 1.7–7.7)
Neutrophils Relative %: 65 %
Platelet Count: 169 10*3/uL (ref 150–400)
RBC: 3.76 MIL/uL — ABNORMAL LOW (ref 4.22–5.81)
RDW: 17.2 % — ABNORMAL HIGH (ref 11.5–15.5)
WBC Count: 2.7 10*3/uL — ABNORMAL LOW (ref 4.0–10.5)
nRBC: 0 % (ref 0.0–0.2)

## 2023-06-24 LAB — CMP (CANCER CENTER ONLY)
ALT: 20 U/L (ref 0–44)
AST: 35 U/L (ref 15–41)
Albumin: 3.1 g/dL — ABNORMAL LOW (ref 3.5–5.0)
Alkaline Phosphatase: 111 U/L (ref 38–126)
Anion gap: 4 — ABNORMAL LOW (ref 5–15)
BUN: 9 mg/dL (ref 8–23)
CO2: 25 mmol/L (ref 22–32)
Calcium: 8.6 mg/dL — ABNORMAL LOW (ref 8.9–10.3)
Chloride: 103 mmol/L (ref 98–111)
Creatinine: 0.82 mg/dL (ref 0.61–1.24)
GFR, Estimated: >60 mL/min (ref >60)
Glucose, Bld: 88 mg/dL (ref 70–99)
Potassium: 3.9 mmol/L (ref 3.5–5.1)
Sodium: 132 mmol/L — ABNORMAL LOW (ref 135–145)
Total Bilirubin: 0.8 mg/dL (ref 0.3–1.2)
Total Protein: 5.9 g/dL — ABNORMAL LOW (ref 6.5–8.1)

## 2023-06-24 LAB — PROTIME-INR
INR: 3.4 — ABNORMAL HIGH (ref 0.8–1.2)
Prothrombin Time: 34.4 seconds — ABNORMAL HIGH (ref 11.4–15.2)

## 2023-06-24 MED ORDER — OXALIPLATIN CHEMO INJECTION 100 MG/20ML
85.0000 mg/m2 | Freq: Once | INTRAVENOUS | Status: AC
  Administered 2023-06-24: 200 mg via INTRAVENOUS
  Filled 2023-06-24: qty 40

## 2023-06-24 MED ORDER — SODIUM CHLORIDE 0.9 % IV SOLN
10.0000 mg | Freq: Once | INTRAVENOUS | Status: AC
  Administered 2023-06-24: 10 mg via INTRAVENOUS
  Filled 2023-06-24: qty 10

## 2023-06-24 MED ORDER — LEUCOVORIN CALCIUM INJECTION 350 MG
400.0000 mg/m2 | Freq: Once | INTRAVENOUS | Status: AC
  Administered 2023-06-24: 980 mg via INTRAVENOUS
  Filled 2023-06-24: qty 49

## 2023-06-24 MED ORDER — FENTANYL 50 MCG/HR TD PT72: 1.0000 | MEDICATED_PATCH | TRANSDERMAL | 0 refills | Status: DC

## 2023-06-24 MED ORDER — SODIUM CHLORIDE 0.9 % IV SOLN
2400.0000 mg/m2 | INTRAVENOUS | Status: DC
  Administered 2023-06-24: 5900 mg via INTRAVENOUS
  Filled 2023-06-24: qty 118

## 2023-06-24 MED ORDER — DEXTROSE 5 % IV SOLN
Freq: Once | INTRAVENOUS | Status: AC
  Filled 2023-06-24: qty 250

## 2023-06-24 MED ORDER — PALONOSETRON HCL INJECTION 0.25 MG/5ML
0.2500 mg | Freq: Once | INTRAVENOUS | Status: AC
  Administered 2023-06-24: 0.25 mg via INTRAVENOUS
  Filled 2023-06-24: qty 5

## 2023-06-24 MED ORDER — FLUOROURACIL CHEMO INJECTION 2.5 GM/50ML
400.0000 mg/m2 | Freq: Once | INTRAVENOUS | Status: AC
  Administered 2023-06-24: 1000 mg via INTRAVENOUS
  Filled 2023-06-24: qty 20

## 2023-06-24 NOTE — Telephone Encounter (Signed)
Pt had INR drawn today. Documentation in anticoagulation encounter.

## 2023-06-24 NOTE — Assessment & Plan Note (Addendum)
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 [facility] 2nd opinion was reviewed.  Recommend to proceed with 3rd line treatment with FOLFOX  Labs are reviewed and discussed with patient. Proceed with FOLFOX today.

## 2023-06-24 NOTE — Patient Instructions (Signed)
[facility] [place] [facility]  Discharge Instructions: Thank you for choosing [facility] to provide your oncology and hematology care.  If you have a lab appointment with the Cancer Center, please go directly to the Cancer Center and check in at the registration area.  Wear comfortable clothing and clothing appropriate for easy access to any Portacath or PICC line.   We strive to give you quality time with your provider. You may need to reschedule your appointment if you arrive late (15 or more minutes).  Arriving late affects you and other patients whose appointments are after yours.  Also, if you miss three or more appointments without notifying the office, you may be dismissed from the clinic at the provider's discretion.      For prescription refill requests, have your pharmacy contact our office and allow 72 hours for refills to be completed.    Today you received the following chemotherapy and/or immunotherapy agents- oxaliplatin, leucovorin, 5FU      To help prevent nausea and vomiting after your treatment, we encourage you to take your nausea medication as directed.  BELOW ARE SYMPTOMS THAT SHOULD BE REPORTED IMMEDIATELY: *FEVER GREATER THAN 100.4 F (38 C) OR HIGHER *CHILLS OR SWEATING *NAUSEA AND VOMITING THAT IS NOT CONTROLLED WITH YOUR NAUSEA MEDICATION *UNUSUAL SHORTNESS OF BREATH *UNUSUAL BRUISING OR BLEEDING *URINARY PROBLEMS (pain or burning when urinating, or frequent urination) *BOWEL PROBLEMS (unusual diarrhea, constipation, pain near the anus) TENDERNESS IN MOUTH AND THROAT WITH OR WITHOUT PRESENCE OF ULCERS (sore throat, sores in mouth, or a toothache) UNUSUAL RASH, SWELLING OR PAIN  UNUSUAL VAGINAL DISCHARGE OR ITCHING   Items with * indicate a potential emergency and should be followed up as soon as possible or go to the Emergency Department if any problems should occur.  Please show the CHEMOTHERAPY ALERT CARD or IMMUNOTHERAPY ALERT  CARD at check-in to the Emergency Department and triage nurse.  Should you have questions after your visit or need to cancel or reschedule your appointment, please contact [facility] [place] [place] [facility]  [phone] and follow the prompts.  Office hours are 8:00 a.m. to 4:30 p.m. Monday - Friday. Please note that voicemails left after 4:00 p.m. may not be returned until the following business day.  We are closed weekends and major holidays. You have access to a nurse at all times for urgent questions. Please call the main number to the clinic [phone] and follow the prompts.  For any non-urgent questions, you may also contact your provider using MyChart. We now offer e-Visits for anyone 18 and older to request care online for non-urgent symptoms. For details visit [email or web address].   Also download the MyChart app! Go to the app store, search "MyChart", open the app, select , and log in with your MyChart username and password.    

## 2023-06-24 NOTE — Assessment & Plan Note (Signed)
Continue calcium supplementation, currently on 1100 mg twice daily.   Recommend Vitamin D 1000 units daily.

## 2023-06-24 NOTE — Assessment & Plan Note (Addendum)
#  Recurrent lower extremity DVT, secondary to antiphospholipid syndrome.-. Need  life time anticoagulation Currently on coumadin with INR goal of 2-3.   INR improved, 3.4 INR result was sent to PCP's office for coumadin dosing.

## 2023-06-24 NOTE — Progress Notes (Signed)
Hematology/Oncology Progress note [phone] [phone]    CHIEF COMPLAINTS/REASON FOR VISIT:  Follow up for right lower extremity DVT, antiphospholipid syndrome, metastatic cholangiocarcinoma.  ASSESSMENT & PLAN:   Cancer Staging  Cholangiocarcinoma [facility]) Staging form: Intrahepatic Bile Duct, AJCC 8th Edition - Clinical stage from 08/27/2022: Stage IV (cT2, cN1, cM1) - Signed by [name], MD on 09/13/2022   Cholangiocarcinoma (HCC) Cholangiocarcinoma, NGS-Tempus liquid biopsy FGFR2 -ADK mutation, TMB 0,  S/p Gemcitabine, Cisplatin and Durvalumab x 6 , 01/2023 CT progression/enlarged liver lesions --03/03/23 2nd line Futibatinib- 04/26/23 CT showed interval progression  Recommend patient to stop Futibatinib. [facility] 2nd opinion was reviewed.  Recommend to proceed with 3rd line treatment with FOLFOX  Labs are reviewed and discussed with patient. Proceed with FOLFOX today.    Antiphospholipid syndrome (HCC) #Recurrent lower extremity DVT, secondary to antiphospholipid syndrome.-. Need  life time anticoagulation Currently on coumadin with INR goal of 2-3.   INR improved, 3.4 INR result was sent to PCP's office for coumadin dosing.    Atrial fibrillation (HCC) And cardiomyopathy, follow up with cardiology Rate controlled. S/p ablation on 03/26/23   Weight loss Follow with nutritionist.   Metastasis to bone [facility]) S/p palliative  radiation  Consider bisphosphonate after dental clearance is obtained. S/p left ischium.  radiation due to concern of pathological fracture.  Right thigh pain, no RT per Radonc. Felt was due to arthritis.  Patient will follow up with Orthopedic Surgeon for steroid injection.   S/p fall yesterday, obtain right femur and right hip to rule out fracture - results are pending.    Hypocalcemia Continue calcium supplementation, currently on 1100 mg twice daily.   Recommend Vitamin D 1000 units daily.   Frequent falls Refer to  physical therapy  Orders Placed This Encounter  Procedures   Protime-INR    Standing Status:   Future    Standing Expiration Date:   07/21/2024   Protime-INR    Standing Status:   Future    Standing Expiration Date:   08/04/2024   Cancer antigen 19-9    Standing Status:   Future    Standing Expiration Date:   08/04/2024   CEA    Standing Status:   Future    Standing Expiration Date:   08/04/2024   CBC with Differential (Cancer Center Only)    Standing Status:   Future    Standing Expiration Date:   08/04/2024   CMP (Cancer Center only)    Standing Status:   Future    Standing Expiration Date:   08/04/2024   Follow up 2 weeks.   All questions were answered. The patient knows to call the clinic with any problems, questions or concerns.  [name], MD, PhD [facility] Hematology Oncology 06/24/2023    HISTORY OF PRESENTING ILLNESS:  Patient reports remote history of left lower extremity DVT in 2002.  He was initiated on Lovenox and bridged to Coumadin.  Patient took warfarin for 2 years before anticoagulation was stopped. 12/03/2021, patient presented to emergency room for evaluation of right lower extremity pain and swelling for about a week.  Started on the inner side of right thigh and migrated to the right calf. + Associated with swelling.  Denies any recent injury, hospitalization, surgery.  He first noticed the symptoms after playing basketball with his grandson.   12/03/2021, right lower extremity ultrasound showed occlusive DVT extending from the mid aspect of the femoral vein through the imaged tibial vein.  Age-indeterminate. Patient was started  on Xarelto. He was referred to establish care with vascular surgeon and was seen by Sheppard Plumber on 12/06/2021.  Shared decision was made not to proceed with embolectomy.  Continue anticoagulation. Patient was referred to hematology oncology for further evaluation.  Patient denies any family history of blood clots.  Denies any unintentional  weight loss, fever or night sweats. 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  He sometimes stay in his car while waiting for next assessment appointment.  He reports the right lower extremity symptom has improved since the start of Xarelto.  No active bleeding events.  #12/18/2021 hypercoagulable work-up showed JAK2 V617F mutation negative, with reflex to other mutations CALR, MPL, JAK 2 Ex 12-15 mutations negative, negative anticardiolipin IgG and IgM antibodies, positive lupus anticoagulant, negative factor V Leiden mutation, negative prothrombin gene mutation, normal protein C activity, normal protein S antigen level. Patient was recommended to switch to Coumadin with INR goal of 2-3.  #03/15/2022, repeat lupus anticoagulant is persistently positive- + antiphospholipid syndrome.  Patient is currently on Lovenox 1 mg/kg twice daily.  admitted due to multifocal pneumonia, treated with Influenza panel negative, COVID-negative, HIV negative, Complete course of antibiotics with Zithromax, Vantin   + HCV antibody positive, HCV RNA quantification not detected. HCV RT PCR not detected.   INTERVAL HISTORY DELORIS POARCH is a 62 y.o. male who has above history reviewed by me today presents for follow up visit for management of right lower extremity DVT and antiphospholipid syndrome, metastatic cholangiocarcinoma.  Oncology History  Cholangiocarcinoma (HCC)  10/30/2021 Imaging   CT chest wo contrast 1. Heterogeneous bilateral ground-glass disease, multilobar but worst in the right middle lobe, lingula and lower lobes. There is progression of the abnormality since 10/12/2022. Though pneumonia could produce this appearance, given persistence and fluctuating appearance since October, consider alternative entities such as hypersensitivity pneumonitis or idiopathic interstitial pneumonia. 2. Slightly enlarged  pulmonary trunk, possible arterial hypertension 3. Redemonstrated large hypodense right hepatic lobe mass.   08/18/2022 Imaging   CT chest w contrast showed 1. Multifocal airspace disease in both lungs, left side greater than right. Findings are most compatible with multifocal pneumonia. 2. Multiple new hepatic lesions with enlarging lymph nodes in the upper abdomen and chest. Findings are concerning for metastatic disease. Based on the multifocal pneumonia, hepatic abscesses would also be in the differential diagnosis. Recommend further characterization of the abdomen and pelvis with CT with IV contrast. 3. Enlargement of the main pulmonary artery could be associated with pulmonary hypertension. 4. Coronary artery calcifications. 5. Multinodular goiter. Patient has known thyroid nodules and previous thyroid ultrasound.    08/18/2022 Imaging   CT abdomen pelvis w contrast  Interval development of 6.5 x 5.2 cm heterogeneously enhancing mass in right hepatic lobe. This is highly concerning for neoplasm or malignancy, and further evaluation with MRI is recommended.   Mildly enlarged periaortic adenopathy is noted, with the largest lymph node measuring 12 mm. Metastatic disease cannot be excluded. Mildly enlarged adenopathy is also noted in the gastrohepatic ligament, but this is unchanged compared to prior exam.   Stable bibasilar lung opacities are noted concerning for inflammation, atelectasis or possibly scarring.   Grossly stable multi-septated cystic lesion is seen involving upper pole of right kidney with peripheral calcifications compared to prior exam of 2019, most likely representing benign etiology.   Aortic Atherosclerosis    08/18/2022 - 08/21/2022 Hospital Admission   Admitted due  to multifocal pneumonia, treated with Influenza panel negative, COVID-negative, HIV negative, Complete course of antibiotics with Zithromax, Vantin     08/19/2022 Imaging   MR abdomen w wo contrast   Marked caudate lobe hypertrophy, highly suspicious for hepatic cirrhosis.   Numerous hypervascular masses throughout the right hepatic lobe, highly suspicious for multifocal hepatocellular carcinoma. Recommend correlation with AFP and consider tissue sampling.   Mild abdominal lymphadenopathy, with differential diagnosis including metastatic disease and reactive lymphadenopathy in setting of cirrhosis.   Bilateral lower lobe infiltrates, as better demonstrated on recent CT.    08/19/2022 Tumor Marker   AFP 4.1   08/27/2022 Initial Diagnosis   Cholangiocarcinoma  -08/20/22 Liver mass biopsy showed poorly differentiated carcinoma Immunohistochemical stains show that the poorly differentiated carcinoma is positive for CK7 and MOC-31, suggestive of a poorly differentiated adenocarcinoma.  Immunohistochemical stains for CK20, CDX2, HepPar 1 and arginase are negative.  Immunostain for glypican-3 shows very focal  likely nonspecific labeling.  This immunoprofile is nonspecific but can be compatible with a poorly differentiated primary cholangiocarcinoma in absence of any other lesions.   NGS showed BAP 1 mutation.  Case was presented at tumor board, I have also discussed with pathologist Dr.Rubinas,  IHC pattern is more consistent with adenocarcinoma, unlikely HCC [due to negative HepPar 1 and arginase], cholangiocarcinoma is favored, although this is a diagnosis of exclusion. Second opinion ar [facility]   08/27/2022 Cancer Staging   Staging form: Intrahepatic Bile Duct, AJCC 8th Edition - Clinical stage from 08/27/2022: Stage IV (cT2, cN1, cM1) - Signed by [name], MD on 09/13/2022 Stage prefix: Initial diagnosis   08/27/2022 Tumor Marker   CA 19.9  16 CEA 2.8   09/12/2022 Imaging   PET scan showed 1. Large right hepatic lobe mass is hypermetabolic and consistent with known cholangiocarcinoma. 2. Scattered borderline enlarged upper abdominal lymph nodes do not show any significant FDG  uptake. 3. 4 hypermetabolic bone lesions consistent with metastatic disease. 4. Progressive diffuse airspace process in the left lung could be due to atypical pneumonia, pulmonary hemorrhage or left-sided aspiration. 5. Aortic atherosclerosis.  09/12/2022 I had a phone discussion with patient after his PET scan resulted.  Recommend systemic chemotherapy.  Patient would like to defer until his second opinion visit at [facility].   09/15/2022 Imaging   CT angio chest pulmonary embolism protocol showed 1. No evidence for pulmonary embolism. 2. Progression of bilateral multifocal patchy ground-glass and interstitial opacities, left greater than right. Findings are concerning for multifocal pneumonia. 3. Air-fluid levels in the left lower lobe bronchi worrisome for aspiration. 4. Mediastinal and hilar lymphadenopathy has increased in size and number when compared to the prior study, likely reactive.5. Cardiomegaly. 6. Findings compatible with pulmonary artery hypertension.7. Stable right liver mass and complex right renal cystic lesion. 8. Stable enlargement of the left thyroid.   09/15/2022 - 09/17/2022 Hospital Admission   Patient was admitted due to shortness of breath, CT showed progression of bilateral multifocal patchy groundglass and interstitial opacities.  Left greater than right.  Concerning for multifocal pneumonia.  Patient was started on broad-spectrum antibiotics with significant improvement.  He was discharged on oral antibiotics.   09/19/2022 Miscellaneous   Patient went to [facility] for second opinion.  He was seen by Shirlee Latch, who felt that the presentation is consistent with metastatic cholangiocarcinoma, and agrees with the plan of cisplatin/gemcitabine/durvalumab..    10/11/2022 - 01/31/2023 Chemotherapy   Patient is on Treatment Plan : Cisplatin D1,8 + Gemcitabine + Durvalumab  D1,8  q21d x     10/12/2022 Imaging   CT chest w contrast 1. Bilateral airspace and ground-glass  opacities have mildly decreased in amount and density when compared to the prior study. Findings are compatible with resolving multifocal pneumonia. Continued follow-up imaging recommended. 2. Stable enlarged heterogeneous left thyroid gland with 3.2 cm nodule. This can be further evaluated with dedicated ultrasound. 3. Stable hepatic and right renal lesions, incompletely evaluated.   Aortic Atherosclerosis (ICD10-I70.0).   12/11/2022 Imaging   PET showed  1. There is been mild increase in size of the tracer avid mass within right lobe of liver compatible with known cholangiocarcinoma. The degree of tracer uptake however is slightly decreased in the interval. 2. Multifocal tracer avid bone metastases are again noted. When compared with the previous exam there has been an mixed interval response to therapy. Specifically, there has been interval increase in size and FDG uptake associated with the right ischial tuberosity lesion and there is a new lesion within the posterior left iliac bone. Decreased tracer uptake is associated with the left scapular lesion and there is stable tracer uptake associated with the right T1 transverse process lesion. 3. Persistent ground-glass and airspace densities within the left upper lobe and left lower lobe compatible with inflammatory or infectious process. Interval resolution of previous right lung ground-glass and airspace densities.    12/24/2022 Procedure   Medi port placement by Dr. Wyn Quaker   01/22/2023 Imaging   CT chest abdomen pelvis with contrast 1. Hypodense lesion of the right lobe of the liver, hepatic segment VII/VIII is slightly diminished in size, consistent with treatment response. 2. No evident change in additional smaller hypodense lesions scattered throughout the right lobe of the liver, these more difficult to see on prior noncontrast PET-CT. 3. Unchanged enlarged celiac axis, gastrohepatic ligament, and retroperitoneal lymph nodes. Unchanged  prominent high right paratracheal lymph nodes. 4. Unchanged osseous metastases. 5. Slightly improved, although still extensive ground-glass airspace opacity primarily seen throughout the left lower lobe and lingula,nonspecific and infectious or inflammatory. 6. Coronary artery disease. 7. Emphysema.   02/11/2023 Imaging   Patient self decreased Lovenox dosage to 120mg  once daily.  Increased lower extremity swelling.   02/11/23 [place] bilateral lower extremity showed 1. Nonocclusive DVT within the popliteal veins bilaterally. 2. Occlusive DVT within the posterior tibial veins of the right calf.       02/13/2023 Imaging   CT chest Angiogram  1. Technically adequate exam showing no acute pulmonary embolus. 2. Cardiomegaly. Coronary artery calcifications. 3. Bilateral pleural effusions. 4. Improved aeration of the RIGHT UPPER and LOWER lobes compared to prior study. 5. LEFT LOWER lobe and lingula airspace filling and atelectasis, slightly improved compared to prior study. 6. Cirrhotic morphology of the liver. 7.7 centimeters mass in the RIGHT hepatic lobe. 7. Gastrohepatic ligament adenopathy. 8. A 7 millimeters sclerotic lesion in the UPPER sternum is suspicious for metastatic disease. 9. LEFT thyroid goiter.   02/13/2023 - 02/15/2023 Hospital Admission   A flutter with variable A-V block. Heart rate up to 140.  BNP 119.8 04/18: Echo no overt CHF - EF 50-55  Cardiology recs beta blocker to metoprolol succinate 50 mg bid, Torsemid 40mg  daily Adjusting Lovenox 14mg  BID.    03/03/2023 - 05/03/2023 Chemotherapy   Futibatinib 20mg , 03/12/23 Decreased to 16mg  daily Stopped in early July due to cancer progression.   04/24/2023 - 04/30/2023 Radiation Therapy   left ischium radiation.    05/20/2023 -  Chemotherapy   Patient is on Treatment Plan :  COLORECTAL FOLFOX q14d      INTERVAL HISTORY CHINONSO MINNIEAR is a 62 y.o. male who has above history reviewed by me today presents for follow up visit  for Cholangiocarcinoma, antiphospholipid syndrome, thrombosis + Fatigue, Appetite is good. Lost weight  He has started on Coumadin, currently on 5mg  daily.  Denies any bleeding events.   Review of Systems  Constitutional:  Positive for fatigue. Negative for appetite change, chills and fever.  HENT:   Negative for hearing loss and voice change.   Eyes:  Negative for eye problems.  Respiratory:  Negative for chest tightness, cough and shortness of breath.   Cardiovascular:  Positive for leg swelling. Negative for chest pain.  Gastrointestinal:  Negative for abdominal distention, abdominal pain, blood in stool and diarrhea.  Endocrine: Negative for hot flashes.  Genitourinary:  Negative for difficulty urinating and frequency.   Musculoskeletal:  Negative for arthralgias.  Skin:  Negative for itching.       Left butt blister is better.   Neurological:  Negative for extremity weakness.  Hematological:  Negative for adenopathy. Bruises/bleeds easily.  Psychiatric/Behavioral:  Negative for confusion.     MEDICAL HISTORY:  Past Medical History:  Diagnosis Date   Antiphospholipid antibody syndrome (HCC)    Arthritis    Cancer (HCC)    Diabetes mellitus without complication (HCC)    DVT (deep venous thrombosis) (HCC)    Lt leg   Dyspnea    Elevated lipids    Erythrocytosis    Hyperlipidemia    Hypertension    Lower extremity edema    Multinodular thyroid    Post-thrombotic syndrome     SURGICAL HISTORY: Past Surgical History:  Procedure Laterality Date   CARDIOVERSION N/A 03/22/2023   Procedure: CARDIOVERSION;  Surgeon: Antonieta Iba, MD;  Location: [facility] ORS;  Service: Cardiovascular;  Laterality: N/A;   COLONOSCOPY WITH PROPOFOL N/A 09/23/2018   Per Dr. Norma Fredrickson, polyp, repeat in 3 yrs    EYE MUSCLE SURGERY     JOINT REPLACEMENT Left 07/22/2017   PORTA CATH INSERTION N/A 12/24/2022   Procedure: PORTA CATH INSERTION;  Surgeon: Annice Needy, MD;  Location: [facility] INVASIVE CV  LAB;  Service: Cardiovascular;  Laterality: N/A;   TOTAL HIP ARTHROPLASTY Left 07/22/2017   Procedure: TOTAL HIP ARTHROPLASTY;  Surgeon: Donato Heinz, MD;  Location: [facility] ORS;  Service: Orthopedics;  Laterality: Left;    SOCIAL HISTORY: Social History   Socioeconomic History   Marital status: Married    Spouse name: Not on file   Number of children: Not on file   Years of education: Not on file   Highest education level: Not on file  Occupational History   Not on file  Tobacco Use   Smoking status: Former    Current packs/day: 0.00    Average packs/day: 0.5 packs/day for 6.0 years (3.0 ttl pk-yrs)    Types: Cigarettes    Start date: 5    Quit date: 72    Years since quitting: 42.6   Smokeless tobacco: Never   Tobacco comments:    Pt states when he did smoke he smoked at most 1 ppd. ALS 09/26/2022  Vaping Use   Vaping status: Never Used  Substance and Sexual Activity   Alcohol use: Not Currently    Comment: occ   Drug use: No   Sexual activity: Not on file  Other Topics Concern   Not on file  Social History Narrative   Not on file  Social Determinants of Health   Financial Resource Strain: Low Risk  (08/27/2022)   Overall Financial Resource Strain (CARDIA)    Difficulty of Paying Living Expenses: Not very hard  Food Insecurity: No Food Insecurity (02/18/2023)   Hunger Vital Sign    Worried About Running Out of Food in the Last Year: Never true    Ran Out of Food in the Last Year: Never true  Transportation Needs: No Transportation Needs (02/18/2023)   PRAPARE - Administrator, Civil Service (Medical): No    Lack of Transportation (Non-Medical): No  Physical Activity: Insufficiently Active (08/27/2022)   Exercise Vital Sign    Days of Exercise per Week: 3 days    Minutes of Exercise per Session: 30 min  Stress: Stress Concern Present (08/27/2022)   [employer] of Occupational Health - Occupational Stress Questionnaire    Feeling of Stress  : Rather much  Social Connections: Socially Integrated (08/27/2022)   Social Connection and Isolation Panel [NHANES]    Frequency of Communication with Friends and Family: More than three times a week    Frequency of Social Gatherings with Friends and Family: More than three times a week    Attends Religious Services: 1 to 4 times per year    Active Member of [employer] or Organizations: Yes    Attends Banker Meetings: Never    Marital Status: Married  Catering manager Violence: Not At Risk (02/14/2023)   Humiliation, Afraid, Rape, and Kick questionnaire    Fear of Current or Ex-Partner: No    Emotionally Abused: No    Physically Abused: No    Sexually Abused: No    FAMILY HISTORY: Family History  Problem Relation Age of Onset   Breast cancer Mother    Emphysema Father    Colon cancer Maternal Grandmother     ALLERGIES:  has No Known Allergies.  MEDICATIONS:  Current Outpatient Medications  Medication Sig Dispense Refill   albuterol (VENTOLIN HFA) 108 (90 Base) MCG/ACT inhaler TAKE 2 PUFFS BY MOUTH EVERY 4 HOURS AS NEEDED 8.5 each 1   Calcium Carbonate 500 MG CHEW Chew 2 tablets (1,000 mg total) by mouth daily. 60 tablet 0   diphenoxylate-atropine (LOMOTIL) 2.5-0.025 MG tablet Take 1 tablet by mouth 4 (four) times daily as needed for diarrhea or loose stools. 120 tablet 3   glipiZIDE (GLUCOTROL) 10 MG tablet Take 1 tablet (10 mg total) by mouth 2 (two) times daily before a meal. 180 tablet 3   glucose blood test strip 1 each by Other route as needed for other. accu chek aviva plusUse as instructed     ketoconazole (NIZORAL) 2 % cream Apply 1 Application topically 2 (two) times daily as needed for irritation. 60 g 5   Lactulose 20 GM/30ML SOLN Take 30 mLs (20 g total) by mouth daily as needed (constipation.). 450 mL 0   lidocaine-prilocaine (EMLA) cream Apply 1 Application topically as needed. 30 g 5   LORazepam (ATIVAN) 0.5 MG tablet TAKE 1 TABLET (0.5 MG TOTAL) BY MOUTH  EVERY 8 (EIGHT) HOURS AS NEEDED FOR ANXIETY (NAUSEA VOMITING). 60 tablet 0   magnesium chloride (SLOW-MAG) 64 MG TBEC SR tablet Take 1 tablet (64 mg total) by mouth 2 (two) times daily. 60 tablet 0   metFORMIN (GLUCOPHAGE) 500 MG tablet TAKE TWO TABLETS BY MOUTH EVERY MORNING WITH BREAKFAST 180 tablet 1   metoprolol tartrate (LOPRESSOR) 50 MG tablet Take 1 tablet (50 mg total) by mouth 2 (two) times  daily. 180 tablet 3   MUCINEX D MAX STRENGTH [phone] MG TB12 Take 1,200 mg by mouth 2 (two) times daily as needed (for congestion or to loosen mucous in the chest).     ondansetron (ZOFRAN) 4 MG tablet Take by mouth.     oxyCODONE (OXY IR/ROXICODONE) 5 MG immediate release tablet Take 1 tablet (5 mg total) by mouth every 4 (four) hours as needed for severe pain. 60 tablet 0   pravastatin (PRAVACHOL) 20 MG tablet Take 1 tablet (20 mg total) by mouth daily. 90 tablet 3   prochlorperazine (COMPAZINE) 10 MG tablet Take 10 mg by mouth every 6 (six) hours as needed.     silver sulfADIAZINE (SILVADENE) 1 % cream Apply 1 Application topically daily. 50 g 0   tadalafil (CIALIS) 20 MG tablet Take 0.5-1 tablets (10-20 mg total) by mouth every other day as needed for erectile dysfunction. 20 tablet 11   traZODone (DESYREL) 50 MG tablet TAKE 1 TABLET BY MOUTH AT BEDTIME AS NEEDED FOR SLEEP. 30 tablet 1   triamcinolone cream (KENALOG) 0.1 % Apply 1 Application topically 3 (three) times daily. 80 g 5   warfarin (COUMADIN) 5 MG tablet Take 1 tablet (5 mg total) by mouth daily. 30 tablet 5   fentaNYL (DURAGESIC) 50 MCG/HR Place 1 patch onto the skin every 3 (three) days. 10 patch 0   potassium chloride (KLOR-CON) 10 MEQ tablet Take 1 tablet (10 mEq total) by mouth daily. 90 tablet 3   torsemide (DEMADEX) 20 MG tablet Take 1 tablet (20 mg total) by mouth 2 (two) times daily as needed. 180 tablet 3   No current facility-administered medications for this visit.   Facility-Administered Medications Ordered in Other Visits   Medication Dose Route Frequency Provider Last Rate Last Admin   fluorouracil (ADRUCIL) 5,900 mg in sodium chloride 0.9 % 132 mL chemo infusion  2,400 mg/m2 (Order-Specific) Intravenous 1 day or 1 dose [name], MD   Infusion Verify at 06/24/23 1356     PHYSICAL EXAMINATION: ECOG PERFORMANCE STATUS: 1 - Symptomatic but completely ambulatory Vitals:   06/24/23 0930 06/24/23 0932  BP:  113/62  Pulse:  (!) 59  Resp: 18 18  Temp: (!) 96.7 F (35.9 C) (!) 96.7 F (35.9 C)  SpO2:  100%   Filed Weights   06/24/23 0930  Weight: 246 lb (111.6 kg)    Physical Exam Constitutional:      General: He is not in acute distress. HENT:     Head: Normocephalic and atraumatic.  Eyes:     General: No scleral icterus. Cardiovascular:     Rate and Rhythm: Normal rate.  Pulmonary:     Effort: Pulmonary effort is normal. No respiratory distress.     Breath sounds: No wheezing.  Abdominal:     General: Bowel sounds are normal. There is no distension.  Musculoskeletal:        General: Normal range of motion.     Cervical back: Normal range of motion and neck supple.     Right lower leg: Edema present.     Left lower leg: Edema present.  Skin:    General: Skin is warm and dry.     Findings: No erythema.  Neurological:     Mental Status: He is alert and oriented to person, place, and time. Mental status is at baseline.     Cranial Nerves: No cranial nerve deficit.  Psychiatric:        Mood and Affect: Mood  normal.     LABORATORY DATA:  I have reviewed the data as listed    Latest Ref Rng & Units 06/24/2023    9:12 AM 06/17/2023    8:43 AM 06/03/2023    8:03 AM  CBC  WBC 4.0 - 10.5 K/uL 2.7  4.2  3.1   Hemoglobin 13.0 - 17.0 g/dL 40.9  81.1  91.4   Hematocrit 39.0 - 52.0 % 34.9  34.8  33.1   Platelets 150 - 400 K/uL 169  116  131       Latest Ref Rng & Units 06/24/2023    9:12 AM 06/17/2023    8:43 AM 06/03/2023    8:03 AM  CMP  Glucose 70 - 99 mg/dL 88  57  98   BUN 8 - 23 mg/dL 9   9  5    Creatinine 0.61 - 1.24 mg/dL 7.82  9.56  2.13   Sodium 135 - 145 mmol/L 132  131  134   Potassium 3.5 - 5.1 mmol/L 3.9  3.9  3.5   Chloride 98 - 111 mmol/L 103  100  102   CO2 22 - 32 mmol/L 25  24  26    Calcium 8.9 - 10.3 mg/dL 8.6  8.6  8.8   Total Protein 6.5 - 8.1 g/dL 5.9  6.1  5.8   Total Bilirubin 0.3 - 1.2 mg/dL 0.8  0.9  0.8   Alkaline Phos 38 - 126 U/L 111  94  70   AST 15 - 41 U/L 35  38  26   ALT 0 - 44 U/L 20  19  14      RADIOGRAPHIC STUDIES: I have personally reviewed the radiological images as listed and agreed with the findings in the report. No results found.

## 2023-06-24 NOTE — Progress Notes (Cosign Needed Addendum)
Pt had INR lab draw today at oncology infusion center.  Hold warfarin today and then change weekly dose to take 1 tablet daily except take 1/2 tablet on Mondays and Thursdays. Recheck on 9/9 at oncology infusion center.   LVM with dosing instructions.  Sent msg to oncology for retest date request.   Oncology nurse responded concerning next test date and she has added the order.   Contacted pt and he reports he did get the msg concerning warfarin dosing. He would like this nurse to send him a mychart msg so if he did have any questions in the future he could send them directly to the coumadin nurse. Advised if any changes to let the coumadin clinic know. Pt verbalized understanding.  Sent mychart msg.

## 2023-06-24 NOTE — Assessment & Plan Note (Signed)
Refer to physical therapy

## 2023-06-24 NOTE — Assessment & Plan Note (Signed)
And cardiomyopathy, follow up with cardiology Rate controlled. S/p ablation on 03/26/23  

## 2023-06-24 NOTE — Assessment & Plan Note (Signed)
 Follow with nutritionist.

## 2023-06-24 NOTE — Assessment & Plan Note (Addendum)
S/p palliative  radiation  Consider bisphosphonate after dental clearance is obtained. S/p left ischium.  radiation due to concern of pathological fracture.  Right thigh pain, no RT per Radonc. Felt was due to arthritis.  Patient will follow up with Orthopedic Surgeon for steroid injection.   S/p fall yesterday, obtain right femur and right hip to rule out fracture - results are pending.

## 2023-06-24 NOTE — Patient Instructions (Addendum)
Pre visit review using our clinic review tool, if applicable. No additional management support is needed unless otherwise documented below in the visit note.  Hold warfarin today and then change weekly dose to take 1 tablet daily except take 1/2 tablet on Mondays and Thursdays. Recheck on 9/9 at oncology infusion center.

## 2023-06-25 LAB — CEA: CEA: 9.2 ng/mL — ABNORMAL HIGH (ref 0.0–4.7)

## 2023-06-25 LAB — CANCER ANTIGEN 19-9: CA 19-9: 72 U/mL — ABNORMAL HIGH (ref 0–35)

## 2023-06-26 ENCOUNTER — Inpatient Hospital Stay: Payer: BC Managed Care – PPO

## 2023-06-26 VITALS — BP 116/69 | HR 72 | Resp 18

## 2023-06-26 DIAGNOSIS — C221 Intrahepatic bile duct carcinoma: Secondary | ICD-10-CM

## 2023-06-26 DIAGNOSIS — R296 Repeated falls: Secondary | ICD-10-CM | POA: Diagnosis not present

## 2023-06-26 DIAGNOSIS — D6861 Antiphospholipid syndrome: Secondary | ICD-10-CM | POA: Diagnosis not present

## 2023-06-26 DIAGNOSIS — Z7901 Long term (current) use of anticoagulants: Secondary | ICD-10-CM | POA: Diagnosis not present

## 2023-06-26 DIAGNOSIS — I4891 Unspecified atrial fibrillation: Secondary | ICD-10-CM | POA: Diagnosis not present

## 2023-06-26 DIAGNOSIS — C7951 Secondary malignant neoplasm of bone: Secondary | ICD-10-CM | POA: Diagnosis not present

## 2023-06-26 DIAGNOSIS — Z86718 Personal history of other venous thrombosis and embolism: Secondary | ICD-10-CM | POA: Diagnosis not present

## 2023-06-26 DIAGNOSIS — Z452 Encounter for adjustment and management of vascular access device: Secondary | ICD-10-CM | POA: Diagnosis not present

## 2023-06-26 DIAGNOSIS — Z5111 Encounter for antineoplastic chemotherapy: Secondary | ICD-10-CM | POA: Diagnosis not present

## 2023-06-26 DIAGNOSIS — L598 Other specified disorders of the skin and subcutaneous tissue related to radiation: Secondary | ICD-10-CM | POA: Diagnosis not present

## 2023-06-26 MED ORDER — HEPARIN SOD (PORK) LOCK FLUSH 100 UNIT/ML IV SOLN
500.0000 [IU] | Freq: Once | INTRAVENOUS | Status: AC | PRN
  Administered 2023-06-26: 500 [IU]
  Filled 2023-06-26: qty 5

## 2023-06-26 MED ORDER — SODIUM CHLORIDE 0.9% FLUSH
10.0000 mL | INTRAVENOUS | Status: DC | PRN
  Administered 2023-06-26: 10 mL
  Filled 2023-06-26: qty 10

## 2023-07-01 ENCOUNTER — Encounter: Payer: Self-pay | Admitting: Pulmonary Disease

## 2023-07-02 ENCOUNTER — Other Ambulatory Visit: Payer: BC Managed Care – PPO

## 2023-07-02 ENCOUNTER — Ambulatory Visit: Payer: BC Managed Care – PPO

## 2023-07-02 ENCOUNTER — Ambulatory Visit: Payer: BC Managed Care – PPO | Admitting: Oncology

## 2023-07-03 ENCOUNTER — Encounter: Payer: Self-pay | Admitting: Oncology

## 2023-07-04 ENCOUNTER — Other Ambulatory Visit: Payer: Self-pay | Admitting: Oncology

## 2023-07-04 ENCOUNTER — Inpatient Hospital Stay: Payer: BC Managed Care – PPO | Attending: Oncology

## 2023-07-04 DIAGNOSIS — M1611 Unilateral primary osteoarthritis, right hip: Secondary | ICD-10-CM | POA: Diagnosis not present

## 2023-07-04 DIAGNOSIS — Z7901 Long term (current) use of anticoagulants: Secondary | ICD-10-CM | POA: Insufficient documentation

## 2023-07-04 DIAGNOSIS — Z5111 Encounter for antineoplastic chemotherapy: Secondary | ICD-10-CM | POA: Insufficient documentation

## 2023-07-04 DIAGNOSIS — R630 Anorexia: Secondary | ICD-10-CM | POA: Insufficient documentation

## 2023-07-04 DIAGNOSIS — C221 Intrahepatic bile duct carcinoma: Secondary | ICD-10-CM | POA: Insufficient documentation

## 2023-07-04 DIAGNOSIS — I4891 Unspecified atrial fibrillation: Secondary | ICD-10-CM | POA: Insufficient documentation

## 2023-07-04 DIAGNOSIS — C7951 Secondary malignant neoplasm of bone: Secondary | ICD-10-CM | POA: Insufficient documentation

## 2023-07-04 DIAGNOSIS — E876 Hypokalemia: Secondary | ICD-10-CM | POA: Insufficient documentation

## 2023-07-04 DIAGNOSIS — R634 Abnormal weight loss: Secondary | ICD-10-CM | POA: Insufficient documentation

## 2023-07-04 DIAGNOSIS — Z23 Encounter for immunization: Secondary | ICD-10-CM | POA: Insufficient documentation

## 2023-07-04 DIAGNOSIS — I429 Cardiomyopathy, unspecified: Secondary | ICD-10-CM | POA: Insufficient documentation

## 2023-07-04 DIAGNOSIS — Z79899 Other long term (current) drug therapy: Secondary | ICD-10-CM | POA: Insufficient documentation

## 2023-07-04 DIAGNOSIS — D6861 Antiphospholipid syndrome: Secondary | ICD-10-CM | POA: Insufficient documentation

## 2023-07-04 NOTE — Progress Notes (Signed)
Nutrition Follow-up:  Patient with cholangiocarcinoma.  Patient receiving 3rd line folfox.  Spoke with patient via phone for nutrition follow-up.  Reports that he feels like "crap". Has not had much of an appetite until today since his last treatment.  Says that he does not want to eat and if he does eat he feels nauseated.  Has nausea medications.  Unsure if he should continue treatment for this month before starting clinical trial on October 1.     Medications: reviewed  Labs: reviewed  Anthropometrics:   Weight 246 lb 06/24/23 258 lb on 7/29 275 lb 11.2 oz on 6/13 287 lb 3.2 oz on 5/29   NUTRITION DIAGNOSIS: Unintentional weight loss continues    INTERVENTION:  Message sent to MD and team with patient's concerns and requesting team to contact patient.  Discussed option of appetite stimulant.   Offered [facility] clinic evaluation and possible IV fluids but declined    MONITORING, EVALUATION, GOAL: weight trends, intake   NEXT VISIT: to be determined  Stefanee Mckell B. Freida Busman, RD, LDN Registered Dietitian [phone] .

## 2023-07-05 ENCOUNTER — Ambulatory Visit: Payer: BC Managed Care – PPO | Admitting: Oncology

## 2023-07-05 ENCOUNTER — Other Ambulatory Visit: Payer: BC Managed Care – PPO

## 2023-07-05 ENCOUNTER — Other Ambulatory Visit: Payer: Self-pay | Admitting: Oncology

## 2023-07-05 ENCOUNTER — Ambulatory Visit: Payer: BC Managed Care – PPO

## 2023-07-05 ENCOUNTER — Telehealth: Payer: Self-pay

## 2023-07-05 DIAGNOSIS — C221 Intrahepatic bile duct carcinoma: Secondary | ICD-10-CM

## 2023-07-05 DIAGNOSIS — R059 Cough, unspecified: Secondary | ICD-10-CM

## 2023-07-05 NOTE — Telephone Encounter (Signed)
-----   Message from [name] sent at 07/04/2023  9:43 PM EDT ----- Thanks Drema Halon. My clinic on 9/6 is very full and not able to move appt earlier. Team please reach out and arrange him to see palliative care. He can keep appt with me on 9/9, telemedicine will work as well. Thanks.  zy ----- Message ----- From: Alphonse Guild, RD Sent: 07/04/2023   3:56 PM EDT To: Coralee Rud, RN; Paulita Fujita, CMA; #  Dr Cathie Hoops,  I spoke with Mr Walls via phone this afternoon.  He is considering stopping treatment until he can get on clinical trial October 1.  He has not felt good or had much of an appetite.  He wants to talk with you before his appointment on 9/9 regarding if he should stop treatment this month and try and eat or continue with treatment and feel bad.    I discussed option of appetite stimulant with him and [facility] with fluids and he wants to talk with you further.  Can you and/or your team reach out to him?    Thanks, [employer]

## 2023-07-05 NOTE — Telephone Encounter (Signed)
Called pt and he states that he cannot come in today for palliative care appt, but will be here for Monday's (9/9) appt. He also states that he has been having the following upper respiratory symptoms: coughing and green sputum. Advised for him to get chest xray prior to appt on Monday. Pt verbalized understanding.   Please add palliative care to appt on 9/9.

## 2023-07-06 ENCOUNTER — Ambulatory Visit
Admission: RE | Admit: 2023-07-06 | Discharge: 2023-07-06 | Disposition: A | Discharge status: Home / Self Care | Payer: BC Managed Care – PPO | Source: Ambulatory Visit | Attending: Oncology | Admitting: Oncology

## 2023-07-06 ENCOUNTER — Ambulatory Visit
Admission: RE | Admit: 2023-07-06 | Discharge: 2023-07-06 | Disposition: A | Discharge status: Home / Self Care | Payer: BC Managed Care – PPO | Attending: Oncology | Admitting: Oncology

## 2023-07-06 ENCOUNTER — Other Ambulatory Visit: Payer: Self-pay | Admitting: Oncology

## 2023-07-06 DIAGNOSIS — R059 Cough, unspecified: Secondary | ICD-10-CM

## 2023-07-07 ENCOUNTER — Other Ambulatory Visit: Payer: Self-pay | Admitting: Oncology

## 2023-07-07 ENCOUNTER — Other Ambulatory Visit: Payer: Self-pay

## 2023-07-08 ENCOUNTER — Other Ambulatory Visit: Payer: Self-pay

## 2023-07-08 ENCOUNTER — Inpatient Hospital Stay: Payer: BC Managed Care – PPO

## 2023-07-08 ENCOUNTER — Inpatient Hospital Stay (HOSPITAL_BASED_OUTPATIENT_CLINIC_OR_DEPARTMENT_OTHER): Payer: BC Managed Care – PPO | Admitting: Hospice and Palliative Medicine

## 2023-07-08 ENCOUNTER — Inpatient Hospital Stay (HOSPITAL_BASED_OUTPATIENT_CLINIC_OR_DEPARTMENT_OTHER): Payer: BC Managed Care – PPO | Admitting: Oncology

## 2023-07-08 ENCOUNTER — Telehealth: Payer: Self-pay | Admitting: Oncology

## 2023-07-08 ENCOUNTER — Encounter: Payer: Self-pay | Admitting: Hospice and Palliative Medicine

## 2023-07-08 ENCOUNTER — Encounter: Payer: Self-pay | Admitting: Oncology

## 2023-07-08 ENCOUNTER — Ambulatory Visit: Payer: BC Managed Care – PPO

## 2023-07-08 ENCOUNTER — Ambulatory Visit (INDEPENDENT_AMBULATORY_CARE_PROVIDER_SITE_OTHER): Payer: BC Managed Care – PPO

## 2023-07-08 ENCOUNTER — Inpatient Hospital Stay: Payer: BC Managed Care – PPO | Admitting: Oncology

## 2023-07-08 VITALS — BP 121/50 | HR 56 | Temp 97.2°F | Resp 20 | Ht 75.0 in | Wt 232.0 lb

## 2023-07-08 DIAGNOSIS — R630 Anorexia: Secondary | ICD-10-CM | POA: Diagnosis not present

## 2023-07-08 DIAGNOSIS — Z515 Encounter for palliative care: Secondary | ICD-10-CM

## 2023-07-08 DIAGNOSIS — Z23 Encounter for immunization: Secondary | ICD-10-CM

## 2023-07-08 DIAGNOSIS — R634 Abnormal weight loss: Secondary | ICD-10-CM | POA: Diagnosis not present

## 2023-07-08 DIAGNOSIS — I4891 Unspecified atrial fibrillation: Secondary | ICD-10-CM | POA: Diagnosis not present

## 2023-07-08 DIAGNOSIS — E876 Hypokalemia: Secondary | ICD-10-CM

## 2023-07-08 DIAGNOSIS — D6861 Antiphospholipid syndrome: Secondary | ICD-10-CM

## 2023-07-08 DIAGNOSIS — I480 Paroxysmal atrial fibrillation: Secondary | ICD-10-CM

## 2023-07-08 DIAGNOSIS — Z79899 Other long term (current) drug therapy: Secondary | ICD-10-CM | POA: Diagnosis not present

## 2023-07-08 DIAGNOSIS — Z7901 Long term (current) use of anticoagulants: Secondary | ICD-10-CM | POA: Diagnosis not present

## 2023-07-08 DIAGNOSIS — C221 Intrahepatic bile duct carcinoma: Secondary | ICD-10-CM | POA: Diagnosis not present

## 2023-07-08 DIAGNOSIS — C7951 Secondary malignant neoplasm of bone: Secondary | ICD-10-CM

## 2023-07-08 DIAGNOSIS — Z5111 Encounter for antineoplastic chemotherapy: Secondary | ICD-10-CM | POA: Diagnosis not present

## 2023-07-08 DIAGNOSIS — R296 Repeated falls: Secondary | ICD-10-CM

## 2023-07-08 DIAGNOSIS — I429 Cardiomyopathy, unspecified: Secondary | ICD-10-CM | POA: Diagnosis not present

## 2023-07-08 LAB — CBC WITH DIFFERENTIAL/PLATELET
Abs Immature Granulocytes: 0.01 10*3/uL (ref 0.00–0.07)
Basophils Absolute: 0 10*3/uL (ref 0.0–0.1)
Basophils Relative: 0 %
Eosinophils Absolute: 0 10*3/uL (ref 0.0–0.5)
Eosinophils Relative: 0 %
HCT: 34.8 % — ABNORMAL LOW (ref 39.0–52.0)
Hemoglobin: 11.1 g/dL — ABNORMAL LOW (ref 13.0–17.0)
Immature Granulocytes: 0 %
Lymphocytes Relative: 8 %
Lymphs Abs: 0.2 10*3/uL — ABNORMAL LOW (ref 0.7–4.0)
MCH: 29.1 pg (ref 26.0–34.0)
MCHC: 31.9 g/dL (ref 30.0–36.0)
MCV: 91.1 fL (ref 80.0–100.0)
Monocytes Absolute: 0.4 10*3/uL (ref 0.1–1.0)
Monocytes Relative: 17 %
Neutro Abs: 1.9 10*3/uL (ref 1.7–7.7)
Neutrophils Relative %: 75 %
Platelets: 100 10*3/uL — ABNORMAL LOW (ref 150–400)
RBC: 3.82 MIL/uL — ABNORMAL LOW (ref 4.22–5.81)
RDW: 17.2 % — ABNORMAL HIGH (ref 11.5–15.5)
WBC: 2.5 10*3/uL — ABNORMAL LOW (ref 4.0–10.5)
nRBC: 0 % (ref 0.0–0.2)

## 2023-07-08 LAB — CMP (CANCER CENTER ONLY)
ALT: 24 U/L (ref 0–44)
AST: 39 U/L (ref 15–41)
Albumin: 3.2 g/dL — ABNORMAL LOW (ref 3.5–5.0)
Alkaline Phosphatase: 115 U/L (ref 38–126)
Anion gap: 6 (ref 5–15)
BUN: 10 mg/dL (ref 8–23)
CO2: 24 mmol/L (ref 22–32)
Calcium: 8.4 mg/dL — ABNORMAL LOW (ref 8.9–10.3)
Chloride: 103 mmol/L (ref 98–111)
Creatinine: 0.75 mg/dL (ref 0.61–1.24)
GFR, Estimated: >60 mL/min (ref >60)
Glucose, Bld: 94 mg/dL (ref 70–99)
Potassium: 3.2 mmol/L — ABNORMAL LOW (ref 3.5–5.1)
Sodium: 133 mmol/L — ABNORMAL LOW (ref 135–145)
Total Bilirubin: 0.9 mg/dL (ref 0.3–1.2)
Total Protein: 6 g/dL — ABNORMAL LOW (ref 6.5–8.1)

## 2023-07-08 LAB — PROTIME-INR
INR: 3.6 — ABNORMAL HIGH (ref 0.8–1.2)
Prothrombin Time: 35.8 s — ABNORMAL HIGH (ref 11.4–15.2)

## 2023-07-08 MED ORDER — HEPARIN SOD (PORK) LOCK FLUSH 100 UNIT/ML IV SOLN
500.0000 [IU] | Freq: Once | INTRAVENOUS | Status: DC
  Filled 2023-07-08: qty 5

## 2023-07-08 MED ORDER — INFLUENZA VIRUS VACC SPLIT PF (FLUZONE) 0.5 ML IM SUSY
0.5000 mL | PREFILLED_SYRINGE | Freq: Once | INTRAMUSCULAR | Status: AC
  Administered 2023-07-08: 0.5 mL via INTRAMUSCULAR
  Filled 2023-07-08: qty 0.5

## 2023-07-08 MED ORDER — HEPARIN SOD (PORK) LOCK FLUSH 100 UNIT/ML IV SOLN
500.0000 [IU] | Freq: Once | INTRAVENOUS | Status: AC
  Administered 2023-07-08: 500 [IU] via INTRAVENOUS
  Filled 2023-07-08: qty 5

## 2023-07-08 MED ORDER — SODIUM CHLORIDE 0.9% FLUSH
10.0000 mL | Freq: Once | INTRAVENOUS | Status: DC
  Filled 2023-07-08: qty 10

## 2023-07-08 MED FILL — Oxycodone HCl Tab 5 MG: ORAL | 10 days supply | Qty: 60 | Fill #0 | Status: AC

## 2023-07-08 NOTE — Assessment & Plan Note (Signed)
Due to poor oral intake secondary to decreased appetite.  He feels that appetite is coming back today.  I recommend appetite stimulants which he would like to hold off for now. Continue nutritional supplementation. Follow with nutritionist.

## 2023-07-08 NOTE — Assessment & Plan Note (Addendum)
Continue calcium supplementation, currently on 1000 mg twice daily.   Recommend Vitamin D 1000 units daily.

## 2023-07-08 NOTE — Assessment & Plan Note (Signed)
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 [facility] 2nd opinion was reviewed.  Recommend to proceed with 3rd line treatment with FOLFOX  Labs are reviewed and discussed with patient. He does not tolerate current chemotherapy, will hold off treatment today.  Options of stop chemotherapy and proceed with comfort care/hospice versus take a chemotherapy break, if he improves, may try dose reduced FOLFOX or single agent 5-FU. Shared decision was made to proceed with a CT scan for evaluation of treatment response after 3 cycles of FOLFOX and then decide.

## 2023-07-08 NOTE — Assessment & Plan Note (Signed)
#  Recurrent lower extremity DVT, secondary to antiphospholipid syndrome.-. Need  life time anticoagulation Currently on coumadin with INR goal of 2-3.   INR improved, 3.6.  Super therapeutic INR could be secondary to poor oral intake.  I will recheck INR in 1 week INR result will be sent to PCP's office for coumadin dosing.

## 2023-07-08 NOTE — Progress Notes (Signed)
Hematology/Oncology Progress note [phone] Fax:([phone]) [phone]    CHIEF COMPLAINTS/REASON FOR VISIT:  Follow up for right lower extremity DVT, antiphospholipid syndrome, metastatic cholangiocarcinoma.  ASSESSMENT & PLAN:   Cancer Staging  Cholangiocarcinoma [facility]) Staging form: Intrahepatic Bile Duct, AJCC 8th Edition - Clinical stage from 08/27/2022: Stage IV (cT2, cN1, cM1) - Signed by [name], MD on 09/13/2022   Cholangiocarcinoma (HCC) Cholangiocarcinoma, NGS-Tempus liquid biopsy FGFR2 -ADK mutation, TMB 0,  S/p Gemcitabine, Cisplatin and Durvalumab x 6 , 01/2023 CT progression/enlarged liver lesions --03/03/23 2nd line Futibatinib- 04/26/23 CT showed interval progression  Recommend patient to stop Futibatinib. [facility] 2nd opinion was reviewed.  Recommend to proceed with 3rd line treatment with FOLFOX  Labs are reviewed and discussed with patient. He does not tolerate current chemotherapy, will hold off treatment today.  Options of stop chemotherapy and proceed with comfort care/hospice versus take a chemotherapy break, if he improves, may try dose reduced FOLFOX or single agent 5-FU. Shared decision was made to proceed with a CT scan for evaluation of treatment response after 3 cycles of FOLFOX and then decide.   Antiphospholipid syndrome (HCC) #Recurrent lower extremity DVT, secondary to antiphospholipid syndrome.-. Need  life time anticoagulation Currently on coumadin with INR goal of 2-3.   INR improved, 3.6.  Super therapeutic INR could be secondary to poor oral intake.  I will recheck INR in 1 week INR result will be sent to PCP's office for coumadin dosing.    Hypocalcemia Continue calcium supplementation, currently on 1000 mg twice daily.   Recommend Vitamin D 1000 units daily.   Metastasis to bone [facility]) S/p palliative  radiation  Consider bisphosphonate after dental clearance is obtained. S/p left ischium.  radiation due to concern of pathological  fracture.  Right thigh pain, no RT per Radonc. Felt was due to arthritis.     Atrial fibrillation (HCC) And cardiomyopathy, follow up with cardiology Rate controlled. S/p ablation on 03/26/23   Weight loss Due to poor oral intake secondary to decreased appetite.  He feels that appetite is coming back today.  I recommend appetite stimulants which he would like to hold off for now. Continue nutritional supplementation. Follow with nutritionist.   Frequent falls Follow-up with physical therapy  Hypokalemia Recommend patient continue potassium chloride 20 meq BID  Recommend influenza vaccination.  He received in office. Orders Placed This Encounter  Procedures   CT CHEST ABDOMEN PELVIS W CONTRAST    [facility] [id number]) epic WT 232 yes Diabetes No allergies to contrast dye No kidney cancer/kidney surgery/yes both kidneys No special needs/yes port attached-pansy If you need to cancel your appt, please do so 24 hrs prior to your appointment to avoid getting charged a no show fee of $75 PT is aware/PT verbal understood instructions given Pansy with Morrie Sheldon @ [phone] Dr's office to make pt aware to p/u contrast-pansy    Standing Status:   Future    Standing Expiration Date:   07/07/2024    Order Specific Question:   If indicated for the ordered procedure, I authorize the administration of contrast media per Radiology protocol    Answer:   Yes    Order Specific Question:   Does the patient have a contrast media/X-ray dye allergy?    Answer:   No    Order Specific Question:   Preferred imaging location?    Answer:   [facility]    Order Specific Question:   If indicated for the ordered procedure, I authorize the  administration of oral contrast media per Radiology protocol    Answer:   Yes   Follow up 1 week after CT scan..   All questions were answered. The patient knows to call the clinic with any problems, questions or concerns.  [name], MD, PhD [facility]  Hematology Oncology 07/08/2023    HISTORY OF PRESENTING ILLNESS:  Patient reports remote history of left lower extremity DVT in 2002.  He was initiated on Lovenox and bridged to Coumadin.  Patient took warfarin for 2 years before anticoagulation was stopped. 12/03/2021, patient presented to emergency room for evaluation of right lower extremity pain and swelling for about a week.  Started on the inner side of right thigh and migrated to the right calf. + Associated with swelling.  Denies any recent injury, hospitalization, surgery.  He first noticed the symptoms after playing basketball with his grandson.   12/03/2021, right lower extremity ultrasound showed occlusive DVT extending from the mid aspect of the femoral vein through the imaged tibial vein.  Age-indeterminate. Patient was started on Xarelto. He was referred to establish care with vascular surgeon and was seen by Sheppard Plumber on 12/06/2021.  Shared decision was made not to proceed with embolectomy.  Continue anticoagulation. Patient was referred to hematology oncology for further evaluation.  Patient denies any family history of blood clots.  Denies any unintentional weight loss, fever or night sweats. 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  He sometimes stay in his car while waiting for next assessment appointment.  He reports the right lower extremity symptom has improved since the start of Xarelto.  No active bleeding events.  #12/18/2021 hypercoagulable work-up showed JAK2 V617F mutation negative, with reflex to other mutations CALR, MPL, JAK 2 Ex 12-15 mutations negative, negative anticardiolipin IgG and IgM antibodies, positive lupus anticoagulant, negative factor V Leiden mutation, negative prothrombin gene mutation, normal protein C activity, normal protein S antigen level. Patient was recommended to switch to Coumadin with INR goal of  2-3.  #03/15/2022, repeat lupus anticoagulant is persistently positive- + antiphospholipid syndrome.  Patient is currently on Lovenox 1 mg/kg twice daily.  admitted due to multifocal pneumonia, treated with Influenza panel negative, COVID-negative, HIV negative, Complete course of antibiotics with Zithromax, Vantin   + HCV antibody positive, HCV RNA quantification not detected. HCV RT PCR not detected.   INTERVAL HISTORY Christopher Marsh is a 62 y.o. male who has above history reviewed by me today presents for follow up visit for management of right lower extremity DVT and antiphospholipid syndrome, metastatic cholangiocarcinoma.  Oncology History  Cholangiocarcinoma (HCC)  10/30/2021 Imaging   CT chest wo contrast 1. Heterogeneous bilateral ground-glass disease, multilobar but worst in the right middle lobe, lingula and lower lobes. There is progression of the abnormality since 10/12/2022. Though pneumonia could produce this appearance, given persistence and fluctuating appearance since October, consider alternative entities such as hypersensitivity pneumonitis or idiopathic interstitial pneumonia. 2. Slightly enlarged pulmonary trunk, possible arterial hypertension 3. Redemonstrated large hypodense right hepatic lobe mass.   08/18/2022 Imaging   CT chest w contrast showed 1. Multifocal airspace disease in both lungs, left side greater than right. Findings are most compatible with multifocal pneumonia. 2. Multiple new hepatic lesions with enlarging lymph nodes in the upper abdomen and chest. Findings are concerning for metastatic disease. Based on the multifocal pneumonia, hepatic abscesses would also be in the differential diagnosis. Recommend further characterization of  the abdomen and pelvis with CT with IV contrast. 3. Enlargement of the main pulmonary artery could be associated with pulmonary hypertension. 4. Coronary artery calcifications. 5. Multinodular goiter. Patient has known  thyroid nodules and previous thyroid ultrasound.    08/18/2022 Imaging   CT abdomen pelvis w contrast  Interval development of 6.5 x 5.2 cm heterogeneously enhancing mass in right hepatic lobe. This is highly concerning for neoplasm or malignancy, and further evaluation with MRI is recommended.   Mildly enlarged periaortic adenopathy is noted, with the largest lymph node measuring 12 mm. Metastatic disease cannot be excluded. Mildly enlarged adenopathy is also noted in the gastrohepatic ligament, but this is unchanged compared to prior exam.   Stable bibasilar lung opacities are noted concerning for inflammation, atelectasis or possibly scarring.   Grossly stable multi-septated cystic lesion is seen involving upper pole of right kidney with peripheral calcifications compared to prior exam of 2019, most likely representing benign etiology.   Aortic Atherosclerosis    08/18/2022 - 08/21/2022 Hospital Admission   Admitted due to multifocal pneumonia, treated with Influenza panel negative, COVID-negative, HIV negative, Complete course of antibiotics with Zithromax, Vantin     08/19/2022 Imaging   MR abdomen w wo contrast  Marked caudate lobe hypertrophy, highly suspicious for hepatic cirrhosis.   Numerous hypervascular masses throughout the right hepatic lobe, highly suspicious for multifocal hepatocellular carcinoma. Recommend correlation with AFP and consider tissue sampling.   Mild abdominal lymphadenopathy, with differential diagnosis including metastatic disease and reactive lymphadenopathy in setting of cirrhosis.   Bilateral lower lobe infiltrates, as better demonstrated on recent CT.    08/19/2022 Tumor Marker   AFP 4.1   08/27/2022 Initial Diagnosis   Cholangiocarcinoma  -08/20/22 Liver mass biopsy showed poorly differentiated carcinoma Immunohistochemical stains show that the poorly differentiated carcinoma is positive for CK7 and MOC-31, suggestive of a poorly  differentiated adenocarcinoma.  Immunohistochemical stains for CK20, CDX2, HepPar 1 and arginase are negative.  Immunostain for glypican-3 shows very focal  likely nonspecific labeling.  This immunoprofile is nonspecific but can be compatible with a poorly differentiated primary cholangiocarcinoma in absence of any other lesions.   NGS showed BAP 1 mutation.  Case was presented at tumor board, I have also discussed with pathologist Dr.Rubinas,  IHC pattern is more consistent with adenocarcinoma, unlikely HCC [due to negative HepPar 1 and arginase], cholangiocarcinoma is favored, although this is a diagnosis of exclusion. Second opinion ar [facility]   08/27/2022 Cancer Staging   Staging form: Intrahepatic Bile Duct, AJCC 8th Edition - Clinical stage from 08/27/2022: Stage IV (cT2, cN1, cM1) - Signed by [name], MD on 09/13/2022 Stage prefix: Initial diagnosis   08/27/2022 Tumor Marker   CA 19.9  16 CEA 2.8   09/12/2022 Imaging   PET scan showed 1. Large right hepatic lobe mass is hypermetabolic and consistent with known cholangiocarcinoma. 2. Scattered borderline enlarged upper abdominal lymph nodes do not show any significant FDG uptake. 3. 4 hypermetabolic bone lesions consistent with metastatic disease. 4. Progressive diffuse airspace process in the left lung could be due to atypical pneumonia, pulmonary hemorrhage or left-sided aspiration. 5. Aortic atherosclerosis.  09/12/2022 I had a phone discussion with patient after his PET scan resulted.  Recommend systemic chemotherapy.  Patient would like to defer until his second opinion visit at [facility].   09/15/2022 Imaging   CT angio chest pulmonary embolism protocol showed 1. No evidence for pulmonary embolism. 2. Progression of bilateral multifocal patchy ground-glass and interstitial opacities, left greater  than right. Findings are concerning for multifocal pneumonia. 3. Air-fluid levels in the left lower lobe bronchi worrisome for  aspiration. 4. Mediastinal and hilar lymphadenopathy has increased in size and number when compared to the prior study, likely reactive.5. Cardiomegaly. 6. Findings compatible with pulmonary artery hypertension.7. Stable right liver mass and complex right renal cystic lesion. 8. Stable enlargement of the left thyroid.   09/15/2022 - 09/17/2022 Hospital Admission   Patient was admitted due to shortness of breath, CT showed progression of bilateral multifocal patchy groundglass and interstitial opacities.  Left greater than right.  Concerning for multifocal pneumonia.  Patient was started on broad-spectrum antibiotics with significant improvement.  He was discharged on oral antibiotics.   09/19/2022 Miscellaneous   Patient went to [facility] for second opinion.  He was seen by Shirlee Latch, who felt that the presentation is consistent with metastatic cholangiocarcinoma, and agrees with the plan of cisplatin/gemcitabine/durvalumab..    10/11/2022 - 01/31/2023 Chemotherapy   Patient is on Treatment Plan : Cisplatin D1,8 + Gemcitabine + Durvalumab  D1,8 q21d x     10/12/2022 Imaging   CT chest w contrast 1. Bilateral airspace and ground-glass opacities have mildly decreased in amount and density when compared to the prior study. Findings are compatible with resolving multifocal pneumonia. Continued follow-up imaging recommended. 2. Stable enlarged heterogeneous left thyroid gland with 3.2 cm nodule. This can be further evaluated with dedicated ultrasound. 3. Stable hepatic and right renal lesions, incompletely evaluated.   Aortic Atherosclerosis (ICD10-I70.0).   12/11/2022 Imaging   PET showed  1. There is been mild increase in size of the tracer avid mass within right lobe of liver compatible with known cholangiocarcinoma. The degree of tracer uptake however is slightly decreased in the interval. 2. Multifocal tracer avid bone metastases are again noted. When compared with the previous exam there has  been an mixed interval response to therapy. Specifically, there has been interval increase in size and FDG uptake associated with the right ischial tuberosity lesion and there is a new lesion within the posterior left iliac bone. Decreased tracer uptake is associated with the left scapular lesion and there is stable tracer uptake associated with the right T1 transverse process lesion. 3. Persistent ground-glass and airspace densities within the left upper lobe and left lower lobe compatible with inflammatory or infectious process. Interval resolution of previous right lung ground-glass and airspace densities.    12/24/2022 Procedure   Medi port placement by Dr. Wyn Quaker   01/22/2023 Imaging   CT chest abdomen pelvis with contrast 1. Hypodense lesion of the right lobe of the liver, hepatic segment VII/VIII is slightly diminished in size, consistent with treatment response. 2. No evident change in additional smaller hypodense lesions scattered throughout the right lobe of the liver, these more difficult to see on prior noncontrast PET-CT. 3. Unchanged enlarged celiac axis, gastrohepatic ligament, and retroperitoneal lymph nodes. Unchanged prominent high right paratracheal lymph nodes. 4. Unchanged osseous metastases. 5. Slightly improved, although still extensive ground-glass airspace opacity primarily seen throughout the left lower lobe and lingula,nonspecific and infectious or inflammatory. 6. Coronary artery disease. 7. Emphysema.   02/11/2023 Imaging   Patient self decreased Lovenox dosage to 120mg  once daily.  Increased lower extremity swelling.   02/11/23 [place] bilateral lower extremity showed 1. Nonocclusive DVT within the popliteal veins bilaterally. 2. Occlusive DVT within the posterior tibial veins of the right calf.       02/13/2023 Imaging   CT chest Angiogram  1. Technically adequate exam showing  no acute pulmonary embolus. 2. Cardiomegaly. Coronary artery calcifications. 3. Bilateral  pleural effusions. 4. Improved aeration of the RIGHT UPPER and LOWER lobes compared to prior study. 5. LEFT LOWER lobe and lingula airspace filling and atelectasis, slightly improved compared to prior study. 6. Cirrhotic morphology of the liver. 7.7 centimeters mass in the RIGHT hepatic lobe. 7. Gastrohepatic ligament adenopathy. 8. A 7 millimeters sclerotic lesion in the UPPER sternum is suspicious for metastatic disease. 9. LEFT thyroid goiter.   02/13/2023 - 02/15/2023 Hospital Admission   A flutter with variable A-V block. Heart rate up to 140.  BNP 119.8 04/18: Echo no overt CHF - EF 50-55  Cardiology recs beta blocker to metoprolol succinate 50 mg bid, Torsemid 40mg  daily Adjusting Lovenox 14mg  BID.    03/03/2023 - 05/03/2023 Chemotherapy   Futibatinib 20mg , 03/12/23 Decreased to 16mg  daily Stopped in early July due to cancer progression.   04/24/2023 - 04/30/2023 Radiation Therapy   left ischium radiation.    05/20/2023 -  Chemotherapy   Patient is on Treatment Plan : COLORECTAL FOLFOX q14d      INTERVAL HISTORY Christopher Marsh is a 62 y.o. male who has above history reviewed by me today presents for follow up visit for Cholangiocarcinoma, antiphospholipid syndrome, thrombosis + Fatigue, Appetite is good. Lost weight  He has started on Coumadin, currently on 5mg , take 1 tablet daily except take 1/2 tablet on Mondays and Thursdays  Denies any bleeding events.   Review of Systems  Constitutional:  Positive for fatigue and unexpected weight change. Negative for appetite change, chills and fever.  HENT:   Negative for hearing loss and voice change.   Eyes:  Negative for eye problems.  Respiratory:  Negative for chest tightness, cough and shortness of breath.   Cardiovascular:  Positive for leg swelling. Negative for chest pain.  Gastrointestinal:  Negative for abdominal distention, abdominal pain, blood in stool and diarrhea.  Endocrine: Negative for hot flashes.  Genitourinary:   Negative for difficulty urinating and frequency.   Musculoskeletal:  Negative for arthralgias.  Skin:  Negative for itching.       Left butt blister is better.   Neurological:  Negative for extremity weakness.  Hematological:  Negative for adenopathy. Bruises/bleeds easily.  Psychiatric/Behavioral:  Negative for confusion.     MEDICAL HISTORY:  Past Medical History:  Diagnosis Date   Antiphospholipid antibody syndrome (HCC)    Arthritis    Cancer (HCC)    Diabetes mellitus without complication (HCC)    DVT (deep venous thrombosis) (HCC)    Lt leg   Dyspnea    Elevated lipids    Erythrocytosis    Hyperlipidemia    Hypertension    Lower extremity edema    Multinodular thyroid    Post-thrombotic syndrome     SURGICAL HISTORY: Past Surgical History:  Procedure Laterality Date   CARDIOVERSION N/A 03/22/2023   Procedure: CARDIOVERSION;  Surgeon: Antonieta Iba, MD;  Location: [facility] ORS;  Service: Cardiovascular;  Laterality: N/A;   COLONOSCOPY WITH PROPOFOL N/A 09/23/2018   Per Dr. Norma Fredrickson, polyp, repeat in 3 yrs    EYE MUSCLE SURGERY     JOINT REPLACEMENT Left 07/22/2017   PORTA CATH INSERTION N/A 12/24/2022   Procedure: PORTA CATH INSERTION;  Surgeon: Annice Needy, MD;  Location: [facility] INVASIVE CV LAB;  Service: Cardiovascular;  Laterality: N/A;   TOTAL HIP ARTHROPLASTY Left 07/22/2017   Procedure: TOTAL HIP ARTHROPLASTY;  Surgeon: Donato Heinz, MD;  Location: [facility] ORS;  Service: Orthopedics;  Laterality: Left;    SOCIAL HISTORY: Social History   Socioeconomic History   Marital status: Married    Spouse name: Not on file   Number of children: Not on file   Years of education: Not on file   Highest education level: Not on file  Occupational History   Not on file  Tobacco Use   Smoking status: Former    Current packs/day: 0.00    Average packs/day: 0.5 packs/day for 6.0 years (3.0 ttl pk-yrs)    Types: Cigarettes    Start date: 56    Quit date: 48    Years  since quitting: 42.7   Smokeless tobacco: Never   Tobacco comments:    Pt states when he did smoke he smoked at most 1 ppd. ALS 09/26/2022  Vaping Use   Vaping status: Never Used  Substance and Sexual Activity   Alcohol use: Not Currently    Comment: occ   Drug use: No   Sexual activity: Not on file  Other Topics Concern   Not on file  Social History Narrative   Not on file   Social Determinants of Health   Financial Resource Strain: Low Risk  (08/27/2022)   Overall Financial Resource Strain (CARDIA)    Difficulty of Paying Living Expenses: Not very hard  Food Insecurity: No Food Insecurity (02/18/2023)   Hunger Vital Sign    Worried About Running Out of Food in the Last Year: Never true    Ran Out of Food in the Last Year: Never true  Transportation Needs: No Transportation Needs (02/18/2023)   PRAPARE - Administrator, Civil Service (Medical): No    Lack of Transportation (Non-Medical): No  Physical Activity: Insufficiently Active (08/27/2022)   Exercise Vital Sign    Days of Exercise per Week: 3 days    Minutes of Exercise per Session: 30 min  Stress: Stress Concern Present (08/27/2022)   [employer] of Occupational Health - Occupational Stress Questionnaire    Feeling of Stress : Rather much  Social Connections: Socially Integrated (08/27/2022)   Social Connection and Isolation Panel [NHANES]    Frequency of Communication with Friends and Family: More than three times a week    Frequency of Social Gatherings with Friends and Family: More than three times a week    Attends Religious Services: 1 to 4 times per year    Active Member of [employer] or Organizations: Yes    Attends Banker Meetings: Never    Marital Status: Married  Catering manager Violence: Not At Risk (02/14/2023)   Humiliation, Afraid, Rape, and Kick questionnaire    Fear of Current or Ex-Partner: No    Emotionally Abused: No    Physically Abused: No    Sexually Abused: No     FAMILY HISTORY: Family History  Problem Relation Age of Onset   Breast cancer Mother    Emphysema Father    Colon cancer Maternal Grandmother     ALLERGIES:  has No Known Allergies.  MEDICATIONS:  Current Outpatient Medications  Medication Sig Dispense Refill   albuterol (VENTOLIN HFA) 108 (90 Base) MCG/ACT inhaler TAKE 2 PUFFS BY MOUTH EVERY 4 HOURS AS NEEDED 8.5 each 1   Calcium Carbonate 500 MG CHEW Chew 2 tablets (1,000 mg total) by mouth daily. 60 tablet 0   diphenoxylate-atropine (LOMOTIL) 2.5-0.025 MG tablet Take 1 tablet by mouth 4 (four) times daily as needed for diarrhea or loose stools. 120 tablet 3  fentaNYL (DURAGESIC) 50 MCG/HR Place 1 patch onto the skin every 3 (three) days. 10 patch 0   glipiZIDE (GLUCOTROL) 10 MG tablet Take 1 tablet (10 mg total) by mouth 2 (two) times daily before a meal. 180 tablet 3   glucose blood test strip 1 each by Other route as needed for other. accu chek aviva plusUse as instructed     ketoconazole (NIZORAL) 2 % cream Apply 1 Application topically 2 (two) times daily as needed for irritation. 60 g 5   Lactulose 20 GM/30ML SOLN Take 30 mLs (20 g total) by mouth daily as needed (constipation.). (Patient not taking: Reported on 07/08/2023) 450 mL 0   lidocaine-prilocaine (EMLA) cream Apply 1 Application topically as needed. 30 g 5   LORazepam (ATIVAN) 0.5 MG tablet TAKE 1 TABLET (0.5 MG TOTAL) BY MOUTH EVERY 8 (EIGHT) HOURS AS NEEDED FOR ANXIETY (NAUSEA VOMITING). 60 tablet 0   metFORMIN (GLUCOPHAGE) 500 MG tablet TAKE TWO TABLETS BY MOUTH EVERY MORNING WITH BREAKFAST 180 tablet 1   metoprolol tartrate (LOPRESSOR) 50 MG tablet Take 1 tablet (50 mg total) by mouth 2 (two) times daily. 180 tablet 3   MUCINEX D MAX STRENGTH [phone] MG TB12 Take 1,200 mg by mouth 2 (two) times daily as needed (for congestion or to loosen mucous in the chest).     ondansetron (ZOFRAN) 4 MG tablet Take by mouth.     oxyCODONE (OXY IR/ROXICODONE) 5 MG immediate  release tablet Take 1 tablet (5 mg total) by mouth every 4 (four) hours as needed for severe pain. 60 tablet 0   potassium chloride (KLOR-CON) 10 MEQ tablet Take 1 tablet (10 mEq total) by mouth daily. 90 tablet 3   pravastatin (PRAVACHOL) 20 MG tablet Take 1 tablet (20 mg total) by mouth daily. 90 tablet 3   prochlorperazine (COMPAZINE) 10 MG tablet Take 10 mg by mouth every 6 (six) hours as needed.     silver sulfADIAZINE (SILVADENE) 1 % cream Apply 1 Application topically daily. 50 g 0   SLOW-MAG 71.5-119 MG TBEC SR tablet TAKE 1 TABLET (64 MG TOTAL) BY MOUTH DAILY 60 tablet 1   tadalafil (CIALIS) 20 MG tablet Take 0.5-1 tablets (10-20 mg total) by mouth every other day as needed for erectile dysfunction. 20 tablet 11   torsemide (DEMADEX) 20 MG tablet Take 1 tablet (20 mg total) by mouth 2 (two) times daily as needed. (Patient not taking: Reported on 07/08/2023) 180 tablet 3   traZODone (DESYREL) 50 MG tablet TAKE 1 TABLET BY MOUTH AT BEDTIME AS NEEDED FOR SLEEP. 30 tablet 1   triamcinolone cream (KENALOG) 0.1 % Apply 1 Application topically 3 (three) times daily. 80 g 5   warfarin (COUMADIN) 5 MG tablet Take 1 tablet (5 mg total) by mouth daily. 30 tablet 5   No current facility-administered medications for this visit.     PHYSICAL EXAMINATION: ECOG PERFORMANCE STATUS: 1 - Symptomatic but completely ambulatory Vitals:   07/08/23 0942  BP: (!) 121/50  Pulse: (!) 56  Resp: 20  Temp: (!) 97.2 F (36.2 C)  SpO2: 99%   Filed Weights   07/08/23 0942  Weight: 232 lb (105.2 kg)    Physical Exam Constitutional:      General: He is not in acute distress. HENT:     Head: Normocephalic and atraumatic.  Eyes:     General: No scleral icterus. Cardiovascular:     Rate and Rhythm: Normal rate.  Pulmonary:     Effort: Pulmonary  effort is normal. No respiratory distress.     Breath sounds: No wheezing.  Abdominal:     General: Bowel sounds are normal. There is no distension.   Musculoskeletal:        General: Normal range of motion.     Cervical back: Normal range of motion and neck supple.     Right lower leg: Edema present.     Left lower leg: Edema present.  Skin:    General: Skin is warm and dry.     Findings: No erythema.  Neurological:     Mental Status: He is alert and oriented to person, place, and time. Mental status is at baseline.  Psychiatric:        Mood and Affect: Mood normal.     LABORATORY DATA:  I have reviewed the data as listed    Latest Ref Rng & Units 07/08/2023    9:32 AM 06/24/2023    9:12 AM 06/17/2023    8:43 AM  CBC  WBC 4.0 - 10.5 K/uL 2.5  2.7  4.2   Hemoglobin 13.0 - 17.0 g/dL 78.2  95.6  21.3   Hematocrit 39.0 - 52.0 % 34.8  34.9  34.8   Platelets 150 - 400 K/uL 100  169  116       Latest Ref Rng & Units 07/08/2023    9:32 AM 06/24/2023    9:12 AM 06/17/2023    8:43 AM  CMP  Glucose 70 - 99 mg/dL 94  88  57   BUN 8 - 23 mg/dL 10  9  9    Creatinine 0.61 - 1.24 mg/dL 0.86  5.78  4.69   Sodium 135 - 145 mmol/L 133  132  131   Potassium 3.5 - 5.1 mmol/L 3.2  3.9  3.9   Chloride 98 - 111 mmol/L 103  103  100   CO2 22 - 32 mmol/L 24  25  24    Calcium 8.9 - 10.3 mg/dL 8.4  8.6  8.6   Total Protein 6.5 - 8.1 g/dL 6.0  5.9  6.1   Total Bilirubin 0.3 - 1.2 mg/dL 0.9  0.8  0.9   Alkaline Phos 38 - 126 U/L 115  111  94   AST 15 - 41 U/L 39  35  38   ALT 0 - 44 U/L 24  20  19      RADIOGRAPHIC STUDIES: I have personally reviewed the radiological images as listed and agreed with the findings in the report. DG Chest 2 View  Result Date: 07/06/2023 CLINICAL DATA:  cough, green sputum EXAM: CHEST - 2 VIEW COMPARISON:  February 11, 2023, April 25, 2023 FINDINGS: The cardiomediastinal silhouette is unchanged in contour.RIGHT chest port with tip terminating over the superior cavoatrial junction. No pleural effusion. No pneumothorax. Similar LEFT basilar coarse reticular prominence, better evaluated on prior CT. No acute pleuroparenchymal  abnormality. Visualized abdomen is unremarkable. Multilevel degenerative changes of the thoracic spine. IMPRESSION: Similar LEFT basilar coarse reticular prominence, better evaluated on prior CT. No new focal airspace opacity. Electronically Signed   By: Meda Klinefelter M.D.   On: 07/06/2023 16:49   DG FEMUR, MIN 2 VIEWS RIGHT  Result Date: 06/25/2023 CLINICAL DATA:  Right femur pain EXAM: RIGHT FEMUR 2 VIEWS COMPARISON:  None Available. FINDINGS: Mixed lytic and sclerotic lesion within the right ischial tuberosity again noted, stable since prior CT compatible with metastasis. Moderate degenerative changes in the right hip with joint space narrowing and spurring. No  acute bony abnormality. Specifically, no fracture, subluxation, or dislocation. IMPRESSION: No acute bony abnormality. Stable right ischial tuberosity metastasis. Electronically Signed   By: Charlett Nose M.D.   On: 06/25/2023 07:19   DG HIP UNILAT W OR W/O PELVIS 2-3 VIEWS RIGHT  Result Date: 06/25/2023 CLINICAL DATA:  Right hip pain EXAM: DG HIP (WITH OR WITHOUT PELVIS) 2-3V RIGHT COMPARISON:  CT chest, abdomen and pelvis 04/25/2023. FINDINGS: Prior left hip replacement. Moderate degenerative changes in the right hip with joint space narrowing and spurring. No acute bony abnormality. Specifically, no fracture, subluxation, or dislocation. Continued mixed lytic and sclerotic lesion within the ischial tuberosities bilaterally compatible with metastases, stable since prior CT. IMPRESSION: No acute bony abnormality. Bilateral ischial metastases, unchanged since prior CT. Electronically Signed   By: Charlett Nose M.D.   On: 06/25/2023 07:19

## 2023-07-08 NOTE — Telephone Encounter (Signed)
Checked to make sure that CT was authorized and per [employer]: Per Laurence Spates Auth# [id number] valid 07/08/23-08/06/23 for CT c/a/p [id number] **[facility]**   CT appt was changed from [facility] to [facility] and I spoke with pt to let him know the appt details and that he would have to pick up the oral contrast from the [place] location as well

## 2023-07-08 NOTE — Assessment & Plan Note (Signed)
And cardiomyopathy, follow up with cardiology Rate controlled. S/p ablation on 03/26/23  

## 2023-07-08 NOTE — Assessment & Plan Note (Signed)
Follow up with physical therapy

## 2023-07-08 NOTE — Progress Notes (Signed)
Palliative Medicine [facility] at [facility] [phone] [phone]   Name: Christopher Marsh Date: 07/08/2023 MRN: [id number]  DOB: Feb 20, 1961  Patient Care Team: Nelwyn Salisbury, MD as PCP - General (Family Medicine) Mariah Milling Tollie Pizza, MD as PCP - Cardiology (Cardiology) [name], MD as Consulting Physician (Hematology and Oncology) Georgiana Spinner, NP as Nurse Practitioner (Vascular Surgery) Benita Gutter, RN as Oncology Nurse Navigator Carmina Miller, MD as Consulting Physician (Radiation Oncology)    REASON FOR CONSULTATION: Christopher Marsh is a 62 y.o. male with multiple medical problems including stage IV cholangiocarcinoma.  Patient has had disease progression on multiple previous lines of treatment.  He is referred to palliative care to address goals and manage ongoing symptoms.  SOCIAL HISTORY:     reports that he quit smoking about 42 years ago. His smoking use included cigarettes. He started smoking about 48 years ago. He has a 3 pack-year smoking history. He has never used smokeless tobacco. He reports that he does not currently use alcohol. He reports that he does not use drugs.  Patient is married and lives at home with his wife.  ADVANCE DIRECTIVES:    CODE STATUS:   PAST MEDICAL HISTORY: Past Medical History:  Diagnosis Date   Antiphospholipid antibody syndrome (HCC)    Arthritis    Cancer (HCC)    Diabetes mellitus without complication (HCC)    DVT (deep venous thrombosis) (HCC)    Lt leg   Dyspnea    Elevated lipids    Erythrocytosis    Hyperlipidemia    Hypertension    Lower extremity edema    Multinodular thyroid    Post-thrombotic syndrome     PAST SURGICAL HISTORY:  Past Surgical History:  Procedure Laterality Date   CARDIOVERSION N/A 03/22/2023   Procedure: CARDIOVERSION;  Surgeon: Antonieta Iba, MD;  Location: [facility] ORS;  Service: Cardiovascular;  Laterality: N/A;   COLONOSCOPY WITH  PROPOFOL N/A 09/23/2018   Per Dr. Norma Fredrickson, polyp, repeat in 3 yrs    EYE MUSCLE SURGERY     JOINT REPLACEMENT Left 07/22/2017   PORTA CATH INSERTION N/A 12/24/2022   Procedure: PORTA CATH INSERTION;  Surgeon: Annice Needy, MD;  Location: [facility] INVASIVE CV LAB;  Service: Cardiovascular;  Laterality: N/A;   TOTAL HIP ARTHROPLASTY Left 07/22/2017   Procedure: TOTAL HIP ARTHROPLASTY;  Surgeon: Donato Heinz, MD;  Location: [facility] ORS;  Service: Orthopedics;  Laterality: Left;    HEMATOLOGY/ONCOLOGY HISTORY:  Oncology History  Cholangiocarcinoma (HCC)  10/30/2021 Imaging   CT chest wo contrast 1. Heterogeneous bilateral ground-glass disease, multilobar but worst in the right middle lobe, lingula and lower lobes. There is progression of the abnormality since 10/12/2022. Though pneumonia could produce this appearance, given persistence and fluctuating appearance since October, consider alternative entities such as hypersensitivity pneumonitis or idiopathic interstitial pneumonia. 2. Slightly enlarged pulmonary trunk, possible arterial hypertension 3. Redemonstrated large hypodense right hepatic lobe mass.   08/18/2022 Imaging   CT chest w contrast showed 1. Multifocal airspace disease in both lungs, left side greater than right. Findings are most compatible with multifocal pneumonia. 2. Multiple new hepatic lesions with enlarging lymph nodes in the upper abdomen and chest. Findings are concerning for metastatic disease. Based on the multifocal pneumonia, hepatic abscesses would also be in the differential diagnosis. Recommend further characterization of the abdomen and pelvis with CT with IV contrast. 3. Enlargement of the main pulmonary artery could be associated with  pulmonary hypertension. 4. Coronary artery calcifications. 5. Multinodular goiter. Patient has known thyroid nodules and previous thyroid ultrasound.    08/18/2022 Imaging   CT abdomen pelvis w contrast  Interval development of 6.5 x  5.2 cm heterogeneously enhancing mass in right hepatic lobe. This is highly concerning for neoplasm or malignancy, and further evaluation with MRI is recommended.   Mildly enlarged periaortic adenopathy is noted, with the largest lymph node measuring 12 mm. Metastatic disease cannot be excluded. Mildly enlarged adenopathy is also noted in the gastrohepatic ligament, but this is unchanged compared to prior exam.   Stable bibasilar lung opacities are noted concerning for inflammation, atelectasis or possibly scarring.   Grossly stable multi-septated cystic lesion is seen involving upper pole of right kidney with peripheral calcifications compared to prior exam of 2019, most likely representing benign etiology.   Aortic Atherosclerosis    08/18/2022 - 08/21/2022 Hospital Admission   Admitted due to multifocal pneumonia, treated with Influenza panel negative, COVID-negative, HIV negative, Complete course of antibiotics with Zithromax, Vantin     08/19/2022 Imaging   MR abdomen w wo contrast  Marked caudate lobe hypertrophy, highly suspicious for hepatic cirrhosis.   Numerous hypervascular masses throughout the right hepatic lobe, highly suspicious for multifocal hepatocellular carcinoma. Recommend correlation with AFP and consider tissue sampling.   Mild abdominal lymphadenopathy, with differential diagnosis including metastatic disease and reactive lymphadenopathy in setting of cirrhosis.   Bilateral lower lobe infiltrates, as better demonstrated on recent CT.    08/19/2022 Tumor Marker   AFP 4.1   08/27/2022 Initial Diagnosis   Cholangiocarcinoma  -08/20/22 Liver mass biopsy showed poorly differentiated carcinoma Immunohistochemical stains show that the poorly differentiated carcinoma is positive for CK7 and MOC-31, suggestive of a poorly differentiated adenocarcinoma.  Immunohistochemical stains for CK20, CDX2, HepPar 1 and arginase are negative.  Immunostain for glypican-3 shows very  focal  likely nonspecific labeling.  This immunoprofile is nonspecific but can be compatible with a poorly differentiated primary cholangiocarcinoma in absence of any other lesions.   NGS showed BAP 1 mutation.  Case was presented at tumor board, I have also discussed with pathologist Dr.Rubinas,  IHC pattern is more consistent with adenocarcinoma, unlikely HCC [due to negative HepPar 1 and arginase], cholangiocarcinoma is favored, although this is a diagnosis of exclusion. Second opinion ar [facility]   08/27/2022 Cancer Staging   Staging form: Intrahepatic Bile Duct, AJCC 8th Edition - Clinical stage from 08/27/2022: Stage IV (cT2, cN1, cM1) - Signed by [name], MD on 09/13/2022 Stage prefix: Initial diagnosis   08/27/2022 Tumor Marker   CA 19.9  16 CEA 2.8   09/12/2022 Imaging   PET scan showed 1. Large right hepatic lobe mass is hypermetabolic and consistent with known cholangiocarcinoma. 2. Scattered borderline enlarged upper abdominal lymph nodes do not show any significant FDG uptake. 3. 4 hypermetabolic bone lesions consistent with metastatic disease. 4. Progressive diffuse airspace process in the left lung could be due to atypical pneumonia, pulmonary hemorrhage or left-sided aspiration. 5. Aortic atherosclerosis.  09/12/2022 I had a phone discussion with patient after his PET scan resulted.  Recommend systemic chemotherapy.  Patient would like to defer until his second opinion visit at [facility].   09/15/2022 Imaging   CT angio chest pulmonary embolism protocol showed 1. No evidence for pulmonary embolism. 2. Progression of bilateral multifocal patchy ground-glass and interstitial opacities, left greater than right. Findings are concerning for multifocal pneumonia. 3. Air-fluid levels in the left lower lobe bronchi worrisome for aspiration.  4. Mediastinal and hilar lymphadenopathy has increased in size and number when compared to the prior study, likely reactive.5. Cardiomegaly. 6.  Findings compatible with pulmonary artery hypertension.7. Stable right liver mass and complex right renal cystic lesion. 8. Stable enlargement of the left thyroid.   09/15/2022 - 09/17/2022 Hospital Admission   Patient was admitted due to shortness of breath, CT showed progression of bilateral multifocal patchy groundglass and interstitial opacities.  Left greater than right.  Concerning for multifocal pneumonia.  Patient was started on broad-spectrum antibiotics with significant improvement.  He was discharged on oral antibiotics.   09/19/2022 Miscellaneous   Patient went to [facility] for second opinion.  He was seen by Shirlee Latch, who felt that the presentation is consistent with metastatic cholangiocarcinoma, and agrees with the plan of cisplatin/gemcitabine/durvalumab..    10/11/2022 - 01/31/2023 Chemotherapy   Patient is on Treatment Plan : Cisplatin D1,8 + Gemcitabine + Durvalumab  D1,8 q21d x     10/12/2022 Imaging   CT chest w contrast 1. Bilateral airspace and ground-glass opacities have mildly decreased in amount and density when compared to the prior study. Findings are compatible with resolving multifocal pneumonia. Continued follow-up imaging recommended. 2. Stable enlarged heterogeneous left thyroid gland with 3.2 cm nodule. This can be further evaluated with dedicated ultrasound. 3. Stable hepatic and right renal lesions, incompletely evaluated.   Aortic Atherosclerosis (ICD10-I70.0).   12/11/2022 Imaging   PET showed  1. There is been mild increase in size of the tracer avid mass within right lobe of liver compatible with known cholangiocarcinoma. The degree of tracer uptake however is slightly decreased in the interval. 2. Multifocal tracer avid bone metastases are again noted. When compared with the previous exam there has been an mixed interval response to therapy. Specifically, there has been interval increase in size and FDG uptake associated with the right ischial tuberosity  lesion and there is a new lesion within the posterior left iliac bone. Decreased tracer uptake is associated with the left scapular lesion and there is stable tracer uptake associated with the right T1 transverse process lesion. 3. Persistent ground-glass and airspace densities within the left upper lobe and left lower lobe compatible with inflammatory or infectious process. Interval resolution of previous right lung ground-glass and airspace densities.    12/24/2022 Procedure   Medi port placement by Dr. Wyn Quaker   01/22/2023 Imaging   CT chest abdomen pelvis with contrast 1. Hypodense lesion of the right lobe of the liver, hepatic segment VII/VIII is slightly diminished in size, consistent with treatment response. 2. No evident change in additional smaller hypodense lesions scattered throughout the right lobe of the liver, these more difficult to see on prior noncontrast PET-CT. 3. Unchanged enlarged celiac axis, gastrohepatic ligament, and retroperitoneal lymph nodes. Unchanged prominent high right paratracheal lymph nodes. 4. Unchanged osseous metastases. 5. Slightly improved, although still extensive ground-glass airspace opacity primarily seen throughout the left lower lobe and lingula,nonspecific and infectious or inflammatory. 6. Coronary artery disease. 7. Emphysema.   02/11/2023 Imaging   Patient self decreased Lovenox dosage to 120mg  once daily.  Increased lower extremity swelling.   02/11/23 [place] bilateral lower extremity showed 1. Nonocclusive DVT within the popliteal veins bilaterally. 2. Occlusive DVT within the posterior tibial veins of the right calf.       02/13/2023 Imaging   CT chest Angiogram  1. Technically adequate exam showing no acute pulmonary embolus. 2. Cardiomegaly. Coronary artery calcifications. 3. Bilateral pleural effusions. 4. Improved aeration of the RIGHT UPPER  and LOWER lobes compared to prior study. 5. LEFT LOWER lobe and lingula airspace filling and  atelectasis, slightly improved compared to prior study. 6. Cirrhotic morphology of the liver. 7.7 centimeters mass in the RIGHT hepatic lobe. 7. Gastrohepatic ligament adenopathy. 8. A 7 millimeters sclerotic lesion in the UPPER sternum is suspicious for metastatic disease. 9. LEFT thyroid goiter.   02/13/2023 - 02/15/2023 Hospital Admission   A flutter with variable A-V block. Heart rate up to 140.  BNP 119.8 04/18: Echo no overt CHF - EF 50-55  Cardiology recs beta blocker to metoprolol succinate 50 mg bid, Torsemid 40mg  daily Adjusting Lovenox 14mg  BID.    03/03/2023 - 05/03/2023 Chemotherapy   Futibatinib 20mg , 03/12/23 Decreased to 16mg  daily Stopped in early July due to cancer progression.   04/24/2023 - 04/30/2023 Radiation Therapy   left ischium radiation.    05/20/2023 -  Chemotherapy   Patient is on Treatment Plan : COLORECTAL FOLFOX q14d       ALLERGIES:  has No Known Allergies.  MEDICATIONS:  Current Outpatient Medications  Medication Sig Dispense Refill   albuterol (VENTOLIN HFA) 108 (90 Base) MCG/ACT inhaler TAKE 2 PUFFS BY MOUTH EVERY 4 HOURS AS NEEDED 8.5 each 1   Calcium Carbonate 500 MG CHEW Chew 2 tablets (1,000 mg total) by mouth daily. 60 tablet 0   diphenoxylate-atropine (LOMOTIL) 2.5-0.025 MG tablet Take 1 tablet by mouth 4 (four) times daily as needed for diarrhea or loose stools. 120 tablet 3   fentaNYL (DURAGESIC) 50 MCG/HR Place 1 patch onto the skin every 3 (three) days. 10 patch 0   glipiZIDE (GLUCOTROL) 10 MG tablet Take 1 tablet (10 mg total) by mouth 2 (two) times daily before a meal. 180 tablet 3   glucose blood test strip 1 each by Other route as needed for other. accu chek aviva plusUse as instructed     ketoconazole (NIZORAL) 2 % cream Apply 1 Application topically 2 (two) times daily as needed for irritation. 60 g 5   lidocaine-prilocaine (EMLA) cream Apply 1 Application topically as needed. 30 g 5   LORazepam (ATIVAN) 0.5 MG tablet TAKE 1 TABLET (0.5  MG TOTAL) BY MOUTH EVERY 8 (EIGHT) HOURS AS NEEDED FOR ANXIETY (NAUSEA VOMITING). 60 tablet 0   magnesium chloride (SLOW-MAG) 64 MG TBEC SR tablet Take 1 tablet (64 mg total) by mouth 2 (two) times daily. 60 tablet 0   MUCINEX D MAX STRENGTH [phone] MG TB12 Take 1,200 mg by mouth 2 (two) times daily as needed (for congestion or to loosen mucous in the chest).     ondansetron (ZOFRAN) 4 MG tablet Take by mouth.     oxyCODONE (OXY IR/ROXICODONE) 5 MG immediate release tablet Take 1 tablet (5 mg total) by mouth every 4 (four) hours as needed for severe pain. 60 tablet 0   potassium chloride (KLOR-CON) 10 MEQ tablet Take 1 tablet (10 mEq total) by mouth daily. 90 tablet 3   pravastatin (PRAVACHOL) 20 MG tablet Take 1 tablet (20 mg total) by mouth daily. 90 tablet 3   prochlorperazine (COMPAZINE) 10 MG tablet Take 10 mg by mouth every 6 (six) hours as needed.     silver sulfADIAZINE (SILVADENE) 1 % cream Apply 1 Application topically daily. 50 g 0   tadalafil (CIALIS) 20 MG tablet Take 0.5-1 tablets (10-20 mg total) by mouth every other day as needed for erectile dysfunction. 20 tablet 11   traZODone (DESYREL) 50 MG tablet TAKE 1 TABLET BY MOUTH AT BEDTIME  AS NEEDED FOR SLEEP. 30 tablet 1   triamcinolone cream (KENALOG) 0.1 % Apply 1 Application topically 3 (three) times daily. 80 g 5   warfarin (COUMADIN) 5 MG tablet Take 1 tablet (5 mg total) by mouth daily. 30 tablet 5   Lactulose 20 GM/30ML SOLN Take 30 mLs (20 g total) by mouth daily as needed (constipation.). (Patient not taking: Reported on 07/08/2023) 450 mL 0   metFORMIN (GLUCOPHAGE) 500 MG tablet TAKE TWO TABLETS BY MOUTH EVERY MORNING WITH BREAKFAST 180 tablet 1   metoprolol tartrate (LOPRESSOR) 50 MG tablet Take 1 tablet (50 mg total) by mouth 2 (two) times daily. 180 tablet 3   torsemide (DEMADEX) 20 MG tablet Take 1 tablet (20 mg total) by mouth 2 (two) times daily as needed. (Patient not taking: Reported on 07/08/2023) 180 tablet 3   No  current facility-administered medications for this visit.   Facility-Administered Medications Ordered in Other Visits  Medication Dose Route Frequency Provider Last Rate Last Admin   heparin lock flush 100 unit/mL  500 Units Intravenous Once [name], MD       sodium chloride flush (NS) 0.9 % injection 10 mL  10 mL Intravenous Once [name], MD        VITAL SIGNS: BP (!) 121/50   Pulse (!) 56   Temp (!) 97.2 F (36.2 C) (Tympanic)   Resp 20   Ht 6\' 3"  (1.905 m)   Wt 232 lb (105.2 kg)   SpO2 99%   BMI 29.00 kg/m  Filed Weights   07/08/23 0942  Weight: 232 lb (105.2 kg)    Estimated body mass index is 29 kg/m as calculated from the following:   Height as of this encounter: 6\' 3"  (1.905 m).   Weight as of this encounter: 232 lb (105.2 kg).  LABS: CBC:    Component Value Date/Time   WBC 2.5 (L) 07/08/2023 0932   HGB 11.1 (L) 07/08/2023 0932   HGB 11.0 (L) 06/24/2023 0912   HCT 34.8 (L) 07/08/2023 0932   PLT 100 (L) 07/08/2023 0932   PLT 169 06/24/2023 0912   MCV 91.1 07/08/2023 0932   NEUTROABS 1.9 07/08/2023 0932   LYMPHSABS 0.2 (L) 07/08/2023 0932   MONOABS 0.4 07/08/2023 0932   EOSABS 0.0 07/08/2023 0932   BASOSABS 0.0 07/08/2023 0932   Comprehensive Metabolic Panel:    Component Value Date/Time   NA 133 (L) 07/08/2023 0932   K 3.2 (L) 07/08/2023 0932   CL 103 07/08/2023 0932   CO2 24 07/08/2023 0932   BUN 10 07/08/2023 0932   CREATININE 0.75 07/08/2023 0932   GLUCOSE 94 07/08/2023 0932   CALCIUM 8.4 (L) 07/08/2023 0932   AST 39 07/08/2023 0932   ALT 24 07/08/2023 0932   ALKPHOS 115 07/08/2023 0932   BILITOT 0.9 07/08/2023 0932   PROT 6.0 (L) 07/08/2023 0932   ALBUMIN 3.2 (L) 07/08/2023 0932    RADIOGRAPHIC STUDIES: DG Chest 2 View  Result Date: 07/06/2023 CLINICAL DATA:  cough, green sputum EXAM: CHEST - 2 VIEW COMPARISON:  February 11, 2023, April 25, 2023 FINDINGS: The cardiomediastinal silhouette is unchanged in contour.RIGHT chest port with tip  terminating over the superior cavoatrial junction. No pleural effusion. No pneumothorax. Similar LEFT basilar coarse reticular prominence, better evaluated on prior CT. No acute pleuroparenchymal abnormality. Visualized abdomen is unremarkable. Multilevel degenerative changes of the thoracic spine. IMPRESSION: Similar LEFT basilar coarse reticular prominence, better evaluated on prior CT. No new focal airspace opacity. Electronically Signed  By: Meda Klinefelter M.D.   On: 07/06/2023 16:49   DG FEMUR, MIN 2 VIEWS RIGHT  Result Date: 06/25/2023 CLINICAL DATA:  Right femur pain EXAM: RIGHT FEMUR 2 VIEWS COMPARISON:  None Available. FINDINGS: Mixed lytic and sclerotic lesion within the right ischial tuberosity again noted, stable since prior CT compatible with metastasis. Moderate degenerative changes in the right hip with joint space narrowing and spurring. No acute bony abnormality. Specifically, no fracture, subluxation, or dislocation. IMPRESSION: No acute bony abnormality. Stable right ischial tuberosity metastasis. Electronically Signed   By: Charlett Nose M.D.   On: 06/25/2023 07:19   DG HIP UNILAT W OR W/O PELVIS 2-3 VIEWS RIGHT  Result Date: 06/25/2023 CLINICAL DATA:  Right hip pain EXAM: DG HIP (WITH OR WITHOUT PELVIS) 2-3V RIGHT COMPARISON:  CT chest, abdomen and pelvis 04/25/2023. FINDINGS: Prior left hip replacement. Moderate degenerative changes in the right hip with joint space narrowing and spurring. No acute bony abnormality. Specifically, no fracture, subluxation, or dislocation. Continued mixed lytic and sclerotic lesion within the ischial tuberosities bilaterally compatible with metastases, stable since prior CT. IMPRESSION: No acute bony abnormality. Bilateral ischial metastases, unchanged since prior CT. Electronically Signed   By: Charlett Nose M.D.   On: 06/25/2023 07:19    PERFORMANCE STATUS (ECOG) : 1 - Symptomatic but completely ambulatory  Review of Systems Unless otherwise  noted, a complete review of systems is negative.  Physical Exam General: NAD Pulmonary: Unlabored Extremities: no edema, no joint deformities Skin: no rashes Neurological: Weakness but otherwise nonfocal  IMPRESSION: Patient with stage IV cholangiocarcinoma with disease progression on several lines of previous treatment.  He has been referred to [facility] for consideration of clinical trial.  Patient reports significant symptom burden with previous cancer treatments.  He has been most concerned about poor oral intake causing him to feel fatigued.  However, patient says that he has felt significantly improved now off treatment.  He is talking to [facility] about options for clinical trial and states that he is not "giving up."  However, he says that he is not sure that he would want further treatment if it negatively impacts his quality of life and only provides life prolongation for short duration of time.  We did briefly discuss the future involvement of hospice.  Today, patient symptomatically feels good.  He denies any acute complaints or concerns.  I sent patient home with a MOST form to review.  PLAN: -Continue current scope of treatment -MOST Form reviewed -RTC 2 weeks   Patient expressed understanding and was in agreement with this plan. He also understands that He can call the clinic at any time with any questions, concerns, or complaints.     Time Total: 20 minutes  Visit consisted of counseling and education dealing with the complex and emotionally intense issues of symptom management and palliative care in the setting of serious and potentially life-threatening illness.Greater than 50%  of this time was spent counseling and coordinating care related to the above assessment and plan.  Signed by: Laurette Schimke, PhD, NP-C

## 2023-07-08 NOTE — Assessment & Plan Note (Signed)
Recommend patient continue potassium chloride 20 meq BID

## 2023-07-08 NOTE — Progress Notes (Signed)
Pt had INR lab draw today at oncology infusion center.  Hold warfarin today and then change weekly dose to take 1 tablet daily except take 1/2 tablet on Monday, Wednesday and Friday. Recheck on 9/19 at oncology infusion center.   Contacted pt and advised of change in dosing and recheck date. Pt denies any abnormal bruising or bleeding. Pt denies any change that would cause supratherapeutic INR. Advised if any s/s of abnormal bruising or bleeding to go to the ER or call 911. Pt verbalized understanding. Sent msg to oncology requesting lab INR on 9/19.

## 2023-07-08 NOTE — Patient Instructions (Addendum)
Pre visit review using our clinic review tool, if applicable. No additional management support is needed unless otherwise documented below in the visit note.  Hold warfarin today and then change weekly dose to take 1 tablet daily except take 1/2 tablet on Monday, Wednesday and Friday. Recheck on 9/19 at oncology infusion center.

## 2023-07-08 NOTE — Group Note (Deleted)

## 2023-07-08 NOTE — Assessment & Plan Note (Signed)
S/p palliative  radiation  Consider bisphosphonate after dental clearance is obtained. S/p left ischium.  radiation due to concern of pathological fracture.  Right thigh pain, no RT per Radonc. Felt was due to arthritis.

## 2023-07-09 LAB — CEA: CEA: 11.5 ng/mL — ABNORMAL HIGH (ref 0.0–4.7)

## 2023-07-09 LAB — CANCER ANTIGEN 19-9: CA 19-9: 73 U/mL — ABNORMAL HIGH (ref 0–35)

## 2023-07-10 ENCOUNTER — Ambulatory Visit (INDEPENDENT_AMBULATORY_CARE_PROVIDER_SITE_OTHER): Payer: BC Managed Care – PPO | Admitting: Pulmonary Disease

## 2023-07-10 ENCOUNTER — Encounter: Payer: Self-pay | Admitting: Pulmonary Disease

## 2023-07-10 ENCOUNTER — Inpatient Hospital Stay: Payer: BC Managed Care – PPO

## 2023-07-10 VITALS — BP 106/58 | HR 60 | Temp 97.6°F | Ht 75.0 in | Wt 235.0 lb

## 2023-07-10 DIAGNOSIS — J189 Pneumonia, unspecified organism: Secondary | ICD-10-CM | POA: Diagnosis not present

## 2023-07-10 DIAGNOSIS — R051 Acute cough: Secondary | ICD-10-CM | POA: Diagnosis not present

## 2023-07-10 MED ORDER — AMOXICILLIN-POT CLAVULANATE 875-125 MG PO TABS: 1.0000 | ORAL_TABLET | Freq: Two times a day (BID) | ORAL | 0 refills | Status: AC

## 2023-07-10 NOTE — Progress Notes (Signed)
@Patient  ID: Christopher Marsh, male    DOB: 11-27-1960, 62 y.o.   MRN: [id number]  Chief Complaint  Patient presents with   Follow-up    Breathing is fair some congestion     Referring provider: [name], MD  HPI:   62 y.o. man whom are seen for follow-up follow-up for multifocal infiltrates on chest imaging, waxing and waning over time.  Multiple oncology notes reviewed.  Overall doing okay.  About last 3 weeks has developed a productive cough.  Green phlegm.  Not horrible but persistent.  Had a little bit of flushing, some concern for developed fever 3 weeks ago.  But never really manifested.  No significant worsening dyspnea etc.  Contacted our office but reply was not very helpful.  I was not informed.  Discussed antibiotic therapy.  He has had intermittent and waxing and waning infiltrates likely sequela possible aspiration etc.  Reliably improved with antibiotics.  HPI initial visit: Patient was admitted to the hospital 07/2022 with malaise, cough, shortness of breath.  Chest x-ray 07/19/2022 my review interpretation reveals bilateral left greater than right multifocal infiltrates.  When compared to 07/2021, left side appears slightly worse but similar, new infiltrates on the right on my review and interpretation.  Patient was admitted to the hospital 07/2022 with malaise, cough, shortness of breath.  This was preceded by a CT chest a couple days prior to my review interpretation reveals scattered nodular groundglass opacities most prominent in the left base but also in the lingula, mild in the right base.  Also revealed liver mass.  This was further work-up revealed metastatic cholangiocarcinoma.  He was treated with antibiotics.  His initial symptoms improved.  However 3 to 4 days later, similar symptoms recurred.  He was again admitted to the hospital 08/2022.  In the interim, he had a PET scan that showed worsening left greater than right groundglass opacities with worsening in the  right base on my review and interpretation.  Upon readmission to the hospital 08/2022 CTA PE protocol was obtained that showed no PE but worsening multifocal infiltrates and certainly worse in the right based on my review and interpretation.  Also developing mild bronchiectasis in the bases.  He was treated with IV antibiotics and completed additional 5 days of p.o. levofloxacin to complete 7-day course of antibiotics.  Again his symptoms have improved.  Breathing feels better.  He had mild hypoxemia upon admission.  No longer present.  He had a swallow evaluation in the hospital 08/2022, barium swallow that was normal.  Questionaires / Pulmonary Flowsheets:   ACT:      No data to display          MMRC:     No data to display          Epworth:      No data to display          Tests:   FENO:  No results found for: "NITRICOXIDE"  PFT:     No data to display          WALK:      No data to display          Imaging: Personally reviewed per EMR and discussion in this note DG Chest 2 View  Result Date: 07/06/2023 CLINICAL DATA:  cough, green sputum EXAM: CHEST - 2 VIEW COMPARISON:  February 11, 2023, April 25, 2023 FINDINGS: The cardiomediastinal silhouette is unchanged in contour.RIGHT chest port with tip terminating over the superior  cavoatrial junction. No pleural effusion. No pneumothorax. Similar LEFT basilar coarse reticular prominence, better evaluated on prior CT. No acute pleuroparenchymal abnormality. Visualized abdomen is unremarkable. Multilevel degenerative changes of the thoracic spine. IMPRESSION: Similar LEFT basilar coarse reticular prominence, better evaluated on prior CT. No new focal airspace opacity. Electronically Signed   By: Meda Klinefelter M.D.   On: 07/06/2023 16:49   DG FEMUR, MIN 2 VIEWS RIGHT  Result Date: 06/25/2023 CLINICAL DATA:  Right femur pain EXAM: RIGHT FEMUR 2 VIEWS COMPARISON:  None Available. FINDINGS: Mixed lytic and sclerotic  lesion within the right ischial tuberosity again noted, stable since prior CT compatible with metastasis. Moderate degenerative changes in the right hip with joint space narrowing and spurring. No acute bony abnormality. Specifically, no fracture, subluxation, or dislocation. IMPRESSION: No acute bony abnormality. Stable right ischial tuberosity metastasis. Electronically Signed   By: Charlett Nose M.D.   On: 06/25/2023 07:19   DG HIP UNILAT W OR W/O PELVIS 2-3 VIEWS RIGHT  Result Date: 06/25/2023 CLINICAL DATA:  Right hip pain EXAM: DG HIP (WITH OR WITHOUT PELVIS) 2-3V RIGHT COMPARISON:  CT chest, abdomen and pelvis 04/25/2023. FINDINGS: Prior left hip replacement. Moderate degenerative changes in the right hip with joint space narrowing and spurring. No acute bony abnormality. Specifically, no fracture, subluxation, or dislocation. Continued mixed lytic and sclerotic lesion within the ischial tuberosities bilaterally compatible with metastases, stable since prior CT. IMPRESSION: No acute bony abnormality. Bilateral ischial metastases, unchanged since prior CT. Electronically Signed   By: Charlett Nose M.D.   On: 06/25/2023 07:19    Lab Results: Personally reviewed CBC    Component Value Date/Time   WBC 2.5 (L) 07/08/2023 0932   RBC 3.82 (L) 07/08/2023 0932   HGB 11.1 (L) 07/08/2023 0932   HGB 11.0 (L) 06/24/2023 0912   HCT 34.8 (L) 07/08/2023 0932   PLT 100 (L) 07/08/2023 0932   PLT 169 06/24/2023 0912   MCV 91.1 07/08/2023 0932   MCH 29.1 07/08/2023 0932   MCHC 31.9 07/08/2023 0932   RDW 17.2 (H) 07/08/2023 0932   LYMPHSABS 0.2 (L) 07/08/2023 0932   MONOABS 0.4 07/08/2023 0932   EOSABS 0.0 07/08/2023 0932   BASOSABS 0.0 07/08/2023 0932    BMET    Component Value Date/Time   NA 133 (L) 07/08/2023 0932   K 3.2 (L) 07/08/2023 0932   CL 103 07/08/2023 0932   CO2 24 07/08/2023 0932   GLUCOSE 94 07/08/2023 0932   BUN 10 07/08/2023 0932   CREATININE 0.75 07/08/2023 0932   CALCIUM 8.4  (L) 07/08/2023 0932   GFRNONAA >60 07/08/2023 0932   GFRAA >60 07/24/2017 0439    BNP    Component Value Date/Time   BNP 119.8 (H) 02/13/2023 1101    ProBNP No results found for: "PROBNP"  Specialty Problems       Pulmonary Problems   Pneumonia   Multifocal pneumonia   Snoring   Lung infiltrate on CT   Rhinitis   Sore throat   SOB (shortness of breath)   Pleural effusion    No Known Allergies  Immunization History  Administered Date(s) Administered   DT (Pediatric) 03/22/2018   Influenza Whole 09/14/2009   Influenza, Quadrivalent, Recombinant, Inj, Pf 08/18/2019   Influenza, Seasonal, Injecte, Preservative Fre 07/08/2023   Influenza,inj,Quad PF,6+ Mos 08/28/2016, 07/23/2017, 10/20/2018, 08/08/2022   Influenza,inj,quad, With Preservative 10/29/2017   Influenza-Unspecified 10/12/2021   PFIZER(Purple Top)SARS-COV-2 Vaccination 12/31/2019   PNEUMOCOCCAL CONJUGATE-20 08/08/2022   Pneumococcal Conjugate-13 08/28/2016  Pneumococcal Polysaccharide-23 10/20/2018   Td 12/08/2008, 03/22/2018    Past Medical History:  Diagnosis Date   Antiphospholipid antibody syndrome (HCC)    Arthritis    Cancer (HCC)    Diabetes mellitus without complication (HCC)    DVT (deep venous thrombosis) (HCC)    Lt leg   Dyspnea    Elevated lipids    Erythrocytosis    Hyperlipidemia    Hypertension    Lower extremity edema    Multinodular thyroid    Post-thrombotic syndrome     Tobacco History: Social History   Tobacco Use  Smoking Status Former   Current packs/day: 0.00   Average packs/day: 0.5 packs/day for 6.0 years (3.0 ttl pk-yrs)   Types: Cigarettes   Start date: 21   Quit date: 1982   Years since quitting: 42.7  Smokeless Tobacco Never  Tobacco Comments   Pt states when he did smoke he smoked at most 1 ppd. ALS 09/26/2022   Counseling given: Not Answered Tobacco comments: Pt states when he did smoke he smoked at most 1 ppd. ALS 09/26/2022   Continue to not  smoke  Outpatient Encounter Medications as of 07/10/2023  Medication Sig   albuterol (VENTOLIN HFA) 108 (90 Base) MCG/ACT inhaler TAKE 2 PUFFS BY MOUTH EVERY 4 HOURS AS NEEDED   amoxicillin-clavulanate (AUGMENTIN) 875-125 MG tablet Take 1 tablet by mouth 2 (two) times daily for 10 days.   Calcium Carbonate 500 MG CHEW Chew 2 tablets (1,000 mg total) by mouth daily.   diphenoxylate-atropine (LOMOTIL) 2.5-0.025 MG tablet Take 1 tablet by mouth 4 (four) times daily as needed for diarrhea or loose stools.   fentaNYL (DURAGESIC) 50 MCG/HR Place 1 patch onto the skin every 3 (three) days.   glipiZIDE (GLUCOTROL) 10 MG tablet Take 1 tablet (10 mg total) by mouth 2 (two) times daily before a meal.   glucose blood test strip 1 each by Other route as needed for other. accu chek aviva plusUse as instructed   ketoconazole (NIZORAL) 2 % cream Apply 1 Application topically 2 (two) times daily as needed for irritation.   Lactulose 20 GM/30ML SOLN Take 30 mLs (20 g total) by mouth daily as needed (constipation.).   lidocaine-prilocaine (EMLA) cream Apply 1 Application topically as needed.   LORazepam (ATIVAN) 0.5 MG tablet TAKE 1 TABLET (0.5 MG TOTAL) BY MOUTH EVERY 8 (EIGHT) HOURS AS NEEDED FOR ANXIETY (NAUSEA VOMITING).   metFORMIN (GLUCOPHAGE) 500 MG tablet TAKE TWO TABLETS BY MOUTH EVERY MORNING WITH BREAKFAST   metoprolol tartrate (LOPRESSOR) 50 MG tablet Take 1 tablet (50 mg total) by mouth 2 (two) times daily.   MUCINEX D MAX STRENGTH 7548257599 MG TB12 Take 1,200 mg by mouth 2 (two) times daily as needed (for congestion or to loosen mucous in the chest).   ondansetron (ZOFRAN) 4 MG tablet Take by mouth.   oxyCODONE (OXY IR/ROXICODONE) 5 MG immediate release tablet Take 1 tablet (5 mg total) by mouth every 4 (four) hours as needed for severe pain.   pravastatin (PRAVACHOL) 20 MG tablet Take 1 tablet (20 mg total) by mouth daily.   prochlorperazine (COMPAZINE) 10 MG tablet Take 10 mg by mouth every 6 (six)  hours as needed.   silver sulfADIAZINE (SILVADENE) 1 % cream Apply 1 Application topically daily.   SLOW-MAG 71.5-119 MG TBEC SR tablet TAKE 1 TABLET (64 MG TOTAL) BY MOUTH DAILY   tadalafil (CIALIS) 20 MG tablet Take 0.5-1 tablets (10-20 mg total) by mouth every other day as  needed for erectile dysfunction.   traZODone (DESYREL) 50 MG tablet TAKE 1 TABLET BY MOUTH AT BEDTIME AS NEEDED FOR SLEEP.   triamcinolone cream (KENALOG) 0.1 % Apply 1 Application topically 3 (three) times daily.   warfarin (COUMADIN) 5 MG tablet Take 1 tablet (5 mg total) by mouth daily.   potassium chloride (KLOR-CON) 10 MEQ tablet Take 1 tablet (10 mEq total) by mouth daily.   torsemide (DEMADEX) 20 MG tablet Take 1 tablet (20 mg total) by mouth 2 (two) times daily as needed. (Patient not taking: Reported on 07/08/2023)   No facility-administered encounter medications on file as of 07/10/2023.     Review of Systems  Review of Systems  N/a Physical Exam  BP (!) 106/58 (BP Location: Left Arm, Patient Position: Sitting, Cuff Size: Normal)   Pulse 60   Temp 97.6 F (36.4 C) (Temporal)   Ht 6\' 3"  (1.905 m)   Wt 235 lb (106.6 kg)   SpO2 100%   BMI 29.37 kg/m   Wt Readings from Last 5 Encounters:  07/10/23 235 lb (106.6 kg)  07/08/23 232 lb (105.2 kg)  07/08/23 232 lb (105.2 kg)  06/24/23 246 lb (111.6 kg)  06/17/23 249 lb 6.4 oz (113.1 kg)    BMI Readings from Last 5 Encounters:  07/10/23 29.37 kg/m  07/08/23 29.00 kg/m  07/08/23 29.00 kg/m  06/24/23 30.75 kg/m  06/17/23 31.17 kg/m     Physical Exam General: Sitting in chair, no acute distress Eyes: EOMI, no icterus Neck: Supple, no JVP Pulmonary: Good air movement, clear, normal work of breathing Cardiovascular: Warm, chronic venous stasis changes in lower extremities Abdomen: Nondistended, bowel sounds present MSK: No synovitis, no effusion Neuro: Normal gait, no weakness Psych: Normal mood, full affect  Assessment & Plan:    Multifocal infiltrates: Present on the left on chest x-ray 07/2021.  Left greater than right particular lower lobes are present in the lingula as well 07/2022.  Worsened in density and worsening right lower lobe on CT images of PET scan 09/12/2022.  Interval worsening in the right lower lobe 09/15/2022.  Diffuse nearly nodular but areas of confluence groundglass opacities.  Given persistence do wonder about a recurrent process such as aspiration.  However, given metastatic cholangiocarcinoma, it is possible this represents lymphangitic spread.  Not necessarily consistent with images in 2022.  Pulmonary edema possible but prior improvement with antibiotics argues against this.  Noninfectious pneumonia, organizing pneumonia also possible. \  Acute cough: Green phlegm.  No dyspnea.  Lung exam clear.  Some mild bronchitis.  In the past and got better with antibiotic.  Augmentin twice daily for 10 days prescribed today.   Return in about 3 months (around 10/09/2023) for f/u Dr. Judeth Horn.   Karren Burly, MD 07/10/2023

## 2023-07-11 ENCOUNTER — Ambulatory Visit: Payer: BC Managed Care – PPO

## 2023-07-12 ENCOUNTER — Other Ambulatory Visit: Payer: Self-pay

## 2023-07-16 ENCOUNTER — Encounter: Payer: Self-pay | Admitting: Oncology

## 2023-07-16 ENCOUNTER — Other Ambulatory Visit: Payer: Self-pay

## 2023-07-16 ENCOUNTER — Ambulatory Visit: Payer: BC Managed Care – PPO

## 2023-07-16 ENCOUNTER — Ambulatory Visit
Admission: RE | Admit: 2023-07-16 | Discharge: 2023-07-16 | Disposition: A | Discharge status: Home / Self Care | Payer: BC Managed Care – PPO | Source: Ambulatory Visit | Attending: Oncology | Admitting: Oncology

## 2023-07-16 ENCOUNTER — Ambulatory Visit: Payer: BC Managed Care – PPO | Admitting: Oncology

## 2023-07-16 ENCOUNTER — Other Ambulatory Visit: Payer: BC Managed Care – PPO

## 2023-07-16 DIAGNOSIS — I700 Atherosclerosis of aorta: Secondary | ICD-10-CM | POA: Diagnosis not present

## 2023-07-16 DIAGNOSIS — C221 Intrahepatic bile duct carcinoma: Secondary | ICD-10-CM | POA: Diagnosis not present

## 2023-07-16 MED ORDER — HEPARIN SOD (PORK) LOCK FLUSH 100 UNIT/ML IV SOLN: 500.0000 [IU] | Freq: Once | INTRAVENOUS | Status: DC

## 2023-07-16 MED ORDER — SODIUM CHLORIDE 0.9% FLUSH
10.0000 mL | INTRAVENOUS | Status: DC | PRN
  Administered 2023-07-16: 10 mL via INTRAVENOUS

## 2023-07-16 MED ORDER — IOPAMIDOL (ISOVUE-300) INJECTION 61%
100.0000 mL | Freq: Once | INTRAVENOUS | Status: AC | PRN
  Administered 2023-07-16: 100 mL via INTRAVENOUS

## 2023-07-16 NOTE — Progress Notes (Signed)
Pt. Port accessed and heparin locked deaccessed by Hezzie Bump RN for CT Abd/Pel [facility]

## 2023-07-18 ENCOUNTER — Inpatient Hospital Stay: Payer: BC Managed Care – PPO

## 2023-07-18 ENCOUNTER — Ambulatory Visit (INDEPENDENT_AMBULATORY_CARE_PROVIDER_SITE_OTHER): Payer: BC Managed Care – PPO

## 2023-07-18 ENCOUNTER — Encounter: Payer: Self-pay | Admitting: Oncology

## 2023-07-18 ENCOUNTER — Ambulatory Visit
Admission: RE | Admit: 2023-07-18 | Discharge: 2023-07-18 | Disposition: A | Discharge status: Home / Self Care | Payer: BC Managed Care – PPO | Source: Ambulatory Visit | Attending: Oncology | Admitting: Oncology

## 2023-07-18 ENCOUNTER — Other Ambulatory Visit: Payer: Self-pay | Admitting: *Deleted

## 2023-07-18 ENCOUNTER — Inpatient Hospital Stay (HOSPITAL_BASED_OUTPATIENT_CLINIC_OR_DEPARTMENT_OTHER): Payer: BC Managed Care – PPO | Admitting: Oncology

## 2023-07-18 ENCOUNTER — Other Ambulatory Visit: Payer: Self-pay

## 2023-07-18 ENCOUNTER — Inpatient Hospital Stay (HOSPITAL_BASED_OUTPATIENT_CLINIC_OR_DEPARTMENT_OTHER): Payer: BC Managed Care – PPO | Admitting: Hospice and Palliative Medicine

## 2023-07-18 VITALS — BP 126/67 | HR 60 | Temp 97.7°F | Resp 18 | Wt 238.8 lb

## 2023-07-18 DIAGNOSIS — E876 Hypokalemia: Secondary | ICD-10-CM

## 2023-07-18 DIAGNOSIS — R634 Abnormal weight loss: Secondary | ICD-10-CM | POA: Diagnosis not present

## 2023-07-18 DIAGNOSIS — I480 Paroxysmal atrial fibrillation: Secondary | ICD-10-CM

## 2023-07-18 DIAGNOSIS — R630 Anorexia: Secondary | ICD-10-CM | POA: Diagnosis not present

## 2023-07-18 DIAGNOSIS — R9389 Abnormal findings on diagnostic imaging of other specified body structures: Secondary | ICD-10-CM | POA: Insufficient documentation

## 2023-07-18 DIAGNOSIS — M48061 Spinal stenosis, lumbar region without neurogenic claudication: Secondary | ICD-10-CM | POA: Diagnosis not present

## 2023-07-18 DIAGNOSIS — D6861 Antiphospholipid syndrome: Secondary | ICD-10-CM

## 2023-07-18 DIAGNOSIS — R29898 Other symptoms and signs involving the musculoskeletal system: Secondary | ICD-10-CM

## 2023-07-18 DIAGNOSIS — C23 Malignant neoplasm of gallbladder: Secondary | ICD-10-CM | POA: Diagnosis not present

## 2023-07-18 DIAGNOSIS — I4891 Unspecified atrial fibrillation: Secondary | ICD-10-CM | POA: Diagnosis not present

## 2023-07-18 DIAGNOSIS — Z7901 Long term (current) use of anticoagulants: Secondary | ICD-10-CM

## 2023-07-18 DIAGNOSIS — Z23 Encounter for immunization: Secondary | ICD-10-CM | POA: Diagnosis not present

## 2023-07-18 DIAGNOSIS — C7951 Secondary malignant neoplasm of bone: Secondary | ICD-10-CM

## 2023-07-18 DIAGNOSIS — Z5111 Encounter for antineoplastic chemotherapy: Secondary | ICD-10-CM | POA: Diagnosis not present

## 2023-07-18 DIAGNOSIS — C221 Intrahepatic bile duct carcinoma: Secondary | ICD-10-CM

## 2023-07-18 DIAGNOSIS — R296 Repeated falls: Secondary | ICD-10-CM

## 2023-07-18 DIAGNOSIS — Z515 Encounter for palliative care: Secondary | ICD-10-CM

## 2023-07-18 DIAGNOSIS — M5136 Other intervertebral disc degeneration, lumbar region: Secondary | ICD-10-CM | POA: Diagnosis not present

## 2023-07-18 DIAGNOSIS — M47814 Spondylosis without myelopathy or radiculopathy, thoracic region: Secondary | ICD-10-CM | POA: Diagnosis not present

## 2023-07-18 DIAGNOSIS — Z79899 Other long term (current) drug therapy: Secondary | ICD-10-CM | POA: Diagnosis not present

## 2023-07-18 DIAGNOSIS — I429 Cardiomyopathy, unspecified: Secondary | ICD-10-CM | POA: Diagnosis not present

## 2023-07-18 LAB — CBC WITH DIFFERENTIAL (CANCER CENTER ONLY)
Abs Immature Granulocytes: 0.02 10*3/uL (ref 0.00–0.07)
Basophils Absolute: 0 10*3/uL (ref 0.0–0.1)
Basophils Relative: 1 %
Eosinophils Absolute: 0 10*3/uL (ref 0.0–0.5)
Eosinophils Relative: 0 %
HCT: 33.8 % — ABNORMAL LOW (ref 39.0–52.0)
Hemoglobin: 10.7 g/dL — ABNORMAL LOW (ref 13.0–17.0)
Immature Granulocytes: 1 %
Lymphocytes Relative: 6 %
Lymphs Abs: 0.3 10*3/uL — ABNORMAL LOW (ref 0.7–4.0)
MCH: 29.6 pg (ref 26.0–34.0)
MCHC: 31.7 g/dL (ref 30.0–36.0)
MCV: 93.6 fL (ref 80.0–100.0)
Monocytes Absolute: 0.6 10*3/uL (ref 0.1–1.0)
Monocytes Relative: 16 %
Neutro Abs: 3.1 10*3/uL (ref 1.7–7.7)
Neutrophils Relative %: 76 %
Platelet Count: 125 10*3/uL — ABNORMAL LOW (ref 150–400)
RBC: 3.61 MIL/uL — ABNORMAL LOW (ref 4.22–5.81)
RDW: 16.9 % — ABNORMAL HIGH (ref 11.5–15.5)
WBC Count: 4 10*3/uL (ref 4.0–10.5)
nRBC: 0 % (ref 0.0–0.2)

## 2023-07-18 LAB — PROTIME-INR
INR: 1.9 — ABNORMAL HIGH (ref 0.8–1.2)
Prothrombin Time: 22.4 seconds — ABNORMAL HIGH (ref 11.4–15.2)

## 2023-07-18 LAB — CMP (CANCER CENTER ONLY)
ALT: 18 U/L (ref 0–44)
AST: 33 U/L (ref 15–41)
Albumin: 2.9 g/dL — ABNORMAL LOW (ref 3.5–5.0)
Alkaline Phosphatase: 117 U/L (ref 38–126)
Anion gap: 9 (ref 5–15)
BUN: 7 mg/dL — ABNORMAL LOW (ref 8–23)
CO2: 25 mmol/L (ref 22–32)
Calcium: 8.9 mg/dL (ref 8.9–10.3)
Chloride: 100 mmol/L (ref 98–111)
Creatinine: 0.78 mg/dL (ref 0.61–1.24)
GFR, Estimated: >60 mL/min (ref >60)
Glucose, Bld: 138 mg/dL — ABNORMAL HIGH (ref 70–99)
Potassium: 3.9 mmol/L (ref 3.5–5.1)
Sodium: 134 mmol/L — ABNORMAL LOW (ref 135–145)
Total Bilirubin: 1.1 mg/dL (ref 0.3–1.2)
Total Protein: 5.7 g/dL — ABNORMAL LOW (ref 6.5–8.1)

## 2023-07-18 MED ORDER — HEPARIN SOD (PORK) LOCK FLUSH 100 UNIT/ML IV SOLN
500.0000 [IU] | Freq: Once | INTRAVENOUS | Status: AC
  Administered 2023-07-18: 500 [IU] via INTRAVENOUS
  Filled 2023-07-18: qty 5

## 2023-07-18 MED ORDER — GADOBUTROL 1 MMOL/ML IV SOLN
10.0000 mL/kg | Freq: Once | INTRAVENOUS | Status: AC | PRN
  Administered 2023-07-18: 10 mL via INTRAVENOUS

## 2023-07-18 MED ORDER — HEPARIN SOD (PORK) LOCK FLUSH 100 UNIT/ML IV SOLN
INTRAVENOUS | Status: AC
  Filled 2023-07-18: qty 5

## 2023-07-18 MED ORDER — SODIUM CHLORIDE 0.9% FLUSH
10.0000 mL | INTRAVENOUS | Status: AC | PRN
  Administered 2023-07-18: 10 mL via INTRAVENOUS
  Filled 2023-07-18: qty 10

## 2023-07-18 MED ORDER — FENTANYL 50 MCG/HR TD PT72: 1.0000 | MEDICATED_PATCH | TRANSDERMAL | 0 refills | Status: DC

## 2023-07-18 NOTE — Assessment & Plan Note (Signed)
#  Recurrent lower extremity DVT, secondary to antiphospholipid syndrome.-. Need  life time anticoagulation Currently on coumadin with INR goal of 2-3.   INR improved, 1.9.  INR result will be sent to PCP's office for coumadin dosing.

## 2023-07-18 NOTE — Assessment & Plan Note (Signed)
Recommend patient continue potassium chloride 20 meq BID

## 2023-07-18 NOTE — Assessment & Plan Note (Addendum)
S/p palliative  radiation  Consider bisphosphonate after dental clearance is obtained. S/p left ischium.  radiation due to concern of pathological fracture.  Right thigh pain, no RT per Radonc. Felt was due to arthritis.  CT showed worse of bone metastatic disease, T12 epidural tumor extension- check MRI thoracic and lumbar spine  Will discuss with Radonc to radiate T12

## 2023-07-18 NOTE — Progress Notes (Signed)
Palliative Medicine [facility] [phone] Fax:([phone]   Name: Christopher Marsh Date: 07/18/2023 MRN: [id number]  DOB: [birth year]  Patient Care Team: Nelwyn Salisbury, MD as PCP - General (Family Medicine) Mariah Milling Tollie Pizza, MD as PCP - Cardiology (Cardiology) [name], MD as Consulting Physician (Hematology and Oncology) Georgiana Spinner, NP as Nurse Practitioner (Vascular Surgery) Benita Gutter, RN as Oncology Nurse Navigator Carmina Miller, MD as Consulting Physician (Radiation Oncology)    REASON FOR CONSULTATION: Christopher Marsh is a 62 y.o. male with multiple medical problems including stage IV cholangiocarcinoma.  Patient has had disease progression on multiple previous lines of treatment.  He is referred to palliative care to address goals and manage ongoing symptoms.  SOCIAL HISTORY:     reports that he quit smoking about 42 years ago. His smoking use included cigarettes. He started smoking about 48 years ago. He has a 3 pack-year smoking history. He has never used smokeless tobacco. He reports that he does not currently use alcohol. He reports that he does not use drugs.  Patient is married and lives at home with his wife.  ADVANCE DIRECTIVES:    CODE STATUS:   PAST MEDICAL HISTORY: Past Medical History:  Diagnosis Date   Antiphospholipid antibody syndrome (HCC)    Arthritis    Cancer (HCC)    Diabetes mellitus without complication (HCC)    DVT (deep venous thrombosis) (HCC)    Lt leg   Dyspnea    Elevated lipids    Erythrocytosis    Hyperlipidemia    Hypertension    Lower extremity edema    Multinodular thyroid    Post-thrombotic syndrome     PAST SURGICAL HISTORY:  Past Surgical History:  Procedure Laterality Date   CARDIOVERSION N/A 03/22/2023   Procedure: CARDIOVERSION;  Surgeon: Antonieta Iba, MD;  Location: [facility] ORS;  Service: Cardiovascular;  Laterality: N/A;   COLONOSCOPY WITH  PROPOFOL N/A 09/23/2018   Per Dr. Norma Fredrickson, polyp, repeat in 3 yrs    EYE MUSCLE SURGERY     JOINT REPLACEMENT Left 07/22/2017   PORTA CATH INSERTION N/A 12/24/2022   Procedure: PORTA CATH INSERTION;  Surgeon: Annice Needy, MD;  Location: [facility] INVASIVE CV LAB;  Service: Cardiovascular;  Laterality: N/A;   TOTAL HIP ARTHROPLASTY Left 07/22/2017   Procedure: TOTAL HIP ARTHROPLASTY;  Surgeon: Donato Heinz, MD;  Location: [facility] ORS;  Service: Orthopedics;  Laterality: Left;    HEMATOLOGY/ONCOLOGY HISTORY:  Oncology History  Cholangiocarcinoma (HCC)  10/30/2021 Imaging   CT chest wo contrast 1. Heterogeneous bilateral ground-glass disease, multilobar but worst in the right middle lobe, lingula and lower lobes. There is progression of the abnormality since 10/12/2022. Though pneumonia could produce this appearance, given persistence and fluctuating appearance since October, consider alternative entities such as hypersensitivity pneumonitis or idiopathic interstitial pneumonia. 2. Slightly enlarged pulmonary trunk, possible arterial hypertension 3. Redemonstrated large hypodense right hepatic lobe mass.   08/18/2022 Imaging   CT chest w contrast showed 1. Multifocal airspace disease in both lungs, left side greater than right. Findings are most compatible with multifocal pneumonia. 2. Multiple new hepatic lesions with enlarging lymph nodes in the upper abdomen and chest. Findings are concerning for metastatic disease. Based on the multifocal pneumonia, hepatic abscesses would also be in the differential diagnosis. Recommend further characterization of the abdomen and pelvis with CT with IV contrast. 3. Enlargement of the main pulmonary artery could be associated with  pulmonary hypertension. 4. Coronary artery calcifications. 5. Multinodular goiter. Patient has known thyroid nodules and previous thyroid ultrasound.    08/18/2022 Imaging   CT abdomen pelvis w contrast  Interval development of 6.5 x  5.2 cm heterogeneously enhancing mass in right hepatic lobe. This is highly concerning for neoplasm or malignancy, and further evaluation with MRI is recommended.   Mildly enlarged periaortic adenopathy is noted, with the largest lymph node measuring 12 mm. Metastatic disease cannot be excluded. Mildly enlarged adenopathy is also noted in the gastrohepatic ligament, but this is unchanged compared to prior exam.   Stable bibasilar lung opacities are noted concerning for inflammation, atelectasis or possibly scarring.   Grossly stable multi-septated cystic lesion is seen involving upper pole of right kidney with peripheral calcifications compared to prior exam of 2019, most likely representing benign etiology.   Aortic Atherosclerosis    08/18/2022 - 08/21/2022 Hospital Admission   Admitted due to multifocal pneumonia, treated with Influenza panel negative, COVID-negative, HIV negative, Complete course of antibiotics with Zithromax, Vantin     08/19/2022 Imaging   MR abdomen w wo contrast  Marked caudate lobe hypertrophy, highly suspicious for hepatic cirrhosis.   Numerous hypervascular masses throughout the right hepatic lobe, highly suspicious for multifocal hepatocellular carcinoma. Recommend correlation with AFP and consider tissue sampling.   Mild abdominal lymphadenopathy, with differential diagnosis including metastatic disease and reactive lymphadenopathy in setting of cirrhosis.   Bilateral lower lobe infiltrates, as better demonstrated on recent CT.    08/19/2022 Tumor Marker   AFP 4.1   08/27/2022 Initial Diagnosis   Cholangiocarcinoma  -08/20/22 Liver mass biopsy showed poorly differentiated carcinoma Immunohistochemical stains show that the poorly differentiated carcinoma is positive for CK7 and MOC-31, suggestive of a poorly differentiated adenocarcinoma.  Immunohistochemical stains for CK20, CDX2, HepPar 1 and arginase are negative.  Immunostain for glypican-3 shows very  focal  likely nonspecific labeling.  This immunoprofile is nonspecific but can be compatible with a poorly differentiated primary cholangiocarcinoma in absence of any other lesions.   NGS showed BAP 1 mutation.  Case was presented at tumor board, I have also discussed with pathologist Dr.Rubinas,  IHC pattern is more consistent with adenocarcinoma, unlikely HCC [due to negative HepPar 1 and arginase], cholangiocarcinoma is favored, although this is a diagnosis of exclusion. Second opinion ar [facility]   08/27/2022 Cancer Staging   Staging form: Intrahepatic Bile Duct, AJCC 8th Edition - Clinical stage from 08/27/2022: Stage IV (cT2, cN1, cM1) - Signed by [name], MD on 09/13/2022 Stage prefix: Initial diagnosis   08/27/2022 Tumor Marker   CA 19.9  16 CEA 2.8   09/12/2022 Imaging   PET scan showed 1. Large right hepatic lobe mass is hypermetabolic and consistent with known cholangiocarcinoma. 2. Scattered borderline enlarged upper abdominal lymph nodes do not show any significant FDG uptake. 3. 4 hypermetabolic bone lesions consistent with metastatic disease. 4. Progressive diffuse airspace process in the left lung could be due to atypical pneumonia, pulmonary hemorrhage or left-sided aspiration. 5. Aortic atherosclerosis.  09/12/2022 I had a phone discussion with patient after his PET scan resulted.  Recommend systemic chemotherapy.  Patient would like to defer until his second opinion visit at [facility]   09/15/2022 Imaging   CT angio chest pulmonary embolism protocol showed 1. No evidence for pulmonary embolism. 2. Progression of bilateral multifocal patchy ground-glass and interstitial opacities, left greater than right. Findings are concerning for multifocal pneumonia. 3. Air-fluid levels in the left lower lobe bronchi worrisome for aspiration.  4. Mediastinal and hilar lymphadenopathy has increased in size and number when compared to the prior study, likely reactive.5. Cardiomegaly. 6.  Findings compatible with pulmonary artery hypertension.7. Stable right liver mass and complex right renal cystic lesion. 8. Stable enlargement of the left thyroid.   09/15/2022 - 09/17/2022 Hospital Admission   Patient was admitted due to shortness of breath, CT showed progression of bilateral multifocal patchy groundglass and interstitial opacities.  Left greater than right.  Concerning for multifocal pneumonia.  Patient was started on broad-spectrum antibiotics with significant improvement.  He was discharged on oral antibiotics.   09/19/2022 Miscellaneous   Patient went to [facility] for second opinion.  He was seen by Shirlee Latch, who felt that the presentation is consistent with metastatic cholangiocarcinoma, and agrees with the plan of cisplatin/gemcitabine/durvalumab..    10/11/2022 - 01/31/2023 Chemotherapy   Patient is on Treatment Plan : Cisplatin D1,8 + Gemcitabine + Durvalumab  D1,8 q21d x     10/12/2022 Imaging   CT chest w contrast 1. Bilateral airspace and ground-glass opacities have mildly decreased in amount and density when compared to the prior study. Findings are compatible with resolving multifocal pneumonia. Continued follow-up imaging recommended. 2. Stable enlarged heterogeneous left thyroid gland with 3.2 cm nodule. This can be further evaluated with dedicated ultrasound. 3. Stable hepatic and right renal lesions, incompletely evaluated.   Aortic Atherosclerosis (ICD10-I70.0).   12/11/2022 Imaging   PET showed  1. There is been mild increase in size of the tracer avid mass within right lobe of liver compatible with known cholangiocarcinoma. The degree of tracer uptake however is slightly decreased in the interval. 2. Multifocal tracer avid bone metastases are again noted. When compared with the previous exam there has been an mixed interval response to therapy. Specifically, there has been interval increase in size and FDG uptake associated with the right ischial tuberosity  lesion and there is a new lesion within the posterior left iliac bone. Decreased tracer uptake is associated with the left scapular lesion and there is stable tracer uptake associated with the right T1 transverse process lesion. 3. Persistent ground-glass and airspace densities within the left upper lobe and left lower lobe compatible with inflammatory or infectious process. Interval resolution of previous right lung ground-glass and airspace densities.    12/24/2022 Procedure   Medi port placement by Dr. Wyn Quaker   01/22/2023 Imaging   CT chest abdomen pelvis with contrast 1. Hypodense lesion of the right lobe of the liver, hepatic segment VII/VIII is slightly diminished in size, consistent with treatment response. 2. No evident change in additional smaller hypodense lesions scattered throughout the right lobe of the liver, these more difficult to see on prior noncontrast PET-CT. 3. Unchanged enlarged celiac axis, gastrohepatic ligament, and retroperitoneal lymph nodes. Unchanged prominent high right paratracheal lymph nodes. 4. Unchanged osseous metastases. 5. Slightly improved, although still extensive ground-glass airspace opacity primarily seen throughout the left lower lobe and lingula,nonspecific and infectious or inflammatory. 6. Coronary artery disease. 7. Emphysema.   02/11/2023 Imaging   Patient self decreased Lovenox dosage to 120mg  once daily.  Increased lower extremity swelling.   02/11/23 [place] bilateral lower extremity showed 1. Nonocclusive DVT within the popliteal veins bilaterally. 2. Occlusive DVT within the posterior tibial veins of the right calf.       02/13/2023 Imaging   CT chest Angiogram  1. Technically adequate exam showing no acute pulmonary embolus. 2. Cardiomegaly. Coronary artery calcifications. 3. Bilateral pleural effusions. 4. Improved aeration of the RIGHT UPPER  and LOWER lobes compared to prior study. 5. LEFT LOWER lobe and lingula airspace filling and  atelectasis, slightly improved compared to prior study. 6. Cirrhotic morphology of the liver. 7.7 centimeters mass in the RIGHT hepatic lobe. 7. Gastrohepatic ligament adenopathy. 8. A 7 millimeters sclerotic lesion in the UPPER sternum is suspicious for metastatic disease. 9. LEFT thyroid goiter.   02/13/2023 - 02/15/2023 Hospital Admission   A flutter with variable A-V block. Heart rate up to 140.  BNP 119.8 04/18: Echo no overt CHF - EF 50-55  Cardiology recs beta blocker to metoprolol succinate 50 mg bid, Torsemid 40mg  daily Adjusting Lovenox 14mg  BID.    03/03/2023 - 05/03/2023 Chemotherapy   Futibatinib 20mg , 03/12/23 Decreased to 16mg  daily Stopped in early July due to cancer progression.   04/24/2023 - 04/30/2023 Radiation Therapy   left ischium radiation.    05/20/2023 -  Chemotherapy   Patient is on Treatment Plan : COLORECTAL FOLFOX q14d       ALLERGIES:  has No Known Allergies.  MEDICATIONS:  Current Outpatient Medications  Medication Sig Dispense Refill   albuterol (VENTOLIN HFA) 108 (90 Base) MCG/ACT inhaler TAKE 2 PUFFS BY MOUTH EVERY 4 HOURS AS NEEDED 8.5 each 1   amoxicillin-clavulanate (AUGMENTIN) 875-125 MG tablet Take 1 tablet by mouth 2 (two) times daily for 10 days. 20 tablet 0   Calcium Carbonate 500 MG CHEW Chew 2 tablets (1,000 mg total) by mouth daily. 60 tablet 0   diphenoxylate-atropine (LOMOTIL) 2.5-0.025 MG tablet Take 1 tablet by mouth 4 (four) times daily as needed for diarrhea or loose stools. 120 tablet 3   fentaNYL (DURAGESIC) 50 MCG/HR Place 1 patch onto the skin every 3 (three) days. 10 patch 0   glipiZIDE (GLUCOTROL) 10 MG tablet Take 1 tablet (10 mg total) by mouth 2 (two) times daily before a meal. 180 tablet 3   glucose blood test strip 1 each by Other route as needed for other. accu chek aviva plusUse as instructed     ketoconazole (NIZORAL) 2 % cream Apply 1 Application topically 2 (two) times daily as needed for irritation. 60 g 5   Lactulose 20  GM/30ML SOLN Take 30 mLs (20 g total) by mouth daily as needed (constipation.). 450 mL 0   lidocaine-prilocaine (EMLA) cream Apply 1 Application topically as needed. 30 g 5   LORazepam (ATIVAN) 0.5 MG tablet TAKE 1 TABLET (0.5 MG TOTAL) BY MOUTH EVERY 8 (EIGHT) HOURS AS NEEDED FOR ANXIETY (NAUSEA VOMITING). 60 tablet 0   metFORMIN (GLUCOPHAGE) 500 MG tablet TAKE TWO TABLETS BY MOUTH EVERY MORNING WITH BREAKFAST 180 tablet 1   metoprolol tartrate (LOPRESSOR) 50 MG tablet Take 1 tablet (50 mg total) by mouth 2 (two) times daily. 180 tablet 3   MUCINEX D MAX STRENGTH 949 [id number] 5328 MG TB12 Take 1,200 mg by mouth 2 (two) times daily as needed (for congestion or to loosen mucous in the chest).     ondansetron (ZOFRAN) 4 MG tablet Take by mouth.     oxyCODONE (OXY IR/ROXICODONE) 5 MG immediate release tablet Take 1 tablet (5 mg total) by mouth every 4 (four) hours as needed for severe pain. 60 tablet 0   potassium chloride (KLOR-CON) 10 MEQ tablet Take 1 tablet (10 mEq total) by mouth daily. 90 tablet 3   pravastatin (PRAVACHOL) 20 MG tablet Take 1 tablet (20 mg total) by mouth daily. 90 tablet 3   prochlorperazine (COMPAZINE) 10 MG tablet Take 10 mg by mouth every 6 (six) hours  as needed.     silver sulfADIAZINE (SILVADENE) 1 % cream Apply 1 Application topically daily. 50 g 0   SLOW-MAG 71.5-119 MG TBEC SR tablet TAKE 1 TABLET (64 MG TOTAL) BY MOUTH DAILY 60 tablet 1   tadalafil (CIALIS) 20 MG tablet Take 0.5-1 tablets (10-20 mg total) by mouth every other day as needed for erectile dysfunction. 20 tablet 11   torsemide (DEMADEX) 20 MG tablet Take 1 tablet (20 mg total) by mouth 2 (two) times daily as needed. (Patient not taking: Reported on 07/08/2023) 180 tablet 3   traZODone (DESYREL) 50 MG tablet TAKE 1 TABLET BY MOUTH AT BEDTIME AS NEEDED FOR SLEEP. 30 tablet 1   triamcinolone cream (KENALOG) 0.1 % Apply 1 Application topically 3 (three) times daily. 80 g 5   warfarin (COUMADIN) 5 MG tablet Take 1 tablet  (5 mg total) by mouth daily. 30 tablet 5   No current facility-administered medications for this visit.   Facility-Administered Medications Ordered in Other Visits  Medication Dose Route Frequency Provider Last Rate Last Admin   sodium chloride flush (NS) 0.9 % injection 10 mL  10 mL Intravenous PRN [name], MD   10 mL at 07/18/23 0951    VITAL SIGNS: There were no vitals taken for this visit. There were no vitals filed for this visit.   Estimated body mass index is 29.85 kg/m as calculated from the following:   Height as of 07/10/23: 6\' 3"  (1.905 m).   Weight as of an earlier encounter on 07/18/23: 238 lb 12.8 oz (108.3 kg).  LABS: CBC:    Component Value Date/Time   WBC 4.0 07/18/2023 0945   WBC 2.5 (L) 07/08/2023 0932   HGB 10.7 (L) 07/18/2023 0945   HCT 33.8 (L) 07/18/2023 0945   PLT 125 (L) 07/18/2023 0945   MCV 93.6 07/18/2023 0945   NEUTROABS 3.1 07/18/2023 0945   LYMPHSABS 0.3 (L) 07/18/2023 0945   MONOABS 0.6 07/18/2023 0945   EOSABS 0.0 07/18/2023 0945   BASOSABS 0.0 07/18/2023 0945   Comprehensive Metabolic Panel:    Component Value Date/Time   NA 134 (L) 07/18/2023 0945   K 3.9 07/18/2023 0945   CL 100 07/18/2023 0945   CO2 25 07/18/2023 0945   BUN 7 (L) 07/18/2023 0945   CREATININE 0.78 07/18/2023 0945   GLUCOSE 138 (H) 07/18/2023 0945   CALCIUM 8.9 07/18/2023 0945   AST 33 07/18/2023 0945   ALT 18 07/18/2023 0945   ALKPHOS 117 07/18/2023 0945   BILITOT 1.1 07/18/2023 0945   PROT 5.7 (L) 07/18/2023 0945   ALBUMIN 2.9 (L) 07/18/2023 0945    RADIOGRAPHIC STUDIES: CT CHEST ABDOMEN PELVIS W CONTRAST  Result Date: 07/17/2023 CLINICAL DATA:  Cholangiocarcinoma restaging. Prior chemotherapy and radiation therapy. * Tracking Code: BO * EXAM: CT CHEST, ABDOMEN, AND PELVIS WITH CONTRAST TECHNIQUE: Multidetector CT imaging of the chest, abdomen and pelvis was performed following the standard protocol during bolus administration of intravenous contrast.  RADIATION DOSE REDUCTION: This exam was performed according to the departmental dose-optimization program which includes automated exposure control, adjustment of the mA and/or kV according to patient size and/or use of iterative reconstruction technique. CONTRAST:  ISOVUE-300 IOPAMIDOL (ISOVUE-300) INJECTION 61% COMPARISON:  04/25/2023 FINDINGS: CT CHEST FINDINGS Cardiovascular: Right Port-A-Cath tip: Lower SVC. Atheromatous vascular calcification of the thoracic aorta and coronary arteries. Borderline cardiomegaly. Mediastinum/Nodes: Stable left thyroid nodule 3.5 cm. In the setting of significant comorbidities or limited life expectancy, no follow-up recommended (ref: J Am  Coll Radiol. 2015 Feb;12(2): 143-50).Right paratracheal node 0.8 cm in short axis, formerly 1.1 cm, image 18 series 3. Another right paratracheal lymph node measures 0.8 cm on image 23 series 3, formerly 1.0 cm. Additional upper normal sized subcarinal infrahilar lymph nodes. No current overt pathologic adenopathy in the chest. Lungs/Pleura: Mild biapical pleuroparenchymal scarring. Mild scarring in the posterior basal segments of both lower lobes. Stable lingular scarring. The patchy opacities in the lingula and left lower lobe shown on the prior exam have resolved. Musculoskeletal: Enlarging lytic lesion of the T4 vertebral body eccentric to the right, 3.0 cm in long axis on image 107 series 8, formerly 1.9 cm in long axis. Enlarging lytic lesion in the T9 vertebral body. Essentially stable sclerotic lesion in the sternal body. New lytic lesion, cannot exclude pathologic fracture, posterolaterally in the ninth rib, image 130 series 5. Healing fracture of the right eighth rib laterally, nonspecific for underlying pathologic fracture, this could simply be a benign subacute rib fracture. Enlarging mixed lytic and sclerotic lesion of the left scapula, image 19 series 5. CT ABDOMEN PELVIS FINDINGS Hepatobiliary: Dominant right hepatic lobe  mass measures 7.4 by 5.8 cm on image 65 series 3, formerly 7.1 by 6.8 cm, roughly stable. Other stable tiny hypodense lesions in the right hepatic lobe and in segment 2 of the left hepatic lobe, nonspecific. Prominent caudate lobe, suspicion for early cirrhosis. Gallbladder unremarkable. No biliary dilatation. Patent portal vein. Pancreas: Unremarkable Spleen: Spleen measures 13.1 by 8.0 by 12.8 cm (volume = 700 cm^3), compatible with splenomegaly. No focal splenic lesion identified. Adrenals/Urinary Tract: Stable benign renal cystic lesions including the lesion with calcified septations in the right upper lobe compatible with benign Bosniak category 2 cyst based on previous and current imaging workup assessments. No further imaging workup of these lesions is indicated. Adrenal glands normal. Left distal ureter an urinary bladder partially obscured by streak artifact from the patient's left hip implant. No hydronephrosis or hydroureter. Stomach/Bowel: Focal high density in the small bowel at 1.2 cm on image 116 series 3, probably a high density pill fragment, less likely to be an intra lith. Otherwise unremarkable. Vascular/Lymphatic: Atherosclerosis is present, including aortoiliac atherosclerotic disease. Right gastric lymph nodes observed, including a 2.7 by 1.3 cm node on image 66 of series 3, previously 3.7 by 1.7 cm by my measurements, accordingly moderately reduced in size from previous. Aortocaval node 1.1 cm in short axis on image 81 series 3, formerly same. Other scattered smaller retroperitoneal and mesenteric lymph nodes are present. Reproductive: Mild prostatomegaly. Prostate gland partially obscured by streak artifact from the patient's left hip implant. Other: Mild ascites and scattered mesenteric edema. Confluent left upper quadrant omental edema without overt omental nodularity. Musculoskeletal: Enlarging lytic metastatic lesions including the left T12 pedicle, the right L4 inferior endplate, the L5  spinous process, the right hip quadrilateral plate, the bilateral ischium and the left posterior acetabulum. Mild increase in size of the sclerotic lesion in the right iliac bone on image 105 series 3. Cannot exclude epidural extension of tumor adjacent to the T12 posterior element involvement. IMPRESSION: 1. Mixed response to therapy. The dominant right hepatic lobe mass is roughly stable in size. The right gastric lymph node has moderately reduced in size. The right paratracheal lymph nodes have decreased in size. However, there are multiple enlarging lytic lesions in the axial and appendicular skeleton, including the T4 vertebral body, the T9 vertebral body, the left scapula, the right hip quadrilateral plate, the bilateral ischium, and the  left posterior acetabulum. Cannot exclude epidural extension of tumor adjacent to the T12 posterior element involvement. 2. Healing fracture of the right eighth rib laterally, nonspecific for underlying pathologic fracture, this could simply be a benign subacute rib fracture. 3. Mild ascites and scattered mesenteric edema. Confluent left upper quadrant omental edema without overt omental nodularity. 4. Splenomegaly. 5. Mild prostatomegaly. 6. Aortic atherosclerosis. 7. The patchy opacities in the lingula and left lower lobe shown on the prior exam have resolved. Aortic Atherosclerosis (ICD10-I70.0). Electronically Signed   By: Gaylyn Rong M.D.   On: 07/17/2023 11:31   DG Chest 2 View  Result Date: 07/06/2023 CLINICAL DATA:  cough, green sputum EXAM: CHEST - 2 VIEW COMPARISON:  February 11, 2023, April 25, 2023 FINDINGS: The cardiomediastinal silhouette is unchanged in contour.RIGHT chest port with tip terminating over the superior cavoatrial junction. No pleural effusion. No pneumothorax. Similar LEFT basilar coarse reticular prominence, better evaluated on prior CT. No acute pleuroparenchymal abnormality. Visualized abdomen is unremarkable. Multilevel degenerative  changes of the thoracic spine. IMPRESSION: Similar LEFT basilar coarse reticular prominence, better evaluated on prior CT. No new focal airspace opacity. Electronically Signed   By: Meda Klinefelter M.D.   On: 07/06/2023 16:49    PERFORMANCE STATUS (ECOG) : 1 - Symptomatic but completely ambulatory  Review of Systems Unless otherwise noted, a complete review of systems is negative.  Physical Exam General: NAD Pulmonary: Unlabored Extremities: no edema, no joint deformities Skin: no rashes Neurological: Weakness but otherwise nonfocal  IMPRESSION: Follow-up visit.  CT of the chest abdomen and pelvis on 07/16/2023 shows mixed response to therapy with stable hepatic mass, improved gastric and peritracheal lymphadenopathy but enlarging lytic lesions.  Patient was seen by Dr. Cathie Hoops with plan to start next line treatment.   Patient confirms that he is interested in pursuing further treatment as he is "not giving up."  However, he continues to relay the importance of quality of life and his decision making.  Symptomatically, he is feeling better.  Appetite has improved.  He does endorse weakness with the recent fall.  Patient is interested in referral to care program.  Patient has reviewed MOST form but is unclear about decision making.  PLAN: -Continue current scope of treatment -Referral to CARE program -Patient to bring in MOST form for review -Follow-up to phone visit 1 month   Patient expressed understanding and was in agreement with this plan. He also understands that He can call the clinic at any time with any questions, concerns, or complaints.     Time Total: 15 minutes  Visit consisted of counseling and education dealing with the complex and emotionally intense issues of symptom management and palliative care in the setting of serious and potentially life-threatening illness.Greater than 50%  of this time was spent counseling and coordinating care related to the above  assessment and plan.  Signed by: Laurette Schimke, PhD, NP-C

## 2023-07-18 NOTE — Assessment & Plan Note (Signed)
Continue calcium supplementation, currently on 1000 mg twice daily.   Recommend Vitamin D 1000 units daily.

## 2023-07-18 NOTE — Progress Notes (Signed)
Hematology/Oncology Progress note [phone] [phone]    CHIEF COMPLAINTS/REASON FOR VISIT:  Follow up for right lower extremity DVT, antiphospholipid syndrome, metastatic cholangiocarcinoma.  ASSESSMENT & PLAN:   Cancer Staging  Cholangiocarcinoma [facility]) Staging form: Intrahepatic Bile Duct, AJCC 8th Edition - Clinical stage from 08/27/2022: Stage IV (cT2, cN1, cM1) - Signed by [name], MD on 09/13/2022   Cholangiocarcinoma (HCC) Cholangiocarcinoma, NGS-Tempus liquid biopsy FGFR2 -ADK mutation, TMB 0,  S/p Gemcitabine, Cisplatin and Durvalumab x 6 , 01/2023 CT progression/enlarged liver lesions --03/03/23 2nd line Futibatinib- 04/26/23 CT showed interval progression  Recommend patient to stop Futibatinib. [facility] 2nd opinion was reviewed.  Recommend to proceed with 3rd line treatment with FOLFOX  Labs are reviewed and discussed with patient. CT scan results were discussed with patient, mixed response.  Shared decision was made to resume chemotherapy with dose reduced FOLFOX [Oxaliplatin 65mg /m2, omit 5-FU bolus]  next week.    Antiphospholipid syndrome (HCC) #Recurrent lower extremity DVT, secondary to antiphospholipid syndrome.-. Need  life time anticoagulation Currently on coumadin with INR goal of 2-3.   INR improved, 1.9.  INR result will be sent to PCP's office for coumadin dosing.    Hypocalcemia Continue calcium supplementation, currently on 1000 mg twice daily.   Recommend Vitamin D 1000 units daily.   Metastasis to bone [facility]) S/p palliative  radiation  Consider bisphosphonate after dental clearance is obtained. S/p left ischium.  radiation due to concern of pathological fracture.  Right thigh pain, no RT per Radonc. Felt was due to arthritis.  CT showed worse of bone metastatic disease, T12 epidural tumor extension- check MRI thoracic and lumbar spine  Will discuss with Radonc to radiate T12   Hypokalemia Recommend patient continue  potassium chloride 20 meq BID  Atrial fibrillation (HCC) And cardiomyopathy, follow up with cardiology Rate controlled. S/p ablation on 03/26/23   Weight loss Due to poor oral intake secondary to decreased appetite.  He feels that appetite is coming back today.  I recommend appetite stimulants which he would like to hold off for now. Continue nutritional supplementation. Follow with nutritionist.   Recommend influenza vaccination.  He received in office. Orders Placed This Encounter  Procedures   MR Thoracic Spine W Wo Contrast    Standing Status:   Future    Number of Occurrences:   1    Standing Expiration Date:   07/17/2024    Order Specific Question:   GRA to provide read?    Answer:   Yes    Order Specific Question:   If indicated for the ordered procedure, I authorize the administration of contrast media per Radiology protocol    Answer:   Yes    Order Specific Question:   What is the patient's sedation requirement?    Answer:   No Sedation    Order Specific Question:   Use SRS Protocol?    Answer:   No    Order Specific Question:   Does the patient have a pacemaker or implanted devices?    Answer:   No    Order Specific Question:   Preferred imaging location?    Answer:   [facility] (table limit - 550lbs)    Order Specific Question:   Call Results- Best Contact Number?    Answer:   [phone] / do not hold patient   MR Lumbar Spine W Wo Contrast    Standing Status:   Future    Number of Occurrences:   1  Standing Expiration Date:   07/17/2024    Order Specific Question:   If indicated for the ordered procedure, I authorize the administration of contrast media per Radiology protocol    Answer:   Yes    Order Specific Question:   What is the patient's sedation requirement?    Answer:   No Sedation    Order Specific Question:   Does the patient have a pacemaker or implanted devices?    Answer:   No    Order Specific Question:   Use SRS Protocol?    Answer:   No     Order Specific Question:   Call Results- Best Contact Number?    Answer:   [phone] / do not hold patient    Order Specific Question:   Preferred imaging location?    Answer:   [facility] (table limit - 550lbs)   Cancer antigen 19-9    Standing Status:   Future    Standing Expiration Date:   07/23/2024   CEA    Standing Status:   Future    Standing Expiration Date:   07/23/2024   CBC with Differential (Cancer Center Only)    Standing Status:   Future    Standing Expiration Date:   07/23/2024   CMP (Cancer Center only)    Standing Status:   Future    Standing Expiration Date:   07/23/2024   Follow up 1 week lab MD chemo  All questions were answered. The patient knows to call the clinic with any problems, questions or concerns.  [name], MD, PhD [facility] Hematology Oncology 07/18/2023    HISTORY OF PRESENTING ILLNESS:  Patient reports remote history of left lower extremity DVT in 2002.  He was initiated on Lovenox and bridged to Coumadin.  Patient took warfarin for 2 years before anticoagulation was stopped. 12/03/2021, patient presented to emergency room for evaluation of right lower extremity pain and swelling for about a week.  Started on the inner side of right thigh and migrated to the right calf. + Associated with swelling.  Denies any recent injury, hospitalization, surgery.  He first noticed the symptoms after playing basketball with his grandson.   12/03/2021, right lower extremity ultrasound showed occlusive DVT extending from the mid aspect of the femoral vein through the imaged tibial vein.  Age-indeterminate. Patient was started on Xarelto. He was referred to establish care with vascular surgeon and was seen by Sheppard Plumber on 12/06/2021.  Shared decision was made not to proceed with embolectomy.  Continue anticoagulation. Patient was referred to hematology oncology for further evaluation.  Patient denies any family history of blood clots.  Denies any  unintentional weight loss, fever or night sweats. 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  He sometimes stay in his car while waiting for next assessment appointment.  He reports the right lower extremity symptom has improved since the start of Xarelto.  No active bleeding events.  #12/18/2021 hypercoagulable work-up showed JAK2 V617F mutation negative, with reflex to other mutations CALR, MPL, JAK 2 Ex 12-15 mutations negative, negative anticardiolipin IgG and IgM antibodies, positive lupus anticoagulant, negative factor V Leiden mutation, negative prothrombin gene mutation, normal protein C activity, normal protein S antigen level. Patient was recommended to switch to Coumadin with INR goal of 2-3.  #03/15/2022, repeat lupus anticoagulant is persistently positive- + antiphospholipid syndrome.  Patient is currently on Lovenox 1 mg/kg twice daily.  admitted due to multifocal pneumonia, treated with Influenza panel negative, COVID-negative, HIV negative, Complete course of antibiotics with Zithromax, Vantin   + HCV antibody positive, HCV RNA quantification not detected. HCV RT PCR not detected.   INTERVAL HISTORY Christopher Marsh is a 62 y.o. male who has above history reviewed by me today presents for follow up visit for management of right lower extremity DVT and antiphospholipid syndrome, metastatic cholangiocarcinoma.  Oncology History  Cholangiocarcinoma (HCC)  10/30/2021 Imaging   CT chest wo contrast 1. Heterogeneous bilateral ground-glass disease, multilobar but worst in the right middle lobe, lingula and lower lobes. There is progression of the abnormality since 10/12/2022. Though pneumonia could produce this appearance, given persistence and fluctuating appearance since October, consider alternative entities such as hypersensitivity pneumonitis or idiopathic interstitial pneumonia. 2. Slightly  enlarged pulmonary trunk, possible arterial hypertension 3. Redemonstrated large hypodense right hepatic lobe mass.   08/18/2022 Imaging   CT chest w contrast showed 1. Multifocal airspace disease in both lungs, left side greater than right. Findings are most compatible with multifocal pneumonia. 2. Multiple new hepatic lesions with enlarging lymph nodes in the upper abdomen and chest. Findings are concerning for metastatic disease. Based on the multifocal pneumonia, hepatic abscesses would also be in the differential diagnosis. Recommend further characterization of the abdomen and pelvis with CT with IV contrast. 3. Enlargement of the main pulmonary artery could be associated with pulmonary hypertension. 4. Coronary artery calcifications. 5. Multinodular goiter. Patient has known thyroid nodules and previous thyroid ultrasound.    08/18/2022 Imaging   CT abdomen pelvis w contrast  Interval development of 6.5 x 5.2 cm heterogeneously enhancing mass in right hepatic lobe. This is highly concerning for neoplasm or malignancy, and further evaluation with MRI is recommended.   Mildly enlarged periaortic adenopathy is noted, with the largest lymph node measuring 12 mm. Metastatic disease cannot be excluded. Mildly enlarged adenopathy is also noted in the gastrohepatic ligament, but this is unchanged compared to prior exam.   Stable bibasilar lung opacities are noted concerning for inflammation, atelectasis or possibly scarring.   Grossly stable multi-septated cystic lesion is seen involving upper pole of right kidney with peripheral calcifications compared to prior exam of 2019, most likely representing benign etiology.   Aortic Atherosclerosis    08/18/2022 - 08/21/2022 Hospital Admission   Admitted due to multifocal pneumonia, treated with Influenza panel negative, COVID-negative, HIV negative, Complete course of antibiotics with Zithromax, Vantin     08/19/2022 Imaging   MR abdomen w wo  contrast  Marked caudate lobe hypertrophy, highly suspicious for hepatic cirrhosis.   Numerous hypervascular masses throughout the right hepatic lobe, highly suspicious for multifocal hepatocellular carcinoma. Recommend correlation with AFP and consider tissue sampling.   Mild abdominal lymphadenopathy, with differential diagnosis including metastatic disease and reactive lymphadenopathy in setting of cirrhosis.   Bilateral lower lobe infiltrates, as better demonstrated on recent CT.    08/19/2022 Tumor Marker   AFP 4.1   08/27/2022 Initial Diagnosis   Cholangiocarcinoma  -08/20/22 Liver mass biopsy showed poorly differentiated carcinoma Immunohistochemical stains show that the poorly differentiated carcinoma is positive for CK7 and MOC-31, suggestive of a poorly differentiated adenocarcinoma.  Immunohistochemical stains for CK20, CDX2, HepPar 1 and arginase are negative.  Immunostain for glypican-3 shows very focal  likely nonspecific labeling.  This immunoprofile is nonspecific but can be compatible with a poorly differentiated primary cholangiocarcinoma in absence of any other lesions.   NGS showed BAP 1 mutation.  Case was presented  at tumor board, I have also discussed with pathologist Dr.Rubinas,  IHC pattern is more consistent with adenocarcinoma, unlikely HCC [due to negative HepPar 1 and arginase], cholangiocarcinoma is favored, although this is a diagnosis of exclusion. Second opinion ar [facility]   08/27/2022 Cancer Staging   Staging form: Intrahepatic Bile Duct, AJCC 8th Edition - Clinical stage from 08/27/2022: Stage IV (cT2, cN1, cM1) - Signed by [name], MD on 09/13/2022 Stage prefix: Initial diagnosis   08/27/2022 Tumor Marker   CA 19.9  16 CEA 2.8   09/12/2022 Imaging   PET scan showed 1. Large right hepatic lobe mass is hypermetabolic and consistent with known cholangiocarcinoma. 2. Scattered borderline enlarged upper abdominal lymph nodes do not show any significant  FDG uptake. 3. 4 hypermetabolic bone lesions consistent with metastatic disease. 4. Progressive diffuse airspace process in the left lung could be due to atypical pneumonia, pulmonary hemorrhage or left-sided aspiration. 5. Aortic atherosclerosis.  09/12/2022 I had a phone discussion with patient after his PET scan resulted.  Recommend systemic chemotherapy.  Patient would like to defer until his second opinion visit at [facility].   09/15/2022 Imaging   CT angio chest pulmonary embolism protocol showed 1. No evidence for pulmonary embolism. 2. Progression of bilateral multifocal patchy ground-glass and interstitial opacities, left greater than right. Findings are concerning for multifocal pneumonia. 3. Air-fluid levels in the left lower lobe bronchi worrisome for aspiration. 4. Mediastinal and hilar lymphadenopathy has increased in size and number when compared to the prior study, likely reactive.5. Cardiomegaly. 6. Findings compatible with pulmonary artery hypertension.7. Stable right liver mass and complex right renal cystic lesion. 8. Stable enlargement of the left thyroid.   09/15/2022 - 09/17/2022 Hospital Admission   Patient was admitted due to shortness of breath, CT showed progression of bilateral multifocal patchy groundglass and interstitial opacities.  Left greater than right.  Concerning for multifocal pneumonia.  Patient was started on broad-spectrum antibiotics with significant improvement.  He was discharged on oral antibiotics.   09/19/2022 Miscellaneous   Patient went to [facility] for second opinion.  He was seen by Shirlee Latch, who felt that the presentation is consistent with metastatic cholangiocarcinoma, and agrees with the plan of cisplatin/gemcitabine/durvalumab..    10/11/2022 - 01/31/2023 Chemotherapy   Patient is on Treatment Plan : Cisplatin D1,8 + Gemcitabine + Durvalumab  D1,8 q21d x     10/12/2022 Imaging   CT chest w contrast 1. Bilateral airspace and ground-glass  opacities have mildly decreased in amount and density when compared to the prior study. Findings are compatible with resolving multifocal pneumonia. Continued follow-up imaging recommended. 2. Stable enlarged heterogeneous left thyroid gland with 3.2 cm nodule. This can be further evaluated with dedicated ultrasound. 3. Stable hepatic and right renal lesions, incompletely evaluated.   Aortic Atherosclerosis (ICD10-I70.0).   12/11/2022 Imaging   PET showed  1. There is been mild increase in size of the tracer avid mass within right lobe of liver compatible with known cholangiocarcinoma. The degree of tracer uptake however is slightly decreased in the interval. 2. Multifocal tracer avid bone metastases are again noted. When compared with the previous exam there has been an mixed interval response to therapy. Specifically, there has been interval increase in size and FDG uptake associated with the right ischial tuberosity lesion and there is a new lesion within the posterior left iliac bone. Decreased tracer uptake is associated with the left scapular lesion and there is stable tracer uptake associated with the right T1 transverse process  lesion. 3. Persistent ground-glass and airspace densities within the left upper lobe and left lower lobe compatible with inflammatory or infectious process. Interval resolution of previous right lung ground-glass and airspace densities.    12/24/2022 Procedure   Medi port placement by Dr. Wyn Quaker   01/22/2023 Imaging   CT chest abdomen pelvis with contrast 1. Hypodense lesion of the right lobe of the liver, hepatic segment VII/VIII is slightly diminished in size, consistent with treatment response. 2. No evident change in additional smaller hypodense lesions scattered throughout the right lobe of the liver, these more difficult to see on prior noncontrast PET-CT. 3. Unchanged enlarged celiac axis, gastrohepatic ligament, and retroperitoneal lymph nodes. Unchanged  prominent high right paratracheal lymph nodes. 4. Unchanged osseous metastases. 5. Slightly improved, although still extensive ground-glass airspace opacity primarily seen throughout the left lower lobe and lingula,nonspecific and infectious or inflammatory. 6. Coronary artery disease. 7. Emphysema.   02/11/2023 Imaging   Patient self decreased Lovenox dosage to 120mg  once daily.  Increased lower extremity swelling.   02/11/23 [place] bilateral lower extremity showed 1. Nonocclusive DVT within the popliteal veins bilaterally. 2. Occlusive DVT within the posterior tibial veins of the right calf.       02/13/2023 Imaging   CT chest Angiogram  1. Technically adequate exam showing no acute pulmonary embolus. 2. Cardiomegaly. Coronary artery calcifications. 3. Bilateral pleural effusions. 4. Improved aeration of the RIGHT UPPER and LOWER lobes compared to prior study. 5. LEFT LOWER lobe and lingula airspace filling and atelectasis, slightly improved compared to prior study. 6. Cirrhotic morphology of the liver. 7.7 centimeters mass in the RIGHT hepatic lobe. 7. Gastrohepatic ligament adenopathy. 8. A 7 millimeters sclerotic lesion in the UPPER sternum is suspicious for metastatic disease. 9. LEFT thyroid goiter.   02/13/2023 - 02/15/2023 Hospital Admission   A flutter with variable A-V block. Heart rate up to 140.  BNP 119.8 04/18: Echo no overt CHF - EF 50-55  Cardiology recs beta blocker to metoprolol succinate 50 mg bid, Torsemid 40mg  daily Adjusting Lovenox 14mg  BID.    03/03/2023 - 05/03/2023 Chemotherapy   Futibatinib 20mg , 03/12/23 Decreased to 16mg  daily Stopped in early July due to cancer progression.   04/24/2023 - 04/30/2023 Radiation Therapy   left ischium radiation.    05/20/2023 -  Chemotherapy   Patient is on Treatment Plan : COLORECTAL FOLFOX q14d      INTERVAL HISTORY Christopher Marsh is a 62 y.o. male who has above history reviewed by me today presents for follow up visit  for Cholangiocarcinoma, antiphospholipid syndrome, thrombosis + Fatigue, Appetite is good. Lost weight  + Falls. He has started on Coumadin, currently on 5mg , take 1 tablet daily except take 1/2 tablet on Mondays and Thursdays  Denies any bleeding events.   Review of Systems  Constitutional:  Positive for fatigue and unexpected weight change. Negative for appetite change, chills and fever.  HENT:   Negative for hearing loss and voice change.   Eyes:  Negative for eye problems.  Respiratory:  Negative for chest tightness, cough and shortness of breath.   Cardiovascular:  Positive for leg swelling. Negative for chest pain.  Gastrointestinal:  Negative for abdominal distention, abdominal pain, blood in stool and diarrhea.  Endocrine: Negative for hot flashes.  Genitourinary:  Negative for difficulty urinating and frequency.   Musculoskeletal:  Negative for arthralgias.  Skin:  Negative for itching.       Left butt blister is better.   Neurological:  Negative for extremity  weakness.  Hematological:  Negative for adenopathy. Bruises/bleeds easily.  Psychiatric/Behavioral:  Negative for confusion.     MEDICAL HISTORY:  Past Medical History:  Diagnosis Date   Antiphospholipid antibody syndrome (HCC)    Arthritis    Cancer (HCC)    Diabetes mellitus without complication (HCC)    DVT (deep venous thrombosis) (HCC)    Lt leg   Dyspnea    Elevated lipids    Erythrocytosis    Hyperlipidemia    Hypertension    Lower extremity edema    Multinodular thyroid    Post-thrombotic syndrome     SURGICAL HISTORY: Past Surgical History:  Procedure Laterality Date   CARDIOVERSION N/A 03/22/2023   Procedure: CARDIOVERSION;  Surgeon: Antonieta Iba, MD;  Location: [facility] ORS;  Service: Cardiovascular;  Laterality: N/A;   COLONOSCOPY WITH PROPOFOL N/A 09/23/2018   Per Dr. Norma Fredrickson, polyp, repeat in 3 yrs    EYE MUSCLE SURGERY     JOINT REPLACEMENT Left 07/22/2017   PORTA CATH INSERTION N/A  12/24/2022   Procedure: PORTA CATH INSERTION;  Surgeon: Annice Needy, MD;  Location: [facility] INVASIVE CV LAB;  Service: Cardiovascular;  Laterality: N/A;   TOTAL HIP ARTHROPLASTY Left 07/22/2017   Procedure: TOTAL HIP ARTHROPLASTY;  Surgeon: Donato Heinz, MD;  Location: [facility] ORS;  Service: Orthopedics;  Laterality: Left;    SOCIAL HISTORY: Social History   Socioeconomic History   Marital status: Married    Spouse name: Not on file   Number of children: Not on file   Years of education: Not on file   Highest education level: Not on file  Occupational History   Not on file  Tobacco Use   Smoking status: Former    Current packs/day: 0.00    Average packs/day: 0.5 packs/day for 6.0 years (3.0 ttl pk-yrs)    Types: Cigarettes    Start date: 37    Quit date: 60    Years since quitting: 42.7   Smokeless tobacco: Never   Tobacco comments:    Pt states when he did smoke he smoked at most 1 ppd. ALS 09/26/2022  Vaping Use   Vaping status: Never Used  Substance and Sexual Activity   Alcohol use: Not Currently    Comment: occ   Drug use: No   Sexual activity: Not on file  Other Topics Concern   Not on file  Social History Narrative   Not on file   Social Determinants of Health   Financial Resource Strain: Low Risk  (08/27/2022)   Overall Financial Resource Strain (CARDIA)    Difficulty of Paying Living Expenses: Not very hard  Food Insecurity: No Food Insecurity (02/18/2023)   Hunger Vital Sign    Worried About Running Out of Food in the Last Year: Never true    Ran Out of Food in the Last Year: Never true  Transportation Needs: No Transportation Needs (02/18/2023)   PRAPARE - Administrator, Civil Service (Medical): No    Lack of Transportation (Non-Medical): No  Physical Activity: Insufficiently Active (08/27/2022)   Exercise Vital Sign    Days of Exercise per Week: 3 days    Minutes of Exercise per Session: 30 min  Stress: Stress Concern Present (08/27/2022)    [employer] of Occupational Health - Occupational Stress Questionnaire    Feeling of Stress : Rather much  Social Connections: Socially Integrated (08/27/2022)   Social Connection and Isolation Panel [NHANES]    Frequency of Communication with Friends and  Family: More than three times a week    Frequency of Social Gatherings with Friends and Family: More than three times a week    Attends Religious Services: 1 to 4 times per year    Active Member of [employer] or Organizations: Yes    Attends Banker Meetings: Never    Marital Status: Married  Catering manager Violence: Not At Risk (02/14/2023)   Humiliation, Afraid, Rape, and Kick questionnaire    Fear of Current or Ex-Partner: No    Emotionally Abused: No    Physically Abused: No    Sexually Abused: No    FAMILY HISTORY: Family History  Problem Relation Age of Onset   Breast cancer Mother    Emphysema Father    Colon cancer Maternal Grandmother     ALLERGIES:  has No Known Allergies.  MEDICATIONS:  Current Outpatient Medications  Medication Sig Dispense Refill   albuterol (VENTOLIN HFA) 108 (90 Base) MCG/ACT inhaler TAKE 2 PUFFS BY MOUTH EVERY 4 HOURS AS NEEDED 8.5 each 1   amoxicillin-clavulanate (AUGMENTIN) 875-125 MG tablet Take 1 tablet by mouth 2 (two) times daily for 10 days. 20 tablet 0   Calcium Carbonate 500 MG CHEW Chew 2 tablets (1,000 mg total) by mouth daily. 60 tablet 0   diphenoxylate-atropine (LOMOTIL) 2.5-0.025 MG tablet Take 1 tablet by mouth 4 (four) times daily as needed for diarrhea or loose stools. 120 tablet 3   glipiZIDE (GLUCOTROL) 10 MG tablet Take 1 tablet (10 mg total) by mouth 2 (two) times daily before a meal. 180 tablet 3   glucose blood test strip 1 each by Other route as needed for other. accu chek aviva plusUse as instructed     ketoconazole (NIZORAL) 2 % cream Apply 1 Application topically 2 (two) times daily as needed for irritation. 60 g 5   Lactulose 20 GM/30ML SOLN Take 30  mLs (20 g total) by mouth daily as needed (constipation.). 450 mL 0   lidocaine-prilocaine (EMLA) cream Apply 1 Application topically as needed. 30 g 5   LORazepam (ATIVAN) 0.5 MG tablet TAKE 1 TABLET (0.5 MG TOTAL) BY MOUTH EVERY 8 (EIGHT) HOURS AS NEEDED FOR ANXIETY (NAUSEA VOMITING). 60 tablet 0   metFORMIN (GLUCOPHAGE) 500 MG tablet TAKE TWO TABLETS BY MOUTH EVERY MORNING WITH BREAKFAST 180 tablet 1   metoprolol tartrate (LOPRESSOR) 50 MG tablet Take 1 tablet (50 mg total) by mouth 2 (two) times daily. 180 tablet 3   MUCINEX D MAX STRENGTH [phone] MG TB12 Take 1,200 mg by mouth 2 (two) times daily as needed (for congestion or to loosen mucous in the chest).     ondansetron (ZOFRAN) 4 MG tablet Take by mouth.     oxyCODONE (OXY IR/ROXICODONE) 5 MG immediate release tablet Take 1 tablet (5 mg total) by mouth every 4 (four) hours as needed for severe pain. 60 tablet 0   pravastatin (PRAVACHOL) 20 MG tablet Take 1 tablet (20 mg total) by mouth daily. 90 tablet 3   prochlorperazine (COMPAZINE) 10 MG tablet Take 10 mg by mouth every 6 (six) hours as needed.     silver sulfADIAZINE (SILVADENE) 1 % cream Apply 1 Application topically daily. 50 g 0   SLOW-MAG 71.5-119 MG TBEC SR tablet TAKE 1 TABLET (64 MG TOTAL) BY MOUTH DAILY 60 tablet 1   tadalafil (CIALIS) 20 MG tablet Take 0.5-1 tablets (10-20 mg total) by mouth every other day as needed for erectile dysfunction. 20 tablet 11   traZODone (DESYREL) 50  MG tablet TAKE 1 TABLET BY MOUTH AT BEDTIME AS NEEDED FOR SLEEP. 30 tablet 1   triamcinolone cream (KENALOG) 0.1 % Apply 1 Application topically 3 (three) times daily. 80 g 5   warfarin (COUMADIN) 5 MG tablet Take 1 tablet (5 mg total) by mouth daily. 30 tablet 5   fentaNYL (DURAGESIC) 50 MCG/HR Place 1 patch onto the skin every 3 (three) days. 10 patch 0   potassium chloride (KLOR-CON) 10 MEQ tablet Take 1 tablet (10 mEq total) by mouth daily. 90 tablet 3   torsemide (DEMADEX) 20 MG tablet Take 1  tablet (20 mg total) by mouth 2 (two) times daily as needed. (Patient not taking: Reported on 07/08/2023) 180 tablet 3   No current facility-administered medications for this visit.   Facility-Administered Medications Ordered in Other Visits  Medication Dose Route Frequency Provider Last Rate Last Admin   sodium chloride flush (NS) 0.9 % injection 10 mL  10 mL Intravenous PRN [name], MD   10 mL at 07/18/23 0951     PHYSICAL EXAMINATION: ECOG PERFORMANCE STATUS: 1 - Symptomatic but completely ambulatory Vitals:   07/18/23 1002  BP: 126/67  Pulse: 60  Resp: 18  Temp: 97.7 F (36.5 C)   Filed Weights   07/18/23 1002  Weight: 238 lb 12.8 oz (108.3 kg)    Physical Exam Constitutional:      General: He is not in acute distress. HENT:     Head: Normocephalic and atraumatic.  Eyes:     General: No scleral icterus. Cardiovascular:     Rate and Rhythm: Normal rate.  Pulmonary:     Effort: Pulmonary effort is normal. No respiratory distress.     Breath sounds: No wheezing.  Abdominal:     General: Bowel sounds are normal. There is no distension.  Musculoskeletal:        General: Normal range of motion.     Cervical back: Normal range of motion and neck supple.     Right lower leg: Edema present.     Left lower leg: Edema present.  Skin:    General: Skin is warm and dry.     Findings: No erythema.  Neurological:     Mental Status: He is alert and oriented to person, place, and time. Mental status is at baseline.  Psychiatric:        Mood and Affect: Mood normal.     LABORATORY DATA:  I have reviewed the data as listed    Latest Ref Rng & Units 07/18/2023    9:45 AM 07/08/2023    9:32 AM 06/24/2023    9:12 AM  CBC  WBC 4.0 - 10.5 K/uL 4.0  2.5  2.7   Hemoglobin 13.0 - 17.0 g/dL 29.5  62.1  30.8   Hematocrit 39.0 - 52.0 % 33.8  34.8  34.9   Platelets 150 - 400 K/uL 125  100  169       Latest Ref Rng & Units 07/18/2023    9:45 AM 07/08/2023    9:32 AM 06/24/2023     9:12 AM  CMP  Glucose 70 - 99 mg/dL 657  94  88   BUN 8 - 23 mg/dL 7  10  9    Creatinine 0.61 - 1.24 mg/dL 8.46  9.62  9.52   Sodium 135 - 145 mmol/L 134  133  132   Potassium 3.5 - 5.1 mmol/L 3.9  3.2  3.9   Chloride 98 - 111 mmol/L 100  103  103   CO2 22 - 32 mmol/L 25  24  25    Calcium 8.9 - 10.3 mg/dL 8.9  8.4  8.6   Total Protein 6.5 - 8.1 g/dL 5.7  6.0  5.9   Total Bilirubin 0.3 - 1.2 mg/dL 1.1  0.9  0.8   Alkaline Phos 38 - 126 U/L 117  115  111   AST 15 - 41 U/L 33  39  35   ALT 0 - 44 U/L 18  24  20      RADIOGRAPHIC STUDIES: I have personally reviewed the radiological images as listed and agreed with the findings in the report. MR Lumbar Spine W Wo Contrast  Result Date: 07/18/2023 CLINICAL DATA:  spine lesions, lower extremity weakness. History of gallbladder cancer with back pain and bilateral leg weakness. EXAM: MRI THORACIC AND LUMBAR SPINE WITHOUT AND WITH CONTRAST TECHNIQUE: Multiplanar and multiecho pulse sequences of the thoracic and lumbar spine were obtained without and with intravenous contrast. CONTRAST:  10mL GADAVIST GADOBUTROL 1 MMOL/ML IV SOLN COMPARISON:  CT chest/abdomen/pelvis 07/16/2023. FINDINGS: MRI THORACIC SPINE FINDINGS Alignment:  Normal. Vertebrae: Multiple enhancing lesions in the thoracic spine, including the right aspect of the T4 and T9 vertebral bodies, as well as the posterior elements of T12 and spinous process of T4. No epidural or paraspinal extension of tumor. Cord: Normal spinal cord signal and volume. No abnormal enhancement. Paraspinal and other soft tissues: No acute findings. See recent body CT report for description of findings in the chest. Disc levels: No significant disc herniation or spinal canal stenosis. Right-greater-than-left facet arthropathy at T11-12 results in moderate right neural foraminal narrowing. MRI LUMBAR SPINE FINDINGS Segmentation:  Standard. Alignment:  Normal. Vertebrae: Congenitally short pedicles throughout the lumbar  spine. Heterogeneously enhancing lesion along the right aspect of the inferior endplate of L4, possibly an acute Schmorl's node versus site of metastasis. Enhancing lesion in the spinous process of L5. Enhancing lesion in the left posterior elements of T12 with mild extension into the left dorsal epidural space (axial image 2 series 4 and 7) and mild paraspinal extension. Enhancing lesions in the right sacral ala at the S1 level and right iliac crest. Conus medullaris: Extends to the L1-2 level and appears normal. No abnormal enhancement of the cauda equina nerve roots. Paraspinal and other soft tissues: No acute findings. See recent body CT report for description of intra-abdominal and pelvic findings. Disc levels: Disc bulge and facet arthropathy results in severe spinal canal stenosis at L4-5. Left eccentric disc bulge compresses the traversing left S1 nerve root in the subarticular zone at L5-S1. IMPRESSION: 1. Multiple enhancing lesions in the thoracic and lumbar spine, right sacral ala, and right iliac crest, consistent with metastatic disease. 2. Enhancing lesion in the left posterior elements of T12 with mild extension into the left dorsal epidural space and mild paraspinal extension. No significant spinal canal stenosis. 3. Severe spinal canal stenosis at L4-5. 4. Left eccentric disc bulge compresses the traversing left S1 nerve root in the subarticular zone at L5-S1. Electronically Signed   By: Orvan Falconer M.D.   On: 07/18/2023 15:11   MR Thoracic Spine W Wo Contrast  Result Date: 07/18/2023 CLINICAL DATA:  spine lesions, lower extremity weakness. History of gallbladder cancer with back pain and bilateral leg weakness. EXAM: MRI THORACIC AND LUMBAR SPINE WITHOUT AND WITH CONTRAST TECHNIQUE: Multiplanar and multiecho pulse sequences of the thoracic and lumbar spine were obtained without and with intravenous contrast. CONTRAST:  10mL  GADAVIST GADOBUTROL 1 MMOL/ML IV SOLN COMPARISON:  CT  chest/abdomen/pelvis 07/16/2023. FINDINGS: MRI THORACIC SPINE FINDINGS Alignment:  Normal. Vertebrae: Multiple enhancing lesions in the thoracic spine, including the right aspect of the T4 and T9 vertebral bodies, as well as the posterior elements of T12 and spinous process of T4. No epidural or paraspinal extension of tumor. Cord: Normal spinal cord signal and volume. No abnormal enhancement. Paraspinal and other soft tissues: No acute findings. See recent body CT report for description of findings in the chest. Disc levels: No significant disc herniation or spinal canal stenosis. Right-greater-than-left facet arthropathy at T11-12 results in moderate right neural foraminal narrowing. MRI LUMBAR SPINE FINDINGS Segmentation:  Standard. Alignment:  Normal. Vertebrae: Congenitally short pedicles throughout the lumbar spine. Heterogeneously enhancing lesion along the right aspect of the inferior endplate of L4, possibly an acute Schmorl's node versus site of metastasis. Enhancing lesion in the spinous process of L5. Enhancing lesion in the left posterior elements of T12 with mild extension into the left dorsal epidural space (axial image 2 series 4 and 7) and mild paraspinal extension. Enhancing lesions in the right sacral ala at the S1 level and right iliac crest. Conus medullaris: Extends to the L1-2 level and appears normal. No abnormal enhancement of the cauda equina nerve roots. Paraspinal and other soft tissues: No acute findings. See recent body CT report for description of intra-abdominal and pelvic findings. Disc levels: Disc bulge and facet arthropathy results in severe spinal canal stenosis at L4-5. Left eccentric disc bulge compresses the traversing left S1 nerve root in the subarticular zone at L5-S1. IMPRESSION: 1. Multiple enhancing lesions in the thoracic and lumbar spine, right sacral ala, and right iliac crest, consistent with metastatic disease. 2. Enhancing lesion in the left posterior elements of  T12 with mild extension into the left dorsal epidural space and mild paraspinal extension. No significant spinal canal stenosis. 3. Severe spinal canal stenosis at L4-5. 4. Left eccentric disc bulge compresses the traversing left S1 nerve root in the subarticular zone at L5-S1. Electronically Signed   By: Orvan Falconer M.D.   On: 07/18/2023 15:11   CT CHEST ABDOMEN PELVIS W CONTRAST  Result Date: 07/17/2023 CLINICAL DATA:  Cholangiocarcinoma restaging. Prior chemotherapy and radiation therapy. * Tracking Code: BO * EXAM: CT CHEST, ABDOMEN, AND PELVIS WITH CONTRAST TECHNIQUE: Multidetector CT imaging of the chest, abdomen and pelvis was performed following the standard protocol during bolus administration of intravenous contrast. RADIATION DOSE REDUCTION: This exam was performed according to the departmental dose-optimization program which includes automated exposure control, adjustment of the mA and/or kV according to patient size and/or use of iterative reconstruction technique. CONTRAST:  ISOVUE-300 IOPAMIDOL (ISOVUE-300) INJECTION 61% COMPARISON:  04/25/2023 FINDINGS: CT CHEST FINDINGS Cardiovascular: Right Port-A-Cath tip: Lower SVC. Atheromatous vascular calcification of the thoracic aorta and coronary arteries. Borderline cardiomegaly. Mediastinum/Nodes: Stable left thyroid nodule 3.5 cm. In the setting of significant comorbidities or limited life expectancy, no follow-up recommended (ref: J Am Coll Radiol. 2015 Feb;12(2): 143-50).Right paratracheal node 0.8 cm in short axis, formerly 1.1 cm, image 18 series 3. Another right paratracheal lymph node measures 0.8 cm on image 23 series 3, formerly 1.0 cm. Additional upper normal sized subcarinal infrahilar lymph nodes. No current overt pathologic adenopathy in the chest. Lungs/Pleura: Mild biapical pleuroparenchymal scarring. Mild scarring in the posterior basal segments of both lower lobes. Stable lingular scarring. The patchy opacities in the  lingula and left lower lobe shown on the prior exam have resolved. Musculoskeletal:  Enlarging lytic lesion of the T4 vertebral body eccentric to the right, 3.0 cm in long axis on image 107 series 8, formerly 1.9 cm in long axis. Enlarging lytic lesion in the T9 vertebral body. Essentially stable sclerotic lesion in the sternal body. New lytic lesion, cannot exclude pathologic fracture, posterolaterally in the ninth rib, image 130 series 5. Healing fracture of the right eighth rib laterally, nonspecific for underlying pathologic fracture, this could simply be a benign subacute rib fracture. Enlarging mixed lytic and sclerotic lesion of the left scapula, image 19 series 5. CT ABDOMEN PELVIS FINDINGS Hepatobiliary: Dominant right hepatic lobe mass measures 7.4 by 5.8 cm on image 65 series 3, formerly 7.1 by 6.8 cm, roughly stable. Other stable tiny hypodense lesions in the right hepatic lobe and in segment 2 of the left hepatic lobe, nonspecific. Prominent caudate lobe, suspicion for early cirrhosis. Gallbladder unremarkable. No biliary dilatation. Patent portal vein. Pancreas: Unremarkable Spleen: Spleen measures 13.1 by 8.0 by 12.8 cm (volume = 700 cm^3), compatible with splenomegaly. No focal splenic lesion identified. Adrenals/Urinary Tract: Stable benign renal cystic lesions including the lesion with calcified septations in the right upper lobe compatible with benign Bosniak category 2 cyst based on previous and current imaging workup assessments. No further imaging workup of these lesions is indicated. Adrenal glands normal. Left distal ureter an urinary bladder partially obscured by streak artifact from the patient's left hip implant. No hydronephrosis or hydroureter. Stomach/Bowel: Focal high density in the small bowel at 1.2 cm on image 116 series 3, probably a high density pill fragment, less likely to be an intra lith. Otherwise unremarkable. Vascular/Lymphatic: Atherosclerosis is present, including  aortoiliac atherosclerotic disease. Right gastric lymph nodes observed, including a 2.7 by 1.3 cm node on image 66 of series 3, previously 3.7 by 1.7 cm by my measurements, accordingly moderately reduced in size from previous. Aortocaval node 1.1 cm in short axis on image 81 series 3, formerly same. Other scattered smaller retroperitoneal and mesenteric lymph nodes are present. Reproductive: Mild prostatomegaly. Prostate gland partially obscured by streak artifact from the patient's left hip implant. Other: Mild ascites and scattered mesenteric edema. Confluent left upper quadrant omental edema without overt omental nodularity. Musculoskeletal: Enlarging lytic metastatic lesions including the left T12 pedicle, the right L4 inferior endplate, the L5 spinous process, the right hip quadrilateral plate, the bilateral ischium and the left posterior acetabulum. Mild increase in size of the sclerotic lesion in the right iliac bone on image 105 series 3. Cannot exclude epidural extension of tumor adjacent to the T12 posterior element involvement. IMPRESSION: 1. Mixed response to therapy. The dominant right hepatic lobe mass is roughly stable in size. The right gastric lymph node has moderately reduced in size. The right paratracheal lymph nodes have decreased in size. However, there are multiple enlarging lytic lesions in the axial and appendicular skeleton, including the T4 vertebral body, the T9 vertebral body, the left scapula, the right hip quadrilateral plate, the bilateral ischium, and the left posterior acetabulum. Cannot exclude epidural extension of tumor adjacent to the T12 posterior element involvement. 2. Healing fracture of the right eighth rib laterally, nonspecific for underlying pathologic fracture, this could simply be a benign subacute rib fracture. 3. Mild ascites and scattered mesenteric edema. Confluent left upper quadrant omental edema without overt omental nodularity. 4. Splenomegaly. 5. Mild  prostatomegaly. 6. Aortic atherosclerosis. 7. The patchy opacities in the lingula and left lower lobe shown on the prior exam have resolved. Aortic Atherosclerosis (ICD10-I70.0). Electronically Signed  By: Gaylyn Rong M.D.   On: 07/17/2023 11:31   DG Chest 2 View  Result Date: 07/06/2023 CLINICAL DATA:  cough, green sputum EXAM: CHEST - 2 VIEW COMPARISON:  February 11, 2023, April 25, 2023 FINDINGS: The cardiomediastinal silhouette is unchanged in contour.RIGHT chest port with tip terminating over the superior cavoatrial junction. No pleural effusion. No pneumothorax. Similar LEFT basilar coarse reticular prominence, better evaluated on prior CT. No acute pleuroparenchymal abnormality. Visualized abdomen is unremarkable. Multilevel degenerative changes of the thoracic spine. IMPRESSION: Similar LEFT basilar coarse reticular prominence, better evaluated on prior CT. No new focal airspace opacity. Electronically Signed   By: Meda Klinefelter M.D.   On: 07/06/2023 16:49

## 2023-07-18 NOTE — Patient Instructions (Addendum)
Pre visit review using our clinic review tool, if applicable. No additional management support is needed unless otherwise documented below in the visit note.  Increase dose today to take 1 1/2 tablets and then continue 1 tablet daily except take 1/2 tablet on Monday, Wednesday and Friday. Recheck on 9/25 at oncology infusion center.

## 2023-07-18 NOTE — Progress Notes (Signed)
Pt here for follow up. Reports that he is on amoxicillin for congestion.

## 2023-07-18 NOTE — Addendum Note (Signed)
Addended by: Mickel Crow H on: 07/18/2023 09:57 AM   Modules accepted: Orders

## 2023-07-18 NOTE — Assessment & Plan Note (Signed)
And cardiomyopathy, follow up with cardiology Rate controlled. S/p ablation on 03/26/23

## 2023-07-18 NOTE — Progress Notes (Signed)
Pt had INR lab draw today at oncology infusion center.  Increase dose today to take 1 1/2 tablets and then continue 1 tablet daily except take 1/2 tablet on Monday, Wednesday and Friday. Recheck on 9/25 at oncology infusion center.   LVM and advised of dosing Sent msg to oncology requesting lab INR on 9/25.

## 2023-07-18 NOTE — Assessment & Plan Note (Signed)
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 [facility] 2nd opinion was reviewed.  Recommend to proceed with 3rd line treatment with FOLFOX  Labs are reviewed and discussed with patient. CT scan results were discussed with patient, mixed response.  Shared decision was made to resume chemotherapy with dose reduced FOLFOX [Oxaliplatin 65mg /m2, omit 5-FU bolus]  next week.

## 2023-07-18 NOTE — Assessment & Plan Note (Signed)
Due to poor oral intake secondary to decreased appetite.  He feels that appetite is coming back today.  I recommend appetite stimulants which he would like to hold off for now. Continue nutritional supplementation. Follow with nutritionist.

## 2023-07-19 ENCOUNTER — Ambulatory Visit: Payer: BC Managed Care – PPO

## 2023-07-19 ENCOUNTER — Other Ambulatory Visit: Payer: BC Managed Care – PPO

## 2023-07-19 ENCOUNTER — Ambulatory Visit: Payer: BC Managed Care – PPO | Admitting: Oncology

## 2023-07-21 DIAGNOSIS — C221 Intrahepatic bile duct carcinoma: Secondary | ICD-10-CM | POA: Diagnosis not present

## 2023-07-22 ENCOUNTER — Ambulatory Visit: Payer: BC Managed Care – PPO | Admitting: Oncology

## 2023-07-22 ENCOUNTER — Other Ambulatory Visit: Payer: BC Managed Care – PPO

## 2023-07-22 ENCOUNTER — Ambulatory Visit: Payer: BC Managed Care – PPO

## 2023-07-22 MED FILL — Fluorouracil IV Soln 5 GM/100ML (50 MG/ML): INTRAVENOUS | Qty: 115 | Status: AC

## 2023-07-23 MED FILL — Dexamethasone Sodium Phosphate Inj 100 MG/10ML: INTRAMUSCULAR | Qty: 1 | Status: AC

## 2023-07-24 ENCOUNTER — Inpatient Hospital Stay (HOSPITAL_BASED_OUTPATIENT_CLINIC_OR_DEPARTMENT_OTHER): Payer: BC Managed Care – PPO | Admitting: Oncology

## 2023-07-24 ENCOUNTER — Ambulatory Visit
Admission: RE | Admit: 2023-07-24 | Discharge: 2023-07-24 | Disposition: A | Discharge status: Home / Self Care | Payer: BC Managed Care – PPO | Source: Ambulatory Visit | Attending: Radiation Oncology | Admitting: Radiation Oncology

## 2023-07-24 ENCOUNTER — Encounter: Payer: BC Managed Care – PPO | Admitting: Hospice and Palliative Medicine

## 2023-07-24 ENCOUNTER — Inpatient Hospital Stay: Payer: BC Managed Care – PPO

## 2023-07-24 ENCOUNTER — Encounter: Payer: Self-pay | Admitting: Oncology

## 2023-07-24 VITALS — BP 153/66 | HR 61 | Temp 97.7°F | Resp 18

## 2023-07-24 VITALS — BP 105/56 | HR 61 | Temp 98.0°F | Resp 18 | Wt 243.9 lb

## 2023-07-24 DIAGNOSIS — C221 Intrahepatic bile duct carcinoma: Secondary | ICD-10-CM

## 2023-07-24 DIAGNOSIS — E876 Hypokalemia: Secondary | ICD-10-CM | POA: Diagnosis not present

## 2023-07-24 DIAGNOSIS — I4891 Unspecified atrial fibrillation: Secondary | ICD-10-CM | POA: Diagnosis not present

## 2023-07-24 DIAGNOSIS — D6861 Antiphospholipid syndrome: Secondary | ICD-10-CM

## 2023-07-24 DIAGNOSIS — R630 Anorexia: Secondary | ICD-10-CM | POA: Diagnosis not present

## 2023-07-24 DIAGNOSIS — C7951 Secondary malignant neoplasm of bone: Secondary | ICD-10-CM | POA: Diagnosis not present

## 2023-07-24 DIAGNOSIS — R634 Abnormal weight loss: Secondary | ICD-10-CM | POA: Diagnosis not present

## 2023-07-24 DIAGNOSIS — Z7901 Long term (current) use of anticoagulants: Secondary | ICD-10-CM | POA: Diagnosis not present

## 2023-07-24 DIAGNOSIS — Z5111 Encounter for antineoplastic chemotherapy: Secondary | ICD-10-CM | POA: Diagnosis not present

## 2023-07-24 DIAGNOSIS — I480 Paroxysmal atrial fibrillation: Secondary | ICD-10-CM

## 2023-07-24 DIAGNOSIS — Z79899 Other long term (current) drug therapy: Secondary | ICD-10-CM | POA: Diagnosis not present

## 2023-07-24 DIAGNOSIS — I429 Cardiomyopathy, unspecified: Secondary | ICD-10-CM | POA: Diagnosis not present

## 2023-07-24 DIAGNOSIS — Z23 Encounter for immunization: Secondary | ICD-10-CM | POA: Diagnosis not present

## 2023-07-24 LAB — CBC WITH DIFFERENTIAL (CANCER CENTER ONLY)
Abs Immature Granulocytes: 0.01 10*3/uL (ref 0.00–0.07)
Basophils Absolute: 0 10*3/uL (ref 0.0–0.1)
Basophils Relative: 1 %
Eosinophils Absolute: 0 10*3/uL (ref 0.0–0.5)
Eosinophils Relative: 0 %
HCT: 31.9 % — ABNORMAL LOW (ref 39.0–52.0)
Hemoglobin: 10.1 g/dL — ABNORMAL LOW (ref 13.0–17.0)
Immature Granulocytes: 0 %
Lymphocytes Relative: 6 %
Lymphs Abs: 0.2 10*3/uL — ABNORMAL LOW (ref 0.7–4.0)
MCH: 30.3 pg (ref 26.0–34.0)
MCHC: 31.7 g/dL (ref 30.0–36.0)
MCV: 95.8 fL (ref 80.0–100.0)
Monocytes Absolute: 0.5 10*3/uL (ref 0.1–1.0)
Monocytes Relative: 14 %
Neutro Abs: 3 10*3/uL (ref 1.7–7.7)
Neutrophils Relative %: 79 %
Platelet Count: 112 10*3/uL — ABNORMAL LOW (ref 150–400)
RBC: 3.33 MIL/uL — ABNORMAL LOW (ref 4.22–5.81)
RDW: 16.5 % — ABNORMAL HIGH (ref 11.5–15.5)
WBC Count: 3.8 10*3/uL — ABNORMAL LOW (ref 4.0–10.5)
nRBC: 0 % (ref 0.0–0.2)

## 2023-07-24 LAB — CMP (CANCER CENTER ONLY)
ALT: 17 U/L (ref 0–44)
AST: 34 U/L (ref 15–41)
Albumin: 2.8 g/dL — ABNORMAL LOW (ref 3.5–5.0)
Alkaline Phosphatase: 104 U/L (ref 38–126)
Anion gap: 6 (ref 5–15)
BUN: 7 mg/dL — ABNORMAL LOW (ref 8–23)
CO2: 25 mmol/L (ref 22–32)
Calcium: 8.6 mg/dL — ABNORMAL LOW (ref 8.9–10.3)
Chloride: 101 mmol/L (ref 98–111)
Creatinine: 0.77 mg/dL (ref 0.61–1.24)
GFR, Estimated: >60 mL/min (ref >60)
Glucose, Bld: 131 mg/dL — ABNORMAL HIGH (ref 70–99)
Potassium: 3.7 mmol/L (ref 3.5–5.1)
Sodium: 132 mmol/L — ABNORMAL LOW (ref 135–145)
Total Bilirubin: 0.9 mg/dL (ref 0.3–1.2)
Total Protein: 5.6 g/dL — ABNORMAL LOW (ref 6.5–8.1)

## 2023-07-24 LAB — PROTIME-INR
INR: 2.2 — ABNORMAL HIGH (ref 0.8–1.2)
Prothrombin Time: 24.7 seconds — ABNORMAL HIGH (ref 11.4–15.2)

## 2023-07-24 MED ORDER — LEUCOVORIN CALCIUM INJECTION 350 MG
400.0000 mg/m2 | Freq: Once | INTRAVENOUS | Status: AC
  Administered 2023-07-24: 956 mg via INTRAVENOUS
  Filled 2023-07-24: qty 47.8

## 2023-07-24 MED ORDER — SODIUM CHLORIDE 0.9 % IV SOLN
10.0000 mg | Freq: Once | INTRAVENOUS | Status: AC
  Administered 2023-07-24: 10 mg via INTRAVENOUS
  Filled 2023-07-24: qty 10

## 2023-07-24 MED ORDER — SODIUM CHLORIDE 0.9 % IV SOLN
2400.0000 mg/m2 | INTRAVENOUS | Status: DC
  Administered 2023-07-24: 5750 mg via INTRAVENOUS
  Filled 2023-07-24: qty 115

## 2023-07-24 MED ORDER — OXALIPLATIN CHEMO INJECTION 100 MG/20ML
65.0000 mg/m2 | Freq: Once | INTRAVENOUS | Status: AC
  Administered 2023-07-24: 150 mg via INTRAVENOUS
  Filled 2023-07-24: qty 10

## 2023-07-24 MED ORDER — DEXTROSE 5 % IV SOLN
Freq: Once | INTRAVENOUS | Status: AC
  Filled 2023-07-24: qty 250

## 2023-07-24 MED ORDER — PALONOSETRON HCL INJECTION 0.25 MG/5ML
0.2500 mg | Freq: Once | INTRAVENOUS | Status: AC
  Administered 2023-07-24: 0.25 mg via INTRAVENOUS
  Filled 2023-07-24: qty 5

## 2023-07-24 NOTE — Patient Instructions (Signed)
[place] [facility]  Discharge Instructions: Thank you for choosing [facility] to provide your oncology and hematology care.  If you have a lab appointment with the Cancer Center, please go directly to the Cancer Center and check in at the registration area.  Wear comfortable clothing and clothing appropriate for easy access to any Portacath or PICC line.   We strive to give you quality time with your provider. You may need to reschedule your appointment if you arrive late (15 or more minutes).  Arriving late affects you and other patients whose appointments are after yours.  Also, if you miss three or more appointments without notifying the office, you may be dismissed from the clinic at the provider's discretion.      For prescription refill requests, have your pharmacy contact our office and allow 72 hours for refills to be completed.    Today you received the following chemotherapy and/or immunotherapy agents : folfox     To help prevent nausea and vomiting after your treatment, we encourage you to take your nausea medication as directed.  BELOW ARE SYMPTOMS THAT SHOULD BE REPORTED IMMEDIATELY: *FEVER GREATER THAN 100.4 F (38 C) OR HIGHER *CHILLS OR SWEATING *NAUSEA AND VOMITING THAT IS NOT CONTROLLED WITH YOUR NAUSEA MEDICATION *UNUSUAL SHORTNESS OF BREATH *UNUSUAL BRUISING OR BLEEDING *URINARY PROBLEMS (pain or burning when urinating, or frequent urination) *BOWEL PROBLEMS (unusual diarrhea, constipation, pain near the anus) TENDERNESS IN MOUTH AND THROAT WITH OR WITHOUT PRESENCE OF ULCERS (sore throat, sores in mouth, or a toothache) UNUSUAL RASH, SWELLING OR PAIN  UNUSUAL VAGINAL DISCHARGE OR ITCHING   Items with * indicate a potential emergency and should be followed up as soon as possible or go to the Emergency Department if any problems should occur.  Please show the CHEMOTHERAPY ALERT CARD or IMMUNOTHERAPY ALERT CARD at check-in to  the Emergency Department and triage nurse.  Should you have questions after your visit or need to cancel or reschedule your appointment, please contact [place] [facility]  [phone] and follow the prompts.  Office hours are 8:00 a.m. to 4:30 p.m. Monday - Friday. Please note that voicemails left after 4:00 p.m. may not be returned until the following business day.  We are closed weekends and major holidays. You have access to a nurse at all times for urgent questions. Please call the main number to the clinic [phone] and follow the prompts.  For any non-urgent questions, you may also contact your provider using MyChart. We now offer e-Visits for anyone 66 and older to request care online for non-urgent symptoms. For details visit [email or web address].   Also download the MyChart app! Go to the app store, search "MyChart", open the app, select [facility], and log in with your MyChart username and password.

## 2023-07-24 NOTE — Progress Notes (Signed)
Pt here for follow up. Reports that he missed a dose of coumadin last week

## 2023-07-24 NOTE — Assessment & Plan Note (Signed)
And cardiomyopathy, follow up with cardiology Rate controlled. S/p ablation on 03/26/23

## 2023-07-24 NOTE — Assessment & Plan Note (Signed)
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 [facility] 2nd opinion was reviewed.  Recommend to proceed with 3rd line treatment with FOLFOX, CT showed mixed response after 3 cycles, he tolerated with moderate/severe difficulties.  Labs are reviewed and discussed with patient. Proceed with dose reduced FOLFOX today - [oxaliplatin 65mg /m2, omit 5-FU bolus]

## 2023-07-24 NOTE — Assessment & Plan Note (Signed)
#  Recurrent lower extremity DVT, secondary to antiphospholipid syndrome.-. Need  life time anticoagulation Currently on coumadin with INR goal of 2-3.   INR pending  INR result will be sent to PCP's office for coumadin dosing.

## 2023-07-24 NOTE — Progress Notes (Signed)
Radiation Oncology Follow up Note old patient new area T12 met  Name: Christopher Marsh   Date:   07/24/2023 MRN:  [id number] DOB: 03-06-1961    This 62 y.o. male presents to the clinic today for evaluation of metastatic disease to T12 and patient with known stage IV metastatic cholangiocarcinoma undergoing prior palliative treatment.  REFERRING PROVIDER: Nelwyn Salisbury, MD  HPI: Patient is a 62 year old male well-known to our department.  He is previous been treated to his right hip as well as left ischium for metastatic stage IV cholangiocarcinoma.  He had a fall recently still has some pain in his right hip.  He was noted on recent MRI scans of his thoracic lumbar spine.  To have multiple enhancing lesions in both the thoracic lumbar spine right sacral alla and right iliac crest consistent with no metastatic disease.  He did have enhancing lesion in the posterior elements of T12 with extension into the left dorsal epidural space and mild paraspinal extension.  I been asked evaluate him for palliative radiation therapy to this area.  He is not having any focal neurologic deficits.  He ambulates with the assistance of a cane and a walker at home  COMPLICATIONS OF TREATMENT: none  FOLLOW UP COMPLIANCE: keeps appointments   PHYSICAL EXAM:  There were no vitals taken for this visit. Patient is seen in the infusion center.  Motor and sensory levels in lower extremities are equal and symmetric proprioception appears intact.  Well-developed well-nourished patient in NAD. HEENT reveals PERLA, EOMI, discs not visualized.  Oral cavity is clear. No oral mucosal lesions are identified. Neck is clear without evidence of cervical or supraclavicular adenopathy. Lungs are clear to A&P. Cardiac examination is essentially unremarkable with regular rate and rhythm without murmur rub or thrill. Abdomen is benign with no organomegaly or masses noted. Motor sensory and DTR levels are equal and symmetric in the upper and  lower extremities. Cranial nerves II through XII are grossly intact. Proprioception is intact. No peripheral adenopathy or edema is identified. No motor or sensory levels are noted. Crude visual fields are within normal range.  RADIOLOGY RESULTS: MRI scans of the lumbar and thoracic spine reviewed compatible with above-stated findings  PLAN: This time I will offer palliative radiation therapy to his T11-L1.  We use IMRT treatment planning and delivery to do a short course of palliative radiation therapy in 5 fractions.  Will treat up to 30 Gray.  Risks and benefits of treatment clued possible diarrhea possible skin reaction fatigue all were reviewed with the patient.  I have's personally set him up for simulation next week.  I would like to take this opportunity to thank you for allowing me to participate in the care of your patient.Carmina Miller, MD

## 2023-07-24 NOTE — Assessment & Plan Note (Addendum)
Recommend patient continue potassium chloride supplementation.

## 2023-07-24 NOTE — Assessment & Plan Note (Signed)
S/p palliative  radiation  Consider bisphosphonate after dental clearance is obtained. S/p left ischium.  radiation due to concern of pathological fracture.  Right thigh pain, no RT per Radonc. Felt was due to arthritis.  CT showed worse of bone metastatic disease, T12 epidural tumor extension- MRI thoracic and lumbar spine showed T12 epidural paraspinal tumor extension.  Follow up with Radonc for radiation of  T12

## 2023-07-24 NOTE — Progress Notes (Signed)
Nutrition Follow-up:   Patient with cholangiocarcinoma.  Patient receiving 3rd line folfox.    Met with patient during infusion.  Reports that he has felt great and appetite has been good.  Weight has increased.  Unsure how he will feel following treatment today.  MD planning dose reduction.  Respiratory infection has cleared.  Says that he has been drinking boost shakes and protein bars.      Medications: reviewed  Labs: reviewed  Anthropometrics:   Weight 243 lb today 238 lb on 9/19 258 lb on 7/29 275 lb on 6/13 287 lb on 5/29  NUTRITION DIAGNOSIS: Unintentional weight loss continues    INTERVENTION:  Continue boost shakes.  Planning to mix ice cream and ice to make milkshake Continue high calorie, high protein foods to maintain weight    MONITORING, EVALUATION, GOAL: weight trends, intake   NEXT VISIT: Wednesday, Oct 9 during infusion  Kiala Faraj B. Freida Busman, RD, LDN Registered Dietitian [phone]

## 2023-07-24 NOTE — Progress Notes (Signed)
Hematology/Oncology Progress note [phone] [phone]    CHIEF COMPLAINTS/REASON FOR VISIT:  Follow up for right lower extremity DVT, antiphospholipid syndrome, metastatic cholangiocarcinoma.  ASSESSMENT & PLAN:   Cancer Staging  [facility]) Staging form: Intrahepatic Bile Duct, AJCC 8th Edition - Clinical stage from 08/27/2022: Stage IV (cT2, cN1, cM1) - Signed by [name], MD on 09/13/2022   Cholangiocarcinoma (HCC) Cholangiocarcinoma, NGS-Tempus liquid biopsy FGFR2 -ADK mutation, TMB 0,  S/p Gemcitabine, Cisplatin and Durvalumab x 6 , 01/2023 CT progression/enlarged liver lesions --03/03/23 2nd line Futibatinib- 04/26/23 CT showed interval progression  Recommend patient to stop Futibatinib. [facility] 2nd opinion was reviewed.  Recommend to proceed with 3rd line treatment with FOLFOX, CT showed mixed response after 3 cycles, he tolerated with moderate/severe difficulties.  Labs are reviewed and discussed with patient. Proceed with dose reduced FOLFOX today - [oxaliplatin 65mg /m2, omit 5-FU bolus]    Antiphospholipid syndrome (HCC) #Recurrent lower extremity DVT, secondary to antiphospholipid syndrome.-. Need  life time anticoagulation Currently on coumadin with INR goal of 2-3.   INR pending  INR result will be sent to PCP's office for coumadin dosing.    Hypocalcemia Continue calcium supplementation, currently on 1000 mg twice daily.   Recommend Vitamin D 1000 units daily.   Metastasis to bone [facility]) S/p palliative  radiation  Consider bisphosphonate after dental clearance is obtained. S/p left ischium.  radiation due to concern of pathological fracture.  Right thigh pain, no RT per Radonc. Felt was due to arthritis.  CT showed worse of bone metastatic disease, T12 epidural tumor extension- MRI thoracic and lumbar spine showed T12 epidural paraspinal tumor extension.  Follow up with Radonc for radiation of   T12   Hypokalemia Recommend patient continue potassium chloride supplementation.   Atrial fibrillation (HCC) And cardiomyopathy, follow up with cardiology Rate controlled. S/p ablation on 03/26/23    Orders Placed This Encounter  Procedures   Protime-INR    Standing Status:   Future    Standing Expiration Date:   08/13/2024   Cancer antigen 19-9    Standing Status:   Future    Standing Expiration Date:   08/13/2024   CEA    Standing Status:   Future    Standing Expiration Date:   08/13/2024   CBC with Differential (Cancer Center Only)    Standing Status:   Future    Standing Expiration Date:   08/13/2024   CMP (Cancer Center only)    Standing Status:   Future    Standing Expiration Date:   08/13/2024   Follow up 2 weeks lab MD chemo  All questions were answered. The patient knows to call the clinic with any problems, questions or concerns.  [name], MD, PhD [facility] Hematology Oncology 07/24/2023    HISTORY OF PRESENTING ILLNESS:  Patient reports remote history of left lower extremity DVT in 2002.  He was initiated on Lovenox and bridged to Coumadin.  Patient took warfarin for 2 years before anticoagulation was stopped. 12/03/2021, patient presented to emergency room for evaluation of right lower extremity pain and swelling for about a week.  Started on the inner side of right thigh and migrated to the right calf. + Associated with swelling.  Denies any recent injury, hospitalization, surgery.  He first noticed the symptoms after playing basketball with his grandson.   12/03/2021, right lower extremity ultrasound showed occlusive DVT extending from the mid aspect of the femoral vein through the imaged tibial vein.  Age-indeterminate. Patient was  started on Xarelto. He was referred to establish care with vascular surgeon and was seen by Sheppard Plumber on 12/06/2021.  Shared decision was made not to proceed with embolectomy.  Continue anticoagulation. Patient was referred to  hematology oncology for further evaluation.  Patient denies any family history of blood clots.  Denies any unintentional weight loss, fever or night sweats. 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  He sometimes stay in his car while waiting for next assessment appointment.  He reports the right lower extremity symptom has improved since the start of Xarelto.  No active bleeding events.  #12/18/2021 hypercoagulable work-up showed JAK2 V617F mutation negative, with reflex to other mutations CALR, MPL, JAK 2 Ex 12-15 mutations negative, negative anticardiolipin IgG and IgM antibodies, positive lupus anticoagulant, negative factor V Leiden mutation, negative prothrombin gene mutation, normal protein C activity, normal protein S antigen level. Patient was recommended to switch to Coumadin with INR goal of 2-3.  #03/15/2022, repeat lupus anticoagulant is persistently positive- + antiphospholipid syndrome.  Patient is currently on Lovenox 1 mg/kg twice daily.  admitted due to multifocal pneumonia, treated with Influenza panel negative, COVID-negative, HIV negative, Complete course of antibiotics with Zithromax, Vantin   + HCV antibody positive, HCV RNA quantification not detected. HCV RT PCR not detected.   INTERVAL HISTORY KIJUAN FULLMER is a 62 y.o. male who has above history reviewed by me today presents for follow up visit for management of right lower extremity DVT and antiphospholipid syndrome, metastatic cholangiocarcinoma.  Oncology History  Cholangiocarcinoma (HCC)  10/30/2021 Imaging   CT chest wo contrast 1. Heterogeneous bilateral ground-glass disease, multilobar but worst in the right middle lobe, lingula and lower lobes. There is progression of the abnormality since 10/12/2022. Though pneumonia could produce this appearance, given persistence and fluctuating appearance since October, consider  alternative entities such as hypersensitivity pneumonitis or idiopathic interstitial pneumonia. 2. Slightly enlarged pulmonary trunk, possible arterial hypertension 3. Redemonstrated large hypodense right hepatic lobe mass.   08/18/2022 Imaging   CT chest w contrast showed 1. Multifocal airspace disease in both lungs, left side greater than right. Findings are most compatible with multifocal pneumonia. 2. Multiple new hepatic lesions with enlarging lymph nodes in the upper abdomen and chest. Findings are concerning for metastatic disease. Based on the multifocal pneumonia, hepatic abscesses would also be in the differential diagnosis. Recommend further characterization of the abdomen and pelvis with CT with IV contrast. 3. Enlargement of the main pulmonary artery could be associated with pulmonary hypertension. 4. Coronary artery calcifications. 5. Multinodular goiter. Patient has known thyroid nodules and previous thyroid ultrasound.    08/18/2022 Imaging   CT abdomen pelvis w contrast  Interval development of 6.5 x 5.2 cm heterogeneously enhancing mass in right hepatic lobe. This is highly concerning for neoplasm or malignancy, and further evaluation with MRI is recommended.   Mildly enlarged periaortic adenopathy is noted, with the largest lymph node measuring 12 mm. Metastatic disease cannot be excluded. Mildly enlarged adenopathy is also noted in the gastrohepatic ligament, but this is unchanged compared to prior exam.   Stable bibasilar lung opacities are noted concerning for inflammation, atelectasis or possibly scarring.   Grossly stable multi-septated cystic lesion is seen involving upper pole of right kidney with peripheral calcifications compared to prior exam of 2019, most likely representing benign etiology.   Aortic Atherosclerosis    08/18/2022 - 08/21/2022 Hospital Admission   Admitted  due to multifocal pneumonia, treated with Influenza panel negative, COVID-negative, HIV  negative, Complete course of antibiotics with Zithromax, Vantin     08/19/2022 Imaging   MR abdomen w wo contrast  Marked caudate lobe hypertrophy, highly suspicious for hepatic cirrhosis.   Numerous hypervascular masses throughout the right hepatic lobe, highly suspicious for multifocal hepatocellular carcinoma. Recommend correlation with AFP and consider tissue sampling.   Mild abdominal lymphadenopathy, with differential diagnosis including metastatic disease and reactive lymphadenopathy in setting of cirrhosis.   Bilateral lower lobe infiltrates, as better demonstrated on recent CT.    08/19/2022 Tumor Marker   AFP 4.1   08/27/2022 Initial Diagnosis   Cholangiocarcinoma  -08/20/22 Liver mass biopsy showed poorly differentiated carcinoma Immunohistochemical stains show that the poorly differentiated carcinoma is positive for CK7 and MOC-31, suggestive of a poorly differentiated adenocarcinoma.  Immunohistochemical stains for CK20, CDX2, HepPar 1 and arginase are negative.  Immunostain for glypican-3 shows very focal  likely nonspecific labeling.  This immunoprofile is nonspecific but can be compatible with a poorly differentiated primary cholangiocarcinoma in absence of any other lesions.   NGS showed BAP 1 mutation.  Case was presented at tumor board, I have also discussed with pathologist Dr.Rubinas,  IHC pattern is more consistent with adenocarcinoma, unlikely HCC [due to negative HepPar 1 and arginase], cholangiocarcinoma is favored, although this is a diagnosis of exclusion. Second opinion ar [facility]   08/27/2022 Cancer Staging   Staging form: Intrahepatic Bile Duct, AJCC 8th Edition - Clinical stage from 08/27/2022: Stage IV (cT2, cN1, cM1) - Signed by [name], MD on 09/13/2022 Stage prefix: Initial diagnosis   08/27/2022 Tumor Marker   CA 19.9  16 CEA 2.8   09/12/2022 Imaging   PET scan showed 1. Large right hepatic lobe mass is hypermetabolic and consistent with known  cholangiocarcinoma. 2. Scattered borderline enlarged upper abdominal lymph nodes do not show any significant FDG uptake. 3. 4 hypermetabolic bone lesions consistent with metastatic disease. 4. Progressive diffuse airspace process in the left lung could be due to atypical pneumonia, pulmonary hemorrhage or left-sided aspiration. 5. Aortic atherosclerosis.  09/12/2022 I had a phone discussion with patient after his PET scan resulted.  Recommend systemic chemotherapy.  Patient would like to defer until his second opinion visit at [facility].   09/15/2022 Imaging   CT angio chest pulmonary embolism protocol showed 1. No evidence for pulmonary embolism. 2. Progression of bilateral multifocal patchy ground-glass and interstitial opacities, left greater than right. Findings are concerning for multifocal pneumonia. 3. Air-fluid levels in the left lower lobe bronchi worrisome for aspiration. 4. Mediastinal and hilar lymphadenopathy has increased in size and number when compared to the prior study, likely reactive.5. Cardiomegaly. 6. Findings compatible with pulmonary artery hypertension.7. Stable right liver mass and complex right renal cystic lesion. 8. Stable enlargement of the left thyroid.   09/15/2022 - 09/17/2022 Hospital Admission   Patient was admitted due to shortness of breath, CT showed progression of bilateral multifocal patchy groundglass and interstitial opacities.  Left greater than right.  Concerning for multifocal pneumonia.  Patient was started on broad-spectrum antibiotics with significant improvement.  He was discharged on oral antibiotics.   09/19/2022 Miscellaneous   Patient went to [facility] for second opinion.  He was seen by Shirlee Latch, who felt that the presentation is consistent with metastatic cholangiocarcinoma, and agrees with the plan of cisplatin/gemcitabine/durvalumab..    10/11/2022 - 01/31/2023 Chemotherapy   Patient is on Treatment Plan : Cisplatin D1,8 + Gemcitabine +  Durvalumab  D1,8 q21d x     10/12/2022 Imaging   CT chest w contrast 1. Bilateral airspace and ground-glass opacities have mildly decreased in amount and density when compared to the prior study. Findings are compatible with resolving multifocal pneumonia. Continued follow-up imaging recommended. 2. Stable enlarged heterogeneous left thyroid gland with 3.2 cm nodule. This can be further evaluated with dedicated ultrasound. 3. Stable hepatic and right renal lesions, incompletely evaluated.   Aortic Atherosclerosis (ICD10-I70.0).   12/11/2022 Imaging   PET showed  1. There is been mild increase in size of the tracer avid mass within right lobe of liver compatible with known cholangiocarcinoma. The degree of tracer uptake however is slightly decreased in the interval. 2. Multifocal tracer avid bone metastases are again noted. When compared with the previous exam there has been an mixed interval response to therapy. Specifically, there has been interval increase in size and FDG uptake associated with the right ischial tuberosity lesion and there is a new lesion within the posterior left iliac bone. Decreased tracer uptake is associated with the left scapular lesion and there is stable tracer uptake associated with the right T1 transverse process lesion. 3. Persistent ground-glass and airspace densities within the left upper lobe and left lower lobe compatible with inflammatory or infectious process. Interval resolution of previous right lung ground-glass and airspace densities.    12/24/2022 Procedure   Medi port placement by Dr. Wyn Quaker   01/22/2023 Imaging   CT chest abdomen pelvis with contrast 1. Hypodense lesion of the right lobe of the liver, hepatic segment VII/VIII is slightly diminished in size, consistent with treatment response. 2. No evident change in additional smaller hypodense lesions scattered throughout the right lobe of the liver, these more difficult to see on prior noncontrast  PET-CT. 3. Unchanged enlarged celiac axis, gastrohepatic ligament, and retroperitoneal lymph nodes. Unchanged prominent high right paratracheal lymph nodes. 4. Unchanged osseous metastases. 5. Slightly improved, although still extensive ground-glass airspace opacity primarily seen throughout the left lower lobe and lingula,nonspecific and infectious or inflammatory. 6. Coronary artery disease. 7. Emphysema.   02/11/2023 Imaging   Patient self decreased Lovenox dosage to 120mg  once daily.  Increased lower extremity swelling.   02/11/23 [place] bilateral lower extremity showed 1. Nonocclusive DVT within the popliteal veins bilaterally. 2. Occlusive DVT within the posterior tibial veins of the right calf.       02/13/2023 Imaging   CT chest Angiogram  1. Technically adequate exam showing no acute pulmonary embolus. 2. Cardiomegaly. Coronary artery calcifications. 3. Bilateral pleural effusions. 4. Improved aeration of the RIGHT UPPER and LOWER lobes compared to prior study. 5. LEFT LOWER lobe and lingula airspace filling and atelectasis, slightly improved compared to prior study. 6. Cirrhotic morphology of the liver. 7.7 centimeters mass in the RIGHT hepatic lobe. 7. Gastrohepatic ligament adenopathy. 8. A 7 millimeters sclerotic lesion in the UPPER sternum is suspicious for metastatic disease. 9. LEFT thyroid goiter.   02/13/2023 - 02/15/2023 Hospital Admission   A flutter with variable A-V block. Heart rate up to 140.  BNP 119.8 04/18: Echo no overt CHF - EF 50-55  Cardiology recs beta blocker to metoprolol succinate 50 mg bid, Torsemid 40mg  daily Adjusting Lovenox 14mg  BID.    03/03/2023 - 05/03/2023 Chemotherapy   Futibatinib 20mg , 03/12/23 Decreased to 16mg  daily Stopped in early July due to cancer progression.   04/24/2023 - 04/30/2023 Radiation Therapy   left ischium radiation.    05/20/2023 -  Chemotherapy   Patient is on Treatment Plan :  COLORECTAL FOLFOX q14d      INTERVAL  HISTORY ABRAHEM GOREN is a 62 y.o. male who has above history reviewed by me today presents for follow up visit for Cholangiocarcinoma, antiphospholipid syndrome, thrombosis + Fatigue, Appetite is better, gained weight.  He follows up with PCP'f office for coumadin dosing.  Denies any bleeding events.   Review of Systems  Constitutional:  Positive for fatigue and unexpected weight change. Negative for appetite change, chills and fever.  HENT:   Negative for hearing loss and voice change.   Eyes:  Negative for eye problems.  Respiratory:  Negative for chest tightness, cough and shortness of breath.   Cardiovascular:  Positive for leg swelling. Negative for chest pain.  Gastrointestinal:  Negative for abdominal distention, abdominal pain, blood in stool and diarrhea.  Endocrine: Negative for hot flashes.  Genitourinary:  Negative for difficulty urinating and frequency.   Musculoskeletal:  Negative for arthralgias.  Skin:  Negative for itching.  Neurological:  Negative for extremity weakness.  Hematological:  Negative for adenopathy. Bruises/bleeds easily.  Psychiatric/Behavioral:  Negative for confusion.     MEDICAL HISTORY:  Past Medical History:  Diagnosis Date   Antiphospholipid antibody syndrome (HCC)    Arthritis    Cancer (HCC)    Diabetes mellitus without complication (HCC)    DVT (deep venous thrombosis) (HCC)    Lt leg   Dyspnea    Elevated lipids    Erythrocytosis    Hyperlipidemia    Hypertension    Lower extremity edema    Multinodular thyroid    Post-thrombotic syndrome     SURGICAL HISTORY: Past Surgical History:  Procedure Laterality Date   CARDIOVERSION N/A 03/22/2023   Procedure: CARDIOVERSION;  Surgeon: Antonieta Iba, MD;  Location: [facility] ORS;  Service: Cardiovascular;  Laterality: N/A;   COLONOSCOPY WITH PROPOFOL N/A 09/23/2018   Per Dr. Norma Fredrickson, polyp, repeat in 3 yrs    EYE MUSCLE SURGERY     JOINT REPLACEMENT Left 07/22/2017   PORTA CATH  INSERTION N/A 12/24/2022   Procedure: PORTA CATH INSERTION;  Surgeon: Annice Needy, MD;  Location: [facility] INVASIVE CV LAB;  Service: Cardiovascular;  Laterality: N/A;   TOTAL HIP ARTHROPLASTY Left 07/22/2017   Procedure: TOTAL HIP ARTHROPLASTY;  Surgeon: Donato Heinz, MD;  Location: [facility] ORS;  Service: Orthopedics;  Laterality: Left;    SOCIAL HISTORY: Social History   Socioeconomic History   Marital status: Married    Spouse name: Not on file   Number of children: Not on file   Years of education: Not on file   Highest education level: Not on file  Occupational History   Not on file  Tobacco Use   Smoking status: Former    Current packs/day: 0.00    Average packs/day: 0.5 packs/day for 6.0 years (3.0 ttl pk-yrs)    Types: Cigarettes    Start date: 32    Quit date: 89    Years since quitting: 42.7   Smokeless tobacco: Never   Tobacco comments:    Pt states when he did smoke he smoked at most 1 ppd. ALS 09/26/2022  Vaping Use   Vaping status: Never Used  Substance and Sexual Activity   Alcohol use: Not Currently    Comment: occ   Drug use: No   Sexual activity: Not on file  Other Topics Concern   Not on file  Social History Narrative   Not on file   Social Determinants of Health   Financial Resource  Strain: Low Risk  (08/27/2022)   Overall Financial Resource Strain (CARDIA)    Difficulty of Paying Living Expenses: Not very hard  Food Insecurity: No Food Insecurity (02/18/2023)   Hunger Vital Sign    Worried About Running Out of Food in the Last Year: Never true    Ran Out of Food in the Last Year: Never true  Transportation Needs: No Transportation Needs (02/18/2023)   PRAPARE - Administrator, Civil Service (Medical): No    Lack of Transportation (Non-Medical): No  Physical Activity: Insufficiently Active (08/27/2022)   Exercise Vital Sign    Days of Exercise per Week: 3 days    Minutes of Exercise per Session: 30 min  Stress: Stress Concern  Present (08/27/2022)   [employer] of Occupational Health - Occupational Stress Questionnaire    Feeling of Stress : Rather much  Social Connections: Socially Integrated (08/27/2022)   Social Connection and Isolation Panel [NHANES]    Frequency of Communication with Friends and Family: More than three times a week    Frequency of Social Gatherings with Friends and Family: More than three times a week    Attends Religious Services: 1 to 4 times per year    Active Member of [employer] or Organizations: Yes    Attends Banker Meetings: Never    Marital Status: Married  Catering manager Violence: Not At Risk (02/14/2023)   Humiliation, Afraid, Rape, and Kick questionnaire    Fear of Current or Ex-Partner: No    Emotionally Abused: No    Physically Abused: No    Sexually Abused: No    FAMILY HISTORY: Family History  Problem Relation Age of Onset   Breast cancer Mother    Emphysema Father    Colon cancer Maternal Grandmother     ALLERGIES:  has No Known Allergies.  MEDICATIONS:  Current Outpatient Medications  Medication Sig Dispense Refill   albuterol (VENTOLIN HFA) 108 (90 Base) MCG/ACT inhaler TAKE 2 PUFFS BY MOUTH EVERY 4 HOURS AS NEEDED 8.5 each 1   Calcium Carbonate 500 MG CHEW Chew 2 tablets (1,000 mg total) by mouth daily. 60 tablet 0   diphenoxylate-atropine (LOMOTIL) 2.5-0.025 MG tablet Take 1 tablet by mouth 4 (four) times daily as needed for diarrhea or loose stools. 120 tablet 3   fentaNYL (DURAGESIC) 50 MCG/HR Place 1 patch onto the skin every 3 (three) days. 10 patch 0   glipiZIDE (GLUCOTROL) 10 MG tablet Take 1 tablet (10 mg total) by mouth 2 (two) times daily before a meal. (Patient taking differently: Take 10 mg by mouth daily before breakfast.) 180 tablet 3   glucose blood test strip 1 each by Other route as needed for other. accu chek aviva plusUse as instructed     ketoconazole (NIZORAL) 2 % cream Apply 1 Application topically 2 (two) times daily as  needed for irritation. 60 g 5   Lactulose 20 GM/30ML SOLN Take 30 mLs (20 g total) by mouth daily as needed (constipation.). 450 mL 0   lidocaine-prilocaine (EMLA) cream Apply 1 Application topically as needed. 30 g 5   LORazepam (ATIVAN) 0.5 MG tablet TAKE 1 TABLET (0.5 MG TOTAL) BY MOUTH EVERY 8 (EIGHT) HOURS AS NEEDED FOR ANXIETY (NAUSEA VOMITING). 60 tablet 0   metFORMIN (GLUCOPHAGE) 500 MG tablet TAKE TWO TABLETS BY MOUTH EVERY MORNING WITH BREAKFAST 180 tablet 1   metoprolol tartrate (LOPRESSOR) 50 MG tablet Take 1 tablet (50 mg total) by mouth 2 (two) times daily. 180 tablet  3   MUCINEX D MAX STRENGTH [phone] MG TB12 Take 1,200 mg by mouth 2 (two) times daily as needed (for congestion or to loosen mucous in the chest).     ondansetron (ZOFRAN) 4 MG tablet Take by mouth.     oxyCODONE (OXY IR/ROXICODONE) 5 MG immediate release tablet Take 1 tablet (5 mg total) by mouth every 4 (four) hours as needed for severe pain. 60 tablet 0   potassium chloride (KLOR-CON) 10 MEQ tablet Take 1 tablet (10 mEq total) by mouth daily. 90 tablet 3   pravastatin (PRAVACHOL) 20 MG tablet Take 1 tablet (20 mg total) by mouth daily. 90 tablet 3   prochlorperazine (COMPAZINE) 10 MG tablet Take 10 mg by mouth every 6 (six) hours as needed.     silver sulfADIAZINE (SILVADENE) 1 % cream Apply 1 Application topically daily. 50 g 0   SLOW-MAG 71.5-119 MG TBEC SR tablet TAKE 1 TABLET (64 MG TOTAL) BY MOUTH DAILY 60 tablet 1   tadalafil (CIALIS) 20 MG tablet Take 0.5-1 tablets (10-20 mg total) by mouth every other day as needed for erectile dysfunction. 20 tablet 11   traZODone (DESYREL) 50 MG tablet TAKE 1 TABLET BY MOUTH AT BEDTIME AS NEEDED FOR SLEEP. 30 tablet 1   triamcinolone cream (KENALOG) 0.1 % Apply 1 Application topically 3 (three) times daily. 80 g 5   warfarin (COUMADIN) 5 MG tablet Take 1 tablet (5 mg total) by mouth daily. 30 tablet 5   torsemide (DEMADEX) 20 MG tablet Take 1 tablet (20 mg total) by mouth 2  (two) times daily as needed. (Patient not taking: Reported on 07/08/2023) 180 tablet 3   No current facility-administered medications for this visit.   Facility-Administered Medications Ordered in Other Visits  Medication Dose Route Frequency Provider Last Rate Last Admin   dexamethasone (DECADRON) 10 mg in sodium chloride 0.9 % 50 mL IVPB  10 mg Intravenous Once [name], MD 204 mL/hr at 07/24/23 0956 10 mg at 07/24/23 0956   fluorouracil (ADRUCIL) 5,750 mg in sodium chloride 0.9 % 135 mL chemo infusion  2,400 mg/m2 (Order-Specific) Intravenous 1 day or 1 dose [name], MD       leucovorin 956 mg in dextrose 5 % 250 mL infusion  400 mg/m2 (Order-Specific) Intravenous Once [name], MD       oxaliplatin (ELOXATIN) 150 mg in dextrose 5 % 500 mL chemo infusion  65 mg/m2 (Order-Specific) Intravenous Once [name], MD       palonosetron (ALOXI) injection 0.25 mg  0.25 mg Intravenous Once [name], MD       sodium chloride flush (NS) 0.9 % injection 10 mL  10 mL Intravenous PRN [name], MD   10 mL at 07/18/23 0951     PHYSICAL EXAMINATION: ECOG PERFORMANCE STATUS: 1 - Symptomatic but completely ambulatory Vitals:   07/24/23 0850 07/24/23 0852  BP: (!) 110/45 (!) 105/56  Pulse: 61   Resp: 18   Temp: 98 F (36.7 C)   SpO2:  100%   Filed Weights   07/24/23 0850  Weight: 243 lb 14.4 oz (110.6 kg)    Physical Exam Constitutional:      General: He is not in acute distress. HENT:     Head: Normocephalic and atraumatic.  Eyes:     General: No scleral icterus. Cardiovascular:     Rate and Rhythm: Normal rate.  Pulmonary:     Effort: Pulmonary effort is normal. No respiratory distress.  Breath sounds: No wheezing.  Abdominal:     General: Bowel sounds are normal. There is no distension.  Musculoskeletal:        General: Normal range of motion.     Cervical back: Normal range of motion and neck supple.     Right lower leg: Edema present.     Left lower leg: Edema present.  Skin:     General: Skin is warm and dry.     Findings: No erythema.  Neurological:     Mental Status: He is alert and oriented to person, place, and time. Mental status is at baseline.  Psychiatric:        Mood and Affect: Mood normal.     LABORATORY DATA:  I have reviewed the data as listed    Latest Ref Rng & Units 07/24/2023    8:11 AM 07/18/2023    9:45 AM 07/08/2023    9:32 AM  CBC  WBC 4.0 - 10.5 K/uL 3.8  4.0  2.5   Hemoglobin 13.0 - 17.0 g/dL 16.1  09.6  04.5   Hematocrit 39.0 - 52.0 % 31.9  33.8  34.8   Platelets 150 - 400 K/uL 112  125  100       Latest Ref Rng & Units 07/18/2023    9:45 AM 07/08/2023    9:32 AM 06/24/2023    9:12 AM  CMP  Glucose 70 - 99 mg/dL 409  94  88   BUN 8 - 23 mg/dL 7  10  9    Creatinine 0.61 - 1.24 mg/dL 8.11  9.14  7.82   Sodium 135 - 145 mmol/L 134  133  132   Potassium 3.5 - 5.1 mmol/L 3.9  3.2  3.9   Chloride 98 - 111 mmol/L 100  103  103   CO2 22 - 32 mmol/L 25  24  25    Calcium 8.9 - 10.3 mg/dL 8.9  8.4  8.6   Total Protein 6.5 - 8.1 g/dL 5.7  6.0  5.9   Total Bilirubin 0.3 - 1.2 mg/dL 1.1  0.9  0.8   Alkaline Phos 38 - 126 U/L 117  115  111   AST 15 - 41 U/L 33  39  35   ALT 0 - 44 U/L 18  24  20      RADIOGRAPHIC STUDIES: I have personally reviewed the radiological images as listed and agreed with the findings in the report. MR Lumbar Spine W Wo Contrast  Result Date: 07/18/2023 CLINICAL DATA:  spine lesions, lower extremity weakness. History of gallbladder cancer with back pain and bilateral leg weakness. EXAM: MRI THORACIC AND LUMBAR SPINE WITHOUT AND WITH CONTRAST TECHNIQUE: Multiplanar and multiecho pulse sequences of the thoracic and lumbar spine were obtained without and with intravenous contrast. CONTRAST:  10mL GADAVIST GADOBUTROL 1 MMOL/ML IV SOLN COMPARISON:  CT chest/abdomen/pelvis 07/16/2023. FINDINGS: MRI THORACIC SPINE FINDINGS Alignment:  Normal. Vertebrae: Multiple enhancing lesions in the thoracic spine, including the right  aspect of the T4 and T9 vertebral bodies, as well as the posterior elements of T12 and spinous process of T4. No epidural or paraspinal extension of tumor. Cord: Normal spinal cord signal and volume. No abnormal enhancement. Paraspinal and other soft tissues: No acute findings. See recent body CT report for description of findings in the chest. Disc levels: No significant disc herniation or spinal canal stenosis. Right-greater-than-left facet arthropathy at T11-12 results in moderate right neural foraminal narrowing. MRI LUMBAR SPINE FINDINGS Segmentation:  Standard.  Alignment:  Normal. Vertebrae: Congenitally short pedicles throughout the lumbar spine. Heterogeneously enhancing lesion along the right aspect of the inferior endplate of L4, possibly an acute Schmorl's node versus site of metastasis. Enhancing lesion in the spinous process of L5. Enhancing lesion in the left posterior elements of T12 with mild extension into the left dorsal epidural space (axial image 2 series 4 and 7) and mild paraspinal extension. Enhancing lesions in the right sacral ala at the S1 level and right iliac crest. Conus medullaris: Extends to the L1-2 level and appears normal. No abnormal enhancement of the cauda equina nerve roots. Paraspinal and other soft tissues: No acute findings. See recent body CT report for description of intra-abdominal and pelvic findings. Disc levels: Disc bulge and facet arthropathy results in severe spinal canal stenosis at L4-5. Left eccentric disc bulge compresses the traversing left S1 nerve root in the subarticular zone at L5-S1. IMPRESSION: 1. Multiple enhancing lesions in the thoracic and lumbar spine, right sacral ala, and right iliac crest, consistent with metastatic disease. 2. Enhancing lesion in the left posterior elements of T12 with mild extension into the left dorsal epidural space and mild paraspinal extension. No significant spinal canal stenosis. 3. Severe spinal canal stenosis at L4-5. 4.  Left eccentric disc bulge compresses the traversing left S1 nerve root in the subarticular zone at L5-S1. Electronically Signed   By: Orvan Falconer M.D.   On: 07/18/2023 15:11   MR Thoracic Spine W Wo Contrast  Result Date: 07/18/2023 CLINICAL DATA:  spine lesions, lower extremity weakness. History of gallbladder cancer with back pain and bilateral leg weakness. EXAM: MRI THORACIC AND LUMBAR SPINE WITHOUT AND WITH CONTRAST TECHNIQUE: Multiplanar and multiecho pulse sequences of the thoracic and lumbar spine were obtained without and with intravenous contrast. CONTRAST:  10mL GADAVIST GADOBUTROL 1 MMOL/ML IV SOLN COMPARISON:  CT chest/abdomen/pelvis 07/16/2023. FINDINGS: MRI THORACIC SPINE FINDINGS Alignment:  Normal. Vertebrae: Multiple enhancing lesions in the thoracic spine, including the right aspect of the T4 and T9 vertebral bodies, as well as the posterior elements of T12 and spinous process of T4. No epidural or paraspinal extension of tumor. Cord: Normal spinal cord signal and volume. No abnormal enhancement. Paraspinal and other soft tissues: No acute findings. See recent body CT report for description of findings in the chest. Disc levels: No significant disc herniation or spinal canal stenosis. Right-greater-than-left facet arthropathy at T11-12 results in moderate right neural foraminal narrowing. MRI LUMBAR SPINE FINDINGS Segmentation:  Standard. Alignment:  Normal. Vertebrae: Congenitally short pedicles throughout the lumbar spine. Heterogeneously enhancing lesion along the right aspect of the inferior endplate of L4, possibly an acute Schmorl's node versus site of metastasis. Enhancing lesion in the spinous process of L5. Enhancing lesion in the left posterior elements of T12 with mild extension into the left dorsal epidural space (axial image 2 series 4 and 7) and mild paraspinal extension. Enhancing lesions in the right sacral ala at the S1 level and right iliac crest. Conus medullaris:  Extends to the L1-2 level and appears normal. No abnormal enhancement of the cauda equina nerve roots. Paraspinal and other soft tissues: No acute findings. See recent body CT report for description of intra-abdominal and pelvic findings. Disc levels: Disc bulge and facet arthropathy results in severe spinal canal stenosis at L4-5. Left eccentric disc bulge compresses the traversing left S1 nerve root in the subarticular zone at L5-S1. IMPRESSION: 1. Multiple enhancing lesions in the thoracic and lumbar spine, right sacral ala, and right iliac  crest, consistent with metastatic disease. 2. Enhancing lesion in the left posterior elements of T12 with mild extension into the left dorsal epidural space and mild paraspinal extension. No significant spinal canal stenosis. 3. Severe spinal canal stenosis at L4-5. 4. Left eccentric disc bulge compresses the traversing left S1 nerve root in the subarticular zone at L5-S1. Electronically Signed   By: Orvan Falconer M.D.   On: 07/18/2023 15:11   CT CHEST ABDOMEN PELVIS W CONTRAST  Result Date: 07/17/2023 CLINICAL DATA:  Cholangiocarcinoma restaging. Prior chemotherapy and radiation therapy. * Tracking Code: BO * EXAM: CT CHEST, ABDOMEN, AND PELVIS WITH CONTRAST TECHNIQUE: Multidetector CT imaging of the chest, abdomen and pelvis was performed following the standard protocol during bolus administration of intravenous contrast. RADIATION DOSE REDUCTION: This exam was performed according to the departmental dose-optimization program which includes automated exposure control, adjustment of the mA and/or kV according to patient size and/or use of iterative reconstruction technique. CONTRAST:  ISOVUE-300 IOPAMIDOL (ISOVUE-300) INJECTION 61% COMPARISON:  04/25/2023 FINDINGS: CT CHEST FINDINGS Cardiovascular: Right Port-A-Cath tip: Lower SVC. Atheromatous vascular calcification of the thoracic aorta and coronary arteries. Borderline cardiomegaly. Mediastinum/Nodes: Stable  left thyroid nodule 3.5 cm. In the setting of significant comorbidities or limited life expectancy, no follow-up recommended (ref: J Am Coll Radiol. 2015 Feb;12(2): 143-50).Right paratracheal node 0.8 cm in short axis, formerly 1.1 cm, image 18 series 3. Another right paratracheal lymph node measures 0.8 cm on image 23 series 3, formerly 1.0 cm. Additional upper normal sized subcarinal infrahilar lymph nodes. No current overt pathologic adenopathy in the chest. Lungs/Pleura: Mild biapical pleuroparenchymal scarring. Mild scarring in the posterior basal segments of both lower lobes. Stable lingular scarring. The patchy opacities in the lingula and left lower lobe shown on the prior exam have resolved. Musculoskeletal: Enlarging lytic lesion of the T4 vertebral body eccentric to the right, 3.0 cm in long axis on image 107 series 8, formerly 1.9 cm in long axis. Enlarging lytic lesion in the T9 vertebral body. Essentially stable sclerotic lesion in the sternal body. New lytic lesion, cannot exclude pathologic fracture, posterolaterally in the ninth rib, image 130 series 5. Healing fracture of the right eighth rib laterally, nonspecific for underlying pathologic fracture, this could simply be a benign subacute rib fracture. Enlarging mixed lytic and sclerotic lesion of the left scapula, image 19 series 5. CT ABDOMEN PELVIS FINDINGS Hepatobiliary: Dominant right hepatic lobe mass measures 7.4 by 5.8 cm on image 65 series 3, formerly 7.1 by 6.8 cm, roughly stable. Other stable tiny hypodense lesions in the right hepatic lobe and in segment 2 of the left hepatic lobe, nonspecific. Prominent caudate lobe, suspicion for early cirrhosis. Gallbladder unremarkable. No biliary dilatation. Patent portal vein. Pancreas: Unremarkable Spleen: Spleen measures 13.1 by 8.0 by 12.8 cm (volume = 700 cm^3), compatible with splenomegaly. No focal splenic lesion identified. Adrenals/Urinary Tract: Stable benign renal cystic lesions  including the lesion with calcified septations in the right upper lobe compatible with benign Bosniak category 2 cyst based on previous and current imaging workup assessments. No further imaging workup of these lesions is indicated. Adrenal glands normal. Left distal ureter an urinary bladder partially obscured by streak artifact from the patient's left hip implant. No hydronephrosis or hydroureter. Stomach/Bowel: Focal high density in the small bowel at 1.2 cm on image 116 series 3, probably a high density pill fragment, less likely to be an intra lith. Otherwise unremarkable. Vascular/Lymphatic: Atherosclerosis is present, including aortoiliac atherosclerotic disease. Right gastric lymph nodes observed,  including a 2.7 by 1.3 cm node on image 66 of series 3, previously 3.7 by 1.7 cm by my measurements, accordingly moderately reduced in size from previous. Aortocaval node 1.1 cm in short axis on image 81 series 3, formerly same. Other scattered smaller retroperitoneal and mesenteric lymph nodes are present. Reproductive: Mild prostatomegaly. Prostate gland partially obscured by streak artifact from the patient's left hip implant. Other: Mild ascites and scattered mesenteric edema. Confluent left upper quadrant omental edema without overt omental nodularity. Musculoskeletal: Enlarging lytic metastatic lesions including the left T12 pedicle, the right L4 inferior endplate, the L5 spinous process, the right hip quadrilateral plate, the bilateral ischium and the left posterior acetabulum. Mild increase in size of the sclerotic lesion in the right iliac bone on image 105 series 3. Cannot exclude epidural extension of tumor adjacent to the T12 posterior element involvement. IMPRESSION: 1. Mixed response to therapy. The dominant right hepatic lobe mass is roughly stable in size. The right gastric lymph node has moderately reduced in size. The right paratracheal lymph nodes have decreased in size. However, there are  multiple enlarging lytic lesions in the axial and appendicular skeleton, including the T4 vertebral body, the T9 vertebral body, the left scapula, the right hip quadrilateral plate, the bilateral ischium, and the left posterior acetabulum. Cannot exclude epidural extension of tumor adjacent to the T12 posterior element involvement. 2. Healing fracture of the right eighth rib laterally, nonspecific for underlying pathologic fracture, this could simply be a benign subacute rib fracture. 3. Mild ascites and scattered mesenteric edema. Confluent left upper quadrant omental edema without overt omental nodularity. 4. Splenomegaly. 5. Mild prostatomegaly. 6. Aortic atherosclerosis. 7. The patchy opacities in the lingula and left lower lobe shown on the prior exam have resolved. Aortic Atherosclerosis (ICD10-I70.0). Electronically Signed   By: Gaylyn Rong M.D.   On: 07/17/2023 11:31   DG Chest 2 View  Result Date: 07/06/2023 CLINICAL DATA:  cough, green sputum EXAM: CHEST - 2 VIEW COMPARISON:  February 11, 2023, April 25, 2023 FINDINGS: The cardiomediastinal silhouette is unchanged in contour.RIGHT chest port with tip terminating over the superior cavoatrial junction. No pleural effusion. No pneumothorax. Similar LEFT basilar coarse reticular prominence, better evaluated on prior CT. No acute pleuroparenchymal abnormality. Visualized abdomen is unremarkable. Multilevel degenerative changes of the thoracic spine. IMPRESSION: Similar LEFT basilar coarse reticular prominence, better evaluated on prior CT. No new focal airspace opacity. Electronically Signed   By: Meda Klinefelter M.D.   On: 07/06/2023 16:49

## 2023-07-24 NOTE — Assessment & Plan Note (Signed)
Continue calcium supplementation, currently on 1000 mg twice daily.   Recommend Vitamin D 1000 units daily.

## 2023-07-25 ENCOUNTER — Ambulatory Visit (INDEPENDENT_AMBULATORY_CARE_PROVIDER_SITE_OTHER): Payer: BC Managed Care – PPO

## 2023-07-25 DIAGNOSIS — Z7901 Long term (current) use of anticoagulants: Secondary | ICD-10-CM | POA: Diagnosis not present

## 2023-07-25 LAB — CANCER ANTIGEN 19-9: CA 19-9: 114 U/mL — ABNORMAL HIGH (ref 0–35)

## 2023-07-25 LAB — CEA: CEA: 10.3 ng/mL — ABNORMAL HIGH (ref 0.0–4.7)

## 2023-07-25 NOTE — Progress Notes (Signed)
Pt had INR lab draw today at oncology infusion center.  Continue 1 tablet daily except take 1/2 tablet on Monday, Wednesday and Friday. Recheck on 10/9 at oncology infusion center.   LVM and advised of dosing Sent msg to oncology requesting lab INR on 10/9.

## 2023-07-25 NOTE — Patient Instructions (Addendum)
Pre visit review using our clinic review tool, if applicable. No additional management support is needed unless otherwise documented below in the visit note.  Continue 1 tablet daily except take 1/2 tablet on Monday, Wednesday and Friday. Recheck on 10/9 at oncology infusion center.

## 2023-07-26 ENCOUNTER — Inpatient Hospital Stay: Payer: BC Managed Care – PPO

## 2023-07-26 VITALS — BP 113/47 | HR 66 | Temp 99.3°F | Resp 18

## 2023-07-26 DIAGNOSIS — R634 Abnormal weight loss: Secondary | ICD-10-CM | POA: Diagnosis not present

## 2023-07-26 DIAGNOSIS — D6861 Antiphospholipid syndrome: Secondary | ICD-10-CM | POA: Diagnosis not present

## 2023-07-26 DIAGNOSIS — Z5111 Encounter for antineoplastic chemotherapy: Secondary | ICD-10-CM | POA: Diagnosis not present

## 2023-07-26 DIAGNOSIS — R630 Anorexia: Secondary | ICD-10-CM | POA: Diagnosis not present

## 2023-07-26 DIAGNOSIS — C221 Intrahepatic bile duct carcinoma: Secondary | ICD-10-CM

## 2023-07-26 DIAGNOSIS — E876 Hypokalemia: Secondary | ICD-10-CM | POA: Diagnosis not present

## 2023-07-26 DIAGNOSIS — C7951 Secondary malignant neoplasm of bone: Secondary | ICD-10-CM | POA: Diagnosis not present

## 2023-07-26 DIAGNOSIS — I429 Cardiomyopathy, unspecified: Secondary | ICD-10-CM | POA: Diagnosis not present

## 2023-07-26 DIAGNOSIS — I4891 Unspecified atrial fibrillation: Secondary | ICD-10-CM | POA: Diagnosis not present

## 2023-07-26 DIAGNOSIS — Z7901 Long term (current) use of anticoagulants: Secondary | ICD-10-CM | POA: Diagnosis not present

## 2023-07-26 DIAGNOSIS — Z23 Encounter for immunization: Secondary | ICD-10-CM | POA: Diagnosis not present

## 2023-07-26 DIAGNOSIS — Z79899 Other long term (current) drug therapy: Secondary | ICD-10-CM | POA: Diagnosis not present

## 2023-07-26 MED ORDER — SODIUM CHLORIDE 0.9% FLUSH
10.0000 mL | INTRAVENOUS | Status: DC | PRN
  Administered 2023-07-26: 10 mL
  Filled 2023-07-26: qty 10

## 2023-07-26 MED ORDER — HEPARIN SOD (PORK) LOCK FLUSH 100 UNIT/ML IV SOLN
500.0000 [IU] | Freq: Once | INTRAVENOUS | Status: AC | PRN
  Administered 2023-07-26: 500 [IU]
  Filled 2023-07-26: qty 5

## 2023-07-30 ENCOUNTER — Other Ambulatory Visit: Payer: Self-pay | Admitting: Radiation Oncology

## 2023-07-30 DIAGNOSIS — C221 Intrahepatic bile duct carcinoma: Secondary | ICD-10-CM

## 2023-08-01 ENCOUNTER — Ambulatory Visit: Admission: RE | Admit: 2023-08-01 | Payer: BC Managed Care – PPO | Source: Ambulatory Visit

## 2023-08-01 ENCOUNTER — Ambulatory Visit: Payer: BC Managed Care – PPO

## 2023-08-02 ENCOUNTER — Ambulatory Visit
Admission: RE | Admit: 2023-08-02 | Discharge: 2023-08-02 | Disposition: A | Discharge status: Home / Self Care | Payer: BC Managed Care – PPO | Source: Ambulatory Visit | Attending: Radiation Oncology | Admitting: Radiation Oncology

## 2023-08-02 DIAGNOSIS — C7951 Secondary malignant neoplasm of bone: Secondary | ICD-10-CM | POA: Insufficient documentation

## 2023-08-02 DIAGNOSIS — C221 Intrahepatic bile duct carcinoma: Secondary | ICD-10-CM | POA: Insufficient documentation

## 2023-08-02 DIAGNOSIS — Z452 Encounter for adjustment and management of vascular access device: Secondary | ICD-10-CM | POA: Diagnosis not present

## 2023-08-02 DIAGNOSIS — Z5111 Encounter for antineoplastic chemotherapy: Secondary | ICD-10-CM | POA: Insufficient documentation

## 2023-08-02 DIAGNOSIS — Z510 Encounter for antineoplastic radiation therapy: Secondary | ICD-10-CM | POA: Diagnosis not present

## 2023-08-02 DIAGNOSIS — D6861 Antiphospholipid syndrome: Secondary | ICD-10-CM | POA: Insufficient documentation

## 2023-08-02 DIAGNOSIS — Z7901 Long term (current) use of anticoagulants: Secondary | ICD-10-CM | POA: Insufficient documentation

## 2023-08-02 DIAGNOSIS — R630 Anorexia: Secondary | ICD-10-CM | POA: Diagnosis not present

## 2023-08-02 DIAGNOSIS — E876 Hypokalemia: Secondary | ICD-10-CM | POA: Insufficient documentation

## 2023-08-04 ENCOUNTER — Other Ambulatory Visit: Payer: Self-pay | Admitting: Oncology

## 2023-08-05 ENCOUNTER — Encounter: Payer: Self-pay | Admitting: Oncology

## 2023-08-05 ENCOUNTER — Ambulatory Visit: Payer: BC Managed Care – PPO | Admitting: Oncology

## 2023-08-05 ENCOUNTER — Ambulatory Visit: Payer: BC Managed Care – PPO | Attending: Oncology | Admitting: Physical Therapy

## 2023-08-05 ENCOUNTER — Encounter: Payer: Self-pay | Admitting: Physical Therapy

## 2023-08-05 ENCOUNTER — Ambulatory Visit: Payer: BC Managed Care – PPO

## 2023-08-05 ENCOUNTER — Other Ambulatory Visit: Payer: BC Managed Care – PPO

## 2023-08-05 DIAGNOSIS — M6281 Muscle weakness (generalized): Secondary | ICD-10-CM | POA: Insufficient documentation

## 2023-08-05 DIAGNOSIS — C221 Intrahepatic bile duct carcinoma: Secondary | ICD-10-CM | POA: Insufficient documentation

## 2023-08-05 DIAGNOSIS — R29898 Other symptoms and signs involving the musculoskeletal system: Secondary | ICD-10-CM | POA: Diagnosis not present

## 2023-08-05 DIAGNOSIS — R296 Repeated falls: Secondary | ICD-10-CM | POA: Insufficient documentation

## 2023-08-05 DIAGNOSIS — R6889 Other general symptoms and signs: Secondary | ICD-10-CM | POA: Insufficient documentation

## 2023-08-05 DIAGNOSIS — R2689 Other abnormalities of gait and mobility: Secondary | ICD-10-CM | POA: Diagnosis not present
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
## 2023-08-06 MED FILL — Dexamethasone Sodium Phosphate Inj 100 MG/10ML: INTRAMUSCULAR | Qty: 1 | Status: AC

## 2023-08-07 ENCOUNTER — Telehealth: Payer: Self-pay

## 2023-08-07 ENCOUNTER — Ambulatory Visit (INDEPENDENT_AMBULATORY_CARE_PROVIDER_SITE_OTHER): Payer: Self-pay

## 2023-08-07 ENCOUNTER — Other Ambulatory Visit: Payer: Self-pay

## 2023-08-07 ENCOUNTER — Inpatient Hospital Stay: Payer: BC Managed Care – PPO

## 2023-08-07 ENCOUNTER — Inpatient Hospital Stay (HOSPITAL_BASED_OUTPATIENT_CLINIC_OR_DEPARTMENT_OTHER): Payer: BC Managed Care – PPO | Admitting: Oncology

## 2023-08-07 ENCOUNTER — Encounter: Payer: Self-pay | Admitting: Oncology

## 2023-08-07 VITALS — BP 135/72 | HR 64 | Temp 98.0°F | Resp 18 | Wt 239.7 lb

## 2023-08-07 DIAGNOSIS — R630 Anorexia: Secondary | ICD-10-CM | POA: Insufficient documentation

## 2023-08-07 DIAGNOSIS — C7951 Secondary malignant neoplasm of bone: Secondary | ICD-10-CM | POA: Diagnosis not present

## 2023-08-07 DIAGNOSIS — Z5111 Encounter for antineoplastic chemotherapy: Secondary | ICD-10-CM | POA: Insufficient documentation

## 2023-08-07 DIAGNOSIS — I480 Paroxysmal atrial fibrillation: Secondary | ICD-10-CM

## 2023-08-07 DIAGNOSIS — C221 Intrahepatic bile duct carcinoma: Secondary | ICD-10-CM | POA: Diagnosis not present

## 2023-08-07 DIAGNOSIS — Z452 Encounter for adjustment and management of vascular access device: Secondary | ICD-10-CM | POA: Insufficient documentation

## 2023-08-07 DIAGNOSIS — E876 Hypokalemia: Secondary | ICD-10-CM | POA: Insufficient documentation

## 2023-08-07 DIAGNOSIS — D6861 Antiphospholipid syndrome: Secondary | ICD-10-CM | POA: Insufficient documentation

## 2023-08-07 DIAGNOSIS — Z7901 Long term (current) use of anticoagulants: Secondary | ICD-10-CM | POA: Insufficient documentation

## 2023-08-07 DIAGNOSIS — Z510 Encounter for antineoplastic radiation therapy: Secondary | ICD-10-CM | POA: Diagnosis not present

## 2023-08-07 LAB — CBC WITH DIFFERENTIAL (CANCER CENTER ONLY)
Abs Immature Granulocytes: 0.01 10*3/uL (ref 0.00–0.07)
Basophils Absolute: 0 10*3/uL (ref 0.0–0.1)
Basophils Relative: 1 %
Eosinophils Absolute: 0 10*3/uL (ref 0.0–0.5)
Eosinophils Relative: 0 %
HCT: 31.9 % — ABNORMAL LOW (ref 39.0–52.0)
Hemoglobin: 10.2 g/dL — ABNORMAL LOW (ref 13.0–17.0)
Immature Granulocytes: 0 %
Lymphocytes Relative: 6 %
Lymphs Abs: 0.3 10*3/uL — ABNORMAL LOW (ref 0.7–4.0)
MCH: 30.5 pg (ref 26.0–34.0)
MCHC: 32 g/dL (ref 30.0–36.0)
MCV: 95.5 fL (ref 80.0–100.0)
Monocytes Absolute: 0.6 10*3/uL (ref 0.1–1.0)
Monocytes Relative: 15 %
Neutro Abs: 3.3 10*3/uL (ref 1.7–7.7)
Neutrophils Relative %: 78 %
Platelet Count: 130 10*3/uL — ABNORMAL LOW (ref 150–400)
RBC: 3.34 MIL/uL — ABNORMAL LOW (ref 4.22–5.81)
RDW: 15.4 % (ref 11.5–15.5)
WBC Count: 4.2 10*3/uL (ref 4.0–10.5)
nRBC: 0 % (ref 0.0–0.2)

## 2023-08-07 LAB — CMP (CANCER CENTER ONLY)
ALT: 16 U/L (ref 0–44)
AST: 31 U/L (ref 15–41)
Albumin: 2.9 g/dL — ABNORMAL LOW (ref 3.5–5.0)
Alkaline Phosphatase: 100 U/L (ref 38–126)
Anion gap: 5 (ref 5–15)
BUN: 8 mg/dL (ref 8–23)
CO2: 25 mmol/L (ref 22–32)
Calcium: 8.7 mg/dL — ABNORMAL LOW (ref 8.9–10.3)
Chloride: 105 mmol/L (ref 98–111)
Creatinine: 0.64 mg/dL (ref 0.61–1.24)
GFR, Estimated: >60 mL/min (ref >60)
Glucose, Bld: 91 mg/dL (ref 70–99)
Potassium: 3.7 mmol/L (ref 3.5–5.1)
Sodium: 135 mmol/L (ref 135–145)
Total Bilirubin: 1.1 mg/dL (ref 0.3–1.2)
Total Protein: 5.8 g/dL — ABNORMAL LOW (ref 6.5–8.1)

## 2023-08-07 LAB — PROTIME-INR
INR: 2 — ABNORMAL HIGH (ref 0.8–1.2)
Prothrombin Time: 23.1 s — ABNORMAL HIGH (ref 11.4–15.2)

## 2023-08-07 MED ORDER — SODIUM CHLORIDE 0.9 % IV SOLN
2400.0000 mg/m2 | INTRAVENOUS | Status: DC
  Administered 2023-08-07: 5750 mg via INTRAVENOUS
  Filled 2023-08-07: qty 115

## 2023-08-07 MED ORDER — DEXTROSE 5 % IV SOLN
Freq: Once | INTRAVENOUS | Status: AC
  Filled 2023-08-07: qty 250

## 2023-08-07 MED ORDER — OXALIPLATIN CHEMO INJECTION 100 MG/20ML
65.0000 mg/m2 | Freq: Once | INTRAVENOUS | Status: AC
  Administered 2023-08-07: 150 mg via INTRAVENOUS
  Filled 2023-08-07: qty 10

## 2023-08-07 MED ORDER — PALONOSETRON HCL INJECTION 0.25 MG/5ML
0.2500 mg | Freq: Once | INTRAVENOUS | Status: AC
  Administered 2023-08-07: 0.25 mg via INTRAVENOUS
  Filled 2023-08-07: qty 5

## 2023-08-07 MED ORDER — SODIUM CHLORIDE 0.9 % IV SOLN
10.0000 mg | Freq: Once | INTRAVENOUS | Status: AC
  Administered 2023-08-07: 10 mg via INTRAVENOUS
  Filled 2023-08-07: qty 10

## 2023-08-07 MED ORDER — OXYCODONE HCL 5 MG PO TABS
5.0000 mg | ORAL_TABLET | ORAL | 0 refills | Status: DC | PRN
  Filled 2023-08-07: qty 60, 10d supply, fill #0

## 2023-08-07 MED ORDER — LEUCOVORIN CALCIUM INJECTION 350 MG
400.0000 mg/m2 | Freq: Once | INTRAVENOUS | Status: AC
  Administered 2023-08-07: 956 mg via INTRAVENOUS
  Filled 2023-08-07: qty 47.8

## 2023-08-07 NOTE — Assessment & Plan Note (Signed)
S/p palliative  radiation  Consider bisphosphonate after dental clearance is obtained. S/p left ischium.  radiation due to concern of pathological fracture.  Right thigh pain, no RT per Radonc. Felt was due to arthritis.  CT showed worse of bone metastatic disease, T12 epidural tumor extension- MRI thoracic and lumbar spine showed T12 epidural paraspinal tumor extension.  Follow up with Radonc for radiation of  T12

## 2023-08-07 NOTE — Assessment & Plan Note (Signed)
Continue calcium supplementation, currently on 1000 mg twice daily.   Recommend Vitamin D 1000 units daily.

## 2023-08-07 NOTE — Patient Instructions (Signed)
[facility] [place] [facility]  Discharge Instructions: Thank you for choosing [facility] to provide your oncology and hematology care.  If you have a lab appointment with the Cancer Center, please go directly to the Cancer Center and check in at the registration area.  Wear comfortable clothing and clothing appropriate for easy access to any Portacath or PICC line.   We strive to give you quality time with your provider. You may need to reschedule your appointment if you arrive late (15 or more minutes).  Arriving late affects you and other patients whose appointments are after yours.  Also, if you miss three or more appointments without notifying the office, you may be dismissed from the clinic at the provider's discretion.      For prescription refill requests, have your pharmacy contact our office and allow 72 hours for refills to be completed.    Today you received the following chemotherapy and/or immunotherapy agents- oxaliplatin, leucovorin, 5FU      To help prevent nausea and vomiting after your treatment, we encourage you to take your nausea medication as directed.  BELOW ARE SYMPTOMS THAT SHOULD BE REPORTED IMMEDIATELY: *FEVER GREATER THAN 100.4 F (38 C) OR HIGHER *CHILLS OR SWEATING *NAUSEA AND VOMITING THAT IS NOT CONTROLLED WITH YOUR NAUSEA MEDICATION *UNUSUAL SHORTNESS OF BREATH *UNUSUAL BRUISING OR BLEEDING *URINARY PROBLEMS (pain or burning when urinating, or frequent urination) *BOWEL PROBLEMS (unusual diarrhea, constipation, pain near the anus) TENDERNESS IN MOUTH AND THROAT WITH OR WITHOUT PRESENCE OF ULCERS (sore throat, sores in mouth, or a toothache) UNUSUAL RASH, SWELLING OR PAIN  UNUSUAL VAGINAL DISCHARGE OR ITCHING   Items with * indicate a potential emergency and should be followed up as soon as possible or go to the Emergency Department if any problems should occur.  Please show the CHEMOTHERAPY ALERT CARD or IMMUNOTHERAPY ALERT  CARD at check-in to the Emergency Department and triage nurse.  Should you have questions after your visit or need to cancel or reschedule your appointment, please contact [facility] [place] [place] [facility]  [phone] and follow the prompts.  Office hours are 8:00 a.m. to 4:30 p.m. Monday - Friday. Please note that voicemails left after 4:00 p.m. may not be returned until the following business day.  We are closed weekends and major holidays. You have access to a nurse at all times for urgent questions. Please call the main number to the clinic [phone] and follow the prompts.  For any non-urgent questions, you may also contact your provider using MyChart. We now offer e-Visits for anyone 18 and older to request care online for non-urgent symptoms. For details visit [email or web address].   Also download the MyChart app! Go to the app store, search "MyChart", open the app, select , and log in with your MyChart username and password.    

## 2023-08-07 NOTE — Progress Notes (Signed)
I have reviewed the patient's encounter and agree with the documentation.  Katina Degree. Jimmey Ralph, MD 08/07/2023 11:04 AM

## 2023-08-07 NOTE — Progress Notes (Signed)
Nutrition Follow-up:  Patient with cholangiocarcinoma.  Patient receiving 3rd line folfox.  Planning to start palliative radiation to T11-L1  Met with patient during infusion.  Reports that appetite is there but having taste alterations.  Some foods are tasting really sweet.    Medications: reviewed  Labs: reviewed  Anthropometrics:   Weight 239 lb 11.2 oz on 10/9 243 lb on 9/25 238 lb on 9/19 258 lb on 7/29 275 lb on 6/13 287 lb on 5/29   NUTRITION DIAGNOSIS: Unintentional weight loss continues   INTERVENTION:  Continue high calorie, high protein foods Continue to experiment with different foods to manage taste alterations.     MONITORING, EVALUATION, GOAL: weight trends, intake   NEXT VISIT: Wednesday, Oct 23 during infusion  Devion Chriscoe B. Freida Busman, RD, LDN Registered Dietitian [phone]

## 2023-08-07 NOTE — Progress Notes (Signed)
Pt had INR lab draw today at oncology infusion center.  Continue 1 tablet daily except take 1/2 tablet on Monday, Wednesday and Friday. Recheck on 10/15 at oncology infusion center.   LVM and advised of dosing Sent msg to oncology requesting lab INR on 10/15.   Sent a msg to oncology requesting another INR on 10/15.

## 2023-08-07 NOTE — Progress Notes (Signed)
Pt here for follow up. Pt requesting Oxycodone refill. He also wanting a prescription for Walker with wheels.

## 2023-08-07 NOTE — Assessment & Plan Note (Addendum)
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 [facility] 2nd opinion was reviewed.  Recommend to proceed with 3rd line treatment with FOLFOX, CT showed mixed response after 3 cycles, he tolerated with moderate/severe difficulties.  Labs are reviewed and discussed with patient. Proceed with dose reduced FOLFOX today - [oxaliplatin 65mg /m2, omit 5-FU bolus]

## 2023-08-07 NOTE — Progress Notes (Signed)
Hematology/Oncology Progress note [phone] [phone]    CHIEF COMPLAINTS/REASON FOR VISIT:  Follow up for right lower extremity DVT, antiphospholipid syndrome, metastatic cholangiocarcinoma.  ASSESSMENT & PLAN:   Cancer Staging  Cholangiocarcinoma [facility]) Staging form: Intrahepatic Bile Duct, AJCC 8th Edition - Clinical stage from 08/27/2022: Stage IV (cT2, cN1, cM1) - Signed by [name], MD on 09/13/2022   Cholangiocarcinoma (HCC) 6Cholangiocarcinoma, NGS-[facility] liquid biopsy FGFR2 -ADK mutation, TMB 0,  S/p Gemcitabine, Cisplatin and Durvalumab x 6 , 01/2023 CT progression/enlarged liver lesions --03/03/23 2nd line Futibatinib- 04/26/23 CT showed interval progression  Recommend patient to stop Futibatinib. [facility] 2nd opinion was reviewed.  Recommend to proceed with 3rd line treatment with FOLFOX, CT showed mixed response after 3 cycles, he tolerated with moderate/severe difficulties.  Labs are reviewed and discussed with patient. Proceed with dose reduced FOLFOX today - [oxaliplatin 65mg /m2, omit 5-FU bolus]    Antiphospholipid syndrome (HCC) #Recurrent lower extremity DVT, secondary to antiphospholipid syndrome.-. Need  life time anticoagulation Currently on coumadin with INR goal of 2-3.   INR pending  INR result will be sent to PCP's office for coumadin dosing.    Hypocalcemia Continue calcium supplementation, currently on 1000 mg twice daily.   Recommend Vitamin D 1000 units daily.   Metastasis to bone [facility]) S/p palliative  radiation  Consider bisphosphonate after dental clearance is obtained. S/p left ischium.  radiation due to concern of pathological fracture.  Right thigh pain, no RT per Radonc. Felt was due to arthritis.  CT showed worse of bone metastatic disease, T12 epidural tumor extension- MRI thoracic and lumbar spine showed T12 epidural paraspinal tumor extension.  Follow up with Radonc for radiation of   T12   Hypokalemia Recommend patient continue potassium chloride supplementation.    Orders Placed This Encounter  Procedures   Protime-INR    Standing Status:   Future    Standing Expiration Date:   08/20/2024   Cancer antigen 19-9    Standing Status:   Future    Standing Expiration Date:   08/20/2024   CEA    Standing Status:   Future    Standing Expiration Date:   08/20/2024   CBC with Differential (Cancer Center Only)    Standing Status:   Future    Standing Expiration Date:   08/20/2024   CMP (Cancer Center only)    Standing Status:   Future    Standing Expiration Date:   08/20/2024   Protime-INR    Standing Status:   Future    Standing Expiration Date:   09/03/2024   Cancer antigen 19-9    Standing Status:   Future    Standing Expiration Date:   09/03/2024   CEA    Standing Status:   Future    Standing Expiration Date:   09/03/2024   CBC with Differential (Cancer Center Only)    Standing Status:   Future    Standing Expiration Date:   09/03/2024   CMP (Cancer Center only)    Standing Status:   Future    Standing Expiration Date:   09/03/2024   Protime-INR    Standing Status:   Future    Standing Expiration Date:   09/17/2024   Cancer antigen 19-9    Standing Status:   Future    Standing Expiration Date:   09/17/2024   CEA    Standing Status:   Future    Standing Expiration Date:   09/17/2024   CBC with Differential (Cancer Center  Only)    Standing Status:   Future    Standing Expiration Date:   09/17/2024   CMP (Cancer Center only)    Standing Status:   Future    Standing Expiration Date:   09/17/2024   Protime-INR    Standing Status:   Future    Standing Expiration Date:   08/06/2024   Follow up 2 weeks lab MD chemo  All questions were answered. The patient knows to call the clinic with any problems, questions or concerns.  [name], MD, PhD [facility] Hematology Oncology 08/07/2023    HISTORY OF PRESENTING ILLNESS:  Patient reports remote history  of left lower extremity DVT in 2002.  He was initiated on Lovenox and bridged to Coumadin.  Patient took warfarin for 2 years before anticoagulation was stopped. 12/03/2021, patient presented to emergency room for evaluation of right lower extremity pain and swelling for about a week.  Started on the inner side of right thigh and migrated to the right calf. + Associated with swelling.  Denies any recent injury, hospitalization, surgery.  He first noticed the symptoms after playing basketball with his grandson.   12/03/2021, right lower extremity ultrasound showed occlusive DVT extending from the mid aspect of the femoral vein through the imaged tibial vein.  Age-indeterminate. Patient was started on Xarelto. He was referred to establish care with vascular surgeon and was seen by Sheppard Plumber on 12/06/2021.  Shared decision was made not to proceed with embolectomy.  Continue anticoagulation. Patient was referred to hematology oncology for further evaluation.  Patient denies any family history of blood clots.  Denies any unintentional weight loss, fever or night sweats. 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  He sometimes stay in his car while waiting for next assessment appointment.  He reports the right lower extremity symptom has improved since the start of Xarelto.  No active bleeding events.  #12/18/2021 hypercoagulable work-up showed JAK2 V617F mutation negative, with reflex to other mutations CALR, MPL, JAK 2 Ex 12-15 mutations negative, negative anticardiolipin IgG and IgM antibodies, positive lupus anticoagulant, negative factor V Leiden mutation, negative prothrombin gene mutation, normal protein C activity, normal protein S antigen level. Patient was recommended to switch to Coumadin with INR goal of 2-3.  #03/15/2022, repeat lupus anticoagulant is persistently positive- + antiphospholipid syndrome.   Patient is currently on Lovenox 1 mg/kg twice daily.  admitted due to multifocal pneumonia, treated with Influenza panel negative, COVID-negative, HIV negative, Complete course of antibiotics with Zithromax, Vantin   + HCV antibody positive, HCV RNA quantification not detected. HCV RT PCR not detected.   INTERVAL HISTORY Christopher Marsh is a 62 y.o. male who has above history reviewed by me today presents for follow up visit for management of right lower extremity DVT and antiphospholipid syndrome, metastatic cholangiocarcinoma.  Oncology History  Cholangiocarcinoma (HCC)  10/30/2021 Imaging   CT chest wo contrast 1. Heterogeneous bilateral ground-glass disease, multilobar but worst in the right middle lobe, lingula and lower lobes. There is progression of the abnormality since 10/12/2022. Though pneumonia could produce this appearance, given persistence and fluctuating appearance since October, consider alternative entities such as hypersensitivity pneumonitis or idiopathic interstitial pneumonia. 2. Slightly enlarged pulmonary trunk, possible arterial hypertension 3. Redemonstrated large hypodense right hepatic lobe mass.   08/18/2022 Imaging   CT chest w contrast showed 1. Multifocal airspace disease in both lungs, left side greater than right. Findings  are most compatible with multifocal pneumonia. 2. Multiple new hepatic lesions with enlarging lymph nodes in the upper abdomen and chest. Findings are concerning for metastatic disease. Based on the multifocal pneumonia, hepatic abscesses would also be in the differential diagnosis. Recommend further characterization of the abdomen and pelvis with CT with IV contrast. 3. Enlargement of the main pulmonary artery could be associated with pulmonary hypertension. 4. Coronary artery calcifications. 5. Multinodular goiter. Patient has known thyroid nodules and previous thyroid ultrasound.    08/18/2022 Imaging   CT abdomen pelvis w contrast   Interval development of 6.5 x 5.2 cm heterogeneously enhancing mass in right hepatic lobe. This is highly concerning for neoplasm or malignancy, and further evaluation with MRI is recommended.   Mildly enlarged periaortic adenopathy is noted, with the largest lymph node measuring 12 mm. Metastatic disease cannot be excluded. Mildly enlarged adenopathy is also noted in the gastrohepatic ligament, but this is unchanged compared to prior exam.   Stable bibasilar lung opacities are noted concerning for inflammation, atelectasis or possibly scarring.   Grossly stable multi-septated cystic lesion is seen involving upper pole of right kidney with peripheral calcifications compared to prior exam of 2019, most likely representing benign etiology.   Aortic Atherosclerosis    08/18/2022 - 08/21/2022 Hospital Admission   Admitted due to multifocal pneumonia, treated with Influenza panel negative, COVID-negative, HIV negative, Complete course of antibiotics with Zithromax, Vantin     08/19/2022 Imaging   MR abdomen w wo contrast  Marked caudate lobe hypertrophy, highly suspicious for hepatic cirrhosis.   Numerous hypervascular masses throughout the right hepatic lobe, highly suspicious for multifocal hepatocellular carcinoma. Recommend correlation with AFP and consider tissue sampling.   Mild abdominal lymphadenopathy, with differential diagnosis including metastatic disease and reactive lymphadenopathy in setting of cirrhosis.   Bilateral lower lobe infiltrates, as better demonstrated on recent CT.    08/19/2022 Tumor Marker   AFP 4.1   08/27/2022 Initial Diagnosis   Cholangiocarcinoma  -08/20/22 Liver mass biopsy showed poorly differentiated carcinoma Immunohistochemical stains show that the poorly differentiated carcinoma is positive for CK7 and MOC-31, suggestive of a poorly differentiated adenocarcinoma.  Immunohistochemical stains for CK20, CDX2, HepPar 1 and arginase are negative.   Immunostain for glypican-3 shows very focal  likely nonspecific labeling.  This immunoprofile is nonspecific but can be compatible with a poorly differentiated primary cholangiocarcinoma in absence of any other lesions.   NGS showed BAP 1 mutation.  Case was presented at tumor board, I have also discussed with pathologist Dr.Rubinas,  IHC pattern is more consistent with adenocarcinoma, unlikely HCC [due to negative HepPar 1 and arginase], cholangiocarcinoma is favored, although this is a diagnosis of exclusion. Second opinion ar [facility]   08/27/2022 Cancer Staging   Staging form: Intrahepatic Bile Duct, AJCC 8th Edition - Clinical stage from 08/27/2022: Stage IV (cT2, cN1, cM1) - Signed by [name], MD on 09/13/2022 Stage prefix: Initial diagnosis   08/27/2022 Tumor Marker   CA 19.9  16 CEA 2.8   09/12/2022 Imaging   PET scan showed 1. Large right hepatic lobe mass is hypermetabolic and consistent with known cholangiocarcinoma. 2. Scattered borderline enlarged upper abdominal lymph nodes do not show any significant FDG uptake. 3. 4 hypermetabolic bone lesions consistent with metastatic disease. 4. Progressive diffuse airspace process in the left lung could be due to atypical pneumonia, pulmonary hemorrhage or left-sided aspiration. 5. Aortic atherosclerosis.  09/12/2022 I had a phone discussion with patient after his PET scan resulted.  Recommend systemic  chemotherapy.  Patient would like to defer until his second opinion visit at [facility].   09/15/2022 Imaging   CT angio chest pulmonary embolism protocol showed 1. No evidence for pulmonary embolism. 2. Progression of bilateral multifocal patchy ground-glass and interstitial opacities, left greater than right. Findings are concerning for multifocal pneumonia. 3. Air-fluid levels in the left lower lobe bronchi worrisome for aspiration. 4. Mediastinal and hilar lymphadenopathy has increased in size and number when compared to the prior study,  likely reactive.5. Cardiomegaly. 6. Findings compatible with pulmonary artery hypertension.7. Stable right liver mass and complex right renal cystic lesion. 8. Stable enlargement of the left thyroid.   09/15/2022 - 09/17/2022 Hospital Admission   Patient was admitted due to shortness of breath, CT showed progression of bilateral multifocal patchy groundglass and interstitial opacities.  Left greater than right.  Concerning for multifocal pneumonia.  Patient was started on broad-spectrum antibiotics with significant improvement.  He was discharged on oral antibiotics.   09/19/2022 Miscellaneous   Patient went to [facility] for second opinion.  He was seen by Shirlee Latch, who felt that the presentation is consistent with metastatic cholangiocarcinoma, and agrees with the plan of cisplatin/gemcitabine/durvalumab..    10/11/2022 - 01/31/2023 Chemotherapy   Patient is on Treatment Plan : Cisplatin D1,8 + Gemcitabine + Durvalumab  D1,8 q21d x     10/12/2022 Imaging   CT chest w contrast 1. Bilateral airspace and ground-glass opacities have mildly decreased in amount and density when compared to the prior study. Findings are compatible with resolving multifocal pneumonia. Continued follow-up imaging recommended. 2. Stable enlarged heterogeneous left thyroid gland with 3.2 cm nodule. This can be further evaluated with dedicated ultrasound. 3. Stable hepatic and right renal lesions, incompletely evaluated.   Aortic Atherosclerosis (ICD10-I70.0).   12/11/2022 Imaging   PET showed  1. There is been mild increase in size of the tracer avid mass within right lobe of liver compatible with known cholangiocarcinoma. The degree of tracer uptake however is slightly decreased in the interval. 2. Multifocal tracer avid bone metastases are again noted. When compared with the previous exam there has been an mixed interval response to therapy. Specifically, there has been interval increase in size and FDG uptake  associated with the right ischial tuberosity lesion and there is a new lesion within the posterior left iliac bone. Decreased tracer uptake is associated with the left scapular lesion and there is stable tracer uptake associated with the right T1 transverse process lesion. 3. Persistent ground-glass and airspace densities within the left upper lobe and left lower lobe compatible with inflammatory or infectious process. Interval resolution of previous right lung ground-glass and airspace densities.    12/24/2022 Procedure   Medi port placement by Dr. Wyn Quaker   01/22/2023 Imaging   CT chest abdomen pelvis with contrast 1. Hypodense lesion of the right lobe of the liver, hepatic segment VII/VIII is slightly diminished in size, consistent with treatment response. 2. No evident change in additional smaller hypodense lesions scattered throughout the right lobe of the liver, these more difficult to see on prior noncontrast PET-CT. 3. Unchanged enlarged celiac axis, gastrohepatic ligament, and retroperitoneal lymph nodes. Unchanged prominent high right paratracheal lymph nodes. 4. Unchanged osseous metastases. 5. Slightly improved, although still extensive ground-glass airspace opacity primarily seen throughout the left lower lobe and lingula,nonspecific and infectious or inflammatory. 6. Coronary artery disease. 7. Emphysema.   02/11/2023 Imaging   Patient self decreased Lovenox dosage to 120mg  once daily.  Increased lower extremity swelling.  02/11/23 [place] bilateral lower extremity showed 1. Nonocclusive DVT within the popliteal veins bilaterally. 2. Occlusive DVT within the posterior tibial veins of the right calf.       02/13/2023 Imaging   CT chest Angiogram  1. Technically adequate exam showing no acute pulmonary embolus. 2. Cardiomegaly. Coronary artery calcifications. 3. Bilateral pleural effusions. 4. Improved aeration of the RIGHT UPPER and LOWER lobes compared to prior study. 5. LEFT LOWER  lobe and lingula airspace filling and atelectasis, slightly improved compared to prior study. 6. Cirrhotic morphology of the liver. 7.7 centimeters mass in the RIGHT hepatic lobe. 7. Gastrohepatic ligament adenopathy. 8. A 7 millimeters sclerotic lesion in the UPPER sternum is suspicious for metastatic disease. 9. LEFT thyroid goiter.   02/13/2023 - 02/15/2023 Hospital Admission   A flutter with variable A-V block. Heart rate up to 140.  BNP 119.8 04/18: Echo no overt CHF - EF 50-55  Cardiology recs beta blocker to metoprolol succinate 50 mg bid, Torsemid 40mg  daily Adjusting Lovenox 14mg  BID.    03/03/2023 - 05/03/2023 Chemotherapy   Futibatinib 20mg , 03/12/23 Decreased to 16mg  daily Stopped in early July due to cancer progression.   04/24/2023 - 04/30/2023 Radiation Therapy   left ischium radiation.    05/20/2023 -  Chemotherapy   Patient is on Treatment Plan : COLORECTAL FOLFOX q14d      INTERVAL HISTORY Christopher Marsh is a 62 y.o. male who has above history reviewed by me today presents for follow up visit for Cholangiocarcinoma, antiphospholipid syndrome, thrombosis + Fatigue, Appetite is better, gained weight.  He follows up with PCP'f office for coumadin dosing.  Denies any bleeding events.  + appetite is better, with fair oral intake.  + evaluated by PT and recommendation is to ambulate with walker.    Review of Systems  Constitutional:  Positive for fatigue and unexpected weight change. Negative for appetite change, chills and fever.  HENT:   Negative for hearing loss and voice change.   Eyes:  Negative for eye problems.  Respiratory:  Negative for chest tightness, cough and shortness of breath.   Cardiovascular:  Positive for leg swelling. Negative for chest pain.  Gastrointestinal:  Negative for abdominal distention, abdominal pain, blood in stool and diarrhea.  Endocrine: Negative for hot flashes.  Genitourinary:  Negative for difficulty urinating and frequency.    Musculoskeletal:  Negative for arthralgias.  Skin:  Negative for itching.  Neurological:  Negative for extremity weakness.  Hematological:  Negative for adenopathy. Bruises/bleeds easily.  Psychiatric/Behavioral:  Negative for confusion.     MEDICAL HISTORY:  Past Medical History:  Diagnosis Date   Antiphospholipid antibody syndrome (HCC)    Arthritis    Cancer (HCC)    Diabetes mellitus without complication (HCC)    DVT (deep venous thrombosis) (HCC)    Lt leg   Dyspnea    Elevated lipids    Erythrocytosis    Hyperlipidemia    Hypertension    Lower extremity edema    Multinodular thyroid    Post-thrombotic syndrome     SURGICAL HISTORY: Past Surgical History:  Procedure Laterality Date   CARDIOVERSION N/A 03/22/2023   Procedure: CARDIOVERSION;  Surgeon: Antonieta Iba, MD;  Location: [facility] ORS;  Service: Cardiovascular;  Laterality: N/A;   COLONOSCOPY WITH PROPOFOL N/A 09/23/2018   Per Dr. Norma Fredrickson, polyp, repeat in 3 yrs    EYE MUSCLE SURGERY     JOINT REPLACEMENT Left 07/22/2017   PORTA CATH INSERTION N/A 12/24/2022   Procedure: Shelda Pal  CATH INSERTION;  Surgeon: Annice Needy, MD;  Location: [facility] INVASIVE CV LAB;  Service: Cardiovascular;  Laterality: N/A;   TOTAL HIP ARTHROPLASTY Left 07/22/2017   Procedure: TOTAL HIP ARTHROPLASTY;  Surgeon: Donato Heinz, MD;  Location: [facility] ORS;  Service: Orthopedics;  Laterality: Left;    SOCIAL HISTORY: Social History   Socioeconomic History   Marital status: Married    Spouse name: Not on file   Number of children: Not on file   Years of education: Not on file   Highest education level: Not on file  Occupational History   Not on file  Tobacco Use   Smoking status: Former    Current packs/day: 0.00    Average packs/day: 0.5 packs/day for 6.0 years (3.0 ttl pk-yrs)    Types: Cigarettes    Start date: 8    Quit date: 65    Years since quitting: 42.8   Smokeless tobacco: Never   Tobacco comments:    Pt states when  he did smoke he smoked at most 1 ppd. ALS 09/26/2022  Vaping Use   Vaping status: Never Used  Substance and Sexual Activity   Alcohol use: Not Currently    Comment: occ   Drug use: No   Sexual activity: Not on file  Other Topics Concern   Not on file  Social History Narrative   Not on file   Social Determinants of Health   Financial Resource Strain: Low Risk  (08/27/2022)   Overall Financial Resource Strain (CARDIA)    Difficulty of Paying Living Expenses: Not very hard  Food Insecurity: No Food Insecurity (02/18/2023)   Hunger Vital Sign    Worried About Running Out of Food in the Last Year: Never true    Ran Out of Food in the Last Year: Never true  Transportation Needs: No Transportation Needs (02/18/2023)   PRAPARE - Administrator, Civil Service (Medical): No    Lack of Transportation (Non-Medical): No  Physical Activity: Insufficiently Active (08/27/2022)   Exercise Vital Sign    Days of Exercise per Week: 3 days    Minutes of Exercise per Session: 30 min  Stress: Stress Concern Present (08/27/2022)   [employer] of Occupational Health - Occupational Stress Questionnaire    Feeling of Stress : Rather much  Social Connections: Socially Integrated (08/27/2022)   Social Connection and Isolation Panel [NHANES]    Frequency of Communication with Friends and Family: More than three times a week    Frequency of Social Gatherings with Friends and Family: More than three times a week    Attends Religious Services: 1 to 4 times per year    Active Member of [employer] or Organizations: Yes    Attends Banker Meetings: Never    Marital Status: Married  Catering manager Violence: Not At Risk (02/14/2023)   Humiliation, Afraid, Rape, and Kick questionnaire    Fear of Current or Ex-Partner: No    Emotionally Abused: No    Physically Abused: No    Sexually Abused: No    FAMILY HISTORY: Family History  Problem Relation Age of Onset   Breast cancer Mother     Emphysema Father    Colon cancer Maternal Grandmother     ALLERGIES:  has No Known Allergies.  MEDICATIONS:  Current Outpatient Medications  Medication Sig Dispense Refill   albuterol (VENTOLIN HFA) 108 (90 Base) MCG/ACT inhaler TAKE 2 PUFFS BY MOUTH EVERY 4 HOURS AS NEEDED 8.5 each 1  Calcium Carbonate 500 MG CHEW Chew 2 tablets (1,000 mg total) by mouth daily. 60 tablet 0   diphenoxylate-atropine (LOMOTIL) 2.5-0.025 MG tablet Take 1 tablet by mouth 4 (four) times daily as needed for diarrhea or loose stools. 120 tablet 3   fentaNYL (DURAGESIC) 50 MCG/HR Place 1 patch onto the skin every 3 (three) days. 10 patch 0   glipiZIDE (GLUCOTROL) 10 MG tablet Take 1 tablet (10 mg total) by mouth 2 (two) times daily before a meal. (Patient taking differently: Take 10 mg by mouth daily before breakfast.) 180 tablet 3   glucose blood test strip 1 each by Other route as needed for other. accu chek aviva plusUse as instructed     ketoconazole (NIZORAL) 2 % cream Apply 1 Application topically 2 (two) times daily as needed for irritation. 60 g 5   Lactulose 20 GM/30ML SOLN Take 30 mLs (20 g total) by mouth daily as needed (constipation.). 450 mL 0   lidocaine-prilocaine (EMLA) cream Apply 1 Application topically as needed. 30 g 5   LORazepam (ATIVAN) 0.5 MG tablet TAKE 1 TABLET (0.5 MG TOTAL) BY MOUTH EVERY 8 (EIGHT) HOURS AS NEEDED FOR ANXIETY (NAUSEA VOMITING). 60 tablet 0   metFORMIN (GLUCOPHAGE) 500 MG tablet TAKE TWO TABLETS BY MOUTH EVERY MORNING WITH BREAKFAST 180 tablet 1   metoprolol tartrate (LOPRESSOR) 50 MG tablet Take 1 tablet (50 mg total) by mouth 2 (two) times daily. 180 tablet 3   MUCINEX D MAX STRENGTH 3074630423 MG TB12 Take 1,200 mg by mouth 2 (two) times daily as needed (for congestion or to loosen mucous in the chest).     ondansetron (ZOFRAN) 4 MG tablet Take by mouth.     pravastatin (PRAVACHOL) 20 MG tablet Take 1 tablet (20 mg total) by mouth daily. 90 tablet 3    prochlorperazine (COMPAZINE) 10 MG tablet Take 10 mg by mouth every 6 (six) hours as needed.     silver sulfADIAZINE (SILVADENE) 1 % cream Apply 1 Application topically daily. 50 g 0   SLOW-MAG 71.5-119 MG TBEC SR tablet TAKE 1 TABLET (64 MG TOTAL) BY MOUTH DAILY 60 tablet 1   tadalafil (CIALIS) 20 MG tablet Take 0.5-1 tablets (10-20 mg total) by mouth every other day as needed for erectile dysfunction. 20 tablet 11   traZODone (DESYREL) 50 MG tablet TAKE 1 TABLET BY MOUTH AT BEDTIME AS NEEDED FOR SLEEP. 90 tablet 0   triamcinolone cream (KENALOG) 0.1 % Apply 1 Application topically 3 (three) times daily. 80 g 5   warfarin (COUMADIN) 5 MG tablet Take 1 tablet (5 mg total) by mouth daily. 30 tablet 5   oxyCODONE (OXY IR/ROXICODONE) 5 MG immediate release tablet Take 1 tablet (5 mg total) by mouth every 4 (four) hours as needed for severe pain. 60 tablet 0   potassium chloride (KLOR-CON) 10 MEQ tablet Take 1 tablet (10 mEq total) by mouth daily. 90 tablet 3   torsemide (DEMADEX) 20 MG tablet Take 1 tablet (20 mg total) by mouth 2 (two) times daily as needed. (Patient not taking: Reported on 07/08/2023) 180 tablet 3   No current facility-administered medications for this visit.   Facility-Administered Medications Ordered in Other Visits  Medication Dose Route Frequency Provider Last Rate Last Admin   fluorouracil (ADRUCIL) 5,750 mg in sodium chloride 0.9 % 135 mL chemo infusion  2,400 mg/m2 (Order-Specific) Intravenous 1 day or 1 dose [name], MD       leucovorin 956 mg in dextrose 5 % 250  mL infusion  400 mg/m2 (Order-Specific) Intravenous Once [name], MD 149 mL/hr at 08/07/23 1115 956 mg at 08/07/23 1115   oxaliplatin (ELOXATIN) 150 mg in dextrose 5 % 500 mL chemo infusion  65 mg/m2 (Order-Specific) Intravenous Once [name], MD 265 mL/hr at 08/07/23 1117 150 mg at 08/07/23 1117   sodium chloride flush (NS) 0.9 % injection 10 mL  10 mL Intravenous PRN [name], MD   10 mL at 07/18/23 0951      PHYSICAL EXAMINATION: ECOG PERFORMANCE STATUS: 1 - Symptomatic but completely ambulatory Vitals:   08/07/23 0839  BP: 135/72  Pulse: 64  Resp: 18  Temp: 98 F (36.7 C)   Filed Weights   08/07/23 0839  Weight: 239 lb 11.2 oz (108.7 kg)    Physical Exam Constitutional:      General: He is not in acute distress. HENT:     Head: Normocephalic and atraumatic.  Eyes:     General: No scleral icterus. Cardiovascular:     Rate and Rhythm: Normal rate.  Pulmonary:     Effort: Pulmonary effort is normal. No respiratory distress.     Breath sounds: No wheezing.  Abdominal:     General: Bowel sounds are normal. There is no distension.  Musculoskeletal:        General: Normal range of motion.     Cervical back: Normal range of motion and neck supple.     Right lower leg: Edema present.     Left lower leg: Edema present.  Skin:    General: Skin is warm and dry.     Findings: No erythema.  Neurological:     Mental Status: He is alert and oriented to person, place, and time. Mental status is at baseline.  Psychiatric:        Mood and Affect: Mood normal.     LABORATORY DATA:  I have reviewed the data as listed    Latest Ref Rng & Units 08/07/2023    7:54 AM 07/24/2023    8:11 AM 07/18/2023    9:45 AM  CBC  WBC 4.0 - 10.5 K/uL 4.2  3.8  4.0   Hemoglobin 13.0 - 17.0 g/dL 16.1  09.6  04.5   Hematocrit 39.0 - 52.0 % 31.9  31.9  33.8   Platelets 150 - 400 K/uL 130  112  125       Latest Ref Rng & Units 08/07/2023    7:54 AM 07/24/2023    8:11 AM 07/18/2023    9:45 AM  CMP  Glucose 70 - 99 mg/dL 91  409  811   BUN 8 - 23 mg/dL 8  7  7    Creatinine 0.61 - 1.24 mg/dL 9.14  7.82  9.56   Sodium 135 - 145 mmol/L 135  132  134   Potassium 3.5 - 5.1 mmol/L 3.7  3.7  3.9   Chloride 98 - 111 mmol/L 105  101  100   CO2 22 - 32 mmol/L 25  25  25    Calcium 8.9 - 10.3 mg/dL 8.7  8.6  8.9   Total Protein 6.5 - 8.1 g/dL 5.8  5.6  5.7   Total Bilirubin 0.3 - 1.2 mg/dL 1.1  0.9  1.1    Alkaline Phos 38 - 126 U/L 100  104  117   AST 15 - 41 U/L 31  34  33   ALT 0 - 44 U/L 16  17  18      RADIOGRAPHIC  STUDIES: I have personally reviewed the radiological images as listed and agreed with the findings in the report. MR Lumbar Spine W Wo Contrast  Result Date: 07/18/2023 CLINICAL DATA:  spine lesions, lower extremity weakness. History of gallbladder cancer with back pain and bilateral leg weakness. EXAM: MRI THORACIC AND LUMBAR SPINE WITHOUT AND WITH CONTRAST TECHNIQUE: Multiplanar and multiecho pulse sequences of the thoracic and lumbar spine were obtained without and with intravenous contrast. CONTRAST:  10mL GADAVIST GADOBUTROL 1 MMOL/ML IV SOLN COMPARISON:  CT chest/abdomen/pelvis 07/16/2023. FINDINGS: MRI THORACIC SPINE FINDINGS Alignment:  Normal. Vertebrae: Multiple enhancing lesions in the thoracic spine, including the right aspect of the T4 and T9 vertebral bodies, as well as the posterior elements of T12 and spinous process of T4. No epidural or paraspinal extension of tumor. Cord: Normal spinal cord signal and volume. No abnormal enhancement. Paraspinal and other soft tissues: No acute findings. See recent body CT report for description of findings in the chest. Disc levels: No significant disc herniation or spinal canal stenosis. Right-greater-than-left facet arthropathy at T11-12 results in moderate right neural foraminal narrowing. MRI LUMBAR SPINE FINDINGS Segmentation:  Standard. Alignment:  Normal. Vertebrae: Congenitally short pedicles throughout the lumbar spine. Heterogeneously enhancing lesion along the right aspect of the inferior endplate of L4, possibly an acute Schmorl's node versus site of metastasis. Enhancing lesion in the spinous process of L5. Enhancing lesion in the left posterior elements of T12 with mild extension into the left dorsal epidural space (axial image 2 series 4 and 7) and mild paraspinal extension. Enhancing lesions in the right sacral ala at the  S1 level and right iliac crest. Conus medullaris: Extends to the L1-2 level and appears normal. No abnormal enhancement of the cauda equina nerve roots. Paraspinal and other soft tissues: No acute findings. See recent body CT report for description of intra-abdominal and pelvic findings. Disc levels: Disc bulge and facet arthropathy results in severe spinal canal stenosis at L4-5. Left eccentric disc bulge compresses the traversing left S1 nerve root in the subarticular zone at L5-S1. IMPRESSION: 1. Multiple enhancing lesions in the thoracic and lumbar spine, right sacral ala, and right iliac crest, consistent with metastatic disease. 2. Enhancing lesion in the left posterior elements of T12 with mild extension into the left dorsal epidural space and mild paraspinal extension. No significant spinal canal stenosis. 3. Severe spinal canal stenosis at L4-5. 4. Left eccentric disc bulge compresses the traversing left S1 nerve root in the subarticular zone at L5-S1. Electronically Signed   By: Orvan Falconer M.D.   On: 07/18/2023 15:11   MR Thoracic Spine W Wo Contrast  Result Date: 07/18/2023 CLINICAL DATA:  spine lesions, lower extremity weakness. History of gallbladder cancer with back pain and bilateral leg weakness. EXAM: MRI THORACIC AND LUMBAR SPINE WITHOUT AND WITH CONTRAST TECHNIQUE: Multiplanar and multiecho pulse sequences of the thoracic and lumbar spine were obtained without and with intravenous contrast. CONTRAST:  10mL GADAVIST GADOBUTROL 1 MMOL/ML IV SOLN COMPARISON:  CT chest/abdomen/pelvis 07/16/2023. FINDINGS: MRI THORACIC SPINE FINDINGS Alignment:  Normal. Vertebrae: Multiple enhancing lesions in the thoracic spine, including the right aspect of the T4 and T9 vertebral bodies, as well as the posterior elements of T12 and spinous process of T4. No epidural or paraspinal extension of tumor. Cord: Normal spinal cord signal and volume. No abnormal enhancement. Paraspinal and other soft tissues: No  acute findings. See recent body CT report for description of findings in the chest. Disc levels: No significant disc herniation  or spinal canal stenosis. Right-greater-than-left facet arthropathy at T11-12 results in moderate right neural foraminal narrowing. MRI LUMBAR SPINE FINDINGS Segmentation:  Standard. Alignment:  Normal. Vertebrae: Congenitally short pedicles throughout the lumbar spine. Heterogeneously enhancing lesion along the right aspect of the inferior endplate of L4, possibly an acute Schmorl's node versus site of metastasis. Enhancing lesion in the spinous process of L5. Enhancing lesion in the left posterior elements of T12 with mild extension into the left dorsal epidural space (axial image 2 series 4 and 7) and mild paraspinal extension. Enhancing lesions in the right sacral ala at the S1 level and right iliac crest. Conus medullaris: Extends to the L1-2 level and appears normal. No abnormal enhancement of the cauda equina nerve roots. Paraspinal and other soft tissues: No acute findings. See recent body CT report for description of intra-abdominal and pelvic findings. Disc levels: Disc bulge and facet arthropathy results in severe spinal canal stenosis at L4-5. Left eccentric disc bulge compresses the traversing left S1 nerve root in the subarticular zone at L5-S1. IMPRESSION: 1. Multiple enhancing lesions in the thoracic and lumbar spine, right sacral ala, and right iliac crest, consistent with metastatic disease. 2. Enhancing lesion in the left posterior elements of T12 with mild extension into the left dorsal epidural space and mild paraspinal extension. No significant spinal canal stenosis. 3. Severe spinal canal stenosis at L4-5. 4. Left eccentric disc bulge compresses the traversing left S1 nerve root in the subarticular zone at L5-S1. Electronically Signed   By: Orvan Falconer M.D.   On: 07/18/2023 15:11   CT CHEST ABDOMEN PELVIS W CONTRAST  Result Date: 07/17/2023 CLINICAL DATA:   Cholangiocarcinoma restaging. Prior chemotherapy and radiation therapy. * Tracking Code: BO * EXAM: CT CHEST, ABDOMEN, AND PELVIS WITH CONTRAST TECHNIQUE: Multidetector CT imaging of the chest, abdomen and pelvis was performed following the standard protocol during bolus administration of intravenous contrast. RADIATION DOSE REDUCTION: This exam was performed according to the departmental dose-optimization program which includes automated exposure control, adjustment of the mA and/or kV according to patient size and/or use of iterative reconstruction technique. CONTRAST:  ISOVUE-300 IOPAMIDOL (ISOVUE-300) INJECTION 61% COMPARISON:  04/25/2023 FINDINGS: CT CHEST FINDINGS Cardiovascular: Right Port-A-Cath tip: Lower SVC. Atheromatous vascular calcification of the thoracic aorta and coronary arteries. Borderline cardiomegaly. Mediastinum/Nodes: Stable left thyroid nodule 3.5 cm. In the setting of significant comorbidities or limited life expectancy, no follow-up recommended (ref: J Am Coll Radiol. 2015 Feb;12(2): 143-50).Right paratracheal node 0.8 cm in short axis, formerly 1.1 cm, image 18 series 3. Another right paratracheal lymph node measures 0.8 cm on image 23 series 3, formerly 1.0 cm. Additional upper normal sized subcarinal infrahilar lymph nodes. No current overt pathologic adenopathy in the chest. Lungs/Pleura: Mild biapical pleuroparenchymal scarring. Mild scarring in the posterior basal segments of both lower lobes. Stable lingular scarring. The patchy opacities in the lingula and left lower lobe shown on the prior exam have resolved. Musculoskeletal: Enlarging lytic lesion of the T4 vertebral body eccentric to the right, 3.0 cm in long axis on image 107 series 8, formerly 1.9 cm in long axis. Enlarging lytic lesion in the T9 vertebral body. Essentially stable sclerotic lesion in the sternal body. New lytic lesion, cannot exclude pathologic fracture, posterolaterally in the ninth rib, image 130  series 5. Healing fracture of the right eighth rib laterally, nonspecific for underlying pathologic fracture, this could simply be a benign subacute rib fracture. Enlarging mixed lytic and sclerotic lesion of the left scapula, image 19 series  5. CT ABDOMEN PELVIS FINDINGS Hepatobiliary: Dominant right hepatic lobe mass measures 7.4 by 5.8 cm on image 65 series 3, formerly 7.1 by 6.8 cm, roughly stable. Other stable tiny hypodense lesions in the right hepatic lobe and in segment 2 of the left hepatic lobe, nonspecific. Prominent caudate lobe, suspicion for early cirrhosis. Gallbladder unremarkable. No biliary dilatation. Patent portal vein. Pancreas: Unremarkable Spleen: Spleen measures 13.1 by 8.0 by 12.8 cm (volume = 700 cm^3), compatible with splenomegaly. No focal splenic lesion identified. Adrenals/Urinary Tract: Stable benign renal cystic lesions including the lesion with calcified septations in the right upper lobe compatible with benign Bosniak category 2 cyst based on previous and current imaging workup assessments. No further imaging workup of these lesions is indicated. Adrenal glands normal. Left distal ureter an urinary bladder partially obscured by streak artifact from the patient's left hip implant. No hydronephrosis or hydroureter. Stomach/Bowel: Focal high density in the small bowel at 1.2 cm on image 116 series 3, probably a high density pill fragment, less likely to be an intra lith. Otherwise unremarkable. Vascular/Lymphatic: Atherosclerosis is present, including aortoiliac atherosclerotic disease. Right gastric lymph nodes observed, including a 2.7 by 1.3 cm node on image 66 of series 3, previously 3.7 by 1.7 cm by my measurements, accordingly moderately reduced in size from previous. Aortocaval node 1.1 cm in short axis on image 81 series 3, formerly same. Other scattered smaller retroperitoneal and mesenteric lymph nodes are present. Reproductive: Mild prostatomegaly. Prostate gland partially  obscured by streak artifact from the patient's left hip implant. Other: Mild ascites and scattered mesenteric edema. Confluent left upper quadrant omental edema without overt omental nodularity. Musculoskeletal: Enlarging lytic metastatic lesions including the left T12 pedicle, the right L4 inferior endplate, the L5 spinous process, the right hip quadrilateral plate, the bilateral ischium and the left posterior acetabulum. Mild increase in size of the sclerotic lesion in the right iliac bone on image 105 series 3. Cannot exclude epidural extension of tumor adjacent to the T12 posterior element involvement. IMPRESSION: 1. Mixed response to therapy. The dominant right hepatic lobe mass is roughly stable in size. The right gastric lymph node has moderately reduced in size. The right paratracheal lymph nodes have decreased in size. However, there are multiple enlarging lytic lesions in the axial and appendicular skeleton, including the T4 vertebral body, the T9 vertebral body, the left scapula, the right hip quadrilateral plate, the bilateral ischium, and the left posterior acetabulum. Cannot exclude epidural extension of tumor adjacent to the T12 posterior element involvement. 2. Healing fracture of the right eighth rib laterally, nonspecific for underlying pathologic fracture, this could simply be a benign subacute rib fracture. 3. Mild ascites and scattered mesenteric edema. Confluent left upper quadrant omental edema without overt omental nodularity. 4. Splenomegaly. 5. Mild prostatomegaly. 6. Aortic atherosclerosis. 7. The patchy opacities in the lingula and left lower lobe shown on the prior exam have resolved. Aortic Atherosclerosis (ICD10-I70.0). Electronically Signed   By: Gaylyn Rong M.D.   On: 07/17/2023 11:31

## 2023-08-07 NOTE — Telephone Encounter (Signed)
Order for [employer] (rollator) placed in [place] portal

## 2023-08-07 NOTE — Assessment & Plan Note (Signed)
Recommend patient continue potassium chloride supplementation.

## 2023-08-07 NOTE — Assessment & Plan Note (Signed)
#  Recurrent lower extremity DVT, secondary to antiphospholipid syndrome.-. Need  life time anticoagulation Currently on coumadin with INR goal of 2-3.   INR pending  INR result will be sent to PCP's office for coumadin dosing.

## 2023-08-07 NOTE — Patient Instructions (Addendum)
Pre visit review using our clinic review tool, if applicable. No additional management support is needed unless otherwise documented below in the visit note.  Continue 1 tablet daily except take 1/2 tablet on Monday, Wednesday and Friday. Recheck on 10/15 at oncology infusion center.

## 2023-08-08 ENCOUNTER — Other Ambulatory Visit: Payer: Self-pay

## 2023-08-08 ENCOUNTER — Encounter: Payer: Self-pay | Admitting: Oncology

## 2023-08-08 ENCOUNTER — Ambulatory Visit
Admission: RE | Admit: 2023-08-08 | Discharge: 2023-08-08 | Disposition: A | Discharge status: Home / Self Care | Payer: BC Managed Care – PPO | Source: Ambulatory Visit | Attending: Radiation Oncology | Admitting: Radiation Oncology

## 2023-08-08 DIAGNOSIS — E876 Hypokalemia: Secondary | ICD-10-CM | POA: Diagnosis not present

## 2023-08-08 DIAGNOSIS — Z452 Encounter for adjustment and management of vascular access device: Secondary | ICD-10-CM | POA: Diagnosis not present

## 2023-08-08 DIAGNOSIS — C7951 Secondary malignant neoplasm of bone: Secondary | ICD-10-CM | POA: Diagnosis not present

## 2023-08-08 DIAGNOSIS — Z510 Encounter for antineoplastic radiation therapy: Secondary | ICD-10-CM | POA: Diagnosis not present

## 2023-08-08 DIAGNOSIS — Z5111 Encounter for antineoplastic chemotherapy: Secondary | ICD-10-CM | POA: Diagnosis not present

## 2023-08-08 DIAGNOSIS — R630 Anorexia: Secondary | ICD-10-CM | POA: Diagnosis not present

## 2023-08-08 DIAGNOSIS — D6861 Antiphospholipid syndrome: Secondary | ICD-10-CM | POA: Diagnosis not present

## 2023-08-08 DIAGNOSIS — Z7901 Long term (current) use of anticoagulants: Secondary | ICD-10-CM | POA: Diagnosis not present

## 2023-08-08 DIAGNOSIS — C221 Intrahepatic bile duct carcinoma: Secondary | ICD-10-CM | POA: Diagnosis not present

## 2023-08-08 LAB — RAD ONC ARIA SESSION SUMMARY
Course Elapsed Days: 0
Plan Fractions Treated to Date: 1
Plan Prescribed Dose Per Fraction: 6 Gy
Plan Total Fractions Prescribed: 5
Plan Total Prescribed Dose: 30 Gy
Reference Point Dosage Given to Date: 6 Gy
Reference Point Session Dosage Given: 6 Gy
Session Number: 1

## 2023-08-09 ENCOUNTER — Other Ambulatory Visit: Payer: Self-pay | Admitting: Oncology

## 2023-08-09 ENCOUNTER — Inpatient Hospital Stay: Payer: BC Managed Care – PPO

## 2023-08-09 ENCOUNTER — Ambulatory Visit
Admission: RE | Admit: 2023-08-09 | Discharge: 2023-08-09 | Disposition: A | Discharge status: Home / Self Care | Payer: BC Managed Care – PPO | Source: Ambulatory Visit | Attending: Radiation Oncology | Admitting: Radiation Oncology

## 2023-08-09 ENCOUNTER — Other Ambulatory Visit: Payer: Self-pay

## 2023-08-09 VITALS — BP 104/53 | HR 66 | Resp 18

## 2023-08-09 DIAGNOSIS — C221 Intrahepatic bile duct carcinoma: Secondary | ICD-10-CM

## 2023-08-09 DIAGNOSIS — Z510 Encounter for antineoplastic radiation therapy: Secondary | ICD-10-CM | POA: Diagnosis not present

## 2023-08-09 DIAGNOSIS — E876 Hypokalemia: Secondary | ICD-10-CM | POA: Diagnosis not present

## 2023-08-09 DIAGNOSIS — C7951 Secondary malignant neoplasm of bone: Secondary | ICD-10-CM | POA: Diagnosis not present

## 2023-08-09 DIAGNOSIS — D6861 Antiphospholipid syndrome: Secondary | ICD-10-CM | POA: Diagnosis not present

## 2023-08-09 DIAGNOSIS — Z7901 Long term (current) use of anticoagulants: Secondary | ICD-10-CM | POA: Diagnosis not present

## 2023-08-09 DIAGNOSIS — Z452 Encounter for adjustment and management of vascular access device: Secondary | ICD-10-CM | POA: Diagnosis not present

## 2023-08-09 DIAGNOSIS — Z5111 Encounter for antineoplastic chemotherapy: Secondary | ICD-10-CM | POA: Diagnosis not present

## 2023-08-09 DIAGNOSIS — R630 Anorexia: Secondary | ICD-10-CM | POA: Diagnosis not present

## 2023-08-09 LAB — RAD ONC ARIA SESSION SUMMARY
Course Elapsed Days: 1
Plan Fractions Treated to Date: 2
Plan Prescribed Dose Per Fraction: 6 Gy
Plan Total Fractions Prescribed: 5
Plan Total Prescribed Dose: 30 Gy
Reference Point Dosage Given to Date: 12 Gy
Reference Point Session Dosage Given: 6 Gy
Session Number: 2

## 2023-08-09 LAB — CEA: CEA: 10.5 ng/mL — ABNORMAL HIGH (ref 0.0–4.7)

## 2023-08-09 LAB — CANCER ANTIGEN 19-9: CA 19-9: 101 U/mL — ABNORMAL HIGH (ref 0–35)

## 2023-08-09 MED ORDER — HEPARIN SOD (PORK) LOCK FLUSH 100 UNIT/ML IV SOLN
500.0000 [IU] | Freq: Once | INTRAVENOUS | Status: AC | PRN
  Administered 2023-08-09: 500 [IU]
  Filled 2023-08-09: qty 5

## 2023-08-09 MED ORDER — SODIUM CHLORIDE 0.9% FLUSH
10.0000 mL | INTRAVENOUS | Status: DC | PRN
  Administered 2023-08-09: 10 mL
  Filled 2023-08-09: qty 10

## 2023-08-11 ENCOUNTER — Other Ambulatory Visit: Payer: Self-pay | Admitting: Family Medicine

## 2023-08-12 ENCOUNTER — Other Ambulatory Visit: Payer: Self-pay

## 2023-08-12 ENCOUNTER — Ambulatory Visit
Admission: RE | Admit: 2023-08-12 | Discharge: 2023-08-12 | Disposition: A | Discharge status: Home / Self Care | Payer: BC Managed Care – PPO | Source: Ambulatory Visit | Attending: Radiation Oncology | Admitting: Radiation Oncology

## 2023-08-12 DIAGNOSIS — R630 Anorexia: Secondary | ICD-10-CM | POA: Diagnosis not present

## 2023-08-12 DIAGNOSIS — C7951 Secondary malignant neoplasm of bone: Secondary | ICD-10-CM | POA: Diagnosis not present

## 2023-08-12 DIAGNOSIS — Z452 Encounter for adjustment and management of vascular access device: Secondary | ICD-10-CM | POA: Diagnosis not present

## 2023-08-12 DIAGNOSIS — E876 Hypokalemia: Secondary | ICD-10-CM | POA: Diagnosis not present

## 2023-08-12 DIAGNOSIS — D6861 Antiphospholipid syndrome: Secondary | ICD-10-CM | POA: Diagnosis not present

## 2023-08-12 DIAGNOSIS — Z7901 Long term (current) use of anticoagulants: Secondary | ICD-10-CM | POA: Diagnosis not present

## 2023-08-12 DIAGNOSIS — C221 Intrahepatic bile duct carcinoma: Secondary | ICD-10-CM | POA: Diagnosis not present

## 2023-08-12 DIAGNOSIS — Z5111 Encounter for antineoplastic chemotherapy: Secondary | ICD-10-CM | POA: Diagnosis not present

## 2023-08-12 DIAGNOSIS — Z510 Encounter for antineoplastic radiation therapy: Secondary | ICD-10-CM | POA: Diagnosis not present

## 2023-08-12 LAB — RAD ONC ARIA SESSION SUMMARY
Course Elapsed Days: 4
Plan Fractions Treated to Date: 3
Plan Prescribed Dose Per Fraction: 6 Gy
Plan Total Fractions Prescribed: 5
Plan Total Prescribed Dose: 30 Gy
Reference Point Dosage Given to Date: 18 Gy
Reference Point Session Dosage Given: 6 Gy
Session Number: 3

## 2023-08-13 ENCOUNTER — Ambulatory Visit: Payer: BC Managed Care – PPO | Admitting: Physical Therapy

## 2023-08-13 ENCOUNTER — Encounter: Payer: Self-pay | Admitting: Physical Therapy

## 2023-08-13 ENCOUNTER — Ambulatory Visit
Admission: RE | Admit: 2023-08-13 | Discharge: 2023-08-13 | Disposition: A | Discharge status: Home / Self Care | Payer: BC Managed Care – PPO | Source: Ambulatory Visit | Attending: Radiation Oncology | Admitting: Radiation Oncology

## 2023-08-13 ENCOUNTER — Other Ambulatory Visit: Payer: Self-pay

## 2023-08-13 DIAGNOSIS — R2689 Other abnormalities of gait and mobility: Secondary | ICD-10-CM

## 2023-08-13 DIAGNOSIS — M6281 Muscle weakness (generalized): Secondary | ICD-10-CM

## 2023-08-13 DIAGNOSIS — E876 Hypokalemia: Secondary | ICD-10-CM | POA: Diagnosis not present

## 2023-08-13 DIAGNOSIS — C7951 Secondary malignant neoplasm of bone: Secondary | ICD-10-CM | POA: Diagnosis not present

## 2023-08-13 DIAGNOSIS — R6889 Other general symptoms and signs: Secondary | ICD-10-CM | POA: Diagnosis not present

## 2023-08-13 DIAGNOSIS — R29898 Other symptoms and signs involving the musculoskeletal system: Secondary | ICD-10-CM | POA: Diagnosis not present

## 2023-08-13 DIAGNOSIS — R296 Repeated falls: Secondary | ICD-10-CM | POA: Diagnosis not present

## 2023-08-13 DIAGNOSIS — Z510 Encounter for antineoplastic radiation therapy: Secondary | ICD-10-CM | POA: Diagnosis not present

## 2023-08-13 DIAGNOSIS — R630 Anorexia: Secondary | ICD-10-CM | POA: Diagnosis not present

## 2023-08-13 DIAGNOSIS — Z5111 Encounter for antineoplastic chemotherapy: Secondary | ICD-10-CM | POA: Diagnosis not present

## 2023-08-13 DIAGNOSIS — Z452 Encounter for adjustment and management of vascular access device: Secondary | ICD-10-CM | POA: Diagnosis not present

## 2023-08-13 DIAGNOSIS — C221 Intrahepatic bile duct carcinoma: Secondary | ICD-10-CM | POA: Diagnosis not present

## 2023-08-13 DIAGNOSIS — D6861 Antiphospholipid syndrome: Secondary | ICD-10-CM | POA: Diagnosis not present

## 2023-08-13 DIAGNOSIS — Z7901 Long term (current) use of anticoagulants: Secondary | ICD-10-CM | POA: Diagnosis not present

## 2023-08-13 LAB — RAD ONC ARIA SESSION SUMMARY
Course Elapsed Days: 5
Plan Fractions Treated to Date: 4
Plan Prescribed Dose Per Fraction: 6 Gy
Plan Total Fractions Prescribed: 5
Plan Total Prescribed Dose: 30 Gy
Reference Point Dosage Given to Date: 24 Gy
Reference Point Session Dosage Given: 6 Gy
Session Number: 4

## 2023-08-13 NOTE — Therapy (Unsigned)
OUTPATIENT PHYSICAL THERAPY LOWER EXTREMITY TREATMENT   Patient Name: Christopher Marsh MRN: [id number] DOB:10-11-1961, 62 y.o., male Today's Date: 08/13/2023  END OF SESSION:  PT End of Session - 08/13/23 0810     Visit Number 2    Number of Visits 16    Date for PT Re-Evaluation 09/30/23    PT Start Time 0813    PT Stop Time 0902    PT Time Calculation (min) 49 min    Equipment Utilized During Treatment Gait belt    Activity Tolerance Patient tolerated treatment well    Behavior During Therapy WFL for tasks assessed/performed              Past Medical History:  Diagnosis Date   Antiphospholipid antibody syndrome (HCC)    Arthritis    Cancer (HCC)    Diabetes mellitus without complication (HCC)    DVT (deep venous thrombosis) (HCC)    Lt leg   Dyspnea    Elevated lipids    Erythrocytosis    Hyperlipidemia    Hypertension    Lower extremity edema    Multinodular thyroid    Post-thrombotic syndrome    Past Surgical History:  Procedure Laterality Date   CARDIOVERSION N/A 03/22/2023   Procedure: CARDIOVERSION;  Surgeon: Antonieta Iba, MD;  Location: [facility] ORS;  Service: Cardiovascular;  Laterality: N/A;   COLONOSCOPY WITH PROPOFOL N/A 09/23/2018   Per Dr. Norma Fredrickson, polyp, repeat in 3 yrs    EYE MUSCLE SURGERY     JOINT REPLACEMENT Left 07/22/2017   PORTA CATH INSERTION N/A 12/24/2022   Procedure: PORTA CATH INSERTION;  Surgeon: Annice Needy, MD;  Location: [facility];  Service: Cardiovascular;  Laterality: N/A;   TOTAL HIP ARTHROPLASTY Left 07/22/2017   Procedure: TOTAL HIP ARTHROPLASTY;  Surgeon: Donato Heinz, MD;  Location: [facility] OR[facility];  Service: Orthopedics;  Laterality: Left;   Patient Active Problem List   Diagnosis Date Noted   Frequent falls 06/17/2023   Right knee pain 06/03/2023   Radiation dermatitis 05/20/2023   Weight loss 05/20/2023   Persistent atrial fibrillation (HCC) 04/11/2023   History of DVT (deep vein thrombosis) 04/11/2023    Chemotherapy induced diarrhea 03/27/2023   Constipation 03/12/2023   Hyperphosphatemia 03/12/2023   Atrial fibrillation (HCC) 02/20/2023   Pleural effusion 02/14/2023   Acute diastolic CHF (congestive heart failure) (HCC) 02/14/2023   Tachycardia 02/13/2023   Typical atrial flutter (HCC) 02/13/2023   DVT (deep venous thrombosis) (HCC) 02/13/2023   Hypokalemia 02/13/2023   Obesity (BMI 30-39.9) 02/13/2023   Diabetes mellitus without complication (HCC) 02/13/2023   Macrocytic anemia 02/13/2023   SOB (shortness of breath) 02/13/2023   Antineoplastic chemotherapy induced anemia 01/23/2023   Extravasation accident 01/02/2023   Hypomagnesemia 01/02/2023   Metastasis to bone (HCC) 12/12/2022   Sore throat 11/21/2022   Rhinitis 11/21/2022   Hyperkalemia 11/21/2022   Encounter for antineoplastic chemotherapy 10/10/2022   Lung infiltrate on CT 10/10/2022   Hypocalcemia 10/10/2022   Goals of care, counseling/discussion 08/30/2022   Antiphospholipid syndrome (HCC) 08/30/2022   Cholangiocarcinoma (HCC) 08/27/2022   Multifocal pneumonia 08/19/2022   Hepatic lesion 08/19/2022   Snoring 08/19/2022   Primary insomnia 08/19/2022   Liver mass, right lobe 08/19/2022   Pneumonia 08/18/2022   Lymphedema 04/20/2022   Erythrocytosis 12/18/2021   COVID-19 virus infection 09/01/2020   Status post total replacement of hip 07/22/2017   Essential hypertension 08/28/2016   Actinic keratosis 11/30/2015   DM2 (diabetes mellitus, type 2) (HCC)  02/15/2015   Morbid obesity (HCC) 06/18/2013   Left hip pain 04/02/2012   HSV (herpes simplex virus) infection 04/02/2012   CONTACT DERMATITIS&OTHER ECZEMA DUE UNSPEC CAUSE 09/14/2009   ERECTILE DYSFUNCTION 12/08/2008   Venous (peripheral) insufficiency 12/08/2008   Hyperlipemia 11/24/2008   ESOPHAGITIS, REFLUX 11/18/2008    PCP: Nelwyn Salisbury, MD  REFERRING PROVIDER: [name], MD  REFERRING DIAG: Cholangiocarcinoma, frequent falls, weakness of both lower  extremities  THERAPY DIAG:  Muscle weakness (generalized)  Decreased activity tolerance  Balance problem  Rationale for Evaluation and Treatment: Rehabilitation  ONSET DATE: April-May 2024  SUBJECTIVE:   SUBJECTIVE STATEMENT: Pt reports to PT with chief concern of bilateral weakness in his legs and frequent falls over the course of the last several months. Pt reports he has cancer which has metastasized to his bones in both hips, resulting in weakness that he has been dealing with since his diagnosis. Pt reports he has had 3 falls in the last 6 months, all of which due to his leg weakness. The most recent fall was approximately 5-6 weeks ago when he fell off of his BSC at home due to his legs "giving out" when going to stand up. Pt also reports falls when navigating stairs at home. Pt uses a RW at home 24/7 and [facility] when out in the community. Pt denies any dizziness/lightheadedness associated with his falls.  PERTINENT HISTORY: A-fib, HTN, cholengiocarcinoma, DM2, Hx of DVT bilaterally, L THA (~2018) PAIN:  Are you having pain? Yes: NPRS scale: 2/10 (now), 8/10 (worst) Pain location: Bilateral hips Pain description: Sharp, achy Aggravating factors: Prolonged standing/walking Relieving factors: Fentanyl patch (R leg/hip), Oxycodone PRN  PRECAUTIONS: Fall  RED FLAGS: None   WEIGHT BEARING RESTRICTIONS: No  FALLS:  Has patient fallen in last 6 months? Yes. Number of falls 3  LIVING ENVIRONMENT: Lives with: lives with their spouse Lives in: House/apartment Stairs: Yes: Internal: 14 steps; on right going up and External: 7 steps; can reach both Has following equipment at home: Single point cane and Walker - 2 wheeled  PLOF: Independent with household mobility with device and Independent with community mobility with device  PATIENT GOALS: Walk without cane, increase LE strength  NEXT MD VISIT: 08/07/2023 (chemo)  OBJECTIVE:  Note: Objective measures were completed at  Evaluation unless otherwise noted.  DIAGNOSTIC FINDINGS: N/A  PATIENT SURVEYS:  FOTO 48/55  COGNITION: Overall cognitive status: Within functional limits for tasks assessed     SENSATION: WFL (neuropathy in bilateral feet)   POSTURE: rounded shoulders   LOWER EXTREMITY ROM:  Active ROM Right eval Left eval  Hip flexion 25% limited compared to L side WFL  Hip extension [facility]  Hip abduction [facility]  Hip adduction    Hip internal rotation [facility]  Hip external rotation [facility]  Knee flexion [facility] WFL  Knee extension [facility]  Ankle dorsiflexion [facility] WFL  Ankle plantarflexion    Ankle inversion    Ankle eversion     (Blank rows = not tested)  LOWER EXTREMITY MMT:  MMT Right eval Left eval  Hip flexion 3- 3+  Hip extension    Hip abduction 4- 4-  Hip adduction    Hip internal rotation 3+ 3+  Hip external rotation 4- 4+  Knee flexion 4- 4-  Knee extension 3+ 4+  Ankle dorsiflexion 4 4+  Ankle plantarflexion    Ankle inversion    Ankle eversion     (Blank rows = not tested)  FUNCTIONAL TESTS:  5 times sit to stand: 24.26 seconds (with use of UE's on armrests) Berg Balance Scale: 42/56 (pt particularly struggled with SLS, alternating step taps, attaining tandem stance)  GAIT: Distance walked: 20 feet in clinic Assistive device utilized: Single point cane Level of assistance: Complete Independence Comments: Slow-cautious gait pattern with recip. Gait/ heel strike noted.    TODAY'S TREATMENT:                                                                                                                              DATE: 08/05/2023   Subjective: Pt reports he is feeling fine this morning; he got chemo last week so he reports not feeling as well at home, as a result he only did his HEP a few times. Pt reports R thigh pain (4/10); he says he tries not to take his Oxycodone too much. Pt has radiation later today.     Therex: Vitals pre-6MWT: 99% O2, HR 99  bpm, BP: 144/62 in sitting  Foot sensation assessed at start of session: [facility] bilaterally  : 475 feet with SPC, 1 seated rest break in between (30 seconds) - 100% O2, HR 126 bpm (decreased to 100 bpm after 30 seconds seated rest)  TUG: 23.77 seconds with SPC  [place]: 2x10  Supine SLR: 1x15 each side - increased difficulty with RLE  Sitting LAQ: 1x15 bodyweight, 1x15 with 3# AW on both LE's  135/56 sitting BP at end of session  PATIENT EDUCATION:  Education details: HEP form and frequency, use of RW 24/7 secondary to BERG score (increased fall risk), role of PT going forward Person educated: Patient Education method: Explanation, Demonstration, and Handouts Education comprehension: returned demonstration  HOME EXERCISE PROGRAM: Access Code: [email or web address] URL: [email or web address] Date: 08/05/2023 Prepared by: Dorene Grebe Exercises - Narrow Stance with Counter Support - 2 x daily - 7 x weekly - 3 sets - 2 reps - 30 seconds hold - Standing Tandem Balance with Counter Support - 2 x daily - 7 x weekly - 3 sets - 2 reps - 30 second hold - Sit to Stand with Counter Support - 2 x daily - 4-5 x weekly - 3 sets - 10 reps - Seated March with Resistance - 2 x daily - 4-5 x weekly - 3 sets - 10 reps - Seated Hip Abduction with Resistance - 2 x daily - 4-5 x weekly - 3 sets - 10 reps   ASSESSMENT:  CLINICAL IMPRESSION: Pt reports to PT with mild R thigh pain, he reports not taking his oxycodone recently which is why it is bothering him. and TUG performed at start of session; pt demonstrates decreased cardiovascular and LE endurance based on distance; pt is also at increased risk for falls based on TUG time. Pt reports he ordered a RW that is supposed to arrive soon. The rest of the session consisted of exercises aimed to improve pt's LE strength. Exercises today performed  in sitting/supine secondary to pt experiencing fatigue from and TUG tests. Pt continues to  demonstrate greater weakness in his RLE compared to the LLE. Pt will benefit from skilled PT services going forward to address the above impairments and return patient to PLOF and improved function.   OBJECTIVE IMPAIRMENTS: decreased activity tolerance, decreased balance, decreased endurance, decreased strength, increased edema, impaired sensation, and pain.   ACTIVITY LIMITATIONS: standing, stairs, transfers, and locomotion level  PARTICIPATION LIMITATIONS: community activity, yard work, and Multimedia programmer  PERSONAL FACTORS: Age, Time since onset of injury/illness/exacerbation, and 3+ comorbidities: Hx of DVT's, cholangiocarcinoma, L THA, a-fib  are also affecting patient's functional outcome.   REHAB POTENTIAL: Good  CLINICAL DECISION MAKING: Stable/uncomplicated  EVALUATION COMPLEXITY: Moderate   GOALS:  SHORT TERM GOALS: Target date: 09/02/2023 Pt will be independent and compliant with HEP in order to improve SLS to at least 5 seconds bilaterally and show improvement in unilateral stability. Baseline: Pt able to hold 2-3 seconds bilaterally during BERG Balance Test Goal status: INITIAL    LONG TERM GOALS: Target date: 09/30/2023  Pt will improve FOTO score to at least 55 in order to demonstrate improvement in pain and function with ADL's/recreational activities. Baseline: 48 Goal status: INITIAL  2.  Pt will increase LE MMT scores to at least 4/5 bilaterally in order to show improvement in LE strength needed for return to playing golf. Baseline: See above for initial LE MMT scores Goal status: INITIAL  3.  Pt will decrease 5TSTS by at least 3 seconds in order to demonstrate clinically significant improvement in LE strength  Baseline: 24.26 seconds (with use of UEs on armrests) Goal status: INITIAL  4.  Pt will improve BERG by at least 3 points in order to demonstrate clinically significant improvement in balance and decrease risk for falls. Baseline: 42/56 Goal status:  INITIAL  5.  Pt will be able to ambulate at least 500 feet during with LRAD in order to demonstrate improvement in cardiovascular and LE endurance. Baseline: To be assessed next session Goal status: INITIAL   PLAN:  PT FREQUENCY: 2x/week  PT DURATION: 8 weeks  PLANNED INTERVENTIONS: Therapeutic exercises, Therapeutic activity, Neuromuscular re-education, Balance training, Patient/Family education, Joint mobilization, Cryotherapy, Moist heat, Manual therapy, and Re-evaluation  PLAN FOR NEXT SESSION: Introduce standing LE strengthening/endurance exercises, static/dynamic balance exercises  Stepheni Cameron, SPT 08/13/23, 10:06 AM

## 2023-08-14 ENCOUNTER — Ambulatory Visit: Payer: BC Managed Care – PPO

## 2023-08-14 ENCOUNTER — Other Ambulatory Visit: Payer: BC Managed Care – PPO

## 2023-08-14 ENCOUNTER — Other Ambulatory Visit: Payer: Self-pay

## 2023-08-14 ENCOUNTER — Ambulatory Visit
Admission: RE | Admit: 2023-08-14 | Discharge: 2023-08-14 | Disposition: A | Discharge status: Home / Self Care | Payer: BC Managed Care – PPO | Source: Ambulatory Visit | Attending: Radiation Oncology | Admitting: Radiation Oncology

## 2023-08-14 ENCOUNTER — Ambulatory Visit: Payer: BC Managed Care – PPO | Admitting: Oncology

## 2023-08-14 DIAGNOSIS — Z452 Encounter for adjustment and management of vascular access device: Secondary | ICD-10-CM | POA: Diagnosis not present

## 2023-08-14 DIAGNOSIS — D6861 Antiphospholipid syndrome: Secondary | ICD-10-CM | POA: Diagnosis not present

## 2023-08-14 DIAGNOSIS — C7951 Secondary malignant neoplasm of bone: Secondary | ICD-10-CM | POA: Diagnosis not present

## 2023-08-14 DIAGNOSIS — C221 Intrahepatic bile duct carcinoma: Secondary | ICD-10-CM | POA: Diagnosis not present

## 2023-08-14 DIAGNOSIS — R630 Anorexia: Secondary | ICD-10-CM | POA: Diagnosis not present

## 2023-08-14 DIAGNOSIS — E876 Hypokalemia: Secondary | ICD-10-CM | POA: Diagnosis not present

## 2023-08-14 DIAGNOSIS — Z5111 Encounter for antineoplastic chemotherapy: Secondary | ICD-10-CM | POA: Diagnosis not present

## 2023-08-14 DIAGNOSIS — Z510 Encounter for antineoplastic radiation therapy: Secondary | ICD-10-CM | POA: Diagnosis not present

## 2023-08-14 DIAGNOSIS — Z7901 Long term (current) use of anticoagulants: Secondary | ICD-10-CM | POA: Diagnosis not present

## 2023-08-14 LAB — RAD ONC ARIA SESSION SUMMARY
Course Elapsed Days: 6
Plan Fractions Treated to Date: 5
Plan Prescribed Dose Per Fraction: 6 Gy
Plan Total Fractions Prescribed: 5
Plan Total Prescribed Dose: 30 Gy
Reference Point Dosage Given to Date: 30 Gy
Reference Point Session Dosage Given: 6 Gy
Session Number: 5

## 2023-08-15 ENCOUNTER — Inpatient Hospital Stay (HOSPITAL_BASED_OUTPATIENT_CLINIC_OR_DEPARTMENT_OTHER): Payer: BC Managed Care – PPO | Admitting: Hospice and Palliative Medicine

## 2023-08-15 DIAGNOSIS — C7951 Secondary malignant neoplasm of bone: Secondary | ICD-10-CM

## 2023-08-15 DIAGNOSIS — C221 Intrahepatic bile duct carcinoma: Secondary | ICD-10-CM | POA: Diagnosis not present

## 2023-08-15 NOTE — Progress Notes (Signed)
Virtual Visit via Telephone Note  I connected with Christopher Marsh on 08/15/23 at  1:30 PM EDT by telephone and verified that I am speaking with the correct person using two identifiers.  Location: Patient: Home Provider: Clinic   I discussed the limitations, risks, security and privacy concerns of performing an evaluation and management service by telephone and the availability of in person appointments. I also discussed with the patient that there may be a patient responsible charge related to this service. The patient expressed understanding and agreed to proceed.   History of Present Illness: Christopher Marsh is a 62 y.o. male with multiple medical problems including stage IV cholangiocarcinoma.  Patient has had disease progression on multiple previous lines of treatment.  He is referred to palliative care to address goals and manage ongoing symptoms.    Observations/Objective: I called and spoke with patient by phone.  He denies any acute symptomatic complaints or concerns.  He reports feeling overall "tired" of the multiple appointments and treatments.  However, he says that he remains committed to continuing treatment until time of next imaging to see if there has been improvement.  If not, he says that he would be open to considering alternatives.  Patient says that he would be interested in completing MOST form in the future.  Will discuss with him at time of next visit.  Assessment and Plan: Stage IV cholangiocarcinoma -on treatment with third line FOLFOX  Bone metastasis -status post XRT.  Denies significant pain.   Follow Up Instructions: RTC 1 month.  Will plan to address MOST form at time of next visit.    I discussed the assessment and treatment plan with the patient. The patient was provided an opportunity to ask questions and all were answered. The patient agreed with the plan and demonstrated an understanding of the instructions.   The patient was advised to call  back or seek an in-person evaluation if the symptoms worsen or if the condition fails to improve as anticipated.  I provided 5 minutes of non-face-to-face time during this encounter.   Malachy Moan, NP

## 2023-08-16 ENCOUNTER — Telehealth: Payer: Self-pay

## 2023-08-16 ENCOUNTER — Ambulatory Visit: Payer: BC Managed Care – PPO | Admitting: Physical Therapy

## 2023-08-16 NOTE — Telephone Encounter (Signed)
Pt has been having lab draw INR when he has an oncology apt. Pt was to have last INR draw on 10/15 this week but no labs were drawn. Next INR could be drawn on 10/28, his next oncology apt. Will check for INR result for that date.

## 2023-08-17 ENCOUNTER — Other Ambulatory Visit: Payer: Self-pay | Admitting: Family Medicine

## 2023-08-17 DIAGNOSIS — E7800 Pure hypercholesterolemia, unspecified: Secondary | ICD-10-CM

## 2023-08-18 ENCOUNTER — Encounter: Payer: Self-pay | Admitting: Oncology

## 2023-08-19 ENCOUNTER — Encounter: Payer: Self-pay | Admitting: Physical Therapy

## 2023-08-19 ENCOUNTER — Ambulatory Visit: Payer: BC Managed Care – PPO | Admitting: Physical Therapy

## 2023-08-19 DIAGNOSIS — M6281 Muscle weakness (generalized): Secondary | ICD-10-CM | POA: Diagnosis not present

## 2023-08-19 DIAGNOSIS — R6889 Other general symptoms and signs: Secondary | ICD-10-CM | POA: Diagnosis not present

## 2023-08-19 DIAGNOSIS — R296 Repeated falls: Secondary | ICD-10-CM | POA: Diagnosis not present

## 2023-08-19 DIAGNOSIS — R2689 Other abnormalities of gait and mobility: Secondary | ICD-10-CM

## 2023-08-19 DIAGNOSIS — R29898 Other symptoms and signs involving the musculoskeletal system: Secondary | ICD-10-CM | POA: Diagnosis not present

## 2023-08-19 DIAGNOSIS — C221 Intrahepatic bile duct carcinoma: Secondary | ICD-10-CM | POA: Diagnosis not present
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
## 2023-08-20 ENCOUNTER — Encounter: Payer: Self-pay | Admitting: Oncology

## 2023-08-21 ENCOUNTER — Inpatient Hospital Stay: Payer: BC Managed Care – PPO

## 2023-08-21 ENCOUNTER — Other Ambulatory Visit: Payer: BC Managed Care – PPO

## 2023-08-21 ENCOUNTER — Ambulatory Visit: Payer: BC Managed Care – PPO

## 2023-08-21 ENCOUNTER — Ambulatory Visit: Payer: BC Managed Care – PPO | Admitting: Oncology

## 2023-08-21 NOTE — Progress Notes (Signed)
Nutrition  Called patient for nutrition phone visit. No answer.  Left message with call back number.  Emalyn Schou B. Freida Busman, RD, LDN Registered Dietitian [phone]

## 2023-08-22 ENCOUNTER — Ambulatory Visit: Payer: BC Managed Care – PPO | Admitting: Physical Therapy

## 2023-08-26 ENCOUNTER — Encounter: Payer: Self-pay | Admitting: Oncology

## 2023-08-26 ENCOUNTER — Inpatient Hospital Stay: Payer: BC Managed Care – PPO

## 2023-08-26 ENCOUNTER — Inpatient Hospital Stay (HOSPITAL_BASED_OUTPATIENT_CLINIC_OR_DEPARTMENT_OTHER): Payer: BC Managed Care – PPO | Admitting: Oncology

## 2023-08-26 ENCOUNTER — Ambulatory Visit (INDEPENDENT_AMBULATORY_CARE_PROVIDER_SITE_OTHER): Payer: BC Managed Care – PPO

## 2023-08-26 ENCOUNTER — Encounter: Payer: BC Managed Care – PPO | Admitting: Physical Therapy

## 2023-08-26 ENCOUNTER — Telehealth: Payer: Self-pay | Admitting: *Deleted

## 2023-08-26 VITALS — BP 103/52 | HR 67 | Temp 98.4°F | Resp 17 | Wt 230.0 lb

## 2023-08-26 DIAGNOSIS — R634 Abnormal weight loss: Secondary | ICD-10-CM

## 2023-08-26 DIAGNOSIS — C221 Intrahepatic bile duct carcinoma: Secondary | ICD-10-CM

## 2023-08-26 DIAGNOSIS — R630 Anorexia: Secondary | ICD-10-CM | POA: Diagnosis not present

## 2023-08-26 DIAGNOSIS — Z7901 Long term (current) use of anticoagulants: Secondary | ICD-10-CM

## 2023-08-26 DIAGNOSIS — Z5111 Encounter for antineoplastic chemotherapy: Secondary | ICD-10-CM | POA: Diagnosis not present

## 2023-08-26 DIAGNOSIS — C7951 Secondary malignant neoplasm of bone: Secondary | ICD-10-CM | POA: Diagnosis not present

## 2023-08-26 DIAGNOSIS — Z452 Encounter for adjustment and management of vascular access device: Secondary | ICD-10-CM | POA: Diagnosis not present

## 2023-08-26 DIAGNOSIS — D6861 Antiphospholipid syndrome: Secondary | ICD-10-CM | POA: Diagnosis not present

## 2023-08-26 DIAGNOSIS — Z510 Encounter for antineoplastic radiation therapy: Secondary | ICD-10-CM | POA: Diagnosis not present

## 2023-08-26 DIAGNOSIS — E876 Hypokalemia: Secondary | ICD-10-CM | POA: Diagnosis not present

## 2023-08-26 LAB — CBC WITH DIFFERENTIAL (CANCER CENTER ONLY)
Abs Immature Granulocytes: 0.02 10*3/uL (ref 0.00–0.07)
Basophils Absolute: 0 10*3/uL (ref 0.0–0.1)
Basophils Relative: 1 %
Eosinophils Absolute: 0 10*3/uL (ref 0.0–0.5)
Eosinophils Relative: 0 %
HCT: 29.9 % — ABNORMAL LOW (ref 39.0–52.0)
Hemoglobin: 9.6 g/dL — ABNORMAL LOW (ref 13.0–17.0)
Immature Granulocytes: 1 %
Lymphocytes Relative: 2 %
Lymphs Abs: 0.1 10*3/uL — ABNORMAL LOW (ref 0.7–4.0)
MCH: 30.8 pg (ref 26.0–34.0)
MCHC: 32.1 g/dL (ref 30.0–36.0)
MCV: 95.8 fL (ref 80.0–100.0)
Monocytes Absolute: 0.9 10*3/uL (ref 0.1–1.0)
Monocytes Relative: 25 %
Neutro Abs: 2.7 10*3/uL (ref 1.7–7.7)
Neutrophils Relative %: 71 %
Platelet Count: 92 10*3/uL — ABNORMAL LOW (ref 150–400)
RBC: 3.12 MIL/uL — ABNORMAL LOW (ref 4.22–5.81)
RDW: 15.1 % (ref 11.5–15.5)
WBC Count: 3.7 10*3/uL — ABNORMAL LOW (ref 4.0–10.5)
nRBC: 0 % (ref 0.0–0.2)

## 2023-08-26 LAB — CMP (CANCER CENTER ONLY)
ALT: 16 U/L (ref 0–44)
AST: 23 U/L (ref 15–41)
Albumin: 2.6 g/dL — ABNORMAL LOW (ref 3.5–5.0)
Alkaline Phosphatase: 108 U/L (ref 38–126)
Anion gap: 6 (ref 5–15)
BUN: 10 mg/dL (ref 8–23)
CO2: 25 mmol/L (ref 22–32)
Calcium: 8 mg/dL — ABNORMAL LOW (ref 8.9–10.3)
Chloride: 103 mmol/L (ref 98–111)
Creatinine: 0.74 mg/dL (ref 0.61–1.24)
GFR, Estimated: >60 mL/min (ref >60)
Glucose, Bld: 144 mg/dL — ABNORMAL HIGH (ref 70–99)
Potassium: 3.4 mmol/L — ABNORMAL LOW (ref 3.5–5.1)
Sodium: 134 mmol/L — ABNORMAL LOW (ref 135–145)
Total Bilirubin: 1.3 mg/dL — ABNORMAL HIGH (ref 0.3–1.2)
Total Protein: 5.5 g/dL — ABNORMAL LOW (ref 6.5–8.1)

## 2023-08-26 LAB — PROTIME-INR
INR: 4.7 (ref 0.8–1.2)
Prothrombin Time: 44.3 s — ABNORMAL HIGH (ref 11.4–15.2)

## 2023-08-26 MED ORDER — HEPARIN SOD (PORK) LOCK FLUSH 100 UNIT/ML IV SOLN
500.0000 [IU] | Freq: Once | INTRAVENOUS | Status: AC
  Administered 2023-08-26: 500 [IU] via INTRAVENOUS
  Filled 2023-08-26: qty 5

## 2023-08-26 MED ORDER — MEGESTROL ACETATE 40 MG PO TABS: 80.0000 mg | ORAL_TABLET | Freq: Two times a day (BID) | ORAL | 0 refills | Status: DC

## 2023-08-26 NOTE — Assessment & Plan Note (Addendum)
S/p palliative  radiation  Consider bisphosphonate after dental clearance is obtained. S/p left ischium.  radiation due to concern of pathological fracture.  Right thigh pain, no RT per Radonc. Felt was due to arthritis.  CT showed worse of bone metastatic disease, T12 epidural tumor extension- MRI thoracic and lumbar spine showed T12 epidural paraspinal tumor extension.  S/p  radiation of  T12

## 2023-08-26 NOTE — Progress Notes (Unsigned)
Per MD no treatment today, port deacccessed. Patient discharged. No concerns voiced.

## 2023-08-26 NOTE — Telephone Encounter (Signed)
Christopher Duty, RN contacted [name], RN who manages her INR levels. She will contact patient re changes

## 2023-08-26 NOTE — Progress Notes (Signed)
Patient here for oncology follow-up appointment, expresses no complaints or concerns at this time.    

## 2023-08-26 NOTE — Telephone Encounter (Signed)
Called report PT 44.3, INR 4.7

## 2023-08-26 NOTE — Assessment & Plan Note (Addendum)
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 [facility] 2nd opinion was reviewed.  Recommend to proceed with 3rd line treatment with FOLFOX, CT showed mixed response after 3 cycles, he tolerated with moderate/severe difficulties.  Labs are reviewed and discussed with patient. Hold chemotherapy due to weight loss and fatigue.  Re-evaluate in 2 weeks. Discussed about option of hospice/comfort care. Patient would like to take a break and see how he feels and decide.

## 2023-08-26 NOTE — Progress Notes (Signed)
Hematology/Oncology Progress note [phone] [phone]    CHIEF COMPLAINTS/REASON FOR VISIT:  Follow up for right lower extremity DVT, antiphospholipid syndrome, metastatic cholangiocarcinoma.  ASSESSMENT & PLAN:   Cancer Staging  Cholangiocarcinoma [facility]) Staging form: Intrahepatic Bile Duct, AJCC 8th Edition - Clinical stage from 08/27/2022: Stage IV (cT2, cN1, cM1) - Signed by [name], MD on 09/13/2022   Cholangiocarcinoma (HCC) 6Cholangiocarcinoma, NGS-Tempus liquid biopsy FGFR2 -ADK mutation, TMB 0,  S/p Gemcitabine, Cisplatin and Durvalumab x 6 , 01/2023 CT progression/enlarged liver lesions --03/03/23 2nd line Futibatinib- 04/26/23 CT showed interval progression  Recommend patient to stop Futibatinib. [facility] 2nd opinion was reviewed.  Recommend to proceed with 3rd line treatment with FOLFOX, CT showed mixed response after 3 cycles, he tolerated with moderate/severe difficulties.  Labs are reviewed and discussed with patient. Hold chemotherapy due to weight loss and fatigue.  Re-evaluate in 2 weeks. Discussed about option of hospice/comfort care. Patient would like to take a break and see how he feels and decide.    Metastasis to bone [facility]) S/p palliative  radiation  Consider bisphosphonate after dental clearance is obtained. S/p left ischium.  radiation due to concern of pathological fracture.  Right thigh pain, no RT per Radonc. Felt was due to arthritis.  CT showed worse of bone metastatic disease, T12 epidural tumor extension- MRI thoracic and lumbar spine showed T12 epidural paraspinal tumor extension.  S/p  radiation of  T12   Antiphospholipid syndrome (HCC) #Recurrent lower extremity DVT, secondary to antiphospholipid syndrome.-. Need  life time anticoagulation Currently on coumadin with INR goal of 2-3.   INR pending  INR result will be sent to PCP's office for coumadin dosing.    Hypocalcemia Continue calcium supplementation,  currently on 1000 mg twice daily.   Recommend Vitamin D 1000 units daily.   Hypokalemia Recommend patient continue potassium chloride supplementation.   Weight loss Due to poor oral intake secondary to decreased appetite.   Continue nutritional supplementation. Follow with nutritionist.  Recommend megace 80mg  BID. Rationale and side effects were reviewed with patient.     Orders Placed This Encounter  Procedures   Protime-INR    Standing Status:   Future    Standing Expiration Date:   09/08/2024   Cancer antigen 19-9    Standing Status:   Future    Standing Expiration Date:   09/08/2024   CEA    Standing Status:   Future    Standing Expiration Date:   09/08/2024   CBC with Differential (Cancer Center Only)    Standing Status:   Future    Standing Expiration Date:   09/08/2024   CMP (Cancer Center only)    Standing Status:   Future    Standing Expiration Date:   09/08/2024   Follow up 2 weeks lab MD chemo  All questions were answered. The patient knows to call the clinic with any problems, questions or concerns.  [name], MD, PhD [facility] Hematology Oncology 08/26/2023    HISTORY OF PRESENTING ILLNESS:  Patient reports remote history of left lower extremity DVT in 2002.  He was initiated on Lovenox and bridged to Coumadin.  Patient took warfarin for 2 years before anticoagulation was stopped. 12/03/2021, patient presented to emergency room for evaluation of right lower extremity pain and swelling for about a week.  Started on the inner side of right thigh and migrated to the right calf. + Associated with swelling.  Denies any recent injury, hospitalization, surgery.  He first noticed  the symptoms after playing basketball with his grandson.   12/03/2021, right lower extremity ultrasound showed occlusive DVT extending from the mid aspect of the femoral vein through the imaged tibial vein.  Age-indeterminate. Patient was started on Xarelto. He was referred to establish care  with vascular surgeon and was seen by Sheppard Plumber on 12/06/2021.  Shared decision was made not to proceed with embolectomy.  Continue anticoagulation. Patient was referred to hematology oncology for further evaluation.  Patient denies any family history of blood clots.  Denies any unintentional weight loss, fever or night sweats. 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  He sometimes stay in his car while waiting for next assessment appointment.  He reports the right lower extremity symptom has improved since the start of Xarelto.  No active bleeding events.  #12/18/2021 hypercoagulable work-up showed JAK2 V617F mutation negative, with reflex to other mutations CALR, MPL, JAK 2 Ex 12-15 mutations negative, negative anticardiolipin IgG and IgM antibodies, positive lupus anticoagulant, negative factor V Leiden mutation, negative prothrombin gene mutation, normal protein C activity, normal protein S antigen level. Patient was recommended to switch to Coumadin with INR goal of 2-3.  #03/15/2022, repeat lupus anticoagulant is persistently positive- + antiphospholipid syndrome.  Patient is currently on Lovenox 1 mg/kg twice daily.  admitted due to multifocal pneumonia, treated with Influenza panel negative, COVID-negative, HIV negative, Complete course of antibiotics with Zithromax, Vantin   + HCV antibody positive, HCV RNA quantification not detected. HCV RT PCR not detected.   INTERVAL HISTORY BONNER JANI is a 62 y.o. male who has above history reviewed by me today presents for follow up visit for management of right lower extremity DVT and antiphospholipid syndrome, metastatic cholangiocarcinoma.  Oncology History  Cholangiocarcinoma (HCC)  10/30/2021 Imaging   CT chest wo contrast 1. Heterogeneous bilateral ground-glass disease, multilobar but worst in the right middle lobe, lingula and lower lobes.  There is progression of the abnormality since 10/12/2022. Though pneumonia could produce this appearance, given persistence and fluctuating appearance since October, consider alternative entities such as hypersensitivity pneumonitis or idiopathic interstitial pneumonia. 2. Slightly enlarged pulmonary trunk, possible arterial hypertension 3. Redemonstrated large hypodense right hepatic lobe mass.   08/18/2022 Imaging   CT chest w contrast showed 1. Multifocal airspace disease in both lungs, left side greater than right. Findings are most compatible with multifocal pneumonia. 2. Multiple new hepatic lesions with enlarging lymph nodes in the upper abdomen and chest. Findings are concerning for metastatic disease. Based on the multifocal pneumonia, hepatic abscesses would also be in the differential diagnosis. Recommend further characterization of the abdomen and pelvis with CT with IV contrast. 3. Enlargement of the main pulmonary artery could be associated with pulmonary hypertension. 4. Coronary artery calcifications. 5. Multinodular goiter. Patient has known thyroid nodules and previous thyroid ultrasound.    08/18/2022 Imaging   CT abdomen pelvis w contrast  Interval development of 6.5 x 5.2 cm heterogeneously enhancing mass in right hepatic lobe. This is highly concerning for neoplasm or malignancy, and further evaluation with MRI is recommended.   Mildly enlarged periaortic adenopathy is noted, with the largest lymph node measuring 12 mm. Metastatic disease cannot be excluded. Mildly enlarged adenopathy is also noted in the gastrohepatic ligament, but this is unchanged compared to prior exam.   Stable bibasilar lung opacities are noted concerning for inflammation, atelectasis or possibly scarring.   Grossly stable multi-septated cystic lesion is  seen involving upper pole of right kidney with peripheral calcifications compared to prior exam of 2019, most likely representing benign etiology.    Aortic Atherosclerosis    08/18/2022 - 08/21/2022 Hospital Admission   Admitted due to multifocal pneumonia, treated with Influenza panel negative, COVID-negative, HIV negative, Complete course of antibiotics with Zithromax, Vantin     08/19/2022 Imaging   MR abdomen w wo contrast  Marked caudate lobe hypertrophy, highly suspicious for hepatic cirrhosis.   Numerous hypervascular masses throughout the right hepatic lobe, highly suspicious for multifocal hepatocellular carcinoma. Recommend correlation with AFP and consider tissue sampling.   Mild abdominal lymphadenopathy, with differential diagnosis including metastatic disease and reactive lymphadenopathy in setting of cirrhosis.   Bilateral lower lobe infiltrates, as better demonstrated on recent CT.    08/19/2022 Tumor Marker   AFP 4.1   08/27/2022 Initial Diagnosis   Cholangiocarcinoma  -08/20/22 Liver mass biopsy showed poorly differentiated carcinoma Immunohistochemical stains show that the poorly differentiated carcinoma is positive for CK7 and MOC-31, suggestive of a poorly differentiated adenocarcinoma.  Immunohistochemical stains for CK20, CDX2, HepPar 1 and arginase are negative.  Immunostain for glypican-3 shows very focal  likely nonspecific labeling.  This immunoprofile is nonspecific but can be compatible with a poorly differentiated primary cholangiocarcinoma in absence of any other lesions.   NGS showed BAP 1 mutation.  Case was presented at tumor board, I have also discussed with pathologist Dr.Rubinas,  IHC pattern is more consistent with adenocarcinoma, unlikely HCC [due to negative HepPar 1 and arginase], cholangiocarcinoma is favored, although this is a diagnosis of exclusion. Second opinion ar [facility]   08/27/2022 Cancer Staging   Staging form: Intrahepatic Bile Duct, AJCC 8th Edition - Clinical stage from 08/27/2022: Stage IV (cT2, cN1, cM1) - Signed by [name], MD on 09/13/2022 Stage prefix: Initial  diagnosis   08/27/2022 Tumor Marker   CA 19.9  16 CEA 2.8   09/12/2022 Imaging   PET scan showed 1. Large right hepatic lobe mass is hypermetabolic and consistent with known cholangiocarcinoma. 2. Scattered borderline enlarged upper abdominal lymph nodes do not show any significant FDG uptake. 3. 4 hypermetabolic bone lesions consistent with metastatic disease. 4. Progressive diffuse airspace process in the left lung could be due to atypical pneumonia, pulmonary hemorrhage or left-sided aspiration. 5. Aortic atherosclerosis.  09/12/2022 I had a phone discussion with patient after his PET scan resulted.  Recommend systemic chemotherapy.  Patient would like to defer until his second opinion visit at [facility].   09/15/2022 Imaging   CT angio chest pulmonary embolism protocol showed 1. No evidence for pulmonary embolism. 2. Progression of bilateral multifocal patchy ground-glass and interstitial opacities, left greater than right. Findings are concerning for multifocal pneumonia. 3. Air-fluid levels in the left lower lobe bronchi worrisome for aspiration. 4. Mediastinal and hilar lymphadenopathy has increased in size and number when compared to the prior study, likely reactive.5. Cardiomegaly. 6. Findings compatible with pulmonary artery hypertension.7. Stable right liver mass and complex right renal cystic lesion. 8. Stable enlargement of the left thyroid.   09/15/2022 - 09/17/2022 Hospital Admission   Patient was admitted due to shortness of breath, CT showed progression of bilateral multifocal patchy groundglass and interstitial opacities.  Left greater than right.  Concerning for multifocal pneumonia.  Patient was started on broad-spectrum antibiotics with significant improvement.  He was discharged on oral antibiotics.   09/19/2022 Miscellaneous   Patient went to [facility] for [facility] opinion.  He was seen by Shirlee Latch, who felt that  the presentation is consistent with metastatic  cholangiocarcinoma, and agrees with the plan of cisplatin/gemcitabine/durvalumab..    10/11/2022 - 01/31/2023 Chemotherapy   Patient is on Treatment Plan : Cisplatin D1,8 + Gemcitabine + Durvalumab  D1,8 q21d x     10/12/2022 Imaging   CT chest w contrast 1. Bilateral airspace and ground-glass opacities have mildly decreased in amount and density when compared to the prior study. Findings are compatible with resolving multifocal pneumonia. Continued follow-up imaging recommended. 2. Stable enlarged heterogeneous left thyroid gland with 3.2 cm nodule. This can be further evaluated with dedicated ultrasound. 3. Stable hepatic and right renal lesions, incompletely evaluated.   Aortic Atherosclerosis (ICD10-I70.0).   12/11/2022 Imaging   PET showed  1. There is been mild increase in size of the tracer avid mass within right lobe of liver compatible with known cholangiocarcinoma. The degree of tracer uptake however is slightly decreased in the interval. 2. Multifocal tracer avid bone metastases are again noted. When compared with the previous exam there has been an mixed interval response to therapy. Specifically, there has been interval increase in size and FDG uptake associated with the right ischial tuberosity lesion and there is a new lesion within the posterior left iliac bone. Decreased tracer uptake is associated with the left scapular lesion and there is stable tracer uptake associated with the right T1 transverse process lesion. 3. Persistent ground-glass and airspace densities within the left upper lobe and left lower lobe compatible with inflammatory or infectious process. Interval resolution of previous right lung ground-glass and airspace densities.    12/24/2022 Procedure   Medi port placement by Dr. Wyn Quaker   01/22/2023 Imaging   CT chest abdomen pelvis with contrast 1. Hypodense lesion of the right lobe of the liver, hepatic segment VII/VIII is slightly diminished in size, consistent with  treatment response. 2. No evident change in additional smaller hypodense lesions scattered throughout the right lobe of the liver, these more difficult to see on prior noncontrast PET-CT. 3. Unchanged enlarged celiac axis, gastrohepatic ligament, and retroperitoneal lymph nodes. Unchanged prominent high right paratracheal lymph nodes. 4. Unchanged osseous metastases. 5. Slightly improved, although still extensive ground-glass airspace opacity primarily seen throughout the left lower lobe and lingula,nonspecific and infectious or inflammatory. 6. Coronary artery disease. 7. Emphysema.   02/11/2023 Imaging   Patient self decreased Lovenox dosage to 120mg  once daily.  Increased lower extremity swelling.   02/11/23 [place] bilateral lower extremity showed 1. Nonocclusive DVT within the popliteal veins bilaterally. 2. Occlusive DVT within the posterior tibial veins of the right calf.       02/13/2023 Imaging   CT chest Angiogram  1. Technically adequate exam showing no acute pulmonary embolus. 2. Cardiomegaly. Coronary artery calcifications. 3. Bilateral pleural effusions. 4. Improved aeration of the RIGHT UPPER and LOWER lobes compared to prior study. 5. LEFT LOWER lobe and lingula airspace filling and atelectasis, slightly improved compared to prior study. 6. Cirrhotic morphology of the liver. 7.7 centimeters mass in the RIGHT hepatic lobe. 7. Gastrohepatic ligament adenopathy. 8. A 7 millimeters sclerotic lesion in the UPPER sternum is suspicious for metastatic disease. 9. LEFT thyroid goiter.   02/13/2023 - 02/15/2023 Hospital Admission   A flutter with variable A-V block. Heart rate up to 140.  BNP 119.8 04/18: Echo no overt CHF - EF 50-55  Cardiology recs beta blocker to metoprolol succinate 50 mg bid, Torsemid 40mg  daily Adjusting Lovenox 14mg  BID.    03/03/2023 - 05/03/2023 Chemotherapy   Futibatinib 20mg , 03/12/23 Decreased  to 16mg  daily Stopped in early July due to cancer  progression.   04/24/2023 - 04/30/2023 Radiation Therapy   left ischium radiation.    05/20/2023 -  Chemotherapy   Patient is on Treatment Plan : COLORECTAL FOLFOX q14d      INTERVAL HISTORY Christopher Marsh is a 62 y.o. male who has above history reviewed by me today presents for follow up visit for Cholangiocarcinoma, antiphospholipid syndrome, thrombosis + Fatigue, He follows up with PCP'f office for coumadin dosing.  Denies any bleeding events.  + appetite is poor. He reports having low grade fever for 2-3 days during the interval. Decreased oral intake. No URI symptoms, no dysuria. Fever resolved. No fever today. Lost 9 pounds.    Review of Systems  Constitutional:  Positive for fatigue and unexpected weight change. Negative for appetite change, chills and fever.  HENT:   Negative for hearing loss and voice change.   Eyes:  Negative for eye problems.  Respiratory:  Negative for chest tightness, cough and shortness of breath.   Cardiovascular:  Positive for leg swelling. Negative for chest pain.  Gastrointestinal:  Negative for abdominal distention, abdominal pain, blood in stool and diarrhea.  Endocrine: Negative for hot flashes.  Genitourinary:  Negative for difficulty urinating and frequency.   Musculoskeletal:  Negative for arthralgias.  Skin:  Negative for itching.  Neurological:  Negative for extremity weakness.  Hematological:  Negative for adenopathy. Bruises/bleeds easily.  Psychiatric/Behavioral:  Negative for confusion.     MEDICAL HISTORY:  Past Medical History:  Diagnosis Date   Antiphospholipid antibody syndrome (HCC)    Arthritis    Cancer (HCC)    Diabetes mellitus without complication (HCC)    DVT (deep venous thrombosis) (HCC)    Lt leg   Dyspnea    Elevated lipids    Erythrocytosis    Hyperlipidemia    Hypertension    Lower extremity edema    Multinodular thyroid    Post-thrombotic syndrome     SURGICAL HISTORY: Past Surgical History:  Procedure  Laterality Date   CARDIOVERSION N/A 03/22/2023   Procedure: CARDIOVERSION;  Surgeon: Antonieta Iba, MD;  Location: [facility] ORS;  Service: Cardiovascular;  Laterality: N/A;   COLONOSCOPY WITH PROPOFOL N/A 09/23/2018   Per Dr. Norma Fredrickson, polyp, repeat in 3 yrs    EYE MUSCLE SURGERY     JOINT REPLACEMENT Left 07/22/2017   PORTA CATH INSERTION N/A 12/24/2022   Procedure: PORTA CATH INSERTION;  Surgeon: Annice Needy, MD;  Location: [facility] INVASIVE CV LAB;  Service: Cardiovascular;  Laterality: N/A;   TOTAL HIP ARTHROPLASTY Left 07/22/2017   Procedure: TOTAL HIP ARTHROPLASTY;  Surgeon: Donato Heinz, MD;  Location: [facility] ORS;  Service: Orthopedics;  Laterality: Left;    SOCIAL HISTORY: Social History   Socioeconomic History   Marital status: Married    Spouse name: Not on file   Number of children: Not on file   Years of education: Not on file   Highest education level: Not on file  Occupational History   Not on file  Tobacco Use   Smoking status: Former    Current packs/day: 0.00    Average packs/day: 0.5 packs/day for 6.0 years (3.0 ttl pk-yrs)    Types: Cigarettes    Start date: 60    Quit date: 7    Years since quitting: 42.8   Smokeless tobacco: Never   Tobacco comments:    Pt states when he did smoke he smoked at most 1 ppd. ALS  09/26/2022  Vaping Use   Vaping status: Never Used  Substance and Sexual Activity   Alcohol use: Not Currently    Comment: occ   Drug use: No   Sexual activity: Not on file  Other Topics Concern   Not on file  Social History Narrative   Not on file   Social Determinants of Health   Financial Resource Strain: Low Risk  (08/27/2022)   Overall Financial Resource Strain (CARDIA)    Difficulty of Paying Living Expenses: Not very hard  Food Insecurity: No Food Insecurity (02/18/2023)   Hunger Vital Sign    Worried About Running Out of Food in the Last Year: Never true    Christopher Out of Food in the Last Year: Never true  Transportation Needs: No  Transportation Needs (02/18/2023)   PRAPARE - Administrator, Civil Service (Medical): No    Lack of Transportation (Non-Medical): No  Physical Activity: Insufficiently Active (08/27/2022)   Exercise Vital Sign    Days of Exercise per Week: 3 days    Minutes of Exercise per Session: 30 min  Stress: Stress Concern Present (08/27/2022)   [employer] of Occupational Health - Occupational Stress Questionnaire    Feeling of Stress : Rather much  Social Connections: Socially Integrated (08/27/2022)   Social Connection and Isolation Panel [NHANES]    Frequency of Communication with Friends and Family: More than three times a week    Frequency of Social Gatherings with Friends and Family: More than three times a week    Attends Religious Services: 1 to 4 times per year    Active Member of [employer] or Organizations: Yes    Attends Banker Meetings: Never    Marital Status: Married  Catering manager Violence: Not At Risk (02/14/2023)   Humiliation, Afraid, Rape, and Kick questionnaire    Fear of Current or Ex-Partner: No    Emotionally Abused: No    Physically Abused: No    Sexually Abused: No    FAMILY HISTORY: Family History  Problem Relation Age of Onset   Breast cancer Mother    Emphysema Father    Colon cancer Maternal Grandmother     ALLERGIES:  has No Known Allergies.  MEDICATIONS:  Current Outpatient Medications  Medication Sig Dispense Refill   albuterol (VENTOLIN HFA) 108 (90 Base) MCG/ACT inhaler TAKE 2 PUFFS BY MOUTH EVERY 4 HOURS AS NEEDED 8.5 each 1   Calcium Carbonate 500 MG CHEW Chew 2 tablets (1,000 mg total) by mouth daily. 60 tablet 0   diphenoxylate-atropine (LOMOTIL) 2.5-0.025 MG tablet Take 1 tablet by mouth 4 (four) times daily as needed for diarrhea or loose stools. 120 tablet 3   fentaNYL (DURAGESIC) 50 MCG/HR Place 1 patch onto the skin every 3 (three) days. 10 patch 0   glipiZIDE (GLUCOTROL) 10 MG tablet TAKE 1 TABLET (10 MG TOTAL)  BY MOUTH TWICE A DAY BEFORE A MEAL 180 tablet 3   glucose blood test strip 1 each by Other route as needed for other. accu chek aviva plusUse as instructed     ketoconazole (NIZORAL) 2 % cream Apply 1 Application topically 2 (two) times daily as needed for irritation. 60 g 5   Lactulose 20 GM/30ML SOLN Take 30 mLs (20 g total) by mouth daily as needed (constipation.). 450 mL 0   lidocaine-prilocaine (EMLA) cream Apply 1 Application topically as needed. 30 g 5   LORazepam (ATIVAN) 0.5 MG tablet TAKE 1 TABLET (0.5 MG TOTAL) BY MOUTH  EVERY 8 (EIGHT) HOURS AS NEEDED FOR ANXIETY (NAUSEA VOMITING). 60 tablet 0   megestrol (MEGACE) 40 MG tablet Take 2 tablets (80 mg total) by mouth 2 (two) times daily. 120 tablet 0   metFORMIN (GLUCOPHAGE) 500 MG tablet TAKE TWO TABLETS BY MOUTH EVERY MORNING WITH BREAKFAST 180 tablet 1   MUCINEX D MAX STRENGTH [id number] MG TB12 Take 1,200 mg by mouth 2 (two) times daily as needed (for congestion or to loosen mucous in the chest).     ondansetron (ZOFRAN) 4 MG tablet Take by mouth.     oxyCODONE (OXY IR/ROXICODONE) 5 MG immediate release tablet Take 1 tablet (5 mg total) by mouth every 4 (four) hours as needed for severe pain. 60 tablet 0   pravastatin (PRAVACHOL) 20 MG tablet TAKE 1 TABLET BY MOUTH EVERY DAY 90 tablet 3   prochlorperazine (COMPAZINE) 10 MG tablet Take 10 mg by mouth every 6 (six) hours as needed.     silver sulfADIAZINE (SILVADENE) 1 % cream Apply 1 Application topically daily. 50 g 0   SLOW-MAG 71.5-119 MG TBEC SR tablet TAKE 1 TABLET (64 MG TOTAL) BY MOUTH DAILY 60 tablet 1   tadalafil (CIALIS) 20 MG tablet Take 0.5-1 tablets (10-20 mg total) by mouth every other day as needed for erectile dysfunction. 20 tablet 11   traZODone (DESYREL) 50 MG tablet TAKE 1 TABLET BY MOUTH AT BEDTIME AS NEEDED FOR SLEEP. 90 tablet 0   triamcinolone cream (KENALOG) 0.1 % Apply 1 Application topically 3 (three) times daily. 80 g 5   warfarin (COUMADIN) 5 MG tablet Take 1  tablet (5 mg total) by mouth daily. 30 tablet 5   metoprolol tartrate (LOPRESSOR) 50 MG tablet Take 1 tablet (50 mg total) by mouth 2 (two) times daily. 180 tablet 3   potassium chloride (KLOR-CON) 10 MEQ tablet Take 1 tablet (10 mEq total) by mouth daily. 90 tablet 3   torsemide (DEMADEX) 20 MG tablet Take 1 tablet (20 mg total) by mouth 2 (two) times daily as needed. (Patient not taking: Reported on 07/08/2023) 180 tablet 3   No current facility-administered medications for this visit.   Facility-Administered Medications Ordered in Other Visits  Medication Dose Route Frequency Provider Last Rate Last Admin   sodium chloride flush (NS) 0.9 % injection 10 mL  10 mL Intravenous PRN [name], MD   10 mL at 07/18/23 0951     PHYSICAL EXAMINATION: ECOG PERFORMANCE STATUS: 1 - Symptomatic but completely ambulatory Vitals:   08/26/23 0841  BP: (!) 103/52  Pulse: 67  Resp: 17  Temp: 98.4 F (36.9 C)  SpO2: 100%   Filed Weights   08/26/23 0841  Weight: 230 lb (104.3 kg)    Physical Exam Constitutional:      General: He is not in acute distress. HENT:     Head: Normocephalic and atraumatic.  Eyes:     General: No scleral icterus. Cardiovascular:     Rate and Rhythm: Normal rate.  Pulmonary:     Effort: Pulmonary effort is normal. No respiratory distress.     Breath sounds: No wheezing.  Abdominal:     General: Bowel sounds are normal. There is no distension.  Musculoskeletal:        General: Normal range of motion.     Cervical back: Normal range of motion and neck supple.     Right lower leg: Edema present.     Left lower leg: Edema present.  Skin:    General: Skin  is warm and dry.     Findings: No erythema.  Neurological:     Mental Status: He is alert and oriented to person, place, and time. Mental status is at baseline.  Psychiatric:        Mood and Affect: Mood normal.     LABORATORY DATA:  I have reviewed the data as listed    Latest Ref Rng & Units 08/26/2023     8:07 AM 08/07/2023    7:54 AM 07/24/2023    8:11 AM  CBC  WBC 4.0 - 10.5 K/uL 3.7  4.2  3.8   Hemoglobin 13.0 - 17.0 g/dL 9.6  16.1  09.6   Hematocrit 39.0 - 52.0 % 29.9  31.9  31.9   Platelets 150 - 400 K/uL 92  130  112       Latest Ref Rng & Units 08/26/2023    8:07 AM 08/07/2023    7:54 AM 07/24/2023    8:11 AM  CMP  Glucose 70 - 99 mg/dL 045  91  409   BUN 8 - 23 mg/dL 10  8  7    Creatinine 0.61 - 1.24 mg/dL 8.11  9.14  7.82   Sodium 135 - 145 mmol/L 134  135  132   Potassium 3.5 - 5.1 mmol/L 3.4  3.7  3.7   Chloride 98 - 111 mmol/L 103  105  101   CO2 22 - 32 mmol/L 25  25  25    Calcium 8.9 - 10.3 mg/dL 8.0  8.7  8.6   Total Protein 6.5 - 8.1 g/dL 5.5  5.8  5.6   Total Bilirubin 0.3 - 1.2 mg/dL 1.3  1.1  0.9   Alkaline Phos 38 - 126 U/L 108  100  104   AST 15 - 41 U/L 23  31  34   ALT 0 - 44 U/L 16  16  17      RADIOGRAPHIC STUDIES: I have personally reviewed the radiological images as listed and agreed with the findings in the report. No results found.

## 2023-08-26 NOTE — Assessment & Plan Note (Signed)
Due to poor oral intake secondary to decreased appetite.   Continue nutritional supplementation. Follow with nutritionist.  Recommend megace 80mg  BID. Rationale and side effects were reviewed with patient.

## 2023-08-26 NOTE — Patient Instructions (Addendum)
Pre visit review using our clinic review tool, if applicable. No additional management support is needed unless otherwise documented below in the visit note.  Hold dose tomorrow and hold dose the day after tomorrow and then change weekly dose to take 1/2 tablet daily except take 1 tablet daily on Sunday and Thursday. Recheck on 11/7 at [facility]

## 2023-08-26 NOTE — Assessment & Plan Note (Signed)
#  Recurrent lower extremity DVT, secondary to antiphospholipid syndrome.-. Need  life time anticoagulation Currently on coumadin with INR goal of 2-3.   INR pending  INR result will be sent to PCP's office for coumadin dosing.

## 2023-08-26 NOTE — Progress Notes (Signed)
Pt had INR lab draw today at oncology OV. Pt already took warfarin today. Hold dose tomorrow and hold dose the day after tomorrow and then change weekly dose to take 1/2 tablet daily except take 1 tablet daily on Sunday and Thursday. Recheck on 11/7 at [facility]. Pt agreed to coumadin clinic apt at [facility].

## 2023-08-26 NOTE — Assessment & Plan Note (Signed)
Recommend patient continue potassium chloride supplementation.

## 2023-08-26 NOTE — Assessment & Plan Note (Signed)
Continue calcium supplementation, currently on 1000 mg twice daily.   Recommend Vitamin D 1000 units daily.

## 2023-08-27 ENCOUNTER — Ambulatory Visit: Payer: BC Managed Care – PPO | Admitting: Physical Therapy

## 2023-08-27 ENCOUNTER — Ambulatory Visit: Payer: BC Managed Care – PPO | Admitting: Physician Assistant

## 2023-08-27 LAB — CANCER ANTIGEN 19-9: CA 19-9: 106 U/mL — ABNORMAL HIGH (ref 0–35)

## 2023-08-27 LAB — CEA: CEA: 9.4 ng/mL — ABNORMAL HIGH (ref 0.0–4.7)

## 2023-08-28 ENCOUNTER — Encounter: Payer: BC Managed Care – PPO | Admitting: Physical Therapy

## 2023-08-28 ENCOUNTER — Inpatient Hospital Stay: Payer: BC Managed Care – PPO

## 2023-08-29 ENCOUNTER — Encounter: Payer: Self-pay | Admitting: Oncology

## 2023-08-29 ENCOUNTER — Other Ambulatory Visit: Payer: Self-pay

## 2023-08-29 MED ORDER — FENTANYL 50 MCG/HR TD PT72: 1.0000 | MEDICATED_PATCH | TRANSDERMAL | 0 refills | Status: DC

## 2023-08-30 ENCOUNTER — Ambulatory Visit: Payer: BC Managed Care – PPO | Admitting: Physical Therapy

## 2023-09-02 ENCOUNTER — Encounter: Payer: BC Managed Care – PPO | Admitting: Physical Therapy

## 2023-09-04 ENCOUNTER — Ambulatory Visit: Payer: BC Managed Care – PPO | Admitting: Oncology

## 2023-09-04 ENCOUNTER — Encounter: Payer: BC Managed Care – PPO | Admitting: Physical Therapy

## 2023-09-04 ENCOUNTER — Ambulatory Visit: Payer: BC Managed Care – PPO

## 2023-09-04 ENCOUNTER — Other Ambulatory Visit: Payer: BC Managed Care – PPO

## 2023-09-05 ENCOUNTER — Ambulatory Visit: Payer: BC Managed Care – PPO

## 2023-09-05 ENCOUNTER — Telehealth: Payer: Self-pay

## 2023-09-05 DIAGNOSIS — Z7901 Long term (current) use of anticoagulants: Secondary | ICD-10-CM | POA: Diagnosis not present

## 2023-09-05 DIAGNOSIS — I4819 Other persistent atrial fibrillation: Secondary | ICD-10-CM

## 2023-09-05 DIAGNOSIS — Z86718 Personal history of other venous thrombosis and embolism: Secondary | ICD-10-CM

## 2023-09-05 LAB — PROTIME-INR
INR: 10.7 {ratio} (ref 0.8–1.0)
Prothrombin Time: 101.7 s (ref 9.6–13.1)

## 2023-09-05 LAB — POCT INR: INR: 8 — AB (ref 2.0–3.0)

## 2023-09-05 MED ORDER — PHYTONADIONE 5 MG PO TABS: ORAL_TABLET | ORAL | 0 refills | Status: DC

## 2023-09-05 NOTE — Telephone Encounter (Signed)
Noted. Audria Nine, NP, has been notified of critical result and plan of care discussed. Documented in anticoagulation encounter today.

## 2023-09-05 NOTE — Progress Notes (Addendum)
INR lab result is 10.7 today. Diagnosis for anticoagulation is APS, Afib and DVT. Discussed with Audria Nine, NP, and agreed to prescribe a 5 mg vitamin K tablet, pt is only to take 1/2 tablet (2.5 mg). Hold all warfarin until recheck on Monday, 11/11. If any s/s of abnormal bruising or bleeding to go to ER immediately. If any s/s of a clot to go to ER immediately.  Contacted pt and advised again, hold all warfarin until recheck INR on Monday. Advised to take 1/2 tablet of vitamin K. Pt has picked up script. Pt is planning a 4 hour trip to the [employer]. Advised to stop every hour to stretch legs. Advised if any s/s of a clot to go immediately to the ER. Educated pt on s/s of stroke, PE and DVT.  Advised if any s/s of abnormal bruising or bleeding to go immediately to the ER. Pt read back instructions for vitamin K and warfarin and reports he stops often on these trips due to frequent urination need from diuretic. Advised if edema worsens to contact cardiology for instructions concerning diuretic. Pt verbalized understanding and was appreciative of the concern for his health.

## 2023-09-05 NOTE — Patient Instructions (Addendum)
Pre visit review using our clinic review tool, if applicable. No additional management support is needed unless otherwise documented below in the visit note.  Hold all warfarin until recheck of INR and contacted by coumadin clinic nurse on 11/11. I will follow up with your lab INR result today when the result is returned. I will also contact pharmacies to inquire who may have a vitamin K tablet if needed. If any questions you can reach the coumadin clinic at [phone]. If you have any signs or symptoms of bleeding or abnormal bruising go to the ER.

## 2023-09-05 NOTE — Addendum Note (Signed)
Addended by: Sherrie George A on: 09/05/2023 02:55 PM   Modules accepted: Orders

## 2023-09-05 NOTE — Progress Notes (Addendum)
Pt reports recent BLE. He has started taking torsemide which was prescribed in the past for his edema. Pt reports some improvement in the edema since starting the medication. Pt denies any s/s of bleeding or abnormal bruising.  Lab INR was drawn and placed as STAT. Pt has an OV with oncology on Monday, 11/11 and will have lab INR drawn at that apt. Message sent to nurse in oncology. Hold all warfarin until recheck of INR and contacted by coumadin clinic nurse on 11/11. I will follow up with your lab INR result today when the result is returned. I will also contact pharmacies to inquire who may have a vitamin K tablet if needed. If any questions you can reach the coumadin clinic at [phone]. If you have any signs or symptoms of bleeding or abnormal bruising go to the ER.   Educated pt concerning s/s of abnormal bruising or bleeding. Pt denies any s/s currently. Pt verbalized understanding.    Contacted [facility] in [place], and spoke with pharmacist, Reita May B, who reports she has one tablet of the generic vitamin K, phytonadione, 5 mg. Advised a script will be sent for the one tablet. Advised pharmacist of pt's name and date of birth.  She verbalized understanding.  Sent in script to pharmacy.

## 2023-09-05 NOTE — Telephone Encounter (Addendum)
Clydie Braun with [facility] lab called critical lab INR 10.7 and PT 102. Test was repeated.Sending to Sherrie George RN; have spoken with Carollee Herter and recorded in lab notebook.

## 2023-09-09 ENCOUNTER — Inpatient Hospital Stay: Payer: BC Managed Care – PPO

## 2023-09-09 ENCOUNTER — Other Ambulatory Visit: Payer: BC Managed Care – PPO

## 2023-09-09 ENCOUNTER — Ambulatory Visit: Payer: BC Managed Care – PPO

## 2023-09-09 ENCOUNTER — Encounter: Payer: Self-pay | Admitting: Oncology

## 2023-09-09 ENCOUNTER — Encounter: Payer: BC Managed Care – PPO | Admitting: Physical Therapy

## 2023-09-09 ENCOUNTER — Telehealth: Payer: Self-pay

## 2023-09-09 ENCOUNTER — Inpatient Hospital Stay (HOSPITAL_BASED_OUTPATIENT_CLINIC_OR_DEPARTMENT_OTHER): Payer: BC Managed Care – PPO | Admitting: Oncology

## 2023-09-09 ENCOUNTER — Ambulatory Visit: Payer: BC Managed Care – PPO | Admitting: Oncology

## 2023-09-09 ENCOUNTER — Ambulatory Visit (INDEPENDENT_AMBULATORY_CARE_PROVIDER_SITE_OTHER): Payer: BC Managed Care – PPO

## 2023-09-09 ENCOUNTER — Inpatient Hospital Stay: Payer: BC Managed Care – PPO | Attending: Oncology | Admitting: Hospice and Palliative Medicine

## 2023-09-09 VITALS — BP 92/52 | HR 82 | Temp 99.2°F | Resp 18 | Wt 226.2 lb

## 2023-09-09 DIAGNOSIS — D6861 Antiphospholipid syndrome: Secondary | ICD-10-CM

## 2023-09-09 DIAGNOSIS — Z515 Encounter for palliative care: Secondary | ICD-10-CM | POA: Diagnosis not present

## 2023-09-09 DIAGNOSIS — E042 Nontoxic multinodular goiter: Secondary | ICD-10-CM | POA: Insufficient documentation

## 2023-09-09 DIAGNOSIS — E876 Hypokalemia: Secondary | ICD-10-CM | POA: Diagnosis not present

## 2023-09-09 DIAGNOSIS — C7951 Secondary malignant neoplasm of bone: Secondary | ICD-10-CM | POA: Diagnosis not present

## 2023-09-09 DIAGNOSIS — I4891 Unspecified atrial fibrillation: Secondary | ICD-10-CM | POA: Insufficient documentation

## 2023-09-09 DIAGNOSIS — I959 Hypotension, unspecified: Secondary | ICD-10-CM | POA: Insufficient documentation

## 2023-09-09 DIAGNOSIS — C221 Intrahepatic bile duct carcinoma: Secondary | ICD-10-CM

## 2023-09-09 DIAGNOSIS — Z452 Encounter for adjustment and management of vascular access device: Secondary | ICD-10-CM | POA: Diagnosis not present

## 2023-09-09 DIAGNOSIS — Z86718 Personal history of other venous thrombosis and embolism: Secondary | ICD-10-CM | POA: Diagnosis not present

## 2023-09-09 DIAGNOSIS — Z7901 Long term (current) use of anticoagulants: Secondary | ICD-10-CM

## 2023-09-09 DIAGNOSIS — Z5111 Encounter for antineoplastic chemotherapy: Secondary | ICD-10-CM | POA: Insufficient documentation

## 2023-09-09 DIAGNOSIS — I480 Paroxysmal atrial fibrillation: Secondary | ICD-10-CM

## 2023-09-09 DIAGNOSIS — R634 Abnormal weight loss: Secondary | ICD-10-CM

## 2023-09-09 LAB — CMP (CANCER CENTER ONLY)
ALT: 13 U/L (ref 0–44)
AST: 22 U/L (ref 15–41)
Albumin: 2.4 g/dL — ABNORMAL LOW (ref 3.5–5.0)
Alkaline Phosphatase: 97 U/L (ref 38–126)
Anion gap: 10 (ref 5–15)
BUN: 11 mg/dL (ref 8–23)
CO2: 24 mmol/L (ref 22–32)
Calcium: 7.9 mg/dL — ABNORMAL LOW (ref 8.9–10.3)
Chloride: 98 mmol/L (ref 98–111)
Creatinine: 0.96 mg/dL (ref 0.61–1.24)
GFR, Estimated: >60 mL/min (ref >60)
Glucose, Bld: 120 mg/dL — ABNORMAL HIGH (ref 70–99)
Potassium: 2.7 mmol/L — CL (ref 3.5–5.1)
Sodium: 132 mmol/L — ABNORMAL LOW (ref 135–145)
Total Bilirubin: 1.4 mg/dL — ABNORMAL HIGH (ref <1.2)
Total Protein: 5.6 g/dL — ABNORMAL LOW (ref 6.5–8.1)

## 2023-09-09 LAB — CBC WITH DIFFERENTIAL (CANCER CENTER ONLY)
Abs Immature Granulocytes: 0.03 10*3/uL (ref 0.00–0.07)
Basophils Absolute: 0 10*3/uL (ref 0.0–0.1)
Basophils Relative: 1 %
Eosinophils Absolute: 0 10*3/uL (ref 0.0–0.5)
Eosinophils Relative: 0 %
HCT: 26.2 % — ABNORMAL LOW (ref 39.0–52.0)
Hemoglobin: 8.5 g/dL — ABNORMAL LOW (ref 13.0–17.0)
Immature Granulocytes: 1 %
Lymphocytes Relative: 3 %
Lymphs Abs: 0.2 10*3/uL — ABNORMAL LOW (ref 0.7–4.0)
MCH: 30.4 pg (ref 26.0–34.0)
MCHC: 32.4 g/dL (ref 30.0–36.0)
MCV: 93.6 fL (ref 80.0–100.0)
Monocytes Absolute: 0.6 10*3/uL (ref 0.1–1.0)
Monocytes Relative: 12 %
Neutro Abs: 4.3 10*3/uL (ref 1.7–7.7)
Neutrophils Relative %: 83 %
Platelet Count: 119 10*3/uL — ABNORMAL LOW (ref 150–400)
RBC: 2.8 MIL/uL — ABNORMAL LOW (ref 4.22–5.81)
RDW: 14.4 % (ref 11.5–15.5)
WBC Count: 5.1 10*3/uL (ref 4.0–10.5)
nRBC: 0 % (ref 0.0–0.2)

## 2023-09-09 LAB — PROTIME-INR
INR: 2.3 — ABNORMAL HIGH (ref 0.8–1.2)
Prothrombin Time: 25.4 s — ABNORMAL HIGH (ref 11.4–15.2)

## 2023-09-09 MED ORDER — SODIUM CHLORIDE 0.9% FLUSH
10.0000 mL | Freq: Once | INTRAVENOUS | Status: AC
  Administered 2023-09-09: 10 mL via INTRAVENOUS
  Filled 2023-09-09: qty 10

## 2023-09-09 MED ORDER — HEPARIN SOD (PORK) LOCK FLUSH 100 UNIT/ML IV SOLN
500.0000 [IU] | Freq: Once | INTRAVENOUS | Status: AC
  Administered 2023-09-09: 500 [IU] via INTRAVENOUS
  Filled 2023-09-09: qty 5

## 2023-09-09 MED ORDER — HEPARIN SOD (PORK) LOCK FLUSH 100 UNIT/ML IV SOLN
500.0000 [IU] | Freq: Once | INTRAVENOUS | Status: DC
  Filled 2023-09-09: qty 5

## 2023-09-09 MED ORDER — POTASSIUM CHLORIDE CRYS ER 20 MEQ PO TBCR: 20.0000 meq | EXTENDED_RELEASE_TABLET | Freq: Two times a day (BID) | ORAL | 0 refills | Status: DC

## 2023-09-09 NOTE — Assessment & Plan Note (Signed)
S/p palliative  radiation  Consider bisphosphonate after dental clearance is obtained. S/p left ischium.  radiation due to concern of pathological fracture.  Right thigh pain, no RT per Radonc. Felt was due to arthritis.  CT showed worse of bone metastatic disease, T12 epidural tumor extension- MRI thoracic and lumbar spine showed T12 epidural paraspinal tumor extension.  S/p  radiation of  T12

## 2023-09-09 NOTE — Assessment & Plan Note (Signed)
#  Recurrent lower extremity DVT, secondary to antiphospholipid syndrome.-. Need  life time anticoagulation Currently on coumadin with INR goal of 2-3.   INR 2.3  currently off coumadin due to super theraputic INR.  Recommend to switch to Eliquis after INR drops below 2.  Discussed the the limited data of effectiveness of Eliquis for antiphospholipid syndrome and given that his overall prognosis is poor, switching to Eliquis may help improve his life quality. He agrees with the plan.

## 2023-09-09 NOTE — Progress Notes (Signed)
Pt here for follow up. Complains of feeling lightheaded and constant edema to legs

## 2023-09-09 NOTE — Telephone Encounter (Signed)
Per oncology, I had a discussion with patient today. Shared decision was made to not going back to Eliquis.  His INR is above 2, I recommend him not to start coumadin today, and have scheduled him to repeat INR later this week, once INR is less than 2, I will start him on Eliquis so he has better life quality. His cancer is progressing and prognosis is poor.  Thank you so much for help the coumadin dosing on him.  Zhou   Contacted pt and advised there I a plan to restart Eliquis after INR check at oncology on 11/15. Advised pt to not start warfarin today, per oncology and if INR < 2 on 11/15, he will start Eliquis. Advised pt also of need to pick up potassium at pharmacy and oncology will retest potassium level on 11/15 also. Advised if anything is needed do not hesitate to contact coumadin clinic. Pt verbalized understanding.   Cancelled coumadin clinic apt for 11/14.

## 2023-09-09 NOTE — Assessment & Plan Note (Signed)
Continue calcium supplementation, currently on 1000 mg twice daily.   Recommend Vitamin D 1000 units daily.

## 2023-09-09 NOTE — Telephone Encounter (Signed)
Pt had supratherapeutic INR, 10.7, on 11/7, and was advised to hold all warfarin until recheck today. Pt was also prescribed vitamin K tablet, 2.5 mg, which pt took on 11/7. Could have been caused by increased BLE. Pt denied any bleeding.  Oncology drew lab INR at pt's apt today. INR is 2.3 Pt reports at his oncology apt today the oncologist was discussing changing anticoagulant to Eliquis but she did not change it today. Pt is requesting a msg be sent to oncologist also to let them know of INR today and plan of care going forward. Advised a msg would be sent and if any changes are made the coumadin clinic or oncology would contact him.  Will restart warfarin at 2.5 mg (1/2 tablet) for the next three days starting today and recheck INR on 11/14 at [facility] coumadin clinic. Pt read back instructions and verbalized understanding.  Sent msg to oncologist.

## 2023-09-09 NOTE — Assessment & Plan Note (Signed)
Encourage oral hydration.  Recommend patient cut back on diuretics.  Also, hold evening dose of metoprolol today and encourage patient to measure BP daily, and if SBP is <100, recommend him to hold off BP meds.

## 2023-09-09 NOTE — Assessment & Plan Note (Addendum)
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 [facility] 2nd opinion was reviewed.  Recommend to proceed with 3rd line treatment with FOLFOX, CT showed mixed response after 3 cycles, he tolerated with moderate/severe difficulties.  Labs are reviewed and discussed with patient. Hold chemotherapy due to weight loss and fatigue. Will repeat CT scan.  Re-evaluate in 2-3 weeks. Discussed about option of hospice/comfort care.  Patient would like to take a break and see how he feels and decide.

## 2023-09-09 NOTE — Telephone Encounter (Signed)
Dr. Cathie Hoops has sent in Rx for potassium BID and would like for pt to come back Friday for lab/poss IVF, poss K. Pt informed of plan.   Please schedule lab/ poss IVF/ poss K on 11/15. Pt will see appt on Mychart.

## 2023-09-09 NOTE — Assessment & Plan Note (Signed)
Due to poor oral intake secondary to decreased appetite.   Continue nutritional supplementation. Follow with nutritionist.  Recommend megace 80mg  BID.

## 2023-09-09 NOTE — Assessment & Plan Note (Signed)
Recommend patient continue potassium chloride supplementation BID for 5 days. This is likely due to diuretic use.  Repeat K in 3-4 days.

## 2023-09-09 NOTE — Patient Instructions (Addendum)
Pre visit review using our clinic review tool, if applicable. No additional management support is needed unless otherwise documented below in the visit note.  Take 1/2 tablet (2.5 mg) daily until recheck on 11/14. Do not take warfarin on 11/14 until INR is checked. Appointment for coumadin clinic on 11/14 at 12:30.

## 2023-09-09 NOTE — Assessment & Plan Note (Signed)
And cardiomyopathy, follow up with cardiology Rate controlled. S/p ablation on 03/26/23  

## 2023-09-09 NOTE — Progress Notes (Signed)
Hematology/Oncology Progress note [phone] [phone]) [phone]    CHIEF COMPLAINTS/REASON FOR VISIT:  Follow up for right lower extremity DVT, antiphospholipid syndrome, metastatic cholangiocarcinoma.  ASSESSMENT & PLAN:   Cancer Staging  Cholangiocarcinoma [name]) Staging form: Intrahepatic Bile Duct, AJCC 8th Edition - Clinical stage from 08/27/2022: Stage IV (cT2, cN1, cM1) - Signed by [name], MD on 09/13/2022   Cholangiocarcinoma (HCC) 6Cholangiocarcinoma, NGS-Tempus liquid biopsy FGFR2 -ADK mutation, TMB 0,  S/p Gemcitabine, Cisplatin and Durvalumab x 6 , 01/2023 CT progression/enlarged liver lesions --03/03/23 2nd line Futibatinib- 04/26/23 CT showed interval progression  Recommend Christopher Marsh to stop Futibatinib. [facility] 2nd opinion was reviewed.  Recommend to proceed with 3rd line treatment with FOLFOX, CT showed mixed response after 3 cycles, Christopher Marsh tolerated with moderate/severe difficulties.  Labs are reviewed and discussed with Christopher Marsh. Hold chemotherapy due to weight loss and fatigue. Will repeat CT scan.  Re-evaluate in 2-3 weeks. Discussed about option of hospice/comfort care.  Christopher Marsh would like to take a break and see how Christopher Marsh feels and decide.    Antiphospholipid syndrome (HCC) #Recurrent lower extremity DVT, secondary to antiphospholipid syndrome.-. Need  life time anticoagulation Currently on coumadin with INR goal of 2-3.   INR 2.3  currently off coumadin due to super theraputic INR.  Recommend to switch to Eliquis after INR drops below 2.  Discussed the the limited data of effectiveness of Eliquis for antiphospholipid syndrome and given that his overall prognosis is poor, switching to Eliquis may help improve his life quality. Christopher Marsh agrees with the plan.     Hypocalcemia Continue calcium supplementation, currently on 1000 mg twice daily.   Recommend Vitamin D 1000 units daily.   Metastasis to bone [facility]) S/p palliative  radiation  Consider  bisphosphonate after dental clearance is obtained. S/p left ischium.  radiation due to concern of pathological fracture.  Right thigh pain, no RT per Radonc. Felt was due to arthritis.  CT showed worse of bone metastatic disease, T12 epidural tumor extension- MRI thoracic and lumbar spine showed T12 epidural paraspinal tumor extension.  S/p  radiation of  T12   Hypokalemia Recommend Christopher Marsh continue potassium chloride supplementation BID for 5 days. This is likely due to diuretic use.  Repeat K in 3-4 days.   Atrial fibrillation (HCC) And cardiomyopathy, follow up with cardiology Rate controlled. S/p ablation on 03/26/23   Weight loss Due to poor oral intake secondary to decreased appetite.   Continue nutritional supplementation. Follow with nutritionist.  Recommend megace 80mg  BID.  Hypotension Encourage oral hydration.  Recommend Christopher Marsh cut back on diuretics.  Also, hold evening dose of metoprolol today and encourage Christopher Marsh to measure BP daily, and if SBP is <100, recommend him to hold off BP meds.     Orders Placed This Encounter  Procedures   CT CHEST ABDOMEN PELVIS W CONTRAST    [employer] COMM PPO [id number] valid 09/09/23-10/08/23 Wt 226/No Needs /nt diabetic/nkda to iv dye or contrast/no kid dz or liver dz /No spinal cord/No body injector/no glucose mon/no heart monitor//ab w/ashley Please remember if you need to cancel your appt, please do so 24 hours prior to your appointment to avoid getting charged a no-show fee of $75.00 pt is aware/pt verbal understood instructions given PT HAS PORT    Standing Status:   Future    Standing Expiration Date:   09/08/2024    Order Specific Question:   If indicated for the ordered procedure, I authorize the administration of contrast media per Radiology protocol  Answer:   Yes    Order Specific Question:   Does the Christopher Marsh have a contrast media/X-ray dye allergy?    Answer:   No    Order Specific Question:   Preferred  imaging location?    Answer:   [facility]    Order Specific Question:   If indicated for the ordered procedure, I authorize the administration of oral contrast media per Radiology protocol    Answer:   Yes   Cancer antigen 19-9    Standing Status:   Future    Standing Expiration Date:   10/01/2024   CEA    Standing Status:   Future    Standing Expiration Date:   10/01/2024   CBC with Differential (Cancer Center Only)    Standing Status:   Future    Standing Expiration Date:   10/01/2024   CMP (Cancer Center only)    Standing Status:   Future    Standing Expiration Date:   10/01/2024   Follow up 2-3  weeks lab MD chemo  All questions were answered. The Christopher Marsh knows to call the clinic with any problems, questions or concerns.  [name], MD, PhD [facility] Hematology Oncology 09/09/2023    HISTORY OF PRESENTING ILLNESS:  Christopher Marsh reports remote history of left lower extremity DVT in 2002.  Christopher Marsh was initiated on Lovenox and bridged to Coumadin.  Christopher Marsh took warfarin for 2 years before anticoagulation was stopped. 12/03/2021, Christopher Marsh presented to emergency room for evaluation of right lower extremity pain and swelling for about a week.  Started on the inner side of right thigh and migrated to the right calf. + Associated with swelling.  Denies any recent injury, hospitalization, surgery.  Christopher Marsh first noticed the symptoms after playing basketball with his grandson.   12/03/2021, right lower extremity ultrasound showed occlusive DVT extending from the mid aspect of the femoral vein through the imaged tibial vein.  Age-indeterminate. Christopher Marsh was started on Xarelto. Christopher Marsh was referred to establish care with vascular surgeon and was seen by Sheppard Plumber on 12/06/2021.  Shared decision was made not to proceed with embolectomy.  Continue anticoagulation. Christopher Marsh was referred to hematology oncology for further evaluation.  Christopher Marsh denies any family history of blood clots.  Denies any unintentional weight  loss, fever or night sweats. 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  Christopher Marsh sometimes stay in his car while waiting for next assessment appointment.  Christopher Marsh reports the right lower extremity symptom has improved since the start of Xarelto.  No active bleeding events.  #12/18/2021 hypercoagulable work-up showed JAK2 V617F mutation negative, with reflex to other mutations CALR, MPL, JAK 2 Ex 12-15 mutations negative, negative anticardiolipin IgG and IgM antibodies, positive lupus anticoagulant, negative factor V Leiden mutation, negative prothrombin gene mutation, normal protein C activity, normal protein S antigen level. Christopher Marsh was recommended to switch to Coumadin with INR goal of 2-3.  #03/15/2022, repeat lupus anticoagulant is persistently positive- + antiphospholipid syndrome.  Christopher Marsh is currently on Lovenox 1 mg/kg twice daily.  admitted due to multifocal pneumonia, treated with Influenza panel negative, COVID-negative, HIV negative, Complete course of antibiotics with Zithromax, Vantin   + HCV antibody positive, HCV RNA quantification not detected. HCV RT PCR not detected.   INTERVAL HISTORY Christopher Marsh is a 62 y.o. male who has above history reviewed by me today presents for follow up visit for management of right lower extremity DVT  and antiphospholipid syndrome, metastatic cholangiocarcinoma.  Oncology History  Cholangiocarcinoma (HCC)  10/30/2021 Imaging   CT chest wo contrast 1. Heterogeneous bilateral ground-glass disease, multilobar but worst in the right middle lobe, lingula and lower lobes. There is progression of the abnormality since 10/12/2022. Though pneumonia could produce this appearance, given persistence and fluctuating appearance since October, consider alternative entities such as hypersensitivity pneumonitis or idiopathic interstitial pneumonia. 2. Slightly enlarged pulmonary  trunk, possible arterial hypertension 3. Redemonstrated large hypodense right hepatic lobe mass.   08/18/2022 Imaging   CT chest w contrast showed 1. Multifocal airspace disease in both lungs, left side greater than right. Findings are most compatible with multifocal pneumonia. 2. Multiple new hepatic lesions with enlarging lymph nodes in the upper abdomen and chest. Findings are concerning for metastatic disease. Based on the multifocal pneumonia, hepatic abscesses would also be in the differential diagnosis. Recommend further characterization of the abdomen and pelvis with CT with IV contrast. 3. Enlargement of the main pulmonary artery could be associated with pulmonary hypertension. 4. Coronary artery calcifications. 5. Multinodular goiter. Christopher Marsh has known thyroid nodules and previous thyroid ultrasound.    08/18/2022 Imaging   CT abdomen pelvis w contrast  Interval development of 6.5 x 5.2 cm heterogeneously enhancing mass in right hepatic lobe. This is highly concerning for neoplasm or malignancy, and further evaluation with MRI is recommended.   Mildly enlarged periaortic adenopathy is noted, with the largest lymph node measuring 12 mm. Metastatic disease cannot be excluded. Mildly enlarged adenopathy is also noted in the gastrohepatic ligament, but this is unchanged compared to prior exam.   Stable bibasilar lung opacities are noted concerning for inflammation, atelectasis or possibly scarring.   Grossly stable multi-septated cystic lesion is seen involving upper pole of right kidney with peripheral calcifications compared to prior exam of 2019, most likely representing benign etiology.   Aortic Atherosclerosis    08/18/2022 - 08/21/2022 Hospital Admission   Admitted due to multifocal pneumonia, treated with Influenza panel negative, COVID-negative, HIV negative, Complete course of antibiotics with Zithromax, Vantin     08/19/2022 Imaging   MR abdomen w wo contrast  Marked  caudate lobe hypertrophy, highly suspicious for hepatic cirrhosis.   Numerous hypervascular masses throughout the right hepatic lobe, highly suspicious for multifocal hepatocellular carcinoma. Recommend correlation with AFP and consider tissue sampling.   Mild abdominal lymphadenopathy, with differential diagnosis including metastatic disease and reactive lymphadenopathy in setting of cirrhosis.   Bilateral lower lobe infiltrates, as better demonstrated on recent CT.    08/19/2022 Tumor Marker   AFP 4.1   08/27/2022 Initial Diagnosis   Cholangiocarcinoma  -08/20/22 Liver mass biopsy showed poorly differentiated carcinoma Immunohistochemical stains show that the poorly differentiated carcinoma is positive for CK7 and MOC-31, suggestive of a poorly differentiated adenocarcinoma.  Immunohistochemical stains for CK20, CDX2, HepPar 1 and arginase are negative.  Immunostain for glypican-3 shows very focal  likely nonspecific labeling.  This immunoprofile is nonspecific but can be compatible with a poorly differentiated primary cholangiocarcinoma in absence of any other lesions.   NGS showed BAP 1 mutation.  Case was presented at tumor board, I have also discussed with pathologist Dr.Rubinas,  IHC pattern is more consistent with adenocarcinoma, unlikely HCC [due to negative HepPar 1 and arginase], cholangiocarcinoma is favored, although this is a diagnosis of exclusion. Second opinion ar [facility]   08/27/2022 Cancer Staging   Staging form: Intrahepatic Bile Duct, AJCC 8th Edition - Clinical stage from 08/27/2022: Stage IV (cT2, cN1, cM1) - Signed  by [name], MD on 09/13/2022 Stage prefix: Initial diagnosis   08/27/2022 Tumor Marker   CA 19.9  16 CEA 2.8   09/12/2022 Imaging   PET scan showed 1. Large right hepatic lobe mass is hypermetabolic and consistent with known cholangiocarcinoma. 2. Scattered borderline enlarged upper abdominal lymph nodes do not show any significant FDG uptake. 3. 4  hypermetabolic bone lesions consistent with metastatic disease. 4. Progressive diffuse airspace process in the left lung could be due to atypical pneumonia, pulmonary hemorrhage or left-sided aspiration. 5. Aortic atherosclerosis.  09/12/2022 I had a phone discussion with Christopher Marsh after his PET scan resulted.  Recommend systemic chemotherapy.  Christopher Marsh would like to defer until his second opinion visit at [facility].   09/15/2022 Imaging   CT angio chest pulmonary embolism protocol showed 1. No evidence for pulmonary embolism. 2. Progression of bilateral multifocal patchy ground-glass and interstitial opacities, left greater than right. Findings are concerning for multifocal pneumonia. 3. Air-fluid levels in the left lower lobe bronchi worrisome for aspiration. 4. Mediastinal and hilar lymphadenopathy has increased in size and number when compared to the prior study, likely reactive.5. Cardiomegaly. 6. Findings compatible with pulmonary artery hypertension.7. Stable right liver mass and complex right renal cystic lesion. 8. Stable enlargement of the left thyroid.   09/15/2022 - 09/17/2022 Hospital Admission   Christopher Marsh was admitted due to shortness of breath, CT showed progression of bilateral multifocal patchy groundglass and interstitial opacities.  Left greater than right.  Concerning for multifocal pneumonia.  Christopher Marsh was started on broad-spectrum antibiotics with significant improvement.  Christopher Marsh was discharged on oral antibiotics.   09/19/2022 Miscellaneous   Christopher Marsh went to [facility] for second opinion.  Christopher Marsh was seen by Shirlee Latch, who felt that the presentation is consistent with metastatic cholangiocarcinoma, and agrees with the plan of cisplatin/gemcitabine/durvalumab..    10/11/2022 - 01/31/2023 Chemotherapy   Christopher Marsh is on Treatment Plan : Cisplatin D1,8 + Gemcitabine + Durvalumab  D1,8 q21d x     10/12/2022 Imaging   CT chest w contrast 1. Bilateral airspace and ground-glass opacities have mildly  decreased in amount and density when compared to the prior study. Findings are compatible with resolving multifocal pneumonia. Continued follow-up imaging recommended. 2. Stable enlarged heterogeneous left thyroid gland with 3.2 cm nodule. This can be further evaluated with dedicated ultrasound. 3. Stable hepatic and right renal lesions, incompletely evaluated.   Aortic Atherosclerosis (ICD10-I70.0).   12/11/2022 Imaging   PET showed  1. There is been mild increase in size of the tracer avid mass within right lobe of liver compatible with known cholangiocarcinoma. The degree of tracer uptake however is slightly decreased in the interval. 2. Multifocal tracer avid bone metastases are again noted. When compared with the previous exam there has been an mixed interval response to therapy. Specifically, there has been interval increase in size and FDG uptake associated with the right ischial tuberosity lesion and there is a new lesion within the posterior left iliac bone. Decreased tracer uptake is associated with the left scapular lesion and there is stable tracer uptake associated with the right T1 transverse process lesion. 3. Persistent ground-glass and airspace densities within the left upper lobe and left lower lobe compatible with inflammatory or infectious process. Interval resolution of previous right lung ground-glass and airspace densities.    12/24/2022 Procedure   Medi port placement by Dr. Wyn Quaker   01/22/2023 Imaging   CT chest abdomen pelvis with contrast 1. Hypodense lesion of the right lobe of the liver, hepatic  segment VII/VIII is slightly diminished in size, consistent with treatment response. 2. No evident change in additional smaller hypodense lesions scattered throughout the right lobe of the liver, these more difficult to see on prior noncontrast PET-CT. 3. Unchanged enlarged celiac axis, gastrohepatic ligament, and retroperitoneal lymph nodes. Unchanged prominent high right  paratracheal lymph nodes. 4. Unchanged osseous metastases. 5. Slightly improved, although still extensive ground-glass airspace opacity primarily seen throughout the left lower lobe and lingula,nonspecific and infectious or inflammatory. 6. Coronary artery disease. 7. Emphysema.   02/11/2023 Imaging   Christopher Marsh self decreased Lovenox dosage to 120mg  once daily.  Increased lower extremity swelling.   02/11/23 [place] bilateral lower extremity showed 1. Nonocclusive DVT within the popliteal veins bilaterally. 2. Occlusive DVT within the posterior tibial veins of the right calf.       02/13/2023 Imaging   CT chest Angiogram  1. Technically adequate exam showing no acute pulmonary embolus. 2. Cardiomegaly. Coronary artery calcifications. 3. Bilateral pleural effusions. 4. Improved aeration of the RIGHT UPPER and LOWER lobes compared to prior study. 5. LEFT LOWER lobe and lingula airspace filling and atelectasis, slightly improved compared to prior study. 6. Cirrhotic morphology of the liver. 7.7 centimeters mass in the RIGHT hepatic lobe. 7. Gastrohepatic ligament adenopathy. 8. A 7 millimeters sclerotic lesion in the UPPER sternum is suspicious for metastatic disease. 9. LEFT thyroid goiter.   02/13/2023 - 02/15/2023 Hospital Admission   A flutter with variable A-V block. Heart rate up to 140.  BNP 119.8 04/18: Echo no overt CHF - EF 50-55  Cardiology recs beta blocker to metoprolol succinate 50 mg bid, Torsemid 40mg  daily Adjusting Lovenox 14mg  BID.    03/03/2023 - 05/03/2023 Chemotherapy   Futibatinib 20mg , 03/12/23 Decreased to 16mg  daily Stopped in early July due to cancer progression.   04/24/2023 - 04/30/2023 Radiation Therapy   left ischium radiation.    05/20/2023 -  Chemotherapy   Christopher Marsh is on Treatment Plan : COLORECTAL FOLFOX q14d      INTERVAL HISTORY Christopher Marsh is a 62 y.o. male who has above history reviewed by me today presents for follow up visit for  Cholangiocarcinoma, antiphospholipid syndrome, thrombosis + Fatigue, Christopher Marsh follows up with PCP'f office for coumadin dosing.  Denies any bleeding events.  + appetite is fair. Lost 4 more pounds since 2 weeks ago. Christopher Marsh feels week.  Pain is controlled.  + lightheaded. No nausea vomiting diarrhea. Christopher Marsh reports drinking enough water daily.    Review of Systems  Constitutional:  Positive for appetite change, fatigue and unexpected weight change. Negative for chills and fever.  HENT:   Negative for hearing loss and voice change.   Eyes:  Negative for eye problems.  Respiratory:  Negative for chest tightness, cough and shortness of breath.   Cardiovascular:  Positive for leg swelling. Negative for chest pain.  Gastrointestinal:  Negative for abdominal distention, abdominal pain, blood in stool and diarrhea.  Endocrine: Negative for hot flashes.  Genitourinary:  Negative for difficulty urinating and frequency.   Musculoskeletal:  Negative for arthralgias.  Skin:  Negative for itching.  Neurological:  Negative for extremity weakness.  Hematological:  Negative for adenopathy. Bruises/bleeds easily.  Psychiatric/Behavioral:  Negative for confusion.     MEDICAL HISTORY:  Past Medical History:  Diagnosis Date   Antiphospholipid antibody syndrome (HCC)    Arthritis    Cancer (HCC)    Diabetes mellitus without complication (HCC)    DVT (deep venous thrombosis) (HCC)    Lt leg  Dyspnea    Elevated lipids    Erythrocytosis    Hyperlipidemia    Hypertension    Lower extremity edema    Multinodular thyroid    Post-thrombotic syndrome     SURGICAL HISTORY: Past Surgical History:  Procedure Laterality Date   CARDIOVERSION N/A 03/22/2023   Procedure: CARDIOVERSION;  Surgeon: Antonieta Iba, MD;  Location: [facility] ORS;  Service: Cardiovascular;  Laterality: N/A;   COLONOSCOPY WITH PROPOFOL N/A 09/23/2018   Per Dr. Norma Fredrickson, polyp, repeat in 3 yrs    EYE MUSCLE SURGERY     JOINT REPLACEMENT Left  07/22/2017   PORTA CATH INSERTION N/A 12/24/2022   Procedure: PORTA CATH INSERTION;  Surgeon: Annice Needy, MD;  Location: [facility] INVASIVE CV LAB;  Service: Cardiovascular;  Laterality: N/A;   TOTAL HIP ARTHROPLASTY Left 07/22/2017   Procedure: TOTAL HIP ARTHROPLASTY;  Surgeon: Donato Heinz, MD;  Location: [facility] ORS;  Service: Orthopedics;  Laterality: Left;    SOCIAL HISTORY: Social History   Socioeconomic History   Marital status: Married    Spouse name: Not on file   Number of children: Not on file   Years of education: Not on file   Highest education level: Not on file  Occupational History   Not on file  Tobacco Use   Smoking status: Former    Current packs/day: 0.00    Average packs/day: 0.5 packs/day for 6.0 years (3.0 ttl pk-yrs)    Types: Cigarettes    Start date: 12    Quit date: 46    Years since quitting: 42.8   Smokeless tobacco: Never   Tobacco comments:    Pt states when Christopher Marsh did smoke Christopher Marsh smoked at most 1 ppd. ALS 09/26/2022  Vaping Use   Vaping status: Never Used  Substance and Sexual Activity   Alcohol use: Not Currently    Comment: occ   Drug use: No   Sexual activity: Not on file  Other Topics Concern   Not on file  Social History Narrative   Not on file   Social Determinants of Health   Financial Resource Strain: Low Risk  (08/27/2022)   Overall Financial Resource Strain (CARDIA)    Difficulty of Paying Living Expenses: Not very hard  Food Insecurity: No Food Insecurity (02/18/2023)   Hunger Vital Sign    Worried About Running Out of Food in the Last Year: Never true    Ran Out of Food in the Last Year: Never true  Transportation Needs: No Transportation Needs (02/18/2023)   PRAPARE - Administrator, Civil Service (Medical): No    Lack of Transportation (Non-Medical): No  Physical Activity: Insufficiently Active (08/27/2022)   Exercise Vital Sign    Days of Exercise per Week: 3 days    Minutes of Exercise per Session: 30 min   Stress: Stress Concern Present (08/27/2022)   [employer] of Occupational Health - Occupational Stress Questionnaire    Feeling of Stress : Rather much  Social Connections: Socially Integrated (08/27/2022)   Social Connection and Isolation Panel [NHANES]    Frequency of Communication with Friends and Family: More than three times a week    Frequency of Social Gatherings with Friends and Family: More than three times a week    Attends Religious Services: 1 to 4 times per year    Active Member of [employer] or Organizations: Yes    Attends Banker Meetings: Never    Marital Status: Married  Catering manager Violence: Not  At Risk (02/14/2023)   Humiliation, Afraid, Rape, and Kick questionnaire    Fear of Current or Ex-Partner: No    Emotionally Abused: No    Physically Abused: No    Sexually Abused: No    FAMILY HISTORY: Family History  Problem Relation Age of Onset   Breast cancer Mother    Emphysema Father    Colon cancer Maternal Grandmother     ALLERGIES:  has No Known Allergies.  MEDICATIONS:  Current Outpatient Medications  Medication Sig Dispense Refill   albuterol (VENTOLIN HFA) 108 (90 Base) MCG/ACT inhaler TAKE 2 PUFFS BY MOUTH EVERY 4 HOURS AS NEEDED 8.5 each 1   Calcium Carbonate 500 MG CHEW Chew 2 tablets (1,000 mg total) by mouth daily. 60 tablet 0   diphenoxylate-atropine (LOMOTIL) 2.5-0.025 MG tablet Take 1 tablet by mouth 4 (four) times daily as needed for diarrhea or loose stools. 120 tablet 3   fentaNYL (DURAGESIC) 50 MCG/HR Place 1 patch onto the skin every 3 (three) days. 10 patch 0   glipiZIDE (GLUCOTROL) 10 MG tablet TAKE 1 TABLET (10 MG TOTAL) BY MOUTH TWICE A DAY BEFORE A MEAL 180 tablet 3   glucose blood test strip 1 each by Other route as needed for other. accu chek aviva plusUse as instructed     ketoconazole (NIZORAL) 2 % cream Apply 1 Application topically 2 (two) times daily as needed for irritation. 60 g 5   Lactulose 20 GM/30ML SOLN  Take 30 mLs (20 g total) by mouth daily as needed (constipation.). 450 mL 0   lidocaine-prilocaine (EMLA) cream Apply 1 Application topically as needed. 30 g 5   LORazepam (ATIVAN) 0.5 MG tablet TAKE 1 TABLET (0.5 MG TOTAL) BY MOUTH EVERY 8 (EIGHT) HOURS AS NEEDED FOR ANXIETY (NAUSEA VOMITING). 60 tablet 0   megestrol (MEGACE) 40 MG tablet Take 2 tablets (80 mg total) by mouth 2 (two) times daily. 120 tablet 0   metFORMIN (GLUCOPHAGE) 500 MG tablet TAKE TWO TABLETS BY MOUTH EVERY MORNING WITH BREAKFAST 180 tablet 1   MUCINEX D MAX STRENGTH [phone] MG TB12 Take 1,200 mg by mouth 2 (two) times daily as needed (for congestion or to loosen mucous in the chest).     ondansetron (ZOFRAN) 4 MG tablet Take by mouth.     oxyCODONE (OXY IR/ROXICODONE) 5 MG immediate release tablet Take 1 tablet (5 mg total) by mouth every 4 (four) hours as needed for severe pain. 60 tablet 0   phytonadione (MEPHYTON) 5 MG tablet TAKE 1/2 TABLET (2.5 MG) BY MOUTH ONLY IF ADVISED BY ANTICOAGULATION CLINIC TO TAKE 1 tablet 0   potassium chloride (KLOR-CON) 10 MEQ tablet Take 1 tablet (10 mEq total) by mouth daily. 90 tablet 3   potassium chloride SA (KLOR-CON M) 20 MEQ tablet Take 1 tablet (20 mEq total) by mouth 2 (two) times daily. 10 tablet 0   pravastatin (PRAVACHOL) 20 MG tablet TAKE 1 TABLET BY MOUTH EVERY DAY 90 tablet 3   prochlorperazine (COMPAZINE) 10 MG tablet Take 10 mg by mouth every 6 (six) hours as needed.     silver sulfADIAZINE (SILVADENE) 1 % cream Apply 1 Application topically daily. 50 g 0   SLOW-MAG 71.5-119 MG TBEC SR tablet TAKE 1 TABLET (64 MG TOTAL) BY MOUTH DAILY 60 tablet 1   tadalafil (CIALIS) 20 MG tablet Take 0.5-1 tablets (10-20 mg total) by mouth every other day as needed for erectile dysfunction. 20 tablet 11   torsemide (DEMADEX) 20 MG tablet Take  1 tablet (20 mg total) by mouth 2 (two) times daily as needed. 180 tablet 3   traZODone (DESYREL) 50 MG tablet TAKE 1 TABLET BY MOUTH AT BEDTIME AS  NEEDED FOR SLEEP. 90 tablet 0   triamcinolone cream (KENALOG) 0.1 % Apply 1 Application topically 3 (three) times daily. 80 g 5   warfarin (COUMADIN) 5 MG tablet Take 1 tablet (5 mg total) by mouth daily. 30 tablet 5   metoprolol tartrate (LOPRESSOR) 50 MG tablet Take 1 tablet (50 mg total) by mouth 2 (two) times daily. 180 tablet 3   No current facility-administered medications for this visit.   Facility-Administered Medications Ordered in Other Visits  Medication Dose Route Frequency Provider Last Rate Last Admin   sodium chloride flush (NS) 0.9 % injection 10 mL  10 mL Intravenous PRN [name], MD   10 mL at 07/18/23 0951     PHYSICAL EXAMINATION: ECOG PERFORMANCE STATUS: 1 - Symptomatic but completely ambulatory Vitals:   09/09/23 0909  BP: (!) 92/52  Pulse: 82  Resp: 18  Temp: 99.2 F (37.3 C)  SpO2: 100%   Filed Weights   09/09/23 0909  Weight: 226 lb 3.2 oz (102.6 kg)    Physical Exam Constitutional:      General: Christopher Marsh is not in acute distress. HENT:     Head: Normocephalic and atraumatic.  Eyes:     General: No scleral icterus. Cardiovascular:     Rate and Rhythm: Normal rate.  Pulmonary:     Effort: Pulmonary effort is normal. No respiratory distress.     Breath sounds: No wheezing.  Abdominal:     General: Bowel sounds are normal. There is no distension.  Musculoskeletal:        General: Normal range of motion.     Cervical back: Normal range of motion and neck supple.     Right lower leg: Edema present.     Left lower leg: Edema present.  Skin:    General: Skin is warm and dry.     Findings: No erythema.  Neurological:     Mental Status: Christopher Marsh is alert and oriented to person, place, and time. Mental status is at baseline.  Psychiatric:        Mood and Affect: Mood normal.     LABORATORY DATA:  I have reviewed the data as listed    Latest Ref Rng & Units 09/09/2023    8:43 AM 08/26/2023    8:07 AM 08/07/2023    7:54 AM  CBC  WBC 4.0 - 10.5 K/uL  5.1  3.7  4.2   Hemoglobin 13.0 - 17.0 g/dL 8.5  9.6  16.1   Hematocrit 39.0 - 52.0 % 26.2  29.9  31.9   Platelets 150 - 400 K/uL 119  92  130       Latest Ref Rng & Units 09/09/2023    8:43 AM 08/26/2023    8:07 AM 08/07/2023    7:54 AM  CMP  Glucose 70 - 99 mg/dL 096  045  91   BUN 8 - 23 mg/dL 11  10  8    Creatinine 0.61 - 1.24 mg/dL 4.09  8.11  9.14   Sodium 135 - 145 mmol/L 132  134  135   Potassium 3.5 - 5.1 mmol/L 2.7  3.4  3.7   Chloride 98 - 111 mmol/L 98  103  105   CO2 22 - 32 mmol/L 24  25  25    Calcium 8.9 - 10.3  mg/dL 7.9  8.0  8.7   Total Protein 6.5 - 8.1 g/dL 5.6  5.5  5.8   Total Bilirubin <1.2 mg/dL 1.4  1.3  1.1   Alkaline Phos 38 - 126 U/L 97  108  100   AST 15 - 41 U/L 22  23  31    ALT 0 - 44 U/L 13  16  16      RADIOGRAPHIC STUDIES: I have personally reviewed the radiological images as listed and agreed with the findings in the report. No results found.

## 2023-09-09 NOTE — Progress Notes (Signed)
Pt had supratherapeutic INR, 10.7, on 11/7, and was advised to hold all warfarin until recheck today. Pt was also prescribed vitamin K tablet, 2.5 mg, which pt took on 11/7. Could have been caused by increased BLE. Pt denied any bleeding.  Oncology drew lab INR at pt's apt today. INR is 2.3 Pt reports at his oncology apt today the oncologist was discussing changing anticoagulant to Eliquis but she did not change it today. Pt is requesting a msg be sent to oncologist also to let them know of INR today and plan of care going forward. Advised a msg would be sent and if any changes are made the coumadin clinic or oncology would contact him.  Will restart warfarin at 2.5 mg (1/2 tablet) for the next three days starting today and recheck INR on 11/14 at [facility] coumadin clinic. Pt read back instructions and verbalized understanding.  Sent msg to oncologist.

## 2023-09-09 NOTE — Telephone Encounter (Signed)
Critical Lab received from [facility].   Potassium 2.7  MD notified.

## 2023-09-09 NOTE — Progress Notes (Signed)
Palliative Medicine [facility] at [facility] Telephone:([phone] [phone]) [phone]   Name: Christopher Marsh Date: 09/09/2023 MRN: [id number]  DOB: [birth year]  Patient Care Team: Nelwyn Salisbury, MD as PCP - General (Family Medicine) Mariah Milling Tollie Pizza, MD as PCP - Cardiology (Cardiology) [name], MD as Consulting Physician (Hematology and Oncology) Georgiana Spinner, NP as Nurse Practitioner (Vascular Surgery) Benita Gutter, RN as Oncology Nurse Navigator Carmina Miller, MD as Consulting Physician (Radiation Oncology)    REASON FOR CONSULTATION: Christopher Marsh is a 62 y.o. male with multiple medical problems including stage IV cholangiocarcinoma.  Patient has had disease progression on multiple previous lines of treatment.  He is referred to palliative care to address goals and manage ongoing symptoms.  SOCIAL HISTORY:     reports that he quit smoking about 42 years ago. His smoking use included cigarettes. He started smoking about 48 years ago. He has a 3 pack-year smoking history. He has never used smokeless tobacco. He reports that he does not currently use alcohol. He reports that he does not use drugs.  Patient is married and lives at home with his wife.  ADVANCE DIRECTIVES:    CODE STATUS: DNR/DNI (MOST form completed on 09/09/2023)  PAST MEDICAL HISTORY: Past Medical History:  Diagnosis Date   Antiphospholipid antibody syndrome (HCC)    Arthritis    Cancer (HCC)    Diabetes mellitus without complication (HCC)    DVT (deep venous thrombosis) (HCC)    Lt leg   Dyspnea    Elevated lipids    Erythrocytosis    Hyperlipidemia    Hypertension    Lower extremity edema    Multinodular thyroid    Post-thrombotic syndrome     PAST SURGICAL HISTORY:  Past Surgical History:  Procedure Laterality Date   CARDIOVERSION N/A 03/22/2023   Procedure: CARDIOVERSION;  Surgeon: Antonieta Iba, MD;  Location: [facility] ORS;  Service:  Cardiovascular;  Laterality: N/A;   COLONOSCOPY WITH PROPOFOL N/A 09/23/2018   Per Dr. Norma Fredrickson, polyp, repeat in 3 yrs    EYE MUSCLE SURGERY     JOINT REPLACEMENT Left 07/22/2017   PORTA CATH INSERTION N/A 12/24/2022   Procedure: PORTA CATH INSERTION;  Surgeon: Annice Needy, MD;  Location: [facility] INVASIVE CV LAB;  Service: Cardiovascular;  Laterality: N/A;   TOTAL HIP ARTHROPLASTY Left 07/22/2017   Procedure: TOTAL HIP ARTHROPLASTY;  Surgeon: Donato Heinz, MD;  Location: [facility] ORS;  Service: Orthopedics;  Laterality: Left;    HEMATOLOGY/ONCOLOGY HISTORY:  Oncology History  Cholangiocarcinoma (HCC)  10/30/2021 Imaging   CT chest wo contrast 1. Heterogeneous bilateral ground-glass disease, multilobar but worst in the right middle lobe, lingula and lower lobes. There is progression of the abnormality since 10/12/2022. Though pneumonia could produce this appearance, given persistence and fluctuating appearance since October, consider alternative entities such as hypersensitivity pneumonitis or idiopathic interstitial pneumonia. 2. Slightly enlarged pulmonary trunk, possible arterial hypertension 3. Redemonstrated large hypodense right hepatic lobe mass.   08/18/2022 Imaging   CT chest w contrast showed 1. Multifocal airspace disease in both lungs, left side greater than right. Findings are most compatible with multifocal pneumonia. 2. Multiple new hepatic lesions with enlarging lymph nodes in the upper abdomen and chest. Findings are concerning for metastatic disease. Based on the multifocal pneumonia, hepatic abscesses would also be in the differential diagnosis. Recommend further characterization of the abdomen and pelvis with CT with IV contrast. 3. Enlargement of the main pulmonary  artery could be associated with pulmonary hypertension. 4. Coronary artery calcifications. 5. Multinodular goiter. Patient has known thyroid nodules and previous thyroid ultrasound.    08/18/2022 Imaging   CT  abdomen pelvis w contrast  Interval development of 6.5 x 5.2 cm heterogeneously enhancing mass in right hepatic lobe. This is highly concerning for neoplasm or malignancy, and further evaluation with MRI is recommended.   Mildly enlarged periaortic adenopathy is noted, with the largest lymph node measuring 12 mm. Metastatic disease cannot be excluded. Mildly enlarged adenopathy is also noted in the gastrohepatic ligament, but this is unchanged compared to prior exam.   Stable bibasilar lung opacities are noted concerning for inflammation, atelectasis or possibly scarring.   Grossly stable multi-septated cystic lesion is seen involving upper pole of right kidney with peripheral calcifications compared to prior exam of 2019, most likely representing benign etiology.   Aortic Atherosclerosis    08/18/2022 - 08/21/2022 Hospital Admission   Admitted due to multifocal pneumonia, treated with Influenza panel negative, COVID-negative, HIV negative, Complete course of antibiotics with Zithromax, Vantin     08/19/2022 Imaging   MR abdomen w wo contrast  Marked caudate lobe hypertrophy, highly suspicious for hepatic cirrhosis.   Numerous hypervascular masses throughout the right hepatic lobe, highly suspicious for multifocal hepatocellular carcinoma. Recommend correlation with AFP and consider tissue sampling.   Mild abdominal lymphadenopathy, with differential diagnosis including metastatic disease and reactive lymphadenopathy in setting of cirrhosis.   Bilateral lower lobe infiltrates, as better demonstrated on recent CT.    08/19/2022 Tumor Marker   AFP 4.1   08/27/2022 Initial Diagnosis   Cholangiocarcinoma  -08/20/22 Liver mass biopsy showed poorly differentiated carcinoma Immunohistochemical stains show that the poorly differentiated carcinoma is positive for CK7 and MOC-31, suggestive of a poorly differentiated adenocarcinoma.  Immunohistochemical stains for CK20, CDX2, HepPar 1 and  arginase are negative.  Immunostain for glypican-3 shows very focal  likely nonspecific labeling.  This immunoprofile is nonspecific but can be compatible with a poorly differentiated primary cholangiocarcinoma in absence of any other lesions.   NGS showed BAP 1 mutation.  Case was presented at tumor board, I have also discussed with pathologist Dr.Rubinas,  IHC pattern is more consistent with adenocarcinoma, unlikely HCC [due to negative HepPar 1 and arginase], cholangiocarcinoma is favored, although this is a diagnosis of exclusion. Second opinion ar [facility]   08/27/2022 Cancer Staging   Staging form: Intrahepatic Bile Duct, AJCC 8th Edition - Clinical stage from 08/27/2022: Stage IV (cT2, cN1, cM1) - Signed by [name], MD on 09/13/2022 Stage prefix: Initial diagnosis   08/27/2022 Tumor Marker   CA 19.9  16 CEA 2.8   09/12/2022 Imaging   PET scan showed 1. Large right hepatic lobe mass is hypermetabolic and consistent with known cholangiocarcinoma. 2. Scattered borderline enlarged upper abdominal lymph nodes do not show any significant FDG uptake. 3. 4 hypermetabolic bone lesions consistent with metastatic disease. 4. Progressive diffuse airspace process in the left lung could be due to atypical pneumonia, pulmonary hemorrhage or left-sided aspiration. 5. Aortic atherosclerosis.  09/12/2022 I had a phone discussion with patient after his PET scan resulted.  Recommend systemic chemotherapy.  Patient would like to defer until his second opinion visit at [facility].   09/15/2022 Imaging   CT angio chest pulmonary embolism protocol showed 1. No evidence for pulmonary embolism. 2. Progression of bilateral multifocal patchy ground-glass and interstitial opacities, left greater than right. Findings are concerning for multifocal pneumonia. 3. Air-fluid levels in the left lower  lobe bronchi worrisome for aspiration. 4. Mediastinal and hilar lymphadenopathy has increased in size and number when  compared to the prior study, likely reactive.5. Cardiomegaly. 6. Findings compatible with pulmonary artery hypertension.7. Stable right liver mass and complex right renal cystic lesion. 8. Stable enlargement of the left thyroid.   09/15/2022 - 09/17/2022 Hospital Admission   Patient was admitted due to shortness of breath, CT showed progression of bilateral multifocal patchy groundglass and interstitial opacities.  Left greater than right.  Concerning for multifocal pneumonia.  Patient was started on broad-spectrum antibiotics with significant improvement.  He was discharged on oral antibiotics.   09/19/2022 Miscellaneous   Patient went to [facility] for second opinion.  He was seen by Shirlee Latch, who felt that the presentation is consistent with metastatic cholangiocarcinoma, and agrees with the plan of cisplatin/gemcitabine/durvalumab..    10/11/2022 - 01/31/2023 Chemotherapy   Patient is on Treatment Plan : Cisplatin D1,8 + Gemcitabine + Durvalumab  D1,8 q21d x     10/12/2022 Imaging   CT chest w contrast 1. Bilateral airspace and ground-glass opacities have mildly decreased in amount and density when compared to the prior study. Findings are compatible with resolving multifocal pneumonia. Continued follow-up imaging recommended. 2. Stable enlarged heterogeneous left thyroid gland with 3.2 cm nodule. This can be further evaluated with dedicated ultrasound. 3. Stable hepatic and right renal lesions, incompletely evaluated.   Aortic Atherosclerosis (ICD10-I70.0).   12/11/2022 Imaging   PET showed  1. There is been mild increase in size of the tracer avid mass within right lobe of liver compatible with known cholangiocarcinoma. The degree of tracer uptake however is slightly decreased in the interval. 2. Multifocal tracer avid bone metastases are again noted. When compared with the previous exam there has been an mixed interval response to therapy. Specifically, there has been interval increase in  size and FDG uptake associated with the right ischial tuberosity lesion and there is a new lesion within the posterior left iliac bone. Decreased tracer uptake is associated with the left scapular lesion and there is stable tracer uptake associated with the right T1 transverse process lesion. 3. Persistent ground-glass and airspace densities within the left upper lobe and left lower lobe compatible with inflammatory or infectious process. Interval resolution of previous right lung ground-glass and airspace densities.    12/24/2022 Procedure   Medi port placement by Dr. Wyn Quaker   01/22/2023 Imaging   CT chest abdomen pelvis with contrast 1. Hypodense lesion of the right lobe of the liver, hepatic segment VII/VIII is slightly diminished in size, consistent with treatment response. 2. No evident change in additional smaller hypodense lesions scattered throughout the right lobe of the liver, these more difficult to see on prior noncontrast PET-CT. 3. Unchanged enlarged celiac axis, gastrohepatic ligament, and retroperitoneal lymph nodes. Unchanged prominent high right paratracheal lymph nodes. 4. Unchanged osseous metastases. 5. Slightly improved, although still extensive ground-glass airspace opacity primarily seen throughout the left lower lobe and lingula,nonspecific and infectious or inflammatory. 6. Coronary artery disease. 7. Emphysema.   02/11/2023 Imaging   Patient self decreased Lovenox dosage to 120mg  once daily.  Increased lower extremity swelling.   02/11/23 [place] bilateral lower extremity showed 1. Nonocclusive DVT within the popliteal veins bilaterally. 2. Occlusive DVT within the posterior tibial veins of the right calf.       02/13/2023 Imaging   CT chest Angiogram  1. Technically adequate exam showing no acute pulmonary embolus. 2. Cardiomegaly. Coronary artery calcifications. 3. Bilateral pleural effusions. 4. Improved  aeration of the RIGHT UPPER and LOWER lobes compared to prior  study. 5. LEFT LOWER lobe and lingula airspace filling and atelectasis, slightly improved compared to prior study. 6. Cirrhotic morphology of the liver. 7.7 centimeters mass in the RIGHT hepatic lobe. 7. Gastrohepatic ligament adenopathy. 8. A 7 millimeters sclerotic lesion in the UPPER sternum is suspicious for metastatic disease. 9. LEFT thyroid goiter.   02/13/2023 - 02/15/2023 Hospital Admission   A flutter with variable A-V block. Heart rate up to 140.  BNP 119.8 04/18: Echo no overt CHF - EF 50-55  Cardiology recs beta blocker to metoprolol succinate 50 mg bid, Torsemid 40mg  daily Adjusting Lovenox 14mg  BID.    03/03/2023 - 05/03/2023 Chemotherapy   Futibatinib 20mg , 03/12/23 Decreased to 16mg  daily Stopped in early July due to cancer progression.   04/24/2023 - 04/30/2023 Radiation Therapy   left ischium radiation.    05/20/2023 -  Chemotherapy   Patient is on Treatment Plan : COLORECTAL FOLFOX q14d       ALLERGIES:  has No Known Allergies.  MEDICATIONS:  Current Outpatient Medications  Medication Sig Dispense Refill   albuterol (VENTOLIN HFA) 108 (90 Base) MCG/ACT inhaler TAKE 2 PUFFS BY MOUTH EVERY 4 HOURS AS NEEDED 8.5 each 1   Calcium Carbonate 500 MG CHEW Chew 2 tablets (1,000 mg total) by mouth daily. 60 tablet 0   diphenoxylate-atropine (LOMOTIL) 2.5-0.025 MG tablet Take 1 tablet by mouth 4 (four) times daily as needed for diarrhea or loose stools. 120 tablet 3   fentaNYL (DURAGESIC) 50 MCG/HR Place 1 patch onto the skin every 3 (three) days. 10 patch 0   glipiZIDE (GLUCOTROL) 10 MG tablet TAKE 1 TABLET (10 MG TOTAL) BY MOUTH TWICE A DAY BEFORE A MEAL 180 tablet 3   glucose blood test strip 1 each by Other route as needed for other. accu chek aviva plusUse as instructed     ketoconazole (NIZORAL) 2 % cream Apply 1 Application topically 2 (two) times daily as needed for irritation. 60 g 5   Lactulose 20 GM/30ML SOLN Take 30 mLs (20 g total) by mouth daily as needed  (constipation.). 450 mL 0   lidocaine-prilocaine (EMLA) cream Apply 1 Application topically as needed. 30 g 5   LORazepam (ATIVAN) 0.5 MG tablet TAKE 1 TABLET (0.5 MG TOTAL) BY MOUTH EVERY 8 (EIGHT) HOURS AS NEEDED FOR ANXIETY (NAUSEA VOMITING). 60 tablet 0   megestrol (MEGACE) 40 MG tablet Take 2 tablets (80 mg total) by mouth 2 (two) times daily. 120 tablet 0   metFORMIN (GLUCOPHAGE) 500 MG tablet TAKE TWO TABLETS BY MOUTH EVERY MORNING WITH BREAKFAST 180 tablet 1   metoprolol tartrate (LOPRESSOR) 50 MG tablet Take 1 tablet (50 mg total) by mouth 2 (two) times daily. 180 tablet 3   MUCINEX D MAX STRENGTH 318 021 6926 MG TB12 Take 1,200 mg by mouth 2 (two) times daily as needed (for congestion or to loosen mucous in the chest).     ondansetron (ZOFRAN) 4 MG tablet Take by mouth.     oxyCODONE (OXY IR/ROXICODONE) 5 MG immediate release tablet Take 1 tablet (5 mg total) by mouth every 4 (four) hours as needed for severe pain. 60 tablet 0   phytonadione (MEPHYTON) 5 MG tablet TAKE 1/2 TABLET (2.5 MG) BY MOUTH ONLY IF ADVISED BY ANTICOAGULATION CLINIC TO TAKE 1 tablet 0   potassium chloride (KLOR-CON) 10 MEQ tablet Take 1 tablet (10 mEq total) by mouth daily. 90 tablet 3   pravastatin (PRAVACHOL) 20 MG  tablet TAKE 1 TABLET BY MOUTH EVERY DAY 90 tablet 3   prochlorperazine (COMPAZINE) 10 MG tablet Take 10 mg by mouth every 6 (six) hours as needed.     silver sulfADIAZINE (SILVADENE) 1 % cream Apply 1 Application topically daily. 50 g 0   SLOW-MAG 71.5-119 MG TBEC SR tablet TAKE 1 TABLET (64 MG TOTAL) BY MOUTH DAILY 60 tablet 1   tadalafil (CIALIS) 20 MG tablet Take 0.5-1 tablets (10-20 mg total) by mouth every other day as needed for erectile dysfunction. 20 tablet 11   torsemide (DEMADEX) 20 MG tablet Take 1 tablet (20 mg total) by mouth 2 (two) times daily as needed. 180 tablet 3   traZODone (DESYREL) 50 MG tablet TAKE 1 TABLET BY MOUTH AT BEDTIME AS NEEDED FOR SLEEP. 90 tablet 0   triamcinolone cream  (KENALOG) 0.1 % Apply 1 Application topically 3 (three) times daily. 80 g 5   warfarin (COUMADIN) 5 MG tablet Take 1 tablet (5 mg total) by mouth daily. 30 tablet 5   No current facility-administered medications for this visit.   Facility-Administered Medications Ordered in Other Visits  Medication Dose Route Frequency Provider Last Rate Last Admin   sodium chloride flush (NS) 0.9 % injection 10 mL  10 mL Intravenous PRN [name], MD   10 mL at 07/18/23 0951    VITAL SIGNS: There were no vitals taken for this visit. There were no vitals filed for this visit.   Estimated body mass index is 28.27 kg/m as calculated from the following:   Height as of 07/10/23: 6\' 3"  (1.905 m).   Weight as of an earlier encounter on 09/09/23: 226 lb 3.2 oz (102.6 kg).  LABS: CBC:    Component Value Date/Time   WBC 5.1 09/09/2023 0843   WBC 2.5 (L) 07/08/2023 0932   HGB 8.5 (L) 09/09/2023 0843   HCT 26.2 (L) 09/09/2023 0843   PLT 119 (L) 09/09/2023 0843   MCV 93.6 09/09/2023 0843   NEUTROABS 4.3 09/09/2023 0843   LYMPHSABS 0.2 (L) 09/09/2023 0843   MONOABS 0.6 09/09/2023 0843   EOSABS 0.0 09/09/2023 0843   BASOSABS 0.0 09/09/2023 0843   Comprehensive Metabolic Panel:    Component Value Date/Time   NA 134 (L) 08/26/2023 0807   K 3.4 (L) 08/26/2023 0807   CL 103 08/26/2023 0807   CO2 25 08/26/2023 0807   BUN 10 08/26/2023 0807   CREATININE 0.74 08/26/2023 0807   GLUCOSE 144 (H) 08/26/2023 0807   CALCIUM 8.0 (L) 08/26/2023 0807   AST 23 08/26/2023 0807   ALT 16 08/26/2023 0807   ALKPHOS 108 08/26/2023 0807   BILITOT 1.3 (H) 08/26/2023 0807   PROT 5.5 (L) 08/26/2023 0807   ALBUMIN 2.6 (L) 08/26/2023 0807    RADIOGRAPHIC STUDIES: No results found.  PERFORMANCE STATUS (ECOG) : 1 - Symptomatic but completely ambulatory  Review of Systems Unless otherwise noted, a complete review of systems is negative.  Physical Exam General: NAD Pulmonary: Unlabored Extremities: no edema, no joint  deformities Skin: no rashes Neurological: Weakness but otherwise nonfocal  IMPRESSION: Follow-up visit.  Patient saw Dr. Cathie Hoops today.  Plan is to repeat CT later this month.  However, patient's performance status is poor manage continue to lose weight.  Unclear if he is a candidate for further treatment.  I met with patient and wife.  They are interested in obtaining the CT but were tearful as they discussed an understanding the patient is approaching end-of-life at some point.  We discussed the future involvement of hospice.  MOST form completed today.  Patient described a desire for DNR/DNI.  He would be okay with hospitalization to treat the treatable if needed.   I completed a MOST form today. The patient and family outlined their wishes for the following treatment decisions:  Cardiopulmonary Resuscitation: Do Not Attempt Resuscitation (DNR/No CPR)  Medical Interventions: Limited Additional Interventions: Use medical treatment, IV fluids and cardiac monitoring as indicated, DO NOT USE intubation or mechanical ventilation. May consider use of less invasive airway support such as BiPAP or CPAP. Also provide comfort measures. Transfer to the hospital if indicated. Avoid intensive care.   Antibiotics: Antibiotics if indicated  IV Fluids: IV fluids if indicated  Feeding Tube: No feeding tube   Patient is interested in referral to [facility].  PLAN: -Continue current scope of treatment -Referral to [facility] Care program -MOST Form completed -DNR/DNI -Follow-up 3 weeks  Case and plan discussed with Dr. Cathie Hoops  Patient expressed understanding and was in agreement with this plan. He also understands that He can call the clinic at any time with any questions, concerns, or complaints.     Time Total: 25 minutes  Visit consisted of counseling and education dealing with the complex and emotionally intense issues of symptom management and palliative care in the setting of serious and potentially  life-threatening illness.Greater than 50%  of this time was spent counseling and coordinating care related to the above assessment and plan.  Signed by: Laurette Schimke, PhD, NP-C

## 2023-09-10 ENCOUNTER — Telehealth: Payer: Self-pay | Admitting: Oncology

## 2023-09-10 ENCOUNTER — Inpatient Hospital Stay
Admission: RE | Admit: 2023-09-10 | Discharge: 2023-09-10 | Disposition: A | Discharge status: Home / Self Care | Payer: BC Managed Care – PPO | Source: Ambulatory Visit | Attending: Oncology

## 2023-09-10 DIAGNOSIS — R188 Other ascites: Secondary | ICD-10-CM | POA: Diagnosis not present

## 2023-09-10 DIAGNOSIS — C221 Intrahepatic bile duct carcinoma: Secondary | ICD-10-CM | POA: Diagnosis not present

## 2023-09-10 DIAGNOSIS — C7951 Secondary malignant neoplasm of bone: Secondary | ICD-10-CM | POA: Diagnosis not present

## 2023-09-10 DIAGNOSIS — R160 Hepatomegaly, not elsewhere classified: Secondary | ICD-10-CM | POA: Diagnosis not present

## 2023-09-10 LAB — CEA: CEA: 8.4 ng/mL — ABNORMAL HIGH (ref 0.0–4.7)

## 2023-09-10 LAB — CANCER ANTIGEN 19-9: CA 19-9: 83 U/mL — ABNORMAL HIGH (ref 0–35)

## 2023-09-10 MED ORDER — SODIUM CHLORIDE 0.9% FLUSH
10.0000 mL | INTRAVENOUS | Status: DC | PRN
  Administered 2023-09-10: 10 mL via INTRAVENOUS

## 2023-09-10 MED ORDER — IOPAMIDOL (ISOVUE-300) INJECTION 61%
80.0000 mL | Freq: Once | INTRAVENOUS | Status: AC | PRN
  Administered 2023-09-10: 80 mL via INTRAVENOUS

## 2023-09-10 MED ORDER — HEPARIN SOD (PORK) LOCK FLUSH 100 UNIT/ML IV SOLN
500.0000 [IU] | Freq: Once | INTRAVENOUS | Status: AC
  Administered 2023-09-10: 500 [IU] via INTRAVENOUS

## 2023-09-10 NOTE — Telephone Encounter (Signed)
Pt called and has appt tomorrow morning with Joli and wants to know if he can do a virtual visit instead. Please let pt know.  Thank you

## 2023-09-10 NOTE — Telephone Encounter (Signed)
Please move appointment tomorrow to Thursday, 11/14 and I will see patient while he is here for fluids.  Thanks!

## 2023-09-11 ENCOUNTER — Encounter: Payer: BC Managed Care – PPO | Admitting: Physical Therapy

## 2023-09-11 ENCOUNTER — Inpatient Hospital Stay: Payer: BC Managed Care – PPO

## 2023-09-12 ENCOUNTER — Other Ambulatory Visit: Payer: BC Managed Care – PPO

## 2023-09-12 ENCOUNTER — Inpatient Hospital Stay: Payer: BC Managed Care – PPO

## 2023-09-12 ENCOUNTER — Ambulatory Visit: Payer: BC Managed Care – PPO

## 2023-09-12 DIAGNOSIS — I4891 Unspecified atrial fibrillation: Secondary | ICD-10-CM | POA: Diagnosis not present

## 2023-09-12 DIAGNOSIS — C221 Intrahepatic bile duct carcinoma: Secondary | ICD-10-CM

## 2023-09-12 DIAGNOSIS — Z452 Encounter for adjustment and management of vascular access device: Secondary | ICD-10-CM | POA: Diagnosis not present

## 2023-09-12 DIAGNOSIS — Z5111 Encounter for antineoplastic chemotherapy: Secondary | ICD-10-CM | POA: Diagnosis not present

## 2023-09-12 DIAGNOSIS — I959 Hypotension, unspecified: Secondary | ICD-10-CM | POA: Diagnosis not present

## 2023-09-12 DIAGNOSIS — E042 Nontoxic multinodular goiter: Secondary | ICD-10-CM | POA: Diagnosis not present

## 2023-09-12 DIAGNOSIS — C7951 Secondary malignant neoplasm of bone: Secondary | ICD-10-CM | POA: Diagnosis not present

## 2023-09-12 DIAGNOSIS — Z7901 Long term (current) use of anticoagulants: Secondary | ICD-10-CM | POA: Diagnosis not present

## 2023-09-12 DIAGNOSIS — E876 Hypokalemia: Secondary | ICD-10-CM | POA: Diagnosis not present

## 2023-09-12 DIAGNOSIS — Z515 Encounter for palliative care: Secondary | ICD-10-CM | POA: Diagnosis not present

## 2023-09-12 DIAGNOSIS — D6861 Antiphospholipid syndrome: Secondary | ICD-10-CM | POA: Diagnosis not present

## 2023-09-12 DIAGNOSIS — Z86718 Personal history of other venous thrombosis and embolism: Secondary | ICD-10-CM | POA: Diagnosis not present

## 2023-09-12 LAB — CMP (CANCER CENTER ONLY)
ALT: 12 U/L (ref 0–44)
AST: 25 U/L (ref 15–41)
Albumin: 2.4 g/dL — ABNORMAL LOW (ref 3.5–5.0)
Alkaline Phosphatase: 99 U/L (ref 38–126)
Anion gap: 9 (ref 5–15)
BUN: 8 mg/dL (ref 8–23)
CO2: 23 mmol/L (ref 22–32)
Calcium: 8.3 mg/dL — ABNORMAL LOW (ref 8.9–10.3)
Chloride: 101 mmol/L (ref 98–111)
Creatinine: 0.77 mg/dL (ref 0.61–1.24)
GFR, Estimated: >60 mL/min (ref >60)
Glucose, Bld: 127 mg/dL — ABNORMAL HIGH (ref 70–99)
Potassium: 3.5 mmol/L (ref 3.5–5.1)
Sodium: 133 mmol/L — ABNORMAL LOW (ref 135–145)
Total Bilirubin: 1.1 mg/dL (ref <1.2)
Total Protein: 5.9 g/dL — ABNORMAL LOW (ref 6.5–8.1)

## 2023-09-12 LAB — PROTIME-INR
INR: 2.5 — ABNORMAL HIGH (ref 0.8–1.2)
Prothrombin Time: 27.5 s — ABNORMAL HIGH (ref 11.4–15.2)

## 2023-09-12 MED ORDER — SODIUM CHLORIDE 0.9% FLUSH
10.0000 mL | Freq: Once | INTRAVENOUS | Status: AC
  Administered 2023-09-12: 10 mL via INTRAVENOUS
  Filled 2023-09-12: qty 10

## 2023-09-12 MED ORDER — HEPARIN SOD (PORK) LOCK FLUSH 100 UNIT/ML IV SOLN
500.0000 [IU] | Freq: Once | INTRAVENOUS | Status: AC
  Administered 2023-09-12: 500 [IU] via INTRAVENOUS
  Filled 2023-09-12: qty 5

## 2023-09-12 NOTE — Progress Notes (Signed)
K 3.5 today. NO IVF/K needed today

## 2023-09-12 NOTE — Progress Notes (Signed)
Nutrition Follow-up:  Patient with metastatic cholangiocarcinoma.  Holding 3rd line treatment of folfox due to weight loss and fatigue.    Met with patient.  Reports that he is waiting to here about results of CT today and making decision about hospice care or continuing treatment.  Reports that he is taking megace.  Appetite has improved some.  Taste is still off.  Has been drinking 1 ensure a day, eating halo oranges and protein bars. Having trouble with foods feeling "heavy" in stomach after eating them.  Some foods are too sweet (ie banana pudding).      Medications: megace  Labs: reviewed  Anthropometrics:   Weight 226 lb 3.2 oz on 11/11  239 lb 11.2 oz on 10/9 243 lb on 9/25 238 lb on 9/19 258 lb on 7/29   NUTRITION DIAGNOSIS: Unintentional weight loss continues   INTERVENTION:  Discussed option of trying orange sherbet Continue oral nutrition supplement as able Eat as able RD available if needed      NEXT VISIT: as needed  Kippy Melena B. Freida Busman, RD, LDN Registered Dietitian [phone]

## 2023-09-13 ENCOUNTER — Ambulatory Visit: Payer: BC Managed Care – PPO

## 2023-09-13 ENCOUNTER — Encounter: Payer: Self-pay | Admitting: Pulmonary Disease

## 2023-09-13 ENCOUNTER — Other Ambulatory Visit: Payer: BC Managed Care – PPO

## 2023-09-13 MED ORDER — AMOXICILLIN-POT CLAVULANATE 875-125 MG PO TABS: 1.0000 | ORAL_TABLET | Freq: Two times a day (BID) | ORAL | 0 refills | Status: DC

## 2023-09-13 NOTE — Telephone Encounter (Signed)
Please see mychart sent by pt.

## 2023-09-13 NOTE — Telephone Encounter (Signed)
Prescription for Augmentin sent.  MyChart message sent.  Spoke with him on the phone regarding recommendations as well.

## 2023-09-16 ENCOUNTER — Encounter: Payer: BC Managed Care – PPO | Admitting: Physical Therapy

## 2023-09-16 ENCOUNTER — Other Ambulatory Visit: Payer: Self-pay

## 2023-09-16 ENCOUNTER — Other Ambulatory Visit: Payer: Self-pay | Admitting: Oncology

## 2023-09-16 ENCOUNTER — Inpatient Hospital Stay: Payer: BC Managed Care – PPO

## 2023-09-16 DIAGNOSIS — C221 Intrahepatic bile duct carcinoma: Secondary | ICD-10-CM

## 2023-09-16 DIAGNOSIS — D6861 Antiphospholipid syndrome: Secondary | ICD-10-CM

## 2023-09-16 DIAGNOSIS — Z7901 Long term (current) use of anticoagulants: Secondary | ICD-10-CM | POA: Diagnosis not present

## 2023-09-16 DIAGNOSIS — Z86718 Personal history of other venous thrombosis and embolism: Secondary | ICD-10-CM | POA: Diagnosis not present

## 2023-09-16 DIAGNOSIS — E876 Hypokalemia: Secondary | ICD-10-CM | POA: Diagnosis not present

## 2023-09-16 DIAGNOSIS — Z452 Encounter for adjustment and management of vascular access device: Secondary | ICD-10-CM | POA: Diagnosis not present

## 2023-09-16 DIAGNOSIS — C7951 Secondary malignant neoplasm of bone: Secondary | ICD-10-CM | POA: Diagnosis not present

## 2023-09-16 DIAGNOSIS — E042 Nontoxic multinodular goiter: Secondary | ICD-10-CM | POA: Diagnosis not present

## 2023-09-16 DIAGNOSIS — Z515 Encounter for palliative care: Secondary | ICD-10-CM | POA: Diagnosis not present

## 2023-09-16 DIAGNOSIS — I4891 Unspecified atrial fibrillation: Secondary | ICD-10-CM | POA: Diagnosis not present

## 2023-09-16 DIAGNOSIS — Z5111 Encounter for antineoplastic chemotherapy: Secondary | ICD-10-CM | POA: Diagnosis not present

## 2023-09-16 DIAGNOSIS — I959 Hypotension, unspecified: Secondary | ICD-10-CM | POA: Diagnosis not present

## 2023-09-16 LAB — PROTIME-INR
INR: 1.3 — ABNORMAL HIGH (ref 0.8–1.2)
Prothrombin Time: 16.5 s — ABNORMAL HIGH (ref 11.4–15.2)

## 2023-09-16 MED ORDER — OXYCODONE HCL 5 MG PO TABS
5.0000 mg | ORAL_TABLET | ORAL | 0 refills | Status: DC | PRN
  Filled 2023-09-16: qty 60, 10d supply, fill #0

## 2023-09-16 MED ORDER — OXYCODONE HCL 5 MG PO TABS
5.0000 mg | ORAL_TABLET | ORAL | 0 refills | Status: DC | PRN
  Filled 2023-09-17: qty 60, 10d supply, fill #0

## 2023-09-17 ENCOUNTER — Other Ambulatory Visit: Payer: Self-pay | Admitting: Oncology

## 2023-09-17 ENCOUNTER — Telehealth: Payer: Self-pay

## 2023-09-17 ENCOUNTER — Other Ambulatory Visit: Payer: Self-pay

## 2023-09-17 DIAGNOSIS — R9389 Abnormal findings on diagnostic imaging of other specified body structures: Secondary | ICD-10-CM

## 2023-09-17 DIAGNOSIS — C7951 Secondary malignant neoplasm of bone: Secondary | ICD-10-CM

## 2023-09-17 MED ORDER — APIXABAN 5 MG PO TABS: 5.0000 mg | ORAL_TABLET | Freq: Two times a day (BID) | ORAL | 1 refills | Status: DC

## 2023-09-17 NOTE — Telephone Encounter (Signed)
INR 1.3. Per MD "advise him to start eliquis 5mg  BID, rx sent". Pt informed of MD recommendation.   Per MD" he has large epidural component of T9, which may benefit from additional radiation. I will repeat MRi thoracic spine w wo contrast per radiology recommendation. Overall his cancer is progressing. " Pt informed. Advised him to keep appt with Dr. Marena Chancy on 11/25.   Please schedule MRi and inform pt of appt.

## 2023-09-18 ENCOUNTER — Ambulatory Visit: Payer: BC Managed Care – PPO

## 2023-09-18 ENCOUNTER — Encounter: Payer: Self-pay | Admitting: Oncology

## 2023-09-18 ENCOUNTER — Encounter: Payer: BC Managed Care – PPO | Admitting: Physical Therapy

## 2023-09-18 ENCOUNTER — Other Ambulatory Visit: Payer: BC Managed Care – PPO

## 2023-09-18 ENCOUNTER — Ambulatory Visit: Payer: BC Managed Care – PPO | Admitting: Oncology

## 2023-09-18 ENCOUNTER — Other Ambulatory Visit: Payer: Self-pay

## 2023-09-20 ENCOUNTER — Other Ambulatory Visit: Payer: Self-pay | Admitting: Oncology

## 2023-09-20 MED ORDER — DEXAMETHASONE 4 MG PO TABS: 8.0000 mg | ORAL_TABLET | Freq: Every day | ORAL | 0 refills | Status: DC

## 2023-09-20 MED ORDER — DEXAMETHASONE 4 MG PO TABS: 4.0000 mg | ORAL_TABLET | Freq: Every day | ORAL | 0 refills | Status: DC

## 2023-09-20 NOTE — Addendum Note (Signed)
Addended by: Coralee Rud on: 09/20/2023 04:41 PM   Modules accepted: Orders

## 2023-09-22 ENCOUNTER — Other Ambulatory Visit: Payer: Self-pay

## 2023-09-22 ENCOUNTER — Inpatient Hospital Stay
Admission: EM | Admit: 2023-09-22 | Discharge: 2023-09-24 | DRG: 065 | Disposition: A | Discharge status: Hospice | Payer: BC Managed Care – PPO | Attending: Internal Medicine | Admitting: Internal Medicine

## 2023-09-22 ENCOUNTER — Other Ambulatory Visit: Payer: Self-pay | Admitting: Oncology

## 2023-09-22 ENCOUNTER — Emergency Department: Payer: BC Managed Care – PPO

## 2023-09-22 ENCOUNTER — Observation Stay: Payer: BC Managed Care – PPO

## 2023-09-22 DIAGNOSIS — I483 Typical atrial flutter: Secondary | ICD-10-CM | POA: Diagnosis not present

## 2023-09-22 DIAGNOSIS — R29810 Facial weakness: Secondary | ICD-10-CM | POA: Diagnosis present

## 2023-09-22 DIAGNOSIS — D6861 Antiphospholipid syndrome: Secondary | ICD-10-CM | POA: Diagnosis not present

## 2023-09-22 DIAGNOSIS — C221 Intrahepatic bile duct carcinoma: Secondary | ICD-10-CM | POA: Diagnosis not present

## 2023-09-22 DIAGNOSIS — I63511 Cerebral infarction due to unspecified occlusion or stenosis of right middle cerebral artery: Secondary | ICD-10-CM | POA: Diagnosis not present

## 2023-09-22 DIAGNOSIS — R188 Other ascites: Secondary | ICD-10-CM | POA: Diagnosis not present

## 2023-09-22 DIAGNOSIS — Z794 Long term (current) use of insulin: Secondary | ICD-10-CM | POA: Diagnosis not present

## 2023-09-22 DIAGNOSIS — I6381 Other cerebral infarction due to occlusion or stenosis of small artery: Secondary | ICD-10-CM | POA: Diagnosis present

## 2023-09-22 DIAGNOSIS — Z66 Do not resuscitate: Secondary | ICD-10-CM | POA: Diagnosis not present

## 2023-09-22 DIAGNOSIS — Z79891 Long term (current) use of opiate analgesic: Secondary | ICD-10-CM

## 2023-09-22 DIAGNOSIS — G8194 Hemiplegia, unspecified affecting left nondominant side: Secondary | ICD-10-CM | POA: Diagnosis not present

## 2023-09-22 DIAGNOSIS — D696 Thrombocytopenia, unspecified: Secondary | ICD-10-CM | POA: Diagnosis not present

## 2023-09-22 DIAGNOSIS — Z825 Family history of asthma and other chronic lower respiratory diseases: Secondary | ICD-10-CM

## 2023-09-22 DIAGNOSIS — E785 Hyperlipidemia, unspecified: Secondary | ICD-10-CM | POA: Diagnosis present

## 2023-09-22 DIAGNOSIS — E1169 Type 2 diabetes mellitus with other specified complication: Secondary | ICD-10-CM | POA: Diagnosis present

## 2023-09-22 DIAGNOSIS — R1312 Dysphagia, oropharyngeal phase: Secondary | ICD-10-CM | POA: Diagnosis present

## 2023-09-22 DIAGNOSIS — Z923 Personal history of irradiation: Secondary | ICD-10-CM

## 2023-09-22 DIAGNOSIS — W19XXXA Unspecified fall, initial encounter: Secondary | ICD-10-CM | POA: Diagnosis not present

## 2023-09-22 DIAGNOSIS — Z803 Family history of malignant neoplasm of breast: Secondary | ICD-10-CM

## 2023-09-22 DIAGNOSIS — J320 Chronic maxillary sinusitis: Secondary | ICD-10-CM | POA: Diagnosis not present

## 2023-09-22 DIAGNOSIS — I672 Cerebral atherosclerosis: Secondary | ICD-10-CM | POA: Diagnosis not present

## 2023-09-22 DIAGNOSIS — I6389 Other cerebral infarction: Secondary | ICD-10-CM | POA: Diagnosis not present

## 2023-09-22 DIAGNOSIS — D638 Anemia in other chronic diseases classified elsewhere: Secondary | ICD-10-CM | POA: Insufficient documentation

## 2023-09-22 DIAGNOSIS — Z96642 Presence of left artificial hip joint: Secondary | ICD-10-CM | POA: Diagnosis present

## 2023-09-22 DIAGNOSIS — I639 Cerebral infarction, unspecified: Secondary | ICD-10-CM

## 2023-09-22 DIAGNOSIS — Z7901 Long term (current) use of anticoagulants: Secondary | ICD-10-CM

## 2023-09-22 DIAGNOSIS — Z800 Family history of malignant neoplasm of digestive organs: Secondary | ICD-10-CM

## 2023-09-22 DIAGNOSIS — Z515 Encounter for palliative care: Secondary | ICD-10-CM

## 2023-09-22 DIAGNOSIS — R29708 NIHSS score 8: Secondary | ICD-10-CM | POA: Diagnosis present

## 2023-09-22 DIAGNOSIS — I10 Essential (primary) hypertension: Secondary | ICD-10-CM | POA: Diagnosis not present

## 2023-09-22 DIAGNOSIS — R0689 Other abnormalities of breathing: Secondary | ICD-10-CM | POA: Diagnosis not present

## 2023-09-22 DIAGNOSIS — I480 Paroxysmal atrial fibrillation: Secondary | ICD-10-CM | POA: Diagnosis not present

## 2023-09-22 DIAGNOSIS — I63411 Cerebral infarction due to embolism of right middle cerebral artery: Secondary | ICD-10-CM | POA: Diagnosis not present

## 2023-09-22 DIAGNOSIS — I6502 Occlusion and stenosis of left vertebral artery: Secondary | ICD-10-CM | POA: Diagnosis not present

## 2023-09-22 DIAGNOSIS — Z86718 Personal history of other venous thrombosis and embolism: Secondary | ICD-10-CM

## 2023-09-22 DIAGNOSIS — C7951 Secondary malignant neoplasm of bone: Secondary | ICD-10-CM | POA: Diagnosis not present

## 2023-09-22 DIAGNOSIS — Z79899 Other long term (current) drug therapy: Secondary | ICD-10-CM

## 2023-09-22 DIAGNOSIS — Z7984 Long term (current) use of oral hypoglycemic drugs: Secondary | ICD-10-CM | POA: Diagnosis not present

## 2023-09-22 DIAGNOSIS — Z7189 Other specified counseling: Secondary | ICD-10-CM | POA: Diagnosis not present

## 2023-09-22 DIAGNOSIS — Z87891 Personal history of nicotine dependence: Secondary | ICD-10-CM

## 2023-09-22 DIAGNOSIS — D6959 Other secondary thrombocytopenia: Secondary | ICD-10-CM | POA: Diagnosis present

## 2023-09-22 DIAGNOSIS — R531 Weakness: Secondary | ICD-10-CM | POA: Diagnosis present

## 2023-09-22 DIAGNOSIS — R0902 Hypoxemia: Secondary | ICD-10-CM | POA: Diagnosis not present

## 2023-09-22 DIAGNOSIS — I6523 Occlusion and stenosis of bilateral carotid arteries: Secondary | ICD-10-CM | POA: Diagnosis not present

## 2023-09-22 DIAGNOSIS — I2510 Atherosclerotic heart disease of native coronary artery without angina pectoris: Secondary | ICD-10-CM | POA: Diagnosis present

## 2023-09-22 LAB — COMPREHENSIVE METABOLIC PANEL
ALT: 13 U/L (ref 0–44)
AST: 24 U/L (ref 15–41)
Albumin: 2.7 g/dL — ABNORMAL LOW (ref 3.5–5.0)
Alkaline Phosphatase: 118 U/L (ref 38–126)
Anion gap: 12 (ref 5–15)
BUN: 12 mg/dL (ref 8–23)
CO2: 21 mmol/L — ABNORMAL LOW (ref 22–32)
Calcium: 9.4 mg/dL (ref 8.9–10.3)
Chloride: 102 mmol/L (ref 98–111)
Creatinine, Ser: 0.88 mg/dL (ref 0.61–1.24)
GFR, Estimated: >60 mL/min (ref >60)
Glucose, Bld: 158 mg/dL — ABNORMAL HIGH (ref 70–99)
Potassium: 3.7 mmol/L (ref 3.5–5.1)
Sodium: 135 mmol/L (ref 135–145)
Total Bilirubin: 0.8 mg/dL (ref <1.2)
Total Protein: 6.7 g/dL (ref 6.5–8.1)

## 2023-09-22 LAB — DIFFERENTIAL
Abs Immature Granulocytes: 0.05 10*3/uL (ref 0.00–0.07)
Basophils Absolute: 0 10*3/uL (ref 0.0–0.1)
Basophils Relative: 0 %
Eosinophils Absolute: 0 10*3/uL (ref 0.0–0.5)
Eosinophils Relative: 0 %
Immature Granulocytes: 1 %
Lymphocytes Relative: 2 %
Lymphs Abs: 0.2 10*3/uL — ABNORMAL LOW (ref 0.7–4.0)
Monocytes Absolute: 0.6 10*3/uL (ref 0.1–1.0)
Monocytes Relative: 6 %
Neutro Abs: 8.4 10*3/uL — ABNORMAL HIGH (ref 1.7–7.7)
Neutrophils Relative %: 91 %

## 2023-09-22 LAB — CBG MONITORING, ED
Glucose-Capillary: 144 mg/dL — ABNORMAL HIGH (ref 70–99)
Glucose-Capillary: 120 mg/dL — ABNORMAL HIGH (ref 70–99)

## 2023-09-22 LAB — GLUCOSE, CAPILLARY
Glucose-Capillary: 123 mg/dL — ABNORMAL HIGH (ref 70–99)
Glucose-Capillary: 142 mg/dL — ABNORMAL HIGH (ref 70–99)

## 2023-09-22 LAB — HEMOGLOBIN A1C
Hgb A1c MFr Bld: 5.4 % (ref 4.8–5.6)
Mean Plasma Glucose: 108.28 mg/dL

## 2023-09-22 LAB — APTT: aPTT: 34 s (ref 24–36)

## 2023-09-22 LAB — CBC
HCT: 29.7 % — ABNORMAL LOW (ref 39.0–52.0)
Hemoglobin: 9.7 g/dL — ABNORMAL LOW (ref 13.0–17.0)
MCH: 30.8 pg (ref 26.0–34.0)
MCHC: 32.7 g/dL (ref 30.0–36.0)
MCV: 94.3 fL (ref 80.0–100.0)
Platelets: 149 10*3/uL — ABNORMAL LOW (ref 150–400)
RBC: 3.15 MIL/uL — ABNORMAL LOW (ref 4.22–5.81)
RDW: 14.7 % (ref 11.5–15.5)
WBC: 9.3 10*3/uL (ref 4.0–10.5)
nRBC: 0 % (ref 0.0–0.2)

## 2023-09-22 LAB — PROTIME-INR
INR: 1.6 — ABNORMAL HIGH (ref 0.8–1.2)
Prothrombin Time: 19.2 s — ABNORMAL HIGH (ref 11.4–15.2)

## 2023-09-22 LAB — HIV ANTIBODY (ROUTINE TESTING W REFLEX): HIV Screen 4th Generation wRfx: NONREACTIVE

## 2023-09-22 LAB — ETHANOL: Alcohol, Ethyl (B): <10 mg/dL (ref <10)

## 2023-09-22 MED ORDER — HEPARIN (PORCINE) 25000 UT/250ML-% IV SOLN
1250.0000 [IU]/h | INTRAVENOUS | Status: DC
  Administered 2023-09-22: 1250 [IU]/h via INTRAVENOUS
  Filled 2023-09-22: qty 250

## 2023-09-22 MED ORDER — DEXAMETHASONE SODIUM PHOSPHATE 10 MG/ML IJ SOLN
8.0000 mg | Freq: Every day | INTRAMUSCULAR | Status: DC
  Administered 2023-09-22 – 2023-09-24 (×3): 8 mg via INTRAVENOUS
  Filled 2023-09-22 (×3): qty 1

## 2023-09-22 MED ORDER — STROKE: EARLY STAGES OF RECOVERY BOOK: Freq: Once | Status: AC

## 2023-09-22 MED ORDER — ALBUTEROL SULFATE (2.5 MG/3ML) 0.083% IN NEBU: 2.5000 mg | INHALATION_SOLUTION | Freq: Four times a day (QID) | RESPIRATORY_TRACT | Status: DC | PRN

## 2023-09-22 MED ORDER — PRAVASTATIN SODIUM 20 MG PO TABS
20.0000 mg | ORAL_TABLET | Freq: Every day | ORAL | Status: DC
  Administered 2023-09-23: 20 mg via ORAL
  Filled 2023-09-22: qty 1

## 2023-09-22 MED ORDER — TRAZODONE HCL 50 MG PO TABS: 50.0000 mg | ORAL_TABLET | Freq: Every evening | ORAL | Status: DC | PRN

## 2023-09-22 MED ORDER — IOHEXOL 350 MG/ML SOLN
75.0000 mL | Freq: Once | INTRAVENOUS | Status: AC | PRN
  Administered 2023-09-22: 75 mL via INTRAVENOUS

## 2023-09-22 MED ORDER — HYDROMORPHONE HCL 1 MG/ML IJ SOLN
0.5000 mg | INTRAMUSCULAR | Status: DC | PRN
  Administered 2023-09-23 (×2): 0.5 mg via INTRAVENOUS
  Filled 2023-09-22 (×2): qty 0.5

## 2023-09-22 MED ORDER — PROCHLORPERAZINE MALEATE 10 MG PO TABS: 10.0000 mg | ORAL_TABLET | Freq: Four times a day (QID) | ORAL | Status: DC | PRN

## 2023-09-22 MED ORDER — ACETAMINOPHEN 160 MG/5ML PO SOLN: 650.0000 mg | ORAL | Status: DC | PRN

## 2023-09-22 MED ORDER — SODIUM CHLORIDE 0.9 % IV SOLN: INTRAVENOUS | Status: AC

## 2023-09-22 MED ORDER — TRIAMCINOLONE ACETONIDE 0.1 % EX CREA
1.0000 | TOPICAL_CREAM | Freq: Three times a day (TID) | CUTANEOUS | Status: DC
  Administered 2023-09-22: 1 via TOPICAL
  Filled 2023-09-22 (×2): qty 15

## 2023-09-22 MED ORDER — HYDRALAZINE HCL 20 MG/ML IJ SOLN: 5.0000 mg | Freq: Four times a day (QID) | INTRAMUSCULAR | Status: DC | PRN

## 2023-09-22 MED ORDER — DIPHENOXYLATE-ATROPINE 2.5-0.025 MG PO TABS: 1.0000 | ORAL_TABLET | Freq: Four times a day (QID) | ORAL | Status: DC | PRN

## 2023-09-22 MED ORDER — INSULIN ASPART 100 UNIT/ML IJ SOLN: 0.0000 [IU] | Freq: Every day | INTRAMUSCULAR | Status: DC

## 2023-09-22 MED ORDER — LORAZEPAM 2 MG/ML IJ SOLN: 0.5000 mg | Freq: Four times a day (QID) | INTRAMUSCULAR | Status: DC | PRN

## 2023-09-22 MED ORDER — ENOXAPARIN SODIUM 40 MG/0.4ML IJ SOSY: 40.0000 mg | PREFILLED_SYRINGE | INTRAMUSCULAR | Status: DC

## 2023-09-22 MED ORDER — METOPROLOL TARTRATE 50 MG PO TABS: 50.0000 mg | ORAL_TABLET | Freq: Two times a day (BID) | ORAL | Status: DC

## 2023-09-22 MED ORDER — INSULIN ASPART 100 UNIT/ML IJ SOLN
0.0000 [IU] | Freq: Three times a day (TID) | INTRAMUSCULAR | Status: DC
  Administered 2023-09-22: 2 [IU] via SUBCUTANEOUS
  Administered 2023-09-23: 3 [IU] via SUBCUTANEOUS
  Administered 2023-09-23 – 2023-09-24 (×3): 2 [IU] via SUBCUTANEOUS
  Filled 2023-09-22 (×5): qty 1

## 2023-09-22 MED ORDER — LORAZEPAM 0.5 MG PO TABS: 0.5000 mg | ORAL_TABLET | Freq: Three times a day (TID) | ORAL | Status: DC | PRN

## 2023-09-22 MED ORDER — ACETAMINOPHEN 650 MG RE SUPP: 650.0000 mg | RECTAL | Status: DC | PRN

## 2023-09-22 MED ORDER — ONDANSETRON HCL 4 MG/2ML IJ SOLN: 4.0000 mg | Freq: Four times a day (QID) | INTRAMUSCULAR | Status: DC | PRN

## 2023-09-22 MED ORDER — DEXAMETHASONE 4 MG PO TABS
8.0000 mg | ORAL_TABLET | Freq: Every day | ORAL | Status: DC
  Filled 2023-09-22: qty 2

## 2023-09-22 MED ORDER — ENOXAPARIN SODIUM 100 MG/ML IJ SOSY
1.0000 mg/kg | PREFILLED_SYRINGE | Freq: Two times a day (BID) | INTRAMUSCULAR | Status: DC
  Administered 2023-09-22: 92.5 mg via SUBCUTANEOUS
  Filled 2023-09-22: qty 1

## 2023-09-22 MED ORDER — SODIUM CHLORIDE 0.9% FLUSH
10.0000 mL | Freq: Two times a day (BID) | INTRAVENOUS | Status: DC
  Administered 2023-09-22 – 2023-09-24 (×4): 10 mL via INTRAVENOUS

## 2023-09-22 MED ORDER — APIXABAN 5 MG PO TABS: 5.0000 mg | ORAL_TABLET | Freq: Two times a day (BID) | ORAL | Status: DC

## 2023-09-22 MED ORDER — ACETAMINOPHEN 325 MG PO TABS: 650.0000 mg | ORAL_TABLET | ORAL | Status: DC | PRN

## 2023-09-22 MED ORDER — SODIUM CHLORIDE 0.9% FLUSH
3.0000 mL | Freq: Once | INTRAVENOUS | Status: AC
  Administered 2023-09-22: 3 mL via INTRAVENOUS

## 2023-09-22 MED ORDER — OXYCODONE HCL 5 MG PO TABS: 5.0000 mg | ORAL_TABLET | ORAL | Status: DC | PRN

## 2023-09-22 MED ORDER — LACTULOSE 10 GM/15ML PO SOLN: 20.0000 g | Freq: Every day | ORAL | Status: DC | PRN

## 2023-09-22 NOTE — ED Triage Notes (Signed)
Pt to ED from home [facility], code stroke called in the field, neurologist met pt when arrived with EMS. CBG 144. Pt woke up normal then at 0745 had sudden onset of L sided weakness and pt fell. EMS BP was 147/72. Takes Eliquis BID, last dose last night. Arrived in CT with pt at 51.

## 2023-09-22 NOTE — ED Notes (Signed)
Pt to MRI.   Dr Chipper Herb was made aware that pt failed swallow screen and will not be given PO meds.

## 2023-09-22 NOTE — Consult Note (Signed)
NEUROLOGY CONSULTATION NOTE   Date of service: September 22, 2023 Patient Name: Christopher Marsh MRN:  [id number] DOB:  12-01-1960 Reason for consult: stroke code Requesting physician: Dr. Minna Antis _ _ _   _ __   _ __ _ _  __ __   _ __   __ _  History of Present Illness   This is a 62 year old man with a medical history significant for stage IV cholangiocarcinoma, antiphospholipid syndrome, A-fib, recently switched from Coumadin to Eliquis (last dose of Eliquis last night), hypertension, diabetes presents with acute onset of left-sided weakness.  He woke up normal however at 7:45 AM this morning he developed weakness of his left face, arm, leg.  Both of his legs are typically weak but his left leg is weaker today than it usually is.  Patient arrived via EMS and was activated as a stroke code.  NIH stroke scale was 8.  Head CT showed possible age-indeterminate infarct in the right basal ganglia with no mass effect or hemorrhage.  TNK was not administered due to contraindication of being on Eliquis.  CTA head and neck showed asymmetric absence of the right MCA M2 branches which may be considered in eLVO  in the setting of acute onset left-sided weakness.  However given his stage IV widely metastatic cholangiocarcinoma he would not be an appropriate candidate for IR.  Furthermore since his new symptoms were relatively mild (left nasolabial fold flattening, mild drift in left upper extremity, and leg weakness mildly worse than usual) he was not favored to have an exam consistent with a large vessel occlusion and there would be little if any benefit to undergoing intervention.   ROS   Per HPI: all other systems reviewed and are negative  Past History   I have reviewed the following:  Past Medical History:  Diagnosis Date   Antiphospholipid antibody syndrome (HCC)    Arthritis    Cancer (HCC)    Diabetes mellitus without complication (HCC)    DVT (deep venous thrombosis) (HCC)    Lt leg    Dyspnea    Elevated lipids    Erythrocytosis    Hyperlipidemia    Hypertension    Lower extremity edema    Multinodular thyroid    Post-thrombotic syndrome    Past Surgical History:  Procedure Laterality Date   CARDIOVERSION N/A 03/22/2023   Procedure: CARDIOVERSION;  Surgeon: Antonieta Iba, MD;  Location: [facility] ORS;  Service: Cardiovascular;  Laterality: N/A;   COLONOSCOPY WITH PROPOFOL N/A 09/23/2018   Per Dr. Norma Fredrickson, polyp, repeat in 3 yrs    EYE MUSCLE SURGERY     JOINT REPLACEMENT Left 07/22/2017   PORTA CATH INSERTION N/A 12/24/2022   Procedure: PORTA CATH INSERTION;  Surgeon: Annice Needy, MD;  Location: [facility] INVASIVE CV LAB;  Service: Cardiovascular;  Laterality: N/A;   TOTAL HIP ARTHROPLASTY Left 07/22/2017   Procedure: TOTAL HIP ARTHROPLASTY;  Surgeon: Donato Heinz, MD;  Location: [facility] ORS;  Service: Orthopedics;  Laterality: Left;   Family History  Problem Relation Age of Onset   Breast cancer Mother    Emphysema Father    Colon cancer Maternal Grandmother    Social History   Socioeconomic History   Marital status: Married    Spouse name: Not on file   Number of children: Not on file   Years of education: Not on file   Highest education level: Not on file  Occupational History   Not on file  Tobacco Use  Smoking status: Former    Current packs/day: 0.00    Average packs/day: 0.5 packs/day for 6.0 years (3.0 ttl pk-yrs)    Types: Cigarettes    Start date: 2    Quit date: 1982    Years since quitting: 42.9   Smokeless tobacco: Never   Tobacco comments:    Pt states when he did smoke he smoked at most 1 ppd. ALS 09/26/2022  Vaping Use   Vaping status: Never Used  Substance and Sexual Activity   Alcohol use: Not Currently    Comment: occ   Drug use: No   Sexual activity: Not on file  Other Topics Concern   Not on file  Social History Narrative   Not on file   Social Determinants of Health   Financial Resource Strain: Low Risk   (08/27/2022)   Overall Financial Resource Strain (CARDIA)    Difficulty of Paying Living Expenses: Not very hard  Food Insecurity: No Food Insecurity (09/22/2023)   Hunger Vital Sign    Worried About Running Out of Food in the Last Year: Never true    Ran Out of Food in the Last Year: Never true  Transportation Needs: No Transportation Needs (09/22/2023)   PRAPARE - Administrator, Civil Service (Medical): No    Lack of Transportation (Non-Medical): No  Physical Activity: Insufficiently Active (08/27/2022)   Exercise Vital Sign    Days of Exercise per Week: 3 days    Minutes of Exercise per Session: 30 min  Stress: Stress Concern Present (08/27/2022)   [employer] of Occupational Health - Occupational Stress Questionnaire    Feeling of Stress : Rather much  Social Connections: Socially Integrated (08/27/2022)   Social Connection and Isolation Panel [NHANES]    Frequency of Communication with Friends and Family: More than three times a week    Frequency of Social Gatherings with Friends and Family: More than three times a week    Attends Religious Services: 1 to 4 times per year    Active Member of [employer] or Organizations: Yes    Attends Banker Meetings: Never    Marital Status: Married   No Known Allergies  Medications   Medications Prior to Admission  Medication Sig Dispense Refill Last Dose   albuterol (VENTOLIN HFA) 108 (90 Base) MCG/ACT inhaler TAKE 2 PUFFS BY MOUTH EVERY 4 HOURS AS NEEDED 8.5 each 1 prn   amoxicillin-clavulanate (AUGMENTIN) 875-125 MG tablet Take 1 tablet by mouth 2 (two) times daily. 14 tablet 0 09/21/2023   apixaban (ELIQUIS) 5 MG TABS tablet Take 1 tablet (5 mg total) by mouth 2 (two) times daily. 60 tablet 1 09/21/2023 at 1900   Calcium Carbonate 500 MG CHEW Chew 2 tablets (1,000 mg total) by mouth daily. 60 tablet 0 09/21/2023   [START ON 09/27/2023] dexamethasone (DECADRON) 4 MG tablet Take 1 tablet (4 mg total) by mouth  daily. 14 tablet 0 09/21/2023   diphenoxylate-atropine (LOMOTIL) 2.5-0.025 MG tablet Take 1 tablet by mouth 4 (four) times daily as needed for diarrhea or loose stools. 120 tablet 3 prn   glipiZIDE (GLUCOTROL) 10 MG tablet TAKE 1 TABLET (10 MG TOTAL) BY MOUTH TWICE A DAY BEFORE A MEAL 180 tablet 3 09/21/2023   ketoconazole (NIZORAL) 2 % cream Apply 1 Application topically 2 (two) times daily as needed for irritation. 60 g 5 09/21/2023   Lactulose 20 GM/30ML SOLN Take 30 mLs (20 g total) by mouth daily as needed (constipation.). 450 mL 0  prn   megestrol (MEGACE) 40 MG tablet Take 2 tablets (80 mg total) by mouth 2 (two) times daily. 120 tablet 0 09/21/2023   metFORMIN (GLUCOPHAGE) 500 MG tablet TAKE TWO TABLETS BY MOUTH EVERY MORNING WITH BREAKFAST 180 tablet 1 09/21/2023   metoprolol tartrate (LOPRESSOR) 50 MG tablet Take 1 tablet (50 mg total) by mouth 2 (two) times daily. 180 tablet 3 09/21/2023   MUCINEX D MAX STRENGTH [phone] MG TB12 Take 1,200 mg by mouth 2 (two) times daily as needed (for congestion or to loosen mucous in the chest).   prn   ondansetron (ZOFRAN) 4 MG tablet Take by mouth.   prn   oxyCODONE (OXY IR/ROXICODONE) 5 MG immediate release tablet Take 1 tablet (5 mg total) by mouth every 4 (four) hours as needed for severe pain. 60 tablet 0 prn   phytonadione (MEPHYTON) 5 MG tablet TAKE 1/2 TABLET (2.5 MG) BY MOUTH ONLY IF ADVISED BY ANTICOAGULATION CLINIC TO TAKE 1 tablet 0    potassium chloride SA (KLOR-CON M) 20 MEQ tablet Take 1 tablet (20 mEq total) by mouth 2 (two) times daily. 10 tablet 0 09/21/2023   pravastatin (PRAVACHOL) 20 MG tablet TAKE 1 TABLET BY MOUTH EVERY DAY 90 tablet 3 09/21/2023   prochlorperazine (COMPAZINE) 10 MG tablet Take 10 mg by mouth every 6 (six) hours as needed.   prn   silver sulfADIAZINE (SILVADENE) 1 % cream Apply 1 Application topically daily. 50 g 0    SLOW-MAG 71.5-119 MG TBEC SR tablet TAKE 1 TABLET (64 MG TOTAL) BY MOUTH DAILY 60 tablet 1  09/21/2023   torsemide (DEMADEX) 20 MG tablet Take 1 tablet (20 mg total) by mouth 2 (two) times daily as needed. 180 tablet 3 09/21/2023   traZODone (DESYREL) 50 MG tablet TAKE 1 TABLET BY MOUTH AT BEDTIME AS NEEDED FOR SLEEP. 90 tablet 0 09/21/2023   triamcinolone cream (KENALOG) 0.1 % Apply 1 Application topically 3 (three) times daily. 80 g 5 Past Week   dexamethasone (DECADRON) 4 MG tablet Take 2 tablets (8 mg total) by mouth daily. 14 tablet 0    fentaNYL (DURAGESIC) 50 MCG/HR Place 1 patch onto the skin every 3 (three) days. 10 patch 0    glucose blood test strip 1 each by Other route as needed for other. accu chek aviva plusUse as instructed      lidocaine-prilocaine (EMLA) cream Apply 1 Application topically as needed. (Patient not taking: Reported on 09/22/2023) 30 g 5 Not Taking   LORazepam (ATIVAN) 0.5 MG tablet TAKE 1 TABLET (0.5 MG TOTAL) BY MOUTH EVERY 8 (EIGHT) HOURS AS NEEDED FOR ANXIETY (NAUSEA VOMITING). (Patient not taking: Reported on 09/22/2023) 60 tablet 0 Not Taking   potassium chloride (KLOR-CON) 10 MEQ tablet Take 1 tablet (10 mEq total) by mouth daily. 90 tablet 3    tadalafil (CIALIS) 20 MG tablet Take 0.5-1 tablets (10-20 mg total) by mouth every other day as needed for erectile dysfunction. (Patient not taking: Reported on 09/22/2023) 20 tablet 11 Not Taking      Current Facility-Administered Medications:    [START ON 09/23/2023]  stroke: early stages of recovery book, , Does not apply, Once, Chipper Herb, Ping T, MD   0.9 %  sodium chloride infusion, , Intravenous, Continuous, Mikey College T, MD, Last Rate: 100 mL/hr at 09/22/23 1430, New Bag at 09/22/23 1430   acetaminophen (TYLENOL) tablet 650 mg, 650 mg, Oral, Q4H PRN **OR** acetaminophen (TYLENOL) 160 MG/5ML solution 650 mg, 650 mg, Per Tube, Q4H  PRN **OR** acetaminophen (TYLENOL) suppository 650 mg, 650 mg, Rectal, Q4H PRN, Mikey College T, MD   albuterol (PROVENTIL) (2.5 MG/3ML) 0.083% nebulizer solution 2.5 mg, 2.5 mg,  Inhalation, Q6H PRN, Emeline General, MD   dexamethasone (DECADRON) injection 8 mg, 8 mg, Intravenous, Daily, Mikey College T, MD, 8 mg at 09/22/23 1419   diphenoxylate-atropine (LOMOTIL) 2.5-0.025 MG per tablet 1 tablet, 1 tablet, Oral, QID PRN, Mikey College T, MD   heparin ADULT infusion 100 units/mL (25000 units/267mL), 1,250 Units/hr, Intravenous, Continuous, Coulter, Carolyn, RPH   hydrALAZINE (APRESOLINE) injection 5 mg, 5 mg, Intravenous, Q6H PRN, Mikey College T, MD   HYDROmorphone (DILAUDID) injection 0.5 mg, 0.5 mg, Intravenous, Q4H PRN, Mikey College T, MD   insulin aspart (novoLOG) injection 0-15 Units, 0-15 Units, Subcutaneous, TID WC, Zhang, Ping T, MD   insulin aspart (novoLOG) injection 0-5 Units, 0-5 Units, Subcutaneous, QHS, Zhang, Ilda Foil T, MD   lactulose (CHRONULAC) 10 GM/15ML solution 20 g, 20 g, Oral, Daily PRN, Mikey College T, MD   LORazepam (ATIVAN) injection 0.5 mg, 0.5 mg, Intravenous, Q6H PRN, Mikey College T, MD   LORazepam (ATIVAN) tablet 0.5 mg, 0.5 mg, Oral, Q8H PRN, Mikey College T, MD   ondansetron [facility]) injection 4 mg, 4 mg, Intravenous, Q6H PRN, Mikey College T, MD   pravastatin (PRAVACHOL) tablet 20 mg, 20 mg, Oral, Q2000, Mikey College T, MD   sodium chloride flush (NS) 0.9 % injection 10 mL, 10 mL, Intravenous, Q12H, Zhang, Ping T, MD, 10 mL at 09/22/23 1423   traZODone (DESYREL) tablet 50 mg, 50 mg, Oral, QHS PRN, Mikey College T, MD   triamcinolone cream (KENALOG) 0.1 % cream 1 Application, 1 Application, Topical, TID, Mikey College T, MD  Facility-Administered Medications Ordered in Other Encounters:    sodium chloride flush (NS) 0.9 % injection 10 mL, 10 mL, Intravenous, PRN, [name], MD, 10 mL at 07/18/23 0951  Vitals   Vitals:   09/22/23 1200 09/22/23 1443 09/22/23 1506 09/22/23 1556  BP: (!) 173/79   (!) 169/94  Pulse: (!) 37  99 (!) 105  Resp: 19  12 18   Temp:  98.7 F (37.1 C)  97.6 F (36.4 C)  TempSrc:  Oral    SpO2: 100%  100% 100%  Weight:      Height:          Body mass index is 30.3 kg/m.  Physical Exam   Physical Exam Gen: A&O x4, NAD HEENT: Atraumatic, normocephalic;mucous membranes moist; oropharynx clear, tongue without atrophy or fasciculations. Neck: Supple, trachea midline. Resp: CTAB, no w/r/r CV: RRR, no m/g/r; nml S1 and S2. 2+ symmetric peripheral pulses. Abd: soft/NT/ND; nabs x 4 quad Extrem: Nml bulk; no cyanosis, clubbing, or edema.  Neuro: *MS: A&O x4. Follows multi-step commands.  *Speech: fluid, nondysarthric, able to name and repeat *CN:    I: Deferred   II,III: PERRLA, VFF by confrontation, optic discs unable to be visualized 2/2 pupillary constriction   III,IV,VI: EOMI w/o nystagmus, no ptosis   V: Sensation intact from V1 to V3 to LT   VII: Eyelid closure was full.  L NLF flattening.   VIII: Hearing intact to voice   IX,X: Voice normal, palate elevates symmetrically    XI: SCM/trap 5/5 bilat   XII: Tongue protrudes midline, no atrophy or fasciculations   *Motor:   Normal bulk.  No tremor, rigidity or bradykinesia. No pronator drift. RUE no drift, LUE drift but not to bed, BLE no movement against gravity (chronic  baseline for his R leg, his L leg is also usually weak per patient but not as weak as today) *Sensory: Intact to light touch, pinprick, temperature vibration throughout. Symmetric. Propioception intact bilat.  No double-simultaneous extinction.  *Coordination:  Finger-to-nose, heel-to-shin, rapid alternating motions were intact. *Reflexes:  2+ and symmetric throughout without clonus; toes down-going bilat *Gait: deferred  NIHSS  1a Level of Conscious.: 0 1b LOC Questions: 0 1c LOC Commands: 0 2 Best Gaze: 0 3 Visual: 0 4 Facial Palsy: 1 5a Motor Arm - left: 1 5b Motor Arm - Right: 0 6a Motor Leg - Left: 3 6b Motor Leg - Right: 3 7 Limb Ataxia: 0 8 Sensory: 0 9 Best Language: 0 10 Dysarthria: 0 11 Extinct. and Inatten.: 0  TOTAL: 8   Premorbid mRS = 3   Labs   CBC:  Recent  Labs  Lab 09/22/23 0850  WBC 9.3  NEUTROABS 8.4*  HGB 9.7*  HCT 29.7*  MCV 94.3  PLT 149*    Basic Metabolic Panel:  Lab Results  Component Value Date   NA 135 09/22/2023   K 3.7 09/22/2023   CO2 21 (L) 09/22/2023   GLUCOSE 158 (H) 09/22/2023   BUN 12 09/22/2023   CREATININE 0.88 09/22/2023   CALCIUM 9.4 09/22/2023   GFRNONAA >60 09/22/2023   GFRAA >60 07/24/2017   Lipid Panel:  Lab Results  Component Value Date   LDLCALC 84 08/08/2022   HgbA1c:  Lab Results  Component Value Date   HGBA1C 5.4 09/22/2023   Urine Drug Screen: No results found for: "LABOPIA", "COCAINSCRNUR", "LABBENZ", "AMPHETMU", "THCU", "LABBARB"  Alcohol Level     Component Value Date/Time   ETH <10 09/22/2023 0850    CT Head without contrast: 1. Age indeterminate anterior right basal ganglia lacunar infarct, but might have been present on February PET-CT. 2. No acute cortically based infarct or acute intracranial hemorrhage identified. ASPECTS 10. 3. Advanced calcified atherosclerosis.  CT angio Head and Neck with contrast: 1. Generalized diminutive appearance of all circle-of-Willis branches, etiology unclear. But asymmetric absence of right MCA M2 branches compatible with ELVO in the setting of left side deficit. These results were communicated to Dr. Selina Cooley at 0910 hours on 09/22/2023 by text page via the [facility] messaging system. 2. Metastatic cholangiocarcinoma. Large T4 thoracic lytic metastasis with epidural and extraosseous tumor extension asymmetric to the right. 3. Heavily calcified bilateral ICA siphons and vertebral artery V4 segments with generalized mild to moderate stenosis. 4. But mild for age extracranial atherosclerosis. No significant atherosclerotic stenosis in the neck.  MRI Brain 1. Positive for scattered small acute infarcts in the Right MCA territory, from the posterior right external capsule, through the corona radiata and to the postcentral gyrus. Additional  superimposed small contralateral left anterior frontal lobe white matter lacunar infarct. No associated hemorrhage or mass effect.   2. Subtle chronic left PCA territory infarct at the occipital pole. Otherwise mild for age underlying chronic small vessel disease including at the right caudate, brainstem.  CNS imaging personally reviewed; I agree with above interpretations  Impression   This is a 62 year old man with a medical history significant for stage IV cholangiocarcinoma, antiphospholipid syndrome, A-fib, recently switched from Coumadin to Eliquis (last dose of Eliquis last night), hypertension, diabetes presents with acute onset of left-sided weakness. MRI brain confirmed multifocal embolic infarcts (high number but all relatively small). While anticoagulation increases the risk of hemorrhagic conversion I feel the benefit  of anticoagulation for stroke risk reduction outweighs  the risk of hemorrhage because 1) infarcts are numerous but small, 2) strokes are clearly embolic, 3) in addition to his a fib he also has multiple reasons to be hypercoagulable including antiphospholipid antibody syndrome and malignancy.  Recommendations   - Admit for stroke workup - Permissive HTN x48 hrs from sx onset goal BP <180/110 (since on anticoagulation). PRN labetalol or hydralazine if BP above these parameters. Avoid oral antihypertensives. - TTE  - Check A1c and LDL + add statin per guidelines - Heparin gtt for anticoagulation in the acute post-stroke period - q4 hr neuro checks - STAT head CT for any change in neuro exam - Tele - PT/OT/SLP - Stroke education - Amb referral to neurology upon discharge  Will continue to follow  ______________________________________________________________________   Thank you for the opportunity to take part in the care of this patient. If you have any further questions, please contact the neurology consultation attending.  Signed,  Bing Neighbors,  MD [facility] [phone]  If 7pm- 7am, please page neurology on call as listed in AMION.  **Any copied and pasted documentation in this note was written by me in another application not billed for and pasted by me into this document.

## 2023-09-22 NOTE — Progress Notes (Signed)
Inpatient Rehab Admissions Coordinator:  ? ?Per therapy recommendations,  patient was screened for CIR candidacy by Devaney Segers, MS, CCC-SLP. At this time, Pt. Appears to be a a potential candidate for CIR. I will place   order for rehab consult per protocol for full assessment. Please contact me any with questions. ? ?Trine Fread, MS, CCC-SLP ?Rehab Admissions Coordinator  ?[phone] (celll) ?[phone] (office) ? ?

## 2023-09-22 NOTE — Evaluation (Signed)
Clinical/Bedside Swallow Evaluation Patient Details  Name: Christopher Marsh MRN: [id number] Date of Birth: [birth year]  Today's Date: 09/22/2023 Time: SLP Start Time (ACUTE ONLY): 1145 SLP Stop Time (ACUTE ONLY): 1202 SLP Time Calculation (min) (ACUTE ONLY): 17 min  Past Medical History:  Past Medical History:  Diagnosis Date   Antiphospholipid antibody syndrome (HCC)    Arthritis    Cancer (HCC)    Diabetes mellitus without complication (HCC)    DVT (deep venous thrombosis) (HCC)    Lt leg   Dyspnea    Elevated lipids    Erythrocytosis    Hyperlipidemia    Hypertension    Lower extremity edema    Multinodular thyroid    Post-thrombotic syndrome    Past Surgical History:  Past Surgical History:  Procedure Laterality Date   CARDIOVERSION N/A 03/22/2023   Procedure: CARDIOVERSION;  Surgeon: Antonieta Iba, MD;  Location: [facility] ORS;  Service: Cardiovascular;  Laterality: N/A;   COLONOSCOPY WITH PROPOFOL N/A 09/23/2018   Per Dr. Norma Fredrickson, polyp, repeat in 3 yrs    EYE MUSCLE SURGERY     JOINT REPLACEMENT Left 07/22/2017   PORTA CATH INSERTION N/A 12/24/2022   Procedure: PORTA CATH INSERTION;  Surgeon: Annice Needy, MD;  Location: [facility] INVASIVE CV LAB;  Service: Cardiovascular;  Laterality: N/A;   TOTAL HIP ARTHROPLASTY Left 07/22/2017   Procedure: TOTAL HIP ARTHROPLASTY;  Surgeon: Donato Heinz, MD;  Location: [facility] ORS;  Service: Orthopedics;  Laterality: Left;   HPI:  Pt admitted to [facility] on 09/22/23 for c/o stroke like symptoms including: L sided weakness resulting in mechanical fall. On Eliquis at baseline and determined not to be candidate for TNK. Significant PMH includes: antiphospholipid antibody syndrome, paroxysmal atrial fibrillation on Eliquis, diabetes, hypertension, hyperlipidemia, cholangiocarcinoma. Head CT, 09/22/23, "1. Age indeterminate anterior right basal ganglia lacunar infarct, but might have been present on February PET-CT. 2. No acute cortically based infarct  or acute intracranial hemorrhage identified. ASPECTS 10. 3. Advanced calcified atherosclerosis."   Assessment / Plan / Recommendation  Clinical Impression  Pt seen for clinical swallowing evaluation. Pt alert, pleasant, and cooperative. Mild dysarthria appreciated c/b minimally articulatory imprecision. Mother and Son at bedside. Pt recalled MBSS 09/17/22 as well its results which were unremarkable. Today's evaluation d/c prematurely as pt needed to be transported to STAT MRI. Oral motor examinination completed and significant for L facial weakness, reduced ROM, and reduced sensation. Pt with reports of drooling, throat clearing/coughing, and increased difficulty swallowing for the "last several days." Pt presents with s/sx at least a mild oral dysphagia c/b anterior loss of puree on L as well as reported anterior loss of liquids via cup earlier today. Concern for pharyngeal dysphagia given delayed coughing with sequential sips of water as well as mildly wet vocal quality with single sips which cleared with spontaneous throat clearing. Given pt's clinical presentation and need for further medical workup to inform pt's overall clinical picture, recommended NPO x meds crushed in puree and single sips of water for comfort. SLP to f/u per POC for further evaluation and cognitive-linguistic evaluation.  SLP Visit Diagnosis: Dysphagia, oropharyngeal phase (R13.12)    Aspiration Risk  Mild aspiration risk;Moderate aspiration risk    Diet Recommendation NPO except meds (single sips of water for comfort)    Liquid Administration via: Cup;Straw Medication Administration: Crushed with puree Supervision:  (set up for single sips of water) Compensations: Slow rate;Small sips/bites Postural Changes: Seated upright at 90 degrees    Other  Recommendations  Oral Care Recommendations: Oral care QID (oral care prior to water) Caregiver Recommendations: Have oral suction available    Recommendations for follow up  therapy are one component of a multi-disciplinary discharge planning process, led by the attending physician.  Recommendations may be updated based on patient status, additional functional criteria and insurance authorization.  Follow up Recommendations  (TBD)         Functional Status Assessment Patient has had a recent decline in their functional status and demonstrates the ability to make significant improvements in function in a reasonable and predictable amount of time.  Frequency and Duration min 2x/week  2 weeks       Prognosis Prognosis for improved oropharyngeal function: Fair Barriers to Reach Goals: Severity of deficits      Swallow Study   General Date of Onset: 09/22/23 HPI: Pt admitted [facility] on 09/22[facility] for c/o stroke like symptoms including: L sided weakness resulting in mechanical fall. On Eliquis at baseline and determined not to be candidate for TNK. Significant PMH includes: antiphospholipid antibody syndrome, paroxysmal atrial fibrillation on Eliquis, diabetes, hypertension, hyperlipidemia, cholangiocarcinoma. Type of Study: Bedside Swallow Evaluation Previous Swallow Assessment: MBSS 09/17/22 - recommended a regular diet with thin liquids Diet Prior to this Study: NPO Temperature Spikes Noted: Yes Respiratory Status: Room air History of Recent Intubation: Yes Behavior/Cognition: Alert;Cooperative;Pleasant mood Oral Cavity Assessment: Within Functional Limits Oral Care Completed by SLP: Recent completion by staff Oral Cavity - Dentition: Adequate natural dentition Vision: Functional for self-feeding Self-Feeding Abilities: Able to feed self Patient Positioning: Upright in bed Baseline Vocal Quality: Normal Volitional Cough: Strong;Congested Volitional Swallow: Able to elicit    Oral/Motor/Sensory Function Overall Oral Motor/Sensory Function: Moderate impairment Facial ROM: Reduced left Facial Symmetry: Abnormal symmetry left Facial Strength: Reduced  left Facial Sensation: Reduced left   Ice Chips Ice chips: Not tested   Thin Liquid Thin Liquid: Impaired Presentation: Straw Pharyngeal  Phase Impairments: Cough - Delayed; Multiple epochs of coughing; Minimally wet vocal quality with single sips which cleared with spontaneous throat clearing   Nectar Thick Nectar Thick Liquid: Not tested   Honey Thick Honey Thick Liquid: Not tested   Puree Puree: Impaired Presentation: Self Fed Oral Phase Impairments: Reduced labial seal Oral Phase Functional Implications: Left anterior spillage Pharyngeal Phase Impairments:  (WFL)   Solid     Solid: Within functional limits Presentation: Self Fed     Clyde Canterbury, M.S., CCC-SLP Speech-Language Pathologist [facility] [id number] Arnette Felts)  Alessandra Bevels Shaquel Chavous 09/22/2023,1:00 PM

## 2023-09-22 NOTE — Consult Note (Signed)
PHARMACY - ANTICOAGULATION CONSULT NOTE  Pharmacy Consult for Heparin Infusion  Indication: APLS with recurrent DVT & atrial fibrillation   Patient Measurements: Height: 6\' 2"  (188 cm) Weight: 107 kg (236 lb) IBW/kg (Calculated) : 82.2 Heparin Dosing Weight: 104 kg  Vital Signs: Temp: 98.7 F (37.1 C) (11/24 1443) Temp Source: Oral (11/24 1443) BP: 173/79 (11/24 1200) Pulse Rate: 99 (11/24 1506)  Labs: Recent Labs    09/22/23 0850  HGB 9.7*  HCT 29.7*  PLT 149*  APTT 34  LABPROT 19.2*  INR 1.6*  CREATININE 0.88   Estimated Creatinine Clearance: 110.5 mL/min (by C-G formula based on SCr of 0.88 mg/dL).  Medical History: Past Medical History:  Diagnosis Date   Antiphospholipid antibody syndrome (HCC)    Arthritis    Cancer (HCC)    Diabetes mellitus without complication (HCC)    DVT (deep venous thrombosis) (HCC)    Lt leg   Dyspnea    Elevated lipids    Erythrocytosis    Hyperlipidemia    Hypertension    Lower extremity edema    Multinodular thyroid    Post-thrombotic syndrome    Medications:  PTA Eliquis - last dose taken 11/23 PM at 1900   Assessment: Christopher Marsh is a 62 y.o. male on chronic anticoagulation for antiphospholipid syndrome with recurrent DVTs and atrial fibrillation. PMH is significant for metastatic cholangiocarcinoma. Patient's oncologist recently switched him from warfarin to Eliquis to improve quality of life. Pharmacy has been consulted to initiate and manage heparin infusion.   Baseline Labs: aPTT 34, PT 19.2, INR 1.6, Hgb 9.7, Hct 29.7, Plt 149   Goal of Therapy:  Heparin level 0.3-0.5 units/ml, no boluses  Monitor platelets by anticoagulation protocol: Yes   Plan:  No bolus per MD Start heparin infusion at 1250 units/hr at 2200 Check HL 6 hours after start of infusion Continue to monitor H&H and platelets daily while on heparin infusion   Celene Squibb, PharmD Clinical Pharmacist 09/22/2023 4:03 PM

## 2023-09-22 NOTE — ED Provider Notes (Signed)
[facility] Provider Note    Event Date/Time   First MD Initiated Contact with Patient 09/22/23 [phone]     (approximate)  History   Chief Complaint: Code Stroke  HPI  Christopher Marsh is a 62 y.o. male with a past medical history of antiphospholipid antibody syndrome, paroxysmal atrial fibrillation on Eliquis, diabetes, hypertension, hyperlipidemia, cholangiocarcinoma who presents to the emergency department for acute onset left-sided weakness.  According to the wife for the past few weeks patient has been experiencing progressive generalized weakness however at 7:45 AM this morning developed acute onset of left-sided weakness.  Patient arrived via EMS as a code stroke and taken immediately to CT scan.  Physical Exam   Triage Vital Signs: ED Triage Vitals  Encounter Vitals Group     BP      Systolic BP Percentile      Diastolic BP Percentile      Pulse      Resp      Temp      Temp src      SpO2      Weight      Height      Head Circumference      Peak Flow      Pain Score      Pain Loc      Pain Education      Exclude from Growth Chart     Most recent vital signs: There were no vitals filed for this visit.  General: Awake, no distress.  CV:  Good peripheral perfusion.  Regular rate and rhythm  Resp:  Normal effort.  Equal breath sounds bilaterally.  Abd:  No distention.  Soft, nontender.  No rebound or guarding. Other:  Patient has slightly diminished grip strength in left upper extremity patient has a mild pronator drift in left upper extremity.  Minimal strength in bilateral lower extremities which is baseline for the patient.   ED Results / Procedures / Treatments   EKG  EKG viewed and read by myself shows sinus tachycardia at 110 bpm with a narrow QRS, normal axis, normal intervals, no concerning ST changes.  RADIOLOGY  I have reviewed and interpreted the CT head.  I do not see any obvious bleed on my evaluation. Radiology has read  an age-indeterminate anterior right basal ganglia lacunar infarct.   MEDICATIONS ORDERED IN ED: Medications  sodium chloride flush (NS) 0.9 % injection 3 mL (3 mLs Intravenous Given 09/22/23 0901)  iohexol (OMNIPAQUE) 350 MG/ML injection 75 mL (75 mLs Intravenous Contrast Given 09/22/23 0851)     IMPRESSION / MDM / ASSESSMENT AND PLAN / ED COURSE  I reviewed the triage vital signs and the nursing notes.  Patient's presentation is most consistent with acute presentation with potential threat to life or bodily function.  Patient presents to the emergency department as a code stroke taken immediately to CT scan I have reviewed the CT scan showing no obvious stroke or bleed on my evaluation.  On exam patient does have left upper extremity weakness compared to the right with a mild pronator drift which family states is new although they also believe this strength is improving since this morning.  Patient is on Eliquis, neurology has seen and given his Eliquis is not a TNK candidate at this time but also has improving symptoms reassuringly.  Patient CBC is normal/baseline.  Remainder the lab work is pending.  We will admit to the hospital service for further CVA workup after his  ED workup is been completed.  CTA shows questionable LVO.  I spoke to Dr. Selina Cooley regarding this as the patient is stage IV cancer on chemotherapy on Eliquis she does not believe the patient is an IR candidate.  Reassuringly patient's symptoms seem to be improving per patient.  The remainder the patient's lab work shows no significant findings, chemistry largely at baseline.  Will admit to the hospital service for further workup and treatment.  CRITICAL CARE Performed by: Minna Antis   Total critical care time: 30 minutes  Critical care time was exclusive of separately billable procedures and treating other patients.  Critical care was necessary to treat or prevent imminent or life-threatening  deterioration.  Critical care was time spent personally by me on the following activities: development of treatment plan with patient and/or surrogate as well as nursing, discussions with consultants, evaluation of patient's response to treatment, examination of patient, obtaining history from patient or surrogate, ordering and performing treatments and interventions, ordering and review of laboratory studies, ordering and review of radiographic studies, pulse oximetry and re-evaluation of patient's condition.   FINAL CLINICAL IMPRESSION(S) / ED DIAGNOSES   CVA   Note:  This document was prepared using Dragon voice recognition software and may include unintentional dictation errors.   Minna Antis, MD 09/22/23 1025

## 2023-09-22 NOTE — Consult Note (Signed)
PHARMACY - ANTICOAGULATION CONSULT NOTE  Pharmacy Consult for Enoxaparin  Indication: Antiphospholipid syndrome with recurrent DVT & atrial fibrillation   Patient Measurements: Height: 6\' 2"  (188 cm) Weight: 107 kg (236 lb) IBW/kg (Calculated) : 82.2  Vital Signs: Temp: 98.1 F (36.7 C) (11/24 0909) Temp Source: Oral (11/24 0909) BP: 154/78 (11/24 0909) Pulse Rate: 117 (11/24 0909)  Labs: Recent Labs    09/22/23 0850  HGB 9.7*  HCT 29.7*  PLT 149*  APTT 34  LABPROT 19.2*  INR 1.6*  CREATININE 0.88   Estimated Creatinine Clearance: 110.5 mL/min (by C-G formula based on SCr of 0.88 mg/dL).  Medical History: Past Medical History:  Diagnosis Date   Antiphospholipid antibody syndrome (HCC)    Arthritis    Cancer (HCC)    Diabetes mellitus without complication (HCC)    DVT (deep venous thrombosis) (HCC)    Lt leg   Dyspnea    Elevated lipids    Erythrocytosis    Hyperlipidemia    Hypertension    Lower extremity edema    Multinodular thyroid    Post-thrombotic syndrome    Medications:  PTA Eliquis - last dose taken 11/23 PM at 1900   Assessment: Christopher Marsh is a 62 year old male on chronic anticoagulation for antiphospholipid syndrome with recurrent DVTs and atrial fibrillation. PMH is significant for metastatic cholangiocarcinoma. Patient's oncologist recently switched him from warfarin to Eliquis to improve quality of life. Pharmacy has been consulted for initiation of Lovenox in this patient.   Baseline labs: Hgb 9.7, PLT 149, INR 1.6  Goal of Therapy:  Monitor platelets by anticoagulation protocol: Yes   Plan:  Start Lovenox 1 mg/kg SQ Q12H based on adjusted body weight  Will plan to obtain a steady state anti-Xa level after the 3rd or 4th dose  Monitor CBC daily while on Lovenox   Littie Deeds, PharmD Pharmacy Resident  09/22/2023 12:39 PM

## 2023-09-22 NOTE — Consult Note (Signed)
1610 Code stroke cart activated/ Dr Selina Cooley at bedside in CT MRS 4.  CT results reported to Dr Selina Cooley by the radiologist.  Christopher Marsh dismissed by Dr Selina Cooley while patient was in CT.

## 2023-09-22 NOTE — Progress Notes (Signed)
CODE STROKE- PHARMACY COMMUNICATION  Time CODE STROKE called/page received: 0823  Time response to CODE STROKE was made (in person or via phone): 0827  Time Stroke Kit retrieved from [employer] (only if needed): TNK not given   Name of Provider/Nurse contacted: Dr. Selina Cooley   Past Medical History:  Diagnosis Date   Antiphospholipid antibody syndrome (HCC)    Arthritis    Cancer (HCC)    Diabetes mellitus without complication (HCC)    DVT (deep venous thrombosis) (HCC)    Lt leg   Dyspnea    Elevated lipids    Erythrocytosis    Hyperlipidemia    Hypertension    Lower extremity edema    Multinodular thyroid    Post-thrombotic syndrome    Prior to Admission medications   Medication Sig Start Date End Date Taking? Authorizing Provider  albuterol (VENTOLIN HFA) 108 (90 Base) MCG/ACT inhaler TAKE 2 PUFFS BY MOUTH EVERY 4 HOURS AS NEEDED 01/02/23   Nelwyn Salisbury, MD  amoxicillin-clavulanate (AUGMENTIN) 875-125 MG tablet Take 1 tablet by mouth 2 (two) times daily. 09/13/23   Hunsucker, Lesia Sago, MD  apixaban (ELIQUIS) 5 MG TABS tablet Take 1 tablet (5 mg total) by mouth 2 (two) times daily. 09/17/23   [name], MD  Calcium Carbonate 500 MG CHEW Chew 2 tablets (1,000 mg total) by mouth daily. 02/20/23   [name], MD  dexamethasone (DECADRON) 4 MG tablet Take 2 tablets (8 mg total) by mouth daily. 09/20/23   [name], MD  dexamethasone (DECADRON) 4 MG tablet Take 1 tablet (4 mg total) by mouth daily. 09/27/23   [name], MD  diphenoxylate-atropine (LOMOTIL) 2.5-0.025 MG tablet Take 1 tablet by mouth 4 (four) times daily as needed for diarrhea or loose stools. 03/27/23   [name], MD  fentaNYL (DURAGESIC) 50 MCG/HR Place 1 patch onto the skin every 3 (three) days. 08/29/23   [name], MD  glipiZIDE (GLUCOTROL) 10 MG tablet TAKE 1 TABLET (10 MG TOTAL) BY MOUTH TWICE A DAY BEFORE A MEAL 08/13/23   Nelwyn Salisbury, MD  glucose blood test strip 1 each by Other route as needed for other. accu chek aviva  plusUse as instructed    [provider]  ketoconazole (NIZORAL) 2 % cream Apply 1 Application topically 2 (two) times daily as needed for irritation. 05/06/23   Nelwyn Salisbury, MD  Lactulose 20 GM/30ML SOLN Take 30 mLs (20 g total) by mouth daily as needed (constipation.). 03/07/23   [name], MD  lidocaine-prilocaine (EMLA) cream Apply 1 Application topically as needed. 12/24/22   [name], MD  LORazepam (ATIVAN) 0.5 MG tablet TAKE 1 TABLET (0.5 MG TOTAL) BY MOUTH EVERY 8 (EIGHT) HOURS AS NEEDED FOR ANXIETY (NAUSEA VOMITING). 01/29/23   [name], MD  megestrol (MEGACE) 40 MG tablet Take 2 tablets (80 mg total) by mouth 2 (two) times daily. 08/26/23   [name], MD  metFORMIN (GLUCOPHAGE) 500 MG tablet TAKE TWO TABLETS BY MOUTH EVERY MORNING WITH BREAKFAST 05/21/23   Nelwyn Salisbury, MD  metoprolol tartrate (LOPRESSOR) 50 MG tablet Take 1 tablet (50 mg total) by mouth 2 (two) times daily. 04/24/23 08/07/23  Dunn, Ryan M, PA-C  MUCINEX D MAX STRENGTH [phone] MG TB12 Take 1,200 mg by mouth 2 (two) times daily as needed (for congestion or to loosen mucous in the chest).    [provider]  ondansetron (ZOFRAN) 4 MG tablet Take by mouth.    [provider]  oxyCODONE (  OXY IR/ROXICODONE) 5 MG immediate release tablet Take 1 tablet (5 mg total) by mouth every 4 (four) hours as needed for severe pain. 09/16/23   Borders, Daryl Eastern, NP  phytonadione (MEPHYTON) 5 MG tablet TAKE 1/2 TABLET (2.5 MG) BY MOUTH ONLY IF ADVISED BY ANTICOAGULATION CLINIC TO TAKE 09/05/23   Eden Emms, NP  potassium chloride (KLOR-CON) 10 MEQ tablet Take 1 tablet (10 mEq total) by mouth daily. 03/22/23 09/09/23  Antonieta Iba, MD  potassium chloride SA (KLOR-CON M) 20 MEQ tablet Take 1 tablet (20 mEq total) by mouth 2 (two) times daily. 09/09/23   [name], MD  pravastatin (PRAVACHOL) 20 MG tablet TAKE 1 TABLET BY MOUTH EVERY DAY 08/19/23   Nelwyn Salisbury, MD  prochlorperazine (COMPAZINE) 10 MG tablet Take 10 mg  by mouth every 6 (six) hours as needed. 05/30/23   [provider]  silver sulfADIAZINE (SILVADENE) 1 % cream Apply 1 Application topically daily. 05/27/23   [name], MD  SLOW-MAG 71.5-119 MG TBEC SR tablet TAKE 1 TABLET (64 MG TOTAL) BY MOUTH DAILY 07/08/23   [name], MD  tadalafil (CIALIS) 20 MG tablet Take 0.5-1 tablets (10-20 mg total) by mouth every other day as needed for erectile dysfunction. 08/08/22   Nelwyn Salisbury, MD  torsemide (DEMADEX) 20 MG tablet Take 1 tablet (20 mg total) by mouth 2 (two) times daily as needed. 03/13/23 09/09/23  Antonieta Iba, MD  traZODone (DESYREL) 50 MG tablet TAKE 1 TABLET BY MOUTH AT BEDTIME AS NEEDED FOR SLEEP. 08/05/23   [name], MD  triamcinolone cream (KENALOG) 0.1 % Apply 1 Application topically 3 (three) times daily. 05/06/23   Nelwyn Salisbury, MD   Littie Deeds, PharmD Pharmacy Resident  09/22/2023 8:49 AM

## 2023-09-22 NOTE — Evaluation (Signed)
Physical Therapy Evaluation Patient Details Name: Christopher Marsh MRN: [id number] DOB: Dec 09, 1960 Today's Date: 09/22/2023  History of Present Illness  Pt admitted to [facility] on 09/22/23 for c/o stroke like symptoms including: L sided weakness resulting in mechanical fall. On Eliquis at baseline and determined not to be candidate for TNK. Imaging significant for R basal ganglia lacunar infarct. Significant PMH includes: antiphospholipid antibody syndrome, paroxysmal atrial fibrillation on Eliquis, diabetes, hypertension, hyperlipidemia, cholangiocarcinoma.   Clinical Impression  Pt is a 62 year old M admitted to hospital on 09/22/23 for R basal ganglia infarct. At baseline, pt is mod I with ambulation in rollator, ADL's, light meals, and medication management. He is able to drive, but family primarily provides transportation.   Pt presents with L sided weakness, general BLE weakness (L>R), +3 BLE edema with trophic skin changes, decreased gross balance with R posterolateral lean, decreased activity tolerance, tachycardia with mobility, impaired proprioception, and impaired fine/gross motor coordination, resulting in impaired functional mobility from baseline. Due to deficits, pt required mod assist +1-2 for bed mobility, min-mod assist for transfers with RW, and mod assist +2 to take 2 lateral steps towards HOB. Heavy R posterolateral lean that pt was initially able to correct with cues for anterior weight shift; requiring increased assist with fatigue for balance.  Deficits limit the pt's ability to safely and independently perform ADL's, transfer, and ambulate. Pt will benefit from acute skilled PT services to address deficits for return to baseline function. He will benefit from post acute therapy services.         If plan is discharge home, recommend the following: Two people to help with walking and/or transfers;Two people to help with bathing/dressing/bathroom;Direct supervision/assist for  medications management;Assist for transportation;Assistance with cooking/housework;Help with stairs or ramp for entrance     Equipment Recommendations  (defer to post acute; will likely benefit from tub bench with DC home)  Recommendations for Other Services  Rehab consult    Functional Status Assessment Patient has had a recent decline in their functional status and demonstrates the ability to make significant improvements in function in a reasonable and predictable amount of time.     Precautions / Restrictions Precautions Precautions: Fall Restrictions Other Position/Activity Restrictions: DNR      Mobility  Bed Mobility Overal bed mobility: Needs Assistance Bed Mobility: Supine to Sit, Sit to Supine     Supine to sit: Mod assist Sit to supine: Mod assist, +2 for physical assistance   General bed mobility comments: mod assist +1 for supine>sit transfer from EOB with HOB elevated and use of BUE for support. Increased time/effort for BLE facilitation to EOB. Mod assist +2 for trunk and BLE facilitation to lie supine, HOB flat. Multimodal cues for safety, sequencing, and hand placement.    Transfers Overall transfer level: Needs assistance Equipment used: Rolling walker (2 wheels) Transfers: Sit to/from Stand Sit to Stand: Min assist           General transfer comment: min assist for power to stand from EOB with RW, multimodal cues for safety, sequencing, and hand placement. Mod assist for controlled descent to sit EOB with RW. Multimodal cues for safety, sequencing, and hand placement.    Ambulation/Gait Ambulation/Gait assistance: Mod assist, +2 physical assistance Gait Distance (Feet): 1 Feet (2 lateral steps)           General Gait Details: Mod assist +2 to take 2 lateral steps towards HOB with RW. Demonstrates increased R sided lean requiring increased assist and cueing  for upright neutral posture and for adequate weight shift for step facilitation. Increased  assist from PT for LLE facilitation for stepping.     Balance Overall balance assessment: Needs assistance Sitting-balance support: Bilateral upper extremity supported, Feet supported Sitting balance-Leahy Scale: Poor Sitting balance - Comments: intermittent assist ranging from fair-poor with R posterolateral lean. Pt able to attend to balance with cues for anterolateral weight shift and BUE support.     Standing balance-Leahy Scale: Poor Standing balance comment: mod assist +2 for standing balance in RW with heavy R sided lean, increased difficulty attending to balance in standing.                             Pertinent Vitals/Pain Pain Assessment Pain Assessment: 0-10 Pain Location: back 6/10, L side (knee) 5/10 from fall Pain Intervention(s): Limited activity within patient's tolerance, Monitored during session, Repositioned    Home Living Family/patient expects to be discharged to:: Private residence Living Arrangements: Spouse/significant other Available Help at Discharge: Family;Available 24 hours/day Type of Home: House Home Access: Stairs to enter   [employer] of Steps: front entrance 4 STE with bilateral railing (cannot reach both); side entrance with 7 STE, bilateral railing (can reach both). RN states they are having ramp installed at the home on Tuesday.   Home Layout: Two level;Able to live on main level with bedroom/bathroom Home Equipment: Rolling Walker (2 wheels);Rollator (4 wheels);Cane - single point;BSC/3in1      Prior Function Prior Level of Function : History of Falls (last six months)             Mobility Comments: mod I with rollator for limited ambulation; drives sometimes but spouse primarily drives. ADLs Comments: mod I with ADL's, light IADL's, and medication management. Otherwise family assists with IADL's.     Extremity/Trunk Assessment   Upper Extremity Assessment Upper Extremity Assessment: LUE  deficits/detail;Overall [facility] for tasks assessed (RUE grossly 4/5. sensation grossly intact) LUE Deficits / Details: shoulder flexion 3-/5; elbow flexion 4/5, elbow extension 5/5. Fluctuating grip strength LUE Sensation: WNL (light touch sensation intact; impaired proprioception) LUE Coordination: decreased fine motor    Lower Extremity Assessment Lower Extremity Assessment: LLE deficits/detail;Generalized weakness (RLE grossly 3/5, sensation intact) LLE Deficits / Details: grossly 3/5, limited AROM secondary to weakness. LLE Sensation: WNL (impaired proprioception) LLE Coordination: decreased gross motor       Communication   Communication Cueing Techniques: Verbal cues;Visual cues;Tactile cues  Cognition Arousal: Alert Behavior During Therapy: WFL for tasks assessed/performed                                   General Comments: delayed processing and fatigue; increased time/effort for processing, intermittent repetition required for task sequencing        General Comments General comments (skin integrity, edema, etc.): scab to L knee from fall prior to hospitalization. +3 pitting edema of BLE with trophic skin changes. Kerlix dressing over scab to L elbow (also from fall prior to hospitalization). Tachycardia rangig from 110's - 130's during session, increasing >130 with seated and standing balance at EOB.    Exercises Other Exercises Other Exercises: Participates with bed mobility, transfers, and minimal gait. Increased assist due to L sided weakness, impaired motor control, and impaired proprioception. Other Exercises: Pt and family educated re: PT role/POC, DC recommendations, safety with functional mobility, DME use, corrective balance. They verbalized  understanding.   Assessment/Plan    PT Assessment Patient needs continued PT services  PT Problem List Decreased strength;Decreased range of motion;Decreased activity tolerance;Decreased balance;Decreased  mobility;Decreased coordination;Decreased cognition;Impaired sensation;Decreased skin integrity;Pain       PT Treatment Interventions DME instruction;Gait training;Stair training;Functional mobility training;Therapeutic activities;Therapeutic exercise;Balance training;Neuromuscular re-education;Patient/family education;Wheelchair mobility training    PT Goals (Current goals can be found in the Care Plan section)  Acute Rehab PT Goals Patient Stated Goal: "get back to being functional" PT Goal Formulation: With patient/family Time For Goal Achievement: 10/06/23 Potential to Achieve Goals: Fair    Frequency Min 1X/week        AM-PAC PT "6 Clicks" Mobility  Outcome Measure Help needed turning from your back to your side while in a flat bed without using bedrails?: A Lot Help needed moving from lying on your back to sitting on the side of a flat bed without using bedrails?: A Lot Help needed moving to and from a bed to a chair (including a wheelchair)?: A Lot Help needed standing up from a chair using your arms (e.g., wheelchair or bedside chair)?: A Lot Help needed to walk in hospital room?: A Lot Help needed climbing 3-5 steps with a railing? : Total 6 Click Score: 11    End of Session Equipment Utilized During Treatment: Gait belt Activity Tolerance: Patient limited by fatigue Patient left: in bed;with call bell/phone within reach;with family/visitor present Nurse Communication: Mobility status PT Visit Diagnosis: Unsteadiness on feet (R26.81);Repeated falls (R29.6);Muscle weakness (generalized) (M62.81);Hemiplegia and hemiparesis Hemiplegia - Right/Left: Left Hemiplegia - dominant/non-dominant: Non-dominant    Time: 8295-6213 PT Time Calculation (min) (ACUTE ONLY): 52 min   Charges:   PT Evaluation $PT Eval Moderate Complexity: 1 Mod PT Treatments $Gait Training: 8-22 mins $Therapeutic Activity: 8-22 mins PT General Charges $$ ACUTE PT VISIT: 1 Visit         Vira Blanco, PT, DPT 2:45 PM,09/22/23 Physical Therapist - [facility]

## 2023-09-22 NOTE — ED Notes (Signed)
Advised nurse that patient has ready bed 

## 2023-09-22 NOTE — H&P (Signed)
History and Physical    Christopher Marsh:[id number] DOB: 1961/01/17 DOA: 09/22/2023  PCP: Nelwyn Salisbury, MD (Confirm with patient/family/NH records and if not entered, this has to be entered at [facility] point of entry) Patient coming from: Home  I have personally briefly reviewed patient's old medical records in [facility] Link  Chief Complaint: Left-sided weakness  HPI: Christopher Marsh is a 62 y.o. male with medical history significant of stage IV cholangiocarcinoma, antiphospholipid syndrome with hypercoagulable state recently switched from Coumadin to Eliquis therapy, PAF, HTN, IIDM, presented with new onset of left-sided weakness.  Patient woke up this morning with new onset of left-sided weakness involving left arm left hand and left leg and left foot.  Denies any numbness tingling of the left side.  Patient on chronic anticoagulation therapy for antiphospholipid syndrome repeated DVT, 2 weeks ago patient's oncology decided to switch patient from Coumadin to Eliquis due to poorly target INR and improving left quality.  According to family patient has had multiple episodes of pneumonia this year.  Last week patient started to have a worsening of productive cough especially after eating or drinking and his pulmonologist started him on antibiotics and steroid.  He completed 7 days course of amoxicillin on Friday however he continued complaining about " hard to clear saliva and continued to have cough after eat and drink"  ED Course: [place] called.  CT head showed age indeterminant right basal ganglia infarct.  CT angiogram showed generalized diminutive appearance of the circle of Willis branch etiology unclear.  Case was reviewed by neurology who considered that the patient has a history of advanced cholangiocarcinoma on active chemotherapy, not a candidate for IR intervention.  Review of Systems: As per HPI otherwise 14 point review of systems negative.    Past Medical History:   Diagnosis Date   Antiphospholipid antibody syndrome (HCC)    Arthritis    Cancer (HCC)    Diabetes mellitus without complication (HCC)    DVT (deep venous thrombosis) (HCC)    Lt leg   Dyspnea    Elevated lipids    Erythrocytosis    Hyperlipidemia    Hypertension    Lower extremity edema    Multinodular thyroid    Post-thrombotic syndrome     Past Surgical History:  Procedure Laterality Date   CARDIOVERSION N/A 03/22/2023   Procedure: CARDIOVERSION;  Surgeon: Antonieta Iba, MD;  Location: [facility] ORS;  Service: Cardiovascular;  Laterality: N/A;   COLONOSCOPY WITH PROPOFOL N/A 09/23/2018   Per Dr. Norma Fredrickson, polyp, repeat in 3 yrs    EYE MUSCLE SURGERY     JOINT REPLACEMENT Left 07/22/2017   PORTA CATH INSERTION N/A 12/24/2022   Procedure: PORTA CATH INSERTION;  Surgeon: Annice Needy, MD;  Location: [facility] INVASIVE CV LAB;  Service: Cardiovascular;  Laterality: N/A;   TOTAL HIP ARTHROPLASTY Left 07/22/2017   Procedure: TOTAL HIP ARTHROPLASTY;  Surgeon: Donato Heinz, MD;  Location: [facility] ORS;  Service: Orthopedics;  Laterality: Left;     reports that he quit smoking about 42 years ago. His smoking use included cigarettes. He started smoking about 48 years ago. He has a 3 pack-year smoking history. He has never used smokeless tobacco. He reports that he does not currently use alcohol. He reports that he does not use drugs.  No Known Allergies  Family History  Problem Relation Age of Onset   Breast cancer Mother    Emphysema Father    Colon cancer Maternal Grandmother  Prior to Admission medications   Medication Sig Start Date End Date Taking? Authorizing Provider  albuterol (VENTOLIN HFA) 108 (90 Base) MCG/ACT inhaler TAKE 2 PUFFS BY MOUTH EVERY 4 HOURS AS NEEDED 01/02/23   Nelwyn Salisbury, MD  amoxicillin-clavulanate (AUGMENTIN) 875-125 MG tablet Take 1 tablet by mouth 2 (two) times daily. 09/13/23   Hunsucker, Lesia Sago, MD  apixaban (ELIQUIS) 5 MG TABS tablet Take 1 tablet  (5 mg total) by mouth 2 (two) times daily. 09/17/23   [name], MD  Calcium Carbonate 500 MG CHEW Chew 2 tablets (1,000 mg total) by mouth daily. 02/20/23   [name], MD  dexamethasone (DECADRON) 4 MG tablet Take 2 tablets (8 mg total) by mouth daily. 09/20/23   [name], MD  dexamethasone (DECADRON) 4 MG tablet Take 1 tablet (4 mg total) by mouth daily. 09/27/23   [name], MD  diphenoxylate-atropine (LOMOTIL) 2.5-0.025 MG tablet Take 1 tablet by mouth 4 (four) times daily as needed for diarrhea or loose stools. 03/27/23   [name], MD  fentaNYL (DURAGESIC) 50 MCG/HR Place 1 patch onto the skin every 3 (three) days. 08/29/23   [name], MD  glipiZIDE (GLUCOTROL) 10 MG tablet TAKE 1 TABLET (10 MG TOTAL) BY MOUTH TWICE A DAY BEFORE A MEAL 08/13/23   Nelwyn Salisbury, MD  glucose blood test strip 1 each by Other route as needed for other. accu chek aviva plusUse as instructed    [provider]  ketoconazole (NIZORAL) 2 % cream Apply 1 Application topically 2 (two) times daily as needed for irritation. 05/06/23   Nelwyn Salisbury, MD  Lactulose 20 GM/30ML SOLN Take 30 mLs (20 g total) by mouth daily as needed (constipation.). 03/07/23   [name], MD  lidocaine-prilocaine (EMLA) cream Apply 1 Application topically as needed. 12/24/22   [name], MD  LORazepam (ATIVAN) 0.5 MG tablet TAKE 1 TABLET (0.5 MG TOTAL) BY MOUTH EVERY 8 (EIGHT) HOURS AS NEEDED FOR ANXIETY (NAUSEA VOMITING). 01/29/23   [name], MD  megestrol (MEGACE) 40 MG tablet Take 2 tablets (80 mg total) by mouth 2 (two) times daily. 08/26/23   [name], MD  metFORMIN (GLUCOPHAGE) 500 MG tablet TAKE TWO TABLETS BY MOUTH EVERY MORNING WITH BREAKFAST 05/21/23   Nelwyn Salisbury, MD  metoprolol tartrate (LOPRESSOR) 50 MG tablet Take 1 tablet (50 mg total) by mouth 2 (two) times daily. 04/24/23 08/07/23  Dunn, Ryan M, PA-C  MUCINEX D MAX STRENGTH [phone] MG TB12 Take 1,200 mg by mouth 2 (two) times daily as needed (for congestion or to loosen mucous in  the chest).    [provider]  ondansetron (ZOFRAN) 4 MG tablet Take by mouth.    [provider]  oxyCODONE (OXY IR/ROXICODONE) 5 MG immediate release tablet Take 1 tablet (5 mg total) by mouth every 4 (four) hours as needed for severe pain. 09/16/23   Borders, Daryl Eastern, NP  phytonadione (MEPHYTON) 5 MG tablet TAKE 1/2 TABLET (2.5 MG) BY MOUTH ONLY IF ADVISED BY ANTICOAGULATION CLINIC TO TAKE 09/05/23   Eden Emms, NP  potassium chloride (KLOR-CON) 10 MEQ tablet Take 1 tablet (10 mEq total) by mouth daily. 03/22/23 09/09/23  Antonieta Iba, MD  potassium chloride SA (KLOR-CON M) 20 MEQ tablet Take 1 tablet (20 mEq total) by mouth 2 (two) times daily. 09/09/23   [name], MD  pravastatin (PRAVACHOL) 20 MG tablet TAKE 1 TABLET BY MOUTH EVERY DAY 08/19/23   Nelwyn Salisbury,  MD  prochlorperazine (COMPAZINE) 10 MG tablet Take 10 mg by mouth every 6 (six) hours as needed. 05/30/23   [provider]  silver sulfADIAZINE (SILVADENE) 1 % cream Apply 1 Application topically daily. 05/27/23   [name], MD  SLOW-MAG 71.5-119 MG TBEC SR tablet TAKE 1 TABLET (64 MG TOTAL) BY MOUTH DAILY 07/08/23   [name], MD  tadalafil (CIALIS) 20 MG tablet Take 0.5-1 tablets (10-20 mg total) by mouth every other day as needed for erectile dysfunction. 08/08/22   Nelwyn Salisbury, MD  torsemide (DEMADEX) 20 MG tablet Take 1 tablet (20 mg total) by mouth 2 (two) times daily as needed. 03/13/23 09/09/23  Antonieta Iba, MD  traZODone (DESYREL) 50 MG tablet TAKE 1 TABLET BY MOUTH AT BEDTIME AS NEEDED FOR SLEEP. 08/05/23   [name], MD  triamcinolone cream (KENALOG) 0.1 % Apply 1 Application topically 3 (three) times daily. 05/06/23   Nelwyn Salisbury, MD    Physical Exam: Vitals:   09/22/23 0909 09/22/23 0912  BP: (!) 154/78   Pulse: (!) 117   Resp: 12   Temp: 98.1 F (36.7 C)   TempSrc: Oral   SpO2: 100%   Weight:  107 kg  Height:  6\' 2"  (1.88 m)    Constitutional: NAD, calm,  comfortable Vitals:   09/22/23 0909 09/22/23 0912  BP: (!) 154/78   Pulse: (!) 117   Resp: 12   Temp: 98.1 F (36.7 C)   TempSrc: Oral   SpO2: 100%   Weight:  107 kg  Height:  6\' 2"  (1.88 m)   Eyes: PERRL, lids and conjunctivae normal ENMT: Mucous membranes are moist. Posterior pharynx clear of any exudate or lesions.Normal dentition.  Neck: normal, supple, no masses, no thyromegaly Respiratory: clear to auscultation bilaterally, no wheezing, no crackles. Normal respiratory effort. No accessory muscle use.  Cardiovascular: Regular rate and rhythm, no murmurs / rubs / gallops. No extremity edema. 2+ pedal pulses. No carotid bruits.  Abdomen: no tenderness, no masses palpated. No hepatosplenomegaly. Bowel sounds positive.  Musculoskeletal: no clubbing / cyanosis. No joint deformity upper and lower extremities. Good ROM, no contractures. Normal muscle tone.  Skin: no rashes, lesions, ulcers. No induration Neurologic: CN 2-12 grossly intact. Sensation intact, DTR normal. Strength 4/5 in left arm and left hand and left leg and the left foot compared to 5/5 on right side Psychiatric: Normal judgment and insight. Alert and oriented x 3. Normal mood.     Labs on Admission: I have personally reviewed following labs and imaging studies  CBC: Recent Labs  Lab 09/22/23 0850  WBC 9.3  NEUTROABS 8.4*  HGB 9.7*  HCT 29.7*  MCV 94.3  PLT 149*   Basic Metabolic Panel: Recent Labs  Lab 09/22/23 0850  NA 135  K 3.7  CL 102  CO2 21*  GLUCOSE 158*  BUN 12  CREATININE 0.88  CALCIUM 9.4   GFR: Estimated Creatinine Clearance: 110.5 mL/min (by C-G formula based on SCr of 0.88 mg/dL). Liver Function Tests: Recent Labs  Lab 09/22/23 0850  AST 24  ALT 13  ALKPHOS 118  BILITOT 0.8  PROT 6.7  ALBUMIN 2.7*   No results for input(s): "LIPASE", "AMYLASE" in the last 168 hours. No results for input(s): "AMMONIA" in the last 168 hours. Coagulation Profile: Recent Labs  Lab  09/16/23 1529 09/22/23 0850  INR 1.3* 1.6*   Cardiac Enzymes: No results for input(s): "CKTOTAL", "CKMB", "CKMBINDEX", "TROPONINI" in the last 168 hours. BNP (last  3 results) No results for input(s): "PROBNP" in the last 8760 hours. HbA1C: No results for input(s): "HGBA1C" in the last 72 hours. CBG: Recent Labs  Lab 09/22/23 0837  GLUCAP 144*   Lipid Profile: No results for input(s): "CHOL", "HDL", "LDLCALC", "TRIG", "CHOLHDL", "LDLDIRECT" in the last 72 hours. Thyroid Function Tests: No results for input(s): "TSH", "T4TOTAL", "FREET4", "T3FREE", "THYROIDAB" in the last 72 hours. Anemia Panel: No results for input(s): "VITAMINB12", "FOLATE", "FERRITIN", "TIBC", "IRON", "RETICCTPCT" in the last 72 hours. Urine analysis:    Component Value Date/Time   COLORURINE YELLOW 09/15/2022 1549   APPEARANCEUR CLEAR 09/15/2022 1549   LABSPEC 1.020 09/15/2022 1549   PHURINE 5.0 09/15/2022 1549   GLUCOSEU NEGATIVE 09/15/2022 1549   HGBUR NEGATIVE 09/15/2022 1549   HGBUR negative 11/23/2009 0844   BILIRUBINUR NEGATIVE 09/15/2022 1549   BILIRUBINUR 1+ 11/18/2017 1600   KETONESUR NEGATIVE 09/15/2022 1549   PROTEINUR NEGATIVE 09/15/2022 1549   UROBILINOGEN 4.0 (A) 11/18/2017 1600   UROBILINOGEN 0.2 11/23/2009 0844   NITRITE NEGATIVE 09/15/2022 1549   LEUKOCYTESUR NEGATIVE 09/15/2022 1549    Radiological Exams on Admission: CT ANGIO HEAD NECK W WO CM (CODE STROKE)  Result Date: 09/22/2023 CLINICAL DATA:  Code stroke. 62 year old male left side deficit. Metastatic cholangiocarcinoma. EXAM: CT ANGIOGRAPHY HEAD AND NECK TECHNIQUE: Multidetector CT imaging of the head and neck was performed using the standard protocol during bolus administration of intravenous contrast. Multiplanar CT image reconstructions and MIPs were obtained to evaluate the vascular anatomy. Carotid stenosis measurements (when applicable) are obtained utilizing NASCET criteria, using the distal internal carotid diameter as  the denominator. RADIATION DOSE REDUCTION: This exam was performed according to the departmental dose-optimization program which includes automated exposure control, adjustment of the mA and/or kV according to patient size and/or use of iterative reconstruction technique. CONTRAST:  75mL OMNIPAQUE IOHEXOL 350 MG/ML SOLN COMPARISON:  Head CT today.  Thoracic MRI 07/18/2023. FINDINGS: CTA NECK Skeleton: Destructive thoracic vertebral metastasis at T4 redemonstrated. Epidural and extraosseous tumor there asymmetric to the right (series 5, image 190). T4 vertebral height appears stable since the September MRI. Upper chest: Right chest Port-A-Cath. Mediastinal lipomatosis. No superior mediastinal lymphadenopathy. No upper lung metastatic disease. Other neck: Heterogeneously enlarged left thyroid This has been evaluated on previous imaging. (ref: J Am Coll Radiol. 2015 Feb;12(2): 143-50).No other neck mass or lymphadenopathy. Aortic arch: 3 vessel arch.  Calcified aortic atherosclerosis. Right carotid system: Brachiocephalic artery and right CCA origin without significant plaque or stenosis. Mild calcified plaque at the right ICA origin and bulb. Right ICA is patent to the skull base without stenosis. Left carotid system: Similar mild left ICA origin atherosclerosis without stenosis. Vertebral arteries: Minimal proximal right subclavian plaque without stenosis. Normal right vertebral artery origin. Right vertebral artery is patent in the neck and to the skull base with no significant stenosis. Minimal proximal left subclavian artery plaque. Calcified atherosclerosis at the left vertebral artery origin with only mild stenosis on series 7, image 321. Codominant left vertebral artery is patent to the skull base with no additional plaque or stenosis. CTA HEAD Posterior circulation: Heavily calcified bilateral vertebral artery V4 segments on series 7, image 155 with mild to moderate bilateral stenosis. Distal vertebrals and  vertebrobasilar junction remain patent. Patent basilar artery without stenosis. Patent although diminutive SCA and PCA origins. Posterior communicating arteries are diminutive or absent. Bilateral PCA branches appear diminutive (series 8, image 21) but seem to remain patent. Anterior circulation: Both ICA siphons are patent. Cavernous and supraclinoid  ICAs are heavily calcified with mild to moderate stenosis bilaterally. Patent carotid termini. Patent although diminutive MCA and ACA origins, 1st order segments (series 10, image 19). Diminutive or absent anterior communicating artery. ACA branches appear diminutive but patent. Left MCA M1 segment and bifurcation are patent without stenosis. Left MCA branches appear diminutive but patent. Contralateral right MCA M1 is patent, diminutive. Right MCA bifurcation is patent. But there are absent right MCA M2 branches, especially in the middle division is seen on series 12, image 22. Due to diminutive vessel caliber, discrete occlusion of these is difficult to identified. Venous sinuses: Faint contrast bolus, grossly patent. Anatomic variants: None significant. Review of the MIP images confirms the above findings IMPRESSION: 1. Generalized diminutive appearance of all circle-of-Willis branches, etiology unclear. But asymmetric absence of right MCA M2 branches compatible with ELVO in the setting of left side deficit. These results were communicated to Dr. Selina Cooley at 0910 hours on 09/22/2023 by text page via the [facility] messaging system. 2. Metastatic cholangiocarcinoma. Large T4 thoracic lytic metastasis with epidural and extraosseous tumor extension asymmetric to the right. 3. Heavily calcified bilateral ICA siphons and vertebral artery V4 segments with generalized mild to moderate stenosis. 4. But mild for age extracranial atherosclerosis. No significant atherosclerotic stenosis in the neck. Electronically Signed   By: Odessa Fleming M.D.   On: 09/22/2023 09:18   CT HEAD CODE  STROKE WO CONTRAST  Result Date: 09/22/2023 CLINICAL DATA:  Code stroke. 62 year old male left side deficit. Cholangiocarcinoma. EXAM: CT HEAD WITHOUT CONTRAST TECHNIQUE: Contiguous axial images were obtained from the base of the skull through the vertex without intravenous contrast. RADIATION DOSE REDUCTION: This exam was performed according to the departmental dose-optimization program which includes automated exposure control, adjustment of the mA and/or kV according to patient size and/or use of iterative reconstruction technique. COMPARISON:  Face CT 05/14/2014.  PET-CT 12/10/2022. FINDINGS: Brain: Stable cerebral volume since February. No midline shift, ventriculomegaly, mass effect, evidence of mass lesion, or acute intracranial hemorrhage. Extensive dural calcifications. Confluent but indistinct hypodensity at the anterior right basal ganglia, right caudate nucleus and adjacent frontal horn white matter series 3, image 18. But this may have been present in February. Elsewhere gray-white differentiation within normal limits. No cortically based acute infarct identified. Vascular: Advanced calcified atherosclerosis at the skull base. No suspicious intracranial vascular hyperdensity. Skull: No acute osseous abnormality identified. Sinuses/Orbits: Improved right [facility] pattern of obstructive sinus disease since February. Residual mucoperiosteal thickening in the right maxillary sinus. Tympanic cavities and mastoids are clear. Other: No gaze deviation.  Calcified scalp vessel atherosclerosis. ASPECTS [facility] Stroke Program Early CT Score) Total score (0-10 with 10 being normal): 10 IMPRESSION: 1. Age indeterminate anterior right basal ganglia lacunar infarct, but might have been present on February PET-CT. 2. No acute cortically based infarct or acute intracranial hemorrhage identified. ASPECTS 10. 3. Advanced calcified atherosclerosis. Salient findings were communicated to Dr. Selina Cooley at 8:50 am on 09/22/2023 by  text page via the [facility] messaging system. Electronically Signed   By: Odessa Fleming M.D.   On: 09/22/2023 08:50    EKG: Independently reviewed.  Sinus rhythm, chronic RBBB no acute ST changes, frequent PVCs.  Assessment/Plan Principal Problem:   Stroke [facility]) Active Problems:   Stroke (cerebrum) (HCC)  (please populate well all problems here in Problem List. (For example, if patient is on BP meds at home and you resume or decide to hold them, it is a problem that needs to be her. Same for CAD, COPD,  HLD and so on)  Acute left-sided paresis Right basal ganglia infarct -Not entirely clear whether this should be considered Eliquis treatment failure as patient was just switched from Coumadin to Eliquis about 2 weeks ago. -CTA does show some evidence of significant diminutive circulation in the platelets branches and asymmetric absence of right MCA M2 branch compatible with left-sided deficit.  Case was discussed with on-call neurology, who considered that the patient has advanced cholangiole carcinoma undergoing active chemotherapy, as result he is not a candidate for IR catheter-based intervention.  Patient made aware. -For now we will continue Eliquis -Brain MRI, echocardiogram -Discontinue Megace  Worsening of oropharyngeal dysphagia -May also related to stroke, n.p.o. for now and speech evaluation.  HTN -Allow permissive hypertension, as needed hydralazine  IIDM -Hold off glipizide and metformin -Start SSI  Antiphospholipid syndrome with recurrent DVTs -Continue Eliquis  Stage IV cholangiocarcinoma -Chemotherapy on hold due to severe side effect of severe anorexia -Outpatient follow-up with oncology  DVT prophylaxis: Lovenox Code Status: Full code Family Communication: Wife and daughter at bedside Disposition Plan: Expect less than 2 midnight hospital stay Consults called: Neurology Admission status: Telemetry observation   Emeline General MD [facility] Pager  [phone]  09/22/2023, 11:01 AM

## 2023-09-23 ENCOUNTER — Other Ambulatory Visit: Payer: BC Managed Care – PPO

## 2023-09-23 ENCOUNTER — Encounter: Payer: Self-pay | Admitting: Oncology

## 2023-09-23 ENCOUNTER — Ambulatory Visit: Payer: BC Managed Care – PPO | Admitting: Oncology

## 2023-09-23 ENCOUNTER — Ambulatory Visit: Payer: BC Managed Care – PPO | Admitting: Radiation Oncology

## 2023-09-23 ENCOUNTER — Inpatient Hospital Stay (HOSPITAL_COMMUNITY)
Admit: 2023-09-23 | Discharge: 2023-09-23 | Disposition: A | Discharge status: Home / Self Care | Payer: BC Managed Care – PPO | Attending: Internal Medicine | Admitting: Internal Medicine

## 2023-09-23 ENCOUNTER — Ambulatory Visit: Payer: BC Managed Care – PPO

## 2023-09-23 DIAGNOSIS — C221 Intrahepatic bile duct carcinoma: Secondary | ICD-10-CM | POA: Diagnosis not present

## 2023-09-23 DIAGNOSIS — I639 Cerebral infarction, unspecified: Secondary | ICD-10-CM

## 2023-09-23 DIAGNOSIS — D6861 Antiphospholipid syndrome: Secondary | ICD-10-CM | POA: Diagnosis not present

## 2023-09-23 DIAGNOSIS — I10 Essential (primary) hypertension: Secondary | ICD-10-CM

## 2023-09-23 DIAGNOSIS — I63411 Cerebral infarction due to embolism of right middle cerebral artery: Secondary | ICD-10-CM

## 2023-09-23 DIAGNOSIS — I6389 Other cerebral infarction: Secondary | ICD-10-CM

## 2023-09-23 DIAGNOSIS — I483 Typical atrial flutter: Secondary | ICD-10-CM | POA: Diagnosis not present

## 2023-09-23 DIAGNOSIS — D638 Anemia in other chronic diseases classified elsewhere: Secondary | ICD-10-CM | POA: Insufficient documentation

## 2023-09-23 DIAGNOSIS — Z515 Encounter for palliative care: Secondary | ICD-10-CM

## 2023-09-23 DIAGNOSIS — D696 Thrombocytopenia, unspecified: Secondary | ICD-10-CM | POA: Insufficient documentation

## 2023-09-23 DIAGNOSIS — E1169 Type 2 diabetes mellitus with other specified complication: Secondary | ICD-10-CM

## 2023-09-23 DIAGNOSIS — Z7189 Other specified counseling: Secondary | ICD-10-CM

## 2023-09-23 DIAGNOSIS — E785 Hyperlipidemia, unspecified: Secondary | ICD-10-CM

## 2023-09-23 LAB — ECHOCARDIOGRAM COMPLETE
AR max vel: 2.46 cm2
AV Area VTI: 2.76 cm2
AV Area mean vel: 3.01 cm2
AV Mean grad: 6 mm[Hg]
AV Peak grad: 14.9 mm[Hg]
Ao pk vel: 1.93 m/s
Area-P 1/2: 2.47 cm2
Height: 74 in
MV VTI: 3.23 cm2
S' Lateral: 3.5 cm
Weight: 3776 [oz_av]

## 2023-09-23 LAB — GLUCOSE, CAPILLARY
Glucose-Capillary: 126 mg/dL — ABNORMAL HIGH (ref 70–99)
Glucose-Capillary: 143 mg/dL — ABNORMAL HIGH (ref 70–99)
Glucose-Capillary: 157 mg/dL — ABNORMAL HIGH (ref 70–99)
Glucose-Capillary: 128 mg/dL — ABNORMAL HIGH (ref 70–99)

## 2023-09-23 LAB — LIPID PANEL
Cholesterol: 112 mg/dL (ref 0–200)
HDL: 32 mg/dL — ABNORMAL LOW (ref >40)
LDL Cholesterol: 65 mg/dL (ref 0–99)
Total CHOL/HDL Ratio: 3.5 {ratio}
Triglycerides: 77 mg/dL (ref <150)
VLDL: 15 mg/dL (ref 0–40)

## 2023-09-23 LAB — CBC
HCT: 24 % — ABNORMAL LOW (ref 39.0–52.0)
Hemoglobin: 8 g/dL — ABNORMAL LOW (ref 13.0–17.0)
MCH: 30.8 pg (ref 26.0–34.0)
MCHC: 33.3 g/dL (ref 30.0–36.0)
MCV: 92.3 fL (ref 80.0–100.0)
Platelets: 114 10*3/uL — ABNORMAL LOW (ref 150–400)
RBC: 2.6 MIL/uL — ABNORMAL LOW (ref 4.22–5.81)
RDW: 14.8 % (ref 11.5–15.5)
WBC: 6.1 10*3/uL (ref 4.0–10.5)
nRBC: 0 % (ref 0.0–0.2)

## 2023-09-23 LAB — HEPARIN LEVEL (UNFRACTIONATED)
Heparin Unfractionated: >1.1 [IU]/mL — ABNORMAL HIGH (ref 0.30–0.70)
Heparin Unfractionated: 0.94 [IU]/mL — ABNORMAL HIGH (ref 0.30–0.70)

## 2023-09-23 LAB — APTT
aPTT: 45 s — ABNORMAL HIGH (ref 24–36)
aPTT: 64 s — ABNORMAL HIGH (ref 24–36)

## 2023-09-23 MED ORDER — FENTANYL 50 MCG/HR TD PT72: 1.0000 | MEDICATED_PATCH | TRANSDERMAL | Status: DC

## 2023-09-23 MED ORDER — HEPARIN (PORCINE) 25000 UT/250ML-% IV SOLN
1200.0000 [IU]/h | INTRAVENOUS | Status: DC
  Administered 2023-09-23: 900 [IU]/h via INTRAVENOUS
  Administered 2023-09-23: 1100 [IU]/h via INTRAVENOUS
  Filled 2023-09-23: qty 250

## 2023-09-23 MED ORDER — ENSURE ENLIVE PO LIQD: 237.0000 mL | Freq: Two times a day (BID) | ORAL | Status: DC

## 2023-09-23 MED ORDER — OXYCODONE HCL 5 MG PO TABS
5.0000 mg | ORAL_TABLET | ORAL | Status: DC | PRN
  Administered 2023-09-24: 5 mg via ORAL
  Filled 2023-09-23: qty 1

## 2023-09-23 MED ORDER — OXYCODONE HCL 5 MG PO TABS
10.0000 mg | ORAL_TABLET | Freq: Once | ORAL | Status: DC
  Filled 2023-09-23: qty 2

## 2023-09-23 MED ORDER — FENTANYL 50 MCG/HR TD PT72
1.0000 | MEDICATED_PATCH | TRANSDERMAL | Status: DC
  Administered 2023-09-24: 1 via TRANSDERMAL
  Filled 2023-09-23: qty 1

## 2023-09-23 MED ORDER — HEPARIN (PORCINE) 25000 UT/250ML-% IV SOLN: 900.0000 [IU]/h | INTRAVENOUS | Status: DC

## 2023-09-23 NOTE — Consult Note (Signed)
PHARMACY - ANTICOAGULATION CONSULT NOTE  Pharmacy Consult for Heparin Infusion  Indication: APLS with recurrent DVT & atrial fibrillation   Patient Measurements: Height: 6\' 2"  (188 cm) Weight: 107 kg (236 lb) IBW/kg (Calculated) : 82.2 Heparin Dosing Weight: 104 kg  Vital Signs: Temp: 98.3 F (36.8 C) (11/25 0358) BP: 154/79 (11/25 0358) Pulse Rate: 99 (11/25 0358)  Labs: Recent Labs    09/22/23 0850 09/23/23 0542  HGB 9.7* 8.0*  HCT 29.7* 24.0*  PLT 149* 114*  APTT 34  --   LABPROT 19.2*  --   INR 1.6*  --   HEPARINUNFRC  --  >1.10*  CREATININE 0.88  --    Estimated Creatinine Clearance: 110.5 mL/min (by C-G formula based on SCr of 0.88 mg/dL).  Medical History: Past Medical History:  Diagnosis Date   Antiphospholipid antibody syndrome (HCC)    Arthritis    Cancer (HCC)    Diabetes mellitus without complication (HCC)    DVT (deep venous thrombosis) (HCC)    Lt leg   Dyspnea    Elevated lipids    Erythrocytosis    Hyperlipidemia    Hypertension    Lower extremity edema    Multinodular thyroid    Post-thrombotic syndrome    Medications:  PTA Eliquis - last dose taken 11/23 PM at 1900   Assessment: Christopher Marsh is a 62 y.o. male on chronic anticoagulation for antiphospholipid syndrome with recurrent DVTs and atrial fibrillation. PMH is significant for metastatic cholangiocarcinoma. Patient's oncologist recently switched him from warfarin to Eliquis to improve quality of life. Pharmacy has been consulted to initiate and manage heparin infusion.   Baseline Labs: aPTT 34, PT 19.2, INR 1.6, Hgb 9.7, Hct 29.7, Plt 149   Goal of Therapy:  Heparin level 0.3-0.5 units/ml, no boluses  Monitor platelets by anticoagulation protocol: Yes   Plan:  11/25:  HL @ 0542 = >1.10, elevated - Will hold heparin drip for 30 min and restart @ 900 units/hr - Will recheck HL 6 hrs after restart  Continue to monitor H&H and platelets daily while on heparin infusion    Nikki Glanzer D, PharmD Clinical Pharmacist 09/23/2023 7:07 AM

## 2023-09-23 NOTE — Assessment & Plan Note (Signed)
Allowing permissive hypertension.

## 2023-09-23 NOTE — Assessment & Plan Note (Signed)
Paroxysmal in nature.  This morning had fast heart rate and gave a dose of IV metoprolol and oral metoprolol.  Continue metoprolol 50 mg twice daily.  Patient on Lovenox injections.

## 2023-09-23 NOTE — Evaluation (Signed)
Occupational Therapy Evaluation Patient Details Name: Christopher Marsh MRN: [id number] DOB: 05-Aug-1961 Today's Date: 09/23/2023   History of Present Illness Pt admitted to [facility] on 09/22/23 for c/o stroke like symptoms including: L sided weakness resulting in mechanical fall. On Eliquis at baseline and determined not to be candidate for TNK. Imaging significant for R basal ganglia lacunar infarct. Significant PMH includes: antiphospholipid antibody syndrome, paroxysmal atrial fibrillation on Eliquis, diabetes, hypertension, hyperlipidemia, cholangiocarcinoma.   Clinical Impression   Pt was seen for OT evaluation and cotx with PT this date. Prior to hospital admission, pt was using a rollator for mobility and modified indep with ADL tasks. Pt lives with his spouse in [facility] and able to stay on main floor with bed/bath. Spouse and son present and very supportive. Pt presents to acute OT demonstrating impaired ADL performance and functional mobility 2/2 back pain from bone cancer, impaired coordination and strength on L side, balance, and activity tolerance (See OT problem list for additional functional deficits). Pt currently requires MOD A +1 - 2 with bed mobility, MIN A +2 to MOD A +2 for standing EOB with RW and requiring manual facilitation to maintain LUE support through RW. Based on deficits, anticipate pt requires at least MIN-MOD A for seated UB ADL, and MAX A for LB ADL tasks. Pt requires Pt demo's good understanding of deficits and throughout session initiates using non-dominant LUE to complete grooming task requiring some assist and VC to maintain grip of kleenex. Pt motivated to improve and notes he has a very large family who get together for the holidays.  Pt would benefit from high intensity (>3hrs/day) skilled OT services to address noted impairments and functional limitations (see below for any additional details) in order to maximize safety and independence while minimizing falls risk and  caregiver burden.     If plan is discharge home, recommend the following: A lot of help with walking and/or transfers;A lot of help with bathing/dressing/bathroom;Assistance with cooking/housework;Assist for transportation;Help with stairs or ramp for entrance;Assistance with feeding;Direct supervision/assist for medications management    Functional Status Assessment  Patient has had a recent decline in their functional status and demonstrates the ability to make significant improvements in function in a reasonable and predictable amount of time.  Equipment Recommendations  Other (comment) (defer to next venue)    Recommendations for Other Services       Precautions / Restrictions Precautions Precautions: Fall Restrictions Weight Bearing Restrictions: No      Mobility Bed Mobility Overal bed mobility: Needs Assistance Bed Mobility: Supine to Sit, Sit to Supine     Supine to sit: Mod assist, HOB elevated Sit to supine: Mod assist, +2 for physical assistance (HOB flat)   General bed mobility comments: verbal cues for sequncing and technique. increased time and effort required    Transfers Overall transfer level: Needs assistance Equipment used: Rolling walker (2 wheels) Transfers: Sit to/from Stand Sit to Stand: Min assist, +2 physical assistance, Mod assist, From elevated surface           General transfer comment: Min A +2 for initial stand. Mod A +2 for second stand. cues for hand placement and technique to facilitate independence with standing. Manual facilitation to LUE while in standing through all joints and maintain grasp on RW.      Balance Overall balance assessment: Needs assistance Sitting-balance support: Bilateral upper extremity supported, Feet supported Sitting balance-Leahy Scale: Fair Sitting balance - Comments: cues to correct posture to midline with L side  lean more pronounced with increased sitting time, requiring MIN A to correct as pt  fatigued Postural control: Left lateral lean Standing balance support: Bilateral upper extremity supported Standing balance-Leahy Scale: Poor Standing balance comment: L side lean with standing. +2 person required with more pronounced lean with fatigue                           ADL either performed or assessed with clinical judgement   ADL Overall ADL's : Needs assistance/impaired Eating/Feeding: NPO   Grooming: Sitting;Set up;Wash/dry face Grooming Details (indicate cue type and reason): seated EOB, pt set up with kleenex in L hand to wipe his mouth (intermittent drooling on L side of mouth), nose, and occasional tears, required PRN VC to increase attention to grip as pt fatigued and required assist to maintain grasp on kleenex                               General ADL Comments: Anticipate pt requires at least MIN-MOD A for seated UB ADL, and MAX A for LB ADL tasks.     Vision         Perception         Praxis         Pertinent Vitals/Pain Pain Assessment Pain Assessment: 0-10 Pain Score: 6  Pain Location: 6/10 low back and L knee Pain Descriptors / Indicators: Grimacing, Discomfort Pain Intervention(s): Limited activity within patient's tolerance, Monitored during session, Repositioned, Patient requesting pain meds-RN notified     Extremity/Trunk Assessment Upper Extremity Assessment Upper Extremity Assessment: LUE deficits/detail LUE Deficits / Details: shoulder flexion 3-/5; elbow flexion 4/5, elbow extension 5/5. Fluctuating grip strength LUE Sensation: WNL;decreased proprioception LUE Coordination: decreased fine motor;decreased gross motor   Lower Extremity Assessment Lower Extremity Assessment: LLE deficits/detail;Generalized weakness LLE Deficits / Details: grossly 3/5, limited AROM secondary to weakness. LLE Sensation: WNL;decreased proprioception LLE Coordination: decreased gross motor   Cervical / Trunk Assessment Cervical /  Trunk Assessment: Other exceptions Cervical / Trunk Exceptions: hx bone cancer in back   Communication Communication Cueing Techniques: Verbal cues;Tactile cues   Cognition Arousal: Alert Behavior During Therapy: Lability, WFL for tasks assessed/performed Overall Cognitive Status: Within Functional Limits for tasks assessed                                 General Comments: delayed processing with fatigue; increased time/effort for processing, intermittent repetition required for task sequencing and initiation     General Comments       Exercises Other Exercises Other Exercises: Emotional support and active listening provided as pt becomes intermittently tearful with situation.   Shoulder Instructions      Home Living Family/patient expects to be discharged to:: Private residence Living Arrangements: Spouse/significant other Available Help at Discharge: Family;Available 24 hours/day Type of Home: House Home Access: Stairs to enter [employer] of Steps: front entrance 4 STE with bilateral railing (cannot reach both); side entrance with 7 STE, bilateral railing (can reach both). RN states they are having ramp installed at the home on Tuesday.   Home Layout: Two level;Able to live on main level with bedroom/bathroom     Bathroom Shower/Tub: Tub/shower unit (grab bar on lip of tub)   Bathroom Toilet: Handicapped height (BSC over top of toilet for UE assist)     Home Equipment:  Rolling Walker (2 wheels);Rollator (4 wheels);Cane - single point;BSC/3in1          Prior Functioning/Environment Prior Level of Function : History of Falls (last six months)             Mobility Comments: mod I with rollator for limited ambulation; drives sometimes but spouse primarily drives. ADLs Comments: mod I with ADL's, light IADL's, and medication management. Otherwise family assists with IADL's.        OT Problem List: Decreased strength;Decreased  coordination;Pain;Decreased activity tolerance;Impaired balance (sitting and/or standing);Decreased safety awareness;Decreased knowledge of use of DME or AE;Impaired UE functional use      OT Treatment/Interventions: Self-care/ADL training;Therapeutic exercise;Neuromuscular education;Therapeutic activities;DME and/or AE instruction;Patient/family education;Balance training    OT Goals(Current goals can be found in the care plan section) Acute Rehab OT Goals Patient Stated Goal: get better fast OT Goal Formulation: With patient/family Time For Goal Achievement: 10/07/23 Potential to Achieve Goals: Good ADL Goals Pt Will Perform Eating: sitting;with set-up;with contact guard assist (CGA for sitting balance and LUE as pt fatigues) Pt Will Perform Upper Body Dressing: sitting;with contact guard assist Pt Will Transfer to Toilet: with min assist;bedside commode (step pivot with RW) Additional ADL Goal #1: Pt will maintain unsupported static sitting balance for table top ADL tasks for without LOB, PRN VC to correct for L lateral lean, 2/2 opportunities.  OT Frequency: Min 1X/week    Co-evaluation PT/OT/SLP Co-Evaluation/Treatment: Yes Reason for Co-Treatment: To address functional/ADL transfers;For patient/therapist safety PT goals addressed during session: Mobility/safety with mobility;Balance OT goals addressed during session: ADL's and self-care      AM-PAC OT "6 Clicks" Daily Activity     Outcome Measure Help from another person eating meals?: A Little Help from another person taking care of personal grooming?: A Little Help from another person toileting, which includes using toliet, bedpan, or urinal?: A Lot Help from another person bathing (including washing, rinsing, drying)?: A Lot Help from another person to put on and taking off regular upper body clothing?: A Lot Help from another person to put on and taking off regular lower body clothing?: A Lot 6 Click Score: 14   End  of Session Equipment Utilized During Treatment: Gait belt;Rolling walker (2 wheels) Nurse Communication: Patient requests pain meds  Activity Tolerance: Patient tolerated treatment well Patient left: in bed;with call bell/phone within reach;with bed alarm set;with family/visitor present  OT Visit Diagnosis: Hemiplegia and hemiparesis;Pain Hemiplegia - Right/Left: Left Hemiplegia - dominant/non-dominant: Non-Dominant Hemiplegia - caused by: Cerebral infarction Pain - Right/Left:  (back)                Time: 1610-9604 OT Time Calculation (min): 36 min Charges:  OT General Charges $OT Visit: 1 Visit OT Evaluation $OT Eval Moderate Complexity: 1 Mod OT Treatments $Neuromuscular Re-education: 8-22 mins  Arman Filter., MPH, MS, OTR/L ascom [phone] 09/23/23, 10:21 AM

## 2023-09-23 NOTE — Progress Notes (Signed)
Chaplain provided Christopher Marsh, wife and son supportive presence as they processed the bad news regarding Christopher Marsh's prognosis. All were tearful and sad. Chaplain validated emotions. Offered EOL education and ways to connect through grief. Prayer was offered. Christopher Marsh anticipates hospice starting tomorrow.     09/23/23 1700  Spiritual Encounters  Type of Visit Initial  Care provided to: Pt and family  Conversation partners present during encounter Nurse  Reason for visit End-of-life

## 2023-09-23 NOTE — Assessment & Plan Note (Signed)
Sliding scale insulin for now.  Holding oral medications.

## 2023-09-23 NOTE — Progress Notes (Signed)
Progress Note   Patient: Christopher Marsh ZOX:[id number] DOB: 06-Mar-1961 DOA: 09/22/2023     1 DOS: the patient was seen and examined on 09/23/2023   Brief hospital course: 62 year old man with past medical history of stage IV cholangiocarcinoma, antiphospholipid syndrome with hypercoagulable state and recently switched from Coumadin over to Eliquis, paroxysmal atrial fibrillation, hypertension, insulin-dependent diabetes mellitus presented with new onset left-sided weakness.  MRI of the brain shows right MCA scattered infarcts and left PCA scattered infarcts.  Patient did not do well initially with the bedside swallow evaluation.  Patient placed on heparin drip.  11/25.  Patient complaining of back pain.  Consulted palliative care and oncology.  Awaiting speech therapy to see if he does better today with the swallowing.    Assessment and Plan: * Embolic stroke involving right middle cerebral artery (HCC) Causing left-sided weakness.  Patient also has PCA left side embolic stroke.  PT and OT consultations appreciated.  Patient did not do well with the swallow exam initially.  Awaiting speech therapy reevaluation today.  Currently on heparin drip.  Cholangiocarcinoma (HCC) Stage IV.  Metastases to spine.  Oncology and palliative care consultations.  Typical atrial flutter (HCC) Paroxysmal in nature.  Continue anticoagulation.  Antiphospholipid syndrome (HCC) Continue anticoagulation  Essential hypertension Allowing permissive hypertension.  Thrombocytopenia (HCC) Likely from a cancerous process.  Anemia of chronic disease Last hemoglobin 8  Type 2 diabetes mellitus with hyperlipidemia (HCC) Sliding scale insulin for now.  Holding oral medications.        Subjective: Patient came in with left-sided weakness.  Also has back pain.  Found to have stroke.  History of cholangiocarcinoma with metastases to the spine.  Physical Exam: Vitals:   09/22/23 1954 09/22/23 2356 09/23/23  0358 09/23/23 0750  BP: (!) 158/84 (!) 156/70 (!) 154/79 (!) 151/80  Pulse: 95 97 99 93  Resp: 14  18 18   Temp: (!) 97.5 F (36.4 C) 98.3 F (36.8 C) 98.3 F (36.8 C) 97.9 F (36.6 C)  TempSrc:      SpO2: 98% 99% 98% 99%  Weight:      Height:       Physical Exam Cardiovascular:     Rate and Rhythm: Normal rate and regular rhythm.     Heart sounds: Normal heart sounds, S1 normal and S2 normal.  Pulmonary:     Breath sounds: Examination of the right-lower field reveals decreased breath sounds. Examination of the left-lower field reveals decreased breath sounds. Decreased breath sounds present. No wheezing, rhonchi or rales.  Abdominal:     Palpations: Abdomen is soft.     Tenderness: There is no abdominal tenderness.  Musculoskeletal:     Right lower leg: Swelling present.     Left lower leg: Swelling present.  Skin:    General: Skin is warm.     Findings: No rash.  Neurological:     Mental Status: He is alert and oriented to person, place, and time.     Comments: Patient able to straight leg raise bilaterally.     Data Reviewed: MRI is reviewed and dictated results above, creatinine 0.88, LDL 65, hemoglobin 8, platelet count 114 Family Communication: Wife, mother and son at the bedside  Disposition: Status is: Inpatient Remains inpatient appropriate because: Continue heparin drip.  Need to see if it is safe to swallow  Planned Discharge Destination: To be determined    Time spent: 35 minutes Case discussed with oncology  Author: Alford Highland, MD 09/23/2023 11:30 AM  For on call review [email or web address].

## 2023-09-23 NOTE — Consult Note (Signed)
PHARMACY - ANTICOAGULATION CONSULT NOTE  Pharmacy Consult for Heparin Infusion  Indication: APLS with recurrent DVT & atrial fibrillation   Patient Measurements: Height: 6\' 2"  (188 cm) Weight: 107 kg (236 lb) IBW/kg (Calculated) : 82.2 Heparin Dosing Weight: 104 kg  Vital Signs: Temp: 98 F (36.7 C) (11/25 1155) BP: 149/73 (11/25 1155) Pulse Rate: 100 (11/25 1155)  Labs: Recent Labs    09/22/23 0850 09/23/23 0542 09/23/23 1519  HGB 9.7* 8.0*  --   HCT 29.7* 24.0*  --   PLT 149* 114*  --   APTT 34  --  45*  LABPROT 19.2*  --   --   INR 1.6*  --   --   HEPARINUNFRC  --  >1.10* 0.94*  CREATININE 0.88  --   --    Estimated Creatinine Clearance: 110.5 mL/min (by C-G formula based on SCr of 0.88 mg/dL).  Medical History: Past Medical History:  Diagnosis Date   Antiphospholipid antibody syndrome (HCC)    Arthritis    Cancer (HCC)    Diabetes mellitus without complication (HCC)    DVT (deep venous thrombosis) (HCC)    Lt leg   Dyspnea    Elevated lipids    Erythrocytosis    Hyperlipidemia    Hypertension    Lower extremity edema    Multinodular thyroid    Post-thrombotic syndrome    Medications:  PTA Eliquis - last dose taken 11/23 PM at 1900   Assessment: Christopher Marsh is a 62 y.o. male on chronic anticoagulation for antiphospholipid syndrome with recurrent DVTs and atrial fibrillation. PMH is significant for metastatic cholangiocarcinoma. Patient's oncologist recently switched him from warfarin to Eliquis to improve quality of life. Pharmacy has been consulted to initiate and manage heparin infusion.   Baseline Labs: aPTT 34, PT 19.2, INR 1.6, Hgb 9.7, Hct 29.7, Plt 149   Heparin level remains supratherapeutic at 0.92 on 900 units/hour, aPTT level is subtherapeutic at 45 sec. No issues with infusion reported, no signs/symptoms of bleeding noted.  Goal of Therapy:  Heparin level 0.3-0.5 units/ml, no boluses  aPTT level 66-102 seconds Monitor platelets by  anticoagulation protocol: Yes   Plan:  Increase heparin infusion to 1100 units/hour without bolus (per neurology) Recheck 6 hour aPTT level Continue to monitor aPTT levels until correlating with Anti-Xa levels Continue to monitor H&H and platelets daily while on heparin infusion   Rockwell Alexandria, PharmD Clinical Pharmacist 09/23/2023 3:51 PM

## 2023-09-23 NOTE — Hospital Course (Signed)
62 year old man with past medical history of stage IV cholangiocarcinoma, antiphospholipid syndrome with hypercoagulable state and recently switched from Coumadin over to Eliquis, paroxysmal atrial fibrillation, hypertension, insulin-dependent diabetes mellitus presented with new onset left-sided weakness.  MRI of the brain shows right MCA scattered infarcts and left PCA scattered infarcts.  Patient did not do well initially with the bedside swallow evaluation.  Patient placed on heparin drip.  11/25.  Patient complaining of back pain.  Consulted palliative care and oncology.  Awaiting speech therapy to see if he does better today with the swallowing. 11/26.  Patient and family decided to go home with hospice.  Equipment set up.  Heparin drip switched over to Lovenox injections as per oncology.  Switched oxycodone over to Roxanol.  Patient already on Duragesic patch.

## 2023-09-23 NOTE — Assessment & Plan Note (Signed)
Last hemoglobin 8.2

## 2023-09-23 NOTE — Assessment & Plan Note (Signed)
Continue anticoagulation

## 2023-09-23 NOTE — Plan of Care (Signed)

## 2023-09-23 NOTE — Assessment & Plan Note (Signed)
Likely from a cancerous process.

## 2023-09-23 NOTE — Progress Notes (Signed)
Speech Language Pathology Treatment: Dysphagia  Patient Details Name: Christopher Marsh MRN: [id number] DOB: [birth year] Today's Date: 09/23/2023 Time: 4098-1191 SLP Time Calculation (min) (ACUTE ONLY): 55 min  Assessment / Plan / Recommendation Clinical Impression  Pt seen for ongoing assessment of swallowing today. Pt awake, verbal and engaging w/ Family members present, this SLP. At end of session, Christopher Marsh wanted to address an email to be sent (w/ Wife assisting). Noted L OM weakness; mild Dysarthria -- speech intelligibility 90-100%. Noted min Impulsivity, decreased awareness during tasks requiring min+ verbal/tactile cues for follow through w/ precautions during oral intake.  On RA, afebrile. WBC WNL.  Pt appears to present w/ oropharyngeal phase dysphagia in setting of acute R MCA, Left OM weakness. Pt appears to present w/ min decreased awareness/judgement during po tasks also. This overall presentation increases risk for aspiration/aspiration pneumonia.  Pt's risk for aspiration can be reduced when following aspiration precautions and using a modified Dysphagia diet w/ Nectar consistency liquids. Christopher Marsh requires Full Supervision during oral intake w/ tactile/verbal cues and feeding support as needed.   Pt consumed trials of purees, then tsp/cup sips of Nectar liquids w/ No immediate, overt clinical s/s of aspiration noted; no decline in vocal quality, no cough, and no decline in respiratory status during/post trials. Mild throat clearing noted x1-2 during all trials given this session. Oral phase was grossly [facility] for bolus management, A-P transfer time, and oral clearing of the boluses given, BUT, slight-min L labial leakage noted during oral intake of bolus consistencies, moreso liquid. W/ cue, Christopher Marsh completed wiping of mouth.  Thin liquids and solids were not assessed this session.   Thorough Education given on: dysphagia; aspiration precautions; aspiration/aspiration pneumonia risk; food/liquid  consistencies rec'd currently. Supervision at meals to monitor follow through w/ feeding and swallowing. Pills CRUSHED in Puree. Handout given on general precautions discussed. Questions answered.   Recommend initiation of a Dysphagia level 1(puree) w/ Nectar liquids by tsp/cup; aspiration precautions; reduce Distractions/Talking during meals. 100% Supervision, support w/ feeding at meals. Pills Crushed in Puree for safer swallowing. NSG/MD updated. ST services will continue to follow for ongoing assessment of swallowing and toleration of diet; trials to upgrade diet consistency as appropriate/safe for pt.   Noted Palliative Care came to visit pt during session; Baseline stage IV cholangiocarcinoma.       HPI HPI: Pt admitted to [facility] on 09/22/23 for c/o stroke like symptoms including: L sided weakness resulting in mechanical fall. On Eliquis at baseline and determined not to be candidate for TNK.  Significant PMH includes: stage IV cholangiocarcinoma - tx progression and has not responded well to tx per chart note, antiphospholipid antibody syndrome, DVT, DM, paroxysmal atrial fibrillation on Eliquis, diabetes, hypertension, hyperlipidemia, LE edema.  Head CT, "1. Age indeterminate anterior right basal ganglia lacunar infarct, but might have been present on February PET-CT.  2. No acute cortically based infarct or acute intracranial  hemorrhage identified. ASPECTS 10.  3. Advanced calcified atherosclerosis.".  MRI findings - Positive for scattered small acute infarcts in the Right MCA territory, from the posterior right external capsule, through the corona radiata and to the postcentral gyrus. Additional superimposed small contralateral left anterior frontal lobe white matter lacunar infarct. No associated hemorrhage or mass effect.       SLP Plan  Continue with current plan of care      Recommendations for follow up therapy are one component of a multi-disciplinary discharge planning process,  led by the attending physician.  Recommendations may  be updated based on patient status, additional functional criteria and insurance authorization.    Recommendations  Diet recommendations: Dysphagia 1 (puree);Nectar-thick liquid Liquids provided via: Cup;No straw Medication Administration: Crushed with puree Supervision: Patient able to self feed;Staff to assist with self feeding;Full supervision/cueing for compensatory strategies Compensations: Minimize environmental distractions;Slow rate;Small sips/bites;Lingual sweep for clearance of pocketing;Multiple dry swallows after each bite/sip;Follow solids with liquid Postural Changes and/or Swallow Maneuvers: Out of bed for meals;Seated upright 90 degrees;Upright 30-60 min after meal                 (Rehab consult at d/c) Oral care BID;Oral care before and after PO;Staff/trained caregiver to provide oral care;Patient independent with oral care (support pt)   Frequent or constant Supervision/Assistance (min impulsive; decreased awareness) Dysphagia, oropharyngeal phase (R13.12) (R MCA; chronic illness)     Continue with current plan of care       Jerilynn Som, MS, CCC-SLP Speech Language Pathologist Rehab Services; [facility] [place] [phone] (ascom) Ivadell Gaul  09/23/2023, 4:58 PM

## 2023-09-23 NOTE — Progress Notes (Signed)
Physical Therapy Treatment Patient Details Name: Christopher Marsh MRN: [id number] DOB: 11-06-1960 Today's Date: 09/23/2023   History of Present Illness Pt admitted to [facility] on 09/22/23 for c/o stroke like symptoms including: L sided weakness resulting in mechanical fall. On Eliquis at baseline and determined not to be candidate for TNK. Imaging significant for R basal ganglia lacunar infarct. Significant PMH includes: antiphospholipid antibody syndrome, paroxysmal atrial fibrillation on Eliquis, diabetes, hypertension, hyperlipidemia, cholangiocarcinoma.    PT Comments  Patient is agreeable to PT session. Supportive spouse and son at the bedside. Patient continues to have left side weakness, requiring assistance with mobility. L side lean is more pronounced with fatigue in sitting and with standing. Min A +2 for initial stand with Mod A +2 for second stand. Pre-gait activity performed with standing tolerance of around 2 minutes x 2 bouts. Anticipate patient will need intensive rehabilitation >3 hours/day after this hospital stay.    If plan is discharge home, recommend the following: Two people to help with walking and/or transfers;Two people to help with bathing/dressing/bathroom;Direct supervision/assist for medications management;Assist for transportation;Assistance with cooking/housework;Help with stairs or ramp for entrance   Can travel by private vehicle        Equipment Recommendations   (to be determined at next level of care)    Recommendations for Other Services Rehab consult     Precautions / Restrictions Precautions Precautions: Fall Restrictions Weight Bearing Restrictions: No     Mobility  Bed Mobility Overal bed mobility: Needs Assistance Bed Mobility: Supine to Sit, Sit to Supine     Supine to sit: Mod assist, HOB elevated Sit to supine: Mod assist, +2 for physical assistance (HOB flat)   General bed mobility comments: verbal cues for sequncing and technique.  increased time and effort required    Transfers Overall transfer level: Needs assistance Equipment used: Rolling walker (2 wheels) Transfers: Sit to/from Stand Sit to Stand: Min assist, +2 physical assistance, Mod assist, From elevated surface           General transfer comment: Min A +2 for initial stand. Mod A +2 for second stand. cues for hand placement and technique to facilitate independence with standing    Ambulation/Gait             Pre-gait activities: emphasis on upright standing posture, facilitation provided to weight shift to the right. patient is able to initiate movement to take a small step to the side with LLE. Min A +2 initially, progressing to Mod A +2 with fatighue with more pronounced L side lean. standing tolerance of around 2 minutes x 2 bouts     Stairs             Wheelchair Mobility     Tilt Bed    Modified Rankin (Stroke Patients Only)       Balance Overall balance assessment: Needs assistance Sitting-balance support: Bilateral upper extremity supported, Feet supported Sitting balance-Leahy Scale: Fair Sitting balance - Comments: cues to correct posture to midline with L side lean more pronounced with increased sitting time Postural control: Left lateral lean Standing balance support: Bilateral upper extremity supported Standing balance-Leahy Scale: Poor Standing balance comment: L side lean with standing. +2 person requried with more pronounced lean with fatigue                            Cognition Arousal: Alert Behavior During Therapy: Lability, WFL for tasks assessed/performed Overall Cognitive Status: Within Functional  Limits for tasks assessed                                 General Comments: delayed processing with fatigue; increased time/effort for processing, intermittent repetition required for task sequencing and initiation        Exercises      General Comments        Pertinent  Vitals/Pain Pain Assessment Pain Assessment: 0-10 Pain Score: 6  Pain Location: 6/10 low back and L knee Pain Descriptors / Indicators: Grimacing, Discomfort Pain Intervention(s): RN gave pain meds during session, Patient requesting pain meds-RN notified, Repositioned, Limited activity within patient's tolerance, Monitored during session    Home Living                          Prior Function            PT Goals (current goals can now be found in the care plan section) Acute Rehab PT Goals Patient Stated Goal: to return home PT Goal Formulation: With patient/family Time For Goal Achievement: 10/06/23 Potential to Achieve Goals: Fair Progress towards PT goals: Progressing toward goals    Frequency    Min 1X/week      PT Plan      Co-evaluation              AM-PAC PT "6 Clicks" Mobility   Outcome Measure  Help needed turning from your back to your side while in a flat bed without using bedrails?: A Lot Help needed moving from lying on your back to sitting on the side of a flat bed without using bedrails?: A Lot Help needed moving to and from a bed to a chair (including a wheelchair)?: Total Help needed standing up from a chair using your arms (e.g., wheelchair or bedside chair)?: Total Help needed to walk in hospital room?: Total Help needed climbing 3-5 steps with a railing? : Total 6 Click Score: 8    End of Session         PT Visit Diagnosis: Unsteadiness on feet (R26.81);Repeated falls (R29.6);Muscle weakness (generalized) (M62.81);Hemiplegia and hemiparesis     Time: 6962-9528 PT Time Calculation (min) (ACUTE ONLY): 36 min  Charges:    $Therapeutic Activity: 8-22 mins PT General Charges $$ ACUTE PT VISIT: 1 Visit                     Donna Bernard, PT, MPT    Ina Homes 09/23/2023, 9:26 AM

## 2023-09-23 NOTE — Consult Note (Signed)
PHARMACY - ANTICOAGULATION CONSULT NOTE  Pharmacy Consult for Heparin Infusion  Indication: APLS with recurrent DVT & atrial fibrillation   Patient Measurements: Height: 6\' 2"  (188 cm) Weight: 107 kg (236 lb) IBW/kg (Calculated) : 82.2 Heparin Dosing Weight: 104 kg  Vital Signs: Temp: 99.4 F (37.4 C) (11/25 1952) BP: 135/86 (11/25 1952) Pulse Rate: 90 (11/25 1952)  Labs: Recent Labs    09/22/23 0850 09/23/23 0542 09/23/23 1519 09/23/23 2305  HGB 9.7* 8.0*  --   --   HCT 29.7* 24.0*  --   --   PLT 149* 114*  --   --   APTT 34  --  45* 64*  LABPROT 19.2*  --   --   --   INR 1.6*  --   --   --   HEPARINUNFRC  --  >1.10* 0.94*  --   CREATININE 0.88  --   --   --    Estimated Creatinine Clearance: 110.5 mL/min (by C-G formula based on SCr of 0.88 mg/dL).  Medical History: Past Medical History:  Diagnosis Date   Antiphospholipid antibody syndrome (HCC)    Arthritis    Cancer (HCC)    Diabetes mellitus without complication (HCC)    DVT (deep venous thrombosis) (HCC)    Lt leg   Dyspnea    Elevated lipids    Erythrocytosis    Hyperlipidemia    Hypertension    Lower extremity edema    Multinodular thyroid    Post-thrombotic syndrome    Medications:  PTA Eliquis - last dose taken 11/23 PM at 1900   Assessment: Christopher Marsh is a 62 y.o. male on chronic anticoagulation for antiphospholipid syndrome with recurrent DVTs and atrial fibrillation. PMH is significant for metastatic cholangiocarcinoma. Patient's oncologist recently switched him from warfarin to Eliquis to improve quality of life. Pharmacy has been consulted to initiate and manage heparin infusion.   Baseline Labs: aPTT 34, PT 19.2, INR 1.6, Hgb 9.7, Hct 29.7, Plt 149   Heparin level remains supratherapeutic at 0.92 on 900 units/hour, aPTT level is subtherapeutic at 45 sec. No issues with infusion reported, no signs/symptoms of bleeding noted.  Goal of Therapy:  Heparin level 0.3-0.5 units/ml, no  boluses  aPTT level 66-102 seconds Monitor platelets by anticoagulation protocol: Yes   Plan:  Increase heparin infusion to 1200 units/hour without bolus (per neurology) Recheck aPTT level w/ AM labs after rate change Continue to monitor aPTT levels until correlating with Anti-Xa levels Continue to monitor H&H and platelets daily while on heparin infusion   Otelia Sergeant, PharmD, [facility] 09/23/2023 11:35 PM

## 2023-09-23 NOTE — Consult Note (Signed)
Hematology/Oncology Consult note [phone] [phone]      Patient Care Team: Nelwyn Salisbury, MD as PCP - General (Family Medicine) Mariah Milling Tollie Pizza, MD as PCP - Cardiology (Cardiology) [name], MD as Consulting Physician (Hematology and Oncology) Georgiana Spinner, NP as Nurse Practitioner (Vascular Surgery) Benita Gutter, RN as Oncology Nurse Navigator Carmina Miller, MD as Consulting Physician (Radiation Oncology)   Name of the patient: Christopher Marsh  [id number]  06-03-1961   REASON FOR COSULTATION:   History of presenting illness-  62 y.o. male with PMH listed at below who presents to ER due to new onset of left-sided weakness involving her extremities in the left lower extremities. Patient has antiphospholipid syndrome previously on Coumadin, recently was switched to Eliquis due to fluctuating INR and to improve life quality. 09/22/2023, CT stroke showed age-indeterminate anterior right basal ganglia lacunar infarct. 09/22/2023, MRI brain without contrast showed scattered small acute infarcts in the right MCA territory, from the posterior right external capsule, through the corona radiata and to the postcentral gyrus.  Additional superimposed small contralateral left anterior frontal lobe white matter lacunar infarct.  Subtle chronic left PCA territory infarcts in the occipital pole  CTA does show some evidence of significant diminutive circulation in the platelets branches and asymmetric absence of right MCA M2 branch compatible with left-sided deficit.  Patient was evaluated by neurology.  Given his advanced cholangiocarcinoma, he was felt to be not a candidate for catheter-based intervention. Anticoagulation was switched to heparin gtt.  Oncology was consulted for further evaluation and management.  Patient is known to me for stage IV cholangiocarcinoma, most recent CT chest abdomen pelvis showed progressive osseous metastasis with possible T9  lesion with epidural component.  Outpatient thoracic MRI has not been done due to insurance issue.  Stable size of hepatic mass.  Increased ascites.  Patient was empirically started on dexamethasone over the weekend.  Patient has felt weak, decreased oral intake.  He has lost more weight.  Wife and son are at the bedside. Patient denies any pain currently.   No Known Allergies  Patient Active Problem List   Diagnosis Date Noted   Cholangiocarcinoma (HCC) 08/27/2022    Priority: High   Hypotension 09/09/2023    Priority: Medium    Frequent falls 06/17/2023    Priority: Medium    Weight loss 05/20/2023    Priority: Medium    Atrial fibrillation (HCC) 02/20/2023    Priority: Medium    Hypokalemia 02/13/2023    Priority: Medium    Metastasis to bone (HCC) 12/12/2022    Priority: Medium    Hypocalcemia 10/10/2022    Priority: Medium    Antiphospholipid syndrome (HCC) 08/30/2022    Priority: Medium    Right knee pain 06/03/2023    Priority: Low   Radiation dermatitis 05/20/2023    Priority: Low   Chemotherapy induced diarrhea 03/27/2023    Priority: Low   Constipation 03/12/2023    Priority: Low   Hyperphosphatemia 03/12/2023    Priority: Low   Tachycardia 02/13/2023    Priority: Low   Antineoplastic chemotherapy induced anemia 01/23/2023    Priority: Low   Extravasation accident 01/02/2023    Priority: Low   Hypomagnesemia 01/02/2023    Priority: Low   Sore throat 11/21/2022    Priority: Low   Rhinitis 11/21/2022    Priority: Low   Hyperkalemia 11/21/2022    Priority: Low   Lung infiltrate on CT 10/10/2022    Priority: Low  Goals of care, counseling/discussion 08/30/2022    Priority: Low   Embolic stroke involving right middle cerebral artery (HCC) 09/23/2023   Anemia of chronic disease 09/23/2023   Thrombocytopenia (HCC) 09/23/2023   Palliative care encounter 09/23/2023   Persistent atrial fibrillation (HCC) 04/11/2023   History of DVT (deep vein thrombosis)  04/11/2023   Pleural effusion 02/14/2023   Acute diastolic CHF (congestive heart failure) (HCC) 02/14/2023   Typical atrial flutter (HCC) 02/13/2023   DVT (deep venous thrombosis) (HCC) 02/13/2023   Obesity (BMI 30-39.9) 02/13/2023   Diabetes mellitus without complication (HCC) 02/13/2023   Macrocytic anemia 02/13/2023   SOB (shortness of breath) 02/13/2023   Encounter for antineoplastic chemotherapy 10/10/2022   Multifocal pneumonia 08/19/2022   Hepatic lesion 08/19/2022   Snoring 08/19/2022   Primary insomnia 08/19/2022   Liver mass, right lobe 08/19/2022   Pneumonia 08/18/2022   Lymphedema 04/20/2022   Erythrocytosis 12/18/2021   COVID-19 virus infection 09/01/2020   Status post total replacement of hip 07/22/2017   Essential hypertension 08/28/2016   Actinic keratosis 11/30/2015   Type 2 diabetes mellitus with hyperlipidemia (HCC) 02/15/2015   Morbid obesity (HCC) 06/18/2013   Left hip pain 04/02/2012   HSV (herpes simplex virus) infection 04/02/2012   CONTACT DERMATITIS&OTHER ECZEMA DUE UNSPEC CAUSE 09/14/2009   ERECTILE DYSFUNCTION 12/08/2008   Venous (peripheral) insufficiency 12/08/2008   Hyperlipemia 11/24/2008   ESOPHAGITIS, REFLUX 11/18/2008     Past Medical History:  Diagnosis Date   Antiphospholipid antibody syndrome (HCC)    Arthritis    Cancer (HCC)    Diabetes mellitus without complication (HCC)    DVT (deep venous thrombosis) (HCC)    Lt leg   Dyspnea    Elevated lipids    Erythrocytosis    Hyperlipidemia    Hypertension    Lower extremity edema    Multinodular thyroid    Post-thrombotic syndrome      Past Surgical History:  Procedure Laterality Date   CARDIOVERSION N/A 03/22/2023   Procedure: CARDIOVERSION;  Surgeon: Antonieta Iba, MD;  Location: [facility] ORS;  Service: Cardiovascular;  Laterality: N/A;   COLONOSCOPY WITH PROPOFOL N/A 09/23/2018   Per Dr. Norma Fredrickson, polyp, repeat in 3 yrs    EYE MUSCLE SURGERY     JOINT REPLACEMENT Left  07/22/2017   PORTA CATH INSERTION N/A 12/24/2022   Procedure: PORTA CATH INSERTION;  Surgeon: Annice Needy, MD;  Location: [facility];  Service: Cardiovascular;  Laterality: N/A;   TOTAL HIP ARTHROPLASTY Left 07/22/2017   Procedure: TOTAL HIP ARTHROPLASTY;  Surgeon: Donato Heinz, MD;  Location: [facility] ORS;  Service: Orthopedics;  Laterality: Left;    Social History   Socioeconomic History   Marital status: Married    Spouse name: Not on file   Number of children: Not on file   Years of education: Not on file   Highest education level: Not on file  Occupational History   Not on file  Tobacco Use   Smoking status: Former    Current packs/day: 0.00    Average packs/day: 0.5 packs/day for 6.0 years (3.0 ttl pk-yrs)    Types: Cigarettes    Start date: 18    Quit date: 1    Years since quitting: 42.9   Smokeless tobacco: Never   Tobacco comments:    Pt states when he did smoke he smoked at most 1 ppd. ALS 09/26/2022  Vaping Use   Vaping status: Never Used  Substance and Sexual Activity   Alcohol  use: Not Currently    Comment: occ   Drug use: No   Sexual activity: Not on file  Other Topics Concern   Not on file  Social History Narrative   Not on file   Social Determinants of Health   Financial Resource Strain: Low Risk  (08/27/2022)   Overall Financial Resource Strain (CARDIA)    Difficulty of Paying Living Expenses: Not very hard  Food Insecurity: No Food Insecurity (09/22/2023)   Hunger Vital Sign    Worried About Running Out of Food in the Last Year: Never true    Ran Out of Food in the Last Year: Never true  Transportation Needs: No Transportation Needs (09/22/2023)   PRAPARE - Administrator, Civil Service (Medical): No    Lack of Transportation (Non-Medical): No  Physical Activity: Insufficiently Active (08/27/2022)   Exercise Vital Sign    Days of Exercise per Week: 3 days    Minutes of Exercise per Session: 30 min  Stress: Stress  Concern Present (08/27/2022)   [employer] of Occupational Health - Occupational Stress Questionnaire    Feeling of Stress : Rather much  Social Connections: Socially Integrated (08/27/2022)   Social Connection and Isolation Panel [NHANES]    Frequency of Communication with Friends and Family: More than three times a week    Frequency of Social Gatherings with Friends and Family: More than three times a week    Attends Religious Services: 1 to 4 times per year    Active Member of [employer] or Organizations: Yes    Attends Banker Meetings: Never    Marital Status: Married  Catering manager Violence: Not At Risk (09/22/2023)   Humiliation, Afraid, Rape, and Kick questionnaire    Fear of Current or Ex-Partner: No    Emotionally Abused: No    Physically Abused: No    Sexually Abused: No     Family History  Problem Relation Age of Onset   Breast cancer Mother    Emphysema Father    Colon cancer Maternal Grandmother      Current Facility-Administered Medications:    acetaminophen (TYLENOL) tablet 650 mg, 650 mg, Oral, Q4H PRN **OR** acetaminophen (TYLENOL) 160 MG/5ML solution 650 mg, 650 mg, Per Tube, Q4H PRN **OR** acetaminophen (TYLENOL) suppository 650 mg, 650 mg, Rectal, Q4H PRN, Mikey College T, MD   albuterol (PROVENTIL) (2.5 MG/3ML) 0.083% nebulizer solution 2.5 mg, 2.5 mg, Inhalation, Q6H PRN, Mikey College T, MD   dexamethasone (DECADRON) injection 8 mg, 8 mg, Intravenous, Daily, Chipper Herb, Ping T, MD, 8 mg at 09/23/23 0844   diphenoxylate-atropine (LOMOTIL) 2.5-0.025 MG per tablet 1 tablet, 1 tablet, Oral, QID PRN, Mikey College T, MD   heparin ADULT infusion 100 units/mL (25000 units/231mL), 900 Units/hr, Intravenous, Continuous, Wieting, Richard, MD, Last Rate: 9 mL/hr at 09/23/23 0735, 900 Units/hr at 09/23/23 0735   hydrALAZINE (APRESOLINE) injection 5 mg, 5 mg, Intravenous, Q6H PRN, Mikey College T, MD   HYDROmorphone (DILAUDID) injection 0.5 mg, 0.5 mg, Intravenous,  Q4H PRN, Chipper Herb, Ping T, MD, 0.5 mg at 09/23/23 0845   insulin aspart (novoLOG) injection 0-15 Units, 0-15 Units, Subcutaneous, TID WC, Mikey College T, MD, 2 Units at 09/23/23 1350   insulin aspart (novoLOG) injection 0-5 Units, 0-5 Units, Subcutaneous, QHS, Zhang, Ping T, MD   lactulose (CHRONULAC) 10 GM/15ML solution 20 g, 20 g, Oral, Daily PRN, Mikey College T, MD   LORazepam (ATIVAN) injection 0.5 mg, 0.5 mg, Intravenous, Q6H PRN, Emeline General, MD  LORazepam (ATIVAN) tablet 0.5 mg, 0.5 mg, Oral, Q8H PRN, Mikey College T, MD   ondansetron [facility]) injection 4 mg, 4 mg, Intravenous, Q6H PRN, Mikey College T, MD   pravastatin (PRAVACHOL) tablet 20 mg, 20 mg, Oral, Q2000, Mikey College T, MD   sodium chloride flush (NS) 0.9 % injection 10 mL, 10 mL, Intravenous, Q12H, Mikey College T, MD, 10 mL at 09/23/23 0847   traZODone (DESYREL) tablet 50 mg, 50 mg, Oral, QHS PRN, Mikey College T, MD   triamcinolone cream (KENALOG) 0.1 % cream 1 Application, 1 Application, Topical, TID, Emeline General, MD, 1 Application at 09/22/23 2247  Facility-Administered Medications Ordered in Other Encounters:    sodium chloride flush (NS) 0.9 % injection 10 mL, 10 mL, Intravenous, PRN, [name], MD, 10 mL at 07/18/23 0951  Review of Systems  Constitutional:  Positive for appetite change, fatigue and unexpected weight change. Negative for chills and fever.  HENT:   Negative for voice change.   Eyes:  Negative for eye problems and icterus.  Respiratory:  Negative for chest tightness, cough and shortness of breath.   Cardiovascular:  Positive for leg swelling. Negative for chest pain.  Gastrointestinal:  Negative for abdominal distention and abdominal pain.  Endocrine: Negative for hot flashes.  Genitourinary:  Negative for difficulty urinating, dysuria and frequency.   Musculoskeletal:  Negative for arthralgias.  Skin:  Negative for itching and rash.  Neurological:  Positive for extremity weakness. Negative for light-headedness  and numbness.  Hematological:  Negative for adenopathy. Does not bruise/bleed easily.  Psychiatric/Behavioral:  Negative for confusion.     PHYSICAL EXAM Vitals:   09/22/23 2356 09/23/23 0358 09/23/23 0750 09/23/23 1155  BP: (!) 156/70 (!) 154/79 (!) 151/80 (!) 149/73  Pulse: 97 99 93 100  Resp:  18 18 18   Temp: 98.3 F (36.8 C) 98.3 F (36.8 C) 97.9 F (36.6 C) 98 F (36.7 C)  TempSrc:      SpO2: 99% 98% 99% 99%  Weight:      Height:       Physical Exam Constitutional:      General: He is not in acute distress.    Appearance: He is not diaphoretic.  HENT:     Head: Normocephalic and atraumatic.  Eyes:     General: No scleral icterus. Cardiovascular:     Rate and Rhythm: Normal rate and regular rhythm.  Pulmonary:     Effort: Pulmonary effort is normal. No respiratory distress.  Abdominal:     General: Bowel sounds are normal. There is no distension.     Palpations: Abdomen is soft.  Musculoskeletal:        General: Normal range of motion.     Cervical back: Normal range of motion and neck supple.     Right lower leg: Edema present.     Left lower leg: Edema present.     Comments: Decreased left upper extremity strength.  Decreased bilateral lower extremity strength, left lower extremity weakness is worse than baseline.   Skin:    Findings: No erythema.  Neurological:     Mental Status: He is alert and oriented to person, place, and time. Mental status is at baseline.     Motor: No abnormal muscle tone.  Psychiatric:        Mood and Affect: Affect normal.       LABORATORY STUDIES    Latest Ref Rng & Units 09/23/2023    5:42 AM 09/22/2023    8:50 AM  09/09/2023    8:43 AM  CBC  WBC 4.0 - 10.5 K/uL 6.1  9.3  5.1   Hemoglobin 13.0 - 17.0 g/dL 8.0  9.7  8.5   Hematocrit 39.0 - 52.0 % 24.0  29.7  26.2   Platelets 150 - 400 K/uL 114  149  119       Latest Ref Rng & Units 09/22/2023    8:50 AM 09/12/2023    8:31 AM 09/09/2023    8:43 AM  CMP  Glucose 70  - 99 mg/dL 629  528  413   BUN 8 - 23 mg/dL 12  8  11    Creatinine 0.61 - 1.24 mg/dL 2.44  0.10  2.72   Sodium 135 - 145 mmol/L 135  133  132   Potassium 3.5 - 5.1 mmol/L 3.7  3.5  2.7   Chloride 98 - 111 mmol/L 102  101  98   CO2 22 - 32 mmol/L 21  23  24    Calcium 8.9 - 10.3 mg/dL 9.4  8.3  7.9   Total Protein 6.5 - 8.1 g/dL 6.7  5.9  5.6   Total Bilirubin <1.2 mg/dL 0.8  1.1  1.4   Alkaline Phos 38 - 126 U/L 118  99  97   AST 15 - 41 U/L 24  25  22    ALT 0 - 44 U/L 13  12  13       RADIOGRAPHIC STUDIES: I have personally reviewed the radiological images as listed and agreed with the findings in the report. MR BRAIN WO CONTRAST  Result Date: 09/22/2023 CLINICAL DATA:  62 year old male with metastatic cholangiocarcinoma, code stroke presentation with left side deficit and abnormal appearance of right MCA branches on CTA. EXAM: MRI HEAD WITHOUT CONTRAST TECHNIQUE: Multiplanar, multiecho pulse sequences of the brain and surrounding structures were obtained without intravenous contrast. COMPARISON:  CT head, CTA head and neck earlier today. FINDINGS: Brain: On DWI there is faint abnormal diffusion signal in the right corona radiata, patchy involvement of the right postcentral gyrus (series 5, images 77-83). Faint involvement of the posterior limb external capsule also. But also punctate areas of contralateral anterior left frontal lobe subcortical white matter restricted diffusion also (series 5, image 80). Elsewhere diffusion appears to remain normal. Minimal T2 and FLAIR hyperintensity in the affected areas. No evidence of hemorrhage or mass effect. Superimposed chronic lacunar infarct of the right caudate corresponding to the plain CT finding today (series 6, image 24). No superimposed midline shift, mass effect, evidence of mass lesion, ventriculomegaly, extra-axial collection or acute intracranial hemorrhage. Cervicomedullary junction and pituitary are within normal limits. Background gray and  white matter signal is largely normal for age, however, there is subtle evidence of cortical encephalomalacia in the left occipital lobe series 9, image 24. Chronic microhemorrhage also noted in the pontomedullary junction series 10, image 14. Vascular: Major intracranial vascular flow voids are preserved. Skull and upper cervical spine: Visible bone marrow signal and cervical spine appear negative. Sinuses/Orbits: Negative orbits.  Right maxillary sinus disease. Other: Mastoids are clear. Grossly normal visible internal auditory structures. Negative visible scalp and face. IMPRESSION: 1. Positive for scattered small acute infarcts in the Right MCA territory, from the posterior right external capsule, through the corona radiata and to the postcentral gyrus. Additional superimposed small contralateral left anterior frontal lobe white matter lacunar infarct. No associated hemorrhage or mass effect. 2. Subtle chronic left PCA territory infarct at the occipital pole. Otherwise mild for age underlying chronic  small vessel disease including at the right caudate, brainstem. Electronically Signed   By: Odessa Fleming M.D.   On: 09/22/2023 13:09   CT ANGIO HEAD NECK W WO CM (CODE STROKE)  Result Date: 09/22/2023 CLINICAL DATA:  Code stroke. 62 year old male left side deficit. Metastatic cholangiocarcinoma. EXAM: CT ANGIOGRAPHY HEAD AND NECK TECHNIQUE: Multidetector CT imaging of the head and neck was performed using the standard protocol during bolus administration of intravenous contrast. Multiplanar CT image reconstructions and MIPs were obtained to evaluate the vascular anatomy. Carotid stenosis measurements (when applicable) are obtained utilizing NASCET criteria, using the distal internal carotid diameter as the denominator. RADIATION DOSE REDUCTION: This exam was performed according to the departmental dose-optimization program which includes automated exposure control, adjustment of the mA and/or kV according to patient  size and/or use of iterative reconstruction technique. CONTRAST:  75mL OMNIPAQUE IOHEXOL 350 MG/ML SOLN COMPARISON:  Head CT today.  Thoracic MRI 07/18/2023. FINDINGS: CTA NECK Skeleton: Destructive thoracic vertebral metastasis at T4 redemonstrated. Epidural and extraosseous tumor there asymmetric to the right (series 5, image 190). T4 vertebral height appears stable since the September MRI. Upper chest: Right chest Port-A-Cath. Mediastinal lipomatosis. No superior mediastinal lymphadenopathy. No upper lung metastatic disease. Other neck: Heterogeneously enlarged left thyroid This has been evaluated on previous imaging. (ref: J Am Coll Radiol. 2015 Feb;12(2): 143-50).No other neck mass or lymphadenopathy. Aortic arch: 3 vessel arch.  Calcified aortic atherosclerosis. Right carotid system: Brachiocephalic artery and right CCA origin without significant plaque or stenosis. Mild calcified plaque at the right ICA origin and bulb. Right ICA is patent to the skull base without stenosis. Left carotid system: Similar mild left ICA origin atherosclerosis without stenosis. Vertebral arteries: Minimal proximal right subclavian plaque without stenosis. Normal right vertebral artery origin. Right vertebral artery is patent in the neck and to the skull base with no significant stenosis. Minimal proximal left subclavian artery plaque. Calcified atherosclerosis at the left vertebral artery origin with only mild stenosis on series 7, image 321. Codominant left vertebral artery is patent to the skull base with no additional plaque or stenosis. CTA HEAD Posterior circulation: Heavily calcified bilateral vertebral artery V4 segments on series 7, image 155 with mild to moderate bilateral stenosis. Distal vertebrals and vertebrobasilar junction remain patent. Patent basilar artery without stenosis. Patent although diminutive SCA and PCA origins. Posterior communicating arteries are diminutive or absent. Bilateral PCA branches appear  diminutive (series 8, image 21) but seem to remain patent. Anterior circulation: Both ICA siphons are patent. Cavernous and supraclinoid ICAs are heavily calcified with mild to moderate stenosis bilaterally. Patent carotid termini. Patent although diminutive MCA and ACA origins, 1st order segments (series 10, image 19). Diminutive or absent anterior communicating artery. ACA branches appear diminutive but patent. Left MCA M1 segment and bifurcation are patent without stenosis. Left MCA branches appear diminutive but patent. Contralateral right MCA M1 is patent, diminutive. Right MCA bifurcation is patent. But there are absent right MCA M2 branches, especially in the middle division is seen on series 12, image 22. Due to diminutive vessel caliber, discrete occlusion of these is difficult to identified. Venous sinuses: Faint contrast bolus, grossly patent. Anatomic variants: None significant. Review of the MIP images confirms the above findings IMPRESSION: 1. Generalized diminutive appearance of all circle-of-Willis branches, etiology unclear. But asymmetric absence of right MCA M2 branches compatible with ELVO in the setting of left side deficit. These results were communicated to Dr. Selina Cooley at 0910 hours on 09/22/2023 by text page via the  [employer] messaging system. 2. Metastatic cholangiocarcinoma. Large T4 thoracic lytic metastasis with epidural and extraosseous tumor extension asymmetric to the right. 3. Heavily calcified bilateral ICA siphons and vertebral artery V4 segments with generalized mild to moderate stenosis. 4. But mild for age extracranial atherosclerosis. No significant atherosclerotic stenosis in the neck. Electronically Signed   By: Odessa Fleming M.D.   On: 09/22/2023 09:18   CT HEAD CODE STROKE WO CONTRAST  Result Date: 09/22/2023 CLINICAL DATA:  Code stroke. 62 year old male left side deficit. Cholangiocarcinoma. EXAM: CT HEAD WITHOUT CONTRAST TECHNIQUE: Contiguous axial images were obtained from the  base of the skull through the vertex without intravenous contrast. RADIATION DOSE REDUCTION: This exam was performed according to the departmental dose-optimization program which includes automated exposure control, adjustment of the mA and/or kV according to patient size and/or use of iterative reconstruction technique. COMPARISON:  Face CT 05/14/2014.  PET-CT 12/10/2022. FINDINGS: Brain: Stable cerebral volume since February. No midline shift, ventriculomegaly, mass effect, evidence of mass lesion, or acute intracranial hemorrhage. Extensive dural calcifications. Confluent but indistinct hypodensity at the anterior right basal ganglia, right caudate nucleus and adjacent frontal horn white matter series 3, image 18. But this may have been present in February. Elsewhere gray-white differentiation within normal limits. No cortically based acute infarct identified. Vascular: Advanced calcified atherosclerosis at the skull base. No suspicious intracranial vascular hyperdensity. Skull: No acute osseous abnormality identified. Sinuses/Orbits: Improved right [facility] pattern of obstructive sinus disease since February. Residual mucoperiosteal thickening in the right maxillary sinus. Tympanic cavities and mastoids are clear. Other: No gaze deviation.  Calcified scalp vessel atherosclerosis. ASPECTS [facility] Stroke Program Early CT Score) Total score (0-10 with 10 being normal): 10 IMPRESSION: 1. Age indeterminate anterior right basal ganglia lacunar infarct, but might have been present on February PET-CT. 2. No acute cortically based infarct or acute intracranial hemorrhage identified. ASPECTS 10. 3. Advanced calcified atherosclerosis. Salient findings were communicated to Dr. Selina Cooley at 8:50 am on 09/22/2023 by text page via the [facility] messaging system. Electronically Signed   By: Odessa Fleming M.D.   On: 09/22/2023 08:50   CT CHEST ABDOMEN PELVIS W CONTRAST  Result Date: 09/16/2023 CLINICAL DATA:  Cholangiocarcinoma restaging *  Tracking Code: BO * EXAM: CT CHEST, ABDOMEN, AND PELVIS WITH CONTRAST TECHNIQUE: Multidetector CT imaging of the chest, abdomen and pelvis was performed following the standard protocol during bolus administration of intravenous contrast. RADIATION DOSE REDUCTION: This exam was performed according to the departmental dose-optimization program which includes automated exposure control, adjustment of the mA and/or kV according to patient size and/or use of iterative reconstruction technique. CONTRAST:  80mL ISOVUE-300 IOPAMIDOL (ISOVUE-300) INJECTION 61% COMPARISON:  07/16/2023 FINDINGS: CT CHEST FINDINGS Cardiovascular: Stable appearance the right Port-A-Cath with tip in the SVC. Prominent pulmonary artery favoring pulmonary arterial hypertension. Coronary and aortic atheromatous vascular calcifications. Mediastinum/Nodes: Scattered mediastinal lymph nodes are not overtly pathologically enlarged. Stable left thyroid nodule up to about 3.8 cm in long axis on image 9 series 2. In the setting of significant comorbidities or limited life expectancy, no follow-up recommended (ref: J Am Coll Radiol. 2015 Feb;12(2): 143-50). Lungs/Pleura: Mild scattered scarring in the lungs. Musculoskeletal: There is lytic lesions include the left scapula, the right ninth rib posteriorly, and especially the T4 vertebral body where there is a large lytic lesion encompassing most of the vertebral body especially eccentric to the right, with paraspinal component and increased epidural component which could be impinging on the thoracic cord. The T9 lytic lesion measures about 3.4  cm anterior-posterior, previously 2.8 cm. Left T12 posterior element involvement extending in the pedicle, adjacent vertebral body, lamina, and spinous process. There is also a separate metastatic lesion in the right T12 pedicle. CT ABDOMEN PELVIS FINDINGS Hepatobiliary: Nodular liver contour. The heterogeneous but primarily hypodense right hepatic lobe mass measures  7.4 by 5.8 cm on image 67 series 2, stable. Minor areas of satellite nodularity/hypodensity around this lesion are similar to prior. There is some mild hypodensity in segment 4a and a small hypodense lesion in segment 2 both which appear stable. Gallbladder unremarkable. Pancreas: Unremarkable Spleen: Measures 13.2 by 12.1 by 9.1 cm (volume = 760 cm^3), compatible with splenomegaly. Adrenals/Urinary Tract: Adrenal glands unremarkable. Stable benign Bosniak category 2 cyst of the right kidney upper pole with capsular calcifications, as worked up on prior exams. Right kidney lower pole benign cyst and small left renal cysts. No worrisome renal lesion. Urinary bladder partially obscured by streak artifact from the left hip implant. Stomach/Bowel: Unremarkable Vascular/Lymphatic: Atherosclerosis is present, including aortoiliac atherosclerotic disease. Right gastric node 1.1 cm in short axis on image 66 series 2, formerly 1.3 cm. Aortocaval node 1.0 cm in short axis on image 83 series 2, formerly 1.1 cm. Other similar borderline enlarged lymph nodes are present. Patent portal vein and patent splenic vein. Reproductive: Obscured by streak artifact from the left hip prosthesis. Grossly unremarkable. Other: Increased ascites, currently moderate. Increased mesenteric and omental edema without overt tumor nodularity. Musculoskeletal: Stable right L4 lytic lesion. Stable mixed density lesion of the right iliac bone. Stable lytic lesions involving the left acetabulum and left ischium. Right acetabular mass measures 5.2 cm in long axis, formerly 4.1 cm. Lytic metastatic lesion in the right pubic bone measures 2.6 cm in diameter, previously 1.9 cm in long axis. Mixed density lesion of the right ischial tuberosity appears stable. Chronic avascular necrosis of the right femoral head. Left total hip prosthesis. IMPRESSION: 1. Increased size of lytic metastatic lesions in the T9 vertebral body, right acetabulum, and right pubic  bone. The T9 lytic lesion has a large epidural component which could be impinging on the thoracic cord. Consider thoracic spine MRI without and with contrast to assess for cord impingement. 2. Stable size of the right hepatic lobe mass. 3. Increased ascites, currently moderate. Increased mesenteric and omental edema without overt tumor nodularity along peritoneal surfaces. 4. Stable borderline enlarged upper abdominal and retroperitoneal lymph nodes. 5. Chronic avascular necrosis of the right femoral head. 6. Prominent pulmonary artery favoring pulmonary arterial hypertension. 7. Aortic and coronary atherosclerosis. Aortic Atherosclerosis (ICD10-I70.0). Electronically Signed   By: Gaylyn Rong M.D.   On: 09/16/2023 17:09   MR Lumbar Spine W Wo Contrast  Result Date: 07/18/2023 CLINICAL DATA:  spine lesions, lower extremity weakness. History of gallbladder cancer with back pain and bilateral leg weakness. EXAM: MRI THORACIC AND LUMBAR SPINE WITHOUT AND WITH CONTRAST TECHNIQUE: Multiplanar and multiecho pulse sequences of the thoracic and lumbar spine were obtained without and with intravenous contrast. CONTRAST:  10mL GADAVIST GADOBUTROL 1 MMOL/ML IV SOLN COMPARISON:  CT chest/abdomen/pelvis 07/16/2023. FINDINGS: MRI THORACIC SPINE FINDINGS Alignment:  Normal. Vertebrae: Multiple enhancing lesions in the thoracic spine, including the right aspect of the T4 and T9 vertebral bodies, as well as the posterior elements of T12 and spinous process of T4. No epidural or paraspinal extension of tumor. Cord: Normal spinal cord signal and volume. No abnormal enhancement. Paraspinal and other soft tissues: No acute findings. See recent body CT report for description of findings in  the chest. Disc levels: No significant disc herniation or spinal canal stenosis. Right-greater-than-left facet arthropathy at T11-12 results in moderate right neural foraminal narrowing. MRI LUMBAR SPINE FINDINGS Segmentation:  Standard.  Alignment:  Normal. Vertebrae: Congenitally short pedicles throughout the lumbar spine. Heterogeneously enhancing lesion along the right aspect of the inferior endplate of L4, possibly an acute Schmorl's node versus site of metastasis. Enhancing lesion in the spinous process of L5. Enhancing lesion in the left posterior elements of T12 with mild extension into the left dorsal epidural space (axial image 2 series 4 and 7) and mild paraspinal extension. Enhancing lesions in the right sacral ala at the S1 level and right iliac crest. Conus medullaris: Extends to the L1-2 level and appears normal. No abnormal enhancement of the cauda equina nerve roots. Paraspinal and other soft tissues: No acute findings. See recent body CT report for description of intra-abdominal and pelvic findings. Disc levels: Disc bulge and facet arthropathy results in severe spinal canal stenosis at L4-5. Left eccentric disc bulge compresses the traversing left S1 nerve root in the subarticular zone at L5-S1. IMPRESSION: 1. Multiple enhancing lesions in the thoracic and lumbar spine, right sacral ala, and right iliac crest, consistent with metastatic disease. 2. Enhancing lesion in the left posterior elements of T12 with mild extension into the left dorsal epidural space and mild paraspinal extension. No significant spinal canal stenosis. 3. Severe spinal canal stenosis at L4-5. 4. Left eccentric disc bulge compresses the traversing left S1 nerve root in the subarticular zone at L5-S1. Electronically Signed   By: Orvan Falconer M.D.   On: 07/18/2023 15:11   MR Thoracic Spine W Wo Contrast  Result Date: 07/18/2023 CLINICAL DATA:  spine lesions, lower extremity weakness. History of gallbladder cancer with back pain and bilateral leg weakness. EXAM: MRI THORACIC AND LUMBAR SPINE WITHOUT AND WITH CONTRAST TECHNIQUE: Multiplanar and multiecho pulse sequences of the thoracic and lumbar spine were obtained without and with intravenous contrast.  CONTRAST:  10mL GADAVIST GADOBUTROL 1 MMOL/ML IV SOLN COMPARISON:  CT chest/abdomen/pelvis 07/16/2023. FINDINGS: MRI THORACIC SPINE FINDINGS Alignment:  Normal. Vertebrae: Multiple enhancing lesions in the thoracic spine, including the right aspect of the T4 and T9 vertebral bodies, as well as the posterior elements of T12 and spinous process of T4. No epidural or paraspinal extension of tumor. Cord: Normal spinal cord signal and volume. No abnormal enhancement. Paraspinal and other soft tissues: No acute findings. See recent body CT report for description of findings in the chest. Disc levels: No significant disc herniation or spinal canal stenosis. Right-greater-than-left facet arthropathy at T11-12 results in moderate right neural foraminal narrowing. MRI LUMBAR SPINE FINDINGS Segmentation:  Standard. Alignment:  Normal. Vertebrae: Congenitally short pedicles throughout the lumbar spine. Heterogeneously enhancing lesion along the right aspect of the inferior endplate of L4, possibly an acute Schmorl's node versus site of metastasis. Enhancing lesion in the spinous process of L5. Enhancing lesion in the left posterior elements of T12 with mild extension into the left dorsal epidural space (axial image 2 series 4 and 7) and mild paraspinal extension. Enhancing lesions in the right sacral ala at the S1 level and right iliac crest. Conus medullaris: Extends to the L1-2 level and appears normal. No abnormal enhancement of the cauda equina nerve roots. Paraspinal and other soft tissues: No acute findings. See recent body CT report for description of intra-abdominal and pelvic findings. Disc levels: Disc bulge and facet arthropathy results in severe spinal canal stenosis at L4-5. Left eccentric disc  bulge compresses the traversing left S1 nerve root in the subarticular zone at L5-S1. IMPRESSION: 1. Multiple enhancing lesions in the thoracic and lumbar spine, right sacral ala, and right iliac crest, consistent with  metastatic disease. 2. Enhancing lesion in the left posterior elements of T12 with mild extension into the left dorsal epidural space and mild paraspinal extension. No significant spinal canal stenosis. 3. Severe spinal canal stenosis at L4-5. 4. Left eccentric disc bulge compresses the traversing left S1 nerve root in the subarticular zone at L5-S1. Electronically Signed   By: Orvan Falconer M.D.   On: 07/18/2023 15:11   CT CHEST ABDOMEN PELVIS W CONTRAST  Result Date: 07/17/2023 CLINICAL DATA:  Cholangiocarcinoma restaging. Prior chemotherapy and radiation therapy. * Tracking Code: BO * EXAM: CT CHEST, ABDOMEN, AND PELVIS WITH CONTRAST TECHNIQUE: Multidetector CT imaging of the chest, abdomen and pelvis was performed following the standard protocol during bolus administration of intravenous contrast. RADIATION DOSE REDUCTION: This exam was performed according to the departmental dose-optimization program which includes automated exposure control, adjustment of the mA and/or kV according to patient size and/or use of iterative reconstruction technique. CONTRAST:  ISOVUE-300 IOPAMIDOL (ISOVUE-300) INJECTION 61% COMPARISON:  04/25/2023 FINDINGS: CT CHEST FINDINGS Cardiovascular: Right Port-A-Cath tip: Lower SVC. Atheromatous vascular calcification of the thoracic aorta and coronary arteries. Borderline cardiomegaly. Mediastinum/Nodes: Stable left thyroid nodule 3.5 cm. In the setting of significant comorbidities or limited life expectancy, no follow-up recommended (ref: J Am Coll Radiol. 2015 Feb;12(2): 143-50).Right paratracheal node 0.8 cm in short axis, formerly 1.1 cm, image 18 series 3. Another right paratracheal lymph node measures 0.8 cm on image 23 series 3, formerly 1.0 cm. Additional upper normal sized subcarinal infrahilar lymph nodes. No current overt pathologic adenopathy in the chest. Lungs/Pleura: Mild biapical pleuroparenchymal scarring. Mild scarring in the posterior basal segments of both  lower lobes. Stable lingular scarring. The patchy opacities in the lingula and left lower lobe shown on the prior exam have resolved. Musculoskeletal: Enlarging lytic lesion of the T4 vertebral body eccentric to the right, 3.0 cm in long axis on image 107 series 8, formerly 1.9 cm in long axis. Enlarging lytic lesion in the T9 vertebral body. Essentially stable sclerotic lesion in the sternal body. New lytic lesion, cannot exclude pathologic fracture, posterolaterally in the ninth rib, image 130 series 5. Healing fracture of the right eighth rib laterally, nonspecific for underlying pathologic fracture, this could simply be a benign subacute rib fracture. Enlarging mixed lytic and sclerotic lesion of the left scapula, image 19 series 5. CT ABDOMEN PELVIS FINDINGS Hepatobiliary: Dominant right hepatic lobe mass measures 7.4 by 5.8 cm on image 65 series 3, formerly 7.1 by 6.8 cm, roughly stable. Other stable tiny hypodense lesions in the right hepatic lobe and in segment 2 of the left hepatic lobe, nonspecific. Prominent caudate lobe, suspicion for early cirrhosis. Gallbladder unremarkable. No biliary dilatation. Patent portal vein. Pancreas: Unremarkable Spleen: Spleen measures 13.1 by 8.0 by 12.8 cm (volume = 700 cm^3), compatible with splenomegaly. No focal splenic lesion identified. Adrenals/Urinary Tract: Stable benign renal cystic lesions including the lesion with calcified septations in the right upper lobe compatible with benign Bosniak category 2 cyst based on previous and current imaging workup assessments. No further imaging workup of these lesions is indicated. Adrenal glands normal. Left distal ureter an urinary bladder partially obscured by streak artifact from the patient's left hip implant. No hydronephrosis or hydroureter. Stomach/Bowel: Focal high density in the small bowel at 1.2 cm on image  116 series 3, probably a high density pill fragment, less likely to be an intra lith. Otherwise unremarkable.  Vascular/Lymphatic: Atherosclerosis is present, including aortoiliac atherosclerotic disease. Right gastric lymph nodes observed, including a 2.7 by 1.3 cm node on image 66 of series 3, previously 3.7 by 1.7 cm by my measurements, accordingly moderately reduced in size from previous. Aortocaval node 1.1 cm in short axis on image 81 series 3, formerly same. Other scattered smaller retroperitoneal and mesenteric lymph nodes are present. Reproductive: Mild prostatomegaly. Prostate gland partially obscured by streak artifact from the patient's left hip implant. Other: Mild ascites and scattered mesenteric edema. Confluent left upper quadrant omental edema without overt omental nodularity. Musculoskeletal: Enlarging lytic metastatic lesions including the left T12 pedicle, the right L4 inferior endplate, the L5 spinous process, the right hip quadrilateral plate, the bilateral ischium and the left posterior acetabulum. Mild increase in size of the sclerotic lesion in the right iliac bone on image 105 series 3. Cannot exclude epidural extension of tumor adjacent to the T12 posterior element involvement. IMPRESSION: 1. Mixed response to therapy. The dominant right hepatic lobe mass is roughly stable in size. The right gastric lymph node has moderately reduced in size. The right paratracheal lymph nodes have decreased in size. However, there are multiple enlarging lytic lesions in the axial and appendicular skeleton, including the T4 vertebral body, the T9 vertebral body, the left scapula, the right hip quadrilateral plate, the bilateral ischium, and the left posterior acetabulum. Cannot exclude epidural extension of tumor adjacent to the T12 posterior element involvement. 2. Healing fracture of the right eighth rib laterally, nonspecific for underlying pathologic fracture, this could simply be a benign subacute rib fracture. 3. Mild ascites and scattered mesenteric edema. Confluent left upper quadrant omental edema without  overt omental nodularity. 4. Splenomegaly. 5. Mild prostatomegaly. 6. Aortic atherosclerosis. 7. The patchy opacities in the lingula and left lower lobe shown on the prior exam have resolved. Aortic Atherosclerosis (ICD10-I70.0). Electronically Signed   By: Gaylyn Rong M.D.   On: 07/17/2023 11:31   DG Chest 2 View  Result Date: 07/06/2023 CLINICAL DATA:  cough, green sputum EXAM: CHEST - 2 VIEW COMPARISON:  February 11, 2023, April 25, 2023 FINDINGS: The cardiomediastinal silhouette is unchanged in contour.RIGHT chest port with tip terminating over the superior cavoatrial junction. No pleural effusion. No pneumothorax. Similar LEFT basilar coarse reticular prominence, better evaluated on prior CT. No acute pleuroparenchymal abnormality. Visualized abdomen is unremarkable. Multilevel degenerative changes of the thoracic spine. IMPRESSION: Similar LEFT basilar coarse reticular prominence, better evaluated on prior CT. No new focal airspace opacity. Electronically Signed   By: Meda Klinefelter M.D.   On: 07/06/2023 16:49     Assessment and plan-   # Acute stroke, neurology recommendation was reviewed. Permissive hypertension  Stroke workup.  # Antiphospholipid syndrome with history of recurrent thrombosis. Most recently he was switched to Eliquis 5 mg twice daily. With his new stroke, I agree with switching to heparin gtt.  At discharge recommend to switch to Lovenox 1 mg/kg twice daily  # Stage IV cholangiocarcinoma Patient has progressed on multiple lines of chemotherapy treatments.  Prognosis is extremely poor.  He also did not tolerate third line treatments very well and has been on chemo break. I discussed with patient about her recent CT scan.  He knows about the findings of decreased progression. From oncology aspect, he is deteriorating and additional chemotherapy will not be beneficial. I recommend patient to consider comfort care/hospice. He is  undecided but he is leaning towards  comfort care.  Wife and son understand his poor prognosis and are open to comfort care/hospice. Palliative care service was consulted and will be following  Discussed with Dr. Windy Fast.  # T9 bone lesion with epidural component.  Empirically on dexamethasone 8 mg daily Continue PPI for GI prophylaxis.  Thank you for allowing me to participate in the care of this patient.   [name], MD, PhD Hematology Oncology 09/23/2023

## 2023-09-23 NOTE — Progress Notes (Signed)
Inpatient Rehab Coordinator Note:  I spoke with pt's spouse over the phone to discuss CIR recommendations and goals/expectations of CIR stay.  We reviewed 3 hrs/day of therapy, physician follow up, and average length of stay 2 weeks (dependent upon progress) with goals of supervision to min assist.  I reviewed likely need for 24/7 supervision when pt first gets home and she is in agreement.  We reviewed need for prior auth and I will work on that today.  We can plan for potential admit Tuesday vs Wednesday pending insurance approval and bed availability.    Estill Dooms, PT, DPT Admissions Coordinator [phone] 09/23/23  10:24 AM

## 2023-09-23 NOTE — Progress Notes (Signed)
Subjective: Lying in bed in NAD.   Objective: Current vital signs: BP (!) 149/73 (BP Location: Left Arm)   Pulse 100   Temp 98 F (36.7 C)   Resp 18   Ht 6\' 2"  (1.88 m)   Wt 107 kg   SpO2 99%   BMI 30.30 kg/m  Vital signs in last 24 hours: Temp:  [97.5 F (36.4 C)-98.3 F (36.8 C)] 98 F (36.7 C) (11/25 1155) Pulse Rate:  [93-100] 100 (11/25 1155) Resp:  [14-18] 18 (11/25 1155) BP: (149-158)/(70-84) 149/73 (11/25 1155) SpO2:  [98 %-99 %] 99 % (11/25 1155)  Intake/Output from previous day: 11/24 0701 - 11/25 0700 In: 1041.9 [I.V.:1041.9] Out: 1580 [Urine:1580] Intake/Output this shift: No intake/output data recorded. Nutritional status:  Diet Order             DIET - DYS 1 Room service appropriate? Yes with Assist; Fluid consistency: Nectar Thick  Diet effective now                  HEENT: [place]/AT Lungs: Respirations unlabored Ext: Mild pitting edema  Neurologic Exam: Ment: Awake with decreased level of alertness and increased latencies of verbal and motor responses. Oriented to the year, month, city and state, but not the day. Naming for common objects is intact.  CN: Able to identify objects by sight. Temporal visual fields intact. Severely hypometric saccades. Left facial droop.  Motor:  RUE 5/5 LUE 0/5 RLE 2/5 LLE 1/5 Sensory: Insensate to LUE and LLE Reflexes: 1+ biceps reflexes bilaterally. Trace patellar reflexes Cerebellar: Noncooperative Gait: Unable to assess  Lab Results: Results for orders placed or performed during the hospital encounter of 09/22/23 (from the past 48 hour(s))  Hemoglobin A1c     Status: None   Collection Time: 09/22/23  8:35 AM  Result Value Ref Range   Hgb A1c MFr Bld 5.4 4.8 - 5.6 %    Comment: (NOTE) Pre diabetes:          5.7%-6.4%  Diabetes:              >6.4%  Glycemic control for   <7.0% adults with diabetes    Mean Plasma Glucose 108.28 mg/dL    Comment: Performed at [facility], [address],  [place], [place] [address]  CBG monitoring, ED     Status: Abnormal   Collection Time: 09/22/23  8:37 AM  Result Value Ref Range   Glucose-Capillary 144 (H) 70 - 99 mg/dL    Comment: Glucose reference range applies only to samples taken after fasting for at least 8 hours.  Protime-INR     Status: Abnormal   Collection Time: 09/22/23  8:50 AM  Result Value Ref Range   Prothrombin Time 19.2 (H) 11.4 - 15.2 seconds   INR 1.6 (H) 0.8 - 1.2    Comment: (NOTE) INR goal varies based on device and disease states. Performed at [facility], [address] [place], [place] [address]   APTT     Status: None   Collection Time: 09/22/23  8:50 AM  Result Value Ref Range   aPTT 34 24 - 36 seconds    Comment: Performed at [facility], [address] [place], [place] [address]  CBC     Status: Abnormal   Collection Time: 09/22/23  8:50 AM  Result Value Ref Range   WBC 9.3 4.0 - 10.5 K/uL   RBC 3.15 (L) 4.22 - 5.81 MIL/uL   Hemoglobin 9.7 (L)  13.0 - 17.0 g/dL   HCT 52.8 (L) 41.3 - 24.4 %   MCV 94.3 80.0 - 100.0 fL   MCH 30.8 26.0 - 34.0 pg   MCHC 32.7 30.0 - 36.0 g/dL   RDW 01.0 27.2 - 53.6 %   Platelets 149 (L) 150 - 400 K/uL   nRBC 0.0 0.0 - 0.2 %    Comment: Performed at [facility], [address] [place], [place] [address]  Differential     Status: Abnormal   Collection Time: 09/22/23  8:50 AM  Result Value Ref Range   Neutrophils Relative % 91 %   Neutro Abs 8.4 (H) 1.7 - 7.7 K/uL   Lymphocytes Relative 2 %   Lymphs Abs 0.2 (L) 0.7 - 4.0 K/uL   Monocytes Relative 6 %   Monocytes Absolute 0.6 0.1 - 1.0 K/uL   Eosinophils Relative 0 %   Eosinophils Absolute 0.0 0.0 - 0.5 K/uL   Basophils Relative 0 %   Basophils Absolute 0.0 0.0 - 0.1 K/uL   Immature Granulocytes 1 %   Abs Immature Granulocytes 0.05 0.00 - 0.07 K/uL    Comment: Performed at [facility], [address] [place], [place] [address]  Comprehensive metabolic panel     Status:  Abnormal   Collection Time: 09/22/23  8:50 AM  Result Value Ref Range   Sodium 135 135 - 145 mmol/L   Potassium 3.7 3.5 - 5.1 mmol/L   Chloride 102 98 - 111 mmol/L   CO2 21 (L) 22 - 32 mmol/L   Glucose, Bld 158 (H) 70 - 99 mg/dL    Comment: Glucose reference range applies only to samples taken after fasting for at least 8 hours.   BUN 12 8 - 23 mg/dL   Creatinine, Ser 9.56 0.61 - 1.24 mg/dL   Calcium 9.4 8.9 - 38.7 mg/dL   Total Protein 6.7 6.5 - 8.1 g/dL   Albumin 2.7 (L) 3.5 - 5.0 g/dL   AST 24 15 - 41 U/L   ALT 13 0 - 44 U/L   Alkaline Phosphatase 118 38 - 126 U/L   Total Bilirubin 0.8 <1.2 mg/dL   GFR, Estimated >56 >43 mL/min    Comment: (NOTE) Calculated using the CKD-EPI Creatinine Equation (2021)    Anion gap 12 5 - 15    Comment: Performed at [facility], [address] [place], [place] [address]  Ethanol     Status: None   Collection Time: 09/22/23  8:50 AM  Result Value Ref Range   Alcohol, Ethyl (B) <10 <10 mg/dL    Comment: (NOTE) Lowest detectable limit for serum alcohol is 10 mg/dL.  For medical purposes only. Performed at [facility], [address] [place], [place] [address]   CBG monitoring, ED     Status: Abnormal   Collection Time: 09/22/23 12:05 PM  Result Value Ref Range   Glucose-Capillary 120 (H) 70 - 99 mg/dL    Comment: Glucose reference range applies only to samples taken after fasting for at least 8 hours.  HIV Antibody (routine testing w rflx)     Status: None   Collection Time: 09/22/23 12:06 PM  Result Value Ref Range   HIV Screen 4th Generation wRfx Non Reactive Non Reactive    Comment: Performed at [facility], [address] [place], [place] [address]  Glucose, capillary     Status: Abnormal   Collection Time: 09/22/23  5:22 PM  Result Value Ref Range  Glucose-Capillary 123 (H) 70 - 99 mg/dL    Comment: Glucose reference range applies only to samples taken after fasting for at least 8 hours.  Glucose,  capillary     Status: Abnormal   Collection Time: 09/22/23 11:05 PM  Result Value Ref Range   Glucose-Capillary 142 (H) 70 - 99 mg/dL    Comment: Glucose reference range applies only to samples taken after fasting for at least 8 hours.  Heparin level (unfractionated)     Status: Abnormal   Collection Time: 09/23/23  5:42 AM  Result Value Ref Range   Heparin Unfractionated >1.10 (H) 0.30 - 0.70 IU/mL    Comment: (NOTE) The clinical reportable range upper limit is being lowered to >1.10 to align with the FDA approved guidance for the current laboratory assay.  If heparin results are below expected values, and patient dosage has  been confirmed, suggest follow up testing of antithrombin III levels. Performed at [facility], [address] [place], [place] [address]   Lipid panel     Status: Abnormal   Collection Time: 09/23/23  5:42 AM  Result Value Ref Range   Cholesterol 112 0 - 200 mg/dL   Triglycerides 77 <644 mg/dL   HDL 32 (L) >03 mg/dL   Total CHOL/HDL Ratio 3.5 RATIO   VLDL 15 0 - 40 mg/dL   LDL Cholesterol 65 0 - 99 mg/dL    Comment:        Total Cholesterol/HDL:CHD Risk Coronary Heart Disease Risk Table                     Men   Women  1/2 Average Risk   3.4   3.3  Average Risk       5.0   4.4  2 X Average Risk   9.6   7.1  3 X Average Risk  23.4   11.0        Use the calculated Patient Ratio above and the CHD Risk Table to determine the patient's CHD Risk.        ATP III CLASSIFICATION (LDL):  <100     mg/dL   Optimal  474-259  mg/dL   Near or Above                    Optimal  130-159  mg/dL   Borderline  563-875  mg/dL   High  >643     mg/dL   Very High Performed at [facility], [address] [place], [place] [address]   CBC     Status: Abnormal   Collection Time: 09/23/23  5:42 AM  Result Value Ref Range   WBC 6.1 4.0 - 10.5 K/uL   RBC 2.60 (L) 4.22 - 5.81 MIL/uL   Hemoglobin 8.0 (L) 13.0 - 17.0 g/dL   HCT 88.4 (L) 16.6 - 06.3  %   MCV 92.3 80.0 - 100.0 fL   MCH 30.8 26.0 - 34.0 pg   MCHC 33.3 30.0 - 36.0 g/dL   RDW 01.6 01.0 - 93.2 %   Platelets 114 (L) 150 - 400 K/uL   nRBC 0.0 0.0 - 0.2 %    Comment: Performed at [facility], [address] [place], [place] [address]  Glucose, capillary     Status: Abnormal   Collection Time: 09/23/23  7:52 AM  Result Value Ref Range   Glucose-Capillary 126 (H) 70 - 99 mg/dL    Comment: Glucose reference range applies  only to samples taken after fasting for at least 8 hours.  Glucose, capillary     Status: Abnormal   Collection Time: 09/23/23 11:57 AM  Result Value Ref Range   Glucose-Capillary 143 (H) 70 - 99 mg/dL    Comment: Glucose reference range applies only to samples taken after fasting for at least 8 hours.  Heparin level (unfractionated)     Status: Abnormal   Collection Time: 09/23/23  3:19 PM  Result Value Ref Range   Heparin Unfractionated 0.94 (H) 0.30 - 0.70 IU/mL    Comment: (NOTE) The clinical reportable range upper limit is being lowered to >1.10 to align with the FDA approved guidance for the current laboratory assay.  If heparin results are below expected values, and patient dosage has  been confirmed, suggest follow up testing of antithrombin III levels. Performed at [facility], [address] [place], [place] [address]   APTT     Status: Abnormal   Collection Time: 09/23/23  3:19 PM  Result Value Ref Range   aPTT 45 (H) 24 - 36 seconds    Comment:        IF BASELINE aPTT IS ELEVATED, SUGGEST PATIENT RISK ASSESSMENT BE USED TO DETERMINE APPROPRIATE ANTICOAGULANT THERAPY. Performed at [facility], [address] [place], [place] [address]     No results found for this or any previous visit (from the past 240 hour(s)).  Lipid Panel Recent Labs    09/23/23 0542  CHOL 112  TRIG 77  HDL 32*  CHOLHDL 3.5  VLDL 15  LDLCALC 65    Studies/Results: ECHOCARDIOGRAM COMPLETE  Result Date:  09/23/2023    ECHOCARDIOGRAM REPORT   Patient Name:   Christopher Marsh Date of Exam: 09/23/2023 Medical Rec #:  [id number]        Height:       74.0 in Accession #:    [id number]       Weight:       236.0 lb Date of Birth:  [birth year]         BSA:          2.332 m Patient Age:    62 years         BP:           151/80 mmHg Patient Gender: M                HR:           104 bpm. Exam Location:  [facility] Procedure: 2D Echo, Cardiac Doppler and Color Doppler Indications:     Stroke  History:         Patient has prior history of Echocardiogram examinations, most                  recent 02/13/2023. CHF, Stroke, Arrythmias:Atrial Fibrillation,                  Atrial Flutter and Tachycardia, Signs/Symptoms:Shortness of                  Breath and Edema; Risk Factors:Hypertension, Diabetes and                  Dyslipidemia.  Sonographer:     Mikki Harbor Referring Phys:  [id number] Emeline General Diagnosing Phys: Lorine Bears MD IMPRESSIONS  1. Left ventricular ejection fraction, by estimation, is 55 to 60%. The left ventricle has normal function. The left ventricle has no regional wall motion abnormalities. Left  ventricular diastolic parameters are consistent with Grade I diastolic dysfunction (impaired relaxation).  2. Right ventricular systolic function is normal. The right ventricular size is normal. Tricuspid regurgitation signal is inadequate for assessing PA pressure.  3. Left atrial size was moderately dilated.  4. Right atrial size was mildly dilated.  5. The mitral valve is normal in structure. No evidence of mitral valve regurgitation. No evidence of mitral stenosis.  6. The aortic valve is normal in structure. Aortic valve regurgitation is not visualized. Aortic valve sclerosis/calcification is present, without any evidence of aortic stenosis.  7. The inferior vena cava is normal in size with greater than 50% respiratory variability, suggesting right atrial pressure of 3 mmHg. FINDINGS  Left Ventricle: Left  ventricular ejection fraction, by estimation, is 55 to 60%. The left ventricle has normal function. The left ventricle has no regional wall motion abnormalities. The left ventricular internal cavity size was normal in size. There is  no left ventricular hypertrophy. Left ventricular diastolic parameters are consistent with Grade I diastolic dysfunction (impaired relaxation). Right Ventricle: The right ventricular size is normal. No increase in right ventricular wall thickness. Right ventricular systolic function is normal. Tricuspid regurgitation signal is inadequate for assessing PA pressure. Left Atrium: Left atrial size was moderately dilated. Right Atrium: Right atrial size was mildly dilated. Pericardium: There is no evidence of pericardial effusion. Mitral Valve: The mitral valve is normal in structure. No evidence of mitral valve regurgitation. No evidence of mitral valve stenosis. MV peak gradient, 3.4 mmHg. The mean mitral valve gradient is 1.0 mmHg. Tricuspid Valve: The tricuspid valve is normal in structure. Tricuspid valve regurgitation is not demonstrated. No evidence of tricuspid stenosis. Aortic Valve: The aortic valve is normal in structure. Aortic valve regurgitation is not visualized. Aortic valve sclerosis/calcification is present, without any evidence of aortic stenosis. Aortic valve mean gradient measures 6.0 mmHg. Aortic valve peak  gradient measures 14.9 mmHg. Aortic valve area, by VTI measures 2.76 cm. Pulmonic Valve: The pulmonic valve was normal in structure. Pulmonic valve regurgitation is not visualized. No evidence of pulmonic stenosis. Aorta: The aortic root is normal in size and structure. Venous: The inferior vena cava is normal in size with greater than 50% respiratory variability, suggesting right atrial pressure of 3 mmHg. IAS/Shunts: No atrial level shunt detected by color flow Doppler.  LEFT VENTRICLE PLAX 2D LVIDd:         5.00 cm   Diastology LVIDs:         3.50 cm   LV e'  medial:    9.68 cm/s LV PW:         1.10 cm   LV E/e' medial:  7.1 LV IVS:        1.00 cm   LV e' lateral:   14.90 cm/s LVOT diam:     2.10 cm   LV E/e' lateral: 4.6 LV SV:         93 LV SV Index:   40 LVOT Area:     3.46 cm  RIGHT VENTRICLE RV Basal diam:  4.80 cm RV Mid diam:    3.90 cm RV S prime:     23.70 cm/s TAPSE (M-mode): 3.2 cm LEFT ATRIUM           Index        RIGHT ATRIUM           Index LA diam:      4.00 cm 1.72 cm/m   RA Area:  24.20 cm LA Vol (A2C): 76.9 ml 32.98 ml/m  RA Volume:   81.30 ml  34.86 ml/m LA Vol (A4C): 81.3 ml 34.86 ml/m  AORTIC VALVE                     PULMONIC VALVE AV Area (Vmax):    2.46 cm      PV Vmax:       1.38 m/s AV Area (Vmean):   3.01 cm      PV Peak grad:  7.6 mmHg AV Area (VTI):     2.76 cm AV Vmax:           193.00 cm/s AV Vmean:          110.000 cm/s AV VTI:            0.336 m AV Peak Grad:      14.9 mmHg AV Mean Grad:      6.0 mmHg LVOT Vmax:         137.00 cm/s LVOT Vmean:        95.700 cm/s LVOT VTI:          0.268 m LVOT/AV VTI ratio: 0.80  AORTA Ao Root diam: 3.90 cm MITRAL VALVE MV Area (PHT): 2.47 cm    SHUNTS MV Area VTI:   3.23 cm    Systemic VTI:  0.27 m MV Peak grad:  3.4 mmHg    Systemic Diam: 2.10 cm MV Mean grad:  1.0 mmHg MV Vmax:       0.93 m/s MV Vmean:      52.7 cm/s MV Decel Time: 307 msec MV E velocity: 69.20 cm/s MV A velocity: 86.50 cm/s MV E/A ratio:  0.80 Lorine Bears MD Electronically signed by Lorine Bears MD Signature Date/Time: 09/23/2023/5:11:28 PM    Final    MR BRAIN WO CONTRAST  Result Date: 09/22/2023 CLINICAL DATA:  62 year old male with metastatic cholangiocarcinoma, code stroke presentation with left side deficit and abnormal appearance of right MCA branches on CTA. EXAM: MRI HEAD WITHOUT CONTRAST TECHNIQUE: Multiplanar, multiecho pulse sequences of the brain and surrounding structures were obtained without intravenous contrast. COMPARISON:  CT head, CTA head and neck earlier today. FINDINGS: Brain: On DWI  there is faint abnormal diffusion signal in the right corona radiata, patchy involvement of the right postcentral gyrus (series 5, images 77-83). Faint involvement of the posterior limb external capsule also. But also punctate areas of contralateral anterior left frontal lobe subcortical white matter restricted diffusion also (series 5, image 80). Elsewhere diffusion appears to remain normal. Minimal T2 and FLAIR hyperintensity in the affected areas. No evidence of hemorrhage or mass effect. Superimposed chronic lacunar infarct of the right caudate corresponding to the plain CT finding today (series 6, image 24). No superimposed midline shift, mass effect, evidence of mass lesion, ventriculomegaly, extra-axial collection or acute intracranial hemorrhage. Cervicomedullary junction and pituitary are within normal limits. Background gray and white matter signal is largely normal for age, however, there is subtle evidence of cortical encephalomalacia in the left occipital lobe series 9, image 24. Chronic microhemorrhage also noted in the pontomedullary junction series 10, image 14. Vascular: Major intracranial vascular flow voids are preserved. Skull and upper cervical spine: Visible bone marrow signal and cervical spine appear negative. Sinuses/Orbits: Negative orbits.  Right maxillary sinus disease. Other: Mastoids are clear. Grossly normal visible internal auditory structures. Negative visible scalp and face. IMPRESSION: 1. Positive for scattered small acute infarcts in the Right MCA territory, from the posterior  right external capsule, through the corona radiata and to the postcentral gyrus. Additional superimposed small contralateral left anterior frontal lobe white matter lacunar infarct. No associated hemorrhage or mass effect. 2. Subtle chronic left PCA territory infarct at the occipital pole. Otherwise mild for age underlying chronic small vessel disease including at the right caudate, brainstem. Electronically  Signed   By: Odessa Fleming M.D.   On: 09/22/2023 13:09   CT ANGIO HEAD NECK W WO CM (CODE STROKE)  Result Date: 09/22/2023 CLINICAL DATA:  Code stroke. 61 year old male left side deficit. Metastatic cholangiocarcinoma. EXAM: CT ANGIOGRAPHY HEAD AND NECK TECHNIQUE: Multidetector CT imaging of the head and neck was performed using the standard protocol during bolus administration of intravenous contrast. Multiplanar CT image reconstructions and MIPs were obtained to evaluate the vascular anatomy. Carotid stenosis measurements (when applicable) are obtained utilizing NASCET criteria, using the distal internal carotid diameter as the denominator. RADIATION DOSE REDUCTION: This exam was performed according to the departmental dose-optimization program which includes automated exposure control, adjustment of the mA and/or kV according to patient size and/or use of iterative reconstruction technique. CONTRAST:  75mL OMNIPAQUE IOHEXOL 350 MG/ML SOLN COMPARISON:  Head CT today.  Thoracic MRI 07/18/2023. FINDINGS: CTA NECK Skeleton: Destructive thoracic vertebral metastasis at T4 redemonstrated. Epidural and extraosseous tumor there asymmetric to the right (series 5, image 190). T4 vertebral height appears stable since the September MRI. Upper chest: Right chest Port-A-Cath. Mediastinal lipomatosis. No superior mediastinal lymphadenopathy. No upper lung metastatic disease. Other neck: Heterogeneously enlarged left thyroid This has been evaluated on previous imaging. (ref: J Am Coll Radiol. 2015 Feb;12(2): 143-50).No other neck mass or lymphadenopathy. Aortic arch: 3 vessel arch.  Calcified aortic atherosclerosis. Right carotid system: Brachiocephalic artery and right CCA origin without significant plaque or stenosis. Mild calcified plaque at the right ICA origin and bulb. Right ICA is patent to the skull base without stenosis. Left carotid system: Similar mild left ICA origin atherosclerosis without stenosis. Vertebral  arteries: Minimal proximal right subclavian plaque without stenosis. Normal right vertebral artery origin. Right vertebral artery is patent in the neck and to the skull base with no significant stenosis. Minimal proximal left subclavian artery plaque. Calcified atherosclerosis at the left vertebral artery origin with only mild stenosis on series 7, image 321. Codominant left vertebral artery is patent to the skull base with no additional plaque or stenosis. CTA HEAD Posterior circulation: Heavily calcified bilateral vertebral artery V4 segments on series 7, image 155 with mild to moderate bilateral stenosis. Distal vertebrals and vertebrobasilar junction remain patent. Patent basilar artery without stenosis. Patent although diminutive SCA and PCA origins. Posterior communicating arteries are diminutive or absent. Bilateral PCA branches appear diminutive (series 8, image 21) but seem to remain patent. Anterior circulation: Both ICA siphons are patent. Cavernous and supraclinoid ICAs are heavily calcified with mild to moderate stenosis bilaterally. Patent carotid termini. Patent although diminutive MCA and ACA origins, 1st order segments (series 10, image 19). Diminutive or absent anterior communicating artery. ACA branches appear diminutive but patent. Left MCA M1 segment and bifurcation are patent without stenosis. Left MCA branches appear diminutive but patent. Contralateral right MCA M1 is patent, diminutive. Right MCA bifurcation is patent. But there are absent right MCA M2 branches, especially in the middle division is seen on series 12, image 22. Due to diminutive vessel caliber, discrete occlusion of these is difficult to identified. Venous sinuses: Faint contrast bolus, grossly patent. Anatomic variants: None significant. Review of the MIP images confirms the above  findings IMPRESSION: 1. Generalized diminutive appearance of all circle-of-Willis branches, etiology unclear. But asymmetric absence of right MCA  M2 branches compatible with ELVO in the setting of left side deficit. These results were communicated to Dr. Selina Cooley at 0910 hours on 09/22/2023 by text page via the [facility] messaging system. 2. Metastatic cholangiocarcinoma. Large T4 thoracic lytic metastasis with epidural and extraosseous tumor extension asymmetric to the right. 3. Heavily calcified bilateral ICA siphons and vertebral artery V4 segments with generalized mild to moderate stenosis. 4. But mild for age extracranial atherosclerosis. No significant atherosclerotic stenosis in the neck. Electronically Signed   By: Odessa Fleming M.D.   On: 09/22/2023 09:18   CT HEAD CODE STROKE WO CONTRAST  Result Date: 09/22/2023 CLINICAL DATA:  Code stroke. 62 year old male left side deficit. Cholangiocarcinoma. EXAM: CT HEAD WITHOUT CONTRAST TECHNIQUE: Contiguous axial images were obtained from the base of the skull through the vertex without intravenous contrast. RADIATION DOSE REDUCTION: This exam was performed according to the departmental dose-optimization program which includes automated exposure control, adjustment of the mA and/or kV according to patient size and/or use of iterative reconstruction technique. COMPARISON:  Face CT 05/14/2014.  PET-CT 12/10/2022. FINDINGS: Brain: Stable cerebral volume since February. No midline shift, ventriculomegaly, mass effect, evidence of mass lesion, or acute intracranial hemorrhage. Extensive dural calcifications. Confluent but indistinct hypodensity at the anterior right basal ganglia, right caudate nucleus and adjacent frontal horn white matter series 3, image 18. But this may have been present in February. Elsewhere gray-white differentiation within normal limits. No cortically based acute infarct identified. Vascular: Advanced calcified atherosclerosis at the skull base. No suspicious intracranial vascular hyperdensity. Skull: No acute osseous abnormality identified. Sinuses/Orbits: Improved right [facility] pattern of obstructive  sinus disease since February. Residual mucoperiosteal thickening in the right maxillary sinus. Tympanic cavities and mastoids are clear. Other: No gaze deviation.  Calcified scalp vessel atherosclerosis. ASPECTS [facility] Stroke Program Early CT Score) Total score (0-10 with 10 being normal): 10 IMPRESSION: 1. Age indeterminate anterior right basal ganglia lacunar infarct, but might have been present on February PET-CT. 2. No acute cortically based infarct or acute intracranial hemorrhage identified. ASPECTS 10. 3. Advanced calcified atherosclerosis. Salient findings were communicated to Dr. Selina Cooley at 8:50 am on 09/22/2023 by text page via the [facility] messaging system. Electronically Signed   By: Odessa Fleming M.D.   On: 09/22/2023 08:50    Medications: Scheduled:  dexamethasone (DECADRON) injection  8 mg Intravenous Daily   feeding supplement  237 mL Oral BID BM   feeding supplement  237 mL Oral BID BM   insulin aspart  0-15 Units Subcutaneous TID WC   insulin aspart  0-5 Units Subcutaneous QHS   pravastatin  20 mg Oral Q2000   sodium chloride flush  10 mL Intravenous Q12H   triamcinolone cream  1 Application Topical TID   Continuous:  heparin 1,100 Units/hr (09/23/23 1613)   TTE: 1. Left ventricular ejection fraction, by estimation, is 55 to 60%. The  left ventricle has normal function. The left ventricle has no regional  wall motion abnormalities. Left ventricular diastolic parameters are  consistent with Grade I diastolic dysfunction (impaired relaxation).   2. Right ventricular systolic function is normal. The right ventricular  size is normal. Tricuspid regurgitation signal is inadequate for assessing  PA pressure.   3. Left atrial size was moderately dilated.   4. Right atrial size was mildly dilated.   5. The mitral valve is normal in structure. No evidence of mitral valve  regurgitation. No evidence of mitral stenosis.   6. The aortic valve is normal in structure. Aortic valve regurgitation  is  not visualized. Aortic valve sclerosis/calcification is present, without  any evidence of aortic stenosis.   7. The inferior vena cava is normal in size with greater than 50%  respiratory variability, suggesting right atrial pressure of 3 mmHg.   MRI brain: Positive for scattered small acute infarcts in the Right MCA territory, from the posterior right external capsule, through the corona radiata and to the postcentral gyrus. Additional superimposed small contralateral left anterior frontal lobe white matter lacunar infarct. No associated hemorrhage or mass effect. Subtle chronic left PCA territory infarct at the occipital pole. Otherwise mild for age underlying chronic small vessel disease including at the right caudate, brainstem.    Impression  62 year old male with a medical history significant for stage IV cholangiocarcinoma, antiphospholipid syndrome, A-fib, recently switched from Coumadin to Eliquis (last dose of Eliquis the night PTA), hypertension and diabetes who presented on Sunday with acute onset of left-sided weakness. The patient was not a candidate for TNK or mechanical thrombectomy.  - Exam today: Reveals deficits localizable to the right MCA territory.  - MRI brain reveals multifocal small acute embolic infarcts in the right MCA territory.  - TTE: EF 55-60%. Moderate left atrial enlargement. No valvular vegetation or mural thrombus mentioned in the report.  - CTA of head and neck: Generalized diminutive appearance of all circle-of-Willis branches, etiology unclear. But asymmetric absence of right MCA M2 branches compatible with ELVO in the setting of left side deficit. Metastatic cholangiocarcinoma. Large T4 thoracic lytic metastasis with epidural and extraosseous tumor extension asymmetric to the right. Heavily calcified bilateral ICA siphons and vertebral artery V4 segments with generalized mild to moderate stenosis. But mild for age extracranial atherosclerosis. No  significant atherosclerotic stenosis in the neck.  - While anticoagulation increases the risk of hemorrhagic conversion we feel that the benefit  of anticoagulation for stroke risk reduction outweighs the risk of hemorrhage because 1) infarcts are numerous but small, 2) strokes are clearly embolic, 3) in addition to his a fib he also has multiple reasons to be hypercoagulable including antiphospholipid antibody syndrome and malignancy. - HgbA1c is normal at 5.4 - HDL is low at 32. Lipid panel is otherwise normal - AST, ALT and T. Bili are normal - BUN and Cr are normal   Recommendations    - Other than further data from continued cardiac telemetry, stroke work up has been completed.  - Permissive HTN x48 hrs from sx onset goal BP <180/110 (since on anticoagulation). PRN labetalol or hydralazine if BP above these parameters. Avoid oral antihypertensives. - Continue pravastatin - Heparin gtt can be given for anticoagulation in the acute post-stroke period, but switching back to his oral anticoagulant is preferable due to lower bleeding risk - q4 hr neuro checks - STAT head CT for any change in neuro exam - Cardiac telemetry - PT/OT/SLP - Stroke education - Amb referral to neurology upon discharge - Neurohospitalist service will sign off. Please call if there are additional questions.    LOS: 1 day   @Electronically  signed: Dr. Caryl Pina 09/23/2023  5:18 PM

## 2023-09-23 NOTE — Assessment & Plan Note (Addendum)
Causing left-sided weakness.  Patient also has PCA left side embolic stroke.  PT and OT consultations appreciated.  Patient did not do well with the swallow exam initially.  Patient switched over to dysphagia 2 diet with nectar thick liquids.  Heparin drip switched over to Lovenox injections as per oncology.  Lovenox injections prescribed ongoing home with hospice.  Since patient going home with hospice cholesterol medication discontinued.

## 2023-09-23 NOTE — Progress Notes (Signed)
*  PRELIMINARY RESULTS* Echocardiogram 2D Echocardiogram has been performed.  Carolyne Fiscal 09/23/2023, 3:50 PM

## 2023-09-23 NOTE — Consult Note (Signed)
Palliative Medicine [facility] at [facility] Telephone:([phone]) [phone] [phone]) [phone]   Name: Christopher Marsh Date: 09/23/2023 MRN: [id number]  DOB: 03/06/1961  Patient Care Team: Nelwyn Salisbury, MD as PCP - General (Family Medicine) Mariah Milling Tollie Pizza, MD as PCP - Cardiology (Cardiology) [name], MD as Consulting Physician (Hematology and Oncology) Georgiana Spinner, NP as Nurse Practitioner (Vascular Surgery) Benita Gutter, RN as Oncology Nurse Navigator Carmina Miller, MD as Consulting Physician (Radiation Oncology)    REASON FOR CONSULTATION: Christopher Marsh is a 62 y.o. male with multiple medical problems including stage IV cholangiocarcinoma. Patient has had disease progression on multiple previous lines of treatment.  Patient has poorly tolerated treatment.  He was recently switched from warfarin to Eliquis.  He was hospitalized 09/22/2023 with left-sided weakness with MRI of the brain showing right MCA and left PCA infarcts.  He is referred to palliative care to address goals and manage ongoing symptoms.   SOCIAL HISTORY:     reports that he quit smoking about 42 years ago. His smoking use included cigarettes. He started smoking about 48 years ago. He has a 3 pack-year smoking history. He has never used smokeless tobacco. He reports that he does not currently use alcohol. He reports that he does not use drugs.  Patient is married and lives at home with his wife.  ADVANCE DIRECTIVES:  On file  CODE STATUS: DNR  PAST MEDICAL HISTORY: Past Medical History:  Diagnosis Date   Antiphospholipid antibody syndrome (HCC)    Arthritis    Cancer (HCC)    Diabetes mellitus without complication (HCC)    DVT (deep venous thrombosis) (HCC)    Lt leg   Dyspnea    Elevated lipids    Erythrocytosis    Hyperlipidemia    Hypertension    Lower extremity edema    Multinodular thyroid    Post-thrombotic syndrome     PAST SURGICAL HISTORY:  Past  Surgical History:  Procedure Laterality Date   CARDIOVERSION N/A 03/22/2023   Procedure: CARDIOVERSION;  Surgeon: Antonieta Iba, MD;  Location: [facility] ORS;  Service: Cardiovascular;  Laterality: N/A;   COLONOSCOPY WITH PROPOFOL N/A 09/23/2018   Per Dr. Norma Fredrickson, polyp, repeat in 3 yrs    EYE MUSCLE SURGERY     JOINT REPLACEMENT Left 07/22/2017   PORTA CATH INSERTION N/A 12/24/2022   Procedure: PORTA CATH INSERTION;  Surgeon: Annice Needy, MD;  Location: [facility] INVASIVE CV LAB;  Service: Cardiovascular;  Laterality: N/A;   TOTAL HIP ARTHROPLASTY Left 07/22/2017   Procedure: TOTAL HIP ARTHROPLASTY;  Surgeon: Donato Heinz, MD;  Location: [facility] ORS;  Service: Orthopedics;  Laterality: Left;    HEMATOLOGY/ONCOLOGY HISTORY:  Oncology History  Cholangiocarcinoma (HCC)  10/30/2021 Imaging   CT chest wo contrast 1. Heterogeneous bilateral ground-glass disease, multilobar but worst in the right middle lobe, lingula and lower lobes. There is progression of the abnormality since 10/12/2022. Though pneumonia could produce this appearance, given persistence and fluctuating appearance since October, consider alternative entities such as hypersensitivity pneumonitis or idiopathic interstitial pneumonia. 2. Slightly enlarged pulmonary trunk, possible arterial hypertension 3. Redemonstrated large hypodense right hepatic lobe mass.   08/18/2022 Imaging   CT chest w contrast showed 1. Multifocal airspace disease in both lungs, left side greater than right. Findings are most compatible with multifocal pneumonia. 2. Multiple new hepatic lesions with enlarging lymph nodes in the upper abdomen and chest. Findings are concerning for metastatic disease. Based on  the multifocal pneumonia, hepatic abscesses would also be in the differential diagnosis. Recommend further characterization of the abdomen and pelvis with CT with IV contrast. 3. Enlargement of the main pulmonary artery could be associated with pulmonary  hypertension. 4. Coronary artery calcifications. 5. Multinodular goiter. Patient has known thyroid nodules and previous thyroid ultrasound.    08/18/2022 Imaging   CT abdomen pelvis w contrast  Interval development of 6.5 x 5.2 cm heterogeneously enhancing mass in right hepatic lobe. This is highly concerning for neoplasm or malignancy, and further evaluation with MRI is recommended.   Mildly enlarged periaortic adenopathy is noted, with the largest lymph node measuring 12 mm. Metastatic disease cannot be excluded. Mildly enlarged adenopathy is also noted in the gastrohepatic ligament, but this is unchanged compared to prior exam.   Stable bibasilar lung opacities are noted concerning for inflammation, atelectasis or possibly scarring.   Grossly stable multi-septated cystic lesion is seen involving upper pole of right kidney with peripheral calcifications compared to prior exam of 2019, most likely representing benign etiology.   Aortic Atherosclerosis    08/18/2022 - 08/21/2022 Hospital Admission   Admitted due to multifocal pneumonia, treated with Influenza panel negative, COVID-negative, HIV negative, Complete course of antibiotics with Zithromax, Vantin     08/19/2022 Imaging   MR abdomen w wo contrast  Marked caudate lobe hypertrophy, highly suspicious for hepatic cirrhosis.   Numerous hypervascular masses throughout the right hepatic lobe, highly suspicious for multifocal hepatocellular carcinoma. Recommend correlation with AFP and consider tissue sampling.   Mild abdominal lymphadenopathy, with differential diagnosis including metastatic disease and reactive lymphadenopathy in setting of cirrhosis.   Bilateral lower lobe infiltrates, as better demonstrated on recent CT.    08/19/2022 Tumor Marker   AFP 4.1   08/27/2022 Initial Diagnosis   Cholangiocarcinoma  -08/20/22 Liver mass biopsy showed poorly differentiated carcinoma Immunohistochemical stains show that the poorly  differentiated carcinoma is positive for CK7 and MOC-31, suggestive of a poorly differentiated adenocarcinoma.  Immunohistochemical stains for CK20, CDX2, HepPar 1 and arginase are negative.  Immunostain for glypican-3 shows very focal  likely nonspecific labeling.  This immunoprofile is nonspecific but can be compatible with a poorly differentiated primary cholangiocarcinoma in absence of any other lesions.   NGS showed BAP 1 mutation.  Case was presented at tumor board, I have also discussed with pathologist Dr.Rubinas,  IHC pattern is more consistent with adenocarcinoma, unlikely HCC [due to negative HepPar 1 and arginase], cholangiocarcinoma is favored, although this is a diagnosis of exclusion. Second opinion ar [facility]   08/27/2022 Cancer Staging   Staging form: Intrahepatic Bile Duct, AJCC 8th Edition - Clinical stage from 08/27/2022: Stage IV (cT2, cN1, cM1) - Signed by [name], MD on 09/13/2022 Stage prefix: Initial diagnosis   08/27/2022 Tumor Marker   CA 19.9  16 CEA 2.8   09/12/2022 Imaging   PET scan showed 1. Large right hepatic lobe mass is hypermetabolic and consistent with known cholangiocarcinoma. 2. Scattered borderline enlarged upper abdominal lymph nodes do not show any significant FDG uptake. 3. 4 hypermetabolic bone lesions consistent with metastatic disease. 4. Progressive diffuse airspace process in the left lung could be due to atypical pneumonia, pulmonary hemorrhage or left-sided aspiration. 5. Aortic atherosclerosis.  09/12/2022 I had a phone discussion with patient after his PET scan resulted.  Recommend systemic chemotherapy.  Patient would like to defer until his second opinion visit at [facility].   09/15/2022 Imaging   CT angio chest pulmonary embolism protocol showed 1. No  evidence for pulmonary embolism. 2. Progression of bilateral multifocal patchy ground-glass and interstitial opacities, left greater than right. Findings are concerning for multifocal  pneumonia. 3. Air-fluid levels in the left lower lobe bronchi worrisome for aspiration. 4. Mediastinal and hilar lymphadenopathy has increased in size and number when compared to the prior study, likely reactive.5. Cardiomegaly. 6. Findings compatible with pulmonary artery hypertension.7. Stable right liver mass and complex right renal cystic lesion. 8. Stable enlargement of the left thyroid.   09/15/2022 - 09/17/2022 Hospital Admission   Patient was admitted due to shortness of breath, CT showed progression of bilateral multifocal patchy groundglass and interstitial opacities.  Left greater than right.  Concerning for multifocal pneumonia.  Patient was started on broad-spectrum antibiotics with significant improvement.  He was discharged on oral antibiotics.   09/19/2022 Miscellaneous   Patient went to [facility] for second opinion.  He was seen by Shirlee Latch, who felt that the presentation is consistent with metastatic cholangiocarcinoma, and agrees with the plan of cisplatin/gemcitabine/durvalumab..    10/11/2022 - 01/31/2023 Chemotherapy   Patient is on Treatment Plan : Cisplatin D1,8 + Gemcitabine + Durvalumab  D1,8 q21d x     10/12/2022 Imaging   CT chest w contrast 1. Bilateral airspace and ground-glass opacities have mildly decreased in amount and density when compared to the prior study. Findings are compatible with resolving multifocal pneumonia. Continued follow-up imaging recommended. 2. Stable enlarged heterogeneous left thyroid gland with 3.2 cm nodule. This can be further evaluated with dedicated ultrasound. 3. Stable hepatic and right renal lesions, incompletely evaluated.   Aortic Atherosclerosis (ICD10-I70.0).   12/11/2022 Imaging   PET showed  1. There is been mild increase in size of the tracer avid mass within right lobe of liver compatible with known cholangiocarcinoma. The degree of tracer uptake however is slightly decreased in the interval. 2. Multifocal tracer avid bone  metastases are again noted. When compared with the previous exam there has been an mixed interval response to therapy. Specifically, there has been interval increase in size and FDG uptake associated with the right ischial tuberosity lesion and there is a new lesion within the posterior left iliac bone. Decreased tracer uptake is associated with the left scapular lesion and there is stable tracer uptake associated with the right T1 transverse process lesion. 3. Persistent ground-glass and airspace densities within the left upper lobe and left lower lobe compatible with inflammatory or infectious process. Interval resolution of previous right lung ground-glass and airspace densities.    12/24/2022 Procedure   Medi port placement by Dr. Wyn Quaker   01/22/2023 Imaging   CT chest abdomen pelvis with contrast 1. Hypodense lesion of the right lobe of the liver, hepatic segment VII/VIII is slightly diminished in size, consistent with treatment response. 2. No evident change in additional smaller hypodense lesions scattered throughout the right lobe of the liver, these more difficult to see on prior noncontrast PET-CT. 3. Unchanged enlarged celiac axis, gastrohepatic ligament, and retroperitoneal lymph nodes. Unchanged prominent high right paratracheal lymph nodes. 4. Unchanged osseous metastases. 5. Slightly improved, although still extensive ground-glass airspace opacity primarily seen throughout the left lower lobe and lingula,nonspecific and infectious or inflammatory. 6. Coronary artery disease. 7. Emphysema.   02/11/2023 Imaging   Patient self decreased Lovenox dosage to 120mg  once daily.  Increased lower extremity swelling.   02/11/23 [place] bilateral lower extremity showed 1. Nonocclusive DVT within the popliteal veins bilaterally. 2. Occlusive DVT within the posterior tibial veins of the right calf.  02/13/2023 Imaging   CT chest Angiogram  1. Technically adequate exam showing no acute pulmonary  embolus. 2. Cardiomegaly. Coronary artery calcifications. 3. Bilateral pleural effusions. 4. Improved aeration of the RIGHT UPPER and LOWER lobes compared to prior study. 5. LEFT LOWER lobe and lingula airspace filling and atelectasis, slightly improved compared to prior study. 6. Cirrhotic morphology of the liver. 7.7 centimeters mass in the RIGHT hepatic lobe. 7. Gastrohepatic ligament adenopathy. 8. A 7 millimeters sclerotic lesion in the UPPER sternum is suspicious for metastatic disease. 9. LEFT thyroid goiter.   02/13/2023 - 02/15/2023 Hospital Admission   A flutter with variable A-V block. Heart rate up to 140.  BNP 119.8 04/18: Echo no overt CHF - EF 50-55  Cardiology recs beta blocker to metoprolol succinate 50 mg bid, Torsemid 40mg  daily Adjusting Lovenox 14mg  BID.    03/03/2023 - 05/03/2023 Chemotherapy   Futibatinib 20mg , 03/12/23 Decreased to 16mg  daily Stopped in early July due to cancer progression.   04/24/2023 - 04/30/2023 Radiation Therapy   left ischium radiation.    05/20/2023 -  Chemotherapy   Patient is on Treatment Plan : COLORECTAL FOLFOX q14d       ALLERGIES:  has No Known Allergies.  MEDICATIONS:  Current Facility-Administered Medications  Medication Dose Route Frequency Provider Last Rate Last Admin   0.9 %  sodium chloride infusion   Intravenous Continuous Mikey College T, MD 100 mL/hr at 09/23/23 0259 New Bag at 09/23/23 0259   acetaminophen (TYLENOL) tablet 650 mg  650 mg Oral Q4H PRN Emeline General, MD       Or   acetaminophen (TYLENOL) 160 MG/5ML solution 650 mg  650 mg Per Tube Q4H PRN Mikey College T, MD       Or   acetaminophen (TYLENOL) suppository 650 mg  650 mg Rectal Q4H PRN Mikey College T, MD       albuterol (PROVENTIL) (2.5 MG/3ML) 0.083% nebulizer solution 2.5 mg  2.5 mg Inhalation Q6H PRN Mikey College T, MD       dexamethasone (DECADRON) injection 8 mg  8 mg Intravenous Daily Mikey College T, MD   8 mg at 09/23/23 0844   diphenoxylate-atropine  (LOMOTIL) 2.5-0.025 MG per tablet 1 tablet  1 tablet Oral QID PRN Mikey College T, MD       heparin ADULT infusion 100 units/mL (25000 units/245mL)  900 Units/hr Intravenous Continuous Alford Highland, MD 9 mL/hr at 09/23/23 0735 900 Units/hr at 09/23/23 0735   hydrALAZINE (APRESOLINE) injection 5 mg  5 mg Intravenous Q6H PRN Mikey College T, MD       HYDROmorphone (DILAUDID) injection 0.5 mg  0.5 mg Intravenous Q4H PRN Mikey College T, MD   0.5 mg at 09/23/23 0845   insulin aspart (novoLOG) injection 0-15 Units  0-15 Units Subcutaneous TID WC Mikey College T, MD   2 Units at 09/22/23 1754   insulin aspart (novoLOG) injection 0-5 Units  0-5 Units Subcutaneous QHS Mikey College T, MD       lactulose (CHRONULAC) 10 GM/15ML solution 20 g  20 g Oral Daily PRN Mikey College T, MD       LORazepam (ATIVAN) injection 0.5 mg  0.5 mg Intravenous Q6H PRN Mikey College T, MD       LORazepam (ATIVAN) tablet 0.5 mg  0.5 mg Oral Q8H PRN Emeline General, MD       ondansetron [employer]) injection 4 mg  4 mg Intravenous Q6H PRN Emeline General, MD  pravastatin (PRAVACHOL) tablet 20 mg  20 mg Oral Q2000 Mikey College T, MD       sodium chloride flush (NS) 0.9 % injection 10 mL  10 mL Intravenous Q12H Mikey College T, MD   10 mL at 09/23/23 0847   traZODone (DESYREL) tablet 50 mg  50 mg Oral QHS PRN Emeline General, MD       triamcinolone cream (KENALOG) 0.1 % cream 1 Application  1 Application Topical TID Emeline General, MD   1 Application at 09/22/23 2247   Facility-Administered Medications Ordered in Other Encounters  Medication Dose Route Frequency Provider Last Rate Last Admin   sodium chloride flush (NS) 0.9 % injection 10 mL  10 mL Intravenous PRN [name], MD   10 mL at 07/18/23 0951    VITAL SIGNS: BP (!) 151/80 (BP Location: Left Arm)   Pulse 93   Temp 97.9 F (36.6 C)   Resp 18   Ht 6\' 2"  (1.88 m)   Wt 236 lb (107 kg)   SpO2 99%   BMI 30.30 kg/m  Filed Weights   09/22/23 0912  Weight: 236 lb (107 kg)     Estimated body mass index is 30.3 kg/m as calculated from the following:   Height as of this encounter: 6\' 2"  (1.88 m).   Weight as of this encounter: 236 lb (107 kg).  LABS: CBC:    Component Value Date/Time   WBC 6.1 09/23/2023 0542   HGB 8.0 (L) 09/23/2023 0542   HGB 8.5 (L) 09/09/2023 0843   HCT 24.0 (L) 09/23/2023 0542   PLT 114 (L) 09/23/2023 0542   PLT 119 (L) 09/09/2023 0843   MCV 92.3 09/23/2023 0542   NEUTROABS 8.4 (H) 09/22/2023 0850   LYMPHSABS 0.2 (L) 09/22/2023 0850   MONOABS 0.6 09/22/2023 0850   EOSABS 0.0 09/22/2023 0850   BASOSABS 0.0 09/22/2023 0850   Comprehensive Metabolic Panel:    Component Value Date/Time   NA 135 09/22/2023 0850   K 3.7 09/22/2023 0850   CL 102 09/22/2023 0850   CO2 21 (L) 09/22/2023 0850   BUN 12 09/22/2023 0850   CREATININE 0.88 09/22/2023 0850   CREATININE 0.77 09/12/2023 0831   GLUCOSE 158 (H) 09/22/2023 0850   CALCIUM 9.4 09/22/2023 0850   AST 24 09/22/2023 0850   AST 25 09/12/2023 0831   ALT 13 09/22/2023 0850   ALT 12 09/12/2023 0831   ALKPHOS 118 09/22/2023 0850   BILITOT 0.8 09/22/2023 0850   BILITOT 1.1 09/12/2023 0831   PROT 6.7 09/22/2023 0850   ALBUMIN 2.7 (L) 09/22/2023 0850    RADIOGRAPHIC STUDIES: MR BRAIN WO CONTRAST  Result Date: 09/22/2023 CLINICAL DATA:  62 year old male with metastatic cholangiocarcinoma, code stroke presentation with left side deficit and abnormal appearance of right MCA branches on CTA. EXAM: MRI HEAD WITHOUT CONTRAST TECHNIQUE: Multiplanar, multiecho pulse sequences of the brain and surrounding structures were obtained without intravenous contrast. COMPARISON:  CT head, CTA head and neck earlier today. FINDINGS: Brain: On DWI there is faint abnormal diffusion signal in the right corona radiata, patchy involvement of the right postcentral gyrus (series 5, images 77-83). Faint involvement of the posterior limb external capsule also. But also punctate areas of contralateral anterior left  frontal lobe subcortical white matter restricted diffusion also (series 5, image 80). Elsewhere diffusion appears to remain normal. Minimal T2 and FLAIR hyperintensity in the affected areas. No evidence of hemorrhage or mass effect. Superimposed chronic lacunar infarct of the right  caudate corresponding to the plain CT finding today (series 6, image 24). No superimposed midline shift, mass effect, evidence of mass lesion, ventriculomegaly, extra-axial collection or acute intracranial hemorrhage. Cervicomedullary junction and pituitary are within normal limits. Background gray and white matter signal is largely normal for age, however, there is subtle evidence of cortical encephalomalacia in the left occipital lobe series 9, image 24. Chronic microhemorrhage also noted in the pontomedullary junction series 10, image 14. Vascular: Major intracranial vascular flow voids are preserved. Skull and upper cervical spine: Visible bone marrow signal and cervical spine appear negative. Sinuses/Orbits: Negative orbits.  Right maxillary sinus disease. Other: Mastoids are clear. Grossly normal visible internal auditory structures. Negative visible scalp and face. IMPRESSION: 1. Positive for scattered small acute infarcts in the Right MCA territory, from the posterior right external capsule, through the corona radiata and to the postcentral gyrus. Additional superimposed small contralateral left anterior frontal lobe white matter lacunar infarct. No associated hemorrhage or mass effect. 2. Subtle chronic left PCA territory infarct at the occipital pole. Otherwise mild for age underlying chronic small vessel disease including at the right caudate, brainstem. Electronically Signed   By: Odessa Fleming M.D.   On: 09/22/2023 13:09   CT ANGIO HEAD NECK W WO CM (CODE STROKE)  Result Date: 09/22/2023 CLINICAL DATA:  Code stroke. 62 year old male left side deficit. Metastatic cholangiocarcinoma. EXAM: CT ANGIOGRAPHY HEAD AND NECK TECHNIQUE:  Multidetector CT imaging of the head and neck was performed using the standard protocol during bolus administration of intravenous contrast. Multiplanar CT image reconstructions and MIPs were obtained to evaluate the vascular anatomy. Carotid stenosis measurements (when applicable) are obtained utilizing NASCET criteria, using the distal internal carotid diameter as the denominator. RADIATION DOSE REDUCTION: This exam was performed according to the departmental dose-optimization program which includes automated exposure control, adjustment of the mA and/or kV according to patient size and/or use of iterative reconstruction technique. CONTRAST:  75mL OMNIPAQUE IOHEXOL 350 MG/ML SOLN COMPARISON:  Head CT today.  Thoracic MRI 07/18/2023. FINDINGS: CTA NECK Skeleton: Destructive thoracic vertebral metastasis at T4 redemonstrated. Epidural and extraosseous tumor there asymmetric to the right (series 5, image 190). T4 vertebral height appears stable since the September MRI. Upper chest: Right chest Port-A-Cath. Mediastinal lipomatosis. No superior mediastinal lymphadenopathy. No upper lung metastatic disease. Other neck: Heterogeneously enlarged left thyroid This has been evaluated on previous imaging. (ref: J Am Coll Radiol. 2015 Feb;12(2): 143-50).No other neck mass or lymphadenopathy. Aortic arch: 3 vessel arch.  Calcified aortic atherosclerosis. Right carotid system: Brachiocephalic artery and right CCA origin without significant plaque or stenosis. Mild calcified plaque at the right ICA origin and bulb. Right ICA is patent to the skull base without stenosis. Left carotid system: Similar mild left ICA origin atherosclerosis without stenosis. Vertebral arteries: Minimal proximal right subclavian plaque without stenosis. Normal right vertebral artery origin. Right vertebral artery is patent in the neck and to the skull base with no significant stenosis. Minimal proximal left subclavian artery plaque. Calcified  atherosclerosis at the left vertebral artery origin with only mild stenosis on series 7, image 321. Codominant left vertebral artery is patent to the skull base with no additional plaque or stenosis. CTA HEAD Posterior circulation: Heavily calcified bilateral vertebral artery V4 segments on series 7, image 155 with mild to moderate bilateral stenosis. Distal vertebrals and vertebrobasilar junction remain patent. Patent basilar artery without stenosis. Patent although diminutive SCA and PCA origins. Posterior communicating arteries are diminutive or absent. Bilateral PCA branches appear diminutive (series  8, image 21) but seem to remain patent. Anterior circulation: Both ICA siphons are patent. Cavernous and supraclinoid ICAs are heavily calcified with mild to moderate stenosis bilaterally. Patent carotid termini. Patent although diminutive MCA and ACA origins, 1st order segments (series 10, image 19). Diminutive or absent anterior communicating artery. ACA branches appear diminutive but patent. Left MCA M1 segment and bifurcation are patent without stenosis. Left MCA branches appear diminutive but patent. Contralateral right MCA M1 is patent, diminutive. Right MCA bifurcation is patent. But there are absent right MCA M2 branches, especially in the middle division is seen on series 12, image 22. Due to diminutive vessel caliber, discrete occlusion of these is difficult to identified. Venous sinuses: Faint contrast bolus, grossly patent. Anatomic variants: None significant. Review of the MIP images confirms the above findings IMPRESSION: 1. Generalized diminutive appearance of all circle-of-Willis branches, etiology unclear. But asymmetric absence of right MCA M2 branches compatible with ELVO in the setting of left side deficit. These results were communicated to Dr. Selina Cooley at 0910 hours on 09/22/2023 by text page via the [facility] messaging system. 2. Metastatic cholangiocarcinoma. Large T4 thoracic lytic metastasis with  epidural and extraosseous tumor extension asymmetric to the right. 3. Heavily calcified bilateral ICA siphons and vertebral artery V4 segments with generalized mild to moderate stenosis. 4. But mild for age extracranial atherosclerosis. No significant atherosclerotic stenosis in the neck. Electronically Signed   By: Odessa Fleming M.D.   On: 09/22/2023 09:18   CT HEAD CODE STROKE WO CONTRAST  Result Date: 09/22/2023 CLINICAL DATA:  Code stroke. 62 year old male left side deficit. Cholangiocarcinoma. EXAM: CT HEAD WITHOUT CONTRAST TECHNIQUE: Contiguous axial images were obtained from the base of the skull through the vertex without intravenous contrast. RADIATION DOSE REDUCTION: This exam was performed according to the departmental dose-optimization program which includes automated exposure control, adjustment of the mA and/or kV according to patient size and/or use of iterative reconstruction technique. COMPARISON:  Face CT 05/14/2014.  PET-CT 12/10/2022. FINDINGS: Brain: Stable cerebral volume since February. No midline shift, ventriculomegaly, mass effect, evidence of mass lesion, or acute intracranial hemorrhage. Extensive dural calcifications. Confluent but indistinct hypodensity at the anterior right basal ganglia, right caudate nucleus and adjacent frontal horn white matter series 3, image 18. But this may have been present in February. Elsewhere gray-white differentiation within normal limits. No cortically based acute infarct identified. Vascular: Advanced calcified atherosclerosis at the skull base. No suspicious intracranial vascular hyperdensity. Skull: No acute osseous abnormality identified. Sinuses/Orbits: Improved right OMC pattern of obstructive sinus disease since February. Residual mucoperiosteal thickening in the right maxillary sinus. Tympanic cavities and mastoids are clear. Other: No gaze deviation.  Calcified scalp vessel atherosclerosis. ASPECTS [facility] Stroke Program Early CT Score) Total  score (0-10 with 10 being normal): 10 IMPRESSION: 1. Age indeterminate anterior right basal ganglia lacunar infarct, but might have been present on February PET-CT. 2. No acute cortically based infarct or acute intracranial hemorrhage identified. ASPECTS 10. 3. Advanced calcified atherosclerosis. Salient findings were communicated to Dr. Selina Cooley at 8:50 am on 09/22/2023 by text page via the [facility] messaging system. Electronically Signed   By: Odessa Fleming M.D.   On: 09/22/2023 08:50   CT CHEST ABDOMEN PELVIS W CONTRAST  Result Date: 09/16/2023 CLINICAL DATA:  Cholangiocarcinoma restaging * Tracking Code: BO * EXAM: CT CHEST, ABDOMEN, AND PELVIS WITH CONTRAST TECHNIQUE: Multidetector CT imaging of the chest, abdomen and pelvis was performed following the standard protocol during bolus administration of intravenous contrast. RADIATION DOSE REDUCTION:  This exam was performed according to the departmental dose-optimization program which includes automated exposure control, adjustment of the mA and/or kV according to patient size and/or use of iterative reconstruction technique. CONTRAST:  80mL ISOVUE-300 IOPAMIDOL (ISOVUE-300) INJECTION 61% COMPARISON:  07/16/2023 FINDINGS: CT CHEST FINDINGS Cardiovascular: Stable appearance the right Port-A-Cath with tip in the SVC. Prominent pulmonary artery favoring pulmonary arterial hypertension. Coronary and aortic atheromatous vascular calcifications. Mediastinum/Nodes: Scattered mediastinal lymph nodes are not overtly pathologically enlarged. Stable left thyroid nodule up to about 3.8 cm in long axis on image 9 series 2. In the setting of significant comorbidities or limited life expectancy, no follow-up recommended (ref: J Am Coll Radiol. 2015 Feb;12(2): 143-50). Lungs/Pleura: Mild scattered scarring in the lungs. Musculoskeletal: There is lytic lesions include the left scapula, the right ninth rib posteriorly, and especially the T4 vertebral body where there is a large lytic  lesion encompassing most of the vertebral body especially eccentric to the right, with paraspinal component and increased epidural component which could be impinging on the thoracic cord. The T9 lytic lesion measures about 3.4 cm anterior-posterior, previously 2.8 cm. Left T12 posterior element involvement extending in the pedicle, adjacent vertebral body, lamina, and spinous process. There is also a separate metastatic lesion in the right T12 pedicle. CT ABDOMEN PELVIS FINDINGS Hepatobiliary: Nodular liver contour. The heterogeneous but primarily hypodense right hepatic lobe mass measures 7.4 by 5.8 cm on image 67 series 2, stable. Minor areas of satellite nodularity/hypodensity around this lesion are similar to prior. There is some mild hypodensity in segment 4a and a small hypodense lesion in segment 2 both which appear stable. Gallbladder unremarkable. Pancreas: Unremarkable Spleen: Measures 13.2 by 12.1 by 9.1 cm (volume = 760 cm^3), compatible with splenomegaly. Adrenals/Urinary Tract: Adrenal glands unremarkable. Stable benign Bosniak category 2 cyst of the right kidney upper pole with capsular calcifications, as worked up on prior exams. Right kidney lower pole benign cyst and small left renal cysts. No worrisome renal lesion. Urinary bladder partially obscured by streak artifact from the left hip implant. Stomach/Bowel: Unremarkable Vascular/Lymphatic: Atherosclerosis is present, including aortoiliac atherosclerotic disease. Right gastric node 1.1 cm in short axis on image 66 series 2, formerly 1.3 cm. Aortocaval node 1.0 cm in short axis on image 83 series 2, formerly 1.1 cm. Other similar borderline enlarged lymph nodes are present. Patent portal vein and patent splenic vein. Reproductive: Obscured by streak artifact from the left hip prosthesis. Grossly unremarkable. Other: Increased ascites, currently moderate. Increased mesenteric and omental edema without overt tumor nodularity. Musculoskeletal:  Stable right L4 lytic lesion. Stable mixed density lesion of the right iliac bone. Stable lytic lesions involving the left acetabulum and left ischium. Right acetabular mass measures 5.2 cm in long axis, formerly 4.1 cm. Lytic metastatic lesion in the right pubic bone measures 2.6 cm in diameter, previously 1.9 cm in long axis. Mixed density lesion of the right ischial tuberosity appears stable. Chronic avascular necrosis of the right femoral head. Left total hip prosthesis. IMPRESSION: 1. Increased size of lytic metastatic lesions in the T9 vertebral body, right acetabulum, and right pubic bone. The T9 lytic lesion has a large epidural component which could be impinging on the thoracic cord. Consider thoracic spine MRI without and with contrast to assess for cord impingement. 2. Stable size of the right hepatic lobe mass. 3. Increased ascites, currently moderate. Increased mesenteric and omental edema without overt tumor nodularity along peritoneal surfaces. 4. Stable borderline enlarged upper abdominal and retroperitoneal lymph nodes. 5. Chronic avascular  necrosis of the right femoral head. 6. Prominent pulmonary artery favoring pulmonary arterial hypertension. 7. Aortic and coronary atherosclerosis. Aortic Atherosclerosis (ICD10-I70.0). Electronically Signed   By: Gaylyn Rong M.D.   On: 09/16/2023 17:09    PERFORMANCE STATUS (ECOG) : 3 - Symptomatic, >50% confined to bed  Review of Systems Unless otherwise noted, a complete review of systems is negative.  Physical Exam General: NAD Pulmonary: Unlabored Extremities: no edema, no joint deformities Skin: no rashes Neurological: L sided Weakness  IMPRESSION: Patient with stage IV cholangiocarcinoma with osseous metastasis.  CT of the chest abdomen and pelvis on 09/10/2023 showed increased size of lytic metastatic lesions in T9, right acetabulum, and right pubic bone.  T9 lytic lesion had a large epidural component.  Patient also noted to have  increased ascites.  Patient now admitted with acute CVA, likely due to failure of Eliquis.  Patient currently working with SLP.  I met privately with patient's wife.  Patient has had disease progression on multiple previous lines of chemotherapy.  His performance status had been declining even prior to this stroke.  Wife says that they have discussed trial of rehab but she recognizes that patient may not return to his previous functional baseline.  We also discussed the option of hospice involvement, as it is unclear if further cancer treatment will be feasible in light of his poor performance status and overall decline. Dr. Cathie Hoops plans to see patient today to discuss options with patient/family.   PLAN: -Continue current scope of treatment -Will follow  Case and plan discussed with Dr. Cathie Hoops  Time Total: 30 minutes  Visit consisted of counseling and education dealing with the complex and emotionally intense issues of symptom management and palliative care in the setting of serious and potentially life-threatening illness.Greater than 50%  of this time was spent counseling and coordinating care related to the above assessment and plan.  Signed by: Laurette Schimke, PhD, NP-C

## 2023-09-23 NOTE — Assessment & Plan Note (Addendum)
Stage IV.  Metastases to spine.  Oncology and palliative care consultations.

## 2023-09-24 ENCOUNTER — Other Ambulatory Visit: Payer: Self-pay

## 2023-09-24 ENCOUNTER — Encounter: Payer: Self-pay | Admitting: Oncology

## 2023-09-24 ENCOUNTER — Other Ambulatory Visit (HOSPITAL_COMMUNITY): Payer: Self-pay

## 2023-09-24 ENCOUNTER — Ambulatory Visit: Payer: BC Managed Care – PPO

## 2023-09-24 DIAGNOSIS — C221 Intrahepatic bile duct carcinoma: Secondary | ICD-10-CM | POA: Diagnosis not present

## 2023-09-24 DIAGNOSIS — I63411 Cerebral infarction due to embolism of right middle cerebral artery: Secondary | ICD-10-CM | POA: Diagnosis not present

## 2023-09-24 DIAGNOSIS — I483 Typical atrial flutter: Secondary | ICD-10-CM | POA: Diagnosis not present

## 2023-09-24 DIAGNOSIS — D6861 Antiphospholipid syndrome: Secondary | ICD-10-CM | POA: Diagnosis not present

## 2023-09-24 LAB — HEPARIN LEVEL (UNFRACTIONATED): Heparin Unfractionated: 0.62 [IU]/mL (ref 0.30–0.70)

## 2023-09-24 LAB — BASIC METABOLIC PANEL
Anion gap: 8 (ref 5–15)
BUN: 14 mg/dL (ref 8–23)
CO2: 21 mmol/L — ABNORMAL LOW (ref 22–32)
Calcium: 8.5 mg/dL — ABNORMAL LOW (ref 8.9–10.3)
Chloride: 109 mmol/L (ref 98–111)
Creatinine, Ser: 0.74 mg/dL (ref 0.61–1.24)
GFR, Estimated: >60 mL/min (ref >60)
Glucose, Bld: 163 mg/dL — ABNORMAL HIGH (ref 70–99)
Potassium: 3.3 mmol/L — ABNORMAL LOW (ref 3.5–5.1)
Sodium: 138 mmol/L (ref 135–145)

## 2023-09-24 LAB — APTT: aPTT: 72 s — ABNORMAL HIGH (ref 24–36)

## 2023-09-24 LAB — GLUCOSE, CAPILLARY
Glucose-Capillary: 141 mg/dL — ABNORMAL HIGH (ref 70–99)
Glucose-Capillary: 131 mg/dL — ABNORMAL HIGH (ref 70–99)

## 2023-09-24 LAB — CBC
HCT: 25.3 % — ABNORMAL LOW (ref 39.0–52.0)
Hemoglobin: 8.2 g/dL — ABNORMAL LOW (ref 13.0–17.0)
MCH: 30.4 pg (ref 26.0–34.0)
MCHC: 32.4 g/dL (ref 30.0–36.0)
MCV: 93.7 fL (ref 80.0–100.0)
Platelets: 85 10*3/uL — ABNORMAL LOW (ref 150–400)
RBC: 2.7 MIL/uL — ABNORMAL LOW (ref 4.22–5.81)
RDW: 15.1 % (ref 11.5–15.5)
WBC: 4.7 10*3/uL (ref 4.0–10.5)
nRBC: 0 % (ref 0.0–0.2)

## 2023-09-24 MED ORDER — ENSURE ENLIVE PO LIQD: 237.0000 mL | Freq: Two times a day (BID) | ORAL | 0 refills | Status: DC

## 2023-09-24 MED ORDER — METOPROLOL TARTRATE 50 MG PO TABS
50.0000 mg | ORAL_TABLET | Freq: Two times a day (BID) | ORAL | 0 refills | Status: DC
  Filled 2023-09-24 (×2): qty 60, 30d supply, fill #0

## 2023-09-24 MED ORDER — METOPROLOL TARTRATE 5 MG/5ML IV SOLN: 5.0000 mg | INTRAVENOUS | Status: DC | PRN

## 2023-09-24 MED ORDER — OXYCODONE HCL 5 MG PO TABS: 10.0000 mg | ORAL_TABLET | ORAL | Status: DC | PRN

## 2023-09-24 MED ORDER — ENOXAPARIN SODIUM 100 MG/ML IJ SOSY
100.0000 mg | PREFILLED_SYRINGE | Freq: Two times a day (BID) | INTRAMUSCULAR | 0 refills | Status: DC
  Filled 2023-09-24: qty 60, 30d supply, fill #0

## 2023-09-24 MED ORDER — ENOXAPARIN SODIUM 100 MG/ML IJ SOSY
100.0000 mg | PREFILLED_SYRINGE | Freq: Two times a day (BID) | INTRAMUSCULAR | Status: DC
  Administered 2023-09-24: 100 mg via SUBCUTANEOUS
  Filled 2023-09-24 (×2): qty 1

## 2023-09-24 MED ORDER — ENSURE ENLIVE PO LIQD
237.0000 mL | Freq: Two times a day (BID) | ORAL | 0 refills | Status: DC
  Filled 2023-09-24: qty 14220, 30d supply, fill #0

## 2023-09-24 MED ORDER — METOPROLOL TARTRATE 50 MG PO TABS
50.0000 mg | ORAL_TABLET | Freq: Two times a day (BID) | ORAL | Status: DC
  Administered 2023-09-24: 50 mg via ORAL
  Filled 2023-09-24: qty 1

## 2023-09-24 MED ORDER — HYDROMORPHONE HCL 1 MG/ML IJ SOLN
0.5000 mg | Freq: Once | INTRAMUSCULAR | Status: AC
  Administered 2023-09-24: 0.5 mg via INTRAVENOUS
  Filled 2023-09-24: qty 0.5

## 2023-09-24 MED ORDER — MORPHINE SULFATE (CONCENTRATE) 10 MG /0.5 ML PO SOLN: 5.0000 mg | ORAL | Status: DC | PRN

## 2023-09-24 MED ORDER — MORPHINE SULFATE (CONCENTRATE) 10 MG /0.5 ML PO SOLN
5.0000 mg | ORAL | 0 refills | Status: DC | PRN
  Filled 2023-09-24: qty 30, 7d supply, fill #0

## 2023-09-24 NOTE — Progress Notes (Signed)
OT Cancellation Note  Patient Details Name: Christopher Marsh MRN: [id number] DOB: [birth year]   Cancelled Treatment:    Reason Eval/Treat Not Completed: Other (comment). Chart reviewed, pt now returning home with hospice. On comfort measures only. Will complete OT order at this time.   Arman Filter., MPH, MS, OTR/L ascom [phone] 09/24/23, 1:18 PM

## 2023-09-24 NOTE — Consult Note (Signed)
PHARMACY - ANTICOAGULATION CONSULT NOTE  Pharmacy Consult for Heparin Infusion  Indication: APLS with recurrent DVT & atrial fibrillation   Patient Measurements: Height: 6\' 2"  (188 cm) Weight: 107 kg (236 lb) IBW/kg (Calculated) : 82.2 Heparin Dosing Weight: 104 kg  Vital Signs: Temp: 98.6 F (37 C) (11/26 0350) BP: 152/78 (11/26 0350) Pulse Rate: 100 (11/26 0350)  Labs: Recent Labs    09/22/23 0850 09/23/23 0542 09/23/23 1519 09/23/23 2305 09/24/23 0555  HGB 9.7* 8.0*  --   --  8.2*  HCT 29.7* 24.0*  --   --  25.3*  PLT 149* 114*  --   --  85*  APTT 34  --  45* 64* 72*  LABPROT 19.2*  --   --   --   --   INR 1.6*  --   --   --   --   HEPARINUNFRC  --  >1.10* 0.94*  --  0.62  CREATININE 0.88  --   --   --   --    Estimated Creatinine Clearance: 110.5 mL/min (by C-G formula based on SCr of 0.88 mg/dL).  Medical History: Past Medical History:  Diagnosis Date   Antiphospholipid antibody syndrome (HCC)    Arthritis    Cancer (HCC)    Diabetes mellitus without complication (HCC)    DVT (deep venous thrombosis) (HCC)    Lt leg   Dyspnea    Elevated lipids    Erythrocytosis    Hyperlipidemia    Hypertension    Lower extremity edema    Multinodular thyroid    Post-thrombotic syndrome    Medications:  PTA Eliquis - last dose taken 11/23 PM at 1900   Assessment: Christopher Marsh is a 62 y.o. male on chronic anticoagulation for antiphospholipid syndrome with recurrent DVTs and atrial fibrillation. PMH is significant for metastatic cholangiocarcinoma. Patient's oncologist recently switched him from warfarin to Eliquis to improve quality of life. Pharmacy has been consulted to initiate and manage heparin infusion.   Baseline Labs: aPTT 34, PT 19.2, INR 1.6, Hgb 9.7, Hct 29.7, Plt 149   Heparin level remains supratherapeutic at 0.92 on 900 units/hour, aPTT level is subtherapeutic at 45 sec. No issues with infusion reported, no signs/symptoms of bleeding noted.  11/26  0555 aPTT 72, therapeutic x 1 / HL 0.62 not correlating  Goal of Therapy:  Heparin level 0.3-0.5 units/ml, no boluses  aPTT level 66-85 seconds Monitor platelets by anticoagulation protocol: Yes   Plan:  No boluses (per neurology) Continue heparin infusion at 1200 units/hour Recheck aPTT level in 6 hrs to confirm, then daily Continue to monitor aPTT levels until correlating with Anti-Xa levels Continue to monitor H&H and platelets daily while on heparin infusion   Otelia Sergeant, PharmD, [facility] 09/24/2023 7:02 AM

## 2023-09-24 NOTE — Progress Notes (Signed)
PHARMACY - ANTICOAGULATION CONSULT NOTE  Pharmacy Consult for Lovenox Indication: atrial fibrillation  No Known Allergies  Patient Measurements: Height: 6\' 2"  (188 cm) Weight: 107 kg (236 lb) IBW/kg (Calculated) : 82.2 Heparin Dosing Weight:    Vital Signs: Temp: 98.1 F (36.7 C) (11/26 0750) BP: 131/67 (11/26 0750) Pulse Rate: 109 (11/26 0750)  Labs: Recent Labs    09/22/23 0850 09/23/23 0542 09/23/23 1519 09/23/23 2305 09/24/23 0555  HGB 9.7* 8.0*  --   --  8.2*  HCT 29.7* 24.0*  --   --  25.3*  PLT 149* 114*  --   --  85*  APTT 34  --  45* 64* 72*  LABPROT 19.2*  --   --   --   --   INR 1.6*  --   --   --   --   HEPARINUNFRC  --  >1.10* 0.94*  --  0.62  CREATININE 0.88  --   --   --  0.74    Estimated Creatinine Clearance: 121.5 mL/min (by C-G formula based on SCr of 0.74 mg/dL).   Medical History: Past Medical History:  Diagnosis Date   Antiphospholipid antibody syndrome (HCC)    Arthritis    Cancer (HCC)    Diabetes mellitus without complication (HCC)    DVT (deep venous thrombosis) (HCC)    Lt leg   Dyspnea    Elevated lipids    Erythrocytosis    Hyperlipidemia    Hypertension    Lower extremity edema    Multinodular thyroid    Post-thrombotic syndrome     Medications:  Medications Prior to Admission  Medication Sig Dispense Refill Last Dose   albuterol (VENTOLIN HFA) 108 (90 Base) MCG/ACT inhaler TAKE 2 PUFFS BY MOUTH EVERY 4 HOURS AS NEEDED 8.5 each 1 prn   amoxicillin-clavulanate (AUGMENTIN) 875-125 MG tablet Take 1 tablet by mouth 2 (two) times daily. 14 tablet 0 09/21/2023   apixaban (ELIQUIS) 5 MG TABS tablet Take 1 tablet (5 mg total) by mouth 2 (two) times daily. 60 tablet 1 09/21/2023 at 1900   Calcium Carbonate 500 MG CHEW Chew 2 tablets (1,000 mg total) by mouth daily. 60 tablet 0 09/21/2023   [START ON 09/27/2023] dexamethasone (DECADRON) 4 MG tablet Take 1 tablet (4 mg total) by mouth daily. 14 tablet 0 09/21/2023    diphenoxylate-atropine (LOMOTIL) 2.5-0.025 MG tablet Take 1 tablet by mouth 4 (four) times daily as needed for diarrhea or loose stools. 120 tablet 3 prn   glipiZIDE (GLUCOTROL) 10 MG tablet TAKE 1 TABLET (10 MG TOTAL) BY MOUTH TWICE A DAY BEFORE A MEAL 180 tablet 3 09/21/2023   ketoconazole (NIZORAL) 2 % cream Apply 1 Application topically 2 (two) times daily as needed for irritation. 60 g 5 09/21/2023   Lactulose 20 GM/30ML SOLN Take 30 mLs (20 g total) by mouth daily as needed (constipation.). 450 mL 0 prn   metFORMIN (GLUCOPHAGE) 500 MG tablet TAKE TWO TABLETS BY MOUTH EVERY MORNING WITH BREAKFAST 180 tablet 1 09/21/2023   metoprolol tartrate (LOPRESSOR) 50 MG tablet Take 1 tablet (50 mg total) by mouth 2 (two) times daily. 180 tablet 3 09/21/2023   MUCINEX D MAX STRENGTH [id number] MG TB12 Take 1,200 mg by mouth 2 (two) times daily as needed (for congestion or to loosen mucous in the chest).   prn   ondansetron (ZOFRAN) 4 MG tablet Take by mouth.   prn   oxyCODONE (OXY IR/ROXICODONE) 5 MG immediate release tablet Take 1 tablet (  5 mg total) by mouth every 4 (four) hours as needed for severe pain. 60 tablet 0 prn   phytonadione (MEPHYTON) 5 MG tablet TAKE 1/2 TABLET (2.5 MG) BY MOUTH ONLY IF ADVISED BY ANTICOAGULATION CLINIC TO TAKE 1 tablet 0    potassium chloride SA (KLOR-CON M) 20 MEQ tablet Take 1 tablet (20 mEq total) by mouth 2 (two) times daily. 10 tablet 0 09/21/2023   pravastatin (PRAVACHOL) 20 MG tablet TAKE 1 TABLET BY MOUTH EVERY DAY 90 tablet 3 09/21/2023   prochlorperazine (COMPAZINE) 10 MG tablet Take 10 mg by mouth every 6 (six) hours as needed.   prn   silver sulfADIAZINE (SILVADENE) 1 % cream Apply 1 Application topically daily. 50 g 0    SLOW-MAG 71.5-119 MG TBEC SR tablet TAKE 1 TABLET (64 MG TOTAL) BY MOUTH DAILY 60 tablet 1 09/21/2023   torsemide (DEMADEX) 20 MG tablet Take 1 tablet (20 mg total) by mouth 2 (two) times daily as needed. 180 tablet 3 09/21/2023   traZODone  (DESYREL) 50 MG tablet TAKE 1 TABLET BY MOUTH AT BEDTIME AS NEEDED FOR SLEEP. 90 tablet 0 09/21/2023   triamcinolone cream (KENALOG) 0.1 % Apply 1 Application topically 3 (three) times daily. 80 g 5 Past Week   dexamethasone (DECADRON) 4 MG tablet Take 2 tablets (8 mg total) by mouth daily. 14 tablet 0    fentaNYL (DURAGESIC) 50 MCG/HR Place 1 patch onto the skin every 3 (three) days. 10 patch 0    glucose blood test strip 1 each by Other route as needed for other. accu chek aviva plusUse as instructed      lidocaine-prilocaine (EMLA) cream Apply 1 Application topically as needed. (Patient not taking: Reported on 09/22/2023) 30 g 5 Not Taking   LORazepam (ATIVAN) 0.5 MG tablet TAKE 1 TABLET (0.5 MG TOTAL) BY MOUTH EVERY 8 (EIGHT) HOURS AS NEEDED FOR ANXIETY (NAUSEA VOMITING). (Patient not taking: Reported on 09/22/2023) 60 tablet 0 Not Taking   potassium chloride (KLOR-CON) 10 MEQ tablet Take 1 tablet (10 mEq total) by mouth daily. 90 tablet 3    tadalafil (CIALIS) 20 MG tablet Take 0.5-1 tablets (10-20 mg total) by mouth every other day as needed for erectile dysfunction. (Patient not taking: Reported on 09/22/2023) 20 tablet 11 Not Taking   Scheduled:   dexamethasone (DECADRON) injection  8 mg Intravenous Daily   enoxaparin (LOVENOX) injection  100 mg Subcutaneous Q12H   feeding supplement  237 mL Oral BID BM   fentaNYL  1 patch Transdermal Q72H   insulin aspart  0-15 Units Subcutaneous TID WC   insulin aspart  0-5 Units Subcutaneous QHS   metoprolol tartrate  50 mg Oral BID   oxyCODONE  10 mg Oral Once   pravastatin  20 mg Oral Q2000   sodium chloride flush  10 mL Intravenous Q12H   triamcinolone cream  1 Application Topical TID   Infusions:   Assessment: Medications:  PTA Eliquis - last dose taken 11/23 PM at 1900    Assessment: Christopher Marsh is a 62 y.o. male on chronic anticoagulation for antiphospholipid syndrome with recurrent DVTs and atrial fibrillation. PMH is significant  for metastatic cholangiocarcinoma. Patient's oncologist recently switched him from warfarin to Eliquis to improve quality of life.  11/26: Pharmacy has been consulted to transition off heparin drip to Lovenox 1 mg/kg Q12h with anticipated discharge on Lovenox  Hgb 8.2   Plt 85   Scr 0.74  Goal of Therapy:  Monitor platelets by anticoagulation  protocol: Yes   Plan:  Lovenox 1 mg/kg Q12h.   Stop Heparin drip at the time of Lovenox administration. F/u CBC daily while on Therapeutic dosing of Lovenox  Bari Mantis PharmD Clinical Pharmacist 09/24/2023

## 2023-09-24 NOTE — TOC Benefit Eligibility Note (Signed)
Pharmacy Patient Advocate Encounter  Insurance verification completed.    The patient is insured through [facility]   Ran test claim for Lovenox. Currently a quantity of 60 is a 30 day supply and the co-pay is $0.00 .   This test claim was processed through [facility]- copay amounts may vary at other pharmacies due to pharmacy/plan contracts, or as the patient moves through the different stages of their insurance plan.

## 2023-09-24 NOTE — Progress Notes (Signed)
Inpatient Rehab Admissions Coordinator:   I did receive auth for CIR.  Note plans for transition to home with hospice.  We will step back for CIR at this time.   Estill Dooms, PT, DPT Admissions Coordinator [phone] 09/24/23  10:08 AM

## 2023-09-24 NOTE — Progress Notes (Signed)
EMS on unit to transport patient home with hospice.

## 2023-09-24 NOTE — Progress Notes (Signed)
[facility] Care Final Intake Summary  Name Christopher Marsh  Date of Birth 1961-04-30  Christopher Marsh a.k.a. "Christopher Marsh" is a 62 y/o white male with stage IV cholangiocarcinoma first dx'd in 07/2022, referred by Christopher Marsh at [facility]. Christopher Marsh is currently deciding between hospice care and continuing treatment. He is not interested in psychiatric medication recommendations.  Assessments: Christopher Marsh scored PHQ-9=4 (minimal), reporting fatigue, poor appetite, and psychomotor retardation. He additionally reported losing his train of thought more often than usual, noticed by others. Christopher Marsh was tearful when reporting his prognosis, "I don't know what to feel; I know my days are numbered." Christopher Marsh scored GAD-7=1 (minimal), reporting trouble relaxing on several days. Christopher Marsh denies anxiety or fear around death & dying. He does endorse overwhelm around medications, all the medical appts, and navigating the unknown.  Psych Tx/Hx: Christopher Marsh is currently prescribed Trazodone 50mg  nightly for sleep.  Psychosocial Hx: Christopher Marsh lives at home with his spouse Christopher Marsh. He has 2 sons in their 46's who live nearby, as does his mother. Christopher Marsh has worked in [employer] and is presently working, [employer] to retire end of Dec. Christopher Marsh is a big sports fan and had season tickets to the [employer] for 8 years throughout his sons' childhoods.  Assigned F43.20 adjustment disorder unspecified diagnosis.
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
## 2023-09-24 NOTE — Progress Notes (Signed)
[facility] Liaison Note  Received request from Allena Katz, LCSW , Transitions of Care Manager, for hospice services at home after discharge.  Spoke with patient and family to initiate education related to hospice philosophy, services, and team approach to care.  Patient/family verbalized understanding of information given.  Per discussion, the plan is for discharge home today.    DME needs discussed.  Patient has the following equipment in the home: walker.  Patient/family requests the following equipment in the home: hospital bed, over the bed table, BSC and Wheelchair.    The address has been verified and is correct in the chart. [name] Glab and phone number [phone]  is the family contact to arrange time of equipment delivery.    Please send signed and completed DNR home with the patient/family.  Please provide prescriptions at discharge as needed to ensure ongoing symptom management.    [facility] information and contact numbers given to family.   Above information shared with Allena Katz, LCSW, Transitions of Care Manager.     Please call with any Hospice related questions or concerns.  Thank you for the opportunity to participate in this patient's care.  Redge Gainer, [facility] Liaison [phone]

## 2023-09-24 NOTE — Discharge Summary (Signed)
Physician Discharge Summary   Patient: Christopher Marsh MRN: [id number] DOB: Jan 08, 1961  Admit date:     09/22/2023  Discharge date: 09/24/23  Discharge Physician: Alford Highland   PCP: Nelwyn Salisbury, MD   Recommendations at discharge:   Hospice at home  Discharge Diagnoses: Principal Problem:   Embolic stroke involving right middle cerebral artery [facility]) Active Problems:   Cholangiocarcinoma (HCC)   Typical atrial flutter (HCC)   Antiphospholipid syndrome (HCC)   Essential hypertension   Type 2 diabetes mellitus with hyperlipidemia (HCC)   Anemia of chronic disease   Thrombocytopenia (HCC)   Palliative care encounter   Acute ischemic stroke [facility])    Hospital Course: 62 year old man with past medical history of stage IV cholangiocarcinoma, antiphospholipid syndrome with hypercoagulable state and recently switched from Coumadin over to Eliquis, paroxysmal atrial fibrillation, hypertension, insulin-dependent diabetes mellitus presented with new onset left-sided weakness.  MRI of the brain shows right MCA scattered infarcts and left PCA scattered infarcts.  Patient did not do well initially with the bedside swallow evaluation.  Patient placed on heparin drip.  11/25.  Patient complaining of back pain.  Consulted palliative care and oncology.  Awaiting speech therapy to see if he does better today with the swallowing. 11/26.  Patient and family decided to go home with hospice.  Equipment set up.  Heparin drip switched over to Lovenox injections as per oncology.  Switched oxycodone over to Roxanol.  Patient already on Duragesic patch.    Assessment and Plan: * Embolic stroke involving right middle cerebral artery (HCC) Causing left-sided weakness.  Patient also has PCA left side embolic stroke.  PT and OT consultations appreciated.  Patient did not do well with the swallow exam initially.  Patient switched over to dysphagia 2 diet with nectar thick liquids.  Heparin drip switched  over to Lovenox injections as per oncology.  Lovenox injections prescribed ongoing home with hospice.  Since patient going home with hospice cholesterol medication discontinued.  Cholangiocarcinoma (HCC) Stage IV.  Metastases to spine.  Appreciate oncology and palliative care consultation.  Patient and patient's family decided to go home with hospice.  No further treatments since going home with hospice.  Typical atrial flutter (HCC) Paroxysmal in nature.  This morning had fast heart rate and gave a dose of IV metoprolol and oral metoprolol.  Continue metoprolol 50 mg twice daily.  Patient on Lovenox injections.  Antiphospholipid syndrome (HCC) Continue anticoagulation with Lovenox injections.  Essential hypertension Needed to go back on metoprolol secondary to fast heart rate this morning.  Thrombocytopenia (HCC) Last platelet count 85  Anemia of chronic disease Last hemoglobin 8.2  Type 2 diabetes mellitus with hyperlipidemia (HCC) Continue to hold oral medications at home         Consultants: Palliative care, oncology, neurology Procedures performed: None Disposition: Discharge to patient's home with hospice following Diet recommendation:  Dysphagia 2 diet with nectar thick liquids DISCHARGE MEDICATION: Allergies as of 09/24/2023   No Known Allergies      Medication List     STOP taking these medications    amoxicillin-clavulanate 875-125 MG tablet Commonly known as: AUGMENTIN   apixaban 5 MG Tabs tablet Commonly known as: Eliquis   Calcium Carbonate 500 MG Chew   glipiZIDE 10 MG tablet Commonly known as: GLUCOTROL   lidocaine-prilocaine cream Commonly known as: EMLA   LORazepam 0.5 MG tablet Commonly known as: ATIVAN   megestrol 40 MG tablet Commonly known as: MEGACE   metFORMIN 500 MG tablet  Commonly known as: GLUCOPHAGE   Mucinex D Max Strength [phone] MG Tb12 Generic drug: Pseudoephedrine-Guaifenesin   oxyCODONE 5 MG immediate release  tablet Commonly known as: Oxy IR/ROXICODONE   phytonadione 5 MG tablet Commonly known as: Mephyton   potassium chloride 10 MEQ tablet Commonly known as: KLOR-CON   potassium chloride SA 20 MEQ tablet Commonly known as: KLOR-CON M   pravastatin 20 MG tablet Commonly known as: PRAVACHOL   Slow-Mag 71.5-119 MG Tbec SR tablet Generic drug: magnesium chloride   tadalafil 20 MG tablet Commonly known as: CIALIS   torsemide 20 MG tablet Commonly known as: DEMADEX       TAKE these medications    albuterol 108 (90 Base) MCG/ACT inhaler Commonly known as: VENTOLIN HFA TAKE 2 PUFFS BY MOUTH EVERY 4 HOURS AS NEEDED   dexamethasone 4 MG tablet Commonly known as: DECADRON Take 2 tablets (8 mg total) by mouth daily. What changed: Another medication with the same name was removed. Continue taking this medication, and follow the directions you see here.   diphenoxylate-atropine 2.5-0.025 MG tablet Commonly known as: LOMOTIL Take 1 tablet by mouth 4 (four) times daily as needed for diarrhea or loose stools.   enoxaparin 100 MG/ML injection Commonly known as: LOVENOX Inject 1 mL (100 mg total) into the skin every 12 (twelve) hours.   feeding supplement Liqd Take 237 mLs by mouth 2 (two) times daily between meals.   fentaNYL 50 MCG/HR Commonly known as: DURAGESIC Place 1 patch onto the skin every 3 (three) days.   glucose blood test strip 1 each by Other route as needed for other. accu chek aviva plusUse as instructed   ketoconazole 2 % cream Commonly known as: NIZORAL Apply 1 Application topically 2 (two) times daily as needed for irritation.   Lactulose 20 GM/30ML Soln Take 30 mLs (20 g total) by mouth daily as needed (constipation.).   metoprolol tartrate 50 MG tablet Commonly known as: LOPRESSOR Take 1 tablet (50 mg total) by mouth 2 (two) times daily.   morphine CONCENTRATE 10 mg / 0.5 ml concentrated solution Take 0.25 mLs (5 mg total) by mouth every 2 (two) hours  as needed for severe pain (pain score 7-10), moderate pain (pain score 4-6) or shortness of breath.   ondansetron 4 MG tablet Commonly known as: ZOFRAN Take by mouth.   prochlorperazine 10 MG tablet Commonly known as: COMPAZINE Take 10 mg by mouth every 6 (six) hours as needed.   silver sulfADIAZINE 1 % cream Commonly known as: Silvadene Apply 1 Application topically daily.   traZODone 50 MG tablet Commonly known as: DESYREL TAKE 1 TABLET BY MOUTH AT BEDTIME AS NEEDED FOR SLEEP.   triamcinolone cream 0.1 % Commonly known as: KENALOG Apply 1 Application topically 3 (three) times daily.        Follow-up Information     Home with hospice Follow up in 1 day(s).                 Discharge Exam: Filed Weights   09/22/23 0912  Weight: 107 kg   Physical Exam HENT:     Head: Normocephalic.  Eyes:     General: Lids are normal.     Conjunctiva/sclera: Conjunctivae normal.  Cardiovascular:     Rate and Rhythm: Normal rate and regular rhythm.     Heart sounds: Normal heart sounds, S1 normal and S2 normal.  Pulmonary:     Breath sounds: Examination of the right-lower field reveals decreased breath sounds. Examination  of the left-lower field reveals decreased breath sounds. Decreased breath sounds present. No wheezing, rhonchi or rales.  Abdominal:     Palpations: Abdomen is soft.     Tenderness: There is no abdominal tenderness.  Musculoskeletal:     Right lower leg: Swelling present.     Left lower leg: Swelling present.  Skin:    General: Skin is warm.     Findings: No rash.  Neurological:     Mental Status: He is alert.     Comments: Answer questions appropriately.      Condition at discharge: fair  The results of significant diagnostics from this hospitalization (including imaging, microbiology, ancillary and laboratory) are listed below for reference.   Imaging Studies: ECHOCARDIOGRAM COMPLETE  Result Date: 09/23/2023    ECHOCARDIOGRAM REPORT    Patient Name:   Christopher Marsh Date of Exam: 09/23/2023 Medical Rec #:  [id number]        Height:       74.0 in Accession #:    [id number]       Weight:       236.0 lb Date of Birth:  04/16/1961         BSA:          2.332 m Patient Age:    62 years         BP:           151/80 mmHg Patient Gender: M                HR:           104 bpm. Exam Location:  [facility] Procedure: 2D Echo, Cardiac Doppler and Color Doppler Indications:     Stroke  History:         Patient has prior history of Echocardiogram examinations, most                  recent 02/13/2023. CHF, Stroke, Arrythmias:Atrial Fibrillation,                  Atrial Flutter and Tachycardia, Signs/Symptoms:Shortness of                  Breath and Edema; Risk Factors:Hypertension, Diabetes and                  Dyslipidemia.  Sonographer:     Mikki Harbor Referring Phys:  [id number] Emeline General Diagnosing Phys: Lorine Bears MD IMPRESSIONS  1. Left ventricular ejection fraction, by estimation, is 55 to 60%. The left ventricle has normal function. The left ventricle has no regional wall motion abnormalities. Left ventricular diastolic parameters are consistent with Grade I diastolic dysfunction (impaired relaxation).  2. Right ventricular systolic function is normal. The right ventricular size is normal. Tricuspid regurgitation signal is inadequate for assessing PA pressure.  3. Left atrial size was moderately dilated.  4. Right atrial size was mildly dilated.  5. The mitral valve is normal in structure. No evidence of mitral valve regurgitation. No evidence of mitral stenosis.  6. The aortic valve is normal in structure. Aortic valve regurgitation is not visualized. Aortic valve sclerosis/calcification is present, without any evidence of aortic stenosis.  7. The inferior vena cava is normal in size with greater than 50% respiratory variability, suggesting right atrial pressure of 3 mmHg. FINDINGS  Left Ventricle: Left ventricular ejection fraction, by estimation, is  55 to 60%. The left ventricle has normal function. The left ventricle has no regional wall motion abnormalities. The  left ventricular internal cavity size was normal in size. There is  no left ventricular hypertrophy. Left ventricular diastolic parameters are consistent with Grade I diastolic dysfunction (impaired relaxation). Right Ventricle: The right ventricular size is normal. No increase in right ventricular wall thickness. Right ventricular systolic function is normal. Tricuspid regurgitation signal is inadequate for assessing PA pressure. Left Atrium: Left atrial size was moderately dilated. Right Atrium: Right atrial size was mildly dilated. Pericardium: There is no evidence of pericardial effusion. Mitral Valve: The mitral valve is normal in structure. No evidence of mitral valve regurgitation. No evidence of mitral valve stenosis. MV peak gradient, 3.4 mmHg. The mean mitral valve gradient is 1.0 mmHg. Tricuspid Valve: The tricuspid valve is normal in structure. Tricuspid valve regurgitation is not demonstrated. No evidence of tricuspid stenosis. Aortic Valve: The aortic valve is normal in structure. Aortic valve regurgitation is not visualized. Aortic valve sclerosis/calcification is present, without any evidence of aortic stenosis. Aortic valve mean gradient measures 6.0 mmHg. Aortic valve peak  gradient measures 14.9 mmHg. Aortic valve area, by VTI measures 2.76 cm. Pulmonic Valve: The pulmonic valve was normal in structure. Pulmonic valve regurgitation is not visualized. No evidence of pulmonic stenosis. Aorta: The aortic root is normal in size and structure. Venous: The inferior vena cava is normal in size with greater than 50% respiratory variability, suggesting right atrial pressure of 3 mmHg. IAS/Shunts: No atrial level shunt detected by color flow Doppler.  LEFT VENTRICLE PLAX 2D LVIDd:         5.00 cm   Diastology LVIDs:         3.50 cm   LV e' medial:    9.68 cm/s LV PW:         1.10 cm   LV  E/e' medial:  7.1 LV IVS:        1.00 cm   LV e' lateral:   14.90 cm/s LVOT diam:     2.10 cm   LV E/e' lateral: 4.6 LV SV:         93 LV SV Index:   40 LVOT Area:     3.46 cm  RIGHT VENTRICLE RV Basal diam:  4.80 cm RV Mid diam:    3.90 cm RV S prime:     23.70 cm/s TAPSE (M-mode): 3.2 cm LEFT ATRIUM           Index        RIGHT ATRIUM           Index LA diam:      4.00 cm 1.72 cm/m   RA Area:     24.20 cm LA Vol (A2C): 76.9 ml 32.98 ml/m  RA Volume:   81.30 ml  34.86 ml/m LA Vol (A4C): 81.3 ml 34.86 ml/m  AORTIC VALVE                     PULMONIC VALVE AV Area (Vmax):    2.46 cm      PV Vmax:       1.38 m/s AV Area (Vmean):   3.01 cm      PV Peak grad:  7.6 mmHg AV Area (VTI):     2.76 cm AV Vmax:           193.00 cm/s AV Vmean:          110.000 cm/s AV VTI:            0.336 m AV Peak Grad:  14.9 mmHg AV Mean Grad:      6.0 mmHg LVOT Vmax:         137.00 cm/s LVOT Vmean:        95.700 cm/s LVOT VTI:          0.268 m LVOT/AV VTI ratio: 0.80  AORTA Ao Root diam: 3.90 cm MITRAL VALVE MV Area (PHT): 2.47 cm    SHUNTS MV Area VTI:   3.23 cm    Systemic VTI:  0.27 m MV Peak grad:  3.4 mmHg    Systemic Diam: 2.10 cm MV Mean grad:  1.0 mmHg MV Vmax:       0.93 m/s MV Vmean:      52.7 cm/s MV Decel Time: 307 msec MV E velocity: 69.20 cm/s MV A velocity: 86.50 cm/s MV E/A ratio:  0.80 Lorine Bears MD Electronically signed by Lorine Bears MD Signature Date/Time: 09/23/2023/5:11:28 PM    Final    MR BRAIN WO CONTRAST  Result Date: 09/22/2023 CLINICAL DATA:  62 year old male with metastatic cholangiocarcinoma, code stroke presentation with left side deficit and abnormal appearance of right MCA branches on CTA. EXAM: MRI HEAD WITHOUT CONTRAST TECHNIQUE: Multiplanar, multiecho pulse sequences of the brain and surrounding structures were obtained without intravenous contrast. COMPARISON:  CT head, CTA head and neck earlier today. FINDINGS: Brain: On DWI there is faint abnormal diffusion signal in the right  corona radiata, patchy involvement of the right postcentral gyrus (series 5, images 77-83). Faint involvement of the posterior limb external capsule also. But also punctate areas of contralateral anterior left frontal lobe subcortical white matter restricted diffusion also (series 5, image 80). Elsewhere diffusion appears to remain normal. Minimal T2 and FLAIR hyperintensity in the affected areas. No evidence of hemorrhage or mass effect. Superimposed chronic lacunar infarct of the right caudate corresponding to the plain CT finding today (series 6, image 24). No superimposed midline shift, mass effect, evidence of mass lesion, ventriculomegaly, extra-axial collection or acute intracranial hemorrhage. Cervicomedullary junction and pituitary are within normal limits. Background gray and white matter signal is largely normal for age, however, there is subtle evidence of cortical encephalomalacia in the left occipital lobe series 9, image 24. Chronic microhemorrhage also noted in the pontomedullary junction series 10, image 14. Vascular: Major intracranial vascular flow voids are preserved. Skull and upper cervical spine: Visible bone marrow signal and cervical spine appear negative. Sinuses/Orbits: Negative orbits.  Right maxillary sinus disease. Other: Mastoids are clear. Grossly normal visible internal auditory structures. Negative visible scalp and face. IMPRESSION: 1. Positive for scattered small acute infarcts in the Right MCA territory, from the posterior right external capsule, through the corona radiata and to the postcentral gyrus. Additional superimposed small contralateral left anterior frontal lobe white matter lacunar infarct. No associated hemorrhage or mass effect. 2. Subtle chronic left PCA territory infarct at the occipital pole. Otherwise mild for age underlying chronic small vessel disease including at the right caudate, brainstem. Electronically Signed   By: Odessa Fleming M.D.   On: 09/22/2023 13:09    CT ANGIO HEAD NECK W WO CM (CODE STROKE)  Result Date: 09/22/2023 CLINICAL DATA:  Code stroke. 62 year old male left side deficit. Metastatic cholangiocarcinoma. EXAM: CT ANGIOGRAPHY HEAD AND NECK TECHNIQUE: Multidetector CT imaging of the head and neck was performed using the standard protocol during bolus administration of intravenous contrast. Multiplanar CT image reconstructions and MIPs were obtained to evaluate the vascular anatomy. Carotid stenosis measurements (when applicable) are obtained utilizing NASCET criteria, using the distal  internal carotid diameter as the denominator. RADIATION DOSE REDUCTION: This exam was performed according to the departmental dose-optimization program which includes automated exposure control, adjustment of the mA and/or kV according to patient size and/or use of iterative reconstruction technique. CONTRAST:  75mL OMNIPAQUE IOHEXOL 350 MG/ML SOLN COMPARISON:  Head CT today.  Thoracic MRI 07/18/2023. FINDINGS: CTA NECK Skeleton: Destructive thoracic vertebral metastasis at T4 redemonstrated. Epidural and extraosseous tumor there asymmetric to the right (series 5, image 190). T4 vertebral height appears stable since the September MRI. Upper chest: Right chest Port-A-Cath. Mediastinal lipomatosis. No superior mediastinal lymphadenopathy. No upper lung metastatic disease. Other neck: Heterogeneously enlarged left thyroid This has been evaluated on previous imaging. (ref: J Am Coll Radiol. 2015 Feb;12(2): 143-50).No other neck mass or lymphadenopathy. Aortic arch: 3 vessel arch.  Calcified aortic atherosclerosis. Right carotid system: Brachiocephalic artery and right CCA origin without significant plaque or stenosis. Mild calcified plaque at the right ICA origin and bulb. Right ICA is patent to the skull base without stenosis. Left carotid system: Similar mild left ICA origin atherosclerosis without stenosis. Vertebral arteries: Minimal proximal right subclavian plaque  without stenosis. Normal right vertebral artery origin. Right vertebral artery is patent in the neck and to the skull base with no significant stenosis. Minimal proximal left subclavian artery plaque. Calcified atherosclerosis at the left vertebral artery origin with only mild stenosis on series 7, image 321. Codominant left vertebral artery is patent to the skull base with no additional plaque or stenosis. CTA HEAD Posterior circulation: Heavily calcified bilateral vertebral artery V4 segments on series 7, image 155 with mild to moderate bilateral stenosis. Distal vertebrals and vertebrobasilar junction remain patent. Patent basilar artery without stenosis. Patent although diminutive SCA and PCA origins. Posterior communicating arteries are diminutive or absent. Bilateral PCA branches appear diminutive (series 8, image 21) but seem to remain patent. Anterior circulation: Both ICA siphons are patent. Cavernous and supraclinoid ICAs are heavily calcified with mild to moderate stenosis bilaterally. Patent carotid termini. Patent although diminutive MCA and ACA origins, 1st order segments (series 10, image 19). Diminutive or absent anterior communicating artery. ACA branches appear diminutive but patent. Left MCA M1 segment and bifurcation are patent without stenosis. Left MCA branches appear diminutive but patent. Contralateral right MCA M1 is patent, diminutive. Right MCA bifurcation is patent. But there are absent right MCA M2 branches, especially in the middle division is seen on series 12, image 22. Due to diminutive vessel caliber, discrete occlusion of these is difficult to identified. Venous sinuses: Faint contrast bolus, grossly patent. Anatomic variants: None significant. Review of the MIP images confirms the above findings IMPRESSION: 1. Generalized diminutive appearance of all circle-of-Willis branches, etiology unclear. But asymmetric absence of right MCA M2 branches compatible with ELVO in the setting of  left side deficit. These results were communicated to Dr. Selina Cooley at 0910 hours on 09/22/2023 by text page via the [facility] messaging system. 2. Metastatic cholangiocarcinoma. Large T4 thoracic lytic metastasis with epidural and extraosseous tumor extension asymmetric to the right. 3. Heavily calcified bilateral ICA siphons and vertebral artery V4 segments with generalized mild to moderate stenosis. 4. But mild for age extracranial atherosclerosis. No significant atherosclerotic stenosis in the neck. Electronically Signed   By: Odessa Fleming M.D.   On: 09/22/2023 09:18   CT HEAD CODE STROKE WO CONTRAST  Result Date: 09/22/2023 CLINICAL DATA:  Code stroke. 62 year old male left side deficit. Cholangiocarcinoma. EXAM: CT HEAD WITHOUT CONTRAST TECHNIQUE: Contiguous axial images were obtained from the base  of the skull through the vertex without intravenous contrast. RADIATION DOSE REDUCTION: This exam was performed according to the departmental dose-optimization program which includes automated exposure control, adjustment of the mA and/or kV according to patient size and/or use of iterative reconstruction technique. COMPARISON:  Face CT 05/14/2014.  PET-CT 12/10/2022. FINDINGS: Brain: Stable cerebral volume since February. No midline shift, ventriculomegaly, mass effect, evidence of mass lesion, or acute intracranial hemorrhage. Extensive dural calcifications. Confluent but indistinct hypodensity at the anterior right basal ganglia, right caudate nucleus and adjacent frontal horn white matter series 3, image 18. But this may have been present in February. Elsewhere gray-white differentiation within normal limits. No cortically based acute infarct identified. Vascular: Advanced calcified atherosclerosis at the skull base. No suspicious intracranial vascular hyperdensity. Skull: No acute osseous abnormality identified. Sinuses/Orbits: Improved right [facility] pattern of obstructive sinus disease since February. Residual  mucoperiosteal thickening in the right maxillary sinus. Tympanic cavities and mastoids are clear. Other: No gaze deviation.  Calcified scalp vessel atherosclerosis. ASPECTS [facility] Stroke Program Early CT Score) Total score (0-10 with 10 being normal): 10 IMPRESSION: 1. Age indeterminate anterior right basal ganglia lacunar infarct, but might have been present on February PET-CT. 2. No acute cortically based infarct or acute intracranial hemorrhage identified. ASPECTS 10. 3. Advanced calcified atherosclerosis. Salient findings were communicated to Dr. Selina Cooley at 8:50 am on 09/22/2023 by text page via the [facility] messaging system. Electronically Signed   By: Odessa Fleming M.D.   On: 09/22/2023 08:50   CT CHEST ABDOMEN PELVIS W CONTRAST  Result Date: 09/16/2023 CLINICAL DATA:  Cholangiocarcinoma restaging * Tracking Code: BO * EXAM: CT CHEST, ABDOMEN, AND PELVIS WITH CONTRAST TECHNIQUE: Multidetector CT imaging of the chest, abdomen and pelvis was performed following the standard protocol during bolus administration of intravenous contrast. RADIATION DOSE REDUCTION: This exam was performed according to the departmental dose-optimization program which includes automated exposure control, adjustment of the mA and/or kV according to patient size and/or use of iterative reconstruction technique. CONTRAST:  80mL ISOVUE-300 IOPAMIDOL (ISOVUE-300) INJECTION 61% COMPARISON:  07/16/2023 FINDINGS: CT CHEST FINDINGS Cardiovascular: Stable appearance the right Port-A-Cath with tip in the SVC. Prominent pulmonary artery favoring pulmonary arterial hypertension. Coronary and aortic atheromatous vascular calcifications. Mediastinum/Nodes: Scattered mediastinal lymph nodes are not overtly pathologically enlarged. Stable left thyroid nodule up to about 3.8 cm in long axis on image 9 series 2. In the setting of significant comorbidities or limited life expectancy, no follow-up recommended (ref: J Am Coll Radiol. 2015 Feb;12(2): 143-50).  Lungs/Pleura: Mild scattered scarring in the lungs. Musculoskeletal: There is lytic lesions include the left scapula, the right ninth rib posteriorly, and especially the T4 vertebral body where there is a large lytic lesion encompassing most of the vertebral body especially eccentric to the right, with paraspinal component and increased epidural component which could be impinging on the thoracic cord. The T9 lytic lesion measures about 3.4 cm anterior-posterior, previously 2.8 cm. Left T12 posterior element involvement extending in the pedicle, adjacent vertebral body, lamina, and spinous process. There is also a separate metastatic lesion in the right T12 pedicle. CT ABDOMEN PELVIS FINDINGS Hepatobiliary: Nodular liver contour. The heterogeneous but primarily hypodense right hepatic lobe mass measures 7.4 by 5.8 cm on image 67 series 2, stable. Minor areas of satellite nodularity/hypodensity around this lesion are similar to prior. There is some mild hypodensity in segment 4a and a small hypodense lesion in segment 2 both which appear stable. Gallbladder unremarkable. Pancreas: Unremarkable Spleen: Measures 13.2 by 12.1 by  9.1 cm (volume = 760 cm^3), compatible with splenomegaly. Adrenals/Urinary Tract: Adrenal glands unremarkable. Stable benign Bosniak category 2 cyst of the right kidney upper pole with capsular calcifications, as worked up on prior exams. Right kidney lower pole benign cyst and small left renal cysts. No worrisome renal lesion. Urinary bladder partially obscured by streak artifact from the left hip implant. Stomach/Bowel: Unremarkable Vascular/Lymphatic: Atherosclerosis is present, including aortoiliac atherosclerotic disease. Right gastric node 1.1 cm in short axis on image 66 series 2, formerly 1.3 cm. Aortocaval node 1.0 cm in short axis on image 83 series 2, formerly 1.1 cm. Other similar borderline enlarged lymph nodes are present. Patent portal vein and patent splenic vein. Reproductive:  Obscured by streak artifact from the left hip prosthesis. Grossly unremarkable. Other: Increased ascites, currently moderate. Increased mesenteric and omental edema without overt tumor nodularity. Musculoskeletal: Stable right L4 lytic lesion. Stable mixed density lesion of the right iliac bone. Stable lytic lesions involving the left acetabulum and left ischium. Right acetabular mass measures 5.2 cm in long axis, formerly 4.1 cm. Lytic metastatic lesion in the right pubic bone measures 2.6 cm in diameter, previously 1.9 cm in long axis. Mixed density lesion of the right ischial tuberosity appears stable. Chronic avascular necrosis of the right femoral head. Left total hip prosthesis. IMPRESSION: 1. Increased size of lytic metastatic lesions in the T9 vertebral body, right acetabulum, and right pubic bone. The T9 lytic lesion has a large epidural component which could be impinging on the thoracic cord. Consider thoracic spine MRI without and with contrast to assess for cord impingement. 2. Stable size of the right hepatic lobe mass. 3. Increased ascites, currently moderate. Increased mesenteric and omental edema without overt tumor nodularity along peritoneal surfaces. 4. Stable borderline enlarged upper abdominal and retroperitoneal lymph nodes. 5. Chronic avascular necrosis of the right femoral head. 6. Prominent pulmonary artery favoring pulmonary arterial hypertension. 7. Aortic and coronary atherosclerosis. Aortic Atherosclerosis (ICD10-I70.0). Electronically Signed   By: Gaylyn Rong M.D.   On: 09/16/2023 17:09    Microbiology: Results for orders placed or performed during the hospital encounter of 09/15/22  Blood culture (routine x 2)     Status: None   Collection Time: 09/15/22  3:06 PM   Specimen: BLOOD  Result Value Ref Range Status   Specimen Description   Final    BLOOD RIGHT ANTECUBITAL Performed at [facility], [address], [place], [place] [address]    Special  Requests   Final    BOTTLES DRAWN AEROBIC AND ANAEROBIC Blood Culture adequate volume Performed at [facility], [address], [place], [place] [address]    Culture   Final    NO GROWTH 5 DAYS Performed at [facility], [address] [place], [place] [address]    Report Status 09/20/2022 FINAL  Final  Blood culture (routine x 2)     Status: None   Collection Time: 09/15/22  3:24 PM   Specimen: BLOOD  Result Value Ref Range Status   Specimen Description   Final    BLOOD LEFT ANTECUBITAL Performed at [facility], [address], [place], [place] [address]    Special Requests   Final    BOTTLES DRAWN AEROBIC AND ANAEROBIC Blood Culture results may not be optimal due to an excessive volume of blood received in culture bottles Performed at [facility], [address] [place], [place] [address]    Culture   Final  NO GROWTH 5 DAYS Performed at [facility], [address], [place], [place] [address]    Report Status 09/20/2022 FINAL  Final  SARS Coronavirus 2 by RT PCR (hospital order, performed in [facility] hospital lab) *cepheid single result test* Anterior Nasal Swab     Status: None   Collection Time: 09/15/22  4:33 PM   Specimen: Anterior Nasal Swab  Result Value Ref Range Status   SARS Coronavirus 2 by RT PCR NEGATIVE NEGATIVE Final    Comment: (NOTE) SARS-CoV-2 target nucleic acids are NOT DETECTED.  The SARS-CoV-2 RNA is generally detectable in upper and lower respiratory specimens during the acute phase of infection. The lowest concentration of SARS-CoV-2 viral copies this assay can detect is 250 copies / mL. A negative result does not preclude SARS-CoV-2 infection and should not be used as the sole basis for treatment or other patient management decisions.  A negative result may occur with improper specimen collection / handling, submission of specimen other than nasopharyngeal swab, presence  of viral mutation(s) within the areas targeted by this assay, and inadequate number of viral copies (<250 copies / mL). A negative result must be combined with clinical observations, patient history, and epidemiological information.  Fact Sheet for Patients:   RoadLapTop.co.za  Fact Sheet for Healthcare Providers: http://kim-miller.com/  This test is not yet approved or  cleared by the [place] FDA and has been authorized for detection and/or diagnosis of SARS-CoV-2 by FDA under an Emergency Use Authorization (EUA).  This EUA will remain in effect (meaning this test can be used) for the duration of the COVID-19 declaration under Section 564(b)(1) of the Act, 21 U.S.C. section 360bbb-3(b)(1), unless the authorization is terminated or revoked sooner.  Performed at [facility], [address] [place], [place] [address]     Labs: CBC: Recent Labs  Lab 09/22/23 0850 09/23/23 0542 09/24/23 0555  WBC 9.3 6.1 4.7  NEUTROABS 8.4*  --   --   HGB 9.7* 8.0* 8.2*  HCT 29.7* 24.0* 25.3*  MCV 94.3 92.3 93.7  PLT 149* 114* 85*   Basic Metabolic Panel: Recent Labs  Lab 09/22/23 0850 09/24/23 0555  NA 135 138  K 3.7 3.3*  CL 102 109  CO2 21* 21*  GLUCOSE 158* 163*  BUN 12 14  CREATININE 0.88 0.74  CALCIUM 9.4 8.5*   Liver Function Tests: Recent Labs  Lab 09/22/23 0850  AST 24  ALT 13  ALKPHOS 118  BILITOT 0.8  PROT 6.7  ALBUMIN 2.7*   CBG: Recent Labs  Lab 09/23/23 1157 09/23/23 1753 09/23/23 1956 09/24/23 0751 09/24/23 1219  GLUCAP 143* 157* 128* 141* 131*    Discharge time spent: greater than 30 minutes.  Signed: Alford Highland, MD [facility] 09/24/2023

## 2023-09-24 NOTE — Progress Notes (Signed)
Patient's NIH yesterday was 9. Tonight he refused an NIH for me initially. He refused the NIH all day during dayshift per the dayshift RN. At 2000, The neurologist assessed him at bedside With RN present. NIH 15 with his assessment of the patient. RN told the neurologist the patient has been refusing NIH today and their is talk by the family of pursuing hospice care tomorrow. He said it is the patient's right to refuse to participate.   RN notified Larkin Ina, NP of the above information.

## 2023-09-24 NOTE — Progress Notes (Signed)
Patient refused to participate in NIH at 0000. RN educated the patient on the importance of the NIH assessment

## 2023-09-24 NOTE — TOC Transition Note (Signed)
Transition of Care [facility]) - CM/SW Discharge Note   Patient Details  Name: Christopher Marsh MRN: [id number] Date of Birth: 09-Nov-1960  [facility]) CM/SW Contact:  Allena Katz, LCSW Phone Number: 09/24/2023, 1:22 PM   Clinical Narrative:   Pt has orders to discharge home with [facility]. Medical necessity on the unit. DNR signed and with patient.    Final next level of care: Home w Hospice Care Barriers to Discharge: Barriers Resolved   Patient Goals and CMS Choice CMS Medicare.gov Compare Post Acute Care list provided to:: Patient    Discharge Placement                  Patient to be transferred to facility by: [facility] Name of family member notified: [name] Patient and family notified of of transfer: 09/24/23  Discharge Plan and Services Additional resources added to the After Visit Summary for                                       Social Determinants of Health (SDOH) Interventions SDOH Screenings   Food Insecurity: No Food Insecurity (09/22/2023)  Housing: Low Risk  (09/22/2023)  Transportation Needs: No Transportation Needs (09/22/2023)  Utilities: Not At Risk (09/22/2023)  Alcohol Screen: Low Risk  (08/27/2022)  Depression (PHQ2-9): Low Risk  (08/27/2022)  Financial Resource Strain: Low Risk  (08/27/2022)  Physical Activity: Insufficiently Active (08/27/2022)  Social Connections: Socially Integrated (08/27/2022)  Stress: Stress Concern Present (08/27/2022)  Tobacco Use: Medium Risk (09/22/2023)     Readmission Risk Interventions    02/15/2023   11:06 AM 09/17/2022    8:56 AM 09/17/2022    8:51 AM  Readmission Risk Prevention Plan  Transportation Screening Complete Complete Complete  PCP or Specialist Appt within 5-7 Days  Complete Complete  PCP or Specialist Appt within 3-5 Days Complete    Home Care Screening  Complete Complete  Medication Review (RN CM)  Complete Complete  HRI or Home Care Consult Complete     Social Work Consult for Recovery Care Planning/Counseling Complete    Palliative Care Screening Not Applicable    Medication Review Oceanographer) Complete

## 2023-09-24 NOTE — Progress Notes (Signed)
Speech Language Pathology Treatment: Dysphagia  Patient Details Name: Christopher Marsh MRN: [id number] DOB: Jun 18, 1961 Today's Date: 09/24/2023 Time: 0850-0940 SLP Time Calculation (min) (ACUTE ONLY): 50 min  Assessment / Plan / Recommendation Clinical Impression  Pt seen for ongoing assessment of swallowing today; trials to upgrade diet today. Pt drowsy s/p pain medication. He would awaken and engage w/ others/Family w/ Mod cues. Noted ongoing L OM weakness; mild Dysarthria -- speech intelligibility 100%. Decreased awareness during tasks requiring verbal/tactile cues for follow through w/ precautions during oral intake.  On RA, afebrile. WBC WNL.  Pt appears to present w/ overall oropharyngeal phase dysphagia in setting of acute R MCA, Left OM weakness, and now impact from sedating pain medications. Pt has presented w/ min decreased awareness/judgement during po tasks also. This overall presentation increases risk for aspiration/aspiration pneumonia.  Pt's risk for aspiration can be reduced when following aspiration precautions and using a modified Dysphagia diet w/ Nectar consistency liquids. He requires Full Supervision during oral intake w/ tactile/verbal cues and feeding support as needed.    Pt consumed trial of minced, softened solid, then tsp of Nectar liquids w/ No immediate, overt clinical s/s of aspiration noted; no decline in vocal quality, no cough, and no decline in respiratory status during/post trials. Oral phase was impacted by the apparent drowsiness from the pain medication but he was able to follow instruction for lingual sweeping and swallowing to clear mouth. Alternated w/ a sip of liquid to aid clearing. Thin liquids and further solids were not assessed d/t drowsy State.     Thorough Education given on: dysphagia; aspiration precautions; aspiration/aspiration pneumonia risk; food/liquid consistencies rec'd currently; food and liquid prep using the thickener. Supervision and  cues at meals to monitor follow through w/ feeding, oral clearing, and swallowing. Pills CRUSHED in Puree. Handout given on general precautions discussed and the above information discussed including ordering information of the thickened liquids. Questions answered.  Pt preparing for d/c home today w/ Hospice.   Recommend trial of a Dysphagia level 2(minced foods, puree) w/ Nectar liquids by tsp/cup; aspiration precautions; reduce Distractions/Talking during meals. 100% Supervision, support w/ feeding at meals. Pills Whole vs Crushed in Puree for safer swallowing. NSG/MD updated. ST services will continue to follow for ongoing assessment of swallowing and toleration of diet; trials to upgrade diet consistency as appropriate/safe for pt. Palliative Care and Dietician f/u rec'd.     HPI HPI: Pt admitted to [facility] on 09/22/23 for c/o stroke like symptoms including: L sided weakness resulting in mechanical fall. On Eliquis at baseline and determined not to be candidate for TNK. Significant PMH includes: antiphospholipid antibody syndrome, paroxysmal atrial fibrillation on Eliquis, diabetes, hypertension, hyperlipidemia, cholangiocarcinoma. Head CT, "1. Age indeterminate anterior right basal ganglia lacunar infarct,  but might have been present on February PET-CT.  2. No acute cortically based infarct or acute intracranial  hemorrhage identified. ASPECTS 10.  3. Advanced calcified atherosclerosis.".  OF NOTE: Pt is discharging home w/ Hospice services to follow.      SLP Plan  Continue with current plan of care      Recommendations for follow up therapy are one component of a multi-disciplinary discharge planning process, led by the attending physician.  Recommendations may be updated based on patient status, additional functional criteria and insurance authorization.    Recommendations  Diet recommendations: Dysphagia 2 (fine chop);Nectar-thick liquid Liquids provided via: Teaspoon;Cup;No  straw Medication Administration: Crushed with puree (vs Whole in puree if able) Supervision: Patient able to  self feed;Staff to assist with self feeding;Full supervision/cueing for compensatory strategies Compensations: Minimize environmental distractions;Slow rate;Small sips/bites;Lingual sweep for clearance of pocketing;Follow solids with liquid Postural Changes and/or Swallow Maneuvers: Out of bed for meals;Seated upright 90 degrees;Upright 30-60 min after meal                 (Hospice and Palliative Care support; Dietician) Oral care BID;Oral care before and after PO;Staff/trained caregiver to provide oral care;Patient independent with oral care   Frequent or constant Supervision/Assistance (min impulsive; decreased awareness) Dysphagia, oropharyngeal phase (R13.12) (R MCA; chronic illness/Ca)     Continue with current plan of care       Jerilynn Som, MS, CCC-SLP Speech Language Pathologist Rehab Services; [facility] [phone] (ascom) Avyaan Summer  09/24/2023, 4:52 PM

## 2023-09-25 ENCOUNTER — Telehealth: Payer: Self-pay

## 2023-09-25 NOTE — Progress Notes (Signed)
Hi Christopher Marsh,   Dr. Alois Cliche had a medication recommendation to be considered as Christopher Marsh transitions to hospice, which may support sleep/appetite and lessen pill burden.  Let me know if any questions, Dorene Grebe  --  Dear Referring Specialist:  Thank you for referring your patient to [facility]. Below you will find my new psychiatric medication recommendations.  The patient may not wish to make this change, but I noticed that he is taking both trazodone and megace. It might be worthwhile to consider consolidating into one medication such as mirtazapine, which can help with sleep and appetite, in order to lower his overall pill burden.   Once reviewed, please confirm prescription(s) within 2 business days. If you modify or decline the recommendations, please document these modifications and notify us. If you have any questions or concerns, please call our [facility] provider line at [phone].  Best,   Demetria Pore, MD   Name: Christopher, Marsh Date of Birth: December 08, 1960 Referring Provider: Laurette Schimke, NP Patient MRN: [id number] Most recent Assessments: Depression (PHQ-9) 09/19/2023 4 Anxiety (GAD-7) 09/19/2023 1   Medication Recommendations: Psychiatric Provider recommends that the Referring Specialist prescribe the below medications....  1. Medication (name, dose, instructions) start mirtazapine 7.5mg  nightly  2. Medication (name, dose, instructions) Stop trazodone  3. Medication (name, dose, instructions) Stop megace

## 2023-09-25 NOTE — Transitions of Care (Post Inpatient/ED Visit) (Signed)
09/25/2023  Name: Christopher Marsh MRN: [id number] DOB: 1961-10-17  Today's TOC FU Call Status: Today's TOC FU Call Status:: Successful TOC FU Call Completed TOC FU Call Complete Date: 09/25/23 Patient's Name and Date of Birth confirmed.  Transition Care Management Follow-up Telephone Call Date of Discharge: 09/24/23 Discharge Facility: [facility]) Type of Discharge: Inpatient Admission Primary Inpatient Discharge Diagnosis:: "weakness" How have you been since you were released from the hospital?:  (Wife voices that they had " a rough night" as pt had "an accident" and she had trouble getting him cleaned up. Hospice RN coming to see them this morning and will provide supplies & teach family on how to care for him.) Any questions or concerns?: No  Items Reviewed: Did you receive and understand the discharge instructions provided?: Yes Medications obtained,verified, and reconciled?: No Medications Not Reviewed Reasons:: Other: (unable to  complete med review-call got disconnected- attempted to reach wife back and unsuccessful-hospice nurse to review meds during home visit today) Any new allergies since your discharge?: No Dietary orders reviewed?: Yes Type of Diet Ordered:: regular as tolerated Do you have support at home?: Yes People in Home: spouse Name of Support/Comfort Primary Source: Christopher Marsh-wife- she voices that her daughter and son in law are also there to assist as needed  Medications Reviewed Today: Medications Reviewed Today     Reviewed by Charlyn Minerva, RN (Registered Nurse) on 09/25/23 at 1004  Med List Status: <None>   Medication Order Taking? Sig Documenting Provider Last Dose Status Informant  albuterol (VENTOLIN HFA) 108 (90 Base) MCG/ACT inhaler [id number] No TAKE 2 PUFFS BY MOUTH EVERY 4 HOURS AS NEEDED Christopher Salisbury, MD prn Active Self, Pharmacy Records, Multiple Informants  dexamethasone (DECADRON) 4 MG tablet [id number]   Take 2 tablets (8 mg total) by mouth daily. Christopher Patience, MD  Active Multiple Informants  diphenoxylate-atropine (LOMOTIL) 2.5-0.025 MG tablet [id number] No Take 1 tablet by mouth 4 (four) times daily as needed for diarrhea or loose stools. Christopher Patience, MD prn Active Multiple Informants  enoxaparin (LOVENOX) 100 MG/ML injection [id number]  Inject 1 mL (100 mg total) into the skin every 12 (twelve) hours. Christopher Highland, MD  Active   feeding supplement (ENSURE ENLIVE / ENSURE PLUS) LIQD [id number]  Take 237 mLs by mouth 2 (two) times daily between meals. Christopher Highland, MD  Active   fentaNYL (DURAGESIC) 50 MCG/HR [id number] No Place 1 patch onto the skin every 3 (three) days. Christopher Patience, MD Taking Active Multiple Informants  glucose blood test strip [id number] No 1 each by Other route as needed for other. accu chek aviva plusUse as instructed [provider] Taking Active Self, Pharmacy Records, Multiple Informants  ketoconazole (NIZORAL) 2 % cream [id number] No Apply 1 Application topically 2 (two) times daily as needed for irritation. Christopher Salisbury, MD 09/21/2023 Active Multiple Informants  Lactulose 20 GM/30ML SOLN [id number] No Take 30 mLs (20 g total) by mouth daily as needed (constipation.). Christopher Patience, MD prn Active Multiple Informants  metoprolol tartrate (LOPRESSOR) 50 MG tablet [id number]  Take 1 tablet (50 mg total) by mouth 2 (two) times daily. Christopher Highland, MD  Active   Morphine Sulfate (MORPHINE CONCENTRATE) 10 mg / 0.5 ml concentrated solution [id number]  Take 0.25 mLs (5 mg total) by mouth every 2 (two) hours as needed for severe pain (pain score 7-10), moderate pain (pain score 4-6) or shortness of breath. Christopher Highland, MD  Active   ondansetron (ZOFRAN) 4 MG tablet  [id number] No Take by mouth. [provider] prn Active Multiple Informants  prochlorperazine (COMPAZINE) 10 MG tablet [id number] No Take 10 mg by mouth every 6 (six) hours as needed. [provider] prn  Active Multiple Informants  silver sulfADIAZINE (SILVADENE) 1 % cream [id number] No Apply 1 Application topically daily. Christopher Patience, MD Taking Active Multiple Informants  traZODone (DESYREL) 50 MG tablet [id number] No TAKE 1 TABLET BY MOUTH AT BEDTIME AS NEEDED FOR SLEEP. Christopher Patience, MD 09/21/2023 Active Multiple Informants  triamcinolone cream (KENALOG) 0.1 % [id number] No Apply 1 Application topically 3 (three) times daily. Christopher Salisbury, MD Past Week Active Multiple Informants            Home Care and Equipment/Supplies: Were Home Health Services Ordered?: Yes Name of Home Health Agency:: [facility] Has Agency set up a time to come to your home?: Yes First Home Health Visit Date: 09/25/23 (wife confirms nurse has called and will be out today between 9-11am) Any new equipment or medical supplies ordered?: No  Functional Questionnaire: Do you need assistance with bathing/showering or dressing?: Yes (wife/family assisting with all ADLs/IADLs) Do you need assistance with meal preparation?: Yes Do you need assistance with eating?: No Do you have difficulty maintaining continence: Yes Do you need assistance with getting out of bed/getting out of a chair/moving?: Yes Do you have difficulty managing or taking your medications?: Yes  Follow up appointments reviewed: PCP Follow-up appointment confirmed?: NA (pt has elected hospice services-will be followed by hospice physician) Specialist Hospital Follow-up appointment confirmed?: NA Do you need transportation to your follow-up appointment?: No Do you understand care options if your condition(s) worsen?: Yes-patient verbalized understanding (wife aware to contact hospice -anytime 24/7 for any pt needs or concerns)  SDOH Interventions Today    Flowsheet Row Most Recent Value  SDOH Interventions   Food Insecurity Interventions Intervention Not Indicated  Housing Interventions Intervention Not Indicated  Transportation Interventions  Intervention Not Indicated  Utilities Interventions Intervention Not Indicated      TOC interventions discussed/reviewed: -Discussed/reviewed hospice- insurance/health plans benefits -Provided Verbal Education: hospice care & philosophy, symptom mgmt., fall/safety measures in the home     Antionette Fairy, RN,BSN,CCM RN Care Manager Transitions of Care  [place]  Direct Phone: [phone] Toll Free: [phone] Fax: [phone]

## 2023-10-01 ENCOUNTER — Other Ambulatory Visit: Payer: Self-pay | Admitting: Hospice and Palliative Medicine

## 2023-10-01 MED ORDER — MIRTAZAPINE 7.5 MG PO TABS: 7.5000 mg | ORAL_TABLET | Freq: Every day | ORAL | 3 refills | Status: DC

## 2023-10-01 NOTE — Progress Notes (Signed)
DC trazodone and start mirtazapine 7.5 mg nightly per recommendations of psychiatry [facility]).

## 2023-10-02 ENCOUNTER — Other Ambulatory Visit: Payer: BC Managed Care – PPO

## 2023-10-02 ENCOUNTER — Ambulatory Visit: Payer: BC Managed Care – PPO | Admitting: Oncology

## 2023-10-02 ENCOUNTER — Ambulatory Visit: Payer: BC Managed Care – PPO

## 2023-10-02 ENCOUNTER — Encounter: Payer: BC Managed Care – PPO | Admitting: Hospice and Palliative Medicine

## 2023-10-03 ENCOUNTER — Other Ambulatory Visit: Payer: Self-pay | Admitting: Oncology

## 2023-10-04 ENCOUNTER — Other Ambulatory Visit: Payer: Self-pay | Admitting: Family Medicine

## 2023-10-07 ENCOUNTER — Other Ambulatory Visit: Payer: BC Managed Care – PPO

## 2023-10-07 ENCOUNTER — Ambulatory Visit: Payer: BC Managed Care – PPO

## 2023-10-07 ENCOUNTER — Ambulatory Visit: Payer: Self-pay

## 2023-10-07 ENCOUNTER — Ambulatory Visit: Payer: BC Managed Care – PPO | Admitting: Oncology

## 2023-10-07 NOTE — Progress Notes (Signed)
Anticoagulation therapy has changed to a DOAC. Resolving anticoagulation episodes.

## 2023-10-30 DEATH — deceased

## 2023-10-31 ENCOUNTER — Other Ambulatory Visit: Payer: Self-pay | Admitting: Oncology

## 2023-11-20 ENCOUNTER — Other Ambulatory Visit: Payer: Self-pay | Admitting: Family Medicine

## 2023-11-20 DIAGNOSIS — E119 Type 2 diabetes mellitus without complications: Secondary | ICD-10-CM

## 2023-11-21 NOTE — Progress Notes (Signed)
[facility] Discharge Summary   Member has been discharged from [facility]. Please see below Discharge Plan for details. If you have any questions, please contact our Clinical Team at [phone].  Name Christopher Marsh, Christopher Marsh Date of Birth 03/07/1961 Discharge Date: 2023-11-21 Discharged Reason No longer eligible: Deceased Discharging to: Referring Specialist Referring Provider: Laurette Schimke, NP  Current Psychiatric Medications N/A  Referrals: N/A  Discharge Summary Member attended [facility] Intake visit on 09/19/2023. Member's health unfortunately declined soon after and was transitioned to hospice (admitted at [facility] 09/25/2023) before the scheduled [facility] follow-up. See final intake summary below for details from first and only visit. Our consulting psychiatrist made a medication recommendation during Member's transition to hospice: The patient may not wish to make this change, but I noticed that he is taking both trazodone and megace. It might be worthwhile to consider consolidating into one medication such as mirtazapine, which can help with sleep and appetite, in order to lower his overall pill burden. The Rx recommended was mirtazapine 7.5mg  nightly.  Final Intake Summary Christopher Marsh a.k.a. "Christopher Marsh" is a 63 y/o white male with stage IV cholangiocarcinoma first dx'd in 07/2022, referred by Laurette Schimke at [facility]. Christopher Marsh is currently deciding between hospice care and continuing treatment. He is not interested in psychiatric medication recommendations.   Assessments:  [name] scored PHQ-9=4 (minimal), reporting fatigue, poor appetite, and psychomotor retardation. He additionally reported losing his train of thought more often than usual, noticed by others. Christopher Marsh was tearful when reporting his prognosis, "I don't know what to feel; I know my days are numbered." [name] scored GAD-7=1 (minimal), reporting trouble relaxing on several days. Christopher Marsh denies anxiety or fear around death & dying. He does endorse  overwhelm around medications, all the medical appts, and navigating the unknown.   Psychosocial Hx: Christopher Marsh lives at home with his spouse [name]. He has 2 sons in their 27's who live nearby, as does his mother. Christopher Marsh has worked in [employer] and is presently working, [employer] to retire end of Dec. Christopher Marsh is a big sports fan and had season tickets to the [employer] for 8 years throughout his sons' childhoods.   Assigned F43.20 adjustment disorder unspecified diagnosis.   [place] [facility] Provider: Marissa [facility] Manager
